# Patient Record
Sex: Male | Born: 1935 | Race: White | Hispanic: No | Marital: Married | Smoking: Former smoker
Health system: Southern US, Community
[De-identification: ages and names within clinical notes are randomized; demographics above are authoritative.]

## PROBLEM LIST (undated history)

## (undated) DIAGNOSIS — K449 Diaphragmatic hernia without obstruction or gangrene: Secondary | ICD-10-CM

## (undated) DIAGNOSIS — G4733 Obstructive sleep apnea (adult) (pediatric): Secondary | ICD-10-CM

## (undated) DIAGNOSIS — I509 Heart failure, unspecified: Secondary | ICD-10-CM

## (undated) DIAGNOSIS — I10 Essential (primary) hypertension: Secondary | ICD-10-CM

## (undated) DIAGNOSIS — M1990 Unspecified osteoarthritis, unspecified site: Secondary | ICD-10-CM

## (undated) DIAGNOSIS — R911 Solitary pulmonary nodule: Secondary | ICD-10-CM

## (undated) DIAGNOSIS — I712 Thoracic aortic aneurysm, without rupture: Secondary | ICD-10-CM

## (undated) DIAGNOSIS — I255 Ischemic cardiomyopathy: Secondary | ICD-10-CM

## (undated) DIAGNOSIS — N400 Enlarged prostate without lower urinary tract symptoms: Secondary | ICD-10-CM

## (undated) DIAGNOSIS — I480 Paroxysmal atrial fibrillation: Secondary | ICD-10-CM

## (undated) DIAGNOSIS — K5790 Diverticulosis of intestine, part unspecified, without perforation or abscess without bleeding: Secondary | ICD-10-CM

## (undated) DIAGNOSIS — J449 Chronic obstructive pulmonary disease, unspecified: Secondary | ICD-10-CM

## (undated) DIAGNOSIS — E785 Hyperlipidemia, unspecified: Secondary | ICD-10-CM

## (undated) DIAGNOSIS — D509 Iron deficiency anemia, unspecified: Secondary | ICD-10-CM

## (undated) DIAGNOSIS — I959 Hypotension, unspecified: Secondary | ICD-10-CM

## (undated) DIAGNOSIS — I219 Acute myocardial infarction, unspecified: Secondary | ICD-10-CM

## (undated) DIAGNOSIS — Z95810 Presence of automatic (implantable) cardiac defibrillator: Secondary | ICD-10-CM

## (undated) DIAGNOSIS — J189 Pneumonia, unspecified organism: Secondary | ICD-10-CM

## (undated) DIAGNOSIS — I724 Aneurysm of artery of lower extremity: Secondary | ICD-10-CM

## (undated) DIAGNOSIS — C61 Malignant neoplasm of prostate: Secondary | ICD-10-CM

## (undated) DIAGNOSIS — B029 Zoster without complications: Secondary | ICD-10-CM

## (undated) DIAGNOSIS — I2510 Atherosclerotic heart disease of native coronary artery without angina pectoris: Secondary | ICD-10-CM

## (undated) DIAGNOSIS — G5602 Carpal tunnel syndrome, left upper limb: Secondary | ICD-10-CM

## (undated) DIAGNOSIS — R0602 Shortness of breath: Secondary | ICD-10-CM

## (undated) DIAGNOSIS — D649 Anemia, unspecified: Secondary | ICD-10-CM

## (undated) DIAGNOSIS — E039 Hypothyroidism, unspecified: Secondary | ICD-10-CM

## (undated) DIAGNOSIS — N189 Chronic kidney disease, unspecified: Secondary | ICD-10-CM

## (undated) DIAGNOSIS — I714 Abdominal aortic aneurysm, without rupture, unspecified: Secondary | ICD-10-CM

## (undated) DIAGNOSIS — I4891 Unspecified atrial fibrillation: Secondary | ICD-10-CM

## (undated) DIAGNOSIS — K219 Gastro-esophageal reflux disease without esophagitis: Secondary | ICD-10-CM

## (undated) DIAGNOSIS — I739 Peripheral vascular disease, unspecified: Secondary | ICD-10-CM

## (undated) HISTORY — DX: Thoracic aortic aneurysm, without rupture: I71.2

## (undated) HISTORY — DX: Paroxysmal atrial fibrillation: I48.0

## (undated) HISTORY — DX: Gastro-esophageal reflux disease without esophagitis: K21.9

## (undated) HISTORY — DX: Benign prostatic hyperplasia without lower urinary tract symptoms: N40.0

## (undated) HISTORY — DX: Chronic obstructive pulmonary disease, unspecified: J44.9

## (undated) HISTORY — DX: Anemia, unspecified: D64.9

## (undated) HISTORY — DX: Zoster without complications: B02.9

## (undated) HISTORY — DX: Carpal tunnel syndrome, left upper limb: G56.02

## (undated) HISTORY — PX: OTHER SURGICAL HISTORY: SHX169

## (undated) HISTORY — DX: Chronic kidney disease, unspecified: N18.9

## (undated) HISTORY — DX: Peripheral vascular disease, unspecified: I73.9

## (undated) HISTORY — DX: Iron deficiency anemia, unspecified: D50.9

## (undated) HISTORY — DX: Essential (primary) hypertension: I10

## (undated) HISTORY — PX: NOSE SURGERY: SHX723

## (undated) HISTORY — DX: Diaphragmatic hernia without obstruction or gangrene: K44.9

## (undated) HISTORY — DX: Presence of automatic (implantable) cardiac defibrillator: Z95.810

## (undated) HISTORY — DX: Acute myocardial infarction, unspecified: I21.9

## (undated) HISTORY — DX: Abdominal aortic aneurysm, without rupture, unspecified: I71.40

## (undated) HISTORY — DX: Ischemic cardiomyopathy: I25.5

## (undated) HISTORY — DX: Obstructive sleep apnea (adult) (pediatric): G47.33

## (undated) HISTORY — DX: Aneurysm of artery of lower extremity: I72.4

## (undated) HISTORY — DX: Diverticulosis of intestine, part unspecified, without perforation or abscess without bleeding: K57.90

## (undated) HISTORY — DX: Hyperlipidemia, unspecified: E78.5

## (undated) HISTORY — PX: CORONARY ANGIOPLASTY: SHX604

## (undated) HISTORY — DX: Pneumonia, unspecified organism: J18.9

## (undated) HISTORY — DX: Solitary pulmonary nodule: R91.1

## (undated) HISTORY — DX: Atherosclerotic heart disease of native coronary artery without angina pectoris: I25.10

## (undated) HISTORY — DX: Malignant neoplasm of prostate: C61

## (undated) HISTORY — DX: Hypotension, unspecified: I95.9

## (undated) HISTORY — DX: Abdominal aortic aneurysm, without rupture: I71.4

---

## 1993-05-13 DIAGNOSIS — I712 Thoracic aortic aneurysm, without rupture, unspecified: Secondary | ICD-10-CM

## 1993-05-13 HISTORY — DX: Thoracic aortic aneurysm, without rupture: I71.2

## 1993-05-13 HISTORY — DX: Thoracic aortic aneurysm, without rupture, unspecified: I71.20

## 1993-05-13 HISTORY — PX: COLON SURGERY: SHX602

## 1994-05-13 HISTORY — PX: CORONARY ARTERY BYPASS GRAFT: SHX141

## 1997-05-13 HISTORY — PX: AORTA - BILATERAL FEMORAL ARTERY BYPASS GRAFT: SHX1175

## 1997-05-13 HISTORY — PX: ABDOMINAL AORTIC ANEURYSM REPAIR: SUR1152

## 1998-06-09 ENCOUNTER — Encounter: Payer: Self-pay | Admitting: Vascular Surgery

## 1998-06-09 ENCOUNTER — Ambulatory Visit (HOSPITAL_COMMUNITY): Admission: RE | Admit: 1998-06-09 | Discharge: 1998-06-09 | Payer: Self-pay | Admitting: Vascular Surgery

## 1998-07-03 ENCOUNTER — Encounter: Payer: Self-pay | Admitting: Vascular Surgery

## 1998-07-04 ENCOUNTER — Ambulatory Visit: Admission: RE | Admit: 1998-07-04 | Discharge: 1998-07-04 | Payer: Self-pay | Admitting: Vascular Surgery

## 1998-08-01 ENCOUNTER — Inpatient Hospital Stay: Admission: RE | Admit: 1998-08-01 | Discharge: 1998-08-06 | Payer: Self-pay | Admitting: Vascular Surgery

## 1998-08-01 ENCOUNTER — Encounter: Payer: Self-pay | Admitting: Vascular Surgery

## 1998-08-02 ENCOUNTER — Encounter: Payer: Self-pay | Admitting: Vascular Surgery

## 1999-11-10 ENCOUNTER — Encounter: Payer: Self-pay | Admitting: *Deleted

## 1999-11-10 ENCOUNTER — Inpatient Hospital Stay (HOSPITAL_COMMUNITY): Admission: EM | Admit: 1999-11-10 | Discharge: 1999-11-14 | Payer: Self-pay | Admitting: *Deleted

## 2001-01-01 ENCOUNTER — Inpatient Hospital Stay (HOSPITAL_COMMUNITY): Admission: EM | Admit: 2001-01-01 | Discharge: 2001-01-02 | Payer: Self-pay

## 2001-01-02 ENCOUNTER — Encounter: Payer: Self-pay | Admitting: Cardiovascular Disease

## 2001-08-08 ENCOUNTER — Encounter: Payer: Self-pay | Admitting: Emergency Medicine

## 2001-08-08 ENCOUNTER — Emergency Department (HOSPITAL_COMMUNITY): Admission: EM | Admit: 2001-08-08 | Discharge: 2001-08-08 | Payer: Self-pay

## 2004-05-13 HISTORY — PX: RADIOFREQUENCY ABLATION: SHX2290

## 2004-07-30 ENCOUNTER — Ambulatory Visit: Payer: Self-pay | Admitting: Cardiology

## 2004-07-30 ENCOUNTER — Inpatient Hospital Stay (HOSPITAL_COMMUNITY): Admission: EM | Admit: 2004-07-30 | Discharge: 2004-08-02 | Payer: Self-pay | Admitting: Emergency Medicine

## 2004-07-31 ENCOUNTER — Encounter: Payer: Self-pay | Admitting: Cardiology

## 2004-08-10 ENCOUNTER — Ambulatory Visit: Payer: Self-pay | Admitting: Cardiology

## 2004-08-10 ENCOUNTER — Inpatient Hospital Stay (HOSPITAL_COMMUNITY): Admission: AD | Admit: 2004-08-10 | Discharge: 2004-08-14 | Payer: Self-pay | Admitting: Cardiology

## 2004-08-27 ENCOUNTER — Inpatient Hospital Stay (HOSPITAL_COMMUNITY): Admission: EM | Admit: 2004-08-27 | Discharge: 2004-08-29 | Payer: Self-pay | Admitting: Emergency Medicine

## 2004-08-27 ENCOUNTER — Ambulatory Visit: Payer: Self-pay | Admitting: *Deleted

## 2004-08-28 HISTORY — PX: CARDIAC DEFIBRILLATOR PLACEMENT: SHX171

## 2004-09-13 ENCOUNTER — Ambulatory Visit: Payer: Self-pay | Admitting: Cardiology

## 2004-11-28 ENCOUNTER — Ambulatory Visit: Payer: Self-pay | Admitting: Internal Medicine

## 2004-12-03 ENCOUNTER — Ambulatory Visit: Payer: Self-pay | Admitting: Cardiology

## 2004-12-12 ENCOUNTER — Ambulatory Visit: Payer: Self-pay

## 2005-03-14 ENCOUNTER — Ambulatory Visit: Payer: Self-pay | Admitting: Cardiology

## 2005-04-08 ENCOUNTER — Ambulatory Visit: Payer: Self-pay | Admitting: Internal Medicine

## 2005-07-08 ENCOUNTER — Ambulatory Visit: Payer: Self-pay | Admitting: Cardiology

## 2005-07-30 ENCOUNTER — Ambulatory Visit: Payer: Self-pay | Admitting: Internal Medicine

## 2005-11-05 ENCOUNTER — Ambulatory Visit: Payer: Self-pay | Admitting: Internal Medicine

## 2006-01-16 ENCOUNTER — Ambulatory Visit: Payer: Self-pay | Admitting: Cardiology

## 2006-02-05 ENCOUNTER — Ambulatory Visit: Payer: Self-pay

## 2006-02-06 ENCOUNTER — Ambulatory Visit: Payer: Self-pay | Admitting: Internal Medicine

## 2006-03-14 ENCOUNTER — Ambulatory Visit: Payer: Self-pay

## 2006-07-16 ENCOUNTER — Ambulatory Visit: Payer: Self-pay | Admitting: Cardiology

## 2006-11-17 ENCOUNTER — Ambulatory Visit: Payer: Self-pay | Admitting: Vascular Surgery

## 2007-01-14 ENCOUNTER — Ambulatory Visit: Payer: Self-pay | Admitting: Cardiology

## 2007-02-19 ENCOUNTER — Ambulatory Visit: Payer: Self-pay | Admitting: Internal Medicine

## 2007-04-21 ENCOUNTER — Ambulatory Visit: Payer: Self-pay | Admitting: Internal Medicine

## 2007-05-14 DIAGNOSIS — J189 Pneumonia, unspecified organism: Secondary | ICD-10-CM

## 2007-05-14 HISTORY — DX: Pneumonia, unspecified organism: J18.9

## 2007-05-25 ENCOUNTER — Ambulatory Visit: Payer: Self-pay | Admitting: Internal Medicine

## 2007-05-25 ENCOUNTER — Inpatient Hospital Stay (HOSPITAL_COMMUNITY): Admission: EM | Admit: 2007-05-25 | Discharge: 2007-05-30 | Payer: Self-pay | Admitting: Emergency Medicine

## 2007-05-27 ENCOUNTER — Encounter: Payer: Self-pay | Admitting: Internal Medicine

## 2007-06-03 ENCOUNTER — Ambulatory Visit: Payer: Self-pay

## 2007-06-03 LAB — CONVERTED CEMR LAB
BUN: 26 mg/dL — ABNORMAL HIGH (ref 6–23)
Basophils Absolute: 0 10*3/uL (ref 0.0–0.1)
Basophils Relative: 0 % (ref 0.0–1.0)
CO2: 32 meq/L (ref 19–32)
Calcium: 9.4 mg/dL (ref 8.4–10.5)
GFR calc Af Amer: 60 mL/min
GFR calc non Af Amer: 49 mL/min
Lymphocytes Relative: 14.9 % (ref 12.0–46.0)
MCHC: 34.5 g/dL (ref 30.0–36.0)
Monocytes Relative: 9.3 % (ref 3.0–11.0)
Neutro Abs: 5.1 10*3/uL (ref 1.4–7.7)
Platelets: 203 10*3/uL (ref 150–400)
Potassium: 4.4 meq/L (ref 3.5–5.1)

## 2007-06-05 ENCOUNTER — Ambulatory Visit: Payer: Self-pay | Admitting: Internal Medicine

## 2007-06-12 ENCOUNTER — Ambulatory Visit: Payer: Self-pay | Admitting: Cardiology

## 2007-06-16 ENCOUNTER — Ambulatory Visit: Payer: Self-pay | Admitting: Cardiology

## 2007-06-16 LAB — CONVERTED CEMR LAB
BUN: 14 mg/dL (ref 6–23)
Basophils Relative: 0.1 % (ref 0.0–1.0)
CO2: 30 meq/L (ref 19–32)
Calcium: 9.1 mg/dL (ref 8.4–10.5)
Chloride: 105 meq/L (ref 96–112)
Creatinine, Ser: 1.3 mg/dL (ref 0.4–1.5)
Eosinophils Relative: 2.4 % (ref 0.0–5.0)
GFR calc Af Amer: 70 mL/min
Glucose, Bld: 147 mg/dL — ABNORMAL HIGH (ref 70–99)
INR: 2.8 — ABNORMAL HIGH (ref 0.8–1.0)
Lymphocytes Relative: 17.8 % (ref 12.0–46.0)
Monocytes Relative: 7.7 % (ref 3.0–11.0)
Platelets: 122 10*3/uL — ABNORMAL LOW (ref 150–400)
Prothrombin Time: 21.1 s — ABNORMAL HIGH (ref 10.9–13.3)
RBC: 3.84 M/uL — ABNORMAL LOW (ref 4.22–5.81)
RDW: 13.8 % (ref 11.5–14.6)
WBC: 4.3 10*3/uL — ABNORMAL LOW (ref 4.5–10.5)

## 2007-06-18 ENCOUNTER — Ambulatory Visit: Payer: Self-pay

## 2007-06-26 ENCOUNTER — Ambulatory Visit: Payer: Self-pay | Admitting: Vascular Surgery

## 2007-06-29 ENCOUNTER — Ambulatory Visit: Payer: Self-pay | Admitting: Cardiology

## 2007-06-29 LAB — CONVERTED CEMR LAB
Basophils Absolute: 0 10*3/uL (ref 0.0–0.1)
HCT: 36.3 % — ABNORMAL LOW (ref 39.0–52.0)
Hemoglobin: 11.9 g/dL — ABNORMAL LOW (ref 13.0–17.0)
Lymphocytes Relative: 17.5 % (ref 12.0–46.0)
MCHC: 32.8 g/dL (ref 30.0–36.0)
Monocytes Absolute: 0.4 10*3/uL (ref 0.2–0.7)
Monocytes Relative: 5.6 % (ref 3.0–11.0)
Neutro Abs: 5 10*3/uL (ref 1.4–7.7)
Neutrophils Relative %: 72.6 % (ref 43.0–77.0)
RDW: 13.6 % (ref 11.5–14.6)

## 2007-07-07 ENCOUNTER — Ambulatory Visit: Payer: Self-pay | Admitting: Cardiology

## 2007-08-10 ENCOUNTER — Ambulatory Visit: Payer: Self-pay | Admitting: Cardiology

## 2007-08-24 ENCOUNTER — Ambulatory Visit: Payer: Self-pay | Admitting: Internal Medicine

## 2007-09-02 ENCOUNTER — Ambulatory Visit: Payer: Self-pay | Admitting: Cardiology

## 2007-09-02 LAB — CONVERTED CEMR LAB
CO2: 34 meq/L — ABNORMAL HIGH (ref 19–32)
Calcium: 9.8 mg/dL (ref 8.4–10.5)
Creatinine, Ser: 1.3 mg/dL (ref 0.4–1.5)
GFR calc Af Amer: 70 mL/min
Glucose, Bld: 125 mg/dL — ABNORMAL HIGH (ref 70–99)
Sodium: 144 meq/L (ref 135–145)

## 2007-09-09 ENCOUNTER — Ambulatory Visit: Payer: Self-pay | Admitting: Cardiovascular Disease

## 2007-09-13 ENCOUNTER — Ambulatory Visit: Payer: Self-pay | Admitting: Pulmonary Disease

## 2007-09-13 ENCOUNTER — Ambulatory Visit: Payer: Self-pay | Admitting: Cardiovascular Disease

## 2007-09-13 ENCOUNTER — Inpatient Hospital Stay (HOSPITAL_COMMUNITY): Admission: EM | Admit: 2007-09-13 | Discharge: 2007-09-16 | Payer: Self-pay | Admitting: Emergency Medicine

## 2007-09-21 ENCOUNTER — Ambulatory Visit: Payer: Self-pay | Admitting: Cardiology

## 2007-09-30 ENCOUNTER — Ambulatory Visit: Payer: Self-pay | Admitting: Cardiology

## 2007-10-07 ENCOUNTER — Ambulatory Visit: Payer: Self-pay | Admitting: Cardiology

## 2007-10-14 ENCOUNTER — Ambulatory Visit: Payer: Self-pay | Admitting: Pulmonary Disease

## 2007-10-14 DIAGNOSIS — J449 Chronic obstructive pulmonary disease, unspecified: Secondary | ICD-10-CM

## 2007-10-14 DIAGNOSIS — I219 Acute myocardial infarction, unspecified: Secondary | ICD-10-CM | POA: Insufficient documentation

## 2007-10-14 DIAGNOSIS — I10 Essential (primary) hypertension: Secondary | ICD-10-CM | POA: Insufficient documentation

## 2007-10-14 DIAGNOSIS — J309 Allergic rhinitis, unspecified: Secondary | ICD-10-CM | POA: Insufficient documentation

## 2007-10-14 DIAGNOSIS — J4489 Other specified chronic obstructive pulmonary disease: Secondary | ICD-10-CM | POA: Insufficient documentation

## 2007-10-14 DIAGNOSIS — E785 Hyperlipidemia, unspecified: Secondary | ICD-10-CM | POA: Insufficient documentation

## 2007-10-28 ENCOUNTER — Ambulatory Visit: Payer: Self-pay | Admitting: Cardiology

## 2007-11-03 ENCOUNTER — Ambulatory Visit: Payer: Self-pay | Admitting: Internal Medicine

## 2007-11-18 ENCOUNTER — Ambulatory Visit: Payer: Self-pay | Admitting: Cardiovascular Disease

## 2007-12-03 ENCOUNTER — Ambulatory Visit: Payer: Self-pay | Admitting: Cardiology

## 2007-12-16 ENCOUNTER — Ambulatory Visit: Payer: Self-pay | Admitting: Internal Medicine

## 2008-01-06 ENCOUNTER — Ambulatory Visit: Payer: Self-pay | Admitting: Cardiology

## 2008-01-06 LAB — CONVERTED CEMR LAB
BUN: 26 mg/dL — ABNORMAL HIGH (ref 6–23)
Calcium: 9.2 mg/dL (ref 8.4–10.5)
GFR calc Af Amer: 65 mL/min
GFR calc non Af Amer: 53 mL/min
Glucose, Bld: 107 mg/dL — ABNORMAL HIGH (ref 70–99)

## 2008-01-13 ENCOUNTER — Ambulatory Visit: Payer: Self-pay | Admitting: Cardiology

## 2008-01-14 ENCOUNTER — Ambulatory Visit: Payer: Self-pay

## 2008-01-21 ENCOUNTER — Ambulatory Visit: Payer: Self-pay

## 2008-02-02 ENCOUNTER — Ambulatory Visit: Payer: Self-pay | Admitting: Cardiology

## 2008-02-24 ENCOUNTER — Ambulatory Visit: Payer: Self-pay | Admitting: Internal Medicine

## 2008-03-08 ENCOUNTER — Telehealth (INDEPENDENT_AMBULATORY_CARE_PROVIDER_SITE_OTHER): Payer: Self-pay | Admitting: *Deleted

## 2008-03-23 ENCOUNTER — Ambulatory Visit: Payer: Self-pay | Admitting: Internal Medicine

## 2008-03-28 ENCOUNTER — Ambulatory Visit: Payer: Self-pay | Admitting: Cardiology

## 2008-04-01 ENCOUNTER — Observation Stay (HOSPITAL_COMMUNITY): Admission: EM | Admit: 2008-04-01 | Discharge: 2008-04-02 | Payer: Self-pay | Admitting: Emergency Medicine

## 2008-04-01 ENCOUNTER — Ambulatory Visit: Payer: Self-pay | Admitting: Cardiology

## 2008-04-11 ENCOUNTER — Ambulatory Visit: Payer: Self-pay | Admitting: Cardiology

## 2008-04-11 ENCOUNTER — Ambulatory Visit: Payer: Self-pay

## 2008-04-11 LAB — CONVERTED CEMR LAB
BUN: 26 mg/dL — ABNORMAL HIGH (ref 6–23)
Calcium: 9.7 mg/dL (ref 8.4–10.5)
Creatinine, Ser: 1.2 mg/dL (ref 0.4–1.5)
GFR calc Af Amer: 77 mL/min
GFR calc non Af Amer: 64 mL/min

## 2008-04-13 ENCOUNTER — Ambulatory Visit: Payer: Self-pay | Admitting: Cardiology

## 2008-04-25 ENCOUNTER — Ambulatory Visit: Payer: Self-pay | Admitting: Cardiology

## 2008-05-09 ENCOUNTER — Ambulatory Visit: Payer: Self-pay | Admitting: Pulmonary Disease

## 2008-05-11 ENCOUNTER — Telehealth: Payer: Self-pay | Admitting: Pulmonary Disease

## 2008-05-11 ENCOUNTER — Ambulatory Visit: Payer: Self-pay | Admitting: Cardiology

## 2008-06-02 ENCOUNTER — Ambulatory Visit: Payer: Self-pay | Admitting: Cardiology

## 2008-06-07 ENCOUNTER — Encounter: Admission: RE | Admit: 2008-06-07 | Discharge: 2008-06-07 | Payer: Self-pay | Admitting: Endocrinology

## 2008-06-13 ENCOUNTER — Encounter (HOSPITAL_COMMUNITY): Admission: RE | Admit: 2008-06-13 | Discharge: 2008-09-11 | Payer: Self-pay | Admitting: Pulmonary Disease

## 2008-06-13 ENCOUNTER — Encounter: Payer: Self-pay | Admitting: Internal Medicine

## 2008-06-29 ENCOUNTER — Ambulatory Visit: Payer: Self-pay | Admitting: Cardiology

## 2008-07-08 ENCOUNTER — Ambulatory Visit: Payer: Self-pay | Admitting: Vascular Surgery

## 2008-07-11 DIAGNOSIS — C61 Malignant neoplasm of prostate: Secondary | ICD-10-CM

## 2008-07-11 HISTORY — DX: Malignant neoplasm of prostate: C61

## 2008-07-21 ENCOUNTER — Ambulatory Visit: Payer: Self-pay | Admitting: Cardiovascular Disease

## 2008-07-27 ENCOUNTER — Ambulatory Visit: Payer: Self-pay | Admitting: Cardiology

## 2008-08-03 ENCOUNTER — Encounter: Payer: Self-pay | Admitting: Pulmonary Disease

## 2008-08-03 ENCOUNTER — Ambulatory Visit: Payer: Self-pay | Admitting: Cardiology

## 2008-08-08 ENCOUNTER — Ambulatory Visit: Payer: Self-pay | Admitting: Internal Medicine

## 2008-08-17 ENCOUNTER — Ambulatory Visit: Payer: Self-pay | Admitting: Internal Medicine

## 2008-08-23 ENCOUNTER — Telehealth (INDEPENDENT_AMBULATORY_CARE_PROVIDER_SITE_OTHER): Payer: Self-pay | Admitting: *Deleted

## 2008-08-31 ENCOUNTER — Ambulatory Visit: Payer: Self-pay | Admitting: Cardiology

## 2008-09-02 DIAGNOSIS — Z95810 Presence of automatic (implantable) cardiac defibrillator: Secondary | ICD-10-CM

## 2008-09-02 DIAGNOSIS — I2589 Other forms of chronic ischemic heart disease: Secondary | ICD-10-CM

## 2008-09-02 DIAGNOSIS — I739 Peripheral vascular disease, unspecified: Secondary | ICD-10-CM

## 2008-09-02 DIAGNOSIS — E119 Type 2 diabetes mellitus without complications: Secondary | ICD-10-CM

## 2008-09-02 DIAGNOSIS — I501 Left ventricular failure: Secondary | ICD-10-CM

## 2008-09-02 HISTORY — DX: Presence of automatic (implantable) cardiac defibrillator: Z95.810

## 2008-09-05 ENCOUNTER — Ambulatory Visit: Payer: Self-pay | Admitting: Cardiology

## 2008-09-05 DIAGNOSIS — I714 Abdominal aortic aneurysm, without rupture, unspecified: Secondary | ICD-10-CM | POA: Insufficient documentation

## 2008-09-06 LAB — CONVERTED CEMR LAB
BUN: 25 mg/dL — ABNORMAL HIGH (ref 6–23)
Calcium: 9.6 mg/dL (ref 8.4–10.5)
Creatinine, Ser: 1.2 mg/dL (ref 0.4–1.5)
GFR calc non Af Amer: 63.53 mL/min (ref >60)
Magnesium: 2.3 mg/dL (ref 1.5–2.5)

## 2008-09-09 ENCOUNTER — Ambulatory Visit: Payer: Self-pay | Admitting: Pulmonary Disease

## 2008-09-12 ENCOUNTER — Encounter (HOSPITAL_COMMUNITY): Admission: RE | Admit: 2008-09-12 | Discharge: 2008-12-11 | Payer: Self-pay | Admitting: Pulmonary Disease

## 2008-09-21 ENCOUNTER — Ambulatory Visit: Payer: Self-pay | Admitting: Cardiology

## 2008-10-02 ENCOUNTER — Emergency Department (HOSPITAL_BASED_OUTPATIENT_CLINIC_OR_DEPARTMENT_OTHER): Admission: EM | Admit: 2008-10-02 | Discharge: 2008-10-02 | Payer: Self-pay | Admitting: Emergency Medicine

## 2008-10-02 ENCOUNTER — Ambulatory Visit: Payer: Self-pay | Admitting: Radiology

## 2008-10-04 ENCOUNTER — Encounter: Admission: RE | Admit: 2008-10-04 | Discharge: 2008-10-04 | Payer: Self-pay | Admitting: Endocrinology

## 2008-10-11 ENCOUNTER — Encounter: Payer: Self-pay | Admitting: *Deleted

## 2008-10-19 ENCOUNTER — Ambulatory Visit: Payer: Self-pay | Admitting: Internal Medicine

## 2008-10-19 LAB — CONVERTED CEMR LAB: POC INR: 1.9

## 2008-10-22 ENCOUNTER — Emergency Department (HOSPITAL_COMMUNITY): Admission: EM | Admit: 2008-10-22 | Discharge: 2008-10-22 | Payer: Self-pay | Admitting: Emergency Medicine

## 2008-10-31 ENCOUNTER — Encounter: Payer: Self-pay | Admitting: Pulmonary Disease

## 2008-11-01 ENCOUNTER — Ambulatory Visit: Payer: Self-pay | Admitting: Internal Medicine

## 2008-11-09 ENCOUNTER — Ambulatory Visit: Payer: Self-pay | Admitting: Cardiology

## 2008-11-09 ENCOUNTER — Encounter (INDEPENDENT_AMBULATORY_CARE_PROVIDER_SITE_OTHER): Payer: Self-pay | Admitting: Pharmacist

## 2008-11-16 ENCOUNTER — Encounter: Payer: Self-pay | Admitting: *Deleted

## 2008-11-21 ENCOUNTER — Telehealth (INDEPENDENT_AMBULATORY_CARE_PROVIDER_SITE_OTHER): Payer: Self-pay | Admitting: *Deleted

## 2008-11-23 ENCOUNTER — Ambulatory Visit: Payer: Self-pay | Admitting: Cardiology

## 2008-11-23 LAB — CONVERTED CEMR LAB: Prothrombin Time: 17.8 s

## 2008-11-24 ENCOUNTER — Telehealth: Payer: Self-pay | Admitting: Pulmonary Disease

## 2008-11-28 ENCOUNTER — Telehealth (INDEPENDENT_AMBULATORY_CARE_PROVIDER_SITE_OTHER): Payer: Self-pay | Admitting: *Deleted

## 2008-12-14 ENCOUNTER — Ambulatory Visit: Payer: Self-pay | Admitting: Cardiology

## 2008-12-14 LAB — CONVERTED CEMR LAB: POC INR: 1.8

## 2008-12-27 ENCOUNTER — Telehealth (INDEPENDENT_AMBULATORY_CARE_PROVIDER_SITE_OTHER): Payer: Self-pay | Admitting: *Deleted

## 2008-12-27 ENCOUNTER — Ambulatory Visit: Payer: Self-pay | Admitting: Pulmonary Disease

## 2008-12-27 ENCOUNTER — Ambulatory Visit: Payer: Self-pay | Admitting: Internal Medicine

## 2008-12-28 ENCOUNTER — Telehealth: Payer: Self-pay | Admitting: Cardiology

## 2008-12-30 ENCOUNTER — Ambulatory Visit: Payer: Self-pay | Admitting: Cardiology

## 2008-12-30 LAB — CONVERTED CEMR LAB
INR: 8.4
POC INR: 6.6
Prothrombin Time: 85.5 s
Prothrombin Time: 85.5 s (ref 9.1–11.7)

## 2009-01-05 ENCOUNTER — Ambulatory Visit: Payer: Self-pay | Admitting: Cardiovascular Disease

## 2009-01-11 ENCOUNTER — Ambulatory Visit: Payer: Self-pay | Admitting: Pulmonary Disease

## 2009-01-11 ENCOUNTER — Ambulatory Visit: Payer: Self-pay | Admitting: Cardiology

## 2009-01-11 ENCOUNTER — Telehealth: Payer: Self-pay | Admitting: Cardiology

## 2009-01-11 DIAGNOSIS — R490 Dysphonia: Secondary | ICD-10-CM | POA: Insufficient documentation

## 2009-01-16 ENCOUNTER — Inpatient Hospital Stay (HOSPITAL_COMMUNITY): Admission: EM | Admit: 2009-01-16 | Discharge: 2009-01-18 | Payer: Self-pay | Admitting: Emergency Medicine

## 2009-01-16 ENCOUNTER — Ambulatory Visit: Payer: Self-pay | Admitting: Internal Medicine

## 2009-01-18 ENCOUNTER — Encounter: Payer: Self-pay | Admitting: Internal Medicine

## 2009-01-19 ENCOUNTER — Telehealth: Payer: Self-pay | Admitting: Pulmonary Disease

## 2009-01-20 ENCOUNTER — Ambulatory Visit: Payer: Self-pay | Admitting: Internal Medicine

## 2009-01-20 LAB — CONVERTED CEMR LAB: POC INR: 1.5

## 2009-01-30 ENCOUNTER — Ambulatory Visit: Payer: Self-pay | Admitting: Cardiology

## 2009-02-02 ENCOUNTER — Ambulatory Visit: Payer: Self-pay

## 2009-02-06 ENCOUNTER — Encounter: Payer: Self-pay | Admitting: Cardiology

## 2009-02-15 ENCOUNTER — Encounter (INDEPENDENT_AMBULATORY_CARE_PROVIDER_SITE_OTHER): Payer: Self-pay | Admitting: *Deleted

## 2009-02-27 ENCOUNTER — Ambulatory Visit: Payer: Self-pay | Admitting: Internal Medicine

## 2009-02-28 ENCOUNTER — Ambulatory Visit: Payer: Self-pay | Admitting: Pulmonary Disease

## 2009-03-21 ENCOUNTER — Telehealth: Payer: Self-pay | Admitting: Pulmonary Disease

## 2009-03-27 ENCOUNTER — Ambulatory Visit: Payer: Self-pay | Admitting: Internal Medicine

## 2009-03-27 LAB — CONVERTED CEMR LAB: POC INR: 2.2

## 2009-03-28 ENCOUNTER — Encounter: Payer: Self-pay | Admitting: Cardiology

## 2009-04-04 ENCOUNTER — Ambulatory Visit: Payer: Self-pay | Admitting: Cardiology

## 2009-04-04 ENCOUNTER — Telehealth: Payer: Self-pay | Admitting: Cardiology

## 2009-04-05 ENCOUNTER — Encounter (INDEPENDENT_AMBULATORY_CARE_PROVIDER_SITE_OTHER): Payer: Self-pay

## 2009-04-05 ENCOUNTER — Telehealth: Payer: Self-pay | Admitting: Cardiology

## 2009-04-05 ENCOUNTER — Encounter: Payer: Self-pay | Admitting: Cardiology

## 2009-04-09 ENCOUNTER — Ambulatory Visit: Payer: Self-pay | Admitting: Internal Medicine

## 2009-04-10 ENCOUNTER — Ambulatory Visit: Payer: Self-pay | Admitting: Cardiology

## 2009-04-10 ENCOUNTER — Encounter: Payer: Self-pay | Admitting: Cardiology

## 2009-04-10 ENCOUNTER — Inpatient Hospital Stay (HOSPITAL_COMMUNITY): Admission: EM | Admit: 2009-04-10 | Discharge: 2009-04-16 | Payer: Self-pay | Admitting: Emergency Medicine

## 2009-04-11 ENCOUNTER — Encounter: Payer: Self-pay | Admitting: Cardiology

## 2009-04-12 ENCOUNTER — Ambulatory Visit: Payer: Self-pay | Admitting: Vascular Surgery

## 2009-04-12 ENCOUNTER — Encounter: Payer: Self-pay | Admitting: Cardiology

## 2009-04-13 ENCOUNTER — Encounter: Payer: Self-pay | Admitting: Internal Medicine

## 2009-04-14 ENCOUNTER — Encounter: Payer: Self-pay | Admitting: Cardiology

## 2009-04-18 ENCOUNTER — Encounter: Payer: Self-pay | Admitting: Internal Medicine

## 2009-04-18 ENCOUNTER — Ambulatory Visit: Payer: Self-pay

## 2009-04-28 ENCOUNTER — Inpatient Hospital Stay (HOSPITAL_COMMUNITY): Admission: EM | Admit: 2009-04-28 | Discharge: 2009-04-29 | Payer: Self-pay | Admitting: Emergency Medicine

## 2009-04-28 ENCOUNTER — Ambulatory Visit: Payer: Self-pay | Admitting: Cardiology

## 2009-04-29 ENCOUNTER — Encounter: Payer: Self-pay | Admitting: Cardiovascular Disease

## 2009-04-29 ENCOUNTER — Encounter: Payer: Self-pay | Admitting: Cardiology

## 2009-05-01 ENCOUNTER — Ambulatory Visit: Payer: Self-pay | Admitting: Pulmonary Disease

## 2009-05-03 ENCOUNTER — Ambulatory Visit: Payer: Self-pay | Admitting: Internal Medicine

## 2009-05-03 LAB — CONVERTED CEMR LAB: POC INR: 3.4

## 2009-05-11 ENCOUNTER — Ambulatory Visit: Payer: Self-pay | Admitting: Cardiovascular Disease

## 2009-05-11 LAB — CONVERTED CEMR LAB: POC INR: 3.4

## 2009-05-15 ENCOUNTER — Encounter: Payer: Self-pay | Admitting: Family Medicine

## 2009-05-15 ENCOUNTER — Ambulatory Visit: Payer: Self-pay | Admitting: Cardiology

## 2009-05-17 ENCOUNTER — Encounter: Payer: Self-pay | Admitting: Cardiology

## 2009-05-18 ENCOUNTER — Encounter: Payer: Self-pay | Admitting: Cardiology

## 2009-05-25 ENCOUNTER — Ambulatory Visit: Payer: Self-pay | Admitting: Cardiology

## 2009-05-29 ENCOUNTER — Encounter: Payer: Self-pay | Admitting: Cardiology

## 2009-06-01 ENCOUNTER — Ambulatory Visit: Payer: Self-pay | Admitting: Pulmonary Disease

## 2009-06-06 ENCOUNTER — Telehealth: Payer: Self-pay | Admitting: Cardiology

## 2009-06-06 ENCOUNTER — Inpatient Hospital Stay (HOSPITAL_COMMUNITY): Admission: EM | Admit: 2009-06-06 | Discharge: 2009-06-07 | Payer: Self-pay | Admitting: Emergency Medicine

## 2009-06-06 ENCOUNTER — Ambulatory Visit: Payer: Self-pay | Admitting: Cardiology

## 2009-06-09 ENCOUNTER — Ambulatory Visit: Payer: Self-pay | Admitting: Cardiology

## 2009-06-09 DIAGNOSIS — I712 Thoracic aortic aneurysm, without rupture, unspecified: Secondary | ICD-10-CM | POA: Insufficient documentation

## 2009-06-14 ENCOUNTER — Ambulatory Visit (HOSPITAL_BASED_OUTPATIENT_CLINIC_OR_DEPARTMENT_OTHER): Admission: RE | Admit: 2009-06-14 | Discharge: 2009-06-14 | Payer: Self-pay | Admitting: Pulmonary Disease

## 2009-06-14 ENCOUNTER — Encounter: Payer: Self-pay | Admitting: Pulmonary Disease

## 2009-06-14 DIAGNOSIS — G4733 Obstructive sleep apnea (adult) (pediatric): Secondary | ICD-10-CM

## 2009-06-14 HISTORY — DX: Obstructive sleep apnea (adult) (pediatric): G47.33

## 2009-06-20 ENCOUNTER — Ambulatory Visit: Payer: Self-pay | Admitting: Cardiology

## 2009-06-22 ENCOUNTER — Ambulatory Visit: Payer: Self-pay | Admitting: Cardiology

## 2009-06-22 ENCOUNTER — Encounter (INDEPENDENT_AMBULATORY_CARE_PROVIDER_SITE_OTHER): Payer: Self-pay | Admitting: Cardiology

## 2009-06-22 LAB — CONVERTED CEMR LAB: POC INR: 2.2

## 2009-06-29 ENCOUNTER — Telehealth (INDEPENDENT_AMBULATORY_CARE_PROVIDER_SITE_OTHER): Payer: Self-pay | Admitting: *Deleted

## 2009-06-29 ENCOUNTER — Ambulatory Visit: Payer: Self-pay | Admitting: Pulmonary Disease

## 2009-06-30 ENCOUNTER — Encounter: Payer: Self-pay | Admitting: Cardiology

## 2009-07-04 ENCOUNTER — Ambulatory Visit: Payer: Self-pay | Admitting: Pulmonary Disease

## 2009-07-11 ENCOUNTER — Encounter: Admission: RE | Admit: 2009-07-11 | Discharge: 2009-07-11 | Payer: Self-pay | Admitting: Endocrinology

## 2009-07-13 ENCOUNTER — Telehealth (INDEPENDENT_AMBULATORY_CARE_PROVIDER_SITE_OTHER): Payer: Self-pay | Admitting: *Deleted

## 2009-07-20 ENCOUNTER — Ambulatory Visit: Payer: Self-pay | Admitting: Cardiology

## 2009-07-20 ENCOUNTER — Ambulatory Visit: Payer: Self-pay

## 2009-07-20 LAB — CONVERTED CEMR LAB: POC INR: 3.6

## 2009-07-21 ENCOUNTER — Encounter: Payer: Self-pay | Admitting: Cardiology

## 2009-07-25 ENCOUNTER — Encounter: Payer: Self-pay | Admitting: Pulmonary Disease

## 2009-07-25 ENCOUNTER — Telehealth (INDEPENDENT_AMBULATORY_CARE_PROVIDER_SITE_OTHER): Payer: Self-pay | Admitting: *Deleted

## 2009-07-31 ENCOUNTER — Telehealth (INDEPENDENT_AMBULATORY_CARE_PROVIDER_SITE_OTHER): Payer: Self-pay | Admitting: *Deleted

## 2009-07-31 ENCOUNTER — Encounter: Payer: Self-pay | Admitting: Cardiology

## 2009-08-01 ENCOUNTER — Ambulatory Visit: Payer: Self-pay | Admitting: Pulmonary Disease

## 2009-08-03 ENCOUNTER — Telehealth (INDEPENDENT_AMBULATORY_CARE_PROVIDER_SITE_OTHER): Payer: Self-pay | Admitting: *Deleted

## 2009-08-07 ENCOUNTER — Encounter: Payer: Self-pay | Admitting: Family Medicine

## 2009-08-09 ENCOUNTER — Ambulatory Visit: Payer: Self-pay

## 2009-08-09 ENCOUNTER — Ambulatory Visit: Payer: Self-pay | Admitting: Cardiology

## 2009-08-11 ENCOUNTER — Ambulatory Visit: Payer: Self-pay | Admitting: Vascular Surgery

## 2009-08-11 ENCOUNTER — Encounter: Admission: RE | Admit: 2009-08-11 | Discharge: 2009-08-11 | Payer: Self-pay | Admitting: Vascular Surgery

## 2009-08-14 ENCOUNTER — Telehealth (INDEPENDENT_AMBULATORY_CARE_PROVIDER_SITE_OTHER): Payer: Self-pay | Admitting: *Deleted

## 2009-08-31 ENCOUNTER — Encounter: Payer: Self-pay | Admitting: Cardiology

## 2009-09-04 ENCOUNTER — Encounter: Payer: Self-pay | Admitting: Pulmonary Disease

## 2009-09-05 ENCOUNTER — Encounter: Payer: Self-pay | Admitting: Pulmonary Disease

## 2009-09-06 ENCOUNTER — Encounter: Payer: Self-pay | Admitting: Cardiology

## 2009-09-07 ENCOUNTER — Encounter: Payer: Self-pay | Admitting: Cardiology

## 2009-09-20 ENCOUNTER — Encounter: Payer: Self-pay | Admitting: Pulmonary Disease

## 2009-09-29 ENCOUNTER — Encounter (INDEPENDENT_AMBULATORY_CARE_PROVIDER_SITE_OTHER): Payer: Self-pay | Admitting: Pharmacist

## 2009-09-29 ENCOUNTER — Telehealth: Payer: Self-pay | Admitting: Cardiology

## 2009-10-01 ENCOUNTER — Encounter: Payer: Self-pay | Admitting: Cardiology

## 2009-10-02 ENCOUNTER — Ambulatory Visit: Payer: Self-pay | Admitting: Pulmonary Disease

## 2009-10-04 ENCOUNTER — Ambulatory Visit: Payer: Self-pay | Admitting: Internal Medicine

## 2009-10-16 ENCOUNTER — Ambulatory Visit: Payer: Self-pay

## 2009-10-30 ENCOUNTER — Encounter: Admission: RE | Admit: 2009-10-30 | Discharge: 2009-10-30 | Payer: Self-pay | Admitting: Endocrinology

## 2009-11-01 ENCOUNTER — Ambulatory Visit: Payer: Self-pay | Admitting: Cardiovascular Disease

## 2009-11-01 ENCOUNTER — Encounter: Payer: Self-pay | Admitting: Cardiology

## 2009-11-01 ENCOUNTER — Encounter (INDEPENDENT_AMBULATORY_CARE_PROVIDER_SITE_OTHER): Payer: Self-pay | Admitting: *Deleted

## 2009-11-01 ENCOUNTER — Inpatient Hospital Stay (HOSPITAL_COMMUNITY): Admission: EM | Admit: 2009-11-01 | Discharge: 2009-11-10 | Payer: Self-pay | Admitting: Emergency Medicine

## 2009-11-08 ENCOUNTER — Encounter: Payer: Self-pay | Admitting: Cardiology

## 2009-11-09 ENCOUNTER — Encounter: Payer: Self-pay | Admitting: Cardiology

## 2009-11-14 ENCOUNTER — Emergency Department (HOSPITAL_BASED_OUTPATIENT_CLINIC_OR_DEPARTMENT_OTHER): Admission: EM | Admit: 2009-11-14 | Discharge: 2009-11-14 | Payer: Self-pay | Admitting: Emergency Medicine

## 2009-11-15 ENCOUNTER — Encounter: Payer: Self-pay | Admitting: Cardiology

## 2009-11-17 ENCOUNTER — Ambulatory Visit: Payer: Self-pay | Admitting: Cardiology

## 2009-11-17 LAB — CONVERTED CEMR LAB: POC INR: 1.7

## 2009-11-21 ENCOUNTER — Ambulatory Visit: Payer: Self-pay | Admitting: Cardiology

## 2009-12-01 ENCOUNTER — Telehealth (INDEPENDENT_AMBULATORY_CARE_PROVIDER_SITE_OTHER): Payer: Self-pay | Admitting: *Deleted

## 2009-12-01 ENCOUNTER — Ambulatory Visit: Payer: Self-pay | Admitting: Cardiology

## 2009-12-01 LAB — CONVERTED CEMR LAB: POC INR: 1.8

## 2009-12-02 ENCOUNTER — Encounter: Payer: Self-pay | Admitting: Cardiology

## 2009-12-05 LAB — CONVERTED CEMR LAB
BUN: 26 mg/dL — ABNORMAL HIGH (ref 6–23)
CO2: 30 meq/L (ref 19–32)
Calcium: 9.1 mg/dL (ref 8.4–10.5)
Chloride: 102 meq/L (ref 96–112)
Creatinine, Ser: 1.5 mg/dL (ref 0.4–1.5)
GFR calc non Af Amer: 48.94 mL/min (ref >60)
Glucose, Bld: 77 mg/dL (ref 70–99)
Potassium: 3.9 meq/L (ref 3.5–5.1)
Sodium: 141 meq/L (ref 135–145)

## 2009-12-14 ENCOUNTER — Ambulatory Visit: Payer: Self-pay | Admitting: Cardiovascular Disease

## 2009-12-14 LAB — CONVERTED CEMR LAB: POC INR: 2.7

## 2009-12-19 ENCOUNTER — Encounter: Payer: Self-pay | Admitting: Family Medicine

## 2009-12-29 ENCOUNTER — Ambulatory Visit: Payer: Self-pay | Admitting: Cardiology

## 2010-01-01 ENCOUNTER — Ambulatory Visit: Payer: Self-pay | Admitting: Pulmonary Disease

## 2010-01-01 ENCOUNTER — Telehealth: Payer: Self-pay | Admitting: Cardiology

## 2010-01-01 ENCOUNTER — Encounter: Payer: Self-pay | Admitting: Cardiology

## 2010-01-04 ENCOUNTER — Ambulatory Visit: Payer: Self-pay | Admitting: Internal Medicine

## 2010-01-09 ENCOUNTER — Ambulatory Visit: Payer: Self-pay | Admitting: Cardiovascular Disease

## 2010-01-11 LAB — CONVERTED CEMR LAB
CO2: 32 meq/L (ref 19–32)
Chloride: 102 meq/L (ref 96–112)
Creatinine, Ser: 1.5 mg/dL (ref 0.4–1.5)
Potassium: 3.9 meq/L (ref 3.5–5.1)
Sodium: 142 meq/L (ref 135–145)

## 2010-01-12 ENCOUNTER — Telehealth (INDEPENDENT_AMBULATORY_CARE_PROVIDER_SITE_OTHER): Payer: Self-pay | Admitting: *Deleted

## 2010-01-17 ENCOUNTER — Ambulatory Visit: Payer: Self-pay

## 2010-01-17 ENCOUNTER — Telehealth (INDEPENDENT_AMBULATORY_CARE_PROVIDER_SITE_OTHER): Payer: Self-pay | Admitting: *Deleted

## 2010-01-17 ENCOUNTER — Encounter: Payer: Self-pay | Admitting: Pulmonary Disease

## 2010-01-19 ENCOUNTER — Telehealth: Payer: Self-pay | Admitting: Pulmonary Disease

## 2010-02-01 ENCOUNTER — Ambulatory Visit: Payer: Self-pay | Admitting: Cardiology

## 2010-02-01 ENCOUNTER — Encounter: Payer: Self-pay | Admitting: Cardiology

## 2010-02-05 ENCOUNTER — Telehealth (INDEPENDENT_AMBULATORY_CARE_PROVIDER_SITE_OTHER): Payer: Self-pay | Admitting: *Deleted

## 2010-02-08 ENCOUNTER — Ambulatory Visit: Payer: Self-pay | Admitting: Cardiology

## 2010-02-08 LAB — CONVERTED CEMR LAB: POC INR: 2.2

## 2010-02-14 ENCOUNTER — Telehealth: Payer: Self-pay | Admitting: Cardiology

## 2010-03-01 ENCOUNTER — Ambulatory Visit: Payer: Self-pay | Admitting: Cardiovascular Disease

## 2010-03-02 ENCOUNTER — Ambulatory Visit: Payer: Self-pay | Admitting: Pulmonary Disease

## 2010-03-02 DIAGNOSIS — R911 Solitary pulmonary nodule: Secondary | ICD-10-CM

## 2010-03-02 HISTORY — DX: Solitary pulmonary nodule: R91.1

## 2010-03-04 ENCOUNTER — Encounter: Payer: Self-pay | Admitting: Cardiology

## 2010-03-08 ENCOUNTER — Ambulatory Visit: Payer: Self-pay | Admitting: Cardiology

## 2010-03-08 LAB — CONVERTED CEMR LAB: POC INR: 2.5

## 2010-03-14 ENCOUNTER — Ambulatory Visit: Payer: Self-pay | Admitting: Cardiology

## 2010-03-15 ENCOUNTER — Telehealth: Payer: Self-pay | Admitting: Cardiology

## 2010-03-15 ENCOUNTER — Encounter: Payer: Self-pay | Admitting: Cardiology

## 2010-03-15 ENCOUNTER — Telehealth (INDEPENDENT_AMBULATORY_CARE_PROVIDER_SITE_OTHER): Payer: Self-pay | Admitting: *Deleted

## 2010-03-19 ENCOUNTER — Telehealth (INDEPENDENT_AMBULATORY_CARE_PROVIDER_SITE_OTHER): Payer: Self-pay | Admitting: *Deleted

## 2010-03-22 ENCOUNTER — Ambulatory Visit: Payer: Self-pay | Admitting: Cardiology

## 2010-03-28 ENCOUNTER — Telehealth: Payer: Self-pay | Admitting: Cardiology

## 2010-03-29 LAB — CONVERTED CEMR LAB
Chloride: 105 meq/L (ref 96–112)
Potassium: 4.7 meq/L (ref 3.5–5.1)
Pro B Natriuretic peptide (BNP): 324.9 pg/mL — ABNORMAL HIGH (ref 0.0–100.0)
Sodium: 140 meq/L (ref 135–145)

## 2010-04-03 ENCOUNTER — Encounter: Payer: Self-pay | Admitting: Cardiology

## 2010-04-03 ENCOUNTER — Ambulatory Visit: Payer: Self-pay | Admitting: Vascular Surgery

## 2010-04-04 ENCOUNTER — Encounter: Payer: Self-pay | Admitting: Cardiology

## 2010-04-09 ENCOUNTER — Ambulatory Visit: Payer: Self-pay | Admitting: Internal Medicine

## 2010-04-09 LAB — CONVERTED CEMR LAB: POC INR: 2.3

## 2010-04-11 ENCOUNTER — Ambulatory Visit: Payer: Self-pay | Admitting: Pulmonary Disease

## 2010-04-11 ENCOUNTER — Telehealth (INDEPENDENT_AMBULATORY_CARE_PROVIDER_SITE_OTHER): Payer: Self-pay | Admitting: *Deleted

## 2010-04-16 ENCOUNTER — Ambulatory Visit: Payer: Self-pay | Admitting: Cardiology

## 2010-04-16 ENCOUNTER — Ambulatory Visit: Payer: Self-pay | Admitting: Family Medicine

## 2010-04-16 ENCOUNTER — Encounter: Payer: Self-pay | Admitting: Cardiology

## 2010-04-18 ENCOUNTER — Encounter: Payer: Self-pay | Admitting: Family Medicine

## 2010-04-24 ENCOUNTER — Ambulatory Visit: Payer: Self-pay | Admitting: Internal Medicine

## 2010-05-04 ENCOUNTER — Encounter: Payer: Self-pay | Admitting: Cardiology

## 2010-05-08 ENCOUNTER — Ambulatory Visit
Admission: RE | Admit: 2010-05-08 | Discharge: 2010-05-08 | Payer: Self-pay | Source: Home / Self Care | Attending: Family Medicine | Admitting: Family Medicine

## 2010-05-08 ENCOUNTER — Ambulatory Visit: Payer: Self-pay | Admitting: Cardiology

## 2010-05-08 DIAGNOSIS — E039 Hypothyroidism, unspecified: Secondary | ICD-10-CM | POA: Insufficient documentation

## 2010-05-08 DIAGNOSIS — D510 Vitamin B12 deficiency anemia due to intrinsic factor deficiency: Secondary | ICD-10-CM

## 2010-05-08 LAB — CONVERTED CEMR LAB: POC INR: 3.1

## 2010-05-11 ENCOUNTER — Ambulatory Visit
Admission: RE | Admit: 2010-05-11 | Discharge: 2010-05-11 | Payer: Self-pay | Source: Home / Self Care | Attending: Family Medicine | Admitting: Family Medicine

## 2010-05-17 ENCOUNTER — Other Ambulatory Visit: Payer: Self-pay | Admitting: Family Medicine

## 2010-05-17 ENCOUNTER — Ambulatory Visit
Admission: RE | Admit: 2010-05-17 | Discharge: 2010-05-17 | Payer: Self-pay | Source: Home / Self Care | Attending: Family Medicine | Admitting: Family Medicine

## 2010-05-17 LAB — CBC WITH DIFFERENTIAL/PLATELET
Basophils Absolute: 0 10*3/uL (ref 0.0–0.1)
Basophils Relative: 0.1 % (ref 0.0–3.0)
Eosinophils Absolute: 0.1 10*3/uL (ref 0.0–0.7)
Eosinophils Relative: 0.9 % (ref 0.0–5.0)
HCT: 35.2 % — ABNORMAL LOW (ref 39.0–52.0)
Hemoglobin: 11.6 g/dL — ABNORMAL LOW (ref 13.0–17.0)
Lymphocytes Relative: 6.7 % — ABNORMAL LOW (ref 12.0–46.0)
Lymphs Abs: 0.7 10*3/uL (ref 0.7–4.0)
MCHC: 32.8 g/dL (ref 30.0–36.0)
MCV: 85.6 fl (ref 78.0–100.0)
Monocytes Absolute: 0.7 10*3/uL (ref 0.1–1.0)
Monocytes Relative: 6.4 % (ref 3.0–12.0)
Neutro Abs: 8.9 10*3/uL — ABNORMAL HIGH (ref 1.4–7.7)
Neutrophils Relative %: 85.9 % — ABNORMAL HIGH (ref 43.0–77.0)
Platelets: 142 10*3/uL — ABNORMAL LOW (ref 150.0–400.0)
RBC: 4.12 Mil/uL — ABNORMAL LOW (ref 4.22–5.81)
RDW: 18.3 % — ABNORMAL HIGH (ref 11.5–14.6)
WBC: 10.4 10*3/uL (ref 4.5–10.5)

## 2010-05-17 LAB — BASIC METABOLIC PANEL
BUN: 22 mg/dL (ref 6–23)
CO2: 31 mEq/L (ref 19–32)
Calcium: 9.2 mg/dL (ref 8.4–10.5)
Chloride: 99 mEq/L (ref 96–112)
Creatinine, Ser: 1.4 mg/dL (ref 0.4–1.5)
GFR: 52.92 mL/min — ABNORMAL LOW (ref >60.00)
Glucose, Bld: 120 mg/dL — ABNORMAL HIGH (ref 70–99)
Potassium: 4.2 mEq/L (ref 3.5–5.1)
Sodium: 136 mEq/L (ref 135–145)

## 2010-05-17 LAB — LIPID PANEL
Cholesterol: 166 mg/dL (ref 0–200)
HDL: 46.1 mg/dL (ref >39.00)
LDL Cholesterol: 101 mg/dL — ABNORMAL HIGH (ref 0–99)
Total CHOL/HDL Ratio: 4
Triglycerides: 95 mg/dL (ref 0.0–149.0)
VLDL: 19 mg/dL (ref 0.0–40.0)

## 2010-05-17 LAB — HEPATIC FUNCTION PANEL
ALT: 19 U/L (ref 0–53)
AST: 15 U/L (ref 0–37)
Albumin: 3.4 g/dL — ABNORMAL LOW (ref 3.5–5.2)
Alkaline Phosphatase: 64 U/L (ref 39–117)
Bilirubin, Direct: 0.2 mg/dL (ref 0.0–0.3)
Total Bilirubin: 1.2 mg/dL (ref 0.3–1.2)
Total Protein: 6.3 g/dL (ref 6.0–8.3)

## 2010-05-17 LAB — HEMOGLOBIN A1C: Hgb A1c MFr Bld: 6.4 % (ref 4.6–6.5)

## 2010-05-18 ENCOUNTER — Emergency Department (HOSPITAL_BASED_OUTPATIENT_CLINIC_OR_DEPARTMENT_OTHER)
Admission: EM | Admit: 2010-05-18 | Discharge: 2010-05-18 | Payer: Self-pay | Source: Home / Self Care | Admitting: Emergency Medicine

## 2010-05-18 LAB — DIFFERENTIAL
Basophils Absolute: 0 10*3/uL (ref 0.0–0.1)
Basophils Relative: 0 % (ref 0–1)
Eosinophils Absolute: 0 10*3/uL (ref 0.0–0.7)
Eosinophils Relative: 0 % (ref 0–5)
Lymphocytes Relative: 5 % — ABNORMAL LOW (ref 12–46)
Lymphs Abs: 0.6 10*3/uL — ABNORMAL LOW (ref 0.7–4.0)
Monocytes Absolute: 0.9 10*3/uL (ref 0.1–1.0)
Monocytes Relative: 8 % (ref 3–12)
Neutro Abs: 10.7 10*3/uL — ABNORMAL HIGH (ref 1.7–7.7)
Neutrophils Relative %: 87 % — ABNORMAL HIGH (ref 43–77)

## 2010-05-18 LAB — POCT CARDIAC MARKERS
CKMB, poc: <1 ng/mL — ABNORMAL LOW (ref 1.0–8.0)
CKMB, poc: <1 ng/mL — ABNORMAL LOW (ref 1.0–8.0)
Myoglobin, poc: 88.4 ng/mL (ref 12–200)
Myoglobin, poc: 81.1 ng/mL (ref 12–200)
Troponin i, poc: <0.05 ng/mL (ref 0.00–0.09)
Troponin i, poc: <0.05 ng/mL (ref 0.00–0.09)

## 2010-05-18 LAB — BASIC METABOLIC PANEL
BUN: 19 mg/dL (ref 6–23)
CO2: 27 mEq/L (ref 19–32)
Calcium: 9.3 mg/dL (ref 8.4–10.5)
Chloride: 102 mEq/L (ref 96–112)
Creatinine, Ser: 1.1 mg/dL (ref 0.4–1.5)
GFR calc Af Amer: >60 mL/min (ref >60)
GFR calc non Af Amer: >60 mL/min (ref >60)
Glucose, Bld: 153 mg/dL — ABNORMAL HIGH (ref 70–99)
Potassium: 4.4 mEq/L (ref 3.5–5.1)
Sodium: 140 mEq/L (ref 135–145)

## 2010-05-18 LAB — CBC
HCT: 35 % — ABNORMAL LOW (ref 39.0–52.0)
Hemoglobin: 11 g/dL — ABNORMAL LOW (ref 13.0–17.0)
MCH: 27 pg (ref 26.0–34.0)
MCHC: 31.4 g/dL (ref 30.0–36.0)
MCV: 85.8 fL (ref 78.0–100.0)
Platelets: 140 10*3/uL — ABNORMAL LOW (ref 150–400)
RBC: 4.08 MIL/uL — ABNORMAL LOW (ref 4.22–5.81)
RDW: 17.8 % — ABNORMAL HIGH (ref 11.5–15.5)
WBC: 12.2 10*3/uL — ABNORMAL HIGH (ref 4.0–10.5)

## 2010-05-18 LAB — PROTIME-INR
INR: 2.25 — ABNORMAL HIGH (ref 0.00–1.49)
Prothrombin Time: 25 seconds — ABNORMAL HIGH (ref 11.6–15.2)

## 2010-05-18 LAB — URINALYSIS, ROUTINE W REFLEX MICROSCOPIC
Bilirubin Urine: NEGATIVE
Hemoglobin, Urine: NEGATIVE
Ketones, ur: NEGATIVE mg/dL
Nitrite: NEGATIVE
Protein, ur: NEGATIVE mg/dL
Specific Gravity, Urine: 1.007 (ref 1.005–1.030)
Urine Glucose, Fasting: NEGATIVE mg/dL
Urobilinogen, UA: 0.2 mg/dL (ref 0.0–1.0)
pH: 7.5 (ref 5.0–8.0)

## 2010-05-18 LAB — POCT B-TYPE NATRIURETIC PEPTIDE (BNP): B Natriuretic Peptide, POC: 541 pg/mL — ABNORMAL HIGH (ref 0–100)

## 2010-05-18 LAB — APTT: aPTT: 53 seconds — ABNORMAL HIGH (ref 24–37)

## 2010-05-21 ENCOUNTER — Ambulatory Visit
Admission: RE | Admit: 2010-05-21 | Discharge: 2010-05-21 | Payer: Self-pay | Source: Home / Self Care | Attending: Pulmonary Disease | Admitting: Pulmonary Disease

## 2010-05-21 ENCOUNTER — Telehealth: Payer: Self-pay | Admitting: Family Medicine

## 2010-05-21 LAB — TSH: TSH: 0.5 u[IU]/mL (ref 0.35–5.50)

## 2010-05-30 ENCOUNTER — Encounter: Payer: Self-pay | Admitting: Family Medicine

## 2010-06-01 ENCOUNTER — Other Ambulatory Visit: Payer: Self-pay | Admitting: Family Medicine

## 2010-06-01 ENCOUNTER — Ambulatory Visit
Admission: RE | Admit: 2010-06-01 | Discharge: 2010-06-01 | Payer: Self-pay | Source: Home / Self Care | Attending: Family Medicine | Admitting: Family Medicine

## 2010-06-01 ENCOUNTER — Ambulatory Visit: Admission: RE | Admit: 2010-06-01 | Discharge: 2010-06-01 | Payer: Self-pay | Source: Home / Self Care

## 2010-06-01 ENCOUNTER — Telehealth: Payer: Self-pay | Admitting: Family Medicine

## 2010-06-01 DIAGNOSIS — D649 Anemia, unspecified: Secondary | ICD-10-CM | POA: Insufficient documentation

## 2010-06-01 HISTORY — DX: Anemia, unspecified: D64.9

## 2010-06-01 LAB — IBC PANEL
Iron: 66 ug/dL (ref 42–165)
Saturation Ratios: 16.8 % — ABNORMAL LOW (ref 20.0–50.0)
Transferrin: 280.1 mg/dL (ref 212.0–360.0)

## 2010-06-01 LAB — HEPATIC FUNCTION PANEL
ALT: 22 U/L (ref 0–53)
AST: 19 U/L (ref 0–37)
Albumin: 3.5 g/dL (ref 3.5–5.2)
Alkaline Phosphatase: 58 U/L (ref 39–117)
Bilirubin, Direct: 0.2 mg/dL (ref 0.0–0.3)
Total Bilirubin: 0.6 mg/dL (ref 0.3–1.2)
Total Protein: 6.3 g/dL (ref 6.0–8.3)

## 2010-06-01 LAB — CBC WITH DIFFERENTIAL/PLATELET
Basophils Absolute: 0 10*3/uL (ref 0.0–0.1)
Basophils Relative: 0.3 % (ref 0.0–3.0)
Eosinophils Absolute: 0.1 10*3/uL (ref 0.0–0.7)
Eosinophils Relative: 1.1 % (ref 0.0–5.0)
HCT: 35.1 % — ABNORMAL LOW (ref 39.0–52.0)
Hemoglobin: 11.9 g/dL — ABNORMAL LOW (ref 13.0–17.0)
Lymphocytes Relative: 11.4 % — ABNORMAL LOW (ref 12.0–46.0)
Lymphs Abs: 0.9 10*3/uL (ref 0.7–4.0)
MCHC: 33.8 g/dL (ref 30.0–36.0)
MCV: 86.3 fl (ref 78.0–100.0)
Monocytes Absolute: 0.4 10*3/uL (ref 0.1–1.0)
Monocytes Relative: 5.4 % (ref 3.0–12.0)
Neutro Abs: 6.5 10*3/uL (ref 1.4–7.7)
Neutrophils Relative %: 81.8 % — ABNORMAL HIGH (ref 43.0–77.0)
Platelets: 144 10*3/uL — ABNORMAL LOW (ref 150.0–400.0)
RBC: 4.07 Mil/uL — ABNORMAL LOW (ref 4.22–5.81)
RDW: 17.9 % — ABNORMAL HIGH (ref 11.5–14.6)
WBC: 7.9 10*3/uL (ref 4.5–10.5)

## 2010-06-01 LAB — BASIC METABOLIC PANEL
BUN: 28 mg/dL — ABNORMAL HIGH (ref 6–23)
CO2: 29 mEq/L (ref 19–32)
Calcium: 9.1 mg/dL (ref 8.4–10.5)
Chloride: 102 mEq/L (ref 96–112)
Creatinine, Ser: 1.5 mg/dL (ref 0.4–1.5)
GFR: 49.25 mL/min — ABNORMAL LOW (ref >60.00)
Glucose, Bld: 111 mg/dL — ABNORMAL HIGH (ref 70–99)
Potassium: 3.9 mEq/L (ref 3.5–5.1)
Sodium: 138 mEq/L (ref 135–145)

## 2010-06-01 LAB — TSH: TSH: 0.16 u[IU]/mL — ABNORMAL LOW (ref 0.35–5.50)

## 2010-06-01 LAB — FERRITIN: Ferritin: 21.8 ng/mL — ABNORMAL LOW (ref 22.0–322.0)

## 2010-06-06 ENCOUNTER — Telehealth: Payer: Self-pay | Admitting: Family Medicine

## 2010-06-07 ENCOUNTER — Encounter: Payer: Self-pay | Admitting: Family Medicine

## 2010-06-08 ENCOUNTER — Encounter (INDEPENDENT_AMBULATORY_CARE_PROVIDER_SITE_OTHER): Payer: Self-pay | Admitting: *Deleted

## 2010-06-08 ENCOUNTER — Telehealth (INDEPENDENT_AMBULATORY_CARE_PROVIDER_SITE_OTHER): Payer: Self-pay | Admitting: *Deleted

## 2010-06-08 ENCOUNTER — Telehealth: Payer: Self-pay | Admitting: Family Medicine

## 2010-06-08 ENCOUNTER — Ambulatory Visit
Admission: RE | Admit: 2010-06-08 | Discharge: 2010-06-08 | Payer: Self-pay | Source: Home / Self Care | Attending: Family Medicine | Admitting: Family Medicine

## 2010-06-08 DIAGNOSIS — D509 Iron deficiency anemia, unspecified: Secondary | ICD-10-CM | POA: Insufficient documentation

## 2010-06-08 DIAGNOSIS — C61 Malignant neoplasm of prostate: Secondary | ICD-10-CM | POA: Insufficient documentation

## 2010-06-08 HISTORY — DX: Iron deficiency anemia, unspecified: D50.9

## 2010-06-10 LAB — CONVERTED CEMR LAB
Bilirubin, Direct: 0.1 mg/dL (ref 0.0–0.3)
CO2: 30 meq/L (ref 19–32)
Calcium: 9 mg/dL (ref 8.4–10.5)
Calcium: 9.6 mg/dL (ref 8.4–10.5)
Chloride: 104 meq/L (ref 96–112)
Creatinine, Ser: 1.47 mg/dL (ref 0.40–1.50)
GFR calc non Af Amer: 44.46 mL/min (ref >60)
GFR calc non Af Amer: 53.06 mL/min (ref >60)
Glucose, Bld: 116 mg/dL — ABNORMAL HIGH (ref 70–99)
Potassium: 4.4 meq/L (ref 3.5–5.1)
Potassium: 4.5 meq/L (ref 3.5–5.1)
Potassium: 4.6 meq/L (ref 3.5–5.3)
Sodium: 139 meq/L (ref 135–145)
Sodium: 140 meq/L (ref 135–145)
Sodium: 141 meq/L (ref 135–145)
TSH: 6.87 microintl units/mL — ABNORMAL HIGH (ref 0.35–5.50)
Total Bilirubin: 0.4 mg/dL (ref 0.3–1.2)

## 2010-06-13 ENCOUNTER — Encounter: Payer: Self-pay | Admitting: Family Medicine

## 2010-06-14 DIAGNOSIS — K5730 Diverticulosis of large intestine without perforation or abscess without bleeding: Secondary | ICD-10-CM

## 2010-06-14 NOTE — Cardiovascular Report (Signed)
Summary: Summary Report  Summary Report   Imported By: Kassie Mends 07/04/2009 10:20:00  _____________________________________________________________________  External Attachment:    Type:   Image     Comment:   External Document

## 2010-06-14 NOTE — Assessment & Plan Note (Signed)
Summary: having trouble walking-feels like having rxn to meds/dt   Vital Signs:  Patient profile:   75 year old male Height:      70.5 inches Weight:      173 pounds BMI:     24.56 Pulse rate:   72 / minute BP sitting:   115 / 76  (right arm) Cuff size:   regular  Vitals Entered By: Francee Piccolo CMA Duncan Dull) (May 11, 2010 3:29 PM) CC: muscle ache in legs, also weakness, pt thinks it may be new synthroid rx or grifulvin rx Is Patient Diabetic? Yes Did you bring your meter with you today? No   History of Present Illness: 75 y/o WM here for leg pains that started coincident with starting griseofulvin a couple of days ago. He also started levothyroxine a couple of days ago after previously taking samples from his prior MD office.  No fevers, no n/v/d, no SOB, no dizziness or rash. Hurts from hamstrings down to lower legs.  No numbness or tingling or weakness.  Medications Prior to Update: 1)  Symbicort 160-4.5 Mcg/act  Aero (Budesonide-Formoterol Fumarate) .... 2 Puffs Two Times A Day 2)  Spiriva Handihaler 18 Mcg  Caps (Tiotropium Bromide Monohydrate) .... Use One Inhalation Daily. 3)  Nasonex 50 Mcg/act  Susp (Mometasone Furoate) .... Two Puffs Each Nostril Daily 4)  Zyrtec Allergy 10 Mg Tabs (Cetirizine Hcl) .... Take 1 Tablet By Mouth Once A Day 5)  Adult Aspirin Ec Low Strength 81 Mg  Tbec (Aspirin) .... One Tablet Daily. 6)  Coreg 12.5 Mg  Tabs (Carvedilol) .... Take 1/2 Tablet Two Times A Day 7)  Nitro-Dur 0.4 Mg/hr  Pt24 (Nitroglycerin) .... Place Under Tongue As Needed For Chest Pain. 8)  Lasix 40 Mg  Tabs (Furosemide) .... Take 1tablet By Mouth Two Times A Day 9)  Januvia 100 Mg  Tabs (Sitagliptin Phosphate) .... Take One Tablet Daily. 10)  Lithium Carbonate 300 Mg  Caps (Lithium Carbonate) .... Take One Capsule Two Times A Day 11)  Finasteride 5 Mg Tabs (Finasteride) .Marland Kitchen.. 1 By Mouth Daily 12)  Warfarin Sodium 5 Mg Tabs (Warfarin Sodium) .... Take As  Directed Per [facility] 13)  Amiodarone Hcl 200 Mg Tabs (Amiodarone Hcl) .... Take 1 Tablet By Mouth Once A Day 14)  Potassium Chloride Cr 10 Meq Cr-Caps (Potassium Chloride) .... Take One Tablet By Mouth Daily 15)  Guaifenesin Dac 30-10-100 Mg/54ml Soln (Pseudoephedrine-Codeine-Gg) .... As Needed 16)  Acetaminophen 325 Mg  Tabs (Acetaminophen) .... As Needed 17)  Robitussin Dm 100-10 Mg/81ml Syrp (Dextromethorphan-Guaifenesin) .... As Needed For Cough 18)  Synthroid 125 Mcg Tabs (Levothyroxine Sodium) .... Take 1 Tablet By Mouth Once A Day 19)  Flomax 0.4 Mg Caps (Tamsulosin Hcl) .... Take 1 Tablet By Mouth Once A Day 20)  Xopenex Hfa 45 Mcg/act Aero (Levalbuterol Tartrate) .... Inhale 2 Puffs Four Times A Day As Needed 21)  Lotrisone 1-0.05 % Crea (Clotrimazole-Betamethasone) .... 2 X A Day 22)  Topicort 0.25 % Crea (Desoximetasone) .... As Needed 23)  Benicar 20 Mg Tabs (Olmesartan Medoxomil) .... Take One-Half Tablet By Mouth Daily 24)  Isosorbide Mononitrate Cr 30 Mg Xr24h-Tab (Isosorbide Mononitrate) .... Take One Tablet By Mouth Once Daily 25)  Cyanocobalamin 1000 Mcg/ml Soln (Cyanocobalamin) .... Once Monthly 26)  Xopenex Hfa 45 Mcg/act Aero (Levalbuterol Tartrate) .... Uad 27)  Grifulvin V 500 Mg Tabs (Griseofulvin Microsize) .Marland Kitchen.. 1 Tab By Mouth Qd  Current Medications (verified): 1)  Symbicort 160-4.5 Mcg/act  Aero (Budesonide-Formoterol Fumarate) .... 2 Puffs Two Times A Day 2)  Spiriva Handihaler 18 Mcg  Caps (Tiotropium Bromide Monohydrate) .... Use One Inhalation Daily. 3)  Nasonex 50 Mcg/act  Susp (Mometasone Furoate) .... Two Puffs Each Nostril Daily 4)  Zyrtec Allergy 10 Mg Tabs (Cetirizine Hcl) .... Take 1 Tablet By Mouth Once A Day 5)  Adult Aspirin Ec Low Strength 81 Mg  Tbec (Aspirin) .... One Tablet Daily. 6)  Coreg 12.5 Mg  Tabs (Carvedilol) .... Take 1/2 Tablet Two Times A Day 7)  Nitro-Dur 0.4 Mg/hr  Pt24 (Nitroglycerin) .... Place Under Tongue As Needed For Chest  Pain. 8)  Lasix 40 Mg  Tabs (Furosemide) .... Take 1tablet By Mouth Two Times A Day 9)  Januvia 100 Mg  Tabs (Sitagliptin Phosphate) .... Take One Tablet Daily. 10)  Lithium Carbonate 300 Mg  Caps (Lithium Carbonate) .... Take One Capsule Two Times A Day 11)  Finasteride 5 Mg Tabs (Finasteride) .Marland Kitchen.. 1 By Mouth Daily 12)  Warfarin Sodium 5 Mg Tabs (Warfarin Sodium) .... Take As Directed Per [facility] 13)  Amiodarone Hcl 200 Mg Tabs (Amiodarone Hcl) .... Take 1 Tablet By Mouth Once A Day 14)  Potassium Chloride Cr 10 Meq Cr-Caps (Potassium Chloride) .... Take One Tablet By Mouth Daily 15)  Guaifenesin Dac 30-10-100 Mg/68ml Soln (Pseudoephedrine-Codeine-Gg) .... As Needed 16)  Acetaminophen 325 Mg  Tabs (Acetaminophen) .... As Needed 17)  Robitussin Dm 100-10 Mg/46ml Syrp (Dextromethorphan-Guaifenesin) .... As Needed For Cough 18)  Synthroid 125 Mcg Tabs (Levothyroxine Sodium) .... Take 1 Tablet By Mouth Once A Day 19)  Flomax 0.4 Mg Caps (Tamsulosin Hcl) .... Take 1 Tablet By Mouth Once A Day 20)  Xopenex Hfa 45 Mcg/act Aero (Levalbuterol Tartrate) .... Inhale 2 Puffs Four Times A Day As Needed 21)  Lotrisone 1-0.05 % Crea (Clotrimazole-Betamethasone) .... 2 X A Day 22)  Topicort 0.25 % Crea (Desoximetasone) .... As Needed 23)  Benicar 20 Mg Tabs (Olmesartan Medoxomil) .... Take One-Half Tablet By Mouth Daily 24)  Isosorbide Mononitrate Cr 30 Mg Xr24h-Tab (Isosorbide Mononitrate) .... Take One Tablet By Mouth Once Daily 25)  Cyanocobalamin 1000 Mcg/ml Soln (Cyanocobalamin) .... Once Monthly 26)  Xopenex Hfa 45 Mcg/act Aero (Levalbuterol Tartrate) .... Uad 27)  Grifulvin V 500 Mg Tabs (Griseofulvin Microsize) .Marland Kitchen.. 1 Tab By Mouth Qd  Allergies (verified): 1)  ! Morphine 2)  ! Lipitor 3)  ! * Ambien 4)  ! Prednisone  Past History:  Past medical, surgical, family and social histories (including risk factors) reviewed, and no changes noted (except as noted below).  Past Medical  History: Reviewed history from 10/02/2009 and no changes required. CAD (ICD-414.00)- post coronary artery bypass graft surgery with prior percutaneos intervention of the saphenous vein graft with known saphenous vein graft disease. Abdominal aortic aneurysm      - 5.8 cm abd u/s Nov 2010 Descending thoracic aorta aneurysm Bilateral popliteal artery aneurysm CAD s/p MI Ischemic cardiomyopathy with EF 40% Paroxysmal atrial fibrillation HTN Peripheral vascular disease Dyslipidemia Diabetes mellitus GERD Hiatal hernia Diverticular disease BPH COPD      - 05/09/08 PFT FEV1 1.84 (55%), FVC 4.42 (95%), TLC 6.46 (92%), DLCO 72%, no BD response Pneumonia January, 2009 Allergic rhinitis Shingles OSA      - PSG 06/14/09 RDI 17, PLMI 96      - Intolerant of CPAP or BPAP  Past Surgical History: Reviewed history from 02/15/2009 and no changes required. Insertion of a new rate sensing pacing lead with  removal of a previous implanted ICD and insertion of device back in the pocket with defibrillation threshold testing.   Partial colectomy 1995 CABG 1996 Aorto-bifemoral bypass 1999 by [name] Early implantable cardio defibrillator August 28, 2004 by [name] stents placed to the saphenous vein graft and also atrial fibrillation. radiofrequency ablation in 2006. Nasal surgery  Family History: Reviewed history from 09/02/2008 and no changes required.  Mother died in her 28s of CVA.  Father died at 24 of   lung cancer, MI in his 54s.  He has 5 siblings, 2 of which has CAD.   Social History: Reviewed history from 12/27/2008 and no changes required.  Lives in [place] with his wife.  He is a retired   Curator.  He has an 80-pack-year history of tobacco abuse, quitting 2005  He rarely has a beer.  He denies drug use.  He plays golf   twice a week, but rides the cart to the ball, hits it, and then gets   back in the cart.      Physical Exam  General:  VS: noted, all  normal. Gen: Alert, well appearing, oriented x 4. LEGS: no rash, no tenderness to palpation.  Strength 5/5 prox and dist bilat.  No edema.   Impression & Recommendations:  Problem # 1:  ADVERSE DRUG REACTION (ICD-995.20) Assessment New Stop griseofulvin.  I doubt the minimal change in his synthroid dose is responsible for this, so we'll continue this dose. Return in 4 days for recheck and at that time we'll go ahead and take the fingernail off that we suspect is fungal.  Complete Medication List: 1)  Symbicort 160-4.5 Mcg/act Aero (Budesonide-formoterol fumarate) .... 2 puffs two times a day 2)  Spiriva Handihaler 18 Mcg Caps (Tiotropium bromide monohydrate) .... Use one inhalation daily. 3)  Nasonex 50 Mcg/act Susp (Mometasone furoate) .... Two puffs each nostril daily 4)  Zyrtec Allergy 10 Mg Tabs (Cetirizine hcl) .... Take 1 tablet by mouth once a day 5)  Adult Aspirin Ec Low Strength 81 Mg Tbec (Aspirin) .... One tablet daily. 6)  Coreg 12.5 Mg Tabs (Carvedilol) .... Take 1/2 tablet two times a day 7)  Nitro-dur 0.4 Mg/hr Pt24 (Nitroglycerin) .... Place under tongue as needed for chest pain. 8)  Lasix 40 Mg Tabs (Furosemide) .... Take 1tablet by mouth two times a day 9)  Januvia 100 Mg Tabs (Sitagliptin phosphate) .... Take one tablet daily. 10)  Lithium Carbonate 300 Mg Caps (Lithium carbonate) .... Take one capsule two times a day 11)  Finasteride 5 Mg Tabs (Finasteride) .Marland Kitchen.. 1 by mouth daily 12)  Warfarin Sodium 5 Mg Tabs (Warfarin sodium) .... Take as directed per coumadin clinic 13)  Amiodarone Hcl 200 Mg Tabs (Amiodarone hcl) .... Take 1 tablet by mouth once a day 14)  Potassium Chloride Cr 10 Meq Cr-caps (Potassium chloride) .... Take one tablet by mouth daily 15)  Guaifenesin Dac 30-10-100 Mg/74ml Soln (Pseudoephedrine-codeine-gg) .... As needed 16)  Acetaminophen 325 Mg Tabs (Acetaminophen) .... As needed 17)  Robitussin Dm 100-10 Mg/85ml Syrp (Dextromethorphan-guaifenesin)  .... As needed for cough 18)  Synthroid 125 Mcg Tabs (Levothyroxine sodium) .... Take 1 tablet by mouth once a day 19)  Flomax 0.4 Mg Caps (Tamsulosin hcl) .... Take 1 tablet by mouth once a day 20)  Xopenex Hfa 45 Mcg/act Aero (Levalbuterol tartrate) .... Inhale 2 puffs four times a day as needed 21)  Lotrisone 1-0.05 % Crea (Clotrimazole-betamethasone) .... 2 x a day 22)  Topicort 0.25 % Crea (Desoximetasone) .... As needed 23)  Benicar 20 Mg Tabs (Olmesartan medoxomil) .... Take one-half tablet by mouth daily 24)  Isosorbide Mononitrate Cr 30 Mg Xr24h-tab (Isosorbide mononitrate) .... Take one tablet by mouth once daily 25)  Cyanocobalamin 1000 Mcg/ml Soln (Cyanocobalamin) .... Once monthly 26)  Xopenex Hfa 45 Mcg/act Aero (Levalbuterol tartrate) .... Uad 27)  Grifulvin V 500 Mg Tabs (Griseofulvin microsize) .Marland Kitchen.. 1 tab by mouth qd  Patient Instructions: 1)  Make f/u appt for this coming  tuesday at 1pm for fingernail removal.   Orders Added: 1)  Est. Patient Level II [[address]]

## 2010-06-14 NOTE — Letter (Signed)
Summary: [facility]  [address]   [place], [place] [address]   Phone: [phone]  Fax: [phone]     Sep 29, 2009 MRN: [id number]   DAVIT VASSAR [address] [place], [place]  [address]   Dear Mr. Cipriani,  This letter is being sent to you as a reminder that it is necessary for you to get your INR/PT checked regularly so that we can optimize your care.  Our records indicate that you were scheduled to have a test done recently.  As of today, we have not received the results of this test.  It is very important that you have your INR checked.  Please call our office at the number listed above to schedule an appointment at your earliest convenience.    If you have recently had your protime checked or have discontinued this medication, please contact our office at the above phone number to clarify this issue.  Thank you for this prompt attention to this important health care matter.  Sincerely,   [facility] Reduction [facility] Team

## 2010-06-14 NOTE — Progress Notes (Signed)
Summary: c pap machine  Phone Note Call from Patient   Caller: Patient Call For: sood Summary of Call: need to talk to nurse about c pap machine  Initial call taken by: Rickard Patience,  July 31, 2009 1:28 PM  Follow-up for Phone Call        called and spoke with pt. pt states last week when VS was out of office, CY sent order to DME company for a change in his pressure.  Pt states no one from [facility] [employer] has come to his home to adjust this or show him how to use the ramp button and pt is upset with this.  Will forward message to [facility] to address.  Aundra Millet Reynolds LPN  July 31, 2009 1:38 PM   Additional Follow-up for Phone Call Additional follow up Details #1::        spoke to mille [facility] and she will call this pt to talk to him about changing his pressures for comfort to get better conpliance on cpap and get a pressure setting and will shw pt how to use ramp button Additional Follow-up by: Oneita Jolly,  July 31, 2009 2:43 PM

## 2010-06-14 NOTE — Assessment & Plan Note (Signed)
Summary: cough, congestion - VS pt / cj   Visit Type:  Acute visit Copy to:  [name], [name], Dr. Arbie Cookey Primary Provider/Referring Provider:  Corrin Parker  CC:  VS pt-c/o intermittent productive cough with thick clear mucus, increased SOB all the time, wheezing, and chest tightness x 2 to 3 days.  History of Present Illness: 75 yo with known history of COPD, hoarseness, pulmonary nodule and OSA intolerant of CPAP.   April 11, 2010 4:14 PM  c/o intermittent productive cough with thick clear mucus, increased SOB all the time, wheezing, and chest tightness x 2 to 3 days.- started with a URI Reviewed last imaging & meds, h/o feeling strung up with prednisone - not true allergy  Preventive Screening-Counseling & Management  Alcohol-Tobacco     Smoking Status: quit     Year Quit: 2006     Pack years: l ppd x 40 years  Current Medications (verified): 1)  Symbicort 160-4.5 Mcg/act  Aero (Budesonide-Formoterol Fumarate) .... 2 Puffs Two Times A Day 2)  Spiriva Handihaler 18 Mcg  Caps (Tiotropium Bromide Monohydrate) .... Use One Inhalation Daily. 3)  Nasonex 50 Mcg/act  Susp (Mometasone Furoate) .... Two Puffs Each Nostril Daily 4)  Zyrtec Allergy 10 Mg Tabs (Cetirizine Hcl) .... Take 1 Tablet By Mouth Once A Day 5)  Adult Aspirin Ec Low Strength 81 Mg  Tbec (Aspirin) .... One Tablet Daily. 6)  Coreg 12.5 Mg  Tabs (Carvedilol) .... Take 1/2 Tablet Two Times A Day 7)  Nitro-Dur 0.4 Mg/hr  Pt24 (Nitroglycerin) .... Place Under Tongue As Needed For Chest Pain. 8)  Lasix 40 Mg  Tabs (Furosemide) .... Take 2 Tablet By Mouth Once Daily 9)  Januvia 100 Mg  Tabs (Sitagliptin Phosphate) .... Take One Tablet Daily. 10)  Lithium Carbonate 300 Mg  Caps (Lithium Carbonate) .... Take One Capsule Two Times A Day 11)  Finasteride 5 Mg Tabs (Finasteride) .Marland Kitchen.. 1 By Mouth Daily 12)  Warfarin Sodium 5 Mg Tabs (Warfarin Sodium) .... Take As Directed Per [facility]mad[facility] Clinic 13)  Amiodarone Hcl 200  Mg Tabs (Amiodarone Hcl) .... Take 1 Tablet By Mouth Once A Day 14)  Potassium Chloride Cr 10 Meq Cr-Caps (Potassium Chloride) .... Take One Tablet By Mouth Daily 15)  Guaifenesin Dac 30-10-100 Mg/13ml Soln (Pseudoephedrine-Codeine-Gg) .... As Needed 16)  Acetaminophen 325 Mg  Tabs (Acetaminophen) .... As Needed 17)  Robitussin Dm 100-10 Mg/33ml Syrp (Dextromethorphan-Guaifenesin) .... As Needed For Cough 18)  Synthroid 125 Mcg Tabs (Levothyroxine Sodium) .... Take 1 Tablet By Mouth Once A Day 19)  Flomax 0.4 Mg Caps (Tamsulosin Hcl) .... Take 1 Tablet By Mouth Once A Day 20)  Xopenex Hfa 45 Mcg/act Aero (Levalbuterol Tartrate) .... Inhale 2 Puffs Four Times A Day As Needed 21)  Lotrisone 1-0.05 % Crea (Clotrimazole-Betamethasone) .... 2 X A Day 22)  Topicort 0.25 % Crea (Desoximetasone) .... As Needed 23)  Benicar 20 Mg Tabs (Olmesartan Medoxomil) .... Take One-Half Tablet By Mouth Daily 24)  Isosorbide Mononitrate Cr 30 Mg Xr24h-Tab (Isosorbide Mononitrate) .... Take One Tablet By Mouth Once Daily 25)  Cyanocobalamin 1000 Mcg/ml Soln (Cyanocobalamin) .... Once Monthly  Allergies (verified): 1)  ! Morphine 2)  ! Lipitor 3)  ! * Ambien 4)  ! Prednisone  Past History:  Past Medical History: Last updated: 10/02/2009 CAD (ICD-414.00)- post coronary artery bypass graft surgery with prior percutaneos intervention of the saphenous vein graft with known saphenous vein graft disease. Abdominal aortic aneurysm      -  5.8 cm abd u/s Nov 2010 Descending thoracic aorta aneurysm Bilateral popliteal artery aneurysm CAD s/p MI Ischemic cardiomyopathy with EF 40% Paroxysmal atrial fibrillation HTN Peripheral vascular disease Dyslipidemia Diabetes mellitus GERD Hiatal hernia Diverticular disease BPH COPD      - 05/09/08 PFT FEV1 1.84 (55%), FVC 4.42 (95%), TLC 6.46 (92%), DLCO 72%, no BD response Pneumonia January, 2009 Allergic rhinitis Shingles OSA      - PSG 06/14/09 RDI 17, PLMI 96       - Intolerant of CPAP or BPAP  Social History: Last updated: 12/27/2008  Lives in [place] with his wife.  He is a retired   Curator.  He has an 80-pack-year history of tobacco abuse, quitting 2005  He rarely has a beer.  He denies drug use.  He plays golf   twice a week, but rides the cart to the ball, hits it, and then gets   back in the cart.      Review of Systems       The patient complains of dyspnea on exertion and prolonged cough.  The patient denies anorexia, fever, weight loss, weight gain, vision loss, decreased hearing, hoarseness, chest pain, syncope, peripheral edema, headaches, hemoptysis, abdominal pain, melena, hematochezia, severe indigestion/heartburn, hematuria, muscle weakness, suspicious skin lesions, transient blindness, difficulty walking, depression, unusual weight change, abnormal bleeding, enlarged lymph nodes, and angioedema.    Vital Signs:  Patient profile:   75 year old male Height:      72 inches Weight:      173 pounds BMI:     23.55 O2 Sat:      98 % on Room air Temp:     97.0 degrees F oral Pulse rate:   55 / minute BP sitting:   126 / 76  (right arm) Cuff size:   regular  Vitals Entered By: Zackery Barefoot CMA (April 11, 2010 3:56 PM)  O2 Flow:  Room air CC: VS pt-c/o intermittent productive cough with thick clear mucus, increased SOB all the time, wheezing, chest tightness x 2 to 3 days Comments Medications reviewed with patient Verified contact number and pharmacy with patient Zackery Barefoot CMA  April 11, 2010 3:57 PM    Physical Exam  Additional Exam:  GEN: A/Ox3; pleasant , NAD HEENT:  Seat Ple[place]/AT, , EACs-clear, TMs-wnl, NOSE-clear, THROAT-clear NECK:  Supple w/ fair ROM; no JVD; normal carotid impulses w/o bruits; no thyromegaly or nodules palpated; no lymphadenopathy. RESP  Coarse BS w/few crackles RLL CARD:  RRR, no m/r/g    Musco: Warm bil,  no calf tenderness edema, clubbing, pulses intact    Impression &  Recommendations:  Problem # 1:  COPD (ICD-496) -acute bronchitis No infiltrate on CXR to account for the crackles heard on exam Will treat with azithro x 5 ds he will call if no better No bspasm presently , so does not need steroids - will call if increased wheezing  Medications Added to Medication List This Visit: 1)  Synthroid 125 Mcg Tabs (Levothyroxine sodium) .... Take 1 tablet by mouth once a day 2)  Cyanocobalamin 1000 Mcg/ml Soln (Cyanocobalamin) .... Once monthly 3)  Azithromycin 500 Mg Tabs (Azithromycin) .... Once daily  Other Orders: T-2 View CXR (71020TC) Est. Patient Level III ([address]) Prescription Created Electronically [id number])  Patient Instructions: 1)  Copy sent to: 2)  A chest x-ray has been recommended.  Your imaging study may require preauthorization.  3)  Will send in antibiotic after chest x ray 4)  Call if no  better by Friday or if wheezing worse - may need prednisone Prescriptions: AZITHROMYCIN 500 MG TABS (AZITHROMYCIN) once daily  #5 x 0   Entered and Authorized by:   Comer Locket Vassie Loll MD   Signed by:   Comer Locket Vassie Loll MD on 04/11/2010   Method used:   Electronically to        [id number] ([facility])       [address]       [place], [place]  [address]       Ph: [phone] or [phone]       Fax: [phone]   RxID:   [id number]

## 2010-06-14 NOTE — Letter (Signed)
Summary: New Patient letter  [facility]  [address] [place], [place] [address]   Phone: [phone]  Fax: [phone]       06/08/2010 MRN: [id number]  Roberto [name] [address] [place], [place]  [address]  Dear Mr. Desilva,  Welcome to the Gastroenterology Division at [facility].    You are scheduled to see Dr.  Sheryn Bison on July 10, 2010 at  2:45pm on the 3rd floor at [facility], [address]  We ask that you try to arrive at our office 15 minutes prior to your appointment time to allow for check-in.  We would like you to complete the enclosed self-administered evaluation form prior to your visit and bring it with you on the day of your appointment.  We will review it with you.  Also, please bring a complete list of all your medications or, if you prefer, bring the medication bottles and we will list them.  Please bring your insurance card so that we may make a copy of it.  If your insurance requires a referral to see a specialist, please bring your referral form from your primary care physician.  Co-payments are due at the time of your visit and may be paid by cash, check or credit card.     Your office visit will consist of a consult with your physician (includes a physical exam), any laboratory testing he/she may order, scheduling of any necessary diagnostic testing (e.g. x-ray, ultrasound, CT-scan), and scheduling of a procedure (e.g. Endoscopy, Colonoscopy) if required.  Please allow enough time on your schedule to allow for any/all of these possibilities.    If you cannot keep your appointment, please call [phone] to cancel or reschedule prior to your appointment date.  This allows [place] the opportunity to schedule an appointment for another patient in need of care.  If you do not cancel or reschedule by 5 p.m. the business day prior to your appointment date, you will be charged a $50.00 late cancellation/no-show fee.    Thank  you for choosing [facility] for your medical needs.  We appreciate the opportunity to care for you.  Please visit [place] at our website  to learn more about our practice.                     Sincerely,                                                             The Gastroenterology Division

## 2010-06-14 NOTE — Cardiovascular Report (Signed)
Summary: Health Management Program Summary Report   Health Management Program Summary Report   Imported By: Roderic Ovens 11/29/2009 10:19:12  _____________________________________________________________________  External Attachment:    Type:   Image     Comment:   External Document

## 2010-06-14 NOTE — Assessment & Plan Note (Signed)
Summary: cough congestion/apc   Copy to:  [name], [name] Primary Provider/Referring Provider:  Corrin Parker  CC:  Acute visit for cough and congestion.. The patient c/o cough with clear to yellow mucus and increased sob with exertion and at rest for 1 month..  History of Present Illness: 75 yo with known history of COPD and hoarseness.  He continues to have a cough and gurgle in his throat at night.  He can sometimes bring up clear mucus.  He has a runny nose, but no worse than usual.  He is not having fever, or chest pain.  He gets some wheeze, but this is more in his throat.  He has these things happen sometimes in the day, but more so at night.  He has to sleep sitting up in his recliner.  He had his sleep test from Feb. 2, 2011.  This showed moderate sleep apnea with an RDI of 17.  He also had an increase in his leg movements with a PLMI of 96.  Current Medications (verified): 1)  Symbicort 160-4.5 Mcg/act  Aero (Budesonide-Formoterol Fumarate) .... 2 Puffs Two Times A Day 2)  Spiriva Handihaler 18 Mcg  Caps (Tiotropium Bromide Monohydrate) .... Use One Inhalation Daily. 3)  Xopenex Hfa 45 Mcg/act Aero (Levalbuterol Tartrate) .... Two Puffs Qid As Needed 4)  Nasonex 50 Mcg/act  Susp (Mometasone Furoate) .... Two Puffs Each Nostril Daily 5)  Zyrtec Allergy 10 Mg Tabs (Cetirizine Hcl) .... Take 1 Tablet By Mouth Once A Day 6)  Adult Aspirin Ec Low Strength 81 Mg  Tbec (Aspirin) .... One Tablet Daily. 7)  Coreg 12.5 Mg  Tabs (Carvedilol) .... Take 1/2 Tablet Two Times A Day 8)  Nitro-Dur 0.4 Mg/hr  Pt24 (Nitroglycerin) .... Place Under Tongue As Needed For Chest Pain. 9)  Lasix 40 Mg  Tabs (Furosemide) .... Take One Tabet Daily. 10)  Actos 45 Mg  Tabs (Pioglitazone Hcl) .... Take One Tablet Daily. 11)  Januvia 100 Mg  Tabs (Sitagliptin Phosphate) .... Take One Tablet Daily. 12)  Nexium 40 Mg  Cpdr (Esomeprazole Magnesium) .... Take One Capsule By Mouth Daily 13)  Lithium  Carbonate 300 Mg  Caps (Lithium Carbonate) .... Take One Capsule Two Times A Day 14)  Finasteride 5 Mg Tabs (Finasteride) .Marland Kitchen.. 1 By Mouth Daily 15)  Warfarin Sodium 5 Mg Tabs (Warfarin Sodium) .... Take As Directed Per Coumadin Clinic 16)  Mucinex .... As Needed 17)  Amiodarone Hcl 200 Mg Tabs (Amiodarone Hcl) .... Take 1 Tablet By Mouth Once A Day 18)  Potassium Chloride Cr 10 Meq Cr-Caps (Potassium Chloride) .... Take One Tablet By Mouth Daily 19)  Guaifenesin Dac 30-10-100 Mg/24ml Soln (Pseudoephedrine-Codeine-Gg) .... As Needed 20)  Acetaminophen 325 Mg  Tabs (Acetaminophen) .... As Needed 21)  Robitussin Dm 100-10 Mg/87ml Syrp (Dextromethorphan-Guaifenesin) .... As Needed For Cough  Allergies (verified): 1)  ! Morphine 2)  ! Lipitor 3)  ! * Ambien 4)  ! Prednisone  Past History:  Past Medical History: CAD (ICD-414.00)- post coronary artery bypass graft surgery with prior percutaneos intervention of the saphenous vein graft with known saphenous vein graft disease. Abdominal aortic aneurysm      - 5.8 cm abd u/s Nov 2010 Bilateral popliteal artery aneurysm CAD s/p MI Ischemic cardiomyopathy with EF 40% Paroxysmal atrial fibrillation HTN Peripheral vascular disease Dyslipidemia Diabetes mellitus GERD Hiatal hernia Diverticular disease BPH COPD      - 05/09/08 PFT FEV1 1.84 (55%), FVC 4.42 (95%), TLC 6.46 (92%),  DLCO 72%, no BD response Pneumonia January, 2009 Allergic rhinitis Shingles OSA      - PSG 06/14/09 RDI 17, PLMI 96  Past Surgical History: Reviewed history from 02/15/2009 and no changes required. Insertion of a new rate sensing pacing lead with  removal of a previous implanted ICD and insertion of device back in the pocket with defibrillation threshold testing.   Partial colectomy 1995 CABG 1996 Aorto-bifemoral bypass 1999 by [name] Early implantable cardio defibrillator August 28, 2004 by [name] stents placed to the saphenous vein graft and also  atrial fibrillation. radiofrequency ablation in 2006. Nasal surgery  Vital Signs:  Patient profile:   75 year old male Height:      71 inches (180.34 cm) Weight:      183 pounds (83.18 kg) BMI:     25.62 O2 Sat:      91 % on Room air Temp:     97.8 degrees F (36.56 degrees C) oral Pulse rate:   67 / minute BP sitting:   110 / 70  (right arm) Cuff size:   regular  Vitals Entered By: Michel Bickers CMA (July 04, 2009 3:55 PM)  O2 Sat at Rest %:  91 O2 Flow:  Room air  Physical Exam  General:  normal appearance.   Nose:  clear nasal discharge.   Mouth:  no deformity or lesions Neck:  no JVD.   Lungs:  decreased breath sounds, no wheezing or rales Heart:  regular rate and rhythm, S1, S2 without murmurs, rubs, gallops, or clicks Extremities:  No edema Cervical Nodes:  no significant adenopathy   Impression & Recommendations:  Problem # 1:  OBSTRUCTIVE SLEEP APNEA (ICD-327.23) I reviewed his sleep test.  I think a good portion of his current symptoms of upper airway irritation at night may be related to his sleep apnea.  I will start him on CPAP, and monitor his clinical response.  I also explained how sleep apnea can affect his health, particularly in reference to his cardiovascular disease.  Driving precautions, and need for weight control were discussed.  Problem # 2:  PULMONARY NODULE (ICD-518.89)  He will need a follow up non-contrast CT chest in June 2011.  Problem # 3:  COPD (ICD-496) He is to continue his current inhaler regimen.  Problem # 4:  ALLERGIC RHINITIS (ICD-477.9) He is to continue his current sinus regimen.  Medications Added to Medication List This Visit: 1)  Finasteride 5 Mg Tabs (Finasteride) .Marland Kitchen.. 1 by mouth daily 2)  Robitussin Dm 100-10 Mg/69ml Syrp (Dextromethorphan-guaifenesin) .... As needed for cough  Complete Medication List: 1)  Symbicort 160-4.5 Mcg/act Aero (Budesonide-formoterol fumarate) .... 2 puffs two times a day 2)  Spiriva  Handihaler 18 Mcg Caps (Tiotropium bromide monohydrate) .... Use one inhalation daily. 3)  Xopenex Hfa 45 Mcg/act Aero (Levalbuterol tartrate) .... Two puffs qid as needed 4)  Nasonex 50 Mcg/act Susp (Mometasone furoate) .... Two puffs each nostril daily 5)  Zyrtec Allergy 10 Mg Tabs (Cetirizine hcl) .... Take 1 tablet by mouth once a day 6)  Adult Aspirin Ec Low Strength 81 Mg Tbec (Aspirin) .... One tablet daily. 7)  Coreg 12.5 Mg Tabs (Carvedilol) .... Take 1/2 tablet two times a day 8)  Nitro-dur 0.4 Mg/hr Pt24 (Nitroglycerin) .... Place under tongue as needed for chest pain. 9)  Lasix 40 Mg Tabs (Furosemide) .... Take one tabet daily. 10)  Actos 45 Mg Tabs (Pioglitazone hcl) .... Take one tablet daily. 11)  Januvia  100 Mg Tabs (Sitagliptin phosphate) .... Take one tablet daily. 12)  Nexium 40 Mg Cpdr (Esomeprazole magnesium) .... Take one capsule by mouth daily 13)  Lithium Carbonate 300 Mg Caps (Lithium carbonate) .... Take one capsule two times a day 14)  Finasteride 5 Mg Tabs (Finasteride) .Marland Kitchen.. 1 by mouth daily 15)  Warfarin Sodium 5 Mg Tabs (Warfarin sodium) .... Take as directed per coumadin clinic 16)  Mucinex  .... As needed 17)  Amiodarone Hcl 200 Mg Tabs (Amiodarone hcl) .... Take 1 tablet by mouth once a day 18)  Potassium Chloride Cr 10 Meq Cr-caps (Potassium chloride) .... Take one tablet by mouth daily 19)  Guaifenesin Dac 30-10-100 Mg/30ml Soln (Pseudoephedrine-codeine-gg) .... As needed 20)  Acetaminophen 325 Mg Tabs (Acetaminophen) .... As needed 21)  Robitussin Dm 100-10 Mg/85ml Syrp (Dextromethorphan-guaifenesin) .... As needed for cough  Other Orders: Est. Patient Level III (84696) DME Referral (DME)  Patient Instructions: 1)  Will set up CPAP machine 2)  Folllow up in March, 2011   Immunization History:  Influenza Immunization History:    Influenza:  historical (01/11/2009)

## 2010-06-14 NOTE — Assessment & Plan Note (Signed)
Summary: f65m   Visit Type:  Follow-up Referring Provider:  [name], [name], Dr. Arbie Cookey Primary Provider:  Michell Heinrich M.D.  CC:  Sob- cough.  History of Present Illness: Has a cough with URI that he got prior to going to [facility].  He has has a cold for a week.  it pulls him down.    Problems Prior to Update: 1)  Renal Insufficiency  (ICD-588.9) 2)  Aneurysm, Thoracic Aortic  (ICD-441.2) 3)  Pulmonary Nodule  (ICD-518.89) 4)  Cad, Artery Bypass Graft  (ICD-414.04) 5)  Hoarseness  (ICD-784.42) 6)  Abdominal Aortic Aneurysm  (ICD-441.4) 7)  Atrial Fibrillation  (ICD-427.31) 8)  Peripheral Vascular Disease  (ICD-443.9) 9)  Implantation of Defibrillator, Hx of  (ICD-V45.02) 10)  Ischemic Cardiomyopathy  (ICD-414.8) 11)  Diabetes Mellitus, Type II  (ICD-250.00) 12)  Left Ventricular Failure  (ICD-428.1) 13)  Hyperlipidemia  (ICD-272.4) 14)  Hx of Myocardial Infarction  (ICD-410.90) 15)  Hypertension  (ICD-401.9) 16)  COPD  (ICD-496) 17)  Allergic Rhinitis  (ICD-477.9)  Current Medications (verified): 1)  Symbicort 160-4.5 Mcg/act  Aero (Budesonide-Formoterol Fumarate) .... 2 Puffs Two Times A Day 2)  Spiriva Handihaler 18 Mcg  Caps (Tiotropium Bromide Monohydrate) .... Use One Inhalation Daily. 3)  Nasonex 50 Mcg/act  Susp (Mometasone Furoate) .... Two Puffs Each Nostril Daily 4)  Zyrtec Allergy 10 Mg Tabs (Cetirizine Hcl) .... Take 1 Tablet By Mouth Once A Day 5)  Adult Aspirin Ec Low Strength 81 Mg  Tbec (Aspirin) .... One Tablet Daily. 6)  Coreg 12.5 Mg  Tabs (Carvedilol) .... Take 1/2 Tablet Two Times A Day 7)  Nitro-Dur 0.4 Mg/hr  Pt24 (Nitroglycerin) .... Place Under Tongue As Needed For Chest Pain. 8)  Lasix 40 Mg  Tabs (Furosemide) .... Take 2 Tablet By Mouth Once Daily 9)  Januvia 100 Mg  Tabs (Sitagliptin Phosphate) .... Take One Tablet Daily. 10)  Lithium Carbonate 300 Mg  Caps (Lithium Carbonate) .... Take One Capsule Two Times A Day 11)   Finasteride 5 Mg Tabs (Finasteride) .Marland Kitchen.. 1 By Mouth Daily 12)  Warfarin Sodium 5 Mg Tabs (Warfarin Sodium) .... Take As Directed Per Coumadin Clinic 13)  Amiodarone Hcl 200 Mg Tabs (Amiodarone Hcl) .... Take 1 Tablet By Mouth Once A Day 14)  Potassium Chloride Cr 10 Meq Cr-Caps (Potassium Chloride) .... Take One Tablet By Mouth Daily 15)  Guaifenesin Dac 30-10-100 Mg/69ml Soln (Pseudoephedrine-Codeine-Gg) .... As Needed 16)  Acetaminophen 325 Mg  Tabs (Acetaminophen) .... As Needed 17)  Robitussin Dm 100-10 Mg/37ml Syrp (Dextromethorphan-Guaifenesin) .... As Needed For Cough 18)  Synthroid 125 Mcg Tabs (Levothyroxine Sodium) .... Take 1 Tablet By Mouth Once A Day 19)  Flomax 0.4 Mg Caps (Tamsulosin Hcl) .... Take 1 Tablet By Mouth Once A Day 20)  Xopenex Hfa 45 Mcg/act Aero (Levalbuterol Tartrate) .... Inhale 2 Puffs Four Times A Day As Needed 21)  Lotrisone 1-0.05 % Crea (Clotrimazole-Betamethasone) .... 2 X A Day 22)  Topicort 0.25 % Crea (Desoximetasone) .... As Needed 23)  Benicar 20 Mg Tabs (Olmesartan Medoxomil) .... Take One-Half Tablet By Mouth Daily 24)  Isosorbide Mononitrate Cr 30 Mg Xr24h-Tab (Isosorbide Mononitrate) .... Take One Tablet By Mouth Once Daily 25)  Cyanocobalamin 1000 Mcg/ml Soln (Cyanocobalamin) .... Once Monthly 26)  Amoxicillin 500 Mg Caps (Amoxicillin) .... 3 X A Day For 7 Days  Allergies: 1)  ! Morphine 2)  ! Lipitor 3)  ! * Ambien 4)  ! Prednisone  Vital Signs:  Patient profile:   75 year old male Height:      70.5 inches Weight:      173.50 pounds BMI:     24.63 Pulse rate:   56 / minute Pulse rhythm:   irregular Resp:     18 per minute BP sitting:   110 / 70  (left arm) Cuff size:   large  Vitals Entered By: Vikki Ports (April 16, 2010 3:54 PM)  Physical Exam  General:  Well developed, well nourished, in no acute distress. Head:  normocephalic and atraumatic Eyes:  PERRLA/EOM intact; conjunctiva and lids normal. Lungs:  Lunds are pretty  clear.  Crackles left base clear with cough.  Heart:  Normal S1 and S2.  Apical holosystolic murmur. Pulses:  pulses normal in all 4 extremities Extremities:  No clubbing or cyanosis.  No edema Neurologic:  Alert and oriented x 3.   EKG  Procedure date:  04/16/2010  Findings:      Mostly atrial tracking with ventricular pacing.   ICD Specifications Following MD:  Lewayne Bunting, MD     Referring MD:  [facility] ICD Vendor:  [employer]     ICD Model Number:  7232     ICD Serial Number:  [id number] ICD DOI:  08/28/2004     ICD Implanting MD:  Lewayne Bunting, MD  Lead 1:    Location: RV     DOI: 08/28/2004     Model #: 4098     Serial #: [id number]     Status: active Lead 2:    Location: RV     DOI: 01/17/2009     Model #: 5621     Serial #: [id number]     Status: active  Indications::  ICM   ICD Follow Up ICD Dependent:  No      Episodes Coumadin:  Yes  Brady Parameters Mode VVI     Lower Rate Limit:  55      Tachy Zones VF:  200     VT:  250 FVT VIA VF     VT1:  182     Impression & Recommendations:  Problem # 1:  CAD, ARTERY BYPASS GRAFT (ICD-414.04) stable at present.  No cardiac symptoms.   His updated medication list for this problem includes:    Adult Aspirin Ec Low Strength 81 Mg Tbec (Aspirin) ..... One tablet daily.    Coreg 12.5 Mg Tabs (Carvedilol) .Marland Kitchen... Take 1/2 tablet two times a day    Nitro-dur 0.4 Mg/hr Pt24 (Nitroglycerin) .Marland Kitchen... Place under tongue as needed for chest pain.    Warfarin Sodium 5 Mg Tabs (Warfarin sodium) .Marland Kitchen... Take as directed per coumadin clinic    Isosorbide Mononitrate Cr 30 Mg Xr24h-tab (Isosorbide mononitrate) .Marland Kitchen... Take one tablet by mouth once daily  Problem # 2:  ANEURYSM, THORACIC AORTIC (ICD-441.2) Long discussion with Dr. Arbie Cookey by phone.  He does not feel he is a good surgical candidate.  He had a long talk with the family regarding prognosis and options.    Problem # 3:  ISCHEMIC CARDIOMYOPATHY (ICD-414.8) Currently stable at  present.  Hemodynamics are ok.  Apical murmur on exam.                              His updated medication list for this problem includes:    Adult Aspirin Ec Low Strength 81 Mg Tbec (Aspirin) ..... One tablet daily.    Coreg 12.5  Mg Tabs (Carvedilol) .Marland Kitchen... Take 1/2 tablet two times a day    Nitro-dur 0.4 Mg/hr Pt24 (Nitroglycerin) .Marland Kitchen... Place under tongue as needed for chest pain.    Lasix 40 Mg Tabs (Furosemide) .Marland Kitchen... Take 2 tablet by mouth once daily    Warfarin Sodium 5 Mg Tabs (Warfarin sodium) .Marland Kitchen... Take as directed per coumadin clinic    Amiodarone Hcl 200 Mg Tabs (Amiodarone hcl) .Marland Kitchen... Take 1 tablet by mouth once a day    Benicar 20 Mg Tabs (Olmesartan medoxomil) .Marland Kitchen... Take one-half tablet by mouth daily    Isosorbide Mononitrate Cr 30 Mg Xr24h-tab (Isosorbide mononitrate) .Marland Kitchen... Take one tablet by mouth once daily  Problem # 4:  HYPERTENSION (ICD-401.9) Current situation stable.   His updated medication list for this problem includes:    Adult Aspirin Ec Low Strength 81 Mg Tbec (Aspirin) ..... One tablet daily.    Coreg 12.5 Mg Tabs (Carvedilol) .Marland Kitchen... Take 1/2 tablet two times a day    Lasix 40 Mg Tabs (Furosemide) .Marland Kitchen... Take 2 tablet by mouth once daily    Benicar 20 Mg Tabs (Olmesartan medoxomil) .Marland Kitchen... Take one-half tablet by mouth daily  Other Orders: EKG w/ Interpretation (93000)           Patient Instructions: 1)  Your physician recommends that you schedule a follow-up appointment in: 2 months--June 20, 2010 at 3:15 2)  Your physician recommends that you continue on your current medications as directed. Please refer to the Current Medication list given to you today.

## 2010-06-14 NOTE — Progress Notes (Signed)
Summary: Iron supplement.  ---- Converted from flag ---- ---- 06/08/2010 10:57 AM, Elizebeth Brooking McGowen M.D. wrote: I forgot to tell him to start taking iron replacement: please tell him to get ferrous sulfate 325mg  tabs OTC, take 1 tab two times a day between meals if possible.  Take with meals IF they tend to make his stomach upset. Thx ------------------------------  Phone Note Outgoing Call   Call placed by: Francee Piccolo CMA Duncan Dull),  June 08, 2010 5:16 PM Summary of Call: notified patent and wife.  they voice understandign and are agreeable. Initial call taken by: Francee Piccolo CMA Duncan Dull),  June 08, 2010 5:16 PM

## 2010-06-14 NOTE — Progress Notes (Signed)
Summary: Xopenex PA---APPROVED   Phone Note Call from Patient Call back at [phone]   Caller: Patient Call For: sood Reason for Call: Refill Medication, Talk to Nurse Summary of Call: patient needs a refill on xopenex but insurance may not pay for it. if they dont the will need a "yellow puffer"  today. she needs to talk to a nurse about this.   [facility] [place] Initial call taken by: Valinda Hoar,  January 12, 2010 1:04 PM  Follow-up for Phone Call        Pt states insurance would not pay for xopenex inahler, so he tried proventil inhaler and it is not helping him as well as xopenex did, so he is requesting a PA be done for xopenex. I called the pahrmacy to get PA number but the number they gave me [facility] and they did not have pt on file there. So icalled the pharmacy back and they have the pt under a different birthday. They have him under [birth year]. So I requested a fax for PA for xopenex and placed it in VS look-at to complete. I also called the pt to make him aware of the problem.   Carron Curie CMA  January 12, 2010 3:40 PM   Additional Follow-up for Phone Call Additional follow up Details #1::        PA form filled out and faxed back to [facility] Will await reponse. Form placed back in triage.Michel Bickers [facility]  January 17, 2010 11:43 AM  Xopenex HFA approved through 01/17/2011. Pharmacy notified. LMOMfor pt to call.Approval given to [facility] to scan into EMR. Additional Follow-up by: Michel Bickers CMA,  January 17, 2010 12:18 PM    Additional Follow-up for Phone Call Additional follow up Details #2::    called spoke with patient, informed his that the xopenex will be paid for by ins through this time next year.  pt verbalized his understanding. Boone Master CNA/MA  January 17, 2010 3:43 PM

## 2010-06-14 NOTE — Medication Information (Signed)
Summary: rov/sl  Anticoagulant Therapy  Managed by: Cloyde Reams, RN, BSN Referring MD: [name] MD PCP: Corrin Parker Supervising MD: Graciela Husbands MD, Viviann Spare Indication 1: Atrial Fibrillation (ICD-427.31) Lab Used: [facility] Site: [facility] INR POC 3.0 INR RANGE 2 - 3   Health status changes: no    Bleeding/hemorrhagic complications: no    Recent/future hospitalizations: no    Any changes in medication regimen? yes       Details: Topical cream for foot.   Recent/future dental: no  Any missed doses?: yes     Details: Missed 1 dosage 2 weeks ago.   Is patient compliant with meds? yes       Allergies: 1)  ! Morphine 2)  ! Lipitor 3)  ! * Ambien 4)  ! Prednisone  Anticoagulation Management History:      The patient is taking warfarin and comes in today for a routine follow up visit.  Positive risk factors for bleeding include an age of 73 years or older and presence of serious comorbidities.  The bleeding index is 'intermediate risk'.  Positive CHADS2 values include History of CHF, History of HTN, and History of Diabetes.  Negative CHADS2 values include Age > 73 years old.  The start date was 05/29/2007.  His last INR was 8.4 ratio.  Anticoagulation responsible provider: Graciela Husbands MD, Viviann Spare.  INR POC: 3.0.  Cuvette Lot#: [id number].  Exp: 02/2011.    Anticoagulation Management Assessment/Plan:      The patient's current anticoagulation dose is Warfarin sodium 5 mg tabs: take as directed per coumadin clinic.  The target INR is 2 - 3.  The next INR is due 02/01/2010.  Anticoagulation instructions were given to patient.  Results were reviewed/authorized by Cloyde Reams, RN, BSN.  He was notified by Cloyde Reams, RN, BSN.         Prior Anticoagulation Instructions: INR 2.7  Continue Coumadin 0.5 tab (2.5 mg) all days except Coumadin 1 tab (5 mg) on Tues and Fri. Return to clinic in 3 weeks.   Current Anticoagulation Instructions: INR 3.0  Take 1/2 tablet tomorrow, then  resume same dosage 1/2 tablet daily except 1 tablet on Tuesdays and Fridays.

## 2010-06-14 NOTE — Assessment & Plan Note (Signed)
Summary: device/saf   Visit Type:  Follow-up Referring Provider:  [name], [name], Dr. Arbie Cookey Primary Provider:  Elizebeth Brooking McGowen M.D.   History of Present Illness: [name] returns today for followup.  He is a pleasant 75 yo man with a h/o ICM, CHF, and atrial fibrillation.  He also has NSVT.  He denies any intercurrent ICD therapies. No c/p or sob or peripheral edema.  Current Medications (verified): 1)  Symbicort 160-4.5 Mcg/act  Aero (Budesonide-Formoterol Fumarate) .... 2 Puffs Two Times A Day 2)  Spiriva Handihaler 18 Mcg  Caps (Tiotropium Bromide Monohydrate) .... Use One Inhalation Daily. 3)  Nasonex 50 Mcg/act  Susp (Mometasone Furoate) .... Two Puffs Each Nostril Daily 4)  Zyrtec Allergy 10 Mg Tabs (Cetirizine Hcl) .... Take 1 Tablet By Mouth Once A Day 5)  Adult Aspirin Ec Low Strength 81 Mg  Tbec (Aspirin) .... One Tablet Daily. 6)  Coreg 12.5 Mg  Tabs (Carvedilol) .... Take 1/2 Tablet Two Times A Day 7)  Nitro-Dur 0.4 Mg/hr  Pt24 (Nitroglycerin) .... Place Under Tongue As Needed For Chest Pain. 8)  Lasix 40 Mg  Tabs (Furosemide) .... Take 1tablet By Mouth Two Times A Day 9)  Januvia 100 Mg  Tabs (Sitagliptin Phosphate) .... Take One Tablet Daily. 10)  Lithium Carbonate 300 Mg  Caps (Lithium Carbonate) .... Take One Capsule Two Times A Day 11)  Finasteride 5 Mg Tabs (Finasteride) .Marland Kitchen.. 1 By Mouth Daily 12)  Warfarin Sodium 5 Mg Tabs (Warfarin Sodium) .... Take As Directed Per Coumadin Clinic 13)  Amiodarone Hcl 200 Mg Tabs (Amiodarone Hcl) .... Take 1 Tablet By Mouth Once A Day 14)  Potassium Chloride Cr 10 Meq Cr-Caps (Potassium Chloride) .... Take One Tablet By Mouth Daily 15)  Guaifenesin Dac 30-10-100 Mg/66ml Soln (Pseudoephedrine-Codeine-Gg) .... As Needed 16)  Acetaminophen 325 Mg  Tabs (Acetaminophen) .... As Needed 17)  Robitussin Dm 100-10 Mg/19ml Syrp (Dextromethorphan-Guaifenesin) .... As Needed For Cough 18)  Synthroid 125 Mcg Tabs (Levothyroxine  Sodium) .... Take 1 Tablet By Mouth Once A Day 19)  Flomax 0.4 Mg Caps (Tamsulosin Hcl) .... Take 1 Tablet By Mouth Once A Day 20)  Xopenex Hfa 45 Mcg/act Aero (Levalbuterol Tartrate) .... Inhale 2 Puffs Four Times A Day As Needed 21)  Lotrisone 1-0.05 % Crea (Clotrimazole-Betamethasone) .... 2 X A Day 22)  Topicort 0.25 % Crea (Desoximetasone) .... As Needed 23)  Benicar 20 Mg Tabs (Olmesartan Medoxomil) .... Take One-Half Tablet By Mouth Daily 24)  Isosorbide Mononitrate Cr 30 Mg Xr24h-Tab (Isosorbide Mononitrate) .... Take One Tablet By Mouth Once Daily 25)  Cyanocobalamin 1000 Mcg/ml Soln (Cyanocobalamin) .... Once Monthly 26)  Xopenex Hfa 45 Mcg/act Aero (Levalbuterol Tartrate) .... Uad  Allergies: 1)  ! Morphine 2)  ! Lipitor 3)  ! * Ambien 4)  ! Prednisone  Past History:  Past Medical History: Last updated: 10/02/2009 CAD (ICD-414.00)- post coronary artery bypass graft surgery with prior percutaneos intervention of the saphenous vein graft with known saphenous vein graft disease. Abdominal aortic aneurysm      - 5.8 cm abd u/s Nov 2010 Descending thoracic aorta aneurysm Bilateral popliteal artery aneurysm CAD s/p MI Ischemic cardiomyopathy with EF 40% Paroxysmal atrial fibrillation HTN Peripheral vascular disease Dyslipidemia Diabetes mellitus GERD Hiatal hernia Diverticular disease BPH COPD      - 05/09/08 PFT FEV1 1.84 (55%), FVC 4.42 (95%), TLC 6.46 (92%), DLCO 72%, no BD response Pneumonia January, 2009 Allergic rhinitis Shingles OSA      -  PSG 06/14/09 RDI 17, PLMI 96      - Intolerant of CPAP or BPAP  Past Surgical History: Last updated: 02/15/2009 Insertion of a new rate sensing pacing lead with  removal of a previous implanted ICD and insertion of device back in the pocket with defibrillation threshold testing.   Partial colectomy 1995 CABG 1996 Aorto-bifemoral bypass 1999 by [name] Early implantable cardio defibrillator August 28, 2004 by [name] stents placed to the saphenous vein graft and also atrial fibrillation. radiofrequency ablation in 2006. Nasal surgery  Review of Systems  The patient denies chest pain, syncope, dyspnea on exertion, and peripheral edema.    Vital Signs:  Patient profile:   75 year old male Height:      70.5 inches Weight:      175 pounds BMI:     24.84 Pulse rate:   60 / minute BP sitting:   112 / 60  (left arm)  Vitals Entered By: Laurance Flatten CMA (April 24, 2010 2:44 PM)  Physical Exam  General:  Well developed, well nourished, in no acute distress. Head:  normocephalic and atraumatic Eyes:  PERRLA/EOM intact; conjunctiva and lids normal. Mouth:  no deformity or lesions Neck:  no JVD.   Chest Wall:  Normal ICD incision. Lungs:  Lunds are pretty clear.  Crackles left base clear with cough.  Heart:  Normal S1 and S2.  Apical holosystolic murmur. Abdomen:  Bowel sounds positive; abdomen soft and non-tender without masses, organomegaly, or hernias noted. No hepatosplenomegaly. Msk:  Back normal, normal gait. Muscle strength and tone normal. Pulses:  pulses normal in all 4 extremities Extremities:  No clubbing or cyanosis.  No edema Neurologic:  Alert and oriented x 3.    ICD Specifications Following MD:  Lewayne Bunting, MD     Referring MD:  [facility] ICD Vendor:  [employer]     ICD Model Number:  7232     ICD Serial Number:  [id number] ICD DOI:  08/28/2004     ICD Implanting MD:  Lewayne Bunting, MD  Lead 1:    Location: RV     DOI: 08/28/2004     Model #: 4098     Serial #: [id number]     Status: active Lead 2:    Location: RV     DOI: 01/17/2009     Model #: 5621     Serial #: [id number]     Status: active  Indications::  ICM   ICD Follow Up Remote Check?  No Battery Voltage:  3.00 V     Charge Time:  8.32 seconds     ICD Dependent:  No       ICD Device Measurements Right Ventricle:  Amplitude: 7.4 mV, Impedance: 424 ohms, Threshold: 1.0 V at 0.3 msec Shock Impedance: 46/61  ohms   Episodes Coumadin:  Yes Shock:  0     ATP:  0     Nonsustained:  0     Ventricular Pacing:  34.3%  Brady Parameters Mode VVI     Lower Rate Limit:  55      Tachy Zones VF:  200     VT:  250 FVT VIA VF     VT1:  182     Next Cardiology Appt Due:  07/12/2010 Tech Comments:  No parameter changes.  Device function noramal.  Checked by industry.  ROV 3 months clinic. Altha Harm, LPN  April 24, 2010 3:00 PM  MD Comments:  Normal device  function.  Impression & Recommendations:  Problem # 1:  IMPLANTATION OF DEFIBRILLATOR, HX OF (ICD-V45.02) His device is working normally. Will recheck in several months.  Problem # 2:  CAD, ARTERY BYPASS GRAFT (ICD-414.04) He denies anginal symptoms. Will follow. His updated medication list for this problem includes:    Adult Aspirin Ec Low Strength 81 Mg Tbec (Aspirin) ..... One tablet daily.    Coreg 12.5 Mg Tabs (Carvedilol) .Marland Kitchen... Take 1/2 tablet two times a day    Nitro-dur 0.4 Mg/hr Pt24 (Nitroglycerin) .Marland Kitchen... Place under tongue as needed for chest pain.    Warfarin Sodium 5 Mg Tabs (Warfarin sodium) .Marland Kitchen... Take as directed per coumadin clinic    Isosorbide Mononitrate Cr 30 Mg Xr24h-tab (Isosorbide mononitrate) .Marland Kitchen... Take one tablet by mouth once daily  Orders: TLB-Sedimentation Rate (ESR) (85652-ESR)  Problem # 3:  ATRIAL FIBRILLATION (ICD-427.31) He denies palpitations.  He is on coumadin. He does have a cough. Will check ESR. His updated medication list for this problem includes:    Adult Aspirin Ec Low Strength 81 Mg Tbec (Aspirin) ..... One tablet daily.    Coreg 12.5 Mg Tabs (Carvedilol) .Marland Kitchen... Take 1/2 tablet two times a day    Warfarin Sodium 5 Mg Tabs (Warfarin sodium) .Marland Kitchen... Take as directed per coumadin clinic    Amiodarone Hcl 200 Mg Tabs (Amiodarone hcl) .Marland Kitchen... Take 1 tablet by mouth once a day  Patient Instructions: 1)  Your physician recommends that you schedule a follow-up appointment in: 3 months in the device clinic and  6 months with Dr Ladona Ridgel 2)  Your physician recommends that you return for lab work today

## 2010-06-14 NOTE — Assessment & Plan Note (Signed)
Summary: eph/per dr Riley Kill must be seen   Visit Type:  post-hospital Referring Provider:  [name], [name] Primary Provider:  Corrin Parker  CC:  SOB.  History of Present Illness: Was very weak when he went home, but doing much better now.   He is out more now.  Denies chest pain.  Has moderate shortness of breath.Had urinary retention in the hospital and had to haveFoley placed.  Urinary retention has resolved. Underwent cath and had new occlusion of SVG to OM.    Current Medications (verified): 1)  Symbicort 160-4.5 Mcg/act  Aero (Budesonide-Formoterol Fumarate) .... 2 Puffs Two Times A Day 2)  Spiriva Handihaler 18 Mcg  Caps (Tiotropium Bromide Monohydrate) .... Use One Inhalation Daily. 3)  Xopenex Hfa 45 Mcg/act Aero (Levalbuterol Tartrate) .... Two Puffs Qid As Needed 4)  Nasonex 50 Mcg/act  Susp (Mometasone Furoate) .... Two Puffs Each Nostril Daily 5)  Zyrtec Allergy 10 Mg Tabs (Cetirizine Hcl) .... Take 1 Tablet By Mouth Once A Day 6)  Adult Aspirin Ec Low Strength 81 Mg  Tbec (Aspirin) .... One Tablet Daily. 7)  Coreg 12.5 Mg  Tabs (Carvedilol) .... Take 1/2 Tablet Two Times A Day 8)  Nitro-Dur 0.4 Mg/hr  Pt24 (Nitroglycerin) .... Place Under Tongue As Needed For Chest Pain. 9)  Lasix 40 Mg  Tabs (Furosemide) .... Take 1 Tablet By Mouth Two Times A Day 10)  Januvia 100 Mg  Tabs (Sitagliptin Phosphate) .... Take One Tablet Daily. 11)  Lithium Carbonate 300 Mg  Caps (Lithium Carbonate) .... Take One Capsule Two Times A Day 12)  Finasteride 5 Mg Tabs (Finasteride) .Marland Kitchen.. 1 By Mouth Daily 13)  Warfarin Sodium 5 Mg Tabs (Warfarin Sodium) .... Take As Directed Per Coumadin Clinic 14)  Mucinex .... As Needed 15)  Amiodarone Hcl 200 Mg Tabs (Amiodarone Hcl) .... Take 1 Tablet By Mouth Once A Day 16)  Potassium Chloride Cr 10 Meq Cr-Caps (Potassium Chloride) .... Take One Tablet By Mouth Daily 17)  Guaifenesin Dac 30-10-100 Mg/36ml Soln (Pseudoephedrine-Codeine-Gg) .... As  Needed 18)  Acetaminophen 325 Mg  Tabs (Acetaminophen) .... As Needed 19)  Robitussin Dm 100-10 Mg/58ml Syrp (Dextromethorphan-Guaifenesin) .... As Needed For Cough 20)  Synthroid 125 Mcg Tabs (Levothyroxine Sodium) .... Take 1 Tablet By Mouth Once A Day 21)  Flomax 0.4 Mg Caps (Tamsulosin Hcl) .... Take 1 Tablet By Mouth Once A Day X 2 Weeks 22)  Lotrisone 1-0.05 % Crea (Clotrimazole-Betamethasone) .... Two Times A Day 23)  Topicort 0.25 % Crea (Desoximetasone) .... Two Times A Day  Allergies: 1)  ! Morphine 2)  ! Lipitor 3)  ! * Ambien 4)  ! Prednisone  Past History:  Past Medical History: Last updated: 10/02/2009 CAD (ICD-414.00)- post coronary artery bypass graft surgery with prior percutaneos intervention of the saphenous vein graft with known saphenous vein graft disease. Abdominal aortic aneurysm      - 5.8 cm abd u/s Nov 2010 Descending thoracic aorta aneurysm Bilateral popliteal artery aneurysm CAD s/p MI Ischemic cardiomyopathy with EF 40% Paroxysmal atrial fibrillation HTN Peripheral vascular disease Dyslipidemia Diabetes mellitus GERD Hiatal hernia Diverticular disease BPH COPD      - 05/09/08 PFT FEV1 1.84 (55%), FVC 4.42 (95%), TLC 6.46 (92%), DLCO 72%, no BD response Pneumonia January, 2009 Allergic rhinitis Shingles OSA      - PSG 06/14/09 RDI 17, PLMI 96      - Intolerant of CPAP or BPAP  Vital Signs:  Patient profile:   75  year old male Height:      71 inches Weight:      171 pounds BMI:     23.94 Pulse rate:   54 / minute Pulse rhythm:   regular Resp:     20 per minute BP sitting:   110 / 70  (left arm) Cuff size:   large  Vitals Entered By: Vikki Ports (November 21, 2009 2:45 PM)  Physical Exam  General:  Well developed, well nourished, in no acute distress. Head:  normocephalic and atraumatic Eyes:  PERRLA/EOM intact; conjunctiva and lids normal. Lungs:  Clear bilaterally to auscultation and percussion. Heart:  Apical systolic murmur  of MR.  Abdomen:  Bowel sounds positive; abdomen soft and non-tender without masses, organomegaly, or hernias noted. No hepatosplenomegaly. Extremities:  No clubbing or cyanosis. Neurologic:  Alert and oriented x 3.   EKG  Procedure date:  11/21/2009  Findings:      NSR.  Anterolateral T inversion, consistent with recent MI.  Leftward axis deviation.   Echocardiogram  Procedure date:  11/08/2009  Findings:       Study Conclusions    - Left ventricle: The cavity size was mildly dilated. Wall thickness     was increased in a pattern of mild LVH. Systolic function was     severely reduced. The estimated ejection fraction was in the range     of 20% to 25%. The posterior wall appears severely hypokinetic to     akinetic, the anteroseptal wall is akinetic, the apex is akinetic.     Other walls are not well-visualized. Doppler parameters are     consistent with abnormal left ventricular relaxation (grade 1     diastolic dysfunction).   - Mitral valve: Mild regurgitation.   - Left atrium: The atrium was mildly dilated.   - Right ventricle: The cavity size was normal. Pacer wire or     catheter noted in right ventricle. Systolic function was mildly     reduced.   - Right atrium: The atrium was mildly dilated.   - Pulmonary arteries: PA peak pressure: 29mm Hg (S).   - Inferior vena cava: The vessel was normal in size; the     respirophasic diameter changes were in the normal range (= 50%);     findings are consistent with normal central venous pressu  Cardiac Cath  Procedure date:  11/06/2009  Findings:       CONCLUSIONS:   1. Coronary artery disease status post coronary bypass graft surgery       in 1996.   2. Severe native vessel disease with 40% narrowing in the proximal       left anterior descending, 70% narrowing in the proximal circumflex       artery with multiple 90% stenoses in the second marginal branch,       total occlusion of the first marginal branch, and 95%  stenosis in       the posterolateral branch, and total occlusion of the right       coronary artery.   3. Occluded vein graft to the circumflex artery which is new since the       last study, occlusion of the vein graft to the right coronary which       is old, patent vein graft to diagonal branch of the left anterior       descending with 40% distal stenosis, and a patent left internal       mammary artery graft to  the left anterior descending.   4. Severe left ventricular function by noninvasive studies.      RECOMMENDATIONS:  Since the last study, the patient has occluded vein   graft to the circumflex artery which probably represents the culprit   lesion.  This does not appear to be favorable for percutaneous   intervention.  We will plan continued medical therapy.          ICD Specifications Following MD:  Lewayne Bunting, MD     Referring MD:  [facility] ICD Vendor:  [employer]     ICD Model Number:  7232     ICD Serial Number:  [id number] ICD DOI:  08/28/2004     ICD Implanting MD:  Lewayne Bunting, MD  Lead 1:    Location: RV     DOI: 08/28/2004     Model #: 4098     Serial #: [id number]     Status: active Lead 2:    Location: RV     DOI: 01/17/2009     Model #: 5621     Serial #: [id number]     Status: active  Indications::  ICM   ICD Follow Up ICD Dependent:  No      Episodes Coumadin:  Yes  Brady Parameters Mode VVI     Lower Rate Limit:  55      Tachy Zones VF:  200     VT:  250 FVT VIA VF     VT1:  182     Impression & Recommendations:  Problem # 1:  CAD, ARTERY BYPASS GRAFT (ICD-414.04) stable at present.  No chest pain but some shortness of breath.  Recent graft occlusion.  Reviewed with Dr. Juanda Chance.  We agree on medical therapy.  His updated medication list for this problem includes:    Adult Aspirin Ec Low Strength 81 Mg Tbec (Aspirin) ..... One tablet daily.    Coreg 12.5 Mg Tabs (Carvedilol) .Marland Kitchen... Take 1/2 tablet two times a day    Nitro-dur 0.4 Mg/hr Pt24  (Nitroglycerin) .Marland Kitchen... Place under tongue as needed for chest pain.    Warfarin Sodium 5 Mg Tabs (Warfarin sodium) .Marland Kitchen... 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 Arbie Cookey extensively.  Problem # 3:  IMPLANTATION OF DEFIBRILLATOR, HX OF (ICD-V45.02) Followed in EP  Problem # 4:  ISCHEMIC CARDIOMYOPATHY (ICD-414.8) hemodynamics stable, but EF down to 20-25%.   His updated medication list for this problem includes:    Adult Aspirin Ec Low Strength 81 Mg Tbec (Aspirin) ..... One tablet daily.    Coreg 12.5 Mg Tabs (Carvedilol) .Marland Kitchen... Take 1/2 tablet two times a day    Nitro-dur 0.4 Mg/hr Pt24 (Nitroglycerin) .Marland Kitchen... Place under tongue as needed for chest pain.    Lasix 40 Mg Tabs (Furosemide) .Marland Kitchen... Take 1 tablet by mouth two times a day    Warfarin Sodium 5 Mg Tabs (Warfarin sodium) .Marland Kitchen... Take as directed per coumadin clinic    Amiodarone Hcl 200 Mg Tabs (Amiodarone hcl) .Marland Kitchen... Take 1 tablet by mouth once a day  Problem # 5:  HYPERLIPIDEMIA (ICD-272.4) treated by Dr. Dagoberto Ligas.  Patient Instructions: 1)  Your physician recommends that you schedule a follow-up appointment in: 1 MONTH 2)  Your physician recommends that you have lab work today: BMP 3)  Your physician recommends that you continue on your current medications as directed. Please refer  to the Current Medication list given to you today.

## 2010-06-14 NOTE — Assessment & Plan Note (Signed)
Summary: f/u 5-7 days/vfw   Vital Signs:  Patient profile:   75 year old male Height:      70.5 inches Weight:      169 pounds BMI:     23.99 Temp:     97.6 degrees F oral Pulse rate:   55 / minute BP sitting:   122 / 76  (right arm) Cuff size:   regular  Vitals Entered By: Francee Piccolo CMA Duncan Dull) (June 08, 2010 9:49 AM) CC: follow up labs, pt is feeling well Is Patient Diabetic? Yes Did you bring your meter with you today? No   History of Present Illness: 75 y/o WM here for f/u of fatigue s/p acute exacerbation of COPD. Feeling better in the last week, appetite up some.  No coughing.  No new symptoms. Describes stable DOE (25-30 steps), no chest, abd, or back pain. Home glucose checks, fasting 112-116 range.  Of note, he has hx of prostate cancer, localized, no intervention/treatment being done.  Gets regular f/u at Urology ([name]).  Most recent f/u about 2-3 months ago--good report per pt.  No records available at this time.  On lab testing recently, he has normocytic anemia (mild): Hb stable around 11.8.   Iron testing low.  Hemoccults done per pt and mailed but no results yet. He denies BRBPR, melena, or change in bowel habits.  Has hx of diverticular dz and has had about 12 inches of colon removed per his report. He is on coumadin and 81mg  aspirin but takes no NSAIDs.  Current Medications (verified): 1)  Symbicort 160-4.5 Mcg/act  Aero (Budesonide-Formoterol Fumarate) .... 2 Puffs Two Times A Day 2)  Spiriva Handihaler 18 Mcg  Caps (Tiotropium Bromide Monohydrate) .... Use One Inhalation Daily. 3)  Nasonex 50 Mcg/act  Susp (Mometasone Furoate) .... Two Puffs Each Nostril Daily 4)  Zyrtec Allergy 10 Mg Tabs (Cetirizine Hcl) .... Take 1 Tablet By Mouth Once A Day 5)  Adult Aspirin Ec Low Strength 81 Mg  Tbec (Aspirin) .... One Tablet Daily. 6)  Coreg 12.5 Mg  Tabs (Carvedilol) .... Take 1/2 Tablet Two Times A Day 7)  Nitro-Dur 0.4 Mg/hr  Pt24 (Nitroglycerin)  .... Place Under Tongue As Needed For Chest Pain. 8)  Lasix 40 Mg  Tabs (Furosemide) .... Take 1tablet By Mouth Two Times A Day 9)  Januvia 100 Mg  Tabs (Sitagliptin Phosphate) .... Take One Tablet Daily. 10)  Lithium Carbonate 300 Mg  Caps (Lithium Carbonate) .... Take One Capsule Two Times A Day 11)  Finasteride 5 Mg Tabs (Finasteride) .Marland Kitchen.. 1 By Mouth Daily 12)  Warfarin Sodium 5 Mg Tabs (Warfarin Sodium) .... Take As Directed Per [facility] 13)  Amiodarone Hcl 200 Mg Tabs (Amiodarone Hcl) .... Take 1 Tablet By Mouth Once A Day 14)  Potassium Chloride Cr 10 Meq Cr-Caps (Potassium Chloride) .... Take One Tablet By Mouth Daily 15)  Guaifenesin Dac 30-10-100 Mg/59ml Soln (Pseudoephedrine-Codeine-Gg) .... As Needed 16)  Acetaminophen 325 Mg  Tabs (Acetaminophen) .... As Needed 17)  Robitussin Dm 100-10 Mg/80ml Syrp (Dextromethorphan-Guaifenesin) .... As Needed For Cough 18)  Synthroid 125 Mcg Tabs (Levothyroxine Sodium) .... Take 1 Tablet By Mouth Once A Day Alt With 1/2 Tab By Mouth Once Daily 19)  Flomax 0.4 Mg Caps (Tamsulosin Hcl) .... Take 1 Tablet By Mouth Once A Day 20)  Xopenex Hfa 45 Mcg/act Aero (Levalbuterol Tartrate) .... Inhale 2 Puffs Four Times A Day As Needed 21)  Lotrisone 1-0.05 % Crea (Clotrimazole-Betamethasone) .... 2  X A Day 22)  Topicort 0.25 % Crea (Desoximetasone) .... As Needed 23)  Benicar 20 Mg Tabs (Olmesartan Medoxomil) .... Take One-Half Tablet By Mouth Daily 24)  Isosorbide Mononitrate Cr 30 Mg Xr24h-Tab (Isosorbide Mononitrate) .... Take One Tablet By Mouth Once Daily 25)  Cyanocobalamin 1000 Mcg/ml Soln (Cyanocobalamin) .... Once Monthly  Allergies (verified): 1)  ! Morphine 2)  ! Lipitor 3)  ! * Ambien 4)  ! Prednisone  Past History:  Past Medical History: Reviewed history from 06/07/2010 and no changes required. CAD (ICD-414.00)- post coronary artery bypass graft surgery with prior percutaneos intervention of the saphenous vein graft with known  saphenous vein graft disease (cath 10/2009). 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)      - 5.8 cm abd u/s Nov 2010 Descending thoracic aorta aneurysm Bilateral popliteal artery aneurysm CAD s/p MI Ischemic cardiomyopathy with EF 25% (echo 10/2009) Paroxysmal atrial fibrillation (tachy/brady syndrome: has pacemaker and defibrillator) Chronic renal insufficiency (baseline Cr 1.5) HTN Peripheral vascular disease Dyslipidemia Diabetes mellitus GERD Hiatal hernia Diverticular disease BPH COPD      - 05/09/08 PFT FEV1 1.84 (55%), FVC 4.42 (95%), TLC 6.46 (92%), DLCO 72%, no BD response Pulmonary nodule (left base): resolved on f/u CT 03/02/10 Allergic rhinitis Shingles OSA      - PSG 06/14/09 RDI 17, PLMI 96      - Intolerant of CPAP or BPAP  Past Surgical History: Reviewed history from 06/07/2010 and no changes required. Insertion of a new rate sensing pacing lead with  removal of a previous implanted ICD and insertion of device back in the pocket with defibrillation threshold testing.   Partial colectomy 1995 CABG 1996 AAA repair and graft 2000 Aorto-bifemoral bypass 1999 by [name] Early Implantable cardio defibrillator August 28, 2004 by [name] Stents placed to the saphenous vein graft Radiofrequency ablation (A flutter) in 2006. Nasal surgery  Review of Systems       see HPI  Physical Exam  General:  VS: noted, all normal. Gen: Alert, well appearing, oriented x 4. HEENT: nose without congestion/injection/mucous. Oropharynx with pink mucosa, no focal lesion. Neck: supple, no LAD, TM, or mass. Supraclavicular regions without LAD. Chest: symmetric expansion, with nonlabored respirations.  Clear and equal breath sounds in all lung fields.   CV: RRR, no m/r/g.  Peripheral pulses 2+/symmetric. ABD: soft, mild TTP in LLQ, mid epigastrum, and RLQ, without rebound or guarding.  No distention. BS normal.  No bruit or mass  palpable, no HSM. EXT: no clubbing, cyanosis, or edema.     Impression & Recommendations:  Problem # 1:  FATIGUE, ACUTE (ICD-780.79) Assessment Improved  Problem # 2:  IRON DEFICIENCY (ICD-280.9) Assessment: Unchanged Awaiting hemoccult card results.   Will refer to GI for further evaluation.  Start iron replacement by mouth.  His updated medication list for this problem includes:    Cyanocobalamin 1000 Mcg/ml Soln (Cyanocobalamin) ..... Once monthly    Ferrous Sulfate 325 (65 Fe) Mg Tabs (Ferrous sulfate) .Marland Kitchen... 1 tab by mouth bid  Orders: Gastroenterology Referral (GI)  Problem # 3:  UNSPECIFIED ANEMIA (ICD-285.9) Assessment: Unchanged Multifactorial (B12 def, iron def).  Hb has been stable.  Cont. monthly B12 injections, start iron replacement. His updated medication list for this problem includes:    Cyanocobalamin 1000 Mcg/ml Soln (Cyanocobalamin) ..... Once monthly    Ferrous Sulfate 325 (65 Fe) Mg Tabs (Ferrous sulfate) .Marland Kitchen... 1 tab by mouth bid  Orders: Gastroenterology Referral (GI)  Problem # 4:  HYPOTHYROIDISM (ICD-244.9) Assessment: Deteriorated Recent TSH slightly low, so we've adjusted his dosing to what is noted below.  Recheck TSH 6 wks. We verified with his pharmacy that no change in his med dispensation has taken place (he is getting generic synthroid and has always gotten this). His updated medication list for this problem includes:    Synthroid 125 Mcg Tabs (Levothyroxine sodium) .Marland Kitchen... Take 1 tablet by mouth once a day alt with 1/2 tab by mouth once daily  Problem # 5:  DIABETES MELLITUS, TYPE II (ICD-250.00) Assessment: Unchanged Stable. His updated medication list for this problem includes:    Adult Aspirin Ec Low Strength 81 Mg Tbec (Aspirin) ..... One tablet daily.    Januvia 100 Mg Tabs (Sitagliptin phosphate) .Marland Kitchen... Take one tablet daily.    Benicar 20 Mg Tabs (Olmesartan medoxomil) .Marland Kitchen... Take one-half tablet by mouth daily  Problem # 6:  PROSTATE  CANCER (ICD-185) Assessment: Unchanged Localized...watchful waiting approach.  Will get urology records for review and placement in chart.  Complete Medication List: 1)  Symbicort 160-4.5 Mcg/act Aero (Budesonide-formoterol fumarate) .... 2 puffs two times a day 2)  Spiriva Handihaler 18 Mcg Caps (Tiotropium bromide monohydrate) .... Use one inhalation daily. 3)  Nasonex 50 Mcg/act Susp (Mometasone furoate) .... Two puffs each nostril daily 4)  Zyrtec Allergy 10 Mg Tabs (Cetirizine hcl) .... Take 1 tablet by mouth once a day 5)  Adult Aspirin Ec Low Strength 81 Mg Tbec (Aspirin) .... One tablet daily. 6)  Coreg 12.5 Mg Tabs (Carvedilol) .... Take 1/2 tablet two times a day 7)  Nitro-dur 0.4 Mg/hr Pt24 (Nitroglycerin) .... Place under tongue as needed for chest pain. 8)  Lasix 40 Mg Tabs (Furosemide) .... Take 1tablet by mouth two times a day 9)  Januvia 100 Mg Tabs (Sitagliptin phosphate) .... Take one tablet daily. 10)  Lithium Carbonate 300 Mg Caps (Lithium carbonate) .... Take one capsule two times a day 11)  Finasteride 5 Mg Tabs (Finasteride) .Marland Kitchen.. 1 by mouth daily 12)  Warfarin Sodium 5 Mg Tabs (Warfarin sodium) .... Take as directed per coumadin clinic 13)  Amiodarone Hcl 200 Mg Tabs (Amiodarone hcl) .... Take 1 tablet by mouth once a day 14)  Potassium Chloride Cr 10 Meq Cr-caps (Potassium chloride) .... Take one tablet by mouth daily 15)  Guaifenesin Dac 30-10-100 Mg/67ml Soln (Pseudoephedrine-codeine-gg) .... As needed 16)  Acetaminophen 325 Mg Tabs (Acetaminophen) .... As needed 17)  Robitussin Dm 100-10 Mg/86ml Syrp (Dextromethorphan-guaifenesin) .... As needed for cough 18)  Synthroid 125 Mcg Tabs (Levothyroxine sodium) .... Take 1 tablet by mouth once a day alt with 1/2 tab by mouth once daily 19)  Flomax 0.4 Mg Caps (Tamsulosin hcl) .... Take 1 tablet by mouth once a day 20)  Xopenex Hfa 45 Mcg/act Aero (Levalbuterol tartrate) .... Inhale 2 puffs four times a day as needed 21)   Lotrisone 1-0.05 % Crea (Clotrimazole-betamethasone) .... 2 x a day 22)  Topicort 0.25 % Crea (Desoximetasone) .... As needed 23)  Benicar 20 Mg Tabs (Olmesartan medoxomil) .... Take one-half tablet by mouth daily 24)  Isosorbide Mononitrate Cr 30 Mg Xr24h-tab (Isosorbide mononitrate) .... Take one tablet by mouth once daily 25)  Cyanocobalamin 1000 Mcg/ml Soln (Cyanocobalamin) .... Once monthly 26)  Ferrous Sulfate 325 (65 Fe) Mg Tabs (Ferrous sulfate) .Marland Kitchen.. 1 tab by mouth bid   Patient Instructions: 1)  We'll call you with details of your Gastroenterology appointment. 2)  F/u in 6 weeks with me.  Orders Added: 1)  Gastroenterology Referral [GI] 2)  Est. Patient Level III [04540]    Prevention & Chronic Care Immunizations   Influenza vaccine: Historical  (02/09/2010)    Tetanus booster: Not documented    Pneumococcal vaccine: Not documented    H. zoster vaccine: Not documented  Colorectal Screening   Hemoccult: Not documented    Colonoscopy: Not documented  Other Screening   PSA: Not documented   Smoking status: quit  (04/11/2010)  Diabetes Mellitus   HgbA1C: 6.4  (05/17/2010)    Eye exam: Not documented    Foot exam: Not documented   High risk foot: Not documented   Foot care education: Not documented    Urine microalbumin/creatinine ratio: Not documented  Lipids   Total Cholesterol: 166  (05/17/2010)   LDL: 101  (05/17/2010)   LDL Direct: Not documented   HDL: 46.10  (05/17/2010)   Triglycerides: 95.0  (05/17/2010)    SGOT (AST): 19  (06/01/2010)   SGPT (ALT): 22  (06/01/2010)   Alkaline phosphatase: 58  (06/01/2010)   Total bilirubin: 0.6  (06/01/2010)  Hypertension   Last Blood Pressure: 122 / 76  (06/08/2010)   Serum creatinine: 1.5  (06/01/2010)   Serum potassium 3.9  (06/01/2010)  Self-Management Support :    Diabetes self-management support: Not documented    Hypertension self-management support: Not documented    Lipid  self-management support: Not documented

## 2010-06-14 NOTE — Miscellaneous (Signed)
  Clinical Lists Changes  Observations: Added new observation of PAST SURG HX: Insertion of a new rate sensing pacing lead with  removal of a previous implanted ICD and insertion of device back in the pocket with defibrillation threshold testing.   Partial colectomy 1995 CABG 1996 AAA repair and graft 2000 Aorto-bifemoral bypass 1999 by [name] Early implantable cardio defibrillator August 28, 2004 by [name] stents placed to the saphenous vein graft and also atrial fibrillation. radiofrequency ablation in 2006. Nasal surgery  (06/07/2010 13:41) Added new observation of PRIMARY MD: Michell Heinrich M.D. (06/07/2010 13:41)       Past History:  Past Surgical History: Insertion of a new rate sensing pacing lead with  removal of a previous implanted ICD and insertion of device back in the pocket with defibrillation threshold testing.   Partial colectomy 1995 CABG 1996 AAA repair and graft 2000 Aorto-bifemoral bypass 1999 by [name] Early implantable cardio defibrillator August 28, 2004 by [name] stents placed to the saphenous vein graft and also atrial fibrillation. radiofrequency ablation in 2006. Nasal surgery   Allergies: 1)  ! Morphine 2)  ! Lipitor 3)  ! * Ambien 4)  ! Prednisone

## 2010-06-14 NOTE — Assessment & Plan Note (Signed)
Summary: nail removal and b-12 shot/vfw   Vital Signs:  Patient profile:   75 year old male Height:      70.5 inches Weight:      166.50 pounds BMI:     23.64 O2 Sat:      97 % on Room air Temp:     97.5 degrees F oral Pulse rate:   55 / minute BP sitting:   92 / 50  (right arm) Cuff size:   regular  Vitals Entered By: Francee Piccolo CMA Duncan Dull) (June 01, 2010 10:05 AM)  O2 Flow:  Room air CC: nail removal, B12 inj, feels lousy, wt loss, loss of appetite, calf pain Is Patient Diabetic? Yes   History of Present Illness: 75 y/o WM here for fingernail removal, but says the fingernail is not actually bothering him at all--it is just a bit loose still and discolored/brittle.        He does report a problem lately: has had an acute exacerbation of COPD over the last few weeks and from a respiratory standpoint has improved, but for at least the last 10-12 days he has had no appetite, has felt diffusely weak/washed out--" I feel lousy".  He took a 7d course of doxy that he finished 05/25/10.  No systemic steroids.   Denies dyspnea, hemoptysis, melena, hematuria, or chest pain.  No abdominal or back pain or joint pains.  No rash, no dysuria.  No palpitations, nausea, or dizziness.      Also, he complains of worsening of his hoarsness in the last few weeks.  Says the hoarseness has been present "a year or two" and he was evaluated by ENT at the onset of the problem and says all was okay.   No recent med changes.  Glucose this am 113 fasting.  Current Medications (verified): 1)  Symbicort 160-4.5 Mcg/act  Aero (Budesonide-Formoterol Fumarate) .... 2 Puffs Two Times A Day 2)  Spiriva Handihaler 18 Mcg  Caps (Tiotropium Bromide Monohydrate) .... Use One Inhalation Daily. 3)  Nasonex 50 Mcg/act  Susp (Mometasone Furoate) .... Two Puffs Each Nostril Daily 4)  Zyrtec Allergy 10 Mg Tabs (Cetirizine Hcl) .... Take 1 Tablet By Mouth Once A Day 5)  Adult Aspirin Ec Low Strength 81 Mg  Tbec  (Aspirin) .... One Tablet Daily. 6)  Coreg 12.5 Mg  Tabs (Carvedilol) .... Take 1/2 Tablet Two Times A Day 7)  Nitro-Dur 0.4 Mg/hr  Pt24 (Nitroglycerin) .... Place Under Tongue As Needed For Chest Pain. 8)  Lasix 40 Mg  Tabs (Furosemide) .... Take 1tablet By Mouth Two Times A Day 9)  Januvia 100 Mg  Tabs (Sitagliptin Phosphate) .... Take One Tablet Daily. 10)  Lithium Carbonate 300 Mg  Caps (Lithium Carbonate) .... Take One Capsule Two Times A Day 11)  Finasteride 5 Mg Tabs (Finasteride) .Marland Kitchen.. 1 By Mouth Daily 12)  Warfarin Sodium 5 Mg Tabs (Warfarin Sodium) .... Take As Directed Per Coumadin Clinic 13)  Amiodarone Hcl 200 Mg Tabs (Amiodarone Hcl) .... Take 1 Tablet By Mouth Once A Day 14)  Potassium Chloride Cr 10 Meq Cr-Caps (Potassium Chloride) .... Take One Tablet By Mouth Daily 15)  Guaifenesin Dac 30-10-100 Mg/38ml Soln (Pseudoephedrine-Codeine-Gg) .... As Needed 16)  Acetaminophen 325 Mg  Tabs (Acetaminophen) .... As Needed 17)  Robitussin Dm 100-10 Mg/4ml Syrp (Dextromethorphan-Guaifenesin) .... As Needed For Cough 18)  Synthroid 125 Mcg Tabs (Levothyroxine Sodium) .... Take 1 Tablet By Mouth Once A Day 19)  Flomax 0.4 Mg Caps (Tamsulosin  Hcl) .... Take 1 Tablet By Mouth Once A Day 20)  Xopenex Hfa 45 Mcg/act Aero (Levalbuterol Tartrate) .... Inhale 2 Puffs Four Times A Day As Needed 21)  Lotrisone 1-0.05 % Crea (Clotrimazole-Betamethasone) .... 2 X A Day 22)  Topicort 0.25 % Crea (Desoximetasone) .... As Needed 23)  Benicar 20 Mg Tabs (Olmesartan Medoxomil) .... Take One-Half Tablet By Mouth Daily 24)  Isosorbide Mononitrate Cr 30 Mg Xr24h-Tab (Isosorbide Mononitrate) .... Take One Tablet By Mouth Once Daily 25)  Cyanocobalamin 1000 Mcg/ml Soln (Cyanocobalamin) .... Once Monthly 26)  Hydromet 5-1.5 Mg/74ml Syrp (Hydrocodone-Homatropine) .... 1/2-1 Tsp Every 6 Hr As Needed Cough  Allergies (verified): 1)  ! Morphine 2)  ! Lipitor 3)  ! * Ambien 4)  ! Prednisone  Past  History:  Past Medical History: Last updated: 10/02/2009 CAD (ICD-414.00)- post coronary artery bypass graft surgery with prior percutaneos intervention of the saphenous vein graft with known saphenous vein graft disease. Abdominal aortic aneurysm      - 5.8 cm abd u/s Nov 2010 Descending thoracic aorta aneurysm Bilateral popliteal artery aneurysm CAD s/p MI Ischemic cardiomyopathy with EF 40% Paroxysmal atrial fibrillation HTN Peripheral vascular disease Dyslipidemia Diabetes mellitus GERD Hiatal hernia Diverticular disease BPH COPD      - 05/09/08 PFT FEV1 1.84 (55%), FVC 4.42 (95%), TLC 6.46 (92%), DLCO 72%, no BD response Pneumonia January, 2009 Allergic rhinitis Shingles OSA      - PSG 06/14/09 RDI 17, PLMI 96      - Intolerant of CPAP or BPAP  Past Surgical History: Last updated: 02/15/2009 Insertion of a new rate sensing pacing lead with  removal of a previous implanted ICD and insertion of device back in the pocket with defibrillation threshold testing.   Partial colectomy 1995 CABG 1996 Aorto-bifemoral bypass 1999 by [name] Early implantable cardio defibrillator August 28, 2004 by [name] stents placed to the saphenous vein graft and also atrial fibrillation. radiofrequency ablation in 2006. Nasal surgery  Review of Systems       see HPI  Physical Exam  General:  VS: noted, all normal except bp a little low at 92/50. Gen: Alert, NAD, oriented x 4. No pallor or jaundice. HEENT: Scalp without lesions or hair loss.  Ears: EACs clear, normal epithelium.  TMs with good light reflex and landmarks bilaterally.  Eyes: no injection, icteris, swelling, or exudate.  EOMI, PERRLA. Nose: no drainage or turbinate edema/swelling.  No injection or focal lesion.  Mouth: lips without lesion/swelling.  Oral mucosa pink and moist.  Dentition intact and without obvious caries or gingival swelling.  Oropharynx without erythema, exudate, or swelling.  Neck: supple.  No  lymphadenopathy, thyromegaly, or mass. Chest: symmetric expansion, with nonlabored respirations.  Clear and equal breath sounds in all lung fields.   CV: RRR.  Peripheral pulses 2+/symmetric. ABD: soft, mild epigastric TTP with deep palpation but no guarding or rebound.  No bruit, no mass, no HSM.  BS normal.  EXT: no c/c/e   Impression & Recommendations:  Problem # 1:  FATIGUE, ACUTE (ICD-780.79) Assessment New Possibly just post-viral syndrome, but with Hb slightly low at recent check earlier this month, will further eval with the following blood work/hemoccult cards.  F/u 5-7d.  I asked him to hold his benicar and his 2nd dose of lasix until he feels back to normal for a couple of days.  Orders: TLB-BMP (Basic Metabolic Panel-BMET) (80048-METABOL) TLB-CBC Platelet - w/Differential (85025-CBCD) TLB-Hepatic/Liver Function Pnl (80076-HEPATIC) TLB-TSH (Thyroid Stimulating Hormone) (  73-TSH) [facility] Hemoccult Cards #6 (take home) (Hem Cards #6) T-Lithium Level ([address]-60454)  Problem # 2:  ONYCHOMYCOSIS (ICD-110.1) Assessment: Improved  With brittle fingernail but NO pain/discomfort---will simply observe: no intervention for now.  The following medications were removed from the medication list:    Grifulvin V 500 Mg Tabs (Griseofulvin microsize) .Marland Kitchen... 1 tab by mouth qd His updated medication list for this problem includes:    Lotrisone 1-0.05 % Crea (Clotrimazole-betamethasone) .Marland Kitchen... 2 x a day  Problem # 3:  PERNICIOUS ANEMIA (ICD-281.0) Assessment: Unchanged B12 shot today.  Labs today to make sure there's no component of iron deficiency/blood loss anemia. His updated medication list for this problem includes:    Cyanocobalamin 1000 Mcg/ml Soln (Cyanocobalamin) ..... Once monthly  Orders: TLB-BMP (Basic Metabolic Panel-BMET) (80048-METABOL) TLB-CBC Platelet - w/Differential (85025-CBCD) TLB-Hepatic/Liver Function Pnl (80076-HEPATIC) TLB-TSH (Thyroid Stimulating Hormone)  (84443-TSH) [facility] Hemoccult Cards #6 (take home) (Hem Cards #6) Vit B12 1000 mcg (U9811)  Problem # 4:  UNSPECIFIED ANEMIA (ICD-285.9) Assessment: New  F/u Hb and iron testing today.  Hemoccults today.  PT/INR today.    Orders: TLB-IBC Pnl (Iron/FE;Transferrin) (83550-IBC) TLB-Ferritin (82728-FER)  His updated medication list for this problem includes:    Cyanocobalamin 1000 Mcg/ml Soln (Cyanocobalamin) ..... Once monthly  Problem # 5:  HOARSENESS (BJY-782.95) Assessment: Deteriorated Chronic.  Will review ENT eval that has been done and am currently considering getting him back for recheck with them.  Orders: TLB-BMP (Basic Metabolic Panel-BMET) (80048-METABOL) TLB-CBC Platelet - w/Differential (85025-CBCD) TLB-Hepatic/Liver Function Pnl (80076-HEPATIC) TLB-TSH (Thyroid Stimulating Hormone) (84443-TSH) [place] [facility] Hemoccult Cards #6 (take home) (Hem Cards #6)  Problem # 6:  COPD (ICD-496) Assessment: Improved  Exacerbation nearly completely resolved.  His updated medication list for this problem includes:    Symbicort 160-4.5 Mcg/act Aero (Budesonide-formoterol fumarate) .Marland Kitchen... 2 puffs two times a day    Spiriva Handihaler 18 Mcg Caps (Tiotropium bromide monohydrate) ..... Use one inhalation daily.    Xopenex Hfa 45 Mcg/act Aero (Levalbuterol tartrate) ..... Inhale 2 puffs four times a day as needed  Orders: TLB-BMP (Basic Metabolic Panel-BMET) (80048-METABOL) TLB-CBC Platelet - w/Differential (85025-CBCD) TLB-Hepatic/Liver Function Pnl (80076-HEPATIC) TLB-TSH (Thyroid Stimulating Hormone) (84443-TSH)  GI Hemoccult Cards #6 (take home) (Hem Cards #6)  His updated medication list for this problem includes:    Symbicort 160-4.5 Mcg/act Aero (Budesonide-formoterol fumarate) .Marland Kitchen... 2 puffs two times a day    Spiriva Handihaler 18 Mcg Caps (Tiotropium bromide monohydrate) ..... Use one inhalation daily.    Xopenex Hfa 45 Mcg/act Aero (Levalbuterol tartrate) .....  Inhale 2 puffs four times a day as needed  Complete Medication List: 1)  Symbicort 160-4.5 Mcg/act Aero (Budesonide-formoterol fumarate) .... 2 puffs two times a day 2)  Spiriva Handihaler 18 Mcg Caps (Tiotropium bromide monohydrate) .... Use one inhalation daily. 3)  Nasonex 50 Mcg/act Susp (Mometasone furoate) .... Two puffs each nostril daily 4)  Zyrtec Allergy 10 Mg Tabs (Cetirizine hcl) .... Take 1 tablet by mouth once a day 5)  Adult Aspirin Ec Low Strength 81 Mg Tbec (Aspirin) .... One tablet daily. 6)  Coreg 12.5 Mg Tabs (Carvedilol) .... Take 1/2 tablet two times a day 7)  Nitro-dur 0.4 Mg/hr Pt24 (Nitroglycerin) .... Place under tongue as needed for chest pain. 8)  Lasix 40 Mg Tabs (Furosemide) .... Take 1tablet by mouth two times a day 9)  Januvia 100 Mg Tabs (Sitagliptin phosphate) .... Take one tablet daily. 10)  Lithium Carbonate 300 Mg Caps (Lithium carbonate) .... Take  one capsule two times a day 11)  Finasteride 5 Mg Tabs (Finasteride) .Marland Kitchen.. 1 by mouth daily 12)  Warfarin Sodium 5 Mg Tabs (Warfarin sodium) .... Take as directed per coumadin clinic 13)  Amiodarone Hcl 200 Mg Tabs (Amiodarone hcl) .... Take 1 tablet by mouth once a day 14)  Potassium Chloride Cr 10 Meq Cr-caps (Potassium chloride) .... Take one tablet by mouth daily 15)  Guaifenesin Dac 30-10-100 Mg/33ml Soln (Pseudoephedrine-codeine-gg) .... As needed 16)  Acetaminophen 325 Mg Tabs (Acetaminophen) .... As needed 17)  Robitussin Dm 100-10 Mg/34ml Syrp (Dextromethorphan-guaifenesin) .... As needed for cough 18)  Synthroid 125 Mcg Tabs (Levothyroxine sodium) .... Take 1 tablet by mouth once a day 19)  Flomax 0.4 Mg Caps (Tamsulosin hcl) .... Take 1 tablet by mouth once a day 20)  Xopenex Hfa 45 Mcg/act Aero (Levalbuterol tartrate) .... Inhale 2 puffs four times a day as needed 21)  Lotrisone 1-0.05 % Crea (Clotrimazole-betamethasone) .... 2 x a day 22)  Topicort 0.25 % Crea (Desoximetasone) .... As needed 23)   Benicar 20 Mg Tabs (Olmesartan medoxomil) .... Take one-half tablet by mouth daily 24)  Isosorbide Mononitrate Cr 30 Mg Xr24h-tab (Isosorbide mononitrate) .... Take one tablet by mouth once daily 25)  Cyanocobalamin 1000 Mcg/ml Soln (Cyanocobalamin) .... Once monthly 26)  Hydromet 5-1.5 Mg/70ml Syrp (Hydrocodone-homatropine) .... 1/2-1 tsp every 6 hr as needed cough  Other Orders: TLB-PT (Protime) (85610-PTP)  Patient Instructions: 1)  Hold your Benicar and your 2nd dose of lasix for now.   You can restart them when you are feeling back to normal for a couple of days. 2)  Recheck in 5-7d.   Medication Administration  Injection # 1:    Medication: Vit B12 1000 mcg    Diagnosis: PERNICIOUS ANEMIA (ICD-281.0)    Route: IM    Site: R deltoid    Exp Date: 02/2012    Lot #: [id number]    Mfr: [employer]    Patient tolerated injection without complications    Given by: Francee Piccolo CMA (AAMA) (June 01, 2010 11:19 AM)  Orders Added: 1)  TLB-BMP (Basic Metabolic Panel-BMET) [80048-METABOL] 2)  TLB-CBC Platelet - w/Differential [85025-CBCD] 3)  TLB-Hepatic/Liver Function Pnl [80076-HEPATIC] 4)  TLB-TSH (Thyroid Stimulating Hormone) [84443-TSH] 5)  [facility] Hemoccult Cards #6 (take home) [Hem Cards #6] 6)  T-Lithium Level [80178-23440] 7)  Est. Patient Level IV [30865] 8)  TLB-PT (Protime) [85610-PTP] 9)  TLB-IBC Pnl (Iron/FE;Transferrin) [83550-IBC] 10)  TLB-Ferritin [82728-FER] 11)  Vit B12 1000 mcg [J3420]

## 2010-06-14 NOTE — Progress Notes (Signed)
  Walk in Patient Form Recieved "Someone called him today about a pacer appt" forwarded to pacer clinic [facility]  June 29, 2009 3:12 PM

## 2010-06-14 NOTE — Progress Notes (Signed)
Summary: returned call  Phone Note Call from Patient   Caller: Patient [phone] or [phone] Reason for Call: Talk to Nurse Summary of Call: pt returned call to lauren-pls call  Initial call taken by: Glynda Jaeger,  March 28, 2010 10:50 AM  Follow-up for Phone Call        adv pt of Dr. Rosalyn Charters comments regarding labs. Pt had no questions.  Follow-up by: Claris Gladden RN,  March 28, 2010 11:14 AM

## 2010-06-14 NOTE — Assessment & Plan Note (Signed)
Summary: 3 MONTHS   Copy to:  [name], [name] Primary Provider/Referring Provider:  Corrin Parker  CC:  COPD. OSA. The patient c/o increased SOB with exertion and at rest. Pt had myocardial infarction in June 2011.  The patient was changed to Proair due to insurance coverage but says it does not work as well as the [employer].Marland Kitchen  History of Present Illness: 75 yo with known history of COPD, hoarseness, pulmonary nodule and OSA.  He was in the hospital in June for a NSTEMI.  He was found to have an EF of 20%.  Since then he has noticed more problem with his breathing.  He has noticed that his breathing has gotten worse since his dose of lasix was decreased.  He has also gained some weight since his lasix dose was changed.  He feels like he can't take a full breath.  He does not have much cough.  He will get an occasional wheeze, but feels this is coming more from his throat.  He is not bringing up sputum, and denies fever or hemoptysis.  He was using xopenex, and felt this helped.  His insurance company would no longer cover the expense for xopenex.  As a result he was changed to proair, but does not feel that this helps.    Current Medications (verified): 1)  Symbicort 160-4.5 Mcg/act  Aero (Budesonide-Formoterol Fumarate) .... 2 Puffs Two Times A Day 2)  Spiriva Handihaler 18 Mcg  Caps (Tiotropium Bromide Monohydrate) .... Use One Inhalation Daily. 3)  Nasonex 50 Mcg/act  Susp (Mometasone Furoate) .... Two Puffs Each Nostril Daily 4)  Zyrtec Allergy 10 Mg Tabs (Cetirizine Hcl) .... Take 1 Tablet By Mouth Once A Day 5)  Adult Aspirin Ec Low Strength 81 Mg  Tbec (Aspirin) .... One Tablet Daily. 6)  Coreg 12.5 Mg  Tabs (Carvedilol) .... Take 1/2 Tablet Two Times A Day 7)  Nitro-Dur 0.4 Mg/hr  Pt24 (Nitroglycerin) .... Place Under Tongue As Needed For Chest Pain. 8)  Lasix 40 Mg  Tabs (Furosemide) .... Take 1 Tablet By Mouth Once A Day 9)  Januvia 100 Mg  Tabs (Sitagliptin  Phosphate) .... Take One Tablet Daily. 10)  Lithium Carbonate 300 Mg  Caps (Lithium Carbonate) .... Take One Capsule Two Times A Day 11)  Finasteride 5 Mg Tabs (Finasteride) .Marland Kitchen.. 1 By Mouth Daily 12)  Warfarin Sodium 5 Mg Tabs (Warfarin Sodium) .... Take As Directed Per Coumadin Clinic 13)  Amiodarone Hcl 200 Mg Tabs (Amiodarone Hcl) .... Take 1 Tablet By Mouth Once A Day 14)  Potassium Chloride Cr 10 Meq Cr-Caps (Potassium Chloride) .... Take One Tablet By Mouth Daily 15)  Guaifenesin Dac 30-10-100 Mg/59ml Soln (Pseudoephedrine-Codeine-Gg) .... As Needed 16)  Acetaminophen 325 Mg  Tabs (Acetaminophen) .... As Needed 17)  Robitussin Dm 100-10 Mg/2ml Syrp (Dextromethorphan-Guaifenesin) .... As Needed For Cough 18)  Synthroid 125 Mcg Tabs (Levothyroxine Sodium) .... Take 1 Tablet By Mouth Once A Day 19)  Flomax 0.4 Mg Caps (Tamsulosin Hcl) .... Take 1 Tablet By Mouth Once A Day 20)  Lotrisone 1-0.05 % Crea (Clotrimazole-Betamethasone) .... Two Times A Day 21)  Topicort 0.25 % Crea (Desoximetasone) .... Two Times A Day 22)  Proair Hfa 108 (90 Base) Mcg/act Aers (Albuterol Sulfate) .... 2 Puffs Up To Four Times Daily As Needed For Shortness of Breath  Allergies (verified): 1)  ! Morphine 2)  ! Lipitor 3)  ! * Ambien 4)  ! Prednisone  Past History:  Past  Medical History: Last updated: 10/02/2009 CAD (ICD-414.00)- post coronary artery bypass graft surgery with prior percutaneos intervention of the saphenous vein graft with known saphenous vein graft disease. Abdominal aortic aneurysm      - 5.8 cm abd u/s Nov 2010 Descending thoracic aorta aneurysm Bilateral popliteal artery aneurysm CAD s/p MI Ischemic cardiomyopathy with EF 40% Paroxysmal atrial fibrillation HTN Peripheral vascular disease Dyslipidemia Diabetes mellitus GERD Hiatal hernia Diverticular disease BPH COPD      - 05/09/08 PFT FEV1 1.84 (55%), FVC 4.42 (95%), TLC 6.46 (92%), DLCO 72%, no BD response Pneumonia  January, 2009 Allergic rhinitis Shingles OSA      - PSG 06/14/09 RDI 17, PLMI 96      - Intolerant of CPAP or BPAP  Past Surgical History: Last updated: 02/15/2009 Insertion of a new rate sensing pacing lead with  removal of a previous implanted ICD and insertion of device back in the pocket with defibrillation threshold testing.   Partial colectomy 1995 CABG 1996 Aorto-bifemoral bypass 1999 by [name] Early implantable cardio defibrillator August 28, 2004 by [name] stents placed to the saphenous vein graft and also atrial fibrillation. radiofrequency ablation in 2006. Nasal surgery  CXR  Procedure date:  11/09/2009  Findings:       CHEST - 2 VIEW    Comparison: 11/01/2009    Findings: Left pacer remains in place, unchanged. There is   hyperinflation of the lungs compatible with COPD.  Prior CABG   scarring in the left base.  No effusions.    IMPRESSION:   COPD/chronic changes.  No acute findings.   Vital Signs:  Patient profile:   75 year old male Height:      71 inches (180.34 cm) Weight:      175.38 pounds (79.72 kg) BMI:     24.55 O2 Sat:      97 % on Room air Temp:     97.5 degrees F (36.39 degrees C) oral Pulse rate:   67 / minute BP sitting:   120 / 78  (left arm) Cuff size:   regular  Vitals Entered By: Michel Bickers CMA (January 01, 2010 3:08 PM)  O2 Sat at Rest %:  97 O2 Flow:  Room air CC: COPD. OSA. The patient c/o increased SOB with exertion and at rest. Pt had myocardial infarction in June 2011.  The patient was changed to Proair due to insurance coverage but says it does not work as well as the [employer]. Comments Medications reviewed with the patient. Daytime phone verified. Michel Bickers [facility]  January 01, 2010 3:09 PM   Physical Exam  General:  normal appearance.   Nose:  clear nasal discharge.   Mouth:  no deformity or lesions Neck:  no JVD.   Lungs:  decreased breath sounds, no wheezing or rales Heart:  regular rate and rhythm,  S1, S2 without murmurs, rubs, gallops, or clicks Extremities:  No edema Cervical Nodes:  no significant adenopathy   Impression & Recommendations:  Problem # 1:  COPD (ICD-496) His COPD appears stable.  He had to switch from xopenex (which he felt benefit from) to proair due to [employer].  He does not feel like proair helps.  Will see if he does better with proventil.  Problem # 2:  PULMONARY NODULE (ICD-518.89)   f/u CT chest to October 2011.  Problem # 3:  DYSPNEA (ICD-786.05) Likely from a combination of his COPD and cardiac disease.  His most recent Echo from  June 2011 showed EF of 20% and grade 1 diastolic dysfunction.  He did notice worsening dyspnea after his lasix was decreased.  I have advised him to d/w primary care and cardiology about whether he should increase his lasix.  Medications Added to Medication List This Visit: 1)  Proair Hfa 108 (90 Base) Mcg/act Aers (Albuterol sulfate) .... 2 puffs up to four times daily as needed for shortness of breath 2)  Proventil Hfa 108 (90 Base) Mcg/act Aers (Albuterol sulfate) .... Two puffs up to four times per day as needed  Complete Medication List: 1)  Symbicort 160-4.5 Mcg/act Aero (Budesonide-formoterol fumarate) .... 2 puffs two times a day 2)  Spiriva Handihaler 18 Mcg Caps (Tiotropium bromide monohydrate) .... Use one inhalation daily. 3)  Nasonex 50 Mcg/act Susp (Mometasone furoate) .... Two puffs each nostril daily 4)  Zyrtec Allergy 10 Mg Tabs (Cetirizine hcl) .... Take 1 tablet by mouth once a day 5)  Adult Aspirin Ec Low Strength 81 Mg Tbec (Aspirin) .... One tablet daily. 6)  Coreg 12.5 Mg Tabs (Carvedilol) .... Take 1/2 tablet two times a day 7)  Nitro-dur 0.4 Mg/hr Pt24 (Nitroglycerin) .... Place under tongue as needed for chest pain. 8)  Lasix 40 Mg Tabs (Furosemide) .... Take 1 tablet by mouth once a day 9)  Januvia 100 Mg Tabs (Sitagliptin phosphate) .... Take one tablet daily. 10)  Lithium Carbonate 300 Mg  Caps (Lithium carbonate) .... Take one capsule two times a day 11)  Finasteride 5 Mg Tabs (Finasteride) .Marland Kitchen.. 1 by mouth daily 12)  Warfarin Sodium 5 Mg Tabs (Warfarin sodium) .... Take as directed per coumadin clinic 13)  Amiodarone Hcl 200 Mg Tabs (Amiodarone hcl) .... Take 1 tablet by mouth once a day 14)  Potassium Chloride Cr 10 Meq Cr-caps (Potassium chloride) .... Take one tablet by mouth daily 15)  Guaifenesin Dac 30-10-100 Mg/13ml Soln (Pseudoephedrine-codeine-gg) .... As needed 16)  Acetaminophen 325 Mg Tabs (Acetaminophen) .... As needed 17)  Robitussin Dm 100-10 Mg/28ml Syrp (Dextromethorphan-guaifenesin) .... As needed for cough 18)  Synthroid 125 Mcg Tabs (Levothyroxine sodium) .... Take 1 tablet by mouth once a day 19)  Flomax 0.4 Mg Caps (Tamsulosin hcl) .... Take 1 tablet by mouth once a day 20)  Lotrisone 1-0.05 % Crea (Clotrimazole-betamethasone) .... Two times a day 21)  Topicort 0.25 % Crea (Desoximetasone) .... Two times a day 22)  Proventil Hfa 108 (90 Base) Mcg/act Aers (Albuterol sulfate) .... Two puffs up to four times per day as needed  Other Orders: Est. Patient Level III (16109)  Patient Instructions: 1)  Stop proair 2)  Proventil two puffs up to four times per day as needed 3)  Follow up in 2 months Prescriptions: PROVENTIL HFA 108 (90 BASE) MCG/ACT AERS (ALBUTEROL SULFATE) two puffs up to four times per day as needed  #1 x 6   Entered and Authorized by:   Coralyn Helling MD   Signed by:   Coralyn Helling MD on 01/01/2010   Method used:   Electronically to        [facility]  [address] [phone]* (retail)       [address] [place], [place]  [address]       Ph: [phone] or [phone]       Fax: [phone]   RxID:   [id number]

## 2010-06-14 NOTE — Cardiovascular Report (Signed)
Summary: Health Management Program Status Report  Health Management Program Status Report   Imported By: Roderic Ovens 07/07/2009 15:19:41  _____________________________________________________________________  External Attachment:    Type:   Image     Comment:   External Document

## 2010-06-14 NOTE — Assessment & Plan Note (Signed)
Summary: f22m   Visit Type:  1 month follow up Referring Provider:  [name], [name] Primary Provider:  Corrin Parker  CC:  Sob.  History of Present Illness: Still not as strong as he was.  But he has been doing reasonably well.  He is gaining a little bit of ground.  No chest pain.       Current Medications (verified): 1)  Symbicort 160-4.5 Mcg/act  Aero (Budesonide-Formoterol Fumarate) .... 2 Puffs Two Times A Day 2)  Spiriva Handihaler 18 Mcg  Caps (Tiotropium Bromide Monohydrate) .... Use One Inhalation Daily. 3)  Nasonex 50 Mcg/act  Susp (Mometasone Furoate) .... Two Puffs Each Nostril Daily 4)  Zyrtec Allergy 10 Mg Tabs (Cetirizine Hcl) .... Take 1 Tablet By Mouth Once A Day 5)  Adult Aspirin Ec Low Strength 81 Mg  Tbec (Aspirin) .... One Tablet Daily. 6)  Coreg 12.5 Mg  Tabs (Carvedilol) .... Take 1/2 Tablet Two Times A Day 7)  Nitro-Dur 0.4 Mg/hr  Pt24 (Nitroglycerin) .... Place Under Tongue As Needed For Chest Pain. 8)  Lasix 40 Mg  Tabs (Furosemide) .... Take 1 Tablet By Mouth Once A Day 9)  Januvia 100 Mg  Tabs (Sitagliptin Phosphate) .... Take One Tablet Daily. 10)  Lithium Carbonate 300 Mg  Caps (Lithium Carbonate) .... Take One Capsule Two Times A Day 11)  Finasteride 5 Mg Tabs (Finasteride) .Marland Kitchen.. 1 By Mouth Daily 12)  Warfarin Sodium 5 Mg Tabs (Warfarin Sodium) .... Take As Directed Per [facility]madin Clinic 13)  Amiodarone Hcl 200 Mg Tabs (Amiodarone Hcl) .... Take 1 Tablet By Mouth Once A Day 14)  Potassium Chloride Cr 10 Meq Cr-Caps (Potassium Chloride) .... Take One Tablet By Mouth Daily 15)  Guaifenesin Dac 30-10-100 Mg/44ml Soln (Pseudoephedrine-Codeine-Gg) .... As Needed 16)  Acetaminophen 325 Mg  Tabs (Acetaminophen) .... As Needed 17)  Robitussin Dm 100-10 Mg/52ml Syrp (Dextromethorphan-Guaifenesin) .... As Needed For Cough 18)  Synthroid 125 Mcg Tabs (Levothyroxine Sodium) .... Take 1 Tablet By Mouth Once A Day 19)  Flomax 0.4 Mg Caps (Tamsulosin Hcl) ....  Take 1 Tablet By Mouth Once A Day 20)  Lotrisone 1-0.05 % Crea (Clotrimazole-Betamethasone) .... Two Times A Day 21)  Topicort 0.25 % Crea (Desoximetasone) .... Two Times A Day  Allergies: 1)  ! Morphine 2)  ! Lipitor 3)  ! * Ambien 4)  ! Prednisone  Vital Signs:  Patient profile:   75 year old male Height:      71 inches Weight:      176 pounds BMI:     24.64 Pulse rate:   62 / minute Pulse rhythm:   regular Resp:     18 per minute BP sitting:   104 / 68  (left arm) Cuff size:   large  Vitals Entered By: Vikki Ports (December 29, 2009 3:23 PM)  Physical Exam  General:  Well developed, well nourished, in no acute distress. Head:  normocephalic and atraumatic Eyes:  PERRLA/EOM intact; conjunctiva and lids normal. Lungs:  Minimal ronchii Heart:  Regular rhythm.  Minimal apical murmur. Abdomen:  Bowel sounds positive; abdomen soft and non-tender without masses, organomegaly, or hernias noted. No hepatosplenomegaly. Extremities:  No clubbing or cyanosis.  Mild erythematous streaks on feet.  Neurologic:  Alert and oriented x 3.   EKG  Procedure date:  12/29/2009  Findings:      NSR. LAD.Marland Kitchen  iLBBB.  ST and T wave abnormality.    Cardiac Cath  Procedure date:  11/07/2009  Findings:      CONCLUSIONS: 1. Coronary artery disease status post coronary bypass graft surgery     in 1996. 2. Severe native vessel disease with 40% narrowing in the proximal     left anterior descending, 70% narrowing in the proximal circumflex     artery with multiple 90% stenoses in the second marginal branch,     total occlusion of the first marginal branch, and 95% stenosis in     the posterolateral branch, and total occlusion of the right     coronary artery. 3. Occluded vein graft to the circumflex artery which is new since the     last study, occlusion of the vein graft to the right coronary which     is old, patent vein graft to diagonal branch of the left anterior     descending with  40% distal stenosis, and a patent left internal     mammary artery graft to the left anterior descending. 4. Severe left ventricular function by noninvasive studies.   RECOMMENDATIONS:  Since the last study, the patient has occluded vein graft to the circumflex artery which probably represents the culprit lesion.  This does not appear to be favorable for percutaneous intervention.  We will plan continued medical therapy.         Bruce Elvera Lennox Juanda Chance, MD, [facility]         ICD Specifications Following MD:  Lewayne Bunting, MD     Referring MD:  [facility] ICD Vendor:  [employer]     ICD Model Number:  [phone]     ICD Serial Number:  [id number] ICD DOI:  08/28/2004     ICD Implanting MD:  Lewayne Bunting, MD  Lead 1:    Location: RV     DOI: 08/28/2004     Model #: [id number]     Serial #: [id number]     Status: active Lead 2:    Location: RV     DOI: 01/17/2009     Model #: [id number]     Serial #: [id number]     Status: active  Indications::  ICM   ICD Follow Up ICD Dependent:  No      Episodes Coumadin:  Yes  Brady Parameters Mode VVI     Lower Rate Limit:  55      Tachy Zones VF:  200     VT:  250 FVT VIA VF     VT1:  182     Impression & Recommendations:  Problem # 1:  CAD, ARTERY BYPASS GRAFT (ICD-414.04)  No recurrent angina at present.    Doing reasonably well. See cath report of Dr.Brodie.  No recurrent angina.  Occlusion of SVG to distal OM as culprit.   His updated medication list for this problem includes:    Adult Aspirin Ec Low Strength 81 Mg Tbec (Aspirin) ..... One tablet daily.    Coreg 12.5 Mg Tabs (Carvedilol) .Marland Kitchen... Take 1/2 tablet two times a day    Nitro-dur 0.4 Mg/hr Pt24 (Nitroglycerin) .Marland Kitchen... Place under tongue as needed for chest pain.    Warfarin Sodium 5 Mg Tabs (Warfarin sodium) .Marland Kitchen... 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  Is followed by  Dr. Arbie Cookey.  Not a good surgical candidate.    Problem # 3:  ISCHEMIC CARDIOMYOPATHY (ICD-414.8)  Dr. Dagoberto Ligas cut his furosemide, and the weight has increased.  Will resume.   His updated medication list for this problem includes:    Adult Aspirin Ec Low Strength 81 Mg Tbec (Aspirin) ..... One tablet daily.    Coreg 12.5 Mg Tabs (Carvedilol) .Marland Kitchen... Take 1/2 tablet two times a day    Nitro-dur 0.4 Mg/hr Pt24 (Nitroglycerin) .Marland Kitchen... Place under tongue as needed for chest pain.    Lasix 40 Mg Tabs (Furosemide) .Marland Kitchen... Take 1 tablet by mouth once a day    Warfarin Sodium 5 Mg Tabs (Warfarin sodium) .Marland Kitchen... Take as directed per coumadin clinic    Amiodarone Hcl 200 Mg Tabs (Amiodarone hcl) .Marland Kitchen... Take 1 tablet by mouth once a day  Orders: EKG w/ Interpretation (93000) T-Basic Metabolic Panel ([id number])  Problem # 4:  HYPERTENSION (ICD-401.9)  His updated medication list for this problem includes:    Adult Aspirin Ec Low Strength 81 Mg Tbec (Aspirin) ..... One tablet daily.    Coreg 12.5 Mg Tabs (Carvedilol) .Marland Kitchen... Take 1/2 tablet two times a day    Lasix 40 Mg Tabs (Furosemide) .Marland Kitchen... Take 1 tablet by mouth once a day  Patient Instructions: 1)  Your physician recommends that you schedule a follow-up appointment in: 3 MONTHS 2)  Your physician recommends that you have  lab work today: BMP 3)  Your physician recommends that you continue on your current medications as directed. Please refer to the Current Medication list given to you today.

## 2010-06-14 NOTE — Progress Notes (Signed)
Summary: results of blood work  Phone Note Call from Patient   Caller: Patient Reason for Call: Talk to Nurse, Lab or Test Results Summary of Call: pt wants results of blood work-pls call [phone] Initial call taken by: Glynda Jaeger,  January 01, 2010 1:52 PM  Follow-up for Phone Call        Pt aware of results by phone.  The pt would like to clarify if he should increase his diuretic or not.  I will ask Dr Riley Kill what the pt's current dose of diuretic should be. Julieta Gutting, RN, BSN  January 01, 2010 2:38 PM  I spoke with the pt and made him aware that he can increase his Furosemide to 2 tablets daily per Dr Riley Kill.  The pt should have a BMP rechecked on 01/09/10.   I cannot update the pt's medication list because his OV note is pending. Follow-up by: Julieta Gutting, RN, BSN,  January 01, 2010 7:11 PM

## 2010-06-14 NOTE — Medication Information (Signed)
Summary: Coumadin Clinic  Anticoagulant Therapy  Managed by: Reina Fuse, PharmD Referring MD: [name] MD PCP: Corrin Parker Supervising MD: Jens Som MD, Arlys John Indication 1: Atrial Fibrillation (ICD-427.31) Lab Used: [facility] Site: [facility] INR POC 2.5 INR RANGE 2 - 3  Dietary changes: no    Health status changes: no    Bleeding/hemorrhagic complications: no    Recent/future hospitalizations: no    Any changes in medication regimen? no    Recent/future dental: no  Any missed doses?: yes     Details: Missed dose Monday night.   Is patient compliant with meds? yes       Current Medications (verified): 1)  Symbicort 160-4.5 Mcg/act  Aero (Budesonide-Formoterol Fumarate) .... 2 Puffs Two Times A Day 2)  Spiriva Handihaler 18 Mcg  Caps (Tiotropium Bromide Monohydrate) .... Use One Inhalation Daily. 3)  Nasonex 50 Mcg/act  Susp (Mometasone Furoate) .... Two Puffs Each Nostril Daily 4)  Zyrtec Allergy 10 Mg Tabs (Cetirizine Hcl) .... Take 1 Tablet By Mouth Once A Day 5)  Adult Aspirin Ec Low Strength 81 Mg  Tbec (Aspirin) .... One Tablet Daily. 6)  Coreg 12.5 Mg  Tabs (Carvedilol) .... Take 1/2 Tablet Two Times A Day 7)  Nitro-Dur 0.4 Mg/hr  Pt24 (Nitroglycerin) .... Place Under Tongue As Needed For Chest Pain. 8)  Lasix 40 Mg  Tabs (Furosemide) .... Take 2 Tablet By Mouth Once Daily 9)  Januvia 100 Mg  Tabs (Sitagliptin Phosphate) .... Take One Tablet Daily. 10)  Lithium Carbonate 300 Mg  Caps (Lithium Carbonate) .... Take One Capsule Two Times A Day 11)  Finasteride 5 Mg Tabs (Finasteride) .Marland Kitchen.. 1 By Mouth Daily 12)  Warfarin Sodium 5 Mg Tabs (Warfarin Sodium) .... Take As Directed Per Coumadin Clinic 13)  Amiodarone Hcl 200 Mg Tabs (Amiodarone Hcl) .... Take 1 Tablet By Mouth Once A Day 14)  Potassium Chloride Cr 10 Meq Cr-Caps (Potassium Chloride) .... Take One Tablet By Mouth Daily 15)  Guaifenesin Dac 30-10-100 Mg/25ml Soln (Pseudoephedrine-Codeine-Gg) .... As  Needed 16)  Acetaminophen 325 Mg  Tabs (Acetaminophen) .... As Needed 17)  Robitussin Dm 100-10 Mg/66ml Syrp (Dextromethorphan-Guaifenesin) .... As Needed For Cough 18)  Synthroid 112 Mcg Tabs (Levothyroxine Sodium) .... Take 1 Tablet By Mouth Once A Day 19)  Flomax 0.4 Mg Caps (Tamsulosin Hcl) .... Take 1 Tablet By Mouth Once A Day 20)  Xopenex Hfa 45 Mcg/act Aero (Levalbuterol Tartrate) .... Inhale 2 Puffs Four Times A Day As Needed  Allergies (verified): 1)  ! Morphine 2)  ! Lipitor 3)  ! * Ambien 4)  ! Prednisone  Anticoagulation Management History:      The patient is taking warfarin and comes in today for a routine follow up visit.  Positive risk factors for bleeding include an age of 37 years or older and presence of serious comorbidities.  The bleeding index is 'intermediate risk'.  Positive CHADS2 values include History of CHF, History of HTN, and History of Diabetes.  Negative CHADS2 values include Age > 49 years old.  The start date was 05/29/2007.  His last INR was 8.4 ratio.  Anticoagulation responsible provider: Jens Som MD, Arlys John.  INR POC: 2.5.  Cuvette Lot#: [id number].  Exp: 03/2011.    Anticoagulation Management Assessment/Plan:      The patient's current anticoagulation dose is Warfarin sodium 5 mg tabs: take as directed per coumadin clinic.  The target INR is 2 - 3.  The next INR is due 04/09/2010.  Anticoagulation instructions  were given to patient.  Results were reviewed/authorized by Reina Fuse, PharmD.  He was notified by Reina Fuse PharmD.         Prior Anticoagulation Instructions: INR 2.2 Continue 2.5mg  daily except 5mg s on Tuesdays and Fridays. Recheck in 4 weeks.   Current Anticoagulation Instructions: INR 2.5  Continue taking Coumadin 0.5 tab (2.5 mg) on all days except Coumadin 1 tab (5 mg) on Tuesdays and Fridays. Return to clinic in 4 weeks.

## 2010-06-14 NOTE — Progress Notes (Signed)
Summary: returning call  Phone Note Call from Patient Call back at [phone]   Caller: Patient Call For: sood Summary of Call: patient is retuning call. he dont know what it is regarding, or who it was taht called.  Initial call taken by: Valinda Hoar,  August 03, 2009 12:56 PM  Follow-up for Phone Call        called and spoke with pt.  pt aware of cxr results.  Aundra Millet Reynolds LPN  August 03, 2009 1:41 PM

## 2010-06-14 NOTE — Progress Notes (Signed)
Summary: refill and prescription  Phone Note Call from Patient Call back at [phone]   Caller: Spouse//vickie Call For: sood Reason for Call: Refill Medication Summary of Call: Need rx for xopenex hfa , also needs refill on symbicort 160-55mcg.//[facility] Initial call taken by: Darletta Moll,  January 19, 2010 10:26 AM  Follow-up for Phone Call        Spoke with pt wife and verified rx needs to be sent. refills sent. pt wife aware. Carron Curie CMA  January 19, 2010 10:32 AM     Prescriptions: Pauline Aus HFA 45 MCG/ACT AERO (LEVALBUTEROL TARTRATE) inhale 2 puffs four times a day as needed  #1 x 3   Entered by:   Carron Curie CMA   Authorized by:   Coralyn Helling MD   Signed by:   Carron Curie CMA on 01/19/2010   Method used:   Electronically to        [facility] [phone][facility] (retail)       [address] [place], [place]  [address]       Ph: [phone] or [phone]       Fax: [phone]   RxID:   [id number] SYMBICORT 160-4.5 MCG/ACT  AERO (BUDESONIDE-FORMOTEROL FUMARATE) 2 puffs two times a day  #1 x 6   Entered by:   Carron Curie CMA   Authorized by:   Coralyn Helling MD   Signed by:   Carron Curie CMA on 01/19/2010   Method used:   Electronically to        [facility]  [address] [phone][facility] (retail)       [address] [place], [place]  [address]       Ph: [phone] or [phone]       Fax: [phone]   RxID:   [id number]

## 2010-06-14 NOTE — Assessment & Plan Note (Signed)
Summary: eph/dx:chest pain   Visit Type:  EPH/device check Referring Provider:  [name], [name] Primary Provider:  Corrin Parker  CC:  sob at times.Marland Kitchenedema at times.Marland Kitchendenies any cp.  History of Present Illness: Since being seen he has had pneumonia, and ended up on Avelox.  Has yet to see Dr. Arbie Cookey.  FWill have follow up.    Current Medications (verified): 1)  Symbicort 160-4.5 Mcg/act  Aero (Budesonide-Formoterol Fumarate) .... 2 Puffs Two Times A Day 2)  Spiriva Handihaler 18 Mcg  Caps (Tiotropium Bromide Monohydrate) .... Use One Inhalation Daily. 3)  Xopenex Hfa 45 Mcg/act Aero (Levalbuterol Tartrate) .... Two Puffs Qid As Needed 4)  Nasonex 50 Mcg/act  Susp (Mometasone Furoate) .... Two Puffs Each Nostril Daily 5)  Zyrtec Allergy 10 Mg Tabs (Cetirizine Hcl) .... Take 1 Tablet By Mouth Once A Day 6)  Adult Aspirin Ec Low Strength 81 Mg  Tbec (Aspirin) .... One Tablet Daily. 7)  Coreg 12.5 Mg  Tabs (Carvedilol) .... Take 1/2 Tablet Two Times A Day 8)  Nitro-Dur 0.4 Mg/hr  Pt24 (Nitroglycerin) .... Place Under Tongue As Needed For Chest Pain. 9)  Lasix 40 Mg  Tabs (Furosemide) .... Take One Tabet Daily. 10)  Actos 45 Mg  Tabs (Pioglitazone Hcl) .... Take One Tablet Daily. 11)  Januvia 100 Mg  Tabs (Sitagliptin Phosphate) .... Take One Tablet Daily. 12)  Nexium 40 Mg  Cpdr (Esomeprazole Magnesium) .... Take One Capsule By Mouth Daily 13)  Lithium Carbonate 300 Mg  Caps (Lithium Carbonate) .... Take One Capsule Two Times A Day 14)  Finasteride 5 Mg Tabs (Finasteride) .Marland Kitchen.. 1 By Mouth Daily 15)  Warfarin Sodium 5 Mg Tabs (Warfarin Sodium) .... Take As Directed Per Coumadin Clinic 16)  Mucinex .... As Needed 17)  Amiodarone Hcl 200 Mg Tabs (Amiodarone Hcl) .... Take 1 Tablet By Mouth Once A Day 18)  Potassium Chloride Cr 10 Meq Cr-Caps (Potassium Chloride) .... Take One Tablet By Mouth Daily 19)  Guaifenesin Dac 30-10-100 Mg/22ml Soln (Pseudoephedrine-Codeine-Gg) .... As  Needed 20)  Acetaminophen 325 Mg  Tabs (Acetaminophen) .... As Needed 21)  Robitussin Dm 100-10 Mg/81ml Syrp (Dextromethorphan-Guaifenesin) .... As Needed For Cough  Allergies: 1)  ! Morphine 2)  ! Lipitor 3)  ! * Ambien 4)  ! Prednisone  Vital Signs:  Patient profile:   75 year old male Height:      71 inches Weight:      179 pounds BMI:     25.06 Pulse rate:   61 / minute Pulse rhythm:   regular BP sitting:   130 / 75  (left arm) Cuff size:   large  Vitals Entered By: Danielle Rankin, CMA (August 09, 2009 3:48 PM)  Physical Exam  General:  Well developed, well nourished, in no acute distress. Head:  normocephalic and atraumatic Eyes:  PERRLA/EOM intact; conjunctiva and lids normal. Lungs:  Decrease breath sounds in left base.   Heart:  PMI slightly displaced.  Normal S1 and S2.  No def murmur.   Abdomen:  Bowel sounds positive; abdomen soft and non-tender without masses, organomegaly, or hernias noted. No hepatosplenomegaly. Rectal:  normal external exam Neurologic:  Alert and oriented x 3.    ICD Specifications Following MD:  Lewayne Bunting, MD     ICD Vendor:  [employer]     ICD Model Number:  7232     ICD Serial Number:  [id number] ICD DOI:  08/28/2004     ICD Implanting MD:  Sharlot Gowda  Ladona Ridgel, MD  Lead 1:    Location: RV     DOI: 08/28/2004     Model #: [id number]     Serial #: [id number]     Status: active Lead 2:    Location: RV     DOI: 01/17/2009     Model #: [id number]     Serial #: [id number]     Status: active  Indications::  ICM   ICD Follow Up ICD Dependent:  No      Episodes Coumadin:  Yes  Brady Parameters Mode VVI     Lower Rate Limit:  55      Tachy Zones VF:  200     VT:  250     VT1:  182     Impression & Recommendations:  Problem # 1:  CAD (ICD-414.00) No recurrent angina.  Continue on medical therpay. His updated medication list for this problem includes:    Adult Aspirin Ec Low Strength 81 Mg Tbec (Aspirin) ..... One tablet daily.    Coreg 12.5 Mg Tabs  (Carvedilol) .Marland Kitchen... Take 1/2 tablet two times a day    Nitro-dur 0.4 Mg/hr Pt24 (Nitroglycerin) .Marland Kitchen... Place under tongue as needed for chest pain.    Warfarin Sodium 5 Mg Tabs (Warfarin sodium) .Marland Kitchen... Take as directed per coumadin clinic  Problem # 2:  OBSTRUCTIVE SLEEP APNEA (ICD-327.23) Not tolerating mask.  Referred to pulm.  Problem # 3:  ATRIAL FIBRILLATION (ICD-427.31)  Has severe COPD.  On Sleep apnea.  Taking amiodarone to maintain normal rythym.   His updated medication list for this problem includes:    Adult Aspirin Ec Low Strength 81 Mg Tbec (Aspirin) ..... One tablet daily.    Coreg 12.5 Mg Tabs (Carvedilol) .Marland Kitchen... Take 1/2 tablet two times a day    Warfarin Sodium 5 Mg Tabs (Warfarin sodium) .Marland Kitchen... Take as directed per coumadin clinic    Amiodarone Hcl 200 Mg Tabs (Amiodarone hcl) .Marland Kitchen... 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  Seeing Dr.Early for evaluation of this.  Scheduled for CT of chest in next couple of weeks.  Will review on followup.  Not a good surgical candidate in general.   Patient Instructions: 1)  Your physician recommends that you schedule a follow-up appointment in: 3 MONTHS 2)  Your physician recommends that you continue on your current medications as directed. Please refer to the Current Medication list given to you today.

## 2010-06-14 NOTE — Progress Notes (Signed)
Summary: Medical history information  Phone Note Other Incoming   Caller: Wife Summary of Call: Patient has not had a colonoscopy his wife wanted you to be informed . Initial call taken by: Georga Bora,  June 06, 2010 9:39 AM

## 2010-06-14 NOTE — Medication Information (Signed)
Summary: rov/eac  Anticoagulant Therapy  Managed by: Eda Keys, PharmD Referring MD: [name] MD PCP: Corrin Parker Supervising MD: Gala Romney MD, Reuel Boom Indication 1: Atrial Fibrillation (ICD-427.31) Lab Used: [facility] Site: [facility] INR POC 1.8 INR RANGE 2 - 3  Dietary changes: no    Health status changes: no    Bleeding/hemorrhagic complications: no    Recent/future hospitalizations: no    Any changes in medication regimen? yes       Details: levothyroxine  Recent/future dental: no  Any missed doses?: no       Is patient compliant with meds? yes       Allergies: 1)  ! Morphine 2)  ! Lipitor 3)  ! * Ambien 4)  ! Prednisone  Anticoagulation Management History:      The patient is taking warfarin and comes in today for a routine follow up visit.  Positive risk factors for bleeding include an age of 21 years or older and presence of serious comorbidities.  The bleeding index is 'intermediate risk'.  Positive CHADS2 values include History of CHF, History of HTN, and History of Diabetes.  Negative CHADS2 values include Age > 68 years old.  The start date was 05/29/2007.  His last INR was 8.4 ratio.  Anticoagulation responsible provider: Cory Kitt MD, Reuel Boom.  INR POC: 1.8.  Cuvette Lot#: [id number].  Exp: 12/2010.    Anticoagulation Management Assessment/Plan:      The patient's current anticoagulation dose is Warfarin sodium 5 mg tabs: take as directed per coumadin clinic.  The target INR is 2 - 3.  The next INR is due 11/01/2009.  Anticoagulation instructions were given to patient.  Results were reviewed/authorized by Eda Keys, PharmD.  He was notified by Eda Keys.         Prior Anticoagulation Instructions: INR 3.6 Skip today's dose then resume 2.5mg s everyday except 5mg s on Tuesdays. Recheck in 4 weeks.   Current Anticoagulation Instructions: INR 1.8  Take 1 tablet today.  Then return to normal dosing schedule of 1 tablet on Tuesday and  1/2 tablet all other days.  Return to clinic in 4 weeks.

## 2010-06-14 NOTE — Miscellaneous (Signed)
  Clinical Lists Changes  Observations: Added new observation of PAST SURG HX: Insertion of a new rate sensing pacing lead with  removal of a previous implanted ICD and insertion of device back in the pocket with defibrillation threshold testing.   Partial colectomy 1995 CABG 1996 AAA repair and graft 2000 Aorto-bifemoral bypass 1999 by [name] Early Implantable cardio defibrillator August 28, 2004 by [name] Stents placed to the saphenous vein graft Radiofrequency ablation (A flutter) in 2006. Nasal surgery  (06/07/2010 16:05) Added new observation of PAST MED HX: CAD (ICD-414.00)- post coronary artery bypass graft surgery with prior percutaneos intervention of the saphenous vein graft with known saphenous vein graft disease (cath 10/2009). 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)      - 5.8 cm abd u/s Nov 2010 Descending thoracic aorta aneurysm Bilateral popliteal artery aneurysm CAD s/p MI Ischemic cardiomyopathy with EF 25% (echo 10/2009) Paroxysmal atrial fibrillation (tachy/brady syndrome: has pacemaker and defibrillator) Chronic renal insufficiency (baseline Cr 1.5) HTN Peripheral vascular disease Dyslipidemia Diabetes mellitus GERD Hiatal hernia Diverticular disease BPH COPD      - 05/09/08 PFT FEV1 1.84 (55%), FVC 4.42 (95%), TLC 6.46 (92%), DLCO 72%, no BD response Pulmonary nodule (left base): resolved on f/u CT 03/02/10 Allergic rhinitis Shingles OSA      - PSG 06/14/09 RDI 17, PLMI 96      - Intolerant of CPAP or BPAP (06/07/2010 16:05) Added new observation of PRIMARY MD: Michell Heinrich M.D. (06/07/2010 16:05)       Past History:  Past Medical History: CAD (ICD-414.00)- post coronary artery bypass graft surgery with prior percutaneos intervention of the saphenous vein graft with known saphenous vein graft disease (cath 10/2009). Proximal abdominal aortic aneurysm (at the level of the diaphragmatic  hiatus)      - 5.8 cm abd u/s Nov 2010 Descending thoracic aorta aneurysm Bilateral popliteal artery aneurysm CAD s/p MI Ischemic cardiomyopathy with EF 25% (echo 10/2009) Paroxysmal atrial fibrillation (tachy/brady syndrome: has pacemaker and defibrillator) Chronic renal insufficiency (baseline Cr 1.5) HTN Peripheral vascular disease Dyslipidemia Diabetes mellitus GERD Hiatal hernia Diverticular disease BPH COPD      - 05/09/08 PFT FEV1 1.84 (55%), FVC 4.42 (95%), TLC 6.46 (92%), DLCO 72%, no BD response Pulmonary nodule (left base): resolved on f/u CT 03/02/10 Allergic rhinitis Shingles OSA      - PSG 06/14/09 RDI 17, PLMI 96      - Intolerant of CPAP or BPAP  Past Surgical History: Insertion of a new rate sensing pacing lead with  removal of a previous implanted ICD and insertion of device back in the pocket with defibrillation threshold testing.   Partial colectomy 1995 CABG 1996 AAA repair and graft 2000 Aorto-bifemoral bypass 1999 by [name] Early Implantable cardio defibrillator August 28, 2004 by [name] Stents placed to the saphenous vein graft Radiofrequency ablation (A flutter) in 2006. Nasal surgery

## 2010-06-14 NOTE — Procedures (Signed)
Summary: device check   Current Medications (verified): 1)  Symbicort 160-4.5 Mcg/act  Aero (Budesonide-Formoterol Fumarate) .... 2 Puffs Two Times A Day 2)  Spiriva Handihaler 18 Mcg  Caps (Tiotropium Bromide Monohydrate) .... Use One Inhalation Daily. 3)  Nasonex 50 Mcg/act  Susp (Mometasone Furoate) .... Two Puffs Each Nostril Daily 4)  Zyrtec Allergy 10 Mg Tabs (Cetirizine Hcl) .... Take 1 Tablet By Mouth Once A Day 5)  Adult Aspirin Ec Low Strength 81 Mg  Tbec (Aspirin) .... One Tablet Daily. 6)  Coreg 12.5 Mg  Tabs (Carvedilol) .... Take 1/2 Tablet Two Times A Day 7)  Nitro-Dur 0.4 Mg/hr  Pt24 (Nitroglycerin) .... Place Under Tongue As Needed For Chest Pain. 8)  Lasix 40 Mg  Tabs (Furosemide) .... Take 1 Tablet By Mouth Twice A Day 9)  Januvia 100 Mg  Tabs (Sitagliptin Phosphate) .... Take One Tablet Daily. 10)  Lithium Carbonate 300 Mg  Caps (Lithium Carbonate) .... Take One Capsule Two Times A Day 11)  Finasteride 5 Mg Tabs (Finasteride) .Marland Kitchen.. 1 By Mouth Daily 12)  Warfarin Sodium 5 Mg Tabs (Warfarin Sodium) .... Take As Directed Per Coumadin Clinic 13)  Amiodarone Hcl 200 Mg Tabs (Amiodarone Hcl) .... Take 1 Tablet By Mouth Once A Day 14)  Potassium Chloride Cr 10 Meq Cr-Caps (Potassium Chloride) .... Take One Tablet By Mouth Daily 15)  Guaifenesin Dac 30-10-100 Mg/80ml Soln (Pseudoephedrine-Codeine-Gg) .... As Needed 16)  Acetaminophen 325 Mg  Tabs (Acetaminophen) .... As Needed 17)  Robitussin Dm 100-10 Mg/61ml Syrp (Dextromethorphan-Guaifenesin) .... As Needed For Cough 18)  Synthroid 125 Mcg Tabs (Levothyroxine Sodium) .... Take 1 Tablet By Mouth Once A Day 19)  Flomax 0.4 Mg Caps (Tamsulosin Hcl) .... Take 1 Tablet By Mouth Once A Day 20)  Lotrisone 1-0.05 % Crea (Clotrimazole-Betamethasone) .... Two Times A Day 21)  Topicort 0.25 % Crea (Desoximetasone) .... Two Times A Day 22)  Proventil Hfa 108 (90 Base) Mcg/act Aers (Albuterol Sulfate) .... Two Puffs Up To Four Times Per Day  As Needed  Allergies (verified): 1)  ! Morphine 2)  ! Lipitor 3)  ! * Ambien 4)  ! Prednisone   ICD Specifications Following MD:  Lewayne Bunting, MD     Referring MD:  [facility] ICD Vendor:  [employer]     ICD Model Number:  7232     ICD Serial Number:  [id number] ICD DOI:  08/28/2004     ICD Implanting MD:  Lewayne Bunting, MD  Lead 1:    Location: RV     DOI: 08/28/2004     Model #: 4098     Serial #: [id number]     Status: active Lead 2:    Location: RV     DOI: 01/17/2009     Model #: 5621     Serial #: [id number]     Status: active  Indications::  ICM   ICD Follow Up Battery Voltage:  3.03 V     Charge Time:  8.24 seconds     Underlying rhythm:  SR ICD Dependent:  No       ICD Device Measurements Right Ventricle:  Amplitude: 12.8 mV, Impedance: 464 ohms, Threshold: 1.0 V at 0.3 msec Shock Impedance: 50/68 ohms   Episodes MS Episodes:  0     Percent Mode Switch:  0     Coumadin:  Yes Shock:  0     ATP:  0     Nonsustained:  0     Atrial  Therapies:  0 Ventricular Pacing:  28%  Brady Parameters Mode VVI     Lower Rate Limit:  55      Tachy Zones VF:  200     VT:  250 FVT VIA VF     VT1:  182     Tech Comments:  NORMAL DEVICE FUNCTION.  NO EPISODES SINCE LAST CHECK.  NO CHANGES MADE. ROV IN 3 MTHS W/GT. Vella Kohler  January 17, 2010 4:26 PM

## 2010-06-14 NOTE — Progress Notes (Signed)
Summary: SPIRIVA REFILL  Phone Note Refill Request Message from:  Pharmacy on [facility] [phone]  Refills Requested: Medication #1:  SPIRIVA HANDIHALER 18 MCG  CAPS Use one inhalation daily. Initial call taken by: Kandice Hams CMA,  March 19, 2010 5:22 PM [facility]er[phone]    Prescriptions: SPIRIVA HANDIHALER 18 MCG  CAPS (TIOTROPIUM BROMIDE MONOHYDRATE) Use one inhalation daily.  #30 x 4   Entered by:   Kandice Hams CMA   Authorized by:   Coralyn Helling MD   Signed by:   Kandice Hams CMA on 03/19/2010   Method used:   Faxed to ...       [facility]  [address] [phone][facility] (retail)       [address]       [place], [place]  [address]       Ph: [phone] or [phone]       Fax: [phone]   RxID:   [id number]

## 2010-06-14 NOTE — Medication Information (Signed)
Summary: rov/tm  Anticoagulant Therapy  Managed by: Charolotte Eke, PharmD Referring MD: [name] MD PCP: Corrin Parker Supervising MD: Ladona Ridgel MD, Sharlot Gowda Indication 1: Atrial Fibrillation (ICD-427.31) Lab Used: [facility] Site: [facility] INR POC 2.8 INR RANGE 2 - 3  Dietary changes: no    Health status changes: no    Bleeding/hemorrhagic complications: no    Recent/future hospitalizations: no    Any changes in medication regimen? no    Recent/future dental: no  Any missed doses?: no       Is patient compliant with meds? yes       Current Medications (verified): 1)  Symbicort 160-4.5 Mcg/act  Aero (Budesonide-Formoterol Fumarate) .... 2 Puffs Two Times A Day 2)  Spiriva Handihaler 18 Mcg  Caps (Tiotropium Bromide Monohydrate) .... Use One Inhalation Daily. 3)  Xopenex Hfa 45 Mcg/act Aero (Levalbuterol Tartrate) .... Two Puffs Qid As Needed 4)  Nasonex 50 Mcg/act  Susp (Mometasone Furoate) .... Two Puffs Each Nostril Daily 5)  Zyrtec Allergy 10 Mg Tabs (Cetirizine Hcl) .... Take 1 Tablet By Mouth Once A Day 6)  Adult Aspirin Ec Low Strength 81 Mg  Tbec (Aspirin) .... One Tablet Daily. 7)  Coreg 12.5 Mg  Tabs (Carvedilol) .... Take 1/2 Tablet Two Times A Day 8)  Nitro-Dur 0.4 Mg/hr  Pt24 (Nitroglycerin) .... Place Under Tongue As Needed For Chest Pain. 9)  Lasix 40 Mg  Tabs (Furosemide) .... Take One Tabet Daily. 10)  Actos 45 Mg  Tabs (Pioglitazone Hcl) .... Take One Tablet Daily. 11)  Januvia 100 Mg  Tabs (Sitagliptin Phosphate) .... Take One Tablet Daily. 12)  Nexium 40 Mg  Cpdr (Esomeprazole Magnesium) .... Take One Capsule By Mouth Daily 13)  Lithium Carbonate 300 Mg  Caps (Lithium Carbonate) .... Take One Capsule Two Times A Day 14)  Avodart 0.5 Mg Caps (Dutasteride) .... Take 1 Tablet By Mouth Once A Day 15)  Warfarin Sodium 5 Mg Tabs (Warfarin Sodium) .... Take As Directed Per Coumadin Clinic 16)  Mucinex .... As Needed 17)  Amiodarone Hcl 200 Mg Tabs  (Amiodarone Hcl) .... Take One Tablet By Mouth Daily 18)  Potassium Chloride Cr 10 Meq Cr-Caps (Potassium Chloride) .... Take One Tablet By Mouth Daily  Allergies (verified): 1)  ! Morphine 2)  ! Lipitor 3)  ! * Ambien 4)  ! Prednisone  Anticoagulation Management History:      Positive risk factors for bleeding include an age of 73 years or older and presence of serious comorbidities.  The bleeding index is 'intermediate risk'.  Positive CHADS2 values include History of CHF, History of HTN, and History of Diabetes.  Negative CHADS2 values include Age > 66 years old.  The start date was 05/29/2007.  His last INR was 8.4 ratio.  Anticoagulation responsible provider: Ladona Ridgel MD, Sharlot Gowda.  INR POC: 2.8.  Exp: 06/2010.    Anticoagulation Management Assessment/Plan:      The patient's current anticoagulation dose is Warfarin sodium 5 mg tabs: take as directed per coumadin clinic.  The target INR is 2 - 3.  The next INR is due 06/22/2009.  Anticoagulation instructions were given to patient.  Results were reviewed/authorized by Charolotte Eke, PharmD.  He was notified by Charolotte Eke, PharmD.         Prior Anticoagulation Instructions: INR 3.4 Skip today then take 1/2 pill everyday except 1 pill on Tuesdays. Recheck in 2 weeks.   Current Anticoagulation Instructions: The patient is to continue with the same dose of  coumadin.  This dosage includes:

## 2010-06-14 NOTE — Progress Notes (Signed)
Summary: cpap/ pna/ f/u appt  Phone Note Call from Patient Call back at Home Phone [phone]   Caller: Patient Call For: sood Summary of Call: pt has pns (per spouse). his dr has him on abx. has an appt to see dr sood in 2 wks. pt "might use his cpap beginning tonight" since his coughing is subsiding now. do they need to cancel appt or is this ok?  Initial call taken by: Tivis Ringer, CNA,  July 13, 2009 12:07 PM  Follow-up for Phone Call        Spoke with pt.  He states that since he was seen last on 2/22, he had PNA and was not able to use his cpap machine due to coughing.  He has appt to followup with [facility] on 08/01/09.  Wants to know if he should keep this appt.  He states that he will try to use mahine tonight.  Please advise thanks Vernie Murders  July 13, 2009 12:16 PM   Additional Follow-up for Phone Call Additional follow up Details #1::        pt seen on 08/01/09. Additional Follow-up by: Coralyn Helling MD,  August 01, 2009 4:59 PM

## 2010-06-14 NOTE — Assessment & Plan Note (Signed)
Summary: fingernail loose from skin/vfw   Vital Signs:  Patient profile:   75 year old male Height:      70.5 inches Weight:      173 pounds BMI:     24.56 Temp:     97.5 degrees F oral Pulse rate:   56 / minute Pulse rhythm:   regular BP sitting:   112 / 66  (right arm) Cuff size:   regular  Vitals Entered By: Francee Piccolo CMA Duncan Dull) (May 08, 2010 12:14 PM)  History of Present Illness: 41 y/lo WM here for fingernail pain. No trauma.  Notes for last 4-6 months the fingernail on ring finger of left  hand has gradually become greyish-green colored, has loosed from nail bed lately and hurts when it is manipulated.  When left alone it doesn't bother him.  Nail on middle finger of left hand appears similar but not as bad and not loose or painful. Asks for RF of synthroid 125 micrograms once daily.  He cannot recall his last TSH check and we haven't received any old records from Dr. Dagoberto Ligas yet. When given the option of nail removal today he voiced his preference to avoid this if at all possible. Otherwise has been feeling fine lately. Recent cardiology f/u with Dr.'s Riley Kill and Ladona Ridgel are noted: no changes to therapies.  Of note, it has been determined that he is not a good surgical candidate for his thoracic aortic aneurism.  Current Medications (verified): 1)  Symbicort 160-4.5 Mcg/act  Aero (Budesonide-Formoterol Fumarate) .... 2 Puffs Two Times A Day 2)  Spiriva Handihaler 18 Mcg  Caps (Tiotropium Bromide Monohydrate) .... Use One Inhalation Daily. 3)  Nasonex 50 Mcg/act  Susp (Mometasone Furoate) .... Two Puffs Each Nostril Daily 4)  Zyrtec Allergy 10 Mg Tabs (Cetirizine Hcl) .... Take 1 Tablet By Mouth Once A Day 5)  Adult Aspirin Ec Low Strength 81 Mg  Tbec (Aspirin) .... One Tablet Daily. 6)  Coreg 12.5 Mg  Tabs (Carvedilol) .... Take 1/2 Tablet Two Times A Day 7)  Nitro-Dur 0.4 Mg/hr  Pt24 (Nitroglycerin) .... Place Under Tongue As Needed For Chest Pain. 8)  Lasix 40  Mg  Tabs (Furosemide) .... Take 1tablet By Mouth Two Times A Day 9)  Januvia 100 Mg  Tabs (Sitagliptin Phosphate) .... Take One Tablet Daily. 10)  Lithium Carbonate 300 Mg  Caps (Lithium Carbonate) .... Take One Capsule Two Times A Day 11)  Finasteride 5 Mg Tabs (Finasteride) .Marland Kitchen.. 1 By Mouth Daily 12)  Warfarin Sodium 5 Mg Tabs (Warfarin Sodium) .... Take As Directed Per Coumadin Clinic 13)  Amiodarone Hcl 200 Mg Tabs (Amiodarone Hcl) .... Take 1 Tablet By Mouth Once A Day 14)  Potassium Chloride Cr 10 Meq Cr-Caps (Potassium Chloride) .... Take One Tablet By Mouth Daily 15)  Guaifenesin Dac 30-10-100 Mg/57ml Soln (Pseudoephedrine-Codeine-Gg) .... As Needed 16)  Acetaminophen 325 Mg  Tabs (Acetaminophen) .... As Needed 17)  Robitussin Dm 100-10 Mg/59ml Syrp (Dextromethorphan-Guaifenesin) .... As Needed For Cough 18)  Synthroid 125 Mcg Tabs (Levothyroxine Sodium) .... Take 1 Tablet By Mouth Once A Day 19)  Flomax 0.4 Mg Caps (Tamsulosin Hcl) .... Take 1 Tablet By Mouth Once A Day 20)  Xopenex Hfa 45 Mcg/act Aero (Levalbuterol Tartrate) .... Inhale 2 Puffs Four Times A Day As Needed 21)  Lotrisone 1-0.05 % Crea (Clotrimazole-Betamethasone) .... 2 X A Day 22)  Topicort 0.25 % Crea (Desoximetasone) .... As Needed 23)  Benicar 20 Mg Tabs (Olmesartan Medoxomil) .... Take  One-Half Tablet By Mouth Daily 24)  Isosorbide Mononitrate Cr 30 Mg Xr24h-Tab (Isosorbide Mononitrate) .... Take One Tablet By Mouth Once Daily 25)  Cyanocobalamin 1000 Mcg/ml Soln (Cyanocobalamin) .... Once Monthly 26)  Xopenex Hfa 45 Mcg/act Aero (Levalbuterol Tartrate) .... Uad  Allergies (verified): 1)  ! Morphine 2)  ! Lipitor 3)  ! * Ambien 4)  ! Prednisone  Past History:  Past Medical History: Last updated: 10/02/2009 CAD (ICD-414.00)- post coronary artery bypass graft surgery with prior percutaneos intervention of the saphenous vein graft with known saphenous vein graft disease. Abdominal aortic aneurysm      - 5.8 cm  abd u/s Nov 2010 Descending thoracic aorta aneurysm Bilateral popliteal artery aneurysm CAD s/p MI Ischemic cardiomyopathy with EF 40% Paroxysmal atrial fibrillation HTN Peripheral vascular disease Dyslipidemia Diabetes mellitus GERD Hiatal hernia Diverticular disease BPH COPD      - 05/09/08 PFT FEV1 1.84 (55%), FVC 4.42 (95%), TLC 6.46 (92%), DLCO 72%, no BD response Pneumonia January, 2009 Allergic rhinitis Shingles OSA      - PSG 06/14/09 RDI 17, PLMI 96      - Intolerant of CPAP or BPAP  Past Surgical History: Last updated: 02/15/2009 Insertion of a new rate sensing pacing lead with  removal of a previous implanted ICD and insertion of device back in the pocket with defibrillation threshold testing.   Partial colectomy 1995 CABG 1996 Aorto-bifemoral bypass 1999 by [name] Early implantable cardio defibrillator August 28, 2004 by [name] stents placed to the saphenous vein graft and also atrial fibrillation. radiofrequency ablation in 2006. Nasal surgery  Physical Exam  General:  VS: noted, all normal. Gen: Alert, well appearing, oriented x 4. Fingernails: right hand normal. Left hand 4th digit fingernail is a dull grey color with hint of green tinge, slight thickening.  It is loose, but still attached at the base.  Mildly tender when nail is manipulated any.  No erythema, swelling, or tenderness to periungual skin/soft tissue.  Finger ROM fully intact.  Digits warm. Feet: extensive thickening of all toenails, some white, flaky substance is noted under nails as well. Skin of feet is without erythema or rash.  Slight xerosis of skin of feet is noted.   Impression & Recommendations:  Problem # 1:  ONYCHOMYCOSIS (ICD-110.1) Unable to access the area under the nail adequately in order to get a specimen for fungal culture or KOH. We discussed the option of nail removal, but my hesitancy due to his anticoagulated state plus his hesitancy due to fear of pain  resulted in a choice for oral antifungal trial.  Hopefully this will help his toes as well. We discussed the fact that this medication carries risk of liver injury and adverse medication interactions, particularly with coumadin.  He agreed to trial of med and understands that we will need to check his PT/INR about 1 wk after starting the med. Anticipate 38mo trial of griseofulvin 500mg  once daily.  Will also monitor LFTs about 1 month into therapy. Will RF synthroid once daily today, but since it appears we may not get his old records in a timely manner, I have printed out orders to go ahead and get lipid panel, hepatic panel, BMET, CBC, and HbA1c in 1 wk when he gets his PT/INR check.     Grifulvin V 500 Mg Tabs (Griseofulvin microsize) .Marland Kitchen... 1 tab by mouth qd  Orders: TLB-Lipid Panel (80061-LIPID) TLB-BMP (Basic Metabolic Panel-BMET) (80048-METABOL) TLB-CBC Platelet - w/Differential (85025-CBCD) TLB-Hepatic/Liver Function Pnl (80076-HEPATIC)  TLB-TSH (Thyroid Stimulating Hormone) (84443-TSH) TLB-A1C / Hgb A1C (Glycohemoglobin) (83036-A1C) TLB-PT (Protime) (85610-PTP)  Complete Medication List: 1)  Symbicort 160-4.5 Mcg/act Aero (Budesonide-formoterol fumarate) .... 2 puffs two times a day 2)  Spiriva Handihaler 18 Mcg Caps (Tiotropium bromide monohydrate) .... Use one inhalation daily. 3)  Nasonex 50 Mcg/act Susp (Mometasone furoate) .... Two puffs each nostril daily 4)  Zyrtec Allergy 10 Mg Tabs (Cetirizine hcl) .... Take 1 tablet by mouth once a day 5)  Adult Aspirin Ec Low Strength 81 Mg Tbec (Aspirin) .... One tablet daily. 6)  Coreg 12.5 Mg Tabs (Carvedilol) .... Take 1/2 tablet two times a day 7)  Nitro-dur 0.4 Mg/hr Pt24 (Nitroglycerin) .... Place under tongue as needed for chest pain. 8)  Lasix 40 Mg Tabs (Furosemide) .... Take 1tablet by mouth two times a day 9)  Januvia 100 Mg Tabs (Sitagliptin phosphate) .... Take one tablet daily. 10)  Lithium Carbonate 300 Mg Caps  (Lithium carbonate) .... Take one capsule two times a day 11)  Finasteride 5 Mg Tabs (Finasteride) .Marland Kitchen.. 1 by mouth daily 12)  Warfarin Sodium 5 Mg Tabs (Warfarin sodium) .... Take as directed per coumadin clinic 13)  Amiodarone Hcl 200 Mg Tabs (Amiodarone hcl) .... Take 1 tablet by mouth once a day 14)  Potassium Chloride Cr 10 Meq Cr-caps (Potassium chloride) .... Take one tablet by mouth daily 15)  Guaifenesin Dac 30-10-100 Mg/20ml Soln (Pseudoephedrine-codeine-gg) .... As needed 16)  Acetaminophen 325 Mg Tabs (Acetaminophen) .... As needed 17)  Robitussin Dm 100-10 Mg/7ml Syrp (Dextromethorphan-guaifenesin) .... As needed for cough 18)  Synthroid 125 Mcg Tabs (Levothyroxine sodium) .... Take 1 tablet by mouth once a day 19)  Flomax 0.4 Mg Caps (Tamsulosin hcl) .... Take 1 tablet by mouth once a day 20)  Xopenex Hfa 45 Mcg/act Aero (Levalbuterol tartrate) .... Inhale 2 puffs four times a day as needed 21)  Lotrisone 1-0.05 % Crea (Clotrimazole-betamethasone) .... 2 x a day 22)  Topicort 0.25 % Crea (Desoximetasone) .... As needed 23)  Benicar 20 Mg Tabs (Olmesartan medoxomil) .... Take one-half tablet by mouth daily 24)  Isosorbide Mononitrate Cr 30 Mg Xr24h-tab (Isosorbide mononitrate) .... Take one tablet by mouth once daily 25)  Cyanocobalamin 1000 Mcg/ml Soln (Cyanocobalamin) .... Once monthly 26)  Xopenex Hfa 45 Mcg/act Aero (Levalbuterol tartrate) .... Uad 27)  Grifulvin V 500 Mg Tabs (Griseofulvin microsize) .Marland Kitchen.. 1 tab by mouth qd  Other Orders: Prescription Created Electronically [phone]) Vit B12 1000 mcg (X9147)  Patient Instructions: 1)  Make sure you have a routine diabetes follow up visit with me in the next 2 months (may be already scheduled). 2)  Take orders to [facility] on [address] to get blood work in 1 wk. Prescriptions: GRIFULVIN V 500 MG TABS (GRISEOFULVIN MICROSIZE) 1 tab by mouth qd  #30 x 2   Entered and Authorized by:   Michell Heinrich M.D.   Signed by:    Michell Heinrich M.D. on 05/08/2010   Method used:   [facility]ronically to        [facility]  [phone] ([facility]ail)       [address] [place], [place]  [address]       Ph: [phone] or [phone]       Fax: [phone]   RxID:   [id number]    Medication Administration  Injection # 1:    Medication: Vit B12 1000 mcg  Diagnosis: PERNICIOUS ANEMIA (ICD-281.0)    Route: IM    Site: R deltoid    Exp Date: 12/2011    Lot #: [id number]    Mfr: [employer]    Patient tolerated injection without complications    Given by: Francee Piccolo CMA Duncan Dull) (May 08, 2010 12:45 PM)  Orders Added: 1)  Prescription Created Electronically [G8553] 2)  Vit B12 1000 mcg [J3420] 3)  Est. Patient Level II [99212] 4)  TLB-Lipid Panel [80061-LIPID] 5)  TLB-BMP (Basic Metabolic Panel-BMET) [80048-METABOL] 6)  TLB-CBC Platelet - w/Differential [85025-CBCD] 7)  TLB-Hepatic/Liver Function Pnl [80076-HEPATIC] 8)  TLB-TSH (Thyroid Stimulating Hormone) [84443-TSH] 9)  TLB-A1C / Hgb A1C (Glycohemoglobin) [83036-A1C] 10)  TLB-PT (Protime) [85610-PTP]

## 2010-06-14 NOTE — Cardiovascular Report (Signed)
Summary: Health Management Program Summary Report   Health Management Program Summary Report   Imported By: Roderic Ovens 11/03/2009 11:11:08  _____________________________________________________________________  External Attachment:    Type:   Image     Comment:   External Document

## 2010-06-14 NOTE — Progress Notes (Signed)
  Phone Note Other Incoming   Summary of Call: Notified pt of lab results.  Recommended he change levothyroxine dosing to whole tab qd alternating with 1/2 tab once daily (125 micrograms). Return hemoccult cards as discussed.  F/u in office next week as scheduled. Initial call taken by: Michell Heinrich M.D.,  June 01, 2010 4:27 PM

## 2010-06-14 NOTE — Assessment & Plan Note (Signed)
Summary:  post-hosp   Referring Provider:  [name], [name] Primary Provider:  Corrin Parker   History of Present Illness: Roberto [name] had burning sensation in the chest right after getting out of shower.  Roberto [name] was going up to the store, and stopped at the fire station.  He ended up coming to [facility] where he was admitted for a r/o MI.  Roberto [name] had episode of chest pain, had no further symptoms, and then subsequently ruled out at [facility].  He checked the defib and found no evidence of atrial fibrillation.  Enzymes were negative, and Dr. Ladona Ridgel discharged. Since discharge, he has had no further symptoms.  Recent cath data reviewed in detail with Roberto [name].  Cath fillms reviewed in detail with Roberto [name] and wife.  Current Medications (verified): 1)  Symbicort 160-4.5 Mcg/act  Aero (Budesonide-Formoterol Fumarate) .... 2 Puffs Two Times A Day 2)  Spiriva Handihaler 18 Mcg  Caps (Tiotropium Bromide Monohydrate) .... Use One Inhalation Daily. 3)  Xopenex Hfa 45 Mcg/act Aero (Levalbuterol Tartrate) .... Two Puffs Qid As Needed 4)  Nasonex 50 Mcg/act  Susp (Mometasone Furoate) .... Two Puffs Each Nostril Daily 5)  Zyrtec Allergy 10 Mg Tabs (Cetirizine Hcl) .... Take 1 Tablet By Mouth Once A Day 6)  Adult Aspirin Ec Low Strength 81 Mg  Tbec (Aspirin) .... One Tablet Daily. 7)  Coreg 12.5 Mg  Tabs (Carvedilol) .... Take 1/2 Tablet Two Times A Day 8)  Nitro-Dur 0.4 Mg/hr  Pt24 (Nitroglycerin) .... Place Under Tongue As Needed For Chest Pain. 9)  Lasix 40 Mg  Tabs (Furosemide) .... Take One Tabet Daily. 10)  Actos 45 Mg  Tabs (Pioglitazone Hcl) .... Take One Tablet Daily. 11)  Januvia 100 Mg  Tabs (Sitagliptin Phosphate) .... Take One Tablet Daily. 12)  Nexium 40 Mg  Cpdr (Esomeprazole Magnesium) .... Take One Capsule By Mouth Daily 13)  Lithium Carbonate 300 Mg  Caps (Lithium Carbonate) .... Take One Capsule Two Times A Day 14)  Avodart 0.5 Mg Caps (Dutasteride) .... Take 1 Tablet By Mouth Once A  Day 15)  Warfarin Sodium 5 Mg Tabs (Warfarin Sodium) .... Take As Directed Per Coumadin Clinic 16)  Mucinex .... As Needed 17)  Amiodarone Hcl 200 Mg Tabs (Amiodarone Hcl) .... Take One Tablet By Mouth Daily 18)  Potassium Chloride Cr 10 Meq Cr-Caps (Potassium Chloride) .... Take One Tablet By Mouth Daily 19)  Guaifenesin Dac 30-10-100 Mg/67ml Soln (Pseudoephedrine-Codeine-Gg) .... As Needed 20)  Acetaminophen 325 Mg  Tabs (Acetaminophen) .... As Needed  Allergies (verified): 1)  ! Morphine 2)  ! Lipitor 3)  ! * Ambien 4)  ! Prednisone  Vital Signs:  Roberto [name] profile:   75 year old male Height:      71 inches Weight:      178 pounds BMI:     24.92 Pulse rate:   60 / minute Resp:     16 per minute BP sitting:   149 / 80  (right arm)  Vitals Entered By: Marrion Coy, CNA (June 09, 2009 2:36 PM)  Physical Exam  General:  Well developed, well nourished, in no acute distress. Lungs:  Clear bilaterally to auscultation and percussion.  Slight prolonged expiration. Heart:  Normal S1 and S2.  Soft apical murmur. Abdomen:  No abdominal masses, or hepatosplenomegaly. Extremities:  No edema.  Neurologic:  Alert and oriented x 3.   EKG  Procedure date:  06/09/2009  Findings:      NSR.  Left axis.  Delay in  R wave progression.  Some v pacing.  Lateral ST and T abnormalities.  CXR  Procedure date:  06/06/2009  Findings:       Comparison: 04/27/2009    Findings: The cardiac silhouette, mediastinal and hilar contours   are within normal limits and stable.  There are stable surgical   changes related to bypass surgery.  The pacer wire / AICD are in   good position, unchanged.  There are streaky bibasilar scarring   changes.  No infiltrates, edema or effusions.  The bony thorax is   intact.    IMPRESSION:    1.  Chronic lung changes with bibasilar scarring.   2.  No acute pulmonary findings.    ICD Specifications Following MD:  Lewayne Bunting, MD     ICD Vendor:   [employer]     ICD Model Number:  7232     ICD Serial Number:  [id number] ICD DOI:  08/28/2004     ICD Implanting MD:  Lewayne Bunting, MD  Lead 1:    Location: [facility]     DOI: 08/28/2004     Model #: 6949     Serial #: [id number]     Status: active Lead 2:    Location: [facility]     DOI: 01/17/2009     Model #: [id number]     Serial #: [id number]     Status: active  Indications::  ICM   ICD Follow Up ICD Dependent:  No      Episodes Coumadin:  Yes  Brady Parameters Mode VVI     Lower Rate Limit:  55      Tachy Zones VF:  200     VT:  250     VT1:  182     Impression & Recommendations:  Problem # 1:  CAD, ARTERY BYPASS GRAFT (ICD-414.04) Difficult situation.  SVG graft disease with some symptoms, not ideal for reCABG, and stable to some extent on meds.  Could dilate SVG but not good for stent secondary to mismatch of graft with anuerysmal dilitation.  Other SVG segmentally diseased (prior Stenting).  Continue meds for now with early follow up visit. His updated medication list for this problem includes:    Adult Aspirin Ec Low Strength 81 Mg Tbec (Aspirin) ..... One tablet daily.    Coreg 12.5 Mg Tabs (Carvedilol) .Marland Kitchen... Take 1/2 tablet two times a day    Nitro-dur 0.4 Mg/hr Pt24 (Nitroglycerin) .Marland Kitchen... Place under tongue as needed for chest pain.    Warfarin Sodium 5 Mg Tabs (Warfarin sodium) .Marland Kitchen... 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  Dr Arbie Cookey has seen and is scheduled for follow up in April at [facility].    Problem # 3:  ATRIAL FIBRILLATION (ICD-427.31) No recent recurrence.  Will see back soon, and will need amiodarone followup studies. His updated medication list for this problem includes:    Adult Aspirin Ec Low Strength 81 Mg Tbec (Aspirin) ..... One tablet daily.    Coreg 12.5 Mg Tabs (Carvedilol) .Marland Kitchen... Take 1/2 tablet two times a day    Warfarin Sodium 5 Mg Tabs  (Warfarin sodium) .Marland Kitchen... Take as directed per coumadin clinic    Amiodarone Hcl 200 Mg Tabs (Amiodarone hcl) .Marland Kitchen... Take one tablet by mouth daily  Orders: EKG w/ Interpretation (93000)  Problem # 4:  HYPERLIPIDEMIA (ICD-272.4) Discuss at next OV.  Problem #  5:  DIABETES MELLITUS, TYPE II (ICD-250.00) Followed by Dr. Dagoberto Ligas. His updated medication list for this problem includes:    Adult Aspirin Ec Low Strength 81 Mg Tbec (Aspirin) ..... One tablet daily.    Actos 45 Mg Tabs (Pioglitazone hcl) .Marland Kitchen... Take one tablet daily.    Januvia 100 Mg Tabs (Sitagliptin phosphate) .Marland Kitchen... Take one tablet daily.  Problem # 6:  COPD (ICD-496)  His updated medication list for this problem includes:    Symbicort 160-4.5 Mcg/act Aero (Budesonide-formoterol fumarate) .Marland Kitchen... 2 puffs two times a day    Spiriva Handihaler 18 Mcg Caps (Tiotropium bromide monohydrate) ..... Use one inhalation daily.    Xopenex Hfa 45 Mcg/act Aero (Levalbuterol tartrate) .Marland Kitchen..Marland Kitchen Two puffs qid as needed  Problem # 7:  HYPERTENSION (ICD-401.9) May need to adjust dose of Coreg for better control.  See in early follow up again. His updated medication list for this problem includes:    Adult Aspirin Ec Low Strength 81 Mg Tbec (Aspirin) ..... One tablet daily.    Coreg 12.5 Mg Tabs (Carvedilol) .Marland Kitchen... Take 1/2 tablet two times a day    Lasix 40 Mg Tabs (Furosemide) .Marland Kitchen... Take one tabet daily.  Roberto [name] Instructions: 1)  Your physician recommends that you schedule a follow-up appointment in: 2 WEEKS 2)  Your physician recommends that you continue on your current medications as directed. Please refer to the Current Medication list given to you today.

## 2010-06-14 NOTE — Assessment & Plan Note (Signed)
Summary: EPH   Referring Provider:  [name], [name] Primary Provider:  Corrin Parker   History of Present Illness: Overall the patient is doing well.  He was admitted with recurrence of atrial fibrillation and chest pain.  Dr. Graciela Husbands saw the patient, stopped the dofetilide, and decided to start Amiodarone despite his bradycardia.  He saw Dr. Ladona Ridgel in followup, who thought he reduced Amiodarone to 200mg  per day, but in actual fact cut it from 800mg  to 400mg .  Patient is doing pretty well, does have cough.  Denies any further chest pain.  Current Medications (verified): 1)  Symbicort 160-4.5 Mcg/act  Aero (Budesonide-Formoterol Fumarate) .... 2 Puffs Two Times A Day 2)  Spiriva Handihaler 18 Mcg  Caps (Tiotropium Bromide Monohydrate) .... Use One Inhalation Daily. 3)  Xopenex Hfa 45 Mcg/act Aero (Levalbuterol Tartrate) .... Two Puffs Qid As Needed 4)  Nasonex 50 Mcg/act  Susp (Mometasone Furoate) .... Two Puffs Each Nostril Daily 5)  Zyrtec Allergy 10 Mg Tabs (Cetirizine Hcl) .... Take 1 Tablet By Mouth Once A Day 6)  Adult Aspirin Ec Low Strength 81 Mg  Tbec (Aspirin) .... One Tablet Daily. 7)  Coreg 12.5 Mg  Tabs (Carvedilol) .... Take 1/2 Tablet Two Times A Day 8)  Nitro-Dur 0.4 Mg/hr  Pt24 (Nitroglycerin) .... Place Under Tongue As Needed For Chest Pain. 9)  Lasix 40 Mg  Tabs (Furosemide) .... Take One Tabet Daily. 10)  Actos 45 Mg  Tabs (Pioglitazone Hcl) .... Take One Tablet Daily. 11)  Januvia 100 Mg  Tabs (Sitagliptin Phosphate) .... Take One Tablet Daily. 12)  Nexium 40 Mg  Cpdr (Esomeprazole Magnesium) .... Take One Capsule By Mouth Daily 13)  Lithium Carbonate 300 Mg  Caps (Lithium Carbonate) .... Take One Capsule Two Times A Day 14)  Avodart 0.5 Mg Caps (Dutasteride) .... Take 1 Tablet By Mouth Once A Day 15)  Warfarin Sodium 5 Mg Tabs (Warfarin Sodium) .... Take As Directed Per Coumadin Clinic 16)  Mucinex .... As Needed 17)  Amiodarone Hcl 400 Mg Tabs (Amiodarone  Hcl) .... One By Mouth Once Daily 18)  Potassium Chloride Cr 10 Meq Cr-Caps (Potassium Chloride) .... Take One Tablet By Mouth Daily  Allergies (verified): 1)  ! Morphine 2)  ! Lipitor 3)  ! * Ambien 4)  ! Prednisone  Vital Signs:  Patient profile:   75 year old male Height:      71 inches Weight:      182 pounds BMI:     25.48 Pulse rate:   60 / minute Resp:     16 per minute BP sitting:   158 / 85  (right arm)  Vitals Entered By: Marrion Coy, CNA (May 15, 2009 4:30 PM)  Physical Exam  General:  Well developed, well nourished, in no acute distress. Lungs:  Decrease breath sounds, mild ronchii, no rales on exam noted. Heart:  Apical murmur.  Normal S1 and S2.   Abdomen:  No masses.  Cannot appreciate aneurysm on physical examination.   EKG  Procedure date:  05/15/2009  Findings:      NSR.  LAD.  Nonspecific ST and T abnormality.  Left axis deviation.   ICD Specifications Following MD:  Lewayne Bunting, MD     ICD Vendor:  [employer]     ICD Model Number:  7232     ICD Serial Number:  [id number] ICD DOI:  08/28/2004     ICD Implanting MD:  Lewayne Bunting, MD  Lead 1:  Location: RV     DOI: 08/28/2004     Model #: 4098     Serial #: [id number]     Status: active Lead 2:    Location: RV     DOI: 01/17/2009     Model #: 5621     Serial #: [id number]     Status: active  Indications::  ICM   ICD Follow Up ICD Dependent:  No      Episodes Coumadin:  Yes  Brady Parameters Mode VVI     Lower Rate Limit:  55      Tachy Zones VF:  200     VT:  250     VT1:  182     Impression & Recommendations:  Problem # 1:  ATRIAL FIBRILLATION (ICD-427.31) Seen by Dr. Graciela Husbands, and started on Amiodarone.  No recurrence since discharge.  Medications risks and benefits reveiwed with patient and wife. His updated medication list for this problem includes:    Adult Aspirin Ec Low Strength 81 Mg Tbec (Aspirin) ..... One tablet daily.    Coreg 12.5 Mg Tabs (Carvedilol) .Marland Kitchen... Take 1/2  tablet two times a day    Warfarin Sodium 5 Mg Tabs (Warfarin sodium) .Marland Kitchen... Take as directed per coumadin clinic    Amiodarone Hcl 200 Mg Tabs (Amiodarone hcl) .Marland Kitchen... Take one tablet by mouth daily  Orders: EKG w/ Interpretation (93000)  Problem # 2:  PULMONARY NODULE (ICD-518.89) Will need follow up CT in 6-8 months.  Problem # 3:  ABDOMINAL AORTIC ANEURYSM (ICD-441.4) Dr. Arbie Cookey saw in hospital and will see again in the office in January.  Problem # 4:  ISCHEMIC CARDIOMYOPATHY (ICD-414.8) Holding his own at present. His updated medication list for this problem includes:    Adult Aspirin Ec Low Strength 81 Mg Tbec (Aspirin) ..... One tablet daily.    Coreg 12.5 Mg Tabs (Carvedilol) .Marland Kitchen... Take 1/2 tablet two times a day    Nitro-dur 0.4 Mg/hr Pt24 (Nitroglycerin) .Marland Kitchen... Place under tongue as needed for chest pain.    Lasix 40 Mg Tabs (Furosemide) .Marland Kitchen... Take one tabet daily.    Warfarin Sodium 5 Mg Tabs (Warfarin sodium) .Marland Kitchen... Take as directed per coumadin clinic    Amiodarone Hcl 200 Mg Tabs (Amiodarone hcl) .Marland Kitchen... Take one tablet by mouth daily  Problem # 5:  COPD (ICD-496) Will need close monitoring, with pulm nodule, and Amiodarone. His updated medication list for this problem includes:    Symbicort 160-4.5 Mcg/act Aero (Budesonide-formoterol fumarate) .Marland Kitchen... 2 puffs two times a day    Spiriva Handihaler 18 Mcg Caps (Tiotropium bromide monohydrate) ..... Use one inhalation daily.    Xopenex Hfa 45 Mcg/act Aero (Levalbuterol tartrate) .Marland Kitchen..Marland Kitchen Two puffs qid as needed  Patient Instructions: 1)  Your physician recommends that you schedule a follow-up appointment in: 3 WEEKS 2)  Your physician has recommended you make the following change in your medication: DECREASE Amiodarone to 200mg  once a day

## 2010-06-14 NOTE — Assessment & Plan Note (Signed)
Summary: congested/ mbw   Copy to:  [name], [name] Primary Provider/Referring Provider:  Corrin Parker  CC:  COPD. The patient c/o cough that is productive and non-productive at times. He also c/o some sob and wheezing for 2-3 weeks.Roberto [name]  History of Present Illness: 75 yo with known history of COPD and hoarseness.  He was started on amiodarone after Thanksgiving for his A. fib.  His heart rate has been normal since.  He feels his cough have been getting worse.  He has been getting a rattle in his throat, and this is worse when he is laying down to sleep.  He sometimes has to sit up to sleep.  He was tried on a Zpak, mucinex, and delsym recently.  This helped some, but did not comletely alleviate his symptoms.  Preventive Screening-Counseling & Management  Alcohol-Tobacco     Smoking Status: quit     Pack years: l ppd x 40 years  Current Medications (verified): 1)  Symbicort 160-4.5 Mcg/act  Aero (Budesonide-Formoterol Fumarate) .... 2 Puffs Two Times A Day 2)  Spiriva Handihaler 18 Mcg  Caps (Tiotropium Bromide Monohydrate) .... Use One Inhalation Daily. 3)  Xopenex Hfa 45 Mcg/act Aero (Levalbuterol Tartrate) .... Two Puffs Qid As Needed 4)  Nasonex 50 Mcg/act  Susp (Mometasone Furoate) .... Two Puffs Each Nostril Daily 5)  Zyrtec Allergy 10 Mg Tabs (Cetirizine Hcl) .... Take 1 Tablet By Mouth Once A Day 6)  Adult Aspirin Ec Low Strength 81 Mg  Tbec (Aspirin) .... One Tablet Daily. 7)  Coreg 12.5 Mg  Tabs (Carvedilol) .... Take 1/2 Tablet Two Times A Day 8)  Nitro-Dur 0.4 Mg/hr  Pt24 (Nitroglycerin) .... Place Under Tongue As Needed For Chest Pain. 9)  Lasix 40 Mg  Tabs (Furosemide) .... Take One Tabet Daily. 10)  Actos 45 Mg  Tabs (Pioglitazone Hcl) .... Take One Tablet Daily. 11)  Januvia 100 Mg  Tabs (Sitagliptin Phosphate) .... Take One Tablet Daily. 12)  Nexium 40 Mg  Cpdr (Esomeprazole Magnesium) .... Take One Capsule By Mouth Daily 13)  Lithium Carbonate 300 Mg   Caps (Lithium Carbonate) .... Take One Capsule Two Times A Day 14)  Avodart 0.5 Mg Caps (Dutasteride) .... Take 1 Tablet By Mouth Once A Day 15)  Warfarin Sodium 5 Mg Tabs (Warfarin Sodium) .... Take As Directed Per Coumadin Clinic 16)  Mucinex .... As Needed 17)  Amiodarone Hcl 200 Mg Tabs (Amiodarone Hcl) .... Take One Tablet By Mouth Daily 18)  Potassium Chloride Cr 10 Meq Cr-Caps (Potassium Chloride) .... Take One Tablet By Mouth Daily  Allergies (verified): 1)  ! Morphine 2)  ! Lipitor 3)  ! * Ambien 4)  ! Prednisone  Past History:  Past Medical History: Reviewed history from 05/01/2009 and no changes required. CAD (ICD-414.00)- post coronary artery bypass graft surgery with prior percutaneos intervention of the saphenous vein graft with known saphenous vein graft disease. Abdominal aortic aneurysm      - 5.8 cm abd u/s Nov 2010 Bilateral popliteal artery aneurysm CAD s/p MI Ischemic cardiomyopathy with EF 40% Paroxysmal atrial fibrillation HTN Peripheral vascular disease Dyslipidemia Diabetes mellitus GERD Hiatal hernia Diverticular disease BPH COPD      - 05/09/08 PFT FEV1 1.84 (55%), FVC 4.42 (95%), TLC 6.46 (92%), DLCO 72%, no BD response Pneumonia January, 2009 Allergic rhinitis Shingles  Past Surgical History: Reviewed history from 02/15/2009 and no changes required. Insertion of a new rate sensing pacing lead with  removal of a previous implanted  ICD and insertion of device back in the pocket with defibrillation threshold testing.   Partial colectomy 1995 CABG 1996 Aorto-bifemoral bypass 1999 by [name] Early implantable cardio defibrillator August 28, 2004 by [name] stents placed to the saphenous vein graft and also atrial fibrillation. radiofrequency ablation in 2006. Nasal surgery  Vital Signs:  Patient profile:   75 year old male Height:      71 inches (180.34 cm) Weight:      185.38 pounds (84.26 kg) BMI:     25.95 O2 Sat:      94 %  on Room air Temp:     97.3 degrees F (36.28 degrees C) oral Pulse rate:   61 / minute BP sitting:   122 / 76  (left arm) Cuff size:   regular  Vitals Entered By: Michel Bickers CMA (June 01, 2009 2:04 PM)  O2 Sat at Rest %:  94 O2 Flow:  Room air  Physical Exam  General:  normal appearance.   Nose:  clear nasal discharge.   Mouth:  no deformity or lesions Neck:  no JVD.   Lungs:  decreased breath sounds, no wheezing or rales Heart:  regular rate and rhythm, S1, S2 without murmurs, rubs, gallops, or clicks Abdomen:  soft, nontender Extremities:  No edema Cervical Nodes:  no significant adenopathy   Impression & Recommendations:  Problem # 1:  COPD (ICD-496) Most of his current symptoms seem to be related to his upper airway.  His COPD is stable, and I will continue him on his current inhaler regimen.  Problem # 2:  ALLERGIC RHINITIS (ICD-477.9) Advised him to be more consistant with his sinus regimen.  Problem # 3:  SNORING (ICD-786.09) Much of his symptoms seem to have a nocturnal component.  I am still concerned that he may have sleep apnea.  He was not able to arrange for the home sleep screening test.  Given his history of cardiovascular disease I think he needs an in-lab polysomnogram to further assess whether he has sleep disordered breathing contributing to his current symptoms  Problem # 4:  PULMONARY NODULE (ICD-518.89) He will need a follow up non-contrast CT chest in June 2011.  Problem # 5:  HOARSENESS (EAV-409.81) Have advised him to try salt water gargles, and sugarless candy to maintain salivary flow.  Problem # 6:  ATRIAL FIBRILLATION (ICD-427.31) He is to follow up with cardiology to determine if he needs to remain on amiodarone.  Again his current symtoms seem related to upper airway irritation, and I don't think this is related to his amiodarone use.  If he is to continue on amiodarone, then I have advised we would need to monitor his respiratory function  periodically.  So far he appears to have tolerated the use of amiodarone well, and think it is reasonable to continue if deemed appropriate by cardiology.  Complete Medication List: 1)  Symbicort 160-4.5 Mcg/act Aero (Budesonide-formoterol fumarate) .... 2 puffs two times a day 2)  Spiriva Handihaler 18 Mcg Caps (Tiotropium bromide monohydrate) .... Use one inhalation daily. 3)  Xopenex Hfa 45 Mcg/act Aero (Levalbuterol tartrate) .... Two puffs qid as needed 4)  Nasonex 50 Mcg/act Susp (Mometasone furoate) .... Two puffs each nostril daily 5)  Zyrtec Allergy 10 Mg Tabs (Cetirizine hcl) .... Take 1 tablet by mouth once a day 6)  Adult Aspirin Ec Low Strength 81 Mg Tbec (Aspirin) .... One tablet daily. 7)  Coreg 12.5 Mg Tabs (Carvedilol) .... Take 1/2 tablet two  times a day 8)  Nitro-dur 0.4 Mg/hr Pt24 (Nitroglycerin) .... Place under tongue as needed for chest pain. 9)  Lasix 40 Mg Tabs (Furosemide) .... Take one tabet daily. 10)  Actos 45 Mg Tabs (Pioglitazone hcl) .... Take one tablet daily. 11)  Januvia 100 Mg Tabs (Sitagliptin phosphate) .... Take one tablet daily. 12)  Nexium 40 Mg Cpdr (Esomeprazole magnesium) .... Take one capsule by mouth daily 13)  Lithium Carbonate 300 Mg Caps (Lithium carbonate) .... Take one capsule two times a day 14)  Avodart 0.5 Mg Caps (Dutasteride) .... Take 1 tablet by mouth once a day 15)  Warfarin Sodium 5 Mg Tabs (Warfarin sodium) .... Take as directed per coumadin clinic 16)  Mucinex  .... As needed 17)  Amiodarone Hcl 200 Mg Tabs (Amiodarone hcl) .... Take one tablet by mouth daily 18)  Potassium Chloride Cr 10 Meq Cr-caps (Potassium chloride) .... Take one tablet by mouth daily  Other Orders: Est. Patient Level III (27253) Radiology Referral (Radiology) Sleep Disorder Referral (Sleep Disorder)  Patient Instructions: 1)  Try mucinex or delsym for cough 2)  Salt water gargle as needed 3)  Will schedule sleep test 4)  Follow up in 3 months

## 2010-06-14 NOTE — Medication Information (Signed)
Summary: rov/ewj  Anticoagulant Therapy  Managed by: Weston Brass, PharmD Referring MD: [name] MD PCP: Corrin Parker Supervising MD: Daleen Squibb MD, Maisie Fus Indication 1: Atrial Fibrillation (ICD-427.31) Lab Used: [facility] Site: [facility] INR POC 1.8 INR RANGE 2 - 3  Dietary changes: no    Health status changes: no    Bleeding/hemorrhagic complications: no    Recent/future hospitalizations: no    Any changes in medication regimen? no    Recent/future dental: no  Any missed doses?: no       Is patient compliant with meds? yes       Allergies: 1)  ! Morphine 2)  ! Lipitor 3)  ! * Ambien 4)  ! Prednisone  Anticoagulation Management History:      The patient is taking warfarin and comes in today for a routine follow up visit.  Positive risk factors for bleeding include an age of 73 years or older and presence of serious comorbidities.  The bleeding index is 'intermediate risk'.  Positive CHADS2 values include History of CHF, History of HTN, and History of Diabetes.  Negative CHADS2 values include Age > 61 years old.  The start date was 05/29/2007.  His last INR was 8.4 ratio.  Anticoagulation responsible provider: Daleen Squibb MD, Maisie Fus.  INR POC: 1.8.  Cuvette Lot#: [id number].  Exp: 02/2011.    Anticoagulation Management Assessment/Plan:      The patient's current anticoagulation dose is Warfarin sodium 5 mg tabs: take as directed per coumadin clinic.  The target INR is 2 - 3.  The next INR is due 12/15/2009.  Anticoagulation instructions were given to patient.  Results were reviewed/authorized by Weston Brass, PharmD.  He was notified by Dillard Cannon.         Prior Anticoagulation Instructions: INR 1.7  Take 1 tablet today then resume same dosage 1/2 tablet daily except 1 tablet on Tuesdays.  Recheck in 2 weeks.   Current Anticoagulation Instructions: INR 1.8  Take 1 tab today and then increase to 1/2 tab daily except for 1 tab on Tuesday and Friday.  Re-check in 2  weeks.

## 2010-06-14 NOTE — Miscellaneous (Signed)
Summary: auto BPAP download 08/29/09 to 09/04/09  Clinical Lists Changes Used on 6 of 7 nights with average 5hrs 56 min.  Optimal pressure 11/7 cm H2O.  Average AHI 6.  Results d/w pt over the phone.  He is continuing to have trouble adjusting to the machine.  Will discuss further at his next ROV 10/02/09.

## 2010-06-14 NOTE — Assessment & Plan Note (Signed)
Summary: established patient visit/bfw   Vital Signs:  Patient profile:   75 year old male Height:      70.5 inches (179.07 cm) Weight:      173.50 pounds (78.86 kg) O2 Sat:      97 % on Room air Temp:     97.9 degrees F (36.61 degrees C) oral Pulse rate:   62 / minute BP sitting:   142 / 74  (right arm) Cuff size:   regular  Vitals Entered By: Josph Macho RMA (April 16, 2010 9:12 AM)  O2 Flow:  Room air  History of Present Illness: 75 y/o WM, here to establish care. Acute: URI/cough x 10-14d, without fevers, chest pain, or SOB.  Getting better the last 4-5 days since being put on azithromycin 500mg  daily by pulmonologist Dr. Vassie Loll (CXR neg for infiltrate 04/11/10).  No systemic steroids.  Pt tells me prednisone makes him feel "sick".  Chronic problems are extensive, but main ones are cardiovascular and pulmonary.   Has ischemic cardiomyopathy and history of a-fib, plus he is s/p CABG and multiple aortic aneurism surgeries.  Currently stable with medical management.  Says last INR was 2.3 at [facility] (about a week ago?).   DM 2: checks glucose 2-3 times per week fasting--110-140 range per his report today.  Was on actos plus januvia in the past but this was changed to just [place] due to glucoses being borderline low. His TSH has been a bit off lately due to addition of amiodarone, so some small adjustments to his dosing of synthroid has been ongoing. Dr. Dagoberto Ligas has been managing his lithium---pt says he's been on current dosing for quite a while.   Current Problems (verified): 1)  Renal Insufficiency  (ICD-588.9) 2)  Aneurysm, Thoracic Aortic  (ICD-441.2) 3)  Pulmonary Nodule  (ICD-518.89) 4)  Cad, Artery Bypass Graft  (ICD-414.04) 5)  Hoarseness  (ICD-784.42) 6)  Abdominal Aortic Aneurysm  (ICD-441.4) 7)  Atrial Fibrillation  (ICD-427.31) 8)  Peripheral Vascular Disease  (ICD-443.9) 9)  Implantation of Defibrillator, Hx of  (ICD-V45.02) 10)  Ischemic  Cardiomyopathy  (ICD-414.8) 11)  Diabetes Mellitus, Type II  (ICD-250.00) 12)  Left Ventricular Failure  (ICD-428.1) 13)  Hyperlipidemia  (ICD-272.4) 14)  Hx of Myocardial Infarction  (ICD-410.90) 15)  Hypertension  (ICD-401.9) 16)  COPD  (ICD-496) 17)  Allergic Rhinitis  (ICD-477.9)  Current Medications (verified): 1)  Symbicort 160-4.5 Mcg/act  Aero (Budesonide-Formoterol Fumarate) .... 2 Puffs Two Times A Day 2)  Spiriva Handihaler 18 Mcg  Caps (Tiotropium Bromide Monohydrate) .... Use One Inhalation Daily. 3)  Nasonex 50 Mcg/act  Susp (Mometasone Furoate) .... Two Puffs Each Nostril Daily 4)  Zyrtec Allergy 10 Mg Tabs (Cetirizine Hcl) .... Take 1 Tablet By Mouth Once A Day 5)  Adult Aspirin Ec Low Strength 81 Mg  Tbec (Aspirin) .... One Tablet Daily. 6)  Coreg 12.5 Mg  Tabs (Carvedilol) .... Take 1/2 Tablet Two Times A Day 7)  Nitro-Dur 0.4 Mg/hr  Pt24 (Nitroglycerin) .... Place Under Tongue As Needed For Chest Pain. 8)  Lasix 40 Mg  Tabs (Furosemide) .... Take 2 Tablet By Mouth Once Daily 9)  Januvia 100 Mg  Tabs (Sitagliptin Phosphate) .... Take One Tablet Daily. 10)  Lithium Carbonate 300 Mg  Caps (Lithium Carbonate) .... Take One Capsule Two Times A Day 11)  Finasteride 5 Mg Tabs (Finasteride) .Marland Kitchen.. 1 By Mouth Daily 12)  Warfarin Sodium 5 Mg Tabs (Warfarin Sodium) .... Take As Directed Per  [facility] Clinic 13)  Amiodarone Hcl 200 Mg Tabs (Amiodarone Hcl) .... Take 1 Tablet By Mouth Once A Day 14)  Potassium Chloride Cr 10 Meq Cr-Caps (Potassium Chloride) .... Take One Tablet By Mouth Daily 15)  Guaifenesin Dac 30-10-100 Mg/63ml Soln (Pseudoephedrine-Codeine-Gg) .... As Needed 16)  Acetaminophen 325 Mg  Tabs (Acetaminophen) .... As Needed 17)  Robitussin Dm 100-10 Mg/48ml Syrp (Dextromethorphan-Guaifenesin) .... As Needed For Cough 18)  Synthroid 125 Mcg Tabs (Levothyroxine Sodium) .... Take 1 Tablet By Mouth Once A Day 19)  Flomax 0.4 Mg Caps (Tamsulosin Hcl) .... Take 1 Tablet By  Mouth Once A Day 20)  Xopenex Hfa 45 Mcg/act Aero (Levalbuterol Tartrate) .... Inhale 2 Puffs Four Times A Day As Needed 21)  Lotrisone 1-0.05 % Crea (Clotrimazole-Betamethasone) .... 2 X A Day 22)  Topicort 0.25 % Crea (Desoximetasone) .... As Needed 23)  Benicar 20 Mg Tabs (Olmesartan Medoxomil) .... Take One-Half Tablet By Mouth Daily 24)  Isosorbide Mononitrate Cr 30 Mg Xr24h-Tab (Isosorbide Mononitrate) .... Take One Tablet By Mouth Once Daily 25)  Cyanocobalamin 1000 Mcg/ml Soln (Cyanocobalamin) .... Once Monthly  Allergies (verified): 1)  ! Morphine 2)  ! Lipitor 3)  ! * Ambien 4)  ! Prednisone  Past History:  Past Medical History: Last updated: 10/02/2009 CAD (ICD-414.00)- post coronary artery bypass graft surgery with prior percutaneos intervention of the saphenous vein graft with known saphenous vein graft disease. Abdominal aortic aneurysm      - 5.8 cm abd u/s Nov 2010 Descending thoracic aorta aneurysm Bilateral popliteal artery aneurysm CAD s/p MI Ischemic cardiomyopathy with EF 40% Paroxysmal atrial fibrillation HTN Peripheral vascular disease Dyslipidemia Diabetes mellitus GERD Hiatal hernia Diverticular disease BPH COPD      - 05/09/08 PFT FEV1 1.84 (55%), FVC 4.42 (95%), TLC 6.46 (92%), DLCO 72%, no BD response Pneumonia January, 2009 Allergic rhinitis Shingles OSA      - PSG 06/14/09 RDI 17, PLMI 96      - Intolerant of CPAP or BPAP  Past Surgical History: Last updated: 02/15/2009 Insertion of a new rate sensing pacing lead with  removal of a previous implanted ICD and insertion of device back in the pocket with defibrillation threshold testing.   Partial colectomy 1995 CABG 1996 Aorto-bifemoral bypass 1999 by [name] Early implantable cardio defibrillator August 28, 2004 by [name] stents placed to the saphenous vein graft and also atrial fibrillation. radiofrequency ablation in 2006. Nasal surgery  Family History: Last updated:  11-Sep-2008  Mother died in her 23s of CVA.  Father died at 46 of   lung cancer, MI in his 35s.  He has 5 siblings, 2 of which has CAD.   Social History: Last updated: 12/27/2008  Lives in [place] with his wife.  He is a retired   Curator.  He has an 80-pack-year history of tobacco abuse, quitting 2005  He rarely has a beer.  He denies drug use.  He plays golf   twice a week, but rides the cart to the ball, hits it, and then gets   back in the cart.      Review of Systems  The patient denies chest pain and peripheral edema.    Physical Exam  General:  VS: noted, all normal. Gen: Alert, well appearing, oriented x 4.  TMs without erythema.  Nose is clear.  Throat: no erythema, exudate, or swelling. Neck: supple.  No lymphadenopathy, thyromegaly, mass, or bruit. CV: RRR, no murmur/rub/gallop LUNGS: very mild coarse rhonchi with  forced expiration.  Good aeration.  Nonlabored resps. Abd: soft, NT, ND, BS normal.  No HSM or mass or bruit.   EXT: no edema.  Femoral pulses 2+ bilat, with trace bruit on left.   Impression & Recommendations:  Problem # 1:  ISCHEMIC CARDIOMYOPATHY (ICD-414.8) Currently stable, not currently in any rehab program.  Continue medical management and cardiology f/u. He'll continue his current PT/INR monitoring via [facility] but will likely switch to our lab when it is available in 1-2 months.  Problem # 2:  DIABETES MELLITUS, TYPE II (ICD-250.00) Continue current med and monitoring, obtain old records to see what labs may be due.  His updated medication list for this problem includes:    Adult Aspirin Ec Low Strength 81 Mg Tbec (Aspirin) ..... One tablet daily.    Januvia 100 Mg Tabs (Sitagliptin phosphate) .Marland Kitchen... Take one tablet daily.    Benicar 20 Mg Tabs (Olmesartan medoxomil) .Marland Kitchen... Take one-half tablet by mouth daily  Problem # 3:  COPD (ICD-496) Stable.  Continue current regimen and specialist care. His recent respiratory infection is  clearing up nicely.  Complete Medication List: 1)  Symbicort 160-4.5 Mcg/act Aero (Budesonide-formoterol fumarate) .... 2 puffs two times a day 2)  Spiriva Handihaler 18 Mcg Caps (Tiotropium bromide monohydrate) .... Use one inhalation daily. 3)  Nasonex 50 Mcg/act Susp (Mometasone furoate) .... Two puffs each nostril daily 4)  Zyrtec Allergy 10 Mg Tabs (Cetirizine hcl) .... Take 1 tablet by mouth once a day 5)  Adult Aspirin Ec Low Strength 81 Mg Tbec (Aspirin) .... One tablet daily. 6)  Coreg 12.5 Mg Tabs (Carvedilol) .... Take 1/2 tablet two times a day 7)  Nitro-dur 0.4 Mg/hr Pt24 (Nitroglycerin) .... Place under tongue as needed for chest pain. 8)  Lasix 40 Mg Tabs (Furosemide) .... Take 2 tablet by mouth once daily 9)  Januvia 100 Mg Tabs (Sitagliptin phosphate) .... Take one tablet daily. 10)  Lithium Carbonate 300 Mg Caps (Lithium carbonate) .... Take one capsule two times a day 11)  Finasteride 5 Mg Tabs (Finasteride) .Marland Kitchen.. 1 by mouth daily 12)  Warfarin Sodium 5 Mg Tabs (Warfarin sodium) .... Take as directed per coumadin clinic 13)  Amiodarone Hcl 200 Mg Tabs (Amiodarone hcl) .... Take 1 tablet by mouth once a day 14)  Potassium Chloride Cr 10 Meq Cr-caps (Potassium chloride) .... Take one tablet by mouth daily 15)  Guaifenesin Dac 30-10-100 Mg/80ml Soln (Pseudoephedrine-codeine-gg) .... As needed 16)  Acetaminophen 325 Mg Tabs (Acetaminophen) .... As needed 17)  Robitussin Dm 100-10 Mg/53ml Syrp (Dextromethorphan-guaifenesin) .... As needed for cough 18)  Synthroid 125 Mcg Tabs (Levothyroxine sodium) .... Take 1 tablet by mouth once a day 19)  Flomax 0.4 Mg Caps (Tamsulosin hcl) .... Take 1 tablet by mouth once a day 20)  Xopenex Hfa 45 Mcg/act Aero (Levalbuterol tartrate) .... Inhale 2 puffs four times a day as needed 21)  Lotrisone 1-0.05 % Crea (Clotrimazole-betamethasone) .... 2 x a day 22)  Topicort 0.25 % Crea (Desoximetasone) .... As needed 23)  Benicar 20 Mg Tabs (Olmesartan  medoxomil) .... Take one-half tablet by mouth daily 24)  Isosorbide Mononitrate Cr 30 Mg Xr24h-tab (Isosorbide mononitrate) .... Take one tablet by mouth once daily 25)  Cyanocobalamin 1000 Mcg/ml Soln (Cyanocobalamin) .... Once monthly  Patient Instructions: 1)  Please schedule a follow-up appointment in 2 months.  2)  Sign consent to obtain records from Dr. Dagoberto Ligas and Urologist ([name]).   Orders Added: 1)  New  Patient Level III [16109]

## 2010-06-14 NOTE — Medication Information (Signed)
Summary: rovmp  Anticoagulant Therapy  Managed by: Bethena Midget, RN, BSN Referring MD: [name] MD PCP: Corrin Parker Supervising MD: Daleen Squibb MD, Maisie Fus Indication 1: Atrial Fibrillation (ICD-427.31) Lab Used: [facility]  Site: [facility] INR POC 3.6 INR RANGE 2 - 3  Dietary changes: yes       Details: poor appetite due to pneumonia  Health status changes: yes       Details: Dx with Pneumonia  Bleeding/hemorrhagic complications: no    Recent/future hospitalizations: no    Any changes in medication regimen? yes       Details: Started Prednisone tapering dose pak finished on Sunday and also Avelox daily (9day supply) finished today.    Recent/future dental: no  Any missed doses?: no       Is patient compliant with meds? yes       Allergies: 1)  ! Morphine 2)  ! Lipitor 3)  ! * Ambien 4)  ! Prednisone  Anticoagulation Management History:      The patient is taking warfarin and comes in today for a routine follow up visit.  Positive risk factors for bleeding include an age of 73 years or older and presence of serious comorbidities.  The bleeding index is 'intermediate risk'.  Positive CHADS2 values include History of CHF, History of HTN, and History of Diabetes.  Negative CHADS2 values include Age > 86 years old.  The start date was 05/29/2007.  His last INR was 8.4 ratio.  Anticoagulation responsible provider: Daleen Squibb MD, Maisie Fus.  INR POC: 3.6.  Cuvette Lot#: [id number].  Exp: 09/2010.    Anticoagulation Management Assessment/Plan:      The patient's current anticoagulation dose is Warfarin sodium 5 mg tabs: take as directed per coumadin clinic.  The target INR is 2 - 3.  The next INR is due 08/17/2009.  Anticoagulation instructions were given to patient.  Results were reviewed/authorized by Bethena Midget, RN, BSN.  He was notified by Bethena Midget, RN, BSN.         Prior Anticoagulation Instructions: INR 2.2  Continue 0.5 tab daily except 1 tab each Tuesday.  Recheck in 4  weeks.    Current Anticoagulation Instructions: INR 3.6 Skip today's dose then resume 2.5mg s everyday except 5mg s on Tuesdays. Recheck in 4 weeks.

## 2010-06-14 NOTE — Progress Notes (Signed)
Summary: meds/thyroid  Phone Note Call from Patient Call back at Home Phone [phone]   Caller: Spouse Reason for Call: Talk to Nurse Summary of Call: new med(amiodarone) have affected his thyroid, now on synthroid Initial call taken by: Migdalia Dk,  Sep 29, 2009 12:58 PM  Follow-up for Phone Call        I spoke with the pt and he has been started on Synthroid daily by Dr Dagoberto Ligas.  The pt is also interested in discussing Pradaxa with Dr Riley Kill at his upcoming appt.  I asked the pt to contact his insurance company and see is this medication is covered under his drug plan. Follow-up by: Julieta Gutting, RN, BSN,  Sep 29, 2009 2:04 PM    New/Updated Medications: SYNTHROID 50 MCG TABS (LEVOTHYROXINE SODIUM) take one daily

## 2010-06-14 NOTE — Cardiovascular Report (Signed)
Summary: Health Management Program Alert Report STAT   Health Management Program Alert Report STAT   Imported By: Roderic Ovens 07/06/2009 16:39:15  _____________________________________________________________________  External Attachment:    Type:   Image     Comment:   External Document

## 2010-06-14 NOTE — Assessment & Plan Note (Signed)
Summary: ROV   Visit Type:  Follow-up Referring Lanore Renderos:  [name], [name], Dr. Arbie Cookey Primary Bakari Nikolai:  Corrin Parker  CC:  Indigestion.  History of Present Illness: Roberto [name] is a 75 yo male with CAD, s/p CABG in 1996, s/p NSTEMI in June 2011 with cath revealing occlusion of the SVG-CFX treated medically, chronic systolic congestive heart failure, iIschemic cardiomyopathy with an EF of 20-25% by echo in 10/2009, atrial flutter status post radiofrequency catheter ablation and a history of paroxysmal atrial fibrillation, currently on amiodarone and Coumadin therapy.  His PMH also includes PAD status post lower extremity bypass, abdominal aortic aneurysm status post repair, descending thoracic aortic aneurysm, followed by [facility], history of lung nodules, hypertension, hyperlipidemia, diabetes, COPD.  He was last seen in August.  His lasix was restarted then due to increased weight.  Roberto [name] notes that since his MI in June, he has been very short of breath.  He notes NYHA Class 3 symptoms.  He denies chest pain.  He notes some symptoms of orthopnea, but states he only sleeps on one pillow.  He denies PND.  No significant edema.  He is very fatigued.    Current Medications (verified): 1)  Symbicort 160-4.5 Mcg/act  Aero (Budesonide-Formoterol Fumarate) .... 2 Puffs Two Times A Day 2)  Spiriva Handihaler 18 Mcg  Caps (Tiotropium Bromide Monohydrate) .... Use One Inhalation Daily. 3)  Nasonex 50 Mcg/act  Susp (Mometasone Furoate) .... Two Puffs Each Nostril Daily 4)  Zyrtec Allergy 10 Mg Tabs (Cetirizine Hcl) .... Take 1 Tablet By Mouth Once A Day 5)  Adult Aspirin Ec Low Strength 81 Mg  Tbec (Aspirin) .... One Tablet Daily. 6)  Coreg 12.5 Mg  Tabs (Carvedilol) .... Take 1/2 Tablet Two Times A Day 7)  Nitro-Dur 0.4 Mg/hr  Pt24 (Nitroglycerin) .... Place Under Tongue As Needed For Chest Pain. 8)  Lasix 40 Mg  Tabs (Furosemide) .... Take 2 Tablet By  Mouth Once Daily 9)  Januvia 100 Mg  Tabs (Sitagliptin Phosphate) .... Take One Tablet Daily. 10)  Lithium Carbonate 300 Mg  Caps (Lithium Carbonate) .... Take One Capsule Two Times A Day 11)  Finasteride 5 Mg Tabs (Finasteride) .Marland Kitchen.. 1 By Mouth Daily 12)  Warfarin Sodium 5 Mg Tabs (Warfarin Sodium) .... Take As Directed Per [facility]mad[facility] Clinic 13)  Amiodarone Hcl 200 Mg Tabs (Amiodarone Hcl) .... Take 1 Tablet By Mouth Once A Day 14)  Potassium Chloride Cr 10 Meq Cr-Caps (Potassium Chloride) .... Take One Tablet By Mouth Daily 15)  Guaifenesin Dac 30-10-100 Mg/55ml Soln (Pseudoephedrine-Codeine-Gg) .... As Needed 16)  Acetaminophen 325 Mg  Tabs (Acetaminophen) .... As Needed 17)  Robitussin Dm 100-10 Mg/25ml Syrp (Dextromethorphan-Guaifenesin) .... As Needed For Cough 18)  Synthroid 112 Mcg Tabs (Levothyroxine Sodium) .... Take 1 Tablet By Mouth Once A Day 19)  Flomax 0.4 Mg Caps (Tamsulosin Hcl) .... Take 1 Tablet By Mouth Once A Day 20)  Xopenex Hfa 45 Mcg/act Aero (Levalbuterol Tartrate) .... Inhale 2 Puffs Four Times A Day As Needed 21)  Lotrisone 1-0.05 % Crea (Clotrimazole-Betamethasone) .... 2 X A Day 22)  Topicort 0.25 % Crea (Desoximetasone) .... As Needed  Allergies: 1)  ! Morphine 2)  ! Lipitor 3)  ! * Ambien 4)  ! Prednisone  Past History:  Past Medical History: Last updated: 10/02/2009 CAD (ICD-414.00)- post coronary artery bypass graft surgery with prior percutaneos intervention of the saphenous vein graft with known saphenous vein graft disease. Abdominal aortic  aneurysm      - 5.8 cm abd u/s Nov 2010 Descending thoracic aorta aneurysm Bilateral popliteal artery aneurysm CAD s/p MI Ischemic cardiomyopathy with EF 40% Paroxysmal atrial fibrillation HTN Peripheral vascular disease Dyslipidemia Diabetes mellitus GERD Hiatal hernia Diverticular disease BPH COPD      - 05/09/08 PFT FEV1 1.84 (55%), FVC 4.42 (95%), TLC 6.46 (92%), DLCO 72%, no BD response Pneumonia  January, 2009 Allergic rhinitis Shingles OSA      - PSG 06/14/09 RDI 17, PLMI 96      - Intolerant of CPAP or BPAP  Past Surgical History: Last updated: 02/15/2009 Insertion of a new rate sensing pacing lead with  removal of a previous implanted ICD and insertion of device back in the pocket with defibrillation threshold testing.   Partial colectomy 1995 CABG 1996 Aorto-bifemoral bypass 1999 by [name] Early implantable cardio defibrillator August 28, 2004 by [name] stents placed to the saphenous vein graft and also atrial fibrillation. radiofrequency ablation in 2006. Nasal surgery  Review of Systems GI:  Complains of indigestion/heartburn.  Vital Signs:  Patient profile:   75 year old male Height:      72 inches Weight:      173.50 pounds BMI:     23.62 Pulse rate:   55 / minute Pulse rhythm:   regular Resp:     18 per minute BP sitting:   140 / 76  (left arm) Cuff size:   large  Vitals Entered By: Vikki Ports (March 14, 2010 4:23 PM)  Physical Exam  General:  Well nourished, well developed, in no acute distress HEENT: normal Neck: no JVD appreciated Cardiac:  normal S1, S2; RRR; 2/6 systolic murmur at  apex Lungs:  clear to auscultation bilaterally with faint crackles at the bases Abd: soft, nontender, no hepatomegaly Ext: no edema Vascular: no carotid  bruits Skin: warm and dry Neuro:  CNs 2-12 intact, nonfocal   EKG  Procedure date:  03/14/2010  Findings:      Underlying NSR V paced at HR 56    ICD Specifications Following MD:  Lewayne Bunting, MD     Referring MD:  [facility] ICD Vendor:  [employer]     ICD Model Number:  7232     ICD Serial Number:  [id number] ICD DOI:  08/28/2004     ICD Implanting MD:  Lewayne Bunting, MD  Lead 1:    Location: RV     DOI: 08/28/2004     Model #: [id number]     Serial #: [id number]     Status: active Lead 2:    Location: RV     DOI: 01/17/2009     Model #: [id number]     Serial #: [id number]     Status:  active  Indications::  ICM   ICD Follow Up ICD Dependent:  No      Episodes Coumadin:  Yes  Brady Parameters Mode VVI     Lower Rate Limit:  55      Tachy Zones VF:  200     VT:  250 FVT VIA VF     VT1:  182     Impression & Recommendations:  Problem # 1:  CAD, ARTERY BYPASS GRAFT (ICD-414.04)  ? if his indigestion is angina add nitrate  Orders: EKG w/ Interpretation (93000)  Problem # 2:  ATRIAL FIBRILLATION (ICD-427.31) maintaining NSR  Problem # 3:  IMPLANTATION OF DEFIBRILLATOR, HX OF (ICD-V45.02)  Problem # 4:  ISCHEMIC  CARDIOMYOPATHY (ICD-414.8)  He has NYHA class 3 symptoms.  EF previously 40% and now 25%. Adjust Rx for CHF Add ARB (had cough with ACEI) . . candesartan 8 mg once daily Check BMP and BNP in one week. If BNP up, increase lasix.  Problem # 5:  HYPERTENSION (ICD-401.9) Borderline today. BP should tolerate med adjustments.  Problem # 6:  RENAL INSUFFICIENCY (ICD-588.9) Keep close eye on Creatinine with med adjustments.  Problem # 7:  ANEURYSM, THORACIC AORTIC (ICD-441.2) Measurements are larger on recent CT scan. Report to be sent to Dr. Arbie Cookey by pulmonologist per patient.  Encouraged him to followup with Dr. Arbie Cookey.  Patient Instructions: 1)  Your physician recommends that you schedule a follow-up appointment in: 1 MONTH 2)  Your physician recommends that you return for lab work in: 1 WEEK (BMP, BNP 414.04, 427.31, 414.8, 401.9)--03/22/10, lab hours 8:30-2:00 and 2:30-4:45 3)  Your physician has recommended you make the following change in your medication: START Atacand 8mg  once a day, START Isosorbide  MN 30mg  once a day 4)  Please call Dr Arbie Cookey for an appt due to CT results. Prescriptions: ISOSORBIDE MONONITRATE CR 30 MG XR24H-TAB (ISOSORBIDE MONONITRATE) take one tablet by mouth once daily  #30 x 6   Entered by:   Julieta Gutting, RN, BSN   Authorized by:   Ronaldo Miyamoto, MD, [facility]   Signed by:   Julieta Gutting, RN, BSN on 03/14/2010    Method used:   Electronically to        [facility]  [address] [phone]* (retail)       [address]       [place], [place]  [address]       Ph: [phone] or [phone]       Fax: [phone]   RxID:   [id number] ATACAND 8 MG TABS (CANDESARTAN CILEXETIL) take one tablet by mouth once daily  #30 x 6   Entered by:   Julieta Gutting, RN, BSN   Authorized by:   Ronaldo Miyamoto, MD, [facility]   Signed by:   Julieta Gutting, RN, BSN on 03/14/2010   Method used:   Electronically to        [facility]  [address] [phone]* (retail)       [address]       [place], [place]  [address]       Ph: [phone] or [phone]       Fax: [phone]   RxID:   [id number]

## 2010-06-14 NOTE — Procedures (Signed)
Summary: ROV   Current Medications (verified): 1)  Symbicort 160-4.5 Mcg/act  Aero (Budesonide-Formoterol Fumarate) .... 2 Puffs Two Times A Day 2)  Spiriva Handihaler 18 Mcg  Caps (Tiotropium Bromide Monohydrate) .... Use One Inhalation Daily. 3)  Xopenex Hfa 45 Mcg/act Aero (Levalbuterol Tartrate) .... Two Puffs Qid As Needed 4)  Nasonex 50 Mcg/act  Susp (Mometasone Furoate) .... Two Puffs Each Nostril Daily 5)  Zyrtec Allergy 10 Mg Tabs (Cetirizine Hcl) .... Take 1 Tablet By Mouth Once A Day 6)  Adult Aspirin Ec Low Strength 81 Mg  Tbec (Aspirin) .... One Tablet Daily. 7)  Coreg 12.5 Mg  Tabs (Carvedilol) .... Take 1/2 Tablet Two Times A Day 8)  Nitro-Dur 0.4 Mg/hr  Pt24 (Nitroglycerin) .... Place Under Tongue As Needed For Chest Pain. 9)  Lasix 40 Mg  Tabs (Furosemide) .... Take One Tabet Daily. 10)  Actos 45 Mg  Tabs (Pioglitazone Hcl) .... Take One Tablet Daily. 11)  Januvia 100 Mg  Tabs (Sitagliptin Phosphate) .... Take One Tablet Daily. 12)  Nexium 40 Mg  Cpdr (Esomeprazole Magnesium) .... Take One Capsule By Mouth Daily 13)  Lithium Carbonate 300 Mg  Caps (Lithium Carbonate) .... Take One Capsule Two Times A Day 14)  Finasteride 5 Mg Tabs (Finasteride) .Marland Kitchen.. 1 By Mouth Daily 15)  Warfarin Sodium 5 Mg Tabs (Warfarin Sodium) .... Take As Directed Per Coumadin Clinic 16)  Mucinex .... As Needed 17)  Amiodarone Hcl 200 Mg Tabs (Amiodarone Hcl) .... Take 1 Tablet By Mouth Once A Day 18)  Potassium Chloride Cr 10 Meq Cr-Caps (Potassium Chloride) .... Take One Tablet By Mouth Daily 19)  Guaifenesin Dac 30-10-100 Mg/74ml Soln (Pseudoephedrine-Codeine-Gg) .... As Needed 20)  Acetaminophen 325 Mg  Tabs (Acetaminophen) .... As Needed 21)  Robitussin Dm 100-10 Mg/43ml Syrp (Dextromethorphan-Guaifenesin) .... As Needed For Cough  Allergies (verified): 1)  ! Morphine 2)  ! Lipitor 3)  ! * Ambien 4)  ! Prednisone   ICD Specifications Following MD:  Lewayne Bunting, MD     ICD Vendor:  [employer]      ICD Model Number:  7232     ICD Serial Number:  [id number] ICD DOI:  08/28/2004     ICD Implanting MD:  Lewayne Bunting, MD  Lead 1:    Location: RV     DOI: 08/28/2004     Model #: 4098     Serial #: [id number]     Status: active Lead 2:    Location: RV     DOI: 01/17/2009     Model #: 5621     Serial #: [id number]     Status: active  Indications::  ICM   ICD Follow Up Remote Check?  No Battery Voltage:  3.04 V     Charge Time:  8.20 seconds     ICD Dependent:  No       ICD Device Measurements Right Ventricle:  Amplitude: 10.8 mV, Impedance: 448 ohms, Threshold: 1.0 V at 0.3 msec Shock Impedance: 45/67 ohms   Episodes Coumadin:  Yes Shock:  0     ATP:  0     Nonsustained:  0     Ventricular Pacing:  41.6%  Brady Parameters Mode VVI     Lower Rate Limit:  55      Tachy Zones VF:  200     VT:  250     VT1:  182     Next Cardiology Appt Due:  10/11/2009 Tech Comments:  No  parameter changes.  [phone] lead with rate sense lead.  The patient's activity level is down, he is being treated for pneumonia. ROV 3 months in the clinic. Altha Harm, LPN  July 20, 2009 2:46 PM  MD Comments:  Agree with above.

## 2010-06-14 NOTE — Medication Information (Signed)
Summary: rov/sp  Anticoagulant Therapy  Managed by: Bethena Midget, RN, BSN Referring MD: [name] MD PCP: Michell Heinrich M.D. Supervising MD: Shirlee Latch MD, Lashawne Dura Indication 1: Atrial Fibrillation (ICD-427.31) Lab Used: [facility] Site: [facility] INR POC 4.2 INR RANGE 2 - 3  Dietary changes: yes       Details: Poor appetite  Health status changes: yes       Details: Has cold, Bronchitis and COPD per pt.   Bleeding/hemorrhagic complications: no    Recent/future hospitalizations: no    Any changes in medication regimen? yes       Details: Was on Z-pk, another ABX and received inj in office but he states he completed all new meds a week ago  Recent/future dental: no  Any missed doses?: no       Is patient compliant with meds? yes       Allergies: 1)  ! Morphine 2)  ! Lipitor 3)  ! * Ambien 4)  ! Prednisone  Anticoagulation Management History:      The patient is taking warfarin and comes in today for a routine follow up visit.  Positive risk factors for bleeding include an age of 73 years or older and presence of serious comorbidities.  The bleeding index is 'intermediate risk'.  Positive CHADS2 values include History of CHF, History of HTN, and History of Diabetes.  Negative CHADS2 values include Age > 39 years old.  The start date was 05/29/2007.  His last INR was 8.4 ratio.  Anticoagulation responsible provider: Shirlee Latch MD, Jazzmyne Rasnick.  INR POC: 4.2.  Cuvette Lot#: [id number].  Exp: 05/2011.    Anticoagulation Management Assessment/Plan:      The patient's current anticoagulation dose is Warfarin sodium 5 mg tabs: take as directed per coumadin clinic.  The target INR is 2 - 3.  The next INR is due 06/15/2010.  Anticoagulation instructions were given to patient.  Results were reviewed/authorized by Bethena Midget, RN, BSN.  He was notified by Bethena Midget, RN, BSN.         Prior Anticoagulation Instructions: INR 3.1  Skip today's dose of Coumadin then resume same dose  of 1/2 tablet every day except 1 tablet on Tuesday and Friday.  Recheck INR in 4 weeks.   Current Anticoagulation Instructions: INR 4.2 Skip today's dose then change dose to 1/2 pill everyday except 1 pill on Fridays. Recheck in 2 weeks.

## 2010-06-14 NOTE — Letter (Addendum)
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  He has been known to me for many years with   follow-up of this.  He underwent open infrarenal abdominal aortic   aneurysm repair by myself in 1999.  He prior had coronary artery bypass   grafting in 1996.  He did have a history of a type 2 thoracic dissection   in 1995.  He has had known prolonged ectasia of his thoracic aneurysm   and this has been followed with serial ultrasounds.  Most recently he   underwent repeat CT scan to follow-up a pulmonary nodule in October 2011   and this revealed increasing size of his aneurysm at his diaphragm.   Maximal diameter is now 6.0, up from 5.7 in April 2011.  He continues to   be in quite frail health.  He did have another myocardial infarction in   June 2011.  Cath at that time revealed occlusion of several of his prior   bypass grafts,  chronic systolic congestive heart failure, ischemic   cardiomyopathy with a an ejection fraction of 20% to 25% by echo in June   2011.  He has chronic atrial flutter and does have a defibrillator   placed.  He does report generalized diminished stamina since his   myocardial infarction in June and has more shortness of breath.  He does   have to sleep on a pillow.  He is on chronic Coumadin therapy.      PAST HISTORY:  Otherwise unchanged.      PHYSICAL EXAMINATION:  A well-developed white male appearing stated age   of 23.  He is in no respiratory distress currently.  His blood  pressure   is 100/61, heart rate 62, respirations 18.  He is in no acute distress.   HEENT:  Normal.  Chest:  Clear bilaterally.  Abdomen:  Well-healed   midline incision.  I do not feel an aneurysm.  He does have palpable   femoral pulses.  Musculoskeletal:  Shows no major point or cyanosis.  He   has no peripheral edema.  Neurologic:  Grossly intact.  Skin:  Without   ulcers or rashes.      I reviewed his scan and his prior scans as well.  This does show slow   continued growth in size.  His aneurysm begins at the level of his arch   where it is approximately 4 cm and it has multiple lobulations but the   maximal diameter at the diaphragm is 6.0.  This does extend down just to   the takeoff of the celiac axis.  This caliber is near normal at the  level of the superior mesenteric artery and renal arteries.      I had a long discussion with the patient and his wife.  I  explained   that with the 6 cm aneurysm with slow continued growth,  his predicted   risk of rupture would be approximately 10% per year.  I feel that his   risk for surgery far exceeds with his chronic failure.  I feel that he   is in no way a candidate for open surgical repair and would be very   borderline repair of stent graft due to the takeoff at the level of the   celiac axis.  I did discuss this by telephone with Dr. Bonnee Quin as   well.  We will plan to see him again in 6 months with CT scan to follow   up progression of his enlargement.  He understands the symptoms of   leaking aneurysm and knows to report immediately to the emergency room   should this occur.            Larina Earthly, M.D.   Electronically Signed      TFE/MEDQ  D:  04/03/2010  T:  04/04/2010  Job:  [id number]      cc:   [name]. Riley Kill, MD, [facility]   Alfonse Alpers. Dagoberto Ligas, M.D.

## 2010-06-14 NOTE — Procedures (Signed)
Summary: DEVICE CHECK PT HAS APPT WITH [facility] AT 3:30 ALSO   Allergies: 1)  ! Morphine 2)  ! Lipitor 3)  ! * Ambien 4)  ! Prednisone   ICD Specifications Following MD:  Lewayne Bunting, MD     Referring MD:  [facility] ICD Vendor:  [employer]     ICD Model Number:  7232     ICD Serial Number:  [id number] ICD DOI:  08/28/2004     ICD Implanting MD:  Lewayne Bunting, MD  Lead 1:    Location: RV     DOI: 08/28/2004     Model #: 6295     Serial #: [id number]     Status: active Lead 2:    Location: RV     DOI: 01/17/2009     Model #: 1027     Serial #: [id number]     Status: active  Indications::  ICM   ICD Follow Up Remote Check?  No Battery Voltage:  3.04 V     Charge Time:  8.20 seconds     Underlying rhythm:  SR ICD Dependent:  No       ICD Device Measurements Right Ventricle:  Amplitude: 9.1 mV, Impedance: 464 ohms, Threshold: 1.0 V at 0.2 msec Shock Impedance: 45/64 ohms   Episodes Coumadin:  Yes Shock:  0     ATP:  0     Nonsustained:  0     Ventricular Pacing:  <1%  Brady Parameters Mode VVI     Lower Rate Limit:  55      Tachy Zones VF:  200     VT:  250 FVT VIA VF     VT1:  182     Next Cardiology Appt Due:  10/11/2009 Tech Comments:  Normal device function.  No changes made today.  Pt with [id number] and [id number] P/S lead because of previous fracture.  ROV 3 months clinic. Gypsy Balsam RN BSN  August 09, 2009 4:13 PM  MD Comments:  Agree with above.

## 2010-06-14 NOTE — Miscellaneous (Signed)
Summary: [facility] Cardiac Physician Order/Treatment Plan   [facility] Cardiac Physician Order/Treatment Plan   Imported By: Roderic Ovens 11/21/2009 15:22:38  _____________________________________________________________________  External Attachment:    Type:   Image     Comment:   External Document

## 2010-06-14 NOTE — Cardiovascular Report (Signed)
Summary: Health Management Program Status Report   Health Management Program Status Report   Imported By: Roderic Ovens 11/10/2009 10:32:35  _____________________________________________________________________  External Attachment:    Type:   Image     Comment:   External Document

## 2010-06-14 NOTE — Cardiovascular Report (Signed)
Summary: Health Management Program Summary Report   Health Management Program Summary Report   Imported By: Roderic Ovens 11/03/2009 11:21:36  _____________________________________________________________________  External Attachment:    Type:   Image     Comment:   External Document

## 2010-06-14 NOTE — Progress Notes (Signed)
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  The dermatologist office called and they did not get all of this removed.  They have scheduled the pt for another procedure on November 14th.  She was wondering what to do about the pt's coumadin.  I told her that per our notes Dr Dagoberto Ligas had instructed the pt to hold coumadin 3 days prior to previous procedure.  I told her that she would need to speak with Dr Jorja Loa about further instructions regarding coumadin prior to procedure.  If she would like clearance from Dr Riley Kill about holding coumadin she will call the office back.  She also wanted to make [place] aware that the pt is having a tooth pulled today and did not hold coumadin. Follow-up by: Julieta Gutting, RN, BSN,  February 14, 2010 10:43 AM

## 2010-06-14 NOTE — Medication Information (Signed)
Summary: rov/sp  Anticoagulant Therapy  Managed by: Reina Fuse, PharmD Referring MD: [name] MD PCP: Corrin Parker Supervising MD: Clifton James MD, Cristal Deer Indication 1: Atrial Fibrillation (ICD-427.31) Lab Used: [facility] Site: [facility] INR POC 2.7 INR RANGE 2 - 3  Dietary changes: no    Health status changes: no    Bleeding/hemorrhagic complications: no    Recent/future hospitalizations: no    Any changes in medication regimen? no    Recent/future dental: no  Any missed doses?: no       Is patient compliant with meds? yes       Current Medications (verified): 1)  Symbicort 160-4.5 Mcg/act  Aero (Budesonide-Formoterol Fumarate) .... 2 Puffs Two Times A Day 2)  Spiriva Handihaler 18 Mcg  Caps (Tiotropium Bromide Monohydrate) .... Use One Inhalation Daily. 3)  Xopenex Hfa 45 Mcg/act Aero (Levalbuterol Tartrate) .... Two Puffs Qid As Needed 4)  Nasonex 50 Mcg/act  Susp (Mometasone Furoate) .... Two Puffs Each Nostril Daily 5)  Zyrtec Allergy 10 Mg Tabs (Cetirizine Hcl) .... Take 1 Tablet By Mouth Once A Day 6)  Adult Aspirin Ec Low Strength 81 Mg  Tbec (Aspirin) .... One Tablet Daily. 7)  Coreg 12.5 Mg  Tabs (Carvedilol) .... Take 1/2 Tablet Two Times A Day 8)  Nitro-Dur 0.4 Mg/hr  Pt24 (Nitroglycerin) .... Place Under Tongue As Needed For Chest Pain. 9)  Lasix 40 Mg  Tabs (Furosemide) .... Take 1 Tablet By Mouth Two Times A Day 10)  Januvia 100 Mg  Tabs (Sitagliptin Phosphate) .... Take One Tablet Daily. 11)  Lithium Carbonate 300 Mg  Caps (Lithium Carbonate) .... Take One Capsule Two Times A Day 12)  Finasteride 5 Mg Tabs (Finasteride) .Marland Kitchen.. 1 By Mouth Daily 13)  Warfarin Sodium 5 Mg Tabs (Warfarin Sodium) .... Take As Directed Per Coumadin Clinic 14)  Mucinex .... As Needed 15)  Amiodarone Hcl 200 Mg Tabs (Amiodarone Hcl) .... Take 1 Tablet By Mouth Once A Day 16)  Potassium Chloride Cr 10 Meq Cr-Caps (Potassium Chloride) .... Take One Tablet By Mouth  Daily 17)  Guaifenesin Dac 30-10-100 Mg/41ml Soln (Pseudoephedrine-Codeine-Gg) .... As Needed 18)  Acetaminophen 325 Mg  Tabs (Acetaminophen) .... As Needed 19)  Robitussin Dm 100-10 Mg/9ml Syrp (Dextromethorphan-Guaifenesin) .... As Needed For Cough 20)  Synthroid 125 Mcg Tabs (Levothyroxine Sodium) .... Take 1 Tablet By Mouth Once A Day 21)  Flomax 0.4 Mg Caps (Tamsulosin Hcl) .... Take 1 Tablet By Mouth Once A Day X 2 Weeks 22)  Lotrisone 1-0.05 % Crea (Clotrimazole-Betamethasone) .... Two Times A Day 23)  Topicort 0.25 % Crea (Desoximetasone) .... Two Times A Day  Allergies (verified): 1)  ! Morphine 2)  ! Lipitor 3)  ! * Ambien 4)  ! Prednisone  Anticoagulation Management History:      The patient is taking warfarin and comes in today for a routine follow up visit.  Positive risk factors for bleeding include an age of 22 years or older and presence of serious comorbidities.  The bleeding index is 'intermediate risk'.  Positive CHADS2 values include History of CHF, History of HTN, and History of Diabetes.  Negative CHADS2 values include Age > 46 years old.  The start date was 05/29/2007.  His last INR was 8.4 ratio.  Anticoagulation responsible provider: Clifton James MD, Cristal Deer.  INR POC: 2.7.  Cuvette Lot#: [id number].  Exp: 02/2011.    Anticoagulation Management Assessment/Plan:      The patient's current anticoagulation dose is Warfarin sodium 5 mg  tabs: take as directed per coumadin clinic.  The target INR is 2 - 3.  The next INR is due 01/04/2010.  Anticoagulation instructions were given to patient.  Results were reviewed/authorized by Reina Fuse, PharmD.  He was notified by Reina Fuse PharmD.         Prior Anticoagulation Instructions: INR 1.8  Take 1 tab today and then increase to 1/2 tab daily except for 1 tab on Tuesday and Friday.  Re-check in 2 weeks.  Current Anticoagulation Instructions: INR 2.7  Continue Coumadin 0.5 tab (2.5 mg) all days except Coumadin 1 tab (5 mg) on  Tues and Fri. Return to clinic in 3 weeks.

## 2010-06-14 NOTE — Medication Information (Signed)
Summary: Tax adviser   Imported By: Lehman Prom 01/17/2010 14:10:23  _____________________________________________________________________  External Attachment:    Type:   Image     Comment:   External Document

## 2010-06-14 NOTE — Letter (Signed)
Summary: Handout Printed  Printed Handout:  - Coumadin Instructions-w/out Meds 

## 2010-06-14 NOTE — Cardiovascular Report (Signed)
Summary: Health Management Program Summary Report   Health Management Program Summary Report   Imported By: Roderic Ovens 11/08/2009 10:53:41  _____________________________________________________________________  External Attachment:    Type:   Image     Comment:   External Document

## 2010-06-14 NOTE — Progress Notes (Signed)
Summary: Lab results  Phone Note Other Incoming   Summary of Call: Please call and notify: all labs recently were good except his blood count was a tiny bit low.  Please add on a ferritin, iron, TIBC: dx is anemia NOS.  Continue all meds as currently prescribed.   Initial call taken by: Michell Heinrich M.D.,  May 21, 2010 10:25 AM  Follow-up for Phone Call        Lab add-on request faxed to lab. Francee Piccolo CMA Duncan Dull)  May 21, 2010 10:32 AM  I spoke to [place] in the lab and they are unable to do add-on labs due to age of specimen.  Do you want pt  to be redrawn? Follow-up by: Francee Piccolo CMA Duncan Dull),  May 22, 2010 3:52 PM  Additional Follow-up for Phone Call Additional follow up Details #1::        Yes, have him go back for ferritin, iron, TIBC, and can we ask the lab to do an extra SST tube to hold? Also have him do hemoccult cards x 3.  I just want to make sure he doesn't have any signs of GI bleeding.     Additional Follow-up by: Michell Heinrich M.D.,  May 22, 2010 4:11 PM    Additional Follow-up for Phone Call Additional follow up Details #2::    Pt is doing better.  He has one more day of antibiotic and is using the cough syrup.  Pt is not able to sleep at night.  He can only lay down for 1-2 hours then he has to get back up.  Pt still sounds hoarse on phone and had difficulty talking for extended amounts of time. Notified pt of lab results and he will come back for labs when he feels better.  Also advised pt we are still waiting for the instrument to arrive to remove his finger nail. Follow-up by: Francee Piccolo CMA Duncan Dull),  May 23, 2010 12:16 PM

## 2010-06-14 NOTE — Progress Notes (Signed)
  12,LOV faxed to [facility] @ [phone] [facility]  March 15, 2010 11:03 AM

## 2010-06-14 NOTE — Cardiovascular Report (Signed)
Summary: Health Management Program Alert Report STAT  Health Management Program Alert Report STAT   Imported By: Roderic Ovens 11/01/2009 15:10:12  _____________________________________________________________________  External Attachment:    Type:   Image     Comment:   External Document

## 2010-06-14 NOTE — Progress Notes (Signed)
Summary: c/p   Phone Note Call from Patient Call back at Home Phone [phone]   Caller: Spouse Reason for Call: Talk to Nurse Details for Reason: pt at fire station now with c/o cp, b/p fine, pt in rhythm, pt didn't feel fine.  Initial call taken by: Lorne Skeens,  June 06, 2009 11:28 AM  Follow-up for Phone Call        The pt is calling from the fire station today stating that he developed some CP in his mid to left chest after he got out of the shower today about 9am. He questions if this might be indigestion. He has not eaten any different foods. He has belched a couple of times with some relief. The pt states he has had a history of MI and CABG. He states his symptoms are only a little similar to his MI. He states he has gotten up and moved very little so he doesn't know if his symptoms get worse with movement. He has no other symptoms. His bp at the fire station is 148/90 HR- 68 NSR. Per the pt, the fire dept says the EKG doesn't show anything. He has a little pain now 2/10. At its worse, the pt pain has been 4/10 today. I will discuss with Dr. Excell Seltzer (DOD) and call the pt back at [phone]. Follow-up by: Sherri Rad, RN, BSN,  June 06, 2009 11:38 AM  Additional Follow-up for Phone Call Additional follow up Details #1::        I discussed the above with Dr. Excell Seltzer (DOD)- he recommends that the pt go to the ER. I have notified the pt's wife of this. She states that the the EMT's have been notified and are there trying to convince the pt to go to the hosptial. I explained per Dr. Excell Seltzer he needs to go and be evaluated. The pt's wife is agreeable. I have notified Trish that the pt will be coming (should be by EMS). Additional Follow-up by: Sherri Rad, RN, BSN,  June 06, 2009 12:09 PM

## 2010-06-14 NOTE — Cardiovascular Report (Signed)
Summary: Health Management Program Status Report  Health Management Program Status Report   Imported By: Roderic Ovens 07/06/2009 16:38:28  _____________________________________________________________________  External Attachment:    Type:   Image     Comment:   External Document

## 2010-06-14 NOTE — Progress Notes (Signed)
Summary: Change to Benicar  Phone Note From Pharmacy   Caller: [facility]  [phone] Summary of Call: pt's insurance will not pay for atacand, but will pay for losartan, benicar, or diovan, can he substitute? Initial call taken by: Glynda Jaeger,  March 15, 2010 10:08 AM  Follow-up for Phone Call        I spoke with Dr Riley Kill and made him aware that Atacand is not covered by the pt's insurance.  Per Dr Riley Kill have the pt start Benicar 10mg  once a day. We may titrate this med in the future.  Pt aware of new medication and Rx instructions.  Follow-up by: Julieta Gutting, RN, BSN,  March 15, 2010 12:51 PM    New/Updated Medications: BENICAR 20 MG TABS (OLMESARTAN MEDOXOMIL) Take one-half tablet by mouth daily Prescriptions: BENICAR 20 MG TABS (OLMESARTAN MEDOXOMIL) Take one-half tablet by mouth daily  #30 x 2   Entered by:   Julieta Gutting, RN, BSN   Authorized by:   Ronaldo Miyamoto, MD, [facility]   Signed by:   Julieta Gutting, RN, BSN on 03/15/2010   Method used:   [facility] to        [facility]  [address] [phone]* (retail)       [address]       [place], [place]  [address]       Ph: [phone] or [phone]       Fax: [phone]   RxID:   [id number]

## 2010-06-14 NOTE — Medication Information (Signed)
Summary: Tax adviser   Imported By: Kassie Mends 07/04/2009 10:18:52  _____________________________________________________________________  External Attachment:    Type:   Image     Comment:   External Document

## 2010-06-14 NOTE — Assessment & Plan Note (Signed)
Summary: 2 months/apc   Copy to:  [name], [name], Dr. Arbie Cookey Primary Provider/Referring Provider:  Corrin Parker  CC:  2 month follow up.  Pt had Ct Chest done yesterday.  Still having increased SOB with little activity.  Wheezing at times.  Denies cough and chest tightness.  Marland Kitchen  History of Present Illness: 75 yo with known history of COPD, hoarseness, pulmonary nodule and OSA intolerant of CPAP.  His breathing has been about the same.  He still gets some wheeze.  He gets winded easily with activity.  He is not bringing up much sputum,  and is not having much wheeze.  He denies fever, and his sinuses are okay.  He has been sleeping okay.     CT of Chest  Procedure date:  03/01/2010  Findings:      CT CHEST WITHOUT CONTRAST   Technique:  Multidetector CT imaging of the chest was performed following the standard protocol without IV contrast.  High resolution images were also obtained.   Comparison: Chest x-ray dated 11/09/2009 and chest CT scan dated 08/11/2009   Findings: The vague area of nodularity in the anterior basal aspect of the left lung has completely resolved.  There is minimal scarring at both lung bases.  There are no acute infiltrates or effusions.  Heart size is normal.   The patient has multiple areas of aneurysmal dilatation of the thoracic aorta as previously noted.  Diameter of the top of the aortic arch is stable at 4.1 cm.  Diameter of the descending thoracic aorta on image number 35 of series 2 is 5.4 cm, increased from 5.0 cm.  Diameter at the level of the diaphragm is 6.0 cm, increased from 5.7 cm.   IMPRESSION:   1.  Clearing of the nodular area of density in the left lung base. 2.  Slight increase in the aneurysmal dilatation of the thoracic aorta. 3.  Stable slight scarring at the lung bases.   Current Medications (verified): 1)  Symbicort 160-4.5 Mcg/act  Aero (Budesonide-Formoterol Fumarate) .... 2 Puffs Two Times A Day 2)   Spiriva Handihaler 18 Mcg  Caps (Tiotropium Bromide Monohydrate) .... Use One Inhalation Daily. 3)  Nasonex 50 Mcg/act  Susp (Mometasone Furoate) .... Two Puffs Each Nostril Daily 4)  Zyrtec Allergy 10 Mg Tabs (Cetirizine Hcl) .... Take 1 Tablet By Mouth Once A Day 5)  Adult Aspirin Ec Low Strength 81 Mg  Tbec (Aspirin) .... One Tablet Daily. 6)  Coreg 12.5 Mg  Tabs (Carvedilol) .... Take 1/2 Tablet Two Times A Day 7)  Nitro-Dur 0.4 Mg/hr  Pt24 (Nitroglycerin) .... Place Under Tongue As Needed For Chest Pain. 8)  Lasix 40 Mg  Tabs (Furosemide) .... Take 2 Tablet By Mouth Once Daily 9)  Januvia 100 Mg  Tabs (Sitagliptin Phosphate) .... Take One Tablet Daily. 10)  Lithium Carbonate 300 Mg  Caps (Lithium Carbonate) .... Take One Capsule Two Times A Day 11)  Finasteride 5 Mg Tabs (Finasteride) .Marland Kitchen.. 1 By Mouth Daily 12)  Warfarin Sodium 5 Mg Tabs (Warfarin Sodium) .... Take As Directed Per [facility] 13)  Amiodarone Hcl 200 Mg Tabs (Amiodarone Hcl) .... Take 1 Tablet By Mouth Once A Day 14)  Potassium Chloride Cr 10 Meq Cr-Caps (Potassium Chloride) .... Take One Tablet By Mouth Daily 15)  Guaifenesin Dac 30-10-100 Mg/3ml Soln (Pseudoephedrine-Codeine-Gg) .... As Needed 16)  Acetaminophen 325 Mg  Tabs (Acetaminophen) .... As Needed 17)  Robitussin Dm 100-10 Mg/86ml Syrp (Dextromethorphan-Guaifenesin) .... As Needed  For Cough 18)  Synthroid 112 Mcg Tabs (Levothyroxine Sodium) .... Take 1 Tablet By Mouth Once A Day 19)  Flomax 0.4 Mg Caps (Tamsulosin Hcl) .... Take 1 Tablet By Mouth Once A Day 20)  Xopenex Hfa 45 Mcg/act Aero (Levalbuterol Tartrate) .... Inhale 2 Puffs Four Times A Day As Needed  Allergies (verified): 1)  ! Morphine 2)  ! Lipitor 3)  ! * Ambien 4)  ! Prednisone  Past History:  Past Medical History: Last updated: 10/02/2009 CAD (ICD-414.00)- post coronary artery bypass graft surgery with prior percutaneos intervention of the saphenous vein graft with known saphenous vein  graft disease. Abdominal aortic aneurysm      - 5.8 cm abd u/s Nov 2010 Descending thoracic aorta aneurysm Bilateral popliteal artery aneurysm CAD s/p MI Ischemic cardiomyopathy with EF 40% Paroxysmal atrial fibrillation HTN Peripheral vascular disease Dyslipidemia Diabetes mellitus GERD Hiatal hernia Diverticular disease BPH COPD      - 05/09/08 PFT FEV1 1.84 (55%), FVC 4.42 (95%), TLC 6.46 (92%), DLCO 72%, no BD response Pneumonia January, 2009 Allergic rhinitis Shingles OSA      - PSG 06/14/09 RDI 17, PLMI 96      - Intolerant of CPAP or BPAP  Past Surgical History: Last updated: 02/15/2009 Insertion of a new rate sensing pacing lead with  removal of a previous implanted ICD and insertion of device back in the pocket with defibrillation threshold testing.   Partial colectomy 1995 CABG 1996 Aorto-bifemoral bypass 1999 by [name] Early implantable cardio defibrillator August 28, 2004 by [name] stents placed to the saphenous vein graft and also atrial fibrillation. radiofrequency ablation in 2006. Nasal surgery  Vital Signs:  Patient profile:   75 year old male Height:      72 inches Weight:      171 pounds BMI:     23.28 O2 Sat:      96 % on Room air Temp:     97.7 degrees F oral Pulse rate:   62 / minute BP sitting:   132 / 78  (right arm) Cuff size:   regular  Vitals Entered By: Gweneth Dimitri RN (March 02, 2010 3:06 PM)  O2 Flow:  Room air CC: 2 month follow up.  Pt had Ct Chest done yesterday.  Still having increased SOB with little activity.  Wheezing at times.  Denies cough and chest tightness.   Comments Medications reviewed with patient Daytime contact number verified with patient. Gweneth Dimitri RN  March 02, 2010 3:08 PM    Physical Exam  General:  thin.   Nose:  clear nasal discharge.   Mouth:  no deformity or lesions Neck:  no JVD.   Lungs:  decreased breath sounds, no wheezing or rales Heart:  regular rate and rhythm, S1, S2  without murmurs, rubs, gallops, or clicks Extremities:  No edema Neurologic:  normal CN II-XII and strength normal.   Cervical Nodes:  no significant adenopathy   Impression & Recommendations:  Problem # 1:  COPD (ICD-496) He is to continue his current inhaler regimen.  Problem # 2:  ALLERGIC RHINITIS (ICD-477.9) He is to continue his current sinus regimen.  Problem # 3:  PULMONARY NODULE (ICD-518.89) Resolved on recent CT chest.  Problem # 4:  ANEURYSM, THORACIC AORTIC (ICD-441.2) I have faxed copy of CT chest report to Dr. Bosie Helper office.  It doesn't seem like there are going to be any good options for this.  Medications Added to Medication List This Visit: 1)  Lasix 40 Mg Tabs (Furosemide) .... Take 2 tablet by mouth once daily 2)  Synthroid 112 Mcg Tabs (Levothyroxine sodium) .... Take 1 tablet by mouth once a day  Complete Medication List: 1)  Symbicort 160-4.5 Mcg/act Aero (Budesonide-formoterol fumarate) .... 2 puffs two times a day 2)  Spiriva Handihaler 18 Mcg Caps (Tiotropium bromide monohydrate) .... Use one inhalation daily. 3)  Nasonex 50 Mcg/act Susp (Mometasone furoate) .... Two puffs each nostril daily 4)  Zyrtec Allergy 10 Mg Tabs (Cetirizine hcl) .... Take 1 tablet by mouth once a day 5)  Adult Aspirin Ec Low Strength 81 Mg Tbec (Aspirin) .... One tablet daily. 6)  Coreg 12.5 Mg Tabs (Carvedilol) .... Take 1/2 tablet two times a day 7)  Nitro-dur 0.4 Mg/hr Pt24 (Nitroglycerin) .... Place under tongue as needed for chest pain. 8)  Lasix 40 Mg Tabs (Furosemide) .... Take 2 tablet by mouth once daily 9)  Januvia 100 Mg Tabs (Sitagliptin phosphate) .... Take one tablet daily. 10)  Lithium Carbonate 300 Mg Caps (Lithium carbonate) .... Take one capsule two times a day 11)  Finasteride 5 Mg Tabs (Finasteride) .Marland Kitchen.. 1 by mouth daily 12)  Warfarin Sodium 5 Mg Tabs (Warfarin sodium) .... Take as directed per coumadin clinic 13)  Amiodarone Hcl 200 Mg Tabs (Amiodarone hcl)  .... Take 1 tablet by mouth once a day 14)  Potassium Chloride Cr 10 Meq Cr-caps (Potassium chloride) .... Take one tablet by mouth daily 15)  Guaifenesin Dac 30-10-100 Mg/50ml Soln (Pseudoephedrine-codeine-gg) .... As needed 16)  Acetaminophen 325 Mg Tabs (Acetaminophen) .... As needed 17)  Robitussin Dm 100-10 Mg/87ml Syrp (Dextromethorphan-guaifenesin) .... As needed for cough 18)  Synthroid 112 Mcg Tabs (Levothyroxine sodium) .... Take 1 tablet by mouth once a day 19)  Flomax 0.4 Mg Caps (Tamsulosin hcl) .... Take 1 tablet by mouth once a day 20)  Xopenex Hfa 45 Mcg/act Aero (Levalbuterol tartrate) .... Inhale 2 puffs four times a day as needed  Other Orders: Est. Patient Level IV (16109)  Patient Instructions: 1)  Follow up in 2 to 3 months   Immunization History:  Influenza Immunization History:    Influenza:  historical (02/09/2010)

## 2010-06-14 NOTE — Medication Information (Signed)
Summary: ccr/jml  Anticoagulant Therapy  Managed by: Cloyde Reams, RN, BSN Referring MD: [name] MD PCP: Corrin Parker Supervising MD: Antoine Poche MD, Fayrene Fearing Indication 1: Atrial Fibrillation (ICD-427.31) Lab Used: [facility] Site: [facility] INR POC 1.7 INR RANGE 2 - 3  Dietary changes: no    Health status changes: no    Bleeding/hemorrhagic complications: no    Recent/future hospitalizations: yes       Details: Pt was in hospital, d/c on 11/10/09 after 10 days s/p MI.  Any changes in medication regimen? yes       Details: Started on Thyroid medication, and Flomax.   Recent/future dental: no  Any missed doses?: no       Is patient compliant with meds? yes       Current Medications (verified): 1)  Symbicort 160-4.5 Mcg/act  Aero (Budesonide-Formoterol Fumarate) .... 2 Puffs Two Times A Day 2)  Spiriva Handihaler 18 Mcg  Caps (Tiotropium Bromide Monohydrate) .... Use One Inhalation Daily. 3)  Xopenex Hfa 45 Mcg/act Aero (Levalbuterol Tartrate) .... Two Puffs Qid As Needed 4)  Nasonex 50 Mcg/act  Susp (Mometasone Furoate) .... Two Puffs Each Nostril Daily 5)  Zyrtec Allergy 10 Mg Tabs (Cetirizine Hcl) .... Take 1 Tablet By Mouth Once A Day 6)  Adult Aspirin Ec Low Strength 81 Mg  Tbec (Aspirin) .... One Tablet Daily. 7)  Coreg 12.5 Mg  Tabs (Carvedilol) .... Take 1/2 Tablet Two Times A Day 8)  Nitro-Dur 0.4 Mg/hr  Pt24 (Nitroglycerin) .... Place Under Tongue As Needed For Chest Pain. 9)  Lasix 40 Mg  Tabs (Furosemide) .... Take One Tabet Daily. 10)  Actos 45 Mg  Tabs (Pioglitazone Hcl) .... Take One Tablet Daily. 11)  Januvia 100 Mg  Tabs (Sitagliptin Phosphate) .... Take One Tablet Daily. 12)  Nexium 40 Mg  Cpdr (Esomeprazole Magnesium) .... Take One Capsule By Mouth Daily 13)  Lithium Carbonate 300 Mg  Caps (Lithium Carbonate) .... Take One Capsule Two Times A Day 14)  Finasteride 5 Mg Tabs (Finasteride) .Marland Kitchen.. 1 By Mouth Daily 15)  Warfarin Sodium 5 Mg Tabs  (Warfarin Sodium) .... Take As Directed Per [facility] Clinic 16)  Mucinex .... As Needed 17)  Amiodarone Hcl 200 Mg Tabs (Amiodarone Hcl) .... Take 1 Tablet By Mouth Once A Day 18)  Potassium Chloride Cr 10 Meq Cr-Caps (Potassium Chloride) .... Take One Tablet By Mouth Daily 19)  Guaifenesin Dac 30-10-100 Mg/29ml Soln (Pseudoephedrine-Codeine-Gg) .... As Needed 20)  Acetaminophen 325 Mg  Tabs (Acetaminophen) .... As Needed 21)  Robitussin Dm 100-10 Mg/26ml Syrp (Dextromethorphan-Guaifenesin) .... As Needed For Cough 22)  Synthroid 50 Mcg Tabs (Levothyroxine Sodium) .... Take One Daily 23)  Flomax 0.4 Mg Caps (Tamsulosin Hcl) .... Take 1 Tablet By Mouth Once A Day X 2 Weeks  Allergies: 1)  ! Morphine 2)  ! Lipitor 3)  ! * Ambien 4)  ! Prednisone  Anticoagulation Management History:      The patient is taking warfarin and comes in today for a routine follow up visit.  Positive risk factors for bleeding include an age of 73 years or older and presence of serious comorbidities.  The bleeding index is 'intermediate risk'.  Positive CHADS2 values include History of CHF, History of HTN, and History of Diabetes.  Negative CHADS2 values include Age > 73 years old.  The start date was 05/29/2007.  His last INR was 8.4 ratio.  Anticoagulation responsible provider: Antoine Poche MD, Fayrene Fearing.  INR POC: 1.7.  Cuvette Lot#:  [id number].  Exp: 01/2011.    Anticoagulation Management Assessment/Plan:      The patient's current anticoagulation dose is Warfarin sodium 5 mg tabs: take as directed per coumadin clinic.  The target INR is 2 - 3.  The next INR is due 12/01/2009.  Anticoagulation instructions were given to patient.  Results were reviewed/authorized by Cloyde Reams, RN, BSN.  He was notified by Cloyde Reams RN.         Prior Anticoagulation Instructions: INR 1.8  Take 1 tablet today.  Then return to normal dosing schedule of 1 tablet on Tuesday and 1/2 tablet all other days.  Return to clinic in 4 weeks.     Current Anticoagulation Instructions: INR 1.7  Take 1 tablet today then resume same dosage 1/2 tablet daily except 1 tablet on Tuesdays.  Recheck in 2 weeks.

## 2010-06-14 NOTE — Cardiovascular Report (Signed)
Summary: Health Management Program Summary Report   Health Management Program Summary Report   Imported By: Roderic Ovens 11/03/2009 11:20:08  _____________________________________________________________________  External Attachment:    Type:   Image     Comment:   External Document

## 2010-06-14 NOTE — Assessment & Plan Note (Signed)
Summary: NP follow up - ED follow up > COPD   Copy to:  [name], [name], Dr. Arbie Cookey Primary Provider/Referring Provider:  Elizebeth Brooking McGowen M.D.  CC:  increased SOB, tightness in chest, wheezing, dry cough x4days, went to [facility] 1[facility], and was given neb tx x2 and doxycycline x7days.  History of Present Illness: 75 yo with known history of COPD, hoarseness, pulmonary nodule and OSA intolerant of CPAP.   April 11, 2010 4:14 PM  c/o intermittent productive cough with thick clear mucus, increased SOB all the time, wheezing, and chest tightness x 2 to 3 days.- started with a URI Reviewed last imaging & meds, h/o feeling strung up with prednisone - not true allergy  May 21, 2010--Presents for follow up from ER. Was seen on1/6 for COPD flare . Tx w/ doxycycline x 7 days. Starting to feel some better but still coughing.  Denies chest pain, orthopnea, hemoptysis, fever, n/v/d, edema, headache.  CXR w/ no acute changes.   Medications Prior to Update: 1)  Symbicort 160-4.5 Mcg/act  Aero (Budesonide-Formoterol Fumarate) .... 2 Puffs Two Times A Day 2)  Spiriva Handihaler 18 Mcg  Caps (Tiotropium Bromide Monohydrate) .... Use One Inhalation Daily. 3)  Nasonex 50 Mcg/act  Susp (Mometasone Furoate) .... Two Puffs Each Nostril Daily 4)  Zyrtec Allergy 10 Mg Tabs (Cetirizine Hcl) .... Take 1 Tablet By Mouth Once A Day 5)  Adult Aspirin Ec Low Strength 81 Mg  Tbec (Aspirin) .... One Tablet Daily. 6)  Coreg 12.5 Mg  Tabs (Carvedilol) .... Take 1/2 Tablet Two Times A Day 7)  Nitro-Dur 0.4 Mg/hr  Pt24 (Nitroglycerin) .... Place Under Tongue As Needed For Chest Pain. 8)  Lasix 40 Mg  Tabs (Furosemide) .... Take 1tablet By Mouth Two Times A Day 9)  Januvia 100 Mg  Tabs (Sitagliptin Phosphate) .... Take One Tablet Daily. 10)  Lithium Carbonate 300 Mg  Caps (Lithium Carbonate) .... Take One Capsule Two Times A Day 11)  Finasteride 5 Mg Tabs (Finasteride) .Marland Kitchen.. 1 By Mouth Daily 12)   Warfarin Sodium 5 Mg Tabs (Warfarin Sodium) .... Take As Directed Per [facility]madin Clinic 13)  Amiodarone Hcl 200 Mg Tabs (Amiodarone Hcl) .... Take 1 Tablet By Mouth Once A Day 14)  Potassium Chloride Cr 10 Meq Cr-Caps (Potassium Chloride) .... Take One Tablet By Mouth Daily 15)  Guaifenesin Dac 30-10-100 Mg/79ml Soln (Pseudoephedrine-Codeine-Gg) .... As Needed 16)  Acetaminophen 325 Mg  Tabs (Acetaminophen) .... As Needed 17)  Robitussin Dm 100-10 Mg/77ml Syrp (Dextromethorphan-Guaifenesin) .... As Needed For Cough 18)  Synthroid 125 Mcg Tabs (Levothyroxine Sodium) .... Take 1 Tablet By Mouth Once A Day 19)  Flomax 0.4 Mg Caps (Tamsulosin Hcl) .... Take 1 Tablet By Mouth Once A Day 20)  Xopenex Hfa 45 Mcg/act Aero (Levalbuterol Tartrate) .... Inhale 2 Puffs Four Times A Day As Needed 21)  Lotrisone 1-0.05 % Crea (Clotrimazole-Betamethasone) .... 2 X A Day 22)  Topicort 0.25 % Crea (Desoximetasone) .... As Needed 23)  Benicar 20 Mg Tabs (Olmesartan Medoxomil) .... Take One-Half Tablet By Mouth Daily 24)  Isosorbide Mononitrate Cr 30 Mg Xr24h-Tab (Isosorbide Mononitrate) .... Take One Tablet By Mouth Once Daily 25)  Cyanocobalamin 1000 Mcg/ml Soln (Cyanocobalamin) .... Once Monthly 26)  Xopenex Hfa 45 Mcg/act Aero (Levalbuterol Tartrate) .... Uad 27)  Grifulvin V 500 Mg Tabs (Griseofulvin Microsize) .Marland Kitchen.. 1 Tab By Mouth Qd  Current Medications (verified): 1)  Symbicort 160-4.5 Mcg/act  Aero (Budesonide-Formoterol Fumarate) .... 2 Puffs Two  Times A Day 2)  Spiriva Handihaler 18 Mcg  Caps (Tiotropium Bromide Monohydrate) .... Use One Inhalation Daily. 3)  Nasonex 50 Mcg/act  Susp (Mometasone Furoate) .... Two Puffs Each Nostril Daily 4)  Zyrtec Allergy 10 Mg Tabs (Cetirizine Hcl) .... Take 1 Tablet By Mouth Once A Day 5)  Adult Aspirin Ec Low Strength 81 Mg  Tbec (Aspirin) .... One Tablet Daily. 6)  Coreg 12.5 Mg  Tabs (Carvedilol) .... Take 1/2 Tablet Two Times A Day 7)  Nitro-Dur 0.4 Mg/hr  Pt24  (Nitroglycerin) .... Place Under Tongue As Needed For Chest Pain. 8)  Lasix 40 Mg  Tabs (Furosemide) .... Take 1tablet By Mouth Two Times A Day 9)  Januvia 100 Mg  Tabs (Sitagliptin Phosphate) .... Take One Tablet Daily. 10)  Lithium Carbonate 300 Mg  Caps (Lithium Carbonate) .... Take One Capsule Two Times A Day 11)  Finasteride 5 Mg Tabs (Finasteride) .Marland Kitchen.. 1 By Mouth Daily 12)  Warfarin Sodium 5 Mg Tabs (Warfarin Sodium) .... Take As Directed Per Coumadin Clinic 13)  Amiodarone Hcl 200 Mg Tabs (Amiodarone Hcl) .... Take 1 Tablet By Mouth Once A Day 14)  Potassium Chloride Cr 10 Meq Cr-Caps (Potassium Chloride) .... Take One Tablet By Mouth Daily 15)  Guaifenesin Dac 30-10-100 Mg/64ml Soln (Pseudoephedrine-Codeine-Gg) .... As Needed 16)  Acetaminophen 325 Mg  Tabs (Acetaminophen) .... As Needed 17)  Robitussin Dm 100-10 Mg/65ml Syrp (Dextromethorphan-Guaifenesin) .... As Needed For Cough 18)  Synthroid 125 Mcg Tabs (Levothyroxine Sodium) .... Take 1 Tablet By Mouth Once A Day 19)  Flomax 0.4 Mg Caps (Tamsulosin Hcl) .... Take 1 Tablet By Mouth Once A Day 20)  Xopenex Hfa 45 Mcg/act Aero (Levalbuterol Tartrate) .... Inhale 2 Puffs Four Times A Day As Needed 21)  Lotrisone 1-0.05 % Crea (Clotrimazole-Betamethasone) .... 2 X A Day 22)  Topicort 0.25 % Crea (Desoximetasone) .... As Needed 23)  Benicar 20 Mg Tabs (Olmesartan Medoxomil) .... Take One-Half Tablet By Mouth Daily 24)  Isosorbide Mononitrate Cr 30 Mg Xr24h-Tab (Isosorbide Mononitrate) .... Take One Tablet By Mouth Once Daily 25)  Cyanocobalamin 1000 Mcg/ml Soln (Cyanocobalamin) .... Once Monthly 26)  Grifulvin V 500 Mg Tabs (Griseofulvin Microsize) .Marland Kitchen.. 1 Tab By Mouth Qd  Allergies (verified): 1)  ! Morphine 2)  ! Lipitor 3)  ! * Ambien 4)  ! Prednisone  Past History:  Past Medical History: Last updated: 10/02/2009 CAD (ICD-414.00)- post coronary artery bypass graft surgery with prior percutaneos intervention of the saphenous  vein graft with known saphenous vein graft disease. Abdominal aortic aneurysm      - 5.8 cm abd u/s Nov 2010 Descending thoracic aorta aneurysm Bilateral popliteal artery aneurysm CAD s/p MI Ischemic cardiomyopathy with EF 40% Paroxysmal atrial fibrillation HTN Peripheral vascular disease Dyslipidemia Diabetes mellitus GERD Hiatal hernia Diverticular disease BPH COPD      - 05/09/08 PFT FEV1 1.84 (55%), FVC 4.42 (95%), TLC 6.46 (92%), DLCO 72%, no BD response Pneumonia January, 2009 Allergic rhinitis Shingles OSA      - PSG 06/14/09 RDI 17, PLMI 96      - Intolerant of CPAP or BPAP  Past Surgical History: Last updated: 02/15/2009 Insertion of a new rate sensing pacing lead with  removal of a previous implanted ICD and insertion of device back in the pocket with defibrillation threshold testing.   Partial colectomy 1995 CABG 1996 Aorto-bifemoral bypass 1999 by [name] Early implantable cardio defibrillator August 28, 2004 by [name] stents placed to the saphenous  vein graft and also atrial fibrillation. radiofrequency ablation in 2006. Nasal surgery  Family History: Last updated: 09-16-2008  Mother died in her 47s of CVA.  Father died at 55 of   lung cancer, MI in his 1s.  He has 5 siblings, 2 of which has CAD.   Social History: Last updated: 12/27/2008  Lives in [place] with his wife.  He is a retired   Curator.  He has an 80-pack-year history of tobacco abuse, quitting 2005  He rarely has a beer.  He denies drug use.  He plays golf   twice a week, but rides the cart to the ball, hits it, and then gets   back in the cart.      Risk Factors: Smoking Status: quit (04/11/2010)  Review of Systems      See HPI  Vital Signs:  Patient profile:   75 year old male Height:      70.5 inches Weight:      171.13 pounds BMI:     24.30 O2 Sat:      94 % on Room air Temp:     96.8 degrees F oral Pulse rate:   55 / minute BP sitting:   100 / 70  (right  arm) Cuff size:   regular  Vitals Entered By: Boone Master CNA/MA (May 21, 2010 3:57 PM)  O2 Flow:  Room air CC: increased SOB, tightness in chest, wheezing, dry cough x4days, went to [facility] 1.6.12, was given neb tx x2 and doxycycline x7days Is Patient Diabetic? Yes Comments Medications reviewed with patient Daytime contact number verified with patient. Boone Master CNA/MA  May 21, 2010 3:58 PM    Physical Exam  Additional Exam:  GEN: A/Ox3; pleasant , NAD HEENT:  [place]/AT, , EACs-clear, TMs-wnl, NOSE-clear, THROAT-clear NECK:  Supple w/ fair ROM; no JVD; normal carotid impulses w/o bruits; no thyromegaly or nodules palpated; no lymphadenopathy. RESP  Coarse BS w/ nowheezing  CARD:  RRR, no m/r/g   [place]co: Warm bil,  no calf tenderness edema, clubbing, pulses intact    Impression & Recommendations:  Problem # 1:  COPD (ICD-496) Exacerbation-slolwy improving.  Plan:  xopenex neb in office Finish Doxcycline.  Mucinex DM two times a day as needed cough/congestion  Saline nasal rinses as needed  Increase fluds and rest.  Please contact office for sooner follow up if symptoms do not improve or worsen   Medications Added to Medication List This Visit: 1)  Hydromet 5-1.5 Mg/82ml Syrp (Hydrocodone-homatropine) .... 1/2-1 tsp every 6 hr as needed cough  Other Orders: Est. Patient Level III (58099)  Patient Instructions: 1)  Finish Doxcycline.  2)  Mucinex DM two times a day as needed cough/congestion  3)  Saline nasal rinses as needed  4)  Increase fluds and rest.  5)  Please contact office for sooner follow up if symptoms do not improve or worsen  Prescriptions: HYDROMET 5-1.5 MG/5ML SYRP (HYDROCODONE-HOMATROPINE) 1/2-1 tsp every 6 hr as needed cough  #8 oz x 0   Entered and Authorized by:   Rubye Oaks NP   Signed by:   Tammy Parrett NP on 05/21/2010   Method used:   Print then Give to Patient   RxID:   [id number]

## 2010-06-14 NOTE — Assessment & Plan Note (Signed)
Summary: 8 weeks/ mbw   Visit Type:  Follow-up Copy to:  [name], [name] Primary Provider/Referring Provider:  Corrin Parker  CC:  COPD.  OSA.  The patient says there is no change in his breathing. No cough. He is unable to wear or sleep with the BIPAP and says it makes his chest hurt..  History of Present Illness: 75 yo with known history of COPD, hoarseness, pulmonary nodule and OSA.  He still has intermittent episodes of hoarseness.  His breathing is okay.  He has occasional cough with clear sputum.  He is not having much wheeze, and denies fever.  He has not been able to tolerate BPAP at all, and has stopped using it.  He had CT chest recently as part of his evaluation for his thoracic aortic aneurysm.     CT of Chest  Procedure date:  08/11/2009  Findings:       Comparison:  CT cardiac study of 04/14/2009 and CTA chest of   05/25/2007    CTA CHEST    Findings:  The ascending aorta is stable and normal in caliber   measuring 31 mm.  Atheromatous change i s noted within the slightly   fusiform dilatation of the aortic arch.  The distal portion of the   arch measures 41 mm in caliber and appears stable.  The proximal   descending thoracic aorta is stable measuring 33 mm in diameter.   However there is dilatation of the mid proximal thoracic aorta   which tapers to a normal caliber distally.  Maximum diameter of the   dilated mid distal thoracic aorta is stable measuring 50 x 45 mm.   The origins of the great vessels are patent.  There is.  angulation   of the distal thoracic aorta of approximately 41 degrees.  There is   fusiform dilatation of the very distal aspect of the thoracic aorta   which is stable measuring 57 mm maximum diameter.  The thoracic   aorta does taper to normal caliber of 31 mm at the diaphragmatic   crus.    Cardiomegaly is stable.  Changes of CABG are noted.  No mediastinal   or hilar adenopathy is seen.  The pulmonary arteries  faintly   opacify with no significant abnormality noted.  Changes of COPD   again noted.  Probable nodular scarring is noted in the anterior   basal left lower lobe.  Follow-up CT may be helpful to assess   stability. No suspicious right lung nodule is seen.  No pleural   effusion is noted.     Review of the MIP images confirms the above findings.    IMPRESSION:    1. Stable appearance of the entire thoracic aorta with normal   caliber of the ascending aorta, stable fusiform dilatation of the   aortic arch, stable fusiform dilatation of the mid distal thoracic   aorta which tapers, with the very distal thoracic aortic dilatation   being stable as well.  No enlarging thoracic aortic aneurysm is   seen.    2.  Stable cardiomegaly.    3.  COPD.   4.  Probable scarring or atelectasis at the anterior medial left   lung base   Current Medications (verified): 1)  Symbicort 160-4.5 Mcg/act  Aero (Budesonide-Formoterol Fumarate) .... 2 Puffs Two Times A Day 2)  Spiriva Handihaler 18 Mcg  Caps (Tiotropium Bromide Monohydrate) .... Use One Inhalation Daily. 3)  Xopenex Hfa 45 Mcg/act Aero (Levalbuterol  Tartrate) .... Two Puffs Qid As Needed 4)  Nasonex 50 Mcg/act  Susp (Mometasone Furoate) .... Two Puffs Each Nostril Daily 5)  Zyrtec Allergy 10 Mg Tabs (Cetirizine Hcl) .... Take 1 Tablet By Mouth Once A Day 6)  Adult Aspirin Ec Low Strength 81 Mg  Tbec (Aspirin) .... One Tablet Daily. 7)  Coreg 12.5 Mg  Tabs (Carvedilol) .... Take 1/2 Tablet Two Times A Day 8)  Nitro-Dur 0.4 Mg/hr  Pt24 (Nitroglycerin) .... Place Under Tongue As Needed For Chest Pain. 9)  Lasix 40 Mg  Tabs (Furosemide) .... Take One Tabet Daily. 10)  Actos 45 Mg  Tabs (Pioglitazone Hcl) .... Take One Tablet Daily. 11)  Januvia 100 Mg  Tabs (Sitagliptin Phosphate) .... Take One Tablet Daily. 12)  Nexium 40 Mg  Cpdr (Esomeprazole Magnesium) .... Take One Capsule By Mouth Daily 13)  Lithium Carbonate 300 Mg  Caps (Lithium  Carbonate) .... Take One Capsule Two Times A Day 14)  Finasteride 5 Mg Tabs (Finasteride) .Marland Kitchen.. 1 By Mouth Daily 15)  Warfarin Sodium 5 Mg Tabs (Warfarin Sodium) .... Take As Directed Per [facility]mad[facility] Clinic 16)  Mucinex .... As Needed 17)  Amiodarone Hcl 200 Mg Tabs (Amiodarone Hcl) .... Take 1 Tablet By Mouth Once A Day 18)  Potassium Chloride Cr 10 Meq Cr-Caps (Potassium Chloride) .... Take One Tablet By Mouth Daily 19)  Guaifenesin Dac 30-10-100 Mg/31ml Soln (Pseudoephedrine-Codeine-Gg) .... As Needed 20)  Acetaminophen 325 Mg  Tabs (Acetaminophen) .... As Needed 21)  Robitussin Dm 100-10 Mg/22ml Syrp (Dextromethorphan-Guaifenesin) .... As Needed For Cough 22)  Synthroid 50 Mcg Tabs (Levothyroxine Sodium) .... Take One Daily 23)  Synthroid 50 Mcg Tabs (Levothyroxine Sodium) .Marland Kitchen.. 1 By Mouth Daily 24)  Cyanocobalamin 1000 Mcg/ml Soln (Cyanocobalamin) .... Every 2 Weeks or As Directed  Allergies (verified): 1)  ! Morphine 2)  ! Lipitor 3)  ! * Ambien 4)  ! Prednisone  Past History:  Past Medical History: CAD (ICD-414.00)- post coronary artery bypass graft surgery with prior percutaneos intervention of the saphenous vein graft with known saphenous vein graft disease. Abdominal aortic aneurysm      - 5.8 cm abd u/s Nov 2010 Descending thoracic aorta aneurysm Bilateral popliteal artery aneurysm CAD s/p MI Ischemic cardiomyopathy with EF 40% Paroxysmal atrial fibrillation HTN Peripheral vascular disease Dyslipidemia Diabetes mellitus GERD Hiatal hernia Diverticular disease BPH COPD      - 05/09/08 PFT FEV1 1.84 (55%), FVC 4.42 (95%), TLC 6.46 (92%), DLCO 72%, no BD response Pneumonia January, 2009 Allergic rhinitis Shingles OSA      - PSG 06/14/09 RDI 17, PLMI 96      - Intolerant of CPAP or BPAP  Past Surgical History: Reviewed history from 02/15/2009 and no changes required. Insertion of a new rate sensing pacing lead with  removal of a previous implanted ICD and insertion  of device back in the pocket with defibrillation threshold testing.   Partial colectomy 1995 CABG 1996 Aorto-bifemoral bypass 1999 by [name] Early implantable cardio defibrillator August 28, 2004 by [name] stents placed to the saphenous vein graft and also atrial fibrillation. radiofrequency ablation in 2006. Nasal surgery  Vital Signs:  Patient profile:   75 year old male Height:      71 inches (180.34 cm) Weight:      183 pounds (83.18 kg) BMI:     25.62 O2 Sat:      95 % on Room air Temp:     98.3 degrees F (36.83 degrees  C) oral Pulse rate:   63 / minute BP sitting:   122 / 84  (right arm) Cuff size:   regular  Vitals Entered By: Michel Bickers CMA (Oct 02, 2009 3:34 PM)  O2 Sat at Rest %:  95 O2 Flow:  Room air CC: COPD.  OSA.  The patient says there is no change in his breathing. No cough. He is unable to wear or sleep with the BIPAP and says it makes his chest hurt. Comments Medications reviewed. Daytime phone verified. Michel Bickers CMA  Oct 02, 2009 3:35 PM   Physical Exam  General:  normal appearance.   Nose:  clear nasal discharge.   Mouth:  no deformity or lesions Neck:  no JVD.   Lungs:  decreased breath sounds, no wheezing or rales Heart:  regular rate and rhythm, S1, S2 without murmurs, rubs, gallops, or clicks Extremities:  No edema Cervical Nodes:  no significant adenopathy   Impression & Recommendations:  Problem # 1:  COPD (ICD-496) He is to continue his current inhaler regimen  Problem # 2:  OBSTRUCTIVE SLEEP APNEA (ICD-327.23) He has been intolerant of CPAP and BPAP.  Will d/c his BPAP set up.  He is going to d/w his dentist if he is a candidate for an oral appliance, and will contact me once he finds out.  I have advised him that if he gets fitted for an oral appliance he will need further testing to document that his therapy is effective.  Problem # 3:  PULMONARY NODULE (ICD-518.89) He had CT chest in April.  He was schedule to have f/u  CT chest in June, but will now change his f/u CT chest to October 2011.  Problem # 4:  HOARSENESS (ZOX-096.04) He is to continue his sinus regimen.  He will monitor his symptoms for now, and then decide if he wants repeat ENT evaluation.  Problem # 5:  ALLERGIC RHINITIS (ICD-477.9) Continue his current sinus regimen.  Problem # 6:  ATRIAL FIBRILLATION (ICD-427.31) He is on chronic amiodarone, and will need periodic monitoring of his pulmonary function.  Medications Added to Medication List This Visit: 1)  Synthroid 50 Mcg Tabs (Levothyroxine sodium) .Marland Kitchen.. 1 by mouth daily 2)  Cyanocobalamin 1000 Mcg/ml Soln (Cyanocobalamin) .... Every 2 weeks or as directed  Complete Medication List: 1)  Symbicort 160-4.5 Mcg/act Aero (Budesonide-formoterol fumarate) .... 2 puffs two times a day 2)  Spiriva Handihaler 18 Mcg Caps (Tiotropium bromide monohydrate) .... Use one inhalation daily. 3)  Xopenex Hfa 45 Mcg/act Aero (Levalbuterol tartrate) .... Two puffs qid as needed 4)  Nasonex 50 Mcg/act Susp (Mometasone furoate) .... Two puffs each nostril daily 5)  Zyrtec Allergy 10 Mg Tabs (Cetirizine hcl) .... Take 1 tablet by mouth once a day 6)  Adult Aspirin Ec Low Strength 81 Mg Tbec (Aspirin) .... One tablet daily. 7)  Coreg 12.5 Mg Tabs (Carvedilol) .... Take 1/2 tablet two times a day 8)  Nitro-dur 0.4 Mg/hr Pt24 (Nitroglycerin) .... Place under tongue as needed for chest pain. 9)  Lasix 40 Mg Tabs (Furosemide) .... Take one tabet daily. 10)  Actos 45 Mg Tabs (Pioglitazone hcl) .... Take one tablet daily. 11)  Januvia 100 Mg Tabs (Sitagliptin phosphate) .... Take one tablet daily. 12)  Nexium 40 Mg Cpdr (Esomeprazole magnesium) .... Take one capsule by mouth daily 13)  Lithium Carbonate 300 Mg Caps (Lithium carbonate) .... Take one capsule two times a day 14)  Finasteride 5 Mg Tabs (Finasteride) .Marland KitchenMarland KitchenMarland Kitchen 1  by mouth daily 15)  Warfarin Sodium 5 Mg Tabs (Warfarin sodium) .... Take as directed per coumadin  clinic 16)  Mucinex  .... As needed 17)  Amiodarone Hcl 200 Mg Tabs (Amiodarone hcl) .... Take 1 tablet by mouth once a day 18)  Potassium Chloride Cr 10 Meq Cr-caps (Potassium chloride) .... Take one tablet by mouth daily 19)  Guaifenesin Dac 30-10-100 Mg/47ml Soln (Pseudoephedrine-codeine-gg) .... As needed 20)  Acetaminophen 325 Mg Tabs (Acetaminophen) .... As needed 21)  Robitussin Dm 100-10 Mg/42ml Syrp (Dextromethorphan-guaifenesin) .... As needed for cough 22)  Synthroid 50 Mcg Tabs (Levothyroxine sodium) .... Take one daily 23)  Synthroid 50 Mcg Tabs (Levothyroxine sodium) .Marland Kitchen.. 1 by mouth daily 24)  Cyanocobalamin 1000 Mcg/ml Soln (Cyanocobalamin) .... Every 2 weeks or as directed  Other Orders: Est. Patient Level III (82956) DME Referral (DME) Radiology Referral (Radiology)  Patient Instructions: 1)  Talk to your dentist about an oral appliance (mandibular advancement device) for sleep apnea 2)  Follow up in 3 months

## 2010-06-14 NOTE — Assessment & Plan Note (Signed)
Summary: 1 month/ mbw   Copy to:  [name], [name] Primary Provider/Referring Provider:  Corrin Parker  CC:  COPD.  OSA.  The patient states he is unable to use CPAP due to the pressure.  He recently had pneumonia and was given Avelox and Prednisone taper which he has finished..  History of Present Illness: 75 yo with known history of COPD, hoarseness, and OSA.  Since his last visit with me he was treated for pneumonia by Dr. Dagoberto Ligas.  Since then his breathing has been better.  He is not having much cough, wheeze, or chest congestion.  He denies fever or hemoptysis.  He has been having trouble getting used to using CPAP.  He has difficulty with his mask.  He has been having some difficulty with his DME.  He is using a full face mask.  He feels like the machine is giving him too much pressure.      Current Medications (verified): 1)  Symbicort 160-4.5 Mcg/act  Aero (Budesonide-Formoterol Fumarate) .... 2 Puffs Two Times A Day 2)  Spiriva Handihaler 18 Mcg  Caps (Tiotropium Bromide Monohydrate) .... Use One Inhalation Daily. 3)  Xopenex Hfa 45 Mcg/act Aero (Levalbuterol Tartrate) .... Two Puffs Qid As Needed 4)  Nasonex 50 Mcg/act  Susp (Mometasone Furoate) .... Two Puffs Each Nostril Daily 5)  Zyrtec Allergy 10 Mg Tabs (Cetirizine Hcl) .... Take 1 Tablet By Mouth Once A Day 6)  Adult Aspirin Ec Low Strength 81 Mg  Tbec (Aspirin) .... One Tablet Daily. 7)  Coreg 12.5 Mg  Tabs (Carvedilol) .... Take 1/2 Tablet Two Times A Day 8)  Nitro-Dur 0.4 Mg/hr  Pt24 (Nitroglycerin) .... Place Under Tongue As Needed For Chest Pain. 9)  Lasix 40 Mg  Tabs (Furosemide) .... Take One Tabet Daily. 10)  Actos 45 Mg  Tabs (Pioglitazone Hcl) .... Take One Tablet Daily. 11)  Januvia 100 Mg  Tabs (Sitagliptin Phosphate) .... Take One Tablet Daily. 12)  Nexium 40 Mg  Cpdr (Esomeprazole Magnesium) .... Take One Capsule By Mouth Daily 13)  Lithium Carbonate 300 Mg  Caps (Lithium Carbonate) .... Take  One Capsule Two Times A Day 14)  Finasteride 5 Mg Tabs (Finasteride) .Marland Kitchen.. 1 By Mouth Daily 15)  Warfarin Sodium 5 Mg Tabs (Warfarin Sodium) .... Take As Directed Per [facility] 16)  Mucinex .... As Needed 17)  Amiodarone Hcl 200 Mg Tabs (Amiodarone Hcl) .... Take 1 Tablet By Mouth Once A Day 18)  Potassium Chloride Cr 10 Meq Cr-Caps (Potassium Chloride) .... Take One Tablet By Mouth Daily 19)  Guaifenesin Dac 30-10-100 Mg/75ml Soln (Pseudoephedrine-Codeine-Gg) .... As Needed 20)  Acetaminophen 325 Mg  Tabs (Acetaminophen) .... As Needed 21)  Robitussin Dm 100-10 Mg/55ml Syrp (Dextromethorphan-Guaifenesin) .... As Needed For Cough  Allergies (verified): 1)  ! Morphine 2)  ! Lipitor 3)  ! * Ambien 4)  ! Prednisone  Past History:  Past Medical History: CAD (ICD-414.00)- post coronary artery bypass graft surgery with prior percutaneos intervention of the saphenous vein graft with known saphenous vein graft disease. Abdominal aortic aneurysm      - 5.8 cm abd u/s Nov 2010 Descending thoracic aorta aneurysm Bilateral popliteal artery aneurysm CAD s/p MI Ischemic cardiomyopathy with EF 40% Paroxysmal atrial fibrillation HTN Peripheral vascular disease Dyslipidemia Diabetes mellitus GERD Hiatal hernia Diverticular disease BPH COPD      - 05/09/08 PFT FEV1 1.84 (55%), FVC 4.42 (95%), TLC 6.46 (92%), DLCO 72%, no BD response Pneumonia January, 2009 Allergic rhinitis  Shingles OSA      - PSG 06/14/09 RDI 17, PLMI 96  Past Surgical History: Reviewed history from 02/15/2009 and no changes required. Insertion of a new rate sensing pacing lead with  removal of a previous implanted ICD and insertion of device back in the pocket with defibrillation threshold testing.   Partial colectomy 1995 CABG 1996 Aorto-bifemoral bypass 1999 by [name] Early implantable cardio defibrillator August 28, 2004 by [name] stents placed to the saphenous vein graft and also atrial  fibrillation. radiofrequency ablation in 2006. Nasal surgery  Vital Signs:  Patient profile:   75 year old male Height:      71 inches (180.34 cm) Weight:      181 pounds (82.27 kg) BMI:     25.34 O2 Sat:      93 % on Room air Temp:     97.9 degrees F (36.61 degrees C) oral Pulse rate:   72 / minute BP sitting:   114 / 70  (left arm) Cuff size:   regular  Vitals Entered By: Michel Bickers CMA (August 01, 2009 3:19 PM)  O2 Sat at Rest %:  93 O2 Flow:  Room air  Physical Exam  General:  normal appearance.   Nose:  clear nasal discharge.   Mouth:  no deformity or lesions Neck:  no JVD.   Lungs:  decreased breath sounds, no wheezing or rales Heart:  regular rate and rhythm, S1, S2 without murmurs, rubs, gallops, or clicks Extremities:  No edema Cervical Nodes:  no significant adenopathy   Impression & Recommendations:  Problem # 1:  COPD (ICD-496) He has clinical improvement after treatment for possible pneumonia.  Will repeat chest xray today to document clearing of question infiltrate on chest xray from March 1.  He is to continue his current inhaler regimen.  Problem # 2:  OBSTRUCTIVE SLEEP APNEA (ICD-327.23) He is still trying to adjust to use of CPAP.  Explained proper mask fit.  Also explained treatment objectives for his sleep apnea with regard to his cardiovascular disease.  Advised that if he has continued difficulty with his DME, that we will arrange for new DME referral.  Further adjustments to be made after review of his CPAP download.  If has continued difficulty, may need to consider change to BPAP.  Problem # 3:  PULMONARY NODULE (ICD-518.89)  He will need a follow up non-contrast CT chest in June 2011.  Problem # 4:  HOARSENESS (ICD-784.42) Improved.  Complete Medication List: 1)  Symbicort 160-4.5 Mcg/act Aero (Budesonide-formoterol fumarate) .... 2 puffs two times a day 2)  Spiriva Handihaler 18 Mcg Caps (Tiotropium bromide monohydrate) .... Use one  inhalation daily. 3)  Xopenex Hfa 45 Mcg/act Aero (Levalbuterol tartrate) .... Two puffs qid as needed 4)  Nasonex 50 Mcg/act Susp (Mometasone furoate) .... Two puffs each nostril daily 5)  Zyrtec Allergy 10 Mg Tabs (Cetirizine hcl) .... Take 1 tablet by mouth once a day 6)  Adult Aspirin Ec Low Strength 81 Mg Tbec (Aspirin) .... One tablet daily. 7)  Coreg 12.5 Mg Tabs (Carvedilol) .... Take 1/2 tablet two times a day 8)  Nitro-dur 0.4 Mg/hr Pt24 (Nitroglycerin) .... Place under tongue as needed for chest pain. 9)  Lasix 40 Mg Tabs (Furosemide) .... Take one tabet daily. 10)  Actos 45 Mg Tabs (Pioglitazone hcl) .... Take one tablet daily. 11)  Januvia 100 Mg Tabs (Sitagliptin phosphate) .... Take one tablet daily. 12)  Nexium 40 Mg Cpdr (Esomeprazole magnesium) .Marland KitchenMarland KitchenMarland Kitchen  Take one capsule by mouth daily 13)  Lithium Carbonate 300 Mg Caps (Lithium carbonate) .... Take one capsule two times a day 14)  Finasteride 5 Mg Tabs (Finasteride) .Marland Kitchen.. 1 by mouth daily 15)  Warfarin Sodium 5 Mg Tabs (Warfarin sodium) .... Take as directed per coumadin clinic 16)  Mucinex  .... As needed 17)  Amiodarone Hcl 200 Mg Tabs (Amiodarone hcl) .... Take 1 tablet by mouth once a day 18)  Potassium Chloride Cr 10 Meq Cr-caps (Potassium chloride) .... Take one tablet by mouth daily 19)  Guaifenesin Dac 30-10-100 Mg/5ml Soln (Pseudoephedrine-codeine-gg) .... As needed 20)  Acetaminophen 325 Mg Tabs (Acetaminophen) .... As needed 21)  Robitussin Dm 100-10 Mg/76ml Syrp (Dextromethorphan-guaifenesin) .... As needed for cough  Other Orders: Est. Patient Level III (26834) T-2 View CXR (71020TC)  Patient Instructions: 1)  Chest xray today 2)  Follow up in 6 to 8 weeks  CXR  Procedure date:  07/11/2009  Findings:      CHEST - 2 VIEW    Comparison: 06/06/2009    Findings: AICD noted.  Prior CABG noted.  Abnormal left mediastinal   contour is compatible with the patient's descending thoracic aortic   aneurysm.     Linear opacities in the lower lobes on the lateral projection   appear mildly increased compared to prior exams and likely   represent atelectasis superimposed on chronic scarring.    Mild prominence of upper zone vasculature may reflect pulmonary   venous hypertension.  No overt edema.  Large lung volumes suggest   COPD.  Mild thoracic spondylosis noted.    IMPRESSION:    1.  Bibasilar scarring and subsegmental atelectasis. Early   bronchopneumonia in the left lower lobe is considered less likely   differential diagnostic consideration.   2. Cardiomegaly and pulmonary venous hypertension without overt   edema.   3.  Thoracic spondylosis.   4.  Descending aortic aneurysm.   5.  Large lung volumes suggest COPD.

## 2010-06-14 NOTE — Procedures (Signed)
Summary: device/saf   Current Medications (verified): 1)  Symbicort 160-4.5 Mcg/act  Aero (Budesonide-Formoterol Fumarate) .... 2 Puffs Two Times A Day 2)  Spiriva Handihaler 18 Mcg  Caps (Tiotropium Bromide Monohydrate) .... Use One Inhalation Daily. 3)  Xopenex Hfa 45 Mcg/act Aero (Levalbuterol Tartrate) .... Two Puffs Qid As Needed 4)  Nasonex 50 Mcg/act  Susp (Mometasone Furoate) .... Two Puffs Each Nostril Daily 5)  Zyrtec Allergy 10 Mg Tabs (Cetirizine Hcl) .... Take 1 Tablet By Mouth Once A Day 6)  Adult Aspirin Ec Low Strength 81 Mg  Tbec (Aspirin) .... One Tablet Daily. 7)  Coreg 12.5 Mg  Tabs (Carvedilol) .... Take 1/2 Tablet Two Times A Day 8)  Nitro-Dur 0.4 Mg/hr  Pt24 (Nitroglycerin) .... Place Under Tongue As Needed For Chest Pain. 9)  Lasix 40 Mg  Tabs (Furosemide) .... Take One Tabet Daily. 10)  Actos 45 Mg  Tabs (Pioglitazone Hcl) .... Take One Tablet Daily. 11)  Januvia 100 Mg  Tabs (Sitagliptin Phosphate) .... Take One Tablet Daily. 12)  Nexium 40 Mg  Cpdr (Esomeprazole Magnesium) .... Take One Capsule By Mouth Daily 13)  Lithium Carbonate 300 Mg  Caps (Lithium Carbonate) .... Take One Capsule Two Times A Day 14)  Finasteride 5 Mg Tabs (Finasteride) .Marland Kitchen.. 1 By Mouth Daily 15)  Warfarin Sodium 5 Mg Tabs (Warfarin Sodium) .... Take As Directed Per Coumadin Clinic 16)  Mucinex .... As Needed 17)  Amiodarone Hcl 200 Mg Tabs (Amiodarone Hcl) .... Take 1 Tablet By Mouth Once A Day 18)  Potassium Chloride Cr 10 Meq Cr-Caps (Potassium Chloride) .... Take One Tablet By Mouth Daily 19)  Guaifenesin Dac 30-10-100 Mg/15ml Soln (Pseudoephedrine-Codeine-Gg) .... As Needed 20)  Acetaminophen 325 Mg  Tabs (Acetaminophen) .... As Needed 21)  Robitussin Dm 100-10 Mg/25ml Syrp (Dextromethorphan-Guaifenesin) .... As Needed For Cough 22)  Synthroid 50 Mcg Tabs (Levothyroxine Sodium) .... Take One Daily  Allergies (verified): 1)  ! Morphine 2)  ! Lipitor 3)  ! * Ambien 4)  !  Prednisone   ICD Specifications Following MD:  Lewayne Bunting, MD     Referring MD:  [facility] ICD Vendor:  [employer]     ICD Model Number:  7232     ICD Serial Number:  [id number] ICD DOI:  08/28/2004     ICD Implanting MD:  Lewayne Bunting, MD  Lead 1:    Location: RV     DOI: 08/28/2004     Model #: 4098     Serial #: [id number]     Status: active Lead 2:    Location: RV     DOI: 01/17/2009     Model #: 5621     Serial #: [id number]     Status: active  Indications::  ICM   ICD Follow Up Remote Check?  No Battery Voltage:  3.02 V     Charge Time:  8.24 seconds     ICD Dependent:  No       ICD Device Measurements Right Ventricle:  Amplitude: 8.2 mV, Impedance: 416 ohms, Threshold: 1.0 V at 0.4 msec Shock Impedance: 43/56 ohms   Episodes Coumadin:  Yes Shock:  0     ATP:  0     Nonsustained:  0     Ventricular Pacing:  30.7%  Brady Parameters Mode VVI     Lower Rate Limit:  55      Tachy Zones VF:  200     VT:  250 FVT VIA VF  VT1:  182     Next Cardiology Appt Due:  01/11/2010 Tech Comments:  No parameter changes.  Device function normal.  6949 lead stable.  SIC 0.  ROV 3 months clinic. Altha Harm, LPN  October 17, 6211 3:38 PM

## 2010-06-14 NOTE — Progress Notes (Signed)
Summary: cold, congestion - OV scheduled  Phone Note Call from Patient Call back at Home Phone [phone]   Caller: Patient Call For: sood Summary of Call: Pt c/o cold, congestion x2days wants abx called in pls advise.//[facility] Initial call taken by: Darletta Moll,  April 11, 2010 9:20 AM  Follow-up for Phone Call        Called, spke with pt.  He c/o chest tightness, cough, wheezing, chest congestion, and increased SOB x 2-3 days.  Cough is occas prod with a small amount of yellow and green mucus.  Denies f/c/s.  OV scheduled with RA for today at 4pm -- pt aware.   Follow-up by: Gweneth Dimitri RN,  April 11, 2010 10:33 AM

## 2010-06-14 NOTE — Progress Notes (Signed)
Summary: symbicort  Phone Note Call from Patient Call back at Home Phone [phone]   Caller: Spouse Call For: sood Summary of Call: spouse says pt is out of symbicort (she didn't call pharm) wants asap today as he is out. [facility] [place] [phone] Initial call taken by: Tivis Ringer, CNA,  February 05, 2010 12:35 PM  Follow-up for Phone Call        Pt notified that rx was sent and that he needs to be sure and keep appt for 03/02/10.  Follow-up by: Vernie Murders,  February 05, 2010 1:49 PM    Prescriptions: SYMBICORT 160-4.5 MCG/ACT  AERO (BUDESONIDE-FORMOTEROL FUMARATE) 2 puffs two times a day  #1 x 5   Entered by:   Vernie Murders   Authorized by:   Coralyn Helling MD   Signed by:   Vernie Murders on 02/05/2010   Method used:   [facility]ronically to        [facility]  [address] [phone][facility] (retail)       [address] [place], [place]  [address]       Ph: [phone] or [phone]       Fax: [phone]   RxID:   [id number]

## 2010-06-14 NOTE — Medication Information (Signed)
Summary: rov/mw  Anticoagulant Therapy  Managed by: Weston Brass, PharmD Referring MD: [name] MD PCP: Elizebeth Brooking McGowen M.D. Supervising MD: Myrtis Ser MD, Tinnie Gens Indication 1: Atrial Fibrillation (ICD-427.31) Lab Used: [facility] Site: [facility] INR POC 3.1 INR RANGE 2 - 3  Dietary changes: no    Health status changes: no    Bleeding/hemorrhagic complications: no    Recent/future hospitalizations: no    Any changes in medication regimen? no    Recent/future dental: no  Any missed doses?: no       Is patient compliant with meds? yes       Allergies: 1)  ! Morphine 2)  ! Lipitor 3)  ! * Ambien 4)  ! Prednisone  Anticoagulation Management History:      The patient is taking warfarin and comes in today for a routine follow up visit.  Positive risk factors for bleeding include an age of 75 years or older and presence of serious comorbidities.  The bleeding index is 'intermediate risk'.  Positive CHADS2 values include History of CHF, History of HTN, and History of Diabetes.  Negative CHADS2 values include Age > 71 years old.  The start date was 05/29/2007.  His last INR was 8.4 ratio.  Anticoagulation responsible Benjamim Harnish: Myrtis Ser MD, Tinnie Gens.  INR POC: 3.1.  Cuvette Lot#: [id number].  Exp: 06/2011.    Anticoagulation Management Assessment/Plan:      The patient's current anticoagulation dose is Warfarin sodium 5 mg tabs: take as directed per coumadin clinic.  The target INR is 2 - 3.  The next INR is due 06/05/2010.  Anticoagulation instructions were given to patient.  Results were reviewed/authorized by Weston Brass, PharmD.  He was notified by Weston Brass PharmD.         Prior Anticoagulation Instructions: INR 2.3 Continue taking 1 tablet on tuesday and friday. And a half tablet all other days. Recheck in 4 weeks.   Current Anticoagulation Instructions: INR 3.1  Skip today's dose of Coumadin then resume same dose of 1/2 tablet every day except 1 tablet on Tuesday and  Friday.  Recheck INR in 4 weeks.

## 2010-06-14 NOTE — Progress Notes (Signed)
Summary: need PA approval for ventolin > has been changed to Xopenex [facility]  Phone Note From Pharmacy Call back at [phone]   Caller: [facility]  [id number] Call For: sood  Summary of Call: pharmacy called re: prior auth for ventolin still has not been approved. Initial call taken by: Eugene Gavia,  January 17, 2010 4:00 PM  Follow-up for Phone Call        per 9.2.11 phone note, pt states that ventolin hfa does not help symtpoms and would like to switch back to the xopenex.  PA has been done for the xopenex.  med changed on med list.    called spoke with Nehemiah Settle, advised her of the above.  Brooke verbalized her understanding. Follow-up by: Boone Master CNA/MA,  January 17, 2010 4:38 PM    New/Updated Medications: XOPENEX HFA 45 MCG/ACT AERO (LEVALBUTEROL TARTRATE) inhale 2 puffs four times a day as needed

## 2010-06-14 NOTE — Medication Information (Signed)
Summary: rov/ewj  Anticoagulant Therapy  Managed by: Lyna Poser, PharmD Referring MD: [name] MD PCP: Corrin Parker Supervising MD: Shirlee Latch MD,Dalton Indication 1: Atrial Fibrillation (ICD-427.31) Lab Used: [facility] Site: [facility] INR POC 1.6 INR RANGE 2 - 3  Dietary changes: no    Health status changes: no    Bleeding/hemorrhagic complications: no    Recent/future hospitalizations: no    Any changes in medication regimen? no    Recent/future dental: no  Any missed doses?: yes     Details: stopped coumadin on monday. 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    Comments: dermatologist did not tell them to stop the coumadin but his primary care doctor told him to stop (dr. Dagoberto Ligas).  Allergies: 1)  ! Morphine 2)  ! Lipitor 3)  ! * Ambien 4)  ! Prednisone  Anticoagulation Management History:      Positive risk factors for bleeding include an age of 75 years or older and presence of serious comorbidities.  The bleeding index is 'intermediate risk'.  Positive CHADS2 values include History of CHF, History of HTN, and History of Diabetes.  Negative CHADS2 values include Age > 73 years old.  The start date was 05/29/2007.  His last INR was 8.4 ratio.  Anticoagulation responsible provider: Shirlee Latch MD,Dalton.  INR POC: 1.6.  Exp: 02/2011.    Anticoagulation Management Assessment/Plan:      The patient's current anticoagulation dose is Warfarin sodium 5 mg tabs: take as directed per coumadin clinic.  The target INR is 2 - 3.  The next INR is due 02/08/2010.  Anticoagulation instructions were given to patient.  Results were reviewed/authorized by Lyna Poser, PharmD.         Prior Anticoagulation Instructions: INR 3.0  Take 1/2 tablet tomorrow, then resume same dosage 1/2 tablet daily except 1 tablet on Tuesdays and Fridays.    Current Anticoagulation Instructions: INR 1.6 After your surgery, restart when the doctor tells you.  Take 1 tablet for 2 days, then resume normal  dosing schedule of 1 tablet on Tuesday and Friday and 1/2 tablet all other days.   We will recheck you in a week to make sure you are back up.

## 2010-06-14 NOTE — Progress Notes (Signed)
Summary: recent MI - FYI  Phone Note Call from Patient Call back at Home Phone [phone]   Caller: wife, Roberto [name] Call For: [name] Summary of Call: patient's wife called to inform VS that pt had MI 6.20.11 and is down to 20-25% heart muscle.  per wife no call back required, just FYI.  will forward to VS. Initial call taken by: Boone Master CNA/MA,  December 01, 2009 10:33 AM  Follow-up for Phone Call        [place]. Follow-up by: Coralyn Helling MD,  December 01, 2009 1:07 PM

## 2010-06-14 NOTE — Medication Information (Signed)
Summary: rov-tp  Anticoagulant Therapy  Managed by: Shelby Dubin, PharmD, BCPS, CPP Referring MD: [name] MD PCP: Corrin Parker Supervising MD: Shirlee Latch MD, Freida Busman Indication 1: Atrial Fibrillation (ICD-427.31) Lab Used: [facility] Site: [facility] INR POC 2.2 INR RANGE 2 - 3  Dietary changes: no    Health status changes: no    Bleeding/hemorrhagic complications: no    Recent/future hospitalizations: no    Any changes in medication regimen? no    Recent/future dental: no  Any missed doses?: no       Is patient compliant with meds? yes      Comments: taking another cold..mp  Allergies: 1)  ! Morphine 2)  ! Lipitor 3)  ! * Ambien 4)  ! Prednisone  Anticoagulation Management History:      The patient is taking warfarin and comes in today for a routine follow up visit.  Positive risk factors for bleeding include an age of 75 years or older and presence of serious comorbidities.  The bleeding index is 'intermediate risk'.  Positive CHADS2 values include History of CHF, History of HTN, and History of Diabetes.  Negative CHADS2 values include Age > 38 years old.  The start date was 05/29/2007.  His last INR was 8.4 ratio.  Anticoagulation responsible provider: Shirlee Latch MD, Dalton.  INR POC: 2.2.  Cuvette Lot#: [id number].  Exp: 06/2010.    Anticoagulation Management Assessment/Plan:      The patient's current anticoagulation dose is Warfarin sodium 5 mg tabs: take as directed per coumadin clinic.  The target INR is 2 - 3.  The next INR is due 07/13/2009.  Anticoagulation instructions were given to patient.  Results were reviewed/authorized by Shelby Dubin, PharmD, BCPS, CPP.  He was notified by Shelby Dubin PharmD, BCPS, CPP.         Prior Anticoagulation Instructions: The patient is to continue with the same dose of coumadin.  This dosage includes:   Current Anticoagulation Instructions: INR 2.2  Continue 0.5 tab daily except 1 tab each Tuesday.  Recheck in 4 weeks.

## 2010-06-14 NOTE — Assessment & Plan Note (Signed)
Summary: f2w   Visit Type:  2 weeks follow up Referring Provider:  [name], [name] Primary Provider:  Corrin Parker  CC:  No complains.  History of Present Illness: Feels good.  He is much better overall.  Was at a soup suppper at [facility] last week, and it was a good fundraiser.  But he feels good.  No chest pain.    Current Medications (verified): 1)  Symbicort 160-4.5 Mcg/act  Aero (Budesonide-Formoterol Fumarate) .... 2 Puffs Two Times A Day 2)  Spiriva Handihaler 18 Mcg  Caps (Tiotropium Bromide Monohydrate) .... Use One Inhalation Daily. 3)  Xopenex Hfa 45 Mcg/act Aero (Levalbuterol Tartrate) .... Two Puffs Qid As Needed 4)  Nasonex 50 Mcg/act  Susp (Mometasone Furoate) .... Two Puffs Each Nostril Daily 5)  Zyrtec Allergy 10 Mg Tabs (Cetirizine Hcl) .... Take 1 Tablet By Mouth Once A Day 6)  Adult Aspirin Ec Low Strength 81 Mg  Tbec (Aspirin) .... One Tablet Daily. 7)  Coreg 12.5 Mg  Tabs (Carvedilol) .... Take 1/2 Tablet Two Times A Day 8)  Nitro-Dur 0.4 Mg/hr  Pt24 (Nitroglycerin) .... Place Under Tongue As Needed For Chest Pain. 9)  Lasix 40 Mg  Tabs (Furosemide) .... Take One Tabet Daily. 10)  Actos 45 Mg  Tabs (Pioglitazone Hcl) .... Take One Tablet Daily. 11)  Januvia 100 Mg  Tabs (Sitagliptin Phosphate) .... Take One Tablet Daily. 12)  Nexium 40 Mg  Cpdr (Esomeprazole Magnesium) .... Take One Capsule By Mouth Daily 13)  Lithium Carbonate 300 Mg  Caps (Lithium Carbonate) .... Take One Capsule Two Times A Day 14)  Avodart 0.5 Mg Caps (Dutasteride) .... Take 1 Tablet By Mouth Once A Day 15)  Warfarin Sodium 5 Mg Tabs (Warfarin Sodium) .... Take As Directed Per Coumadin Clinic 16)  Mucinex .... As Needed 17)  Amiodarone Hcl 200 Mg Tabs (Amiodarone Hcl) .... Take 1 Tablet By Mouth Once A Day 18)  Potassium Chloride Cr 10 Meq Cr-Caps (Potassium Chloride) .... Take One Tablet By Mouth Daily 19)  Guaifenesin Dac 30-10-100 Mg/38ml Soln (Pseudoephedrine-Codeine-Gg) .... As  Needed 20)  Acetaminophen 325 Mg  Tabs (Acetaminophen) .... As Needed  Allergies: 1)  ! Morphine 2)  ! Lipitor 3)  ! * Ambien 4)  ! Prednisone  Vital Signs:  Patient profile:   75 year old male Height:      71 inches Weight:      179 pounds BMI:     25.06 Pulse rate:   54 / minute Pulse rhythm:   irregular Resp:     18 per minute BP sitting:   139 / 76  (left arm) Cuff size:   large  Vitals Entered By: Vikki Ports (June 20, 2009 3:26 PM)  Physical Exam  General:  Well developed, well nourished, in no acute distress. Lungs:  Ronchii in bases, minimal rales.  Clearer upper Heart:  regular rhythm.  Pos S4.  No rub. Abdomen:  Bowel sounds positive; abdomen soft and non-tender without masses, organomegaly, or hernias noted. No hepatosplenomegaly. Extremities:  No clubbing or cyanosis.   EKG  Procedure date:  06/20/2009  Findings:      SB.  Nonspecific ST and T changes.   ICD Specifications Following MD:  Lewayne Bunting, MD     ICD Vendor:  [employer]     ICD Model Number:  7232     ICD Serial Number:  [id number] ICD DOI:  08/28/2004     ICD Implanting MD:  Sharlot Gowda  Ladona Ridgel, MD  Lead 1:    Location: RV     DOI: 08/28/2004     Model #: 4098     Serial #: [id number]     Status: active Lead 2:    Location: RV     DOI: 01/17/2009     Model #: 5621     Serial #: [id number]     Status: active  Indications::  ICM   ICD Follow Up ICD Dependent:  No      Episodes Coumadin:  Yes  Brady Parameters Mode VVI     Lower Rate Limit:  55      Tachy Zones VF:  200     VT:  250     VT1:  182     Impression & Recommendations:  Problem # 1:  ATRIAL FIBRILLATION (ICD-427.31)  Failed Tikosyn, started on Amio by GT.  Will need close monitoring, espec pulmonary wise over time.  Will check Amio labs today. His updated medication list for this problem includes:    Adult Aspirin Ec Low Strength 81 Mg Tbec (Aspirin) ..... One tablet daily.    Coreg 12.5 Mg Tabs (Carvedilol) .Marland Kitchen... Take  1/2 tablet two times a day    Warfarin Sodium 5 Mg Tabs (Warfarin sodium) .Marland Kitchen... Take as directed per coumadin clinic    Amiodarone Hcl 200 Mg Tabs (Amiodarone hcl) .Marland Kitchen... Take 1 tablet by mouth once a day  His updated medication list for this problem includes:    Adult Aspirin Ec Low Strength 81 Mg Tbec (Aspirin) ..... One tablet daily.    Coreg 12.5 Mg Tabs (Carvedilol) .Marland Kitchen... Take 1/2 tablet two times a day    Warfarin Sodium 5 Mg Tabs (Warfarin sodium) .Marland Kitchen... Take as directed per coumadin clinic    Amiodarone Hcl 200 Mg Tabs (Amiodarone hcl) .Marland Kitchen... 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 Arbie Cookey in April.  Problem # 3:  CAD (ICD-414.00)  Has SVG disease, currently on meds.  Angios recently reviewed at last OV.  Difficult but not good candidate for redo CABG. His updated medication list for this problem includes:    Adult Aspirin Ec Low Strength 81 Mg Tbec (Aspirin) ..... One tablet daily.    Coreg 12.5 Mg Tabs (Carvedilol) .Marland Kitchen... Take 1/2 tablet two times a day    Nitro-dur 0.4 Mg/hr Pt24 (Nitroglycerin) .Marland Kitchen... Place under tongue as needed for chest pain.    Warfarin Sodium 5 Mg Tabs (Warfarin sodium) .Marland Kitchen... Take as directed per coumadin clinic  His updated medication list for this problem includes:    Adult Aspirin Ec Low Strength 81 Mg Tbec (Aspirin) ..... One tablet daily.    Coreg 12.5 Mg Tabs (Carvedilol) .Marland Kitchen... Take 1/2 tablet two times a day    Nitro-dur 0.4 Mg/hr Pt24 (Nitroglycerin) .Marland Kitchen... Place under tongue as needed for chest pain.    Warfarin Sodium 5 Mg Tabs (Warfarin sodium) .Marland Kitchen... Take as directed per coumadin clinic  Problem # 4:  ISCHEMIC CARDIOMYOPATHY (ICD-414.8)  Stable.  Recheck labs. His updated medication list for this problem includes:    Adult Aspirin Ec Low Strength 81 Mg Tbec (Aspirin) ..... One tablet daily.    Coreg 12.5 Mg Tabs (Carvedilol) .Marland Kitchen... Take 1/2 tablet two times a day    Nitro-dur 0.4 Mg/hr Pt24  (Nitroglycerin) .Marland Kitchen... Place under tongue as needed for chest pain.    Lasix 40 Mg Tabs (Furosemide) .Marland Kitchen... Take one tabet daily.  Warfarin Sodium 5 Mg Tabs (Warfarin sodium) .Marland Kitchen... Take as directed per coumadin clinic    Amiodarone Hcl 200 Mg Tabs (Amiodarone hcl) .Marland Kitchen... Take 1 tablet by mouth once a day  His updated medication list for this problem includes:    Adult Aspirin Ec Low Strength 81 Mg Tbec (Aspirin) ..... One tablet daily.    Coreg 12.5 Mg Tabs (Carvedilol) .Marland Kitchen... Take 1/2 tablet two times a day    Nitro-dur 0.4 Mg/hr Pt24 (Nitroglycerin) .Marland Kitchen... Place under tongue as needed for chest pain.    Lasix 40 Mg Tabs (Furosemide) .Marland Kitchen... Take one tabet daily.    Warfarin Sodium 5 Mg Tabs (Warfarin sodium) .Marland Kitchen... Take as directed per coumadin clinic    Amiodarone Hcl 200 Mg Tabs (Amiodarone hcl) .Marland Kitchen... Take 1 tablet by mouth once a day  Other Orders: TLB-BMP (Basic Metabolic Panel-BMET) (80048-METABOL) TLB-T4 (Thyrox), Free [phone]) TLB-TSH (Thyroid Stimulating Hormone) (84443-TSH) TLB-Hepatic/Liver Function Pnl (80076-HEPATIC)  Patient Instructions: 1)  Your physician recommends that you schedule a follow-up appointment in: 6 weeks. 2)  Your physician recommends that you getr lab work today. 3)  Your physician recommends that you continue on your current medications as directed. Please refer to the Current Medication list given to you today.

## 2010-06-14 NOTE — Progress Notes (Signed)
Summary: c pap machine  Phone Note Call from Patient   Caller: Patient Call For: sood Summary of Call: pt having problems with c pap machine Initial call taken by: Rickard Patience,  July 25, 2009 9:51 AM  Follow-up for Phone Call        called and spoke with pt.  pt states he recently saw VS 07-04-2009 and was started on CPAP.  Per the orders, it looks like pt was [place] with [facility] and his cpap was set on auto.  Pt states his cpap machine "is killing him."  Pt states he "cannot breath" d/t pressure too high.  Pt states cpap machine is making him "feel worse."  I asked if pt had tried the ramp button when he feels pressure is too much  and pt states he didn't know what the ramp button was or where it was located.  VS not in office this week.  Will forward message to CY to address. Arman Filter LPN  July 25, 2009 10:22 AM  Follow-up by: Waymon Budge MD,  July 25, 2009 1:30 PM  Additional Follow-up for Phone Call Additional follow up Details #1::        I am ordering tighter pressure range for his CPAP. Additional Follow-up by: Waymon Budge MD,  July 25, 2009 1:31 PM    Additional Follow-up for Phone Call Additional follow up Details #2::    [facility] informing pt of pressure change. Aundra Millet Reynolds LPN  July 25, 2009 2:01 PM

## 2010-06-14 NOTE — Medication Information (Signed)
Summary: rov/sl  Anticoagulant Therapy  Managed by: Lyna Poser, PharmD Referring MD: [name] MD PCP: Corrin Parker Supervising MD: Tenny Craw MD, Gunnar Fusi Indication 1: Atrial Fibrillation (ICD-427.31) Lab Used: [facility] Site: [facility] INR POC 2.3 INR RANGE 2 - 3  Dietary changes: no    Health status changes: yes       Details: has a cold  Bleeding/hemorrhagic complications: no    Recent/future hospitalizations: no    Any changes in medication regimen? no    Recent/future dental: yes     Details: tooth pulled tomorrow   Any missed doses?: no       Is patient compliant with meds? yes       Allergies: 1)  ! Morphine 2)  ! Lipitor 3)  ! * Ambien 4)  ! Prednisone  Anticoagulation Management History:      The patient is taking warfarin and comes in today for a routine follow up visit.  Positive risk factors for bleeding include an age of 73 years or older and presence of serious comorbidities.  The bleeding index is 'intermediate risk'.  Positive CHADS2 values include History of CHF, History of HTN, and History of Diabetes.  Negative CHADS2 values include Age > 73 years old.  The start date was 05/29/2007.  His last INR was 8.4 ratio.  Anticoagulation responsible provider: Tenny Craw MD, Gunnar Fusi.  INR POC: 2.3.  Exp: 03/2011.    Anticoagulation Management Assessment/Plan:      The patient's current anticoagulation dose is Warfarin sodium 5 mg tabs: take as directed per coumadin clinic.  The target INR is 2 - 3.  The next INR is due 05/08/2010.  Anticoagulation instructions were given to patient.  Results were reviewed/authorized by Lyna Poser, PharmD.         Prior Anticoagulation Instructions: INR 2.5  Continue taking Coumadin 0.5 tab (2.5 mg) on all days except Coumadin 1 tab (5 mg) on Tuesdays and Fridays. Return to clinic in 4 weeks.   Current Anticoagulation Instructions: INR 2.3 Continue taking 1 tablet on tuesday and friday. And a half tablet all other days.  Recheck in 4 weeks.

## 2010-06-14 NOTE — Cardiovascular Report (Signed)
Summary: Health Management Program Status Report  Health Management Program Status Report   Imported By: Roderic Ovens 11/01/2009 15:09:07  _____________________________________________________________________  External Attachment:    Type:   Image     Comment:   External Document

## 2010-06-14 NOTE — Medication Information (Signed)
Summary: rov/mw  Anticoagulant Therapy  Managed by: Bethena Midget, RN, BSN Referring MD: [name] MD PCP: Corrin Parker Supervising MD: Jens Som MD, Arlys John Indication 1: Atrial Fibrillation (ICD-427.31) Lab Used: [facility] Site: [facility] INR POC 2.2 INR RANGE 2 - 3  Dietary changes: no    Health status changes: no    Bleeding/hemorrhagic complications: no    Recent/future hospitalizations: no    Any changes in medication regimen? no    Recent/future dental: yes     Details: Pending dental extraction next week.   Any missed doses?: no       Is patient compliant with meds? 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   Allergies: 1)  ! Morphine 2)  ! Lipitor 3)  ! * Ambien 4)  ! Prednisone  Anticoagulation Management History:      The patient is taking warfarin and comes in today for a routine follow up visit.  Positive risk factors for bleeding include an age of 75 years or older and presence of serious comorbidities.  The bleeding index is 'intermediate risk'.  Positive CHADS2 values include History of CHF, History of HTN, and History of Diabetes.  Negative CHADS2 values include Age > 76 years old.  The start date was 05/29/2007.  His last INR was 8.4 ratio.  Anticoagulation responsible Eliav Mechling: Jens Som MD, Arlys John.  INR POC: 2.2.  Cuvette Lot#: [id number].  Exp: 03/2011.    Anticoagulation Management Assessment/Plan:      The patient's current anticoagulation dose is Warfarin sodium 5 mg tabs: take as directed per coumadin clinic.  The target INR is 2 - 3.  The next INR is due 03/08/2010.  Anticoagulation instructions were given to patient.  Results were reviewed/authorized by Bethena Midget, RN, BSN.  He was notified by Bethena Midget, RN, BSN.         Prior Anticoagulation Instructions: INR 1.6 After your surgery, restart when the doctor tells you.  Take 1 tablet for 2 days, then  resume normal dosing schedule of 1 tablet on Tuesday and Friday and 1/2 tablet all other days.   We will recheck you in a week to make sure you are back up.   Current Anticoagulation Instructions: INR 2.2 Continue 2.5mg  daily except 5mg s on Tuesdays and Fridays. Recheck in 4 weeks.

## 2010-06-14 NOTE — Progress Notes (Signed)
Summary: cpap  Phone Note Call from Patient Call back at Home Phone [phone]   Caller: Patient Call For: sood Reason for Call: Talk to Nurse Summary of Call: pt complaining a lot of air coming thru his cpap - can't sleep at night.  Please have nurse call.  pt says it makes him nervous. Initial call taken by: Eugene Gavia,  August 14, 2009 11:08 AM  Follow-up for Phone Call        Pt states that he cannot use his cpap at all. he states he feels pain in his chest when he uses it, it makes him feel nervous. he states there is toomuch air coming out. He says the dme company sent him a card and that slowed the air down some, but that he still feels like ther is too much air coming out. I asked pt if he thought his mask was a problem, and he staets yes he is using full face mask and can;t stand that beign on either. He staes he has stopped using cpap completely. Pt advised VS in office on Thursday. Pt ok with waiting. Please advise.  Carron Curie CMA  August 14, 2009 12:26 PM   Additional Follow-up for Phone Call Additional follow up Details #1::        WIll arrange for autoBPAP.  Please inform pt that we will try him on different type of machine. Additional Follow-up by: Coralyn Helling MD,  August 15, 2009 3:03 AM     Appended Document: cpap [id number]  Appended Document: cpap Spoke with pt and notified that we sent an order for BIPAP.  Pt states that DME has already contacted him.  He will call for any problems with BIPAP.

## 2010-06-15 ENCOUNTER — Encounter: Payer: Self-pay | Admitting: Cardiology

## 2010-06-15 ENCOUNTER — Ambulatory Visit: Admit: 2010-06-15 | Payer: Self-pay

## 2010-06-15 ENCOUNTER — Encounter: Payer: Self-pay | Admitting: Family Medicine

## 2010-06-15 ENCOUNTER — Ambulatory Visit (INDEPENDENT_AMBULATORY_CARE_PROVIDER_SITE_OTHER): Payer: Medicare Other | Admitting: Family Medicine

## 2010-06-15 ENCOUNTER — Telehealth: Payer: Self-pay | Admitting: Cardiology

## 2010-06-15 ENCOUNTER — Encounter (INDEPENDENT_AMBULATORY_CARE_PROVIDER_SITE_OTHER): Payer: Medicare Other

## 2010-06-15 DIAGNOSIS — Z7901 Long term (current) use of anticoagulants: Secondary | ICD-10-CM

## 2010-06-15 DIAGNOSIS — I4891 Unspecified atrial fibrillation: Secondary | ICD-10-CM

## 2010-06-15 DIAGNOSIS — I2589 Other forms of chronic ischemic heart disease: Secondary | ICD-10-CM

## 2010-06-15 DIAGNOSIS — J449 Chronic obstructive pulmonary disease, unspecified: Secondary | ICD-10-CM

## 2010-06-15 LAB — CONVERTED CEMR LAB: POC INR: 2.9

## 2010-06-17 ENCOUNTER — Telehealth: Payer: Self-pay | Admitting: Physician Assistant

## 2010-06-17 ENCOUNTER — Emergency Department (HOSPITAL_COMMUNITY): Payer: Medicare Other

## 2010-06-17 ENCOUNTER — Inpatient Hospital Stay (HOSPITAL_COMMUNITY)
Admission: EM | Admit: 2010-06-17 | Discharge: 2010-06-19 | DRG: 309 | Disposition: A | Discharge status: Home / Self Care | Payer: Medicare Other | Attending: Internal Medicine | Admitting: Internal Medicine

## 2010-06-17 DIAGNOSIS — I129 Hypertensive chronic kidney disease with stage 1 through stage 4 chronic kidney disease, or unspecified chronic kidney disease: Secondary | ICD-10-CM | POA: Diagnosis present

## 2010-06-17 DIAGNOSIS — J449 Chronic obstructive pulmonary disease, unspecified: Secondary | ICD-10-CM | POA: Diagnosis present

## 2010-06-17 DIAGNOSIS — N183 Chronic kidney disease, stage 3 unspecified: Secondary | ICD-10-CM | POA: Diagnosis present

## 2010-06-17 DIAGNOSIS — I509 Heart failure, unspecified: Secondary | ICD-10-CM | POA: Diagnosis present

## 2010-06-17 DIAGNOSIS — Z7901 Long term (current) use of anticoagulants: Secondary | ICD-10-CM

## 2010-06-17 DIAGNOSIS — J4489 Other specified chronic obstructive pulmonary disease: Secondary | ICD-10-CM | POA: Diagnosis present

## 2010-06-17 DIAGNOSIS — R079 Chest pain, unspecified: Secondary | ICD-10-CM

## 2010-06-17 DIAGNOSIS — I739 Peripheral vascular disease, unspecified: Secondary | ICD-10-CM | POA: Diagnosis present

## 2010-06-17 DIAGNOSIS — I252 Old myocardial infarction: Secondary | ICD-10-CM

## 2010-06-17 DIAGNOSIS — I2589 Other forms of chronic ischemic heart disease: Secondary | ICD-10-CM | POA: Diagnosis present

## 2010-06-17 DIAGNOSIS — E119 Type 2 diabetes mellitus without complications: Secondary | ICD-10-CM | POA: Diagnosis present

## 2010-06-17 DIAGNOSIS — I25810 Atherosclerosis of coronary artery bypass graft(s) without angina pectoris: Secondary | ICD-10-CM | POA: Diagnosis present

## 2010-06-17 DIAGNOSIS — F319 Bipolar disorder, unspecified: Secondary | ICD-10-CM | POA: Diagnosis present

## 2010-06-17 DIAGNOSIS — I4891 Unspecified atrial fibrillation: Principal | ICD-10-CM | POA: Diagnosis present

## 2010-06-17 DIAGNOSIS — I5022 Chronic systolic (congestive) heart failure: Secondary | ICD-10-CM | POA: Diagnosis present

## 2010-06-17 DIAGNOSIS — Z7982 Long term (current) use of aspirin: Secondary | ICD-10-CM

## 2010-06-17 DIAGNOSIS — Z8546 Personal history of malignant neoplasm of prostate: Secondary | ICD-10-CM

## 2010-06-17 DIAGNOSIS — E785 Hyperlipidemia, unspecified: Secondary | ICD-10-CM | POA: Diagnosis present

## 2010-06-17 DIAGNOSIS — R002 Palpitations: Secondary | ICD-10-CM

## 2010-06-17 DIAGNOSIS — R339 Retention of urine, unspecified: Secondary | ICD-10-CM | POA: Diagnosis present

## 2010-06-17 DIAGNOSIS — Z95810 Presence of automatic (implantable) cardiac defibrillator: Secondary | ICD-10-CM

## 2010-06-17 DIAGNOSIS — Z87891 Personal history of nicotine dependence: Secondary | ICD-10-CM

## 2010-06-17 LAB — DIFFERENTIAL
Basophils Absolute: 0 10*3/uL (ref 0.0–0.1)
Lymphocytes Relative: 12 % (ref 12–46)
Monocytes Absolute: 0.4 10*3/uL (ref 0.1–1.0)
Neutro Abs: 4.6 10*3/uL (ref 1.7–7.7)

## 2010-06-17 LAB — CARDIAC PANEL(CRET KIN+CKTOT+MB+TROPI)
CK, MB: 1.2 ng/mL (ref 0.3–4.0)
Relative Index: INVALID (ref 0.0–2.5)
Total CK: 34 U/L (ref 7–232)
Total CK: 38 U/L (ref 7–232)

## 2010-06-17 LAB — TROPONIN I: Troponin I: 0.02 ng/mL (ref 0.00–0.06)

## 2010-06-17 LAB — CBC
HCT: 40.2 % (ref 39.0–52.0)
Hemoglobin: 12.2 g/dL — ABNORMAL LOW (ref 13.0–17.0)
RBC: 4.44 MIL/uL (ref 4.22–5.81)
WBC: 5.7 10*3/uL (ref 4.0–10.5)

## 2010-06-17 LAB — PROTIME-INR
INR: 2.63 — ABNORMAL HIGH (ref 0.00–1.49)
Prothrombin Time: 28.2 seconds — ABNORMAL HIGH (ref 11.6–15.2)

## 2010-06-17 LAB — BASIC METABOLIC PANEL
GFR calc Af Amer: 57 mL/min — ABNORMAL LOW (ref >60)
GFR calc non Af Amer: 47 mL/min — ABNORMAL LOW (ref >60)
Potassium: 4.7 mEq/L (ref 3.5–5.1)
Sodium: 138 mEq/L (ref 135–145)

## 2010-06-17 LAB — CK TOTAL AND CKMB (NOT AT ARMC)
CK, MB: 1.3 ng/mL (ref 0.3–4.0)
Relative Index: INVALID (ref 0.0–2.5)

## 2010-06-17 LAB — GLUCOSE, CAPILLARY: Glucose-Capillary: 97 mg/dL (ref 70–99)

## 2010-06-18 ENCOUNTER — Encounter: Payer: Self-pay | Admitting: Family Medicine

## 2010-06-18 DIAGNOSIS — I4891 Unspecified atrial fibrillation: Secondary | ICD-10-CM

## 2010-06-18 LAB — GLUCOSE, CAPILLARY
Glucose-Capillary: 138 mg/dL — ABNORMAL HIGH (ref 70–99)
Glucose-Capillary: 107 mg/dL — ABNORMAL HIGH (ref 70–99)

## 2010-06-18 LAB — BASIC METABOLIC PANEL
BUN: 22 mg/dL (ref 6–23)
CO2: 30 mEq/L (ref 19–32)
Calcium: 9.5 mg/dL (ref 8.4–10.5)
GFR calc non Af Amer: 46 mL/min — ABNORMAL LOW (ref >60)
Glucose, Bld: 116 mg/dL — ABNORMAL HIGH (ref 70–99)
Potassium: 4.3 mEq/L (ref 3.5–5.1)

## 2010-06-19 DIAGNOSIS — I4891 Unspecified atrial fibrillation: Secondary | ICD-10-CM

## 2010-06-19 LAB — PROTIME-INR: Prothrombin Time: 31.4 seconds — ABNORMAL HIGH (ref 11.6–15.2)

## 2010-06-20 ENCOUNTER — Ambulatory Visit: Payer: Medicare Other | Admitting: Cardiology

## 2010-06-20 ENCOUNTER — Telehealth (INDEPENDENT_AMBULATORY_CARE_PROVIDER_SITE_OTHER): Payer: Self-pay | Admitting: *Deleted

## 2010-06-20 ENCOUNTER — Inpatient Hospital Stay (HOSPITAL_COMMUNITY)
Admission: AD | Admit: 2010-06-20 | Discharge: 2010-06-21 | DRG: 191 | Disposition: A | Discharge status: Home / Self Care | Payer: Medicare Other | Source: Other Acute Inpatient Hospital | Attending: Internal Medicine | Admitting: Internal Medicine

## 2010-06-20 ENCOUNTER — Emergency Department (HOSPITAL_BASED_OUTPATIENT_CLINIC_OR_DEPARTMENT_OTHER): Payer: Medicare Other

## 2010-06-20 ENCOUNTER — Emergency Department (HOSPITAL_BASED_OUTPATIENT_CLINIC_OR_DEPARTMENT_OTHER)
Admission: EM | Admit: 2010-06-20 | Discharge: 2010-06-20 | Disposition: A | Discharge status: Other Acute Inpatient Hospital | Payer: Medicare Other | Source: Home / Self Care | Attending: Emergency Medicine | Admitting: Emergency Medicine

## 2010-06-20 DIAGNOSIS — I129 Hypertensive chronic kidney disease with stage 1 through stage 4 chronic kidney disease, or unspecified chronic kidney disease: Secondary | ICD-10-CM | POA: Diagnosis present

## 2010-06-20 DIAGNOSIS — R791 Abnormal coagulation profile: Secondary | ICD-10-CM | POA: Diagnosis present

## 2010-06-20 DIAGNOSIS — Z79899 Other long term (current) drug therapy: Secondary | ICD-10-CM | POA: Insufficient documentation

## 2010-06-20 DIAGNOSIS — R059 Cough, unspecified: Secondary | ICD-10-CM

## 2010-06-20 DIAGNOSIS — J449 Chronic obstructive pulmonary disease, unspecified: Secondary | ICD-10-CM | POA: Insufficient documentation

## 2010-06-20 DIAGNOSIS — Z8546 Personal history of malignant neoplasm of prostate: Secondary | ICD-10-CM

## 2010-06-20 DIAGNOSIS — E119 Type 2 diabetes mellitus without complications: Secondary | ICD-10-CM | POA: Insufficient documentation

## 2010-06-20 DIAGNOSIS — Z7982 Long term (current) use of aspirin: Secondary | ICD-10-CM

## 2010-06-20 DIAGNOSIS — J441 Chronic obstructive pulmonary disease with (acute) exacerbation: Principal | ICD-10-CM | POA: Diagnosis present

## 2010-06-20 DIAGNOSIS — J4489 Other specified chronic obstructive pulmonary disease: Secondary | ICD-10-CM | POA: Insufficient documentation

## 2010-06-20 DIAGNOSIS — Z7901 Long term (current) use of anticoagulants: Secondary | ICD-10-CM

## 2010-06-20 DIAGNOSIS — A419 Sepsis, unspecified organism: Secondary | ICD-10-CM | POA: Insufficient documentation

## 2010-06-20 DIAGNOSIS — I2510 Atherosclerotic heart disease of native coronary artery without angina pectoris: Secondary | ICD-10-CM | POA: Diagnosis present

## 2010-06-20 DIAGNOSIS — R0602 Shortness of breath: Secondary | ICD-10-CM | POA: Insufficient documentation

## 2010-06-20 DIAGNOSIS — N183 Chronic kidney disease, stage 3 unspecified: Secondary | ICD-10-CM | POA: Diagnosis present

## 2010-06-20 DIAGNOSIS — Z951 Presence of aortocoronary bypass graft: Secondary | ICD-10-CM | POA: Insufficient documentation

## 2010-06-20 DIAGNOSIS — E7800 Pure hypercholesterolemia, unspecified: Secondary | ICD-10-CM | POA: Insufficient documentation

## 2010-06-20 DIAGNOSIS — Z87891 Personal history of nicotine dependence: Secondary | ICD-10-CM

## 2010-06-20 DIAGNOSIS — F319 Bipolar disorder, unspecified: Secondary | ICD-10-CM | POA: Diagnosis present

## 2010-06-20 DIAGNOSIS — I2589 Other forms of chronic ischemic heart disease: Secondary | ICD-10-CM | POA: Diagnosis present

## 2010-06-20 DIAGNOSIS — I509 Heart failure, unspecified: Secondary | ICD-10-CM | POA: Diagnosis present

## 2010-06-20 DIAGNOSIS — R05 Cough: Secondary | ICD-10-CM

## 2010-06-20 DIAGNOSIS — I10 Essential (primary) hypertension: Secondary | ICD-10-CM | POA: Insufficient documentation

## 2010-06-20 DIAGNOSIS — J189 Pneumonia, unspecified organism: Secondary | ICD-10-CM | POA: Insufficient documentation

## 2010-06-20 DIAGNOSIS — E785 Hyperlipidemia, unspecified: Secondary | ICD-10-CM | POA: Diagnosis present

## 2010-06-20 DIAGNOSIS — I4891 Unspecified atrial fibrillation: Secondary | ICD-10-CM | POA: Insufficient documentation

## 2010-06-20 DIAGNOSIS — I5022 Chronic systolic (congestive) heart failure: Secondary | ICD-10-CM | POA: Diagnosis present

## 2010-06-20 DIAGNOSIS — E039 Hypothyroidism, unspecified: Secondary | ICD-10-CM | POA: Diagnosis present

## 2010-06-20 LAB — URINALYSIS, ROUTINE W REFLEX MICROSCOPIC
Bilirubin Urine: NEGATIVE
Ketones, ur: NEGATIVE mg/dL
Nitrite: NEGATIVE
Protein, ur: NEGATIVE mg/dL
Specific Gravity, Urine: 1.012 (ref 1.005–1.030)
Urobilinogen, UA: 0.2 mg/dL (ref 0.0–1.0)

## 2010-06-20 LAB — COMPREHENSIVE METABOLIC PANEL
ALT: 38 U/L (ref 0–53)
Albumin: 3.4 g/dL — ABNORMAL LOW (ref 3.5–5.2)
Alkaline Phosphatase: 65 U/L (ref 39–117)
BUN: 22 mg/dL (ref 6–23)
Potassium: 4.1 mEq/L (ref 3.5–5.1)
Sodium: 136 mEq/L (ref 135–145)
Total Protein: 6.6 g/dL (ref 6.0–8.3)

## 2010-06-20 LAB — DIFFERENTIAL
Basophils Absolute: 0 10*3/uL (ref 0.0–0.1)
Basophils Relative: 0 % (ref 0–1)
Neutro Abs: 8.8 10*3/uL — ABNORMAL HIGH (ref 1.7–7.7)
Neutrophils Relative %: 84 % — ABNORMAL HIGH (ref 43–77)

## 2010-06-20 LAB — LACTIC ACID, PLASMA: Lactic Acid, Venous: 1.9 mmol/L (ref 0.5–2.2)

## 2010-06-20 LAB — POCT CARDIAC MARKERS
CKMB, poc: <1 ng/mL — ABNORMAL LOW (ref 1.0–8.0)
Troponin i, poc: 0.07 ng/mL (ref 0.00–0.09)

## 2010-06-20 LAB — PROTIME-INR
INR: 2.81 — ABNORMAL HIGH (ref 0.00–1.49)
Prothrombin Time: 29.7 seconds — ABNORMAL HIGH (ref 11.6–15.2)

## 2010-06-20 LAB — POCT I-STAT 3, VENOUS BLOOD GAS (G3P V)
Acid-Base Excess: 2 mmol/L (ref 0.0–2.0)
O2 Saturation: 73 %
Patient temperature: 98.9
pCO2, Ven: 42.1 mmHg — ABNORMAL LOW (ref 45.0–50.0)

## 2010-06-20 LAB — GLUCOSE, CAPILLARY

## 2010-06-20 LAB — CBC
Hemoglobin: 11.2 g/dL — ABNORMAL LOW (ref 13.0–17.0)
RBC: 3.98 MIL/uL — ABNORMAL LOW (ref 4.22–5.81)
WBC: 10.5 10*3/uL (ref 4.0–10.5)

## 2010-06-20 NOTE — Medication Information (Signed)
Summary: [facility]  Anticoagulant Therapy  Managed by: Cloyde Reams, RN, BSN Referring MD: [name] MD PCP: Elizebeth Brooking McGowen M.D. Supervising MD: Jens Som MD, Arlys John Indication 1: Atrial Fibrillation (ICD-427.31) Lab Used: [facility] Site: [facility] INR POC 2.9 INR RANGE 2 - 3  Dietary changes: no    Health status changes: no    Bleeding/hemorrhagic complications: no    Recent/future hospitalizations: no    Any changes in medication regimen? no    Recent/future dental: no  Any missed doses?: no       Is patient compliant with meds? yes       Allergies: 1)  ! Morphine 2)  ! Lipitor 3)  ! * Ambien 4)  ! Prednisone  Anticoagulation Management History:      The patient is taking warfarin and comes in today for a routine follow up visit.  Positive risk factors for bleeding include an age of 73 years or older and presence of serious comorbidities.  The bleeding index is 'intermediate risk'.  Positive CHADS2 values include History of CHF, History of HTN, and History of Diabetes.  Negative CHADS2 values include Age > 52 years old.  The start date was 05/29/2007.  His last INR was 8.4 ratio.  Anticoagulation responsible provider: Jens Som MD, Arlys John.  INR POC: 2.9.  Exp: 05/2011.    Anticoagulation Management Assessment/Plan:      The patient's current anticoagulation dose is Warfarin sodium 5 mg tabs: take as directed per coumadin clinic.  The target INR is 2 - 3.  The next INR is due 07/06/2010.  Anticoagulation instructions were given to patient.  Results were reviewed/authorized by Cloyde Reams, RN, BSN.  He was notified by Cloyde Reams RN.         Prior Anticoagulation Instructions: INR 4.2 Skip today's dose then change dose to 1/2 pill everyday except 1 pill on Fridays. Recheck in 2 weeks.   Current Anticoagulation Instructions: INR 2.9  Continue on same dosage 1/2 tablet daily except 1 tablet on Fridays.  Recheck in 3 weeks.

## 2010-06-20 NOTE — Progress Notes (Signed)
  Phone Note From Other Clinic   Caller: Dr Milinda Cave Summary of Call: Contacted today regarding Roberto [name]. His Lasix was recently decreased from 80-40 mg p.o. daily. He presented with increased shortness of breath. He has a history of ischemic cardiomyopathy, atrial fibrillation and COPD. The patient was noted to be in recurrent atrial fibrillation but his rate was controlled. He was not in significant distress and was not having chest pain per Dr. Milinda Cave. After discussions we increased his Lasix to 80 mg p.o. daily. He has an appointment with Dr. Riley Kill next week that has already been scheduled. Consideration at that time can be given to cardioversion if his INRs have been therapeutic.

## 2010-06-20 NOTE — Progress Notes (Signed)
Summary: Record request from [facility]  Phone Note Other Incoming   Summary of Call: Please request records from [facility] ([name]). Initial call taken by: Michell Heinrich M.D.,  June 08, 2010 11:02 AM  Follow-up for Phone Call        Left mess on pt's home #, need record release form signed, [facility] will not release records w/o consent form Follow-up by: Lannette Donath,  June 12, 2010 9:34 AM  Additional Follow-up for Phone Call Additional follow up Details #1::        Pt signed release form, faxed to [facility] [phone] Additional Follow-up by: Lannette Donath,  June 12, 2010 3:46 PM

## 2010-06-20 NOTE — Miscellaneous (Signed)
  Clinical Lists Changes  Observations: Added new observation of PAST MED HX: CAD (ICD-414.00)- post coronary artery bypass graft surgery with prior percutaneos intervention of the saphenous vein graft with known saphenous vein graft disease (cath 10/2009). 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) COPD      - 05/09/08 PFT FEV1 1.84 (55%), FVC 4.42 (95%), TLC 6.46 (92%), DLCO 72%, no BD response Pulmonary nodule (left base): resolved on f/u CT 03/02/10 Allergic rhinitis Shingles OSA      - PSG 06/14/09 RDI 17, PLMI 96      - Intolerant of CPAP or BPAP (06/13/2010 11:25) Added new observation of PRIMARY MD: Michell Heinrich M.D. (06/13/2010 11:25)       Past History:  Past Medical History: CAD (ICD-414.00)- post coronary artery bypass graft surgery with prior percutaneos intervention of the saphenous vein graft with known saphenous vein graft disease (cath 10/2009). 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) COPD      - 05/09/08 PFT FEV1 1.84 (55%), FVC 4.42 (95%), TLC 6.46 (92%), DLCO 72%, no BD response Pulmonary nodule (left base): resolved on f/u CT 03/02/10 Allergic rhinitis Shingles OSA      - PSG 06/14/09 RDI 17, PLMI 96      - Intolerant of CPAP or BPAP

## 2010-06-20 NOTE — Assessment & Plan Note (Signed)
Summary: Patient having difficulty breathing/vfw   Vital Signs:  Patient profile:   75 year old male Height:      70.5 inches (179.07 cm) Weight:      169.50 pounds (77.05 kg) O2 Sat:      98 % on Room air Temp:     97.6 degrees F (36.44 degrees C) oral Pulse rate:   96 / minute BP sitting:   148 / 82  (right arm) Cuff size:   regular  Vitals Entered By: Josph Macho RMA (June 15, 2010 9:28 AM)  O2 Flow:  Room air CC: Trouble breathing/ CF Is Patient Diabetic? Yes   History of Present Illness: 75 y/o WM with CC of SOB. Onset last night when getting ready for bed, felt short of breath but no chest pain or palpitations.  Question of wheezing, so he used his xopenex inhaler.  Felt a bit better and was able to get to sleep. Woke up more than usual with shortness of breath in night but slept okay.  This am felt the same so he did his xopenex again and felt minimally improved.  He then called our office requesting a breathing machine so we asked him to come in for a check.  No nasal  congestion/mucous, no fever, no ST, no coughing.  No fluid collecting in legs.      Of note, 2 wks ago I had him cut his lasix back from 80mg  once daily to 40 mg once daily b/c of eating/drinking poorly during an acute illness.  We also had him hold his benicar at that time.  He has now been back to normal oral intake for a week or so but we've not adjusted his meds back up yet.  Allergies: 1)  ! Morphine 2)  ! Lipitor 3)  ! * Ambien 4)  ! Prednisone  Past History:  Past Medical History: Reviewed history from 06/13/2010 and no changes required. CAD (ICD-414.00)- post coronary artery bypass graft surgery with prior percutaneos intervention of the saphenous vein graft with known saphenous vein graft disease (cath 10/2009). 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) COPD      - 05/09/08 PFT FEV1 1.84 (55%), FVC 4.42 (95%), TLC 6.46 (92%), DLCO 72%, no BD response Pulmonary nodule (left base): resolved on f/u CT 03/02/10 Allergic rhinitis Shingles OSA      - PSG 06/14/09 RDI 17, PLMI 96      - Intolerant of CPAP or BPAP  Past Surgical History: Reviewed history from 06/07/2010 and no changes required. Insertion of a new rate sensing pacing lead with  removal of a previous implanted ICD and insertion of device back in the pocket with defibrillation threshold testing.   Partial colectomy 1995 CABG 1996 AAA repair and graft 2000 Aorto-bifemoral bypass 1999 by [name] Early Implantable cardio defibrillator August 28, 2004 by [name] Stents placed to the saphenous vein graft Radiofrequency ablation (A flutter) in 2006. Nasal surgery  Family History: Reviewed history from 09/02/2008 and no changes required.  Mother died in her 7s of  CVA.  Father died at 43 of   lung cancer, MI in his 42s.  He has 5 siblings, 2 of which has CAD.   Social History: Reviewed history from 12/27/2008 and no changes required.  Lives in [place] with his wife.  He is a retired   Curator.  He has an 80-pack-year history of tobacco abuse, quitting 2005  He rarely has a beer.  He denies drug use.  He plays golf   twice a week, but rides the cart to the ball, hits it, and then gets   back in the cart.      Review of Systems  The patient denies anorexia, fever, weight loss, weight gain, vision loss, hoarseness, chest pain, syncope, peripheral edema, prolonged cough,  headaches, hemoptysis, abdominal pain, melena, hematochezia, severe indigestion/heartburn, hematuria, incontinence, genital sores, muscle weakness, suspicious skin lesions, transient blindness, difficulty walking, depression, unusual weight change, abnormal bleeding, enlarged lymph nodes, angioedema, breast masses, and testicular masses.    Physical Exam  General:  VS: noted, all normal including pulse oximetry. Gen: Alert, well appearing, oriented x 4. Chest: symmetric expansion, with nonlabored respirations.  Clear and equal breath sounds in all lung fields except for an occasional end expiratory soft wheeze.   [id number] irregular rhythm, rate in 80s-90s, no murmur.   ABD: soft, NT, ND, BS normal.   EXT: no clubbing, cyanosis, or edema.       Impression & Recommendations:  Problem # 1:  ATRIAL FIBRILLATION (ICD-427.31) Assessment Deteriorated  EKG today showed A-fib, rate in the 80s:  his ventricular rate is technically normal, but with his poor EF and now lack of atrial kick I think this is causing his current DOE. Discussed case with Dr. Jens Som, cardiologist on call for [place] (Dr. Riley Kill is out of town), and he recommended getting him back on his 80mg  once daily dosing of lasix and continue all other meds as-is.  They will arrange for him to get in to see them soon and if still in a-fib they'll likely need to cardiovert him.   I will also restart his benicar at his previous dosing of 1/2 of 20mg  tab once daily. He is set to get a PT/INR check at coumadin clinic later today.  His updated medication list for this problem includes:    Adult Aspirin Ec Low Strength 81 Mg Tbec (Aspirin) ..... One tablet daily.    Coreg 12.5 Mg Tabs (Carvedilol) .Marland Kitchen... Take 1/2 tablet two times a day    Warfarin Sodium 5 Mg Tabs (Warfarin sodium) .Marland Kitchen... Take as directed per coumadin clinic    Amiodarone Hcl 200 Mg Tabs (Amiodarone hcl) .Marland Kitchen... Take 1 tablet by mouth once a day  Orders: EKG w/  Interpretation (93000)  Complete Medication List: 1)  Symbicort 160-4.5 Mcg/act Aero (Budesonide-formoterol fumarate) .... 2 puffs two times a day 2)  Spiriva Handihaler 18 Mcg Caps (Tiotropium bromide monohydrate) .... Use one inhalation daily. 3)  Nasonex 50 Mcg/act Susp (Mometasone furoate) .... Two puffs each nostril daily 4)  Zyrtec Allergy 10 Mg Tabs (Cetirizine hcl) .... Take 1 tablet by mouth once a day 5)  Adult Aspirin Ec Low Strength 81 Mg Tbec (Aspirin) .... One tablet daily. 6)  Coreg 12.5 Mg Tabs (Carvedilol) .... Take 1/2 tablet two times a day 7)  Nitro-dur 0.4 Mg/hr Pt24 (Nitroglycerin) .... Place under tongue as needed for chest pain. 8)  Lasix 40 Mg Tabs (Furosemide) .... Take 1tablet by mouth two times a day 9)  Januvia 100  Mg Tabs (Sitagliptin phosphate) .... Take one tablet daily. 10)  Lithium Carbonate 300 Mg Caps (Lithium carbonate) .... Take one capsule two times a day 11)  Finasteride 5 Mg Tabs (Finasteride) .Marland Kitchen.. 1 by mouth daily 12)  Warfarin Sodium 5 Mg Tabs (Warfarin sodium) .... Take as directed per coumadin clinic 13)  Amiodarone Hcl 200 Mg Tabs (Amiodarone hcl) .... Take 1 tablet by mouth once a day 14)  Potassium Chloride Cr 10 Meq Cr-caps (Potassium chloride) .... Take one tablet by mouth daily 15)  Guaifenesin Dac 30-10-100 Mg/75ml Soln (Pseudoephedrine-codeine-gg) .... As needed 16)  Acetaminophen 325 Mg Tabs (Acetaminophen) .... As needed 17)  Robitussin Dm 100-10 Mg/53ml Syrp (Dextromethorphan-guaifenesin) .... As needed for cough 18)  Synthroid 125 Mcg Tabs (Levothyroxine sodium) .... Take 1 tablet by mouth once a day alt with 1/2 tab by mouth once daily 19)  Flomax 0.4 Mg Caps (Tamsulosin hcl) .... Take 1 tablet by mouth once a day 20)  Xopenex Hfa 45 Mcg/act Aero (Levalbuterol tartrate) .... Inhale 2 puffs four times a day as needed 21)  Lotrisone 1-0.05 % Crea (Clotrimazole-betamethasone) .... 2 x a day 22)  Topicort 0.25 % Crea (Desoximetasone) .... As  needed 23)  Benicar 20 Mg Tabs (Olmesartan medoxomil) .... Take one-half tablet by mouth daily 24)  Isosorbide Mononitrate Cr 30 Mg Xr24h-tab (Isosorbide mononitrate) .... Take one tablet by mouth once daily 25)  Cyanocobalamin 1000 Mcg/ml Soln (Cyanocobalamin) .... Once monthly 26)  Ferrous Sulfate 325 (65 Fe) Mg Tabs (Ferrous sulfate) .Marland Kitchen.. 1 tab by mouth bid   Orders Added: 1)  EKG w/ Interpretation [93000] 2)  Est. Patient Level IV [16109]

## 2010-06-21 ENCOUNTER — Telehealth: Payer: Self-pay | Admitting: Pulmonary Disease

## 2010-06-21 ENCOUNTER — Telehealth: Payer: Self-pay | Admitting: Family Medicine

## 2010-06-21 LAB — BASIC METABOLIC PANEL
Calcium: 9.3 mg/dL (ref 8.4–10.5)
Creatinine, Ser: 1.48 mg/dL (ref 0.4–1.5)
GFR calc Af Amer: 57 mL/min — ABNORMAL LOW (ref >60)
GFR calc non Af Amer: 47 mL/min — ABNORMAL LOW (ref >60)
Sodium: 137 mEq/L (ref 135–145)

## 2010-06-21 LAB — GLUCOSE, CAPILLARY: Glucose-Capillary: 181 mg/dL — ABNORMAL HIGH (ref 70–99)

## 2010-06-21 LAB — CBC
MCH: 28.3 pg (ref 26.0–34.0)
MCHC: 31 g/dL (ref 30.0–36.0)
RDW: 17.5 % — ABNORMAL HIGH (ref 11.5–15.5)

## 2010-06-21 LAB — INFLUENZA PANEL BY PCR (TYPE A & B)
Influenza A By PCR: NEGATIVE
Influenza B By PCR: NEGATIVE

## 2010-06-21 LAB — PROTIME-INR
INR: 3.56 — ABNORMAL HIGH (ref 0.00–1.49)
Prothrombin Time: 35.6 seconds — ABNORMAL HIGH (ref 11.6–15.2)

## 2010-06-21 LAB — STREP PNEUMONIAE URINARY ANTIGEN: Strep Pneumo Urinary Antigen: NEGATIVE

## 2010-06-22 ENCOUNTER — Encounter: Payer: Self-pay | Admitting: Cardiology

## 2010-06-22 ENCOUNTER — Encounter (INDEPENDENT_AMBULATORY_CARE_PROVIDER_SITE_OTHER): Payer: Medicare Other

## 2010-06-22 DIAGNOSIS — Z7901 Long term (current) use of anticoagulants: Secondary | ICD-10-CM

## 2010-06-22 DIAGNOSIS — I4891 Unspecified atrial fibrillation: Secondary | ICD-10-CM

## 2010-06-22 LAB — LEGIONELLA ANTIGEN, URINE

## 2010-06-24 NOTE — H&P (Signed)
Roberto [name], Roberto [name]               ACCOUNT NO.:  [id number]  MEDICAL RECORD NO.:  [id number]           PATIENT TYPE:  I  LOCATION:  2504                         FACILITY:  [facility]  PHYSICIAN:  Mariea Stable, MD   DATE OF BIRTH:  05/14/1935  DATE OF ADMISSION:  06/20/2010 DATE OF DISCHARGE:                             HISTORY & PHYSICAL   PRIMARY CARE PHYSICIAN:  Kirk Ruths, MD  PRIMARY CARDIOLOGIST:  [name]. Riley Kill, MD, [facility]  PULMONOLOGIST:  Coralyn Helling, MD  ELECTROPHYSIOLOGIST:  Dr. Ladona Ridgel.  HISTORY OF PRESENT ILLNESS:  Mr. [name] is a very pleasant 75 year old man accompanied with his wife with a very complex medical history who presents with chief complaint of shortness of breath associated with productive cough and generalized weakness.  The patient had a recent admission from February 5 to February 7 for paroxysmal atrial fibrillation.  The patient states that on Monday (2 days prior to admission) while in hospital, one of the phlebotomist apparently had a cold which he feels he may have picked up.  Since then he has had some shortness of breath with a cough that has been occasionally productive of sputum.  He describes the sputum as being anywhere from white to yellow greenish, occasionally slightly brown during this interim.  He claims to have no appetite.  Furthermore, he had generalized weakness to the point where he has had difficulty getting around at this point. Wife states that he check his oxygen regularly at home and is usually about 95%, however, he has been as low as 85 and 87 today with resolution using nasal cannula once seen in the [facility]. The patient apparently called his primary care physician as well his pulmonologist which had advised the patient to seek further evaluation. This was done at the [facility] and at that time there were concerns for possible pneumonia and had him transferred to [facility].  PAST  MEDICAL HISTORY: 1. Coronary artery disease status post prior CABG with last     catheterization in June 2011 showing 2/4 patent grafts 2. Ischemic cardiomyopathy with chronic systolic congestive heart     failure with an EF of 20-25% by echo in June 2011.  New [place] Heart     Association class II to III. 3. History of atrial flutter status post radiofrequency catheter     ablation. 4. Atrial fibrillation, on chronic Coumadin. 5. Peripheral artery disease. 6. Status post for infrarenal abdominal aortic aneurysm repair. 7. Thoracic aortic aneurysm followed by thoracic surgery. 8. Status post lower extremity bypass graft. 9. Hypertension. 10.Hyperlipidemia. 11.Diabetes. 12.COPD. 13.Remote tobacco use (50 plus pack year). 14.Bipolar disorder. 15.History of shingles. 16.Chronic kidney disease stage III. 17.History of prostate cancer. 18.History diverticulitis. 19.Status post partial colectomy  MEDICATIONS: 1. Guaifenesin 600 mg p.o. b.i.d. 2. Coreg 3.125 mg p.o. b.i.d. 3. Amiodarone 200 mg p.o. daily. 4. Aspirin 81 mg p.o. daily. 5. Benicar 20 mg half a tablet p.o. daily. 6. Finasteride 5 mg half a tablet p.o. daily. 7. Flomax 0.4 mg p.o. daily. 8. Furosemide 40 mg p.o. daily. 9. Imiquimod 5% cream apply it  every other day. 10.Iron 325 mg p.o. b.i.d. 11.Imdur 30 mg p.o. daily. 12.Januvia 100 mg p.o. daily. 13.Klor-Con 1 tablet p.o. daily. 14.Levothyroxine 125 mcg p.o. daily. 15.Lithium carbonate 300 mg p.o. b.i.d. 16.Nitroglycerin 0.4 mg sublingual p.r.n. chest pain. 17.Spiriva 18 mcg inhale daily. 18.Symbicort 160/4.5 mcg 2 puffs b.i.d. 19.Vitamin B12 IM injection monthly. 20.Coumadin 5 mg p.o. daily on Fridays, half tablet on all other days. 21.Xopenex inhaler 2 puffs q.i.d. p.r.n. 22.Zyrtec 10 mg p.o. daily.  ALLERGIES: 1. MORPHINE which causes abnormal behavior. 2. ATORVASTATIN which causes myalgias. 3. ZOLPIDEM which causes confusion. 4. MOXIFLOXACIN cause GI  upset. 5. ALDACTONE unknown. 6. PREDNISONE causes confusion. 7. CRESTOR myalgias. 8. ENALAPRIL causes hoarseness.  SOCIAL HISTORY:  The patient is married and lives with his wife in [place].  He has a daughter with grandson that live within a mile from their house.  He is a retired Curator.  He quit smoking approximately 5 years ago but has smoked for approximately 15+ years with 1 pack per day at that time.  He drinks beer "once in a blue moon." He denies any drug use.  FAMILY HISTORY:  Noncontributory.  REVIEW OF SYSTEMS:  As per HPI.  All other systems reviewed are negative.  PHYSICAL EXAMINATION:  VITAL SIGNS:  Heart rate of approximately mid 90s, blood pressure 102/64, respirations 20, oxygen saturation 93% on room air.  There is currently no temperature but verbally was afebrile at [facility]. GENERAL:  Pleasant elderly man sitting in bed in no acute distress. HEENT:  Normocephalic, atraumatic.  Pupils are equally round and reactive to light and accommodation.  Extraocular movements are intact. Sclerae are anicteric.  He is wearing glasses.  Mucous membranes are slightly dry. NECK:  Supple without any bruits.  There is no JVD. LUNGS:  There is fair air movement with mild inspiratory crackles. There is a prolonged expiratory phase with diffuse wheezing. HEART:  There is an irregular rhythm with borderline tachy rate.  There is a grade 2/6 systolic murmur.  PMI is displaced inferolaterally. ABDOMEN:  Positive bowel sounds, soft, nontender, nondistended with no organomegaly, tender or rebound. EXTREMITIES:  There is no peripheral edema. MUSCULOSKELETAL:  No joint or muscular tenderness. NEURO:  The patient is awake, alert and oriented.  Cranial nerves II-XII are grossly intact.  Strength is grossly 5/5 with normal sensation.  LABORATORY DATA:  WBC 10.5, hemoglobin 11.2, platelets 140,000.  PT 29.7, INR 2.8.  Sodium 136, potassium 4.1, chloride 101, bicarb  26, glucose 200, BUN 22, creatinine 1.6.  LFTs within normal limits except for a bilirubin of 1.3.  Lactic acid is normal at 1.9.  BNP 397.  Point- of-care cardiac markers are negative x1.  Urinalysis negative. Procalcitonin of 3.48 and lithium level of 0.82.  IMAGING: 1. Chest x-ray shows no linear atelectasis at the left lung base. 2. Telemetry.  There is atrial fibrillation with a rate of     approximately 90.  ASSESSMENT AND PLAN: 1. Chronic obstructive pulmonary disease exacerbation.  The patient     presents with upper respiratory symptoms consistent with a viral     etiology.  At this point we will go ahead and check influenza PCR,     although the patient has been immunized in September 2011.     Otherwise, we will go ahead and treat symptomatically with IV Solu-     Medrol today followed by prednisone tomorrow.  We will also     schedule albuterol nebs along with  Atrovent.  We will also provide     p.r.n. albuterol nebulizers.  We will go ahead and cover atypical     organisms along with typical community-acquired pathogens with     Avelox.  The patient's allergy to both prednisone and moxifloxacin     noted and do not think these are significant.  Currently, I do not     feel this is a true pneumonia although it is in the differential.     The patient apparently has had a T-max of about 100.5 at home,     although he does not currently have a white count and no focal     infiltrate aside from some atelectasis at the left lung base to     suggest a pneumonia.  If pneumoniae were to be considered, the     patient was just hospitalized and therefore we will need broad     coverage for healthcare-associated pneumonia.  If the patient     changes clinically, we will go ahead and broaden coverage to     include MRSA and Pseudomonas.  Currently, the patient does not have     any signs of acute heart failure as he does not have any JVD, lower     extremity edema or pulmonary edema  on chest x-ray.  Furthermore,     his BNP is slightly below prior values.  We will discontinue his     home medications for this at this point.  PE is highly unlikely as     the patient is therapeutic on his Coumadin.  The patient does have     atrial fibrillation although this is currently relatively     controlled and do not feel that this would be the cause of his     shortness of breath.  Furthermore, he denies any other symptoms to     suggest an acute coronary artery syndrome and his prior point-of-     care markers were negative x1. 2. Atrial fibrillation.  The patient is currently in AFib with a heart     rate of approximately 90-100.  We will go ahead and continue his     home medications at this point.  Of note, his INR is therapeutic at     2.8.  Again, we will not aggressively treat his heart rate at this     point as we are requiring inhaled beta-2 agonists for his     bronchospasm. 3. Coronary artery disease.  The patient does not have any current     symptoms or findings to suggest an acute coronary artery syndrome.     We will continue with his home regimen that includes aspirin, beta-     blocker.  Of note, the patient does not tolerate statins secondary     to his myalgias. 4. Ischemic cardiomyopathy with an ejection fraction of 20-25%.  The     patient currently appears euvolemic if not slightly hypovolemic.     This regimen includes Coreg, beta-blocker, Lasix, and ARB. 5. Diabetes.  The patient is on Januvia 100 mg p.o. daily.  At this     point given his decreased p.o. intake, we will cover with sliding     scale insulin, consider restarting his home regimen prior to     discharge. 6. Bipolar disorder.  We will continue the patient's home regimen.  Of     note, his lithium level is within range. 7. Hypothyroidism.  The  patient had a TSH checked on January 5 with a     level of 0.5 as well as another one on January 20 with a level of     0.16.  It is unclear as to  his prior regimen.  We will continue his     current dose of Synthroid, however should consider slight decrease     in dose especially with a history of AFib and slightly low TSH and     target a TSH of 2 as goal.     Mariea Stable, MD     MA/MEDQ  D:  06/20/2010  T:  06/20/2010  Job:  [id number]  cc:   Kirk Ruths, M.D. [name]. Riley Kill, MD, [facility] Coralyn Helling, MD Dr. Ladona Ridgel  Electronically Signed by Mariea Stable MD on 06/22/2010 01:49:50 PM

## 2010-06-25 ENCOUNTER — Ambulatory Visit (INDEPENDENT_AMBULATORY_CARE_PROVIDER_SITE_OTHER): Payer: Medicare Other | Admitting: Pulmonary Disease

## 2010-06-25 ENCOUNTER — Encounter: Payer: Self-pay | Admitting: Pulmonary Disease

## 2010-06-25 DIAGNOSIS — J4489 Other specified chronic obstructive pulmonary disease: Secondary | ICD-10-CM

## 2010-06-25 DIAGNOSIS — J309 Allergic rhinitis, unspecified: Secondary | ICD-10-CM

## 2010-06-25 DIAGNOSIS — J449 Chronic obstructive pulmonary disease, unspecified: Secondary | ICD-10-CM

## 2010-06-26 ENCOUNTER — Other Ambulatory Visit: Payer: Medicare Other

## 2010-06-26 ENCOUNTER — Other Ambulatory Visit: Payer: Self-pay | Admitting: Family Medicine

## 2010-06-26 ENCOUNTER — Encounter (INDEPENDENT_AMBULATORY_CARE_PROVIDER_SITE_OTHER): Payer: Self-pay | Admitting: *Deleted

## 2010-06-26 DIAGNOSIS — Z1289 Encounter for screening for malignant neoplasm of other sites: Secondary | ICD-10-CM

## 2010-06-26 LAB — HEMOCCULT SLIDES (X 3 CARDS)
OCCULT 1: NEGATIVE
OCCULT 2: NEGATIVE
OCCULT 5: NEGATIVE

## 2010-06-27 ENCOUNTER — Telehealth: Payer: Self-pay | Admitting: Family Medicine

## 2010-06-27 LAB — CULTURE, BLOOD (ROUTINE X 2)
Culture  Setup Time: 201202090121
Culture  Setup Time: 201202090121
Culture: NO GROWTH
Culture: NO GROWTH

## 2010-06-27 NOTE — Discharge Summary (Signed)
NAMEBRYNDON, Roberto [name]               ACCOUNT NO.:  [id number]  MEDICAL RECORD NO.:  [id number]           PATIENT TYPE:  I  LOCATION:  2012                         FACILITY:  [facility]  PHYSICIAN:  [name], MD       DATE OF BIRTH:  1935-09-23  DATE OF ADMISSION:  06/17/2010 DATE OF DISCHARGE:  06/19/2010                              DISCHARGE SUMMARY   PRIMARY CARDIOLOGIST:  Maisie Fus D. Riley Kill, MD, [facility]  ELECTROPHYSIOLOGIST:  Doylene Canning. Ladona Ridgel, MD  PRIMARY CARE PROVIDER:  Lucky Cowboy, MD  DISCHARGE DIAGNOSIS:  Paroxysmal atrial fibrillation.  SECONDARY DIAGNOSES: 1. Coronary artery disease status post prior coronary artery bypass     grafting with last catheterization in June 2011 showing 2/4 patent     grafts. 2. Ischemic cardiomyopathy/chronic systolic congestive heart failure     with an ejection fraction of 20% to 25% by echo in June 2011. 3. History of atrial flutter status post radiofrequency catheter  ablation. 4. Chronic Coumadin anticoagulation. 5. Peripheral arterial disease. 6. Status post infrarenal abdominal aortic aneurysm repair. 7. Thoracic aortic aneurysm followed by thoracic surgery. 8. Status post lower extremity bypass grafting. 9. Hypertension. 10.Hyperlipidemia. 11.Diabetes mellitus. 12.Chronic obstructive pulmonary disease. 13.Remote tobacco abuse. 14.Bipolar disorder. 15.History of shingles. 16.Stage III chronic kidney disease. 17.History of prostate cancer. 18.Status post partial colectomy. 19.History of diverticulitis.  ALLERGIES:  MORPHINE, LIPITOR, AVELOX, SPIRONOLACTONE, CRESTOR, ENALAPRIL, and AMBIEN.  PROCEDURES:  None.  HISTORY OF PRESENT ILLNESS:  A 75 year old male with the above complex problem list.  The patient had been doing well, but then towards the end of the week prior to admission, he began to experience generalized malaise, fatigue, and sometimes palpitations.  He was seen by his primary care provider on Friday, June 15, 2010, and was found to be in atrial fibrillation.  After discussion with on-call Cardiology, decision was made to titrate the patient's outpatient diuretic regimen. Despite this, the patient continued to have generalized malaise over the weekend and called the on-call staff on Sunday morning and was advised to come into the ED.  Once in the ED, the patient was in sinus rhythm with pacing on demand.  He was admitted for further evaluation.  HOSPITAL COURSE:  The patient ruled out for MI.  He was maintained on his home medications.  Evaluation of his telemetry revealed that he was frequently ventricularly pacing at a rate of 55, which was his set rate. Further comment showed that he was pacing approximately 50% of the time over the past several months.  He was in also increasing heart rate variability over the past several months, possibly suggesting more frequent atrial fibrillation.  The patient was seen by Dr. [name] and after review of interrogation information, it was felt the patient may benefit from reducing his pacing rate down to VVI 40 so that he would more often intrinsically conduct.  Further, it was recommended to reduce his Coreg dose to 3.125 mg b.i.d. from 6.25 mg b.i.d.  The patient was observed one additional night and has been feeling better.  He has had no AFib during hospitalization.  He will be  discharged home today in good condition.  If the patient continues to have ventricular pacing greater than 20% or if he has more frequent symptomatic atrial fibrillation requiring additional titration for beta- blocker/antiarrhythmic, electrophysiologic team will consider placement of an atrial lead in the future.  DISCHARGE LABS:  Hemoglobin 12.2, hematocrit 40.2, WBC 5.7, platelets 159.  INR 3.03.  Sodium 142, potassium 4.3, chloride 105, CO2 of 30, BUN 22, creatinine 1.5, glucose 116, calcium 9.5, CK 30 and MB 1.4, troponin- I 0.02, lithium 0.4.  DISPOSITION:  The  patient will be discharged home today in good condition.  FOLLOWUP PLANS AND APPOINTMENTS:  The patient will follow up [name] on July 06, 2010, at 2:45 p.m.  He is to follow up with [facility] on July 26, 2010, at 3 p.m.  He is to follow up with Dr. [name] on July 20, 2010, at 11:45 a.m.  He is to follow up with Dr. Ladona Ridgel in approximately 3 months.  He is to follow up with Dr. Oneta Rack as scheduled.  DISCHARGE MEDICATIONS: 1. Guaifenesin 600 mg b.i.d. 2. Coreg 3.125 mg b.i.d. 3. Amiodarone 200 mg daily. 4. Aspirin 81 mg daily. 5. Benicar 20 mg half tablet daily. 6. Finasteride 5 mg half tablet daily. 7. Flomax 0.4 mg daily. 8. Furosemide 40 mg daily. 9. Imiquimod 5% cream applied every other day. 10.Iron 325 mg b.i.d. 11.Imdur 30 mg daily. 12.Januvia 100 mg daily. 13.Klor-Con 1 tablet daily. 14.Levothyroxine 125 mcg daily. 15.Lithium carbonate 300 mg b.i.d. 16.Nitroglycerin 0.4 mg sublingual p.r.n. chest pain. 17.Spiriva 18 mcg inhaled daily. 18.Symbicort 160/4.5 mcg 2 puffs b.i.d. 19.Vitamin B12 one injection IM monthly. 20.Coumadin 5 mg 1 tablet on Friday, half tablet on all other days. 21.Xopenex inhaler 2 puffs q.i.d. p.r.n. 22.Zyrtec 10 mg daily.  OUTSTANDING LABS AND STUDIES:  None.  Duration of discharge encounter 45 minutes including physician time.     [name], ANP   ______________________________ [name], MD    CB/MEDQ  D:  06/19/2010  T:  06/20/2010  Job:  [id number]  cc:   Lucky Cowboy, M.D.  Electronically Signed by [name] ANP on 06/25/2010 12:04:34 PM Electronically Signed by [name] MD on 06/27/2010 10:22:22 PM

## 2010-06-28 NOTE — Progress Notes (Signed)
Summary: prescription or does pt need to be seen  Phone Note Call from Patient   Caller: spouse/vicky Call For: sood Summary of Call: Patients wife phoned Mr. Monforte was realeased from the [facility] hospital yesterday. Patient has a cold he is coughing up a green and brown mucus. He is running a fever of 100.5. They dont want him to get pneumonia and would like to know if he needs to be seen or if Dr [name] will call in an antibiotic to [facility]. They can be reached at [phone] she called their medical doctor and he is out this afternoon and his nurse sugguested that she call here. Initial call taken by: Vedia Coffer,  June 20, 2010 2:41 PM  Follow-up for Phone Call        Pt c/o productive cough with green/brown mucus, T-100.5, O2-90%RA at rest and O2-83-85%RA with exertion, decreased appetite. Denies chills, sweats, n/v/d. Pt was in the hospital due to A. Fib, had pacemaker change from 55 to 45, was started on Amiodarone and can not take a zpak. Please advise. Thanks. Zackery Barefoot CMA  June 20, 2010 3:46 PM   Additional Follow-up for Phone Call Additional follow up Details #1::        Came home with a cold, and getting worse.  More short of breath.  Has worse cough, green to brown sputum.  More sinus congestion.  Has temp of 100.5.  Gave some tylenol.  Oxygen level running low.  Not using oxygen at home.    She will take patient to primary care physician office, and then call in morning to update status. Additional Follow-up by: Coralyn Helling MD,  June 20, 2010 3:50 PM

## 2010-06-28 NOTE — Progress Notes (Signed)
Summary: Med/dose clarification  Phone Note Other Incoming   Summary of Call: Please notify his nurse at [facility] that his synthroid dose is 125mg  once daily alternating with 1/2 of a tab once daily.  It was decreased from once daily after his TSH came back low on 06/01/10 (his H&P from admission yesterday has once daily as his dose). Pls ask the nurse to notify his attending so he/she is aware. Initial call taken by: Michell Heinrich M.D.,  June 21, 2010 10:27 AM  Follow-up for Phone Call        Spoke to [place] and clarified synthroid dose.  Marcelino Duster also asked for dose on Symbicort and Spiriva, this was given. Follow-up by: Francee Piccolo CMA Duncan Dull),  June 21, 2010 10:34 AM

## 2010-06-28 NOTE — Progress Notes (Signed)
  Phone Note Call from Patient   Summary of Call: I was called earlier today by pt's wife, concerned that he had developed recurrent PAF by pulse readings, as well as some CP. the pt felt that he had gone out of rhythm on Thurs, although he never feels palps...he can tell by feeling his pulse, as did his wife. he denied and worsening SOB, or presyncope. he was also experiencing some CP, and just felt bad and "rough".  I advised his wife to take him to [facility] ED for evaluation, and she was agreeable with this recommendation. Initial call taken by: Nelida Meuse, PA-C,  June 17, 2010 2:28 PM

## 2010-06-28 NOTE — Letter (Signed)
Summary: [facility]   [facility]   Imported By: Kassie Mends 06/22/2010 10:52:20  _____________________________________________________________________  External Attachment:    Type:   Image     Comment:   External Document

## 2010-06-28 NOTE — Letter (Signed)
Summary: [facility]  [facility]   Imported By: Kassie Mends 06/22/2010 10:54:15  _____________________________________________________________________  External Attachment:    Type:   Image     Comment:   External Document

## 2010-06-28 NOTE — Letter (Signed)
Summary: [facility]  [facility]   Imported By: Marylou Mccoy 06/20/2010 09:11:02  _____________________________________________________________________  External Attachment:    Type:   Image     Comment:   External Document

## 2010-06-28 NOTE — Letter (Signed)
Summary: [facility]  [facility]   Imported By: Kassie Mends 06/22/2010 10:51:08  _____________________________________________________________________  External Attachment:    Type:   Image     Comment:   External Document

## 2010-06-28 NOTE — Letter (Signed)
Summary: [facility]  [facility]   Imported By: Marylou Mccoy 06/19/2010 16:31:31  _____________________________________________________________________  External Attachment:    Type:   Image     Comment:   External Document

## 2010-06-28 NOTE — Miscellaneous (Signed)
Clinical Lists Changes  Observations: Added new observation of SURGNAME: Dr. Arbie Cookey (Vascular) (06/18/2010 15:20) Added new observation of PULMONOL MD: Dr. [name] (06/18/2010 15:20) Added new observation of CARDIO MD: Dr. Riley Kill (06/18/2010 15:20) Added new observation of PAST MED HX: CAD (ICD-414.00)- post coronary artery bypass graft surgery with prior percutaneos intervention of the saphenous vein graft with known saphenous vein graft disease (cath 10/2009). Descending thoracic/Proximal abdominal aortic aneurysm (largest at the level of the diaphragmatic hiatus, extending to the take-off of the celiac axis)      - 6.0 cm on u/s Nov 2011 Bilateral popliteal artery aneurysm CAD s/p multiple MI's Ischemic cardiomyopathy with EF 25% (echo 10/2009) Paroxysmal atrial fibrillation (tachy/brady syndrome: has pacemaker and defibrillator) Chronic renal insufficiency (baseline Cr 1.5) HTN Peripheral vascular disease Dyslipidemia Diabetes mellitus GERD Hiatal hernia Diverticular disease BPH Prostate cancer (low grade, dx'd 07/2008--watchful waiting with [name]) COPD      - 05/09/08 PFT FEV1 1.84 (55%), FVC 4.42 (95%), TLC 6.46 (92%), DLCO 72%, no BD response Pulmonary nodule (left base): resolved on f/u CT 03/02/10 Allergic rhinitis Shingles OSA      - PSG 06/14/09 RDI 17, PLMI 96      - Intolerant of CPAP or BPAP (06/18/2010 15:20) Added new observation of PAST SURG HX: Insertion of a new rate sensing pacing lead with  removal of a previous implanted ICD and insertion of device back in the pocket with defibrillation threshold testing.   Partial colectomy 1995 CABG 1996 AAA (infrarenal) repair/graft 1999 Aorto-bifemoral bypass 1999 by [name] Early Implantable cardio defibrillator August 28, 2004 by [name] Stents placed to the saphenous vein graft Radiofrequency ablation (A flutter) in 2006. Nasal surgery  (06/18/2010 15:20) Added new observation of PRIMARY MD: Michell Heinrich M.D. (06/18/2010 15:20)       Past History:  Past Medical History: CAD (ICD-414.00)- post coronary artery bypass graft surgery with prior percutaneos intervention of the saphenous vein graft with known saphenous vein graft disease (cath 10/2009). Descending thoracic/Proximal abdominal aortic aneurysm (largest at the level of the diaphragmatic hiatus, extending to the take-off of the celiac axis)      - 6.0 cm on u/s Nov 2011 Bilateral popliteal artery aneurysm CAD s/p multiple MI's Ischemic cardiomyopathy with EF 25% (echo 10/2009) Paroxysmal atrial fibrillation (tachy/brady syndrome: has pacemaker and defibrillator) Chronic renal insufficiency (baseline Cr 1.5) HTN Peripheral vascular disease Dyslipidemia Diabetes mellitus GERD Hiatal hernia Diverticular disease BPH Prostate cancer (low grade, dx'd 07/2008--watchful waiting with [name]) COPD      - 05/09/08 PFT FEV1 1.84 (55%), FVC 4.42 (95%), TLC 6.46 (92%), DLCO 72%, no BD response Pulmonary nodule (left base): resolved on f/u CT 03/02/10 Allergic rhinitis Shingles OSA      - PSG 06/14/09 RDI 17, PLMI 96      - Intolerant of CPAP or BPAP  Past Surgical History: Insertion of a new rate sensing pacing lead with  removal of a previous implanted ICD and insertion of device back in the pocket with defibrillation threshold testing.   Partial colectomy 1995 CABG 1996 AAA (infrarenal) repair/graft 1999 Aorto-bifemoral bypass 1999 by [name] Early Implantable cardio defibrillator August 28, 2004 by [name] Stents placed to the saphenous vein graft Radiofrequency ablation (A flutter) in 2006. Nasal surgery   Care Coordination Cardiologist: Dr. Riley Kill Pulmonologist: Dr. [name] Surgeon: Dr. Arbie Cookey (Vascular)    Prevention & Chronic Care Immunizations   Influenza vaccine: Historical  (02/09/2010)    Tetanus booster: Not  documented    Pneumococcal vaccine: Not documented    H. zoster  vaccine: Not documented  Colorectal Screening   Hemoccult: Not documented    Colonoscopy: Not documented  Other Screening   PSA: Not documented   Smoking status: quit  (04/11/2010)  Diabetes Mellitus   HgbA1C: 6.4  (05/17/2010)    Eye exam: Not documented    Foot exam: Not documented   High risk foot: Not documented   Foot care education: Not documented    Urine microalbumin/creatinine ratio: Not documented  Lipids   Total Cholesterol: 166  (05/17/2010)   LDL: 101  (05/17/2010)   LDL Direct: Not documented   HDL: 46.10  (05/17/2010)   Triglycerides: 95.0  (05/17/2010)    SGOT (AST): 19  (06/01/2010)   SGPT (ALT): 22  (06/01/2010)   Alkaline phosphatase: 58  (06/01/2010)   Total bilirubin: 0.6  (06/01/2010)  Hypertension   Last Blood Pressure: 148 / 82  (06/15/2010)   Serum creatinine: 1.5  (06/01/2010)   Serum potassium 3.9  (06/01/2010)  Self-Management Support :    Diabetes self-management support: Not documented    Hypertension self-management support: Not documented    Lipid self-management support: Not documented

## 2010-06-28 NOTE — Medication Information (Signed)
Summary: Coumadin Clinic  Anticoagulant Therapy  Managed by: Georgina Pillion, PharmD Referring MD: [name] MD PCP: Elizebeth Brooking McGowen M.D. Supervising MD: Daleen Squibb MD, Maisie Fus Indication 1: Atrial Fibrillation (ICD-427.31) Lab Used: [facility] Site: [facility] INR POC 2.0 INR RANGE 2 - 3  Dietary changes: no    Health status changes: no    Bleeding/hemorrhagic complications: no    Recent/future hospitalizations: yes       Details: Admitted from 2/5-2/7 for Afib and then readmitted from 2/9-2/9 for SOB and questionable PNA  Any changes in medication regimen? yes       Details: Avelox  Recent/future dental: no  Any missed doses?: yes     Details: Held coumadin last night because he was discharged with INR of 3.56  Is patient compliant with meds? yes      Comments: Will decrease dose in anticipation of elevated INR with Avelox use.  Allergies: 1)  ! Morphine 2)  ! Lipitor 3)  ! * Ambien 4)  ! Prednisone  Anticoagulation Management History:      Positive risk factors for bleeding include an age of 73 years or older and presence of serious comorbidities.  The bleeding index is 'intermediate risk'.  Positive CHADS2 values include History of CHF, History of HTN, and History of Diabetes.  Negative CHADS2 values include Age > 46 years old.  The start date was 05/29/2007.  His last INR was 8.4 ratio.  Anticoagulation responsible provider: Daleen Squibb MD, Maisie Fus.  INR POC: 2.0.  Cuvette Lot#: [id number].  Exp: 05/2011.    Anticoagulation Management Assessment/Plan:      The patient's current anticoagulation dose is Warfarin sodium 5 mg tabs: take as directed per coumadin clinic.  The target INR is 2 - 3.  The next INR is due 06/29/2010.  Anticoagulation instructions were given to patient.  Results were reviewed/authorized by Georgina Pillion, PharmD.         Prior Anticoagulation Instructions: INR 2.9  Continue on same dosage 1/2 tablet daily except 1 tablet on Fridays.  Recheck in  3 weeks.   Current Anticoagulation Instructions: Change regimen to take 1/2 tablet (2.5 mg) daily.  INR 2.0

## 2010-06-29 ENCOUNTER — Encounter: Payer: Self-pay | Admitting: Cardiology

## 2010-06-29 ENCOUNTER — Encounter (INDEPENDENT_AMBULATORY_CARE_PROVIDER_SITE_OTHER): Payer: Medicare Other

## 2010-06-29 DIAGNOSIS — Z7901 Long term (current) use of anticoagulants: Secondary | ICD-10-CM

## 2010-06-29 DIAGNOSIS — I4891 Unspecified atrial fibrillation: Secondary | ICD-10-CM

## 2010-06-29 LAB — CONVERTED CEMR LAB: POC INR: 2.7

## 2010-07-04 NOTE — Cardiovascular Report (Signed)
Summary: Health Management Program Summary Report   Health Management Program Summary Report   Imported By: Roderic Ovens 06/28/2010 16:01:02  _____________________________________________________________________  External Attachment:    Type:   Image     Comment:   External Document

## 2010-07-04 NOTE — Medication Information (Signed)
Summary: rov/kh  Anticoagulant Therapy  Managed by: Tammy Sours, PharmD Referring MD: [name] MD PCP: Michell Heinrich M.D. Supervising MD: Daleen Squibb MD, Maisie Fus Indication 1: Atrial Fibrillation (ICD-427.31) Lab Used: [facility] Site: [facility] INR POC 2.7 INR RANGE 2 - 3  Dietary changes: yes       Details: Ate 3 servings of greens this past week per patient was told to at last visit   Health status changes: no    Bleeding/hemorrhagic complications: no    Recent/future hospitalizations: no    Any changes in medication regimen? no    Recent/future dental: no  Any missed doses?: no       Is patient compliant with meds? yes      Comments: Patient states has 2 more days of prednisone left. Patient states that he think his last dose of his 5 day course of Avelox was around Monday or Tuesday. Anticpating INR to drop, so we decided to increase patient back to dose before course of antibiotics and prednisone. Recomennded INR check in 2 weeks, pt wanted to come back in 3 weeks.   Allergies: 1)  ! Morphine 2)  ! Lipitor 3)  ! * Ambien 4)  ! Prednisone  Anticoagulation Management History:      The patient is taking warfarin and comes in today for a routine follow up visit.  Positive risk factors for bleeding include an age of 33 years or older and presence of serious comorbidities.  The bleeding index is 'intermediate risk'.  Positive CHADS2 values include History of CHF, History of HTN, and History of Diabetes.  Negative CHADS2 values include Age > 61 years old.  The start date was 05/29/2007.  His last INR was 8.4 ratio.  Anticoagulation responsible provider: Daleen Squibb MD, Maisie Fus.  INR POC: 2.7.  Cuvette Lot#: [id number].  Exp: 05/2011.    Anticoagulation Management Assessment/Plan:      The patient's current anticoagulation dose is Warfarin sodium 5 mg tabs: take as directed per coumadin clinic.  The target INR is 2 - 3.  The next INR is due 07/19/2010.  Anticoagulation  instructions were given to patient.  Results were reviewed/authorized by Tammy Sours, PharmD.         Prior Anticoagulation Instructions: Change regimen to take 1/2 tablet (2.5 mg) daily.  INR 2.0  Current Anticoagulation Instructions: INR 2.7  Begin taking 1/2 tablet everyday except 1 tablet on Fridays. Recheck INR in 3 weeks.

## 2010-07-04 NOTE — H&P (Signed)
[name], [name]               ACCOUNT NO.:  [id number]  MEDICAL RECORD NO.:  [id number]           PATIENT TYPE:  I  LOCATION:  2012                         FACILITY:  [facility]  PHYSICIAN:  Bevelyn Buckles. Sharmon Cheramie, MDDATE OF BIRTH:  [birth year]  DATE OF ADMISSION:  06/17/2010 DATE OF DISCHARGE:                             HISTORY & PHYSICAL   PRIMARY CARDIOLOGIST:  Roberto [name].  PRIMARY CARE PHYSICIAN:  Roberto [name].  REASON FOR ADMISSION:  Roberto [name] is a very pleasant 75 year old male, with very complex medical history, very well known to Roberto [name], whose wife contacted me earlier this morning, with concern that her husband had gone back into atrial fibrillation.  The patient apparently had been doing extremely well since being placed on amiodarone, until this past Thursday.  He felt that he had gone back "out of rhythm", although he has never experienced any frank tachy palpitations.  He states that he only knows this to be such, given his prior experiences.  He also presented to Roberto [name] last Friday, and had an EKG done, which reportedly confirmed that he was in atrial fibrillation.  Over these past 2 days, the patient apparently has been feeling intermittently week, with some mild associated shortness of breath.  Earlier this morning, he was not feeling well at all, complaining of "indigestion," chest discomfort, referred to as "soreness," and an overall sensation of feeling "rub."  After several minutes in discussing this situation with his wife, I instructed them to present to the emergency room for further evaluation.  On admission, the patient was noted to be in normal sinus rhythm, as confirmed by electrocardiogram.  Cardiac markers were drawn, and are within normal limits.  With respect to the chest pain, the patient states that he is currently pain free, but does have this intermittent soreness both in the upper chest, as well as in the epigastrium.   He  initially felt that this might be due to his reflux disease, and took  some Maalox, with reported relief.  ALLERGIES:  MORPHINE, LIPITOR, AVELOX, SPIRONOLACTONE, CRESTOR, ENALAPRIL, and AMBIEN.  HOME MEDICATIONS: 1. Amiodarone 200 mg daily. 2. Aspirin 81 mg daily. 3. Coumadin 2.5 mg daily, except 5 mg every Friday. 4. Benicar 10 mg daily. 5. Carvedilol 6.25 mg b.i.d. 6. Finasteride 5 mg daily. 7. Flomax 0.4 mg daily. 8. Imdur 30 mg daily. 9. Januvia 100 mg daily. 10.Lasix 40 mg b.i.d. 11.Lithium carbonate 300 mg b.i.d. 12.Lotrisone cream 0.05% b.i.d. 13.Nasonex spray 2 puffs each nares daily. 14.Potassium chloride 10 mEq daily. 15.Spiriva one capsule daily. 16.Symbicort 2 puffs b.i.d.  PAST MEDICAL HISTORY: 1. Severe ischemic cardiomyopathy.     a.     Status post CABG in 1996.     b.     2/4 graft SVG grafts 100% occluded, most recently SVG - CFX      graft, by catheterization, June 2011.     c.     Patent LIMA-LAD and SVG - diagonal grafts; chronically      occluded SVG-RCA graft.     d.     History of multiple NSTEMIs.  e.     Status post ICD implantation.     f.     EF 20-25%, by 2-D echo, June 2011. 2. Atrial arrhythmia.     a.     Paroxysmal atrial fibrillation, on amiodarone.     b.     History of atrial flutter, status post RF ablation. 3. Chronic Coumadin. 4. Severe peripheral arterial disease.     a.     Status post infrarenal AAA repair.     b.     Descending TAA, followed by [facility].     c.     Status post lower extremity bypass graft. 5. Chronic systolic heart failure. 6. Hypertension. 7. Dyslipidemia. 8. Diabetes mellitus. 9. COPD.     a.     Remote tobacco. 10.Pulmonary nodules. 11.History of shingles. 12.Bipolar disorder. 13.Urinary retention. 14.Chronic kidney disease. 15.Diverticulitis. 16.Status post partial colectomy. 17.History of prostate cancer.  SOCIAL HISTORY:  Patient is married, and quit  smoking approximately 5 years ago.  He is a retired Curator.  FAMILY HISTORY:  Noncontributory for premature CAD.  REVIEW OF SYSTEMS:  As outlined per HPI.  Remaining systems reviewed, negative.  PHYSICAL EXAMINATION:  VITAL SIGNS:  Blood pressure currently 106/64, pulse 57 regular, respirations of 14, temperature 97.7, sats 100% on 2 liters. GENERAL:  A 74-hour-old male, sitting upright, in no distress. HEENT:  He is normocephalic, atraumatic.  PERRLA.  EOMI. NECK:  Palpable bilateral carotid pulse without bruits; no JVD at 30 degrees. LUNGS:  Diminished breath sounds with diffuse expiratory wheezes, no crackles. HEART:  Regular rate and rhythm, soft grade 2-3/6 holosystolic murmur extending to the left axillary line.   ABDOMEN:  Protuberant, intact bowel sounds. EXTREMITIES:  No significant peripheral edema. SKIN:  Warm and dry. MUSCULOSKELETAL:  No obvious deformity. NEURO:  No focal deficit.  Chest x-ray: no acute changes.  Admission EKG:  Normal sinus rhythm at 60 bpm; LAD; question old ASMI; chronic T-wave inversion in the lateral leads, with no significant change since previous study of May 18, 2010.  LABORATORY DATA:  CPK 39/1.3, troponin I 0.02, INR 2.6.  Sodium 138, potassium 4.7, BUN 18, creatinine 1.5 (GFR 47), glucose 117.  WBC 5.7, hemoglobin 12.2, hematocrit 40.2, and platelet 180.  IMPRESSION:  Roberto [name] is a 75 year old male, with complex medical history, including severe ischemic cardiomyopathy, well known to Roberto [name], who was referred to the emergency room for evaluation of chest pain and palpitations.  On admission, he was noted to be in normal sinus rhythm.  Initial set of cardiac markers is normal.  The patient states that he was doing quite well, until the past 2-3 days, at which time he believes that he has gone back "out of rhythm."  He otherwise states that he had been doing extremely well, since initially being placed on amiodarone.  He  is on chronic Coumadin, and presents with a supratherapeutic INR level.  PLAN:  Recommendation is to admit for 23-hour observation, for cycling of cardiac markers to rule out myocardial infarction, and continuous monitoring on telemetry to rule out recurrent atrial fibrillation.  The patient will be continued on current home medication regimen, and we will check a followup metabolic profile in the a.m.  Recommendation is also to interrogate his defibrillator in the morning, and we will otherwise defer to Roberto [name], regarding any further workup or diagnostic  testing.  Of note, the patient seems to suggest that some of  the symptoms  are related to his recently having been placed on supplemental iron, and  we will place him on a daily laxative.     Gene Serpe, PA-C   ______________________________ Bevelyn Buckles. Yameli Delamater, MD    GS/MEDQ  D:  06/17/2010  T:  06/18/2010  Job:  [id number]  Electronically Signed by Roberto Searing PA-C on 06/19/2010 11:35:24 AM Electronically Signed by Arvilla Meres MD on 07/04/2010 01:57:05 PM

## 2010-07-04 NOTE — Letter (Signed)
Summary: [employer] - Health Management Program  [facility] - Health Management Program   Imported By: Marylou Mccoy 06/28/2010 17:05:11  _____________________________________________________________________  External Attachment:    Type:   Image     Comment:   External Document

## 2010-07-04 NOTE — Progress Notes (Signed)
Summary: update status  Phone Note Call from Patient Call back at [phone]   Caller: Spouse//Roberto [name] Call For: Roberto [name] Summary of Call: States she's calling in to give VS an update status on pt. Initial call taken by: Darletta Moll,  June 21, 2010 9:45 AM  Follow-up for Phone Call        Spoke with pt's spouse.  She wanted to let VS know that pt was seen at [facility] last night and after had CXR was admitted to [facility] room 2504.  She states that CXR showed questionable PNA.  Will forward to VS so that he is aware.  Follow-up by: Vernie Murders,  June 21, 2010 10:02 AM

## 2010-07-04 NOTE — Progress Notes (Signed)
Summary: Lab results  Phone Note Other Incoming   Summary of Call: Pls notify: his hemoccult cards were negative. Initial call taken by: Michell Heinrich M.D.,  June 27, 2010 8:09 AM  Follow-up for Phone Call        LM to [facility] at home number Francee Piccolo CMA Duncan Dull)  June 27, 2010 11:06 AM   Notified of above.  Encouraged pt to continue doing nebulizer treatments Dr. [name] prescribed and we will see him on 07/10/10. Follow-up by: Francee Piccolo CMA (AAMA),  June 27, 2010 12:00 PM

## 2010-07-04 NOTE — Assessment & Plan Note (Addendum)
Summary: rov/kp   Copy to:  [name], [name], Dr. Arbie Cookey Primary Provider/Referring Provider:  Elizebeth Brooking McGowen M.D.  CC:  follow up. Pt states his breathing has been terrible. Pt c/o cough w/ green phlem, wheezing, and hoarseness x couple weeks.  History of Present Illness: 75 yo with known history of COPD, hoarseness, pulmonary nodule and OSA intolerant of CPAP.  He was in the hospital last week for AECOPD and possible PNA, but his chest xray only showed atelectasis.  He was sent home with prednisone and avelox.  He was feeling better over the weekend, but has not started feeling weak again.  He does not have much of an appetite.  He continues to get winded with minimal exertion.  His cough does seem better, and he is not having as much sputum.  Also his phlegm is more clear.  He still gets occasional wheeze.   Current Medications (verified): 1)  Symbicort 160-4.5 Mcg/act  Aero (Budesonide-Formoterol Fumarate) .... 2 Puffs Two Times A Day 2)  Spiriva Handihaler 18 Mcg  Caps (Tiotropium Bromide Monohydrate) .... Use One Inhalation Daily. 3)  Nasonex 50 Mcg/act  Susp (Mometasone Furoate) .... Two Puffs Each Nostril Daily 4)  Zyrtec Allergy 10 Mg Tabs (Cetirizine Hcl) .... Take 1 Tablet By Mouth Once A Day 5)  Adult Aspirin Ec Low Strength 81 Mg  Tbec (Aspirin) .... One Tablet Daily. 6)  Coreg 3.125 Mg Tabs (Carvedilol) .... 1/2 Two Times A Day 7)  Nitro-Dur 0.4 Mg/hr  Pt24 (Nitroglycerin) .... Place Under Tongue As Needed For Chest Pain. 8)  Lasix 40 Mg  Tabs (Furosemide) .... Take 1tablet By Mouth Two Times A Day 9)  Januvia 100 Mg  Tabs (Sitagliptin Phosphate) .... Take One Tablet Daily. 10)  Lithium Carbonate 300 Mg  Caps (Lithium Carbonate) .... Take One Capsule Two Times A Day 11)  Finasteride 5 Mg Tabs (Finasteride) .Marland Kitchen.. 1 By Mouth Daily 12)  Warfarin Sodium 5 Mg Tabs (Warfarin Sodium) .... Take As Directed Per [facility] 13)  Amiodarone Hcl 200 Mg Tabs  (Amiodarone Hcl) .... Take 1 Tablet By Mouth Once A Day 14)  Potassium Chloride Cr 10 Meq Cr-Caps (Potassium Chloride) .... Take One Tablet By Mouth Daily 15)  Guaifenesin Dac 30-10-100 Mg/76ml Soln (Pseudoephedrine-Codeine-Gg) .... As Needed 16)  Acetaminophen 325 Mg  Tabs (Acetaminophen) .... As Needed 17)  Robitussin Dm 100-10 Mg/39ml Syrp (Dextromethorphan-Guaifenesin) .... As Needed For Cough 18)  Levothyroxine Sodium 125 Mcg Tabs (Levothyroxine Sodium) .... Once Daily 19)  Flomax 0.4 Mg Caps (Tamsulosin Hcl) .... Take 1 Tablet By Mouth Once A Day 20)  Xopenex Hfa 45 Mcg/act Aero (Levalbuterol Tartrate) .... Inhale 2 Puffs Four Times A Day As Needed 21)  Lotrisone 1-0.05 % Crea (Clotrimazole-Betamethasone) .... 2 X A Day As Needed 22)  Topicort 0.25 % Crea (Desoximetasone) .... As Needed 23)  Benicar 20 Mg Tabs (Olmesartan Medoxomil) .... Take One-Half Tablet By Mouth Daily 24)  Isosorbide Mononitrate Cr 30 Mg Xr24h-Tab (Isosorbide Mononitrate) .... Take One Tablet By Mouth Once Daily 25)  Cyanocobalamin 1000 Mcg/ml Soln (Cyanocobalamin) .... Once Monthly 26)  Ferrous Sulfate 325 (65 Fe) Mg Tabs (Ferrous Sulfate) .Marland Kitchen.. 1 Tab By Mouth Bid 27)  Avelox 400 Mg Tabs (Moxifloxacin Hcl) .... Once Daily (1 Day Left) 28)  Prednisone 20 Mg Tabs (Prednisone) .... Taper As Directed 29)  Hydromet 5-1.5 Mg/25ml Syrp (Hydrocodone-Homatropine) .... 1/2 To 1 Tsp As Needed  Allergies (verified): 1)  ! Morphine 2)  ! Lipitor  3)  ! * Ambien 4)  ! Prednisone  Past History:  Past Medical History: CAD (ICD-414.00)- post coronary artery bypass graft surgery with prior percutaneos intervention of the saphenous vein graft with known saphenous vein graft disease (cath 10/2009). Descending thoracic/Proximal abdominal aortic aneurysm (largest at the level of the diaphragmatic hiatus, extending to the take-off of the celiac axis)      - 6.0 cm on u/s Nov 2011 Bilateral popliteal artery aneurysm CAD s/p multiple  MI's Ischemic cardiomyopathy with EF 25% (echo 10/2009) Paroxysmal atrial fibrillation (tachy/brady syndrome: has pacemaker and defibrillator) Chronic renal insufficiency (baseline Cr 1.5) HTN Peripheral vascular disease Dyslipidemia Diabetes mellitus GERD Hiatal hernia Diverticular disease BPH Prostate cancer (low grade, dx'd 07/2008--watchful waiting with [name]) COPD      - 05/09/08 PFT FEV1 1.84 (55%), FVC 4.42 (95%), TLC 6.46 (92%), DLCO 72%, no BD response Pulmonary nodule (left base): resolved on f/u CT 03/02/10 Allergic rhinitis Shingles OSA      - PSG 06/14/09 RDI 17, PLMI 96      - Intolerant of CPAP or BPAP  Past Surgical History: Reviewed history from 06/18/2010 and no changes required. Insertion of a new rate sensing pacing lead with  removal of a previous implanted ICD and insertion of device back in the pocket with defibrillation threshold testing.   Partial colectomy 1995 CABG 1996 AAA (infrarenal) repair/graft 1999 Aorto-bifemoral bypass 1999 by [name] Early Implantable cardio defibrillator August 28, 2004 by [name] Stents placed to the saphenous vein graft Radiofrequency ablation (A flutter) in 2006. Nasal surgery  Vital Signs:  Patient profile:   75 year old male Height:      70.5 inches Weight:      167.13 pounds BMI:     23.73 O2 Sat:      92 % on Room air Temp:     97.9 degrees F oral Pulse rate:   64 / minute BP sitting:   88 / 56  (left arm) Cuff size:   regular  Vitals Entered By: Carver Fila (June 25, 2010 3:16 PM)  O2 Flow:  Room air CC: follow up. Pt states his breathing has been terrible. Pt c/o cough w/ green phlem, wheezing, hoarseness x couple weeks Comments meds and allergies updated Phone number updated Carver Fila  June 25, 2010 3:17 PM    Physical Exam  General:  thin.   Nose:  clear nasal discharge.   Mouth:  no deformity or lesions Neck:  no JVD.   Lungs:  decreased breath sounds, no wheezing or  rales Heart:  regular rate and rhythm, S1, S2 without murmurs, rubs, gallops, or clicks Extremities:  No edema Neurologic:  normal CN II-XII.   Cervical Nodes:  no significant adenopathy   Impression & Recommendations:  Problem # 1:  COPD (ICD-496) He is to finish his course of avelox and prednisone.  I am concerned that he may not be able to use his inhalers effectively.  Will have him stop symbicort.  He is to use budesonide and as needed albuterol.  He will continue spiriva.  Advised him to use flutter valve and mucinex as needed.  Problem # 2:  ALLERGIC RHINITIS (ICD-477.9)  He is to continue on his current sinus regimen.  Medications Added to Medication List This Visit: 1)  Budesonide 0.25 Mg/57ml Susp (Budesonide) .... One vial nebulized two times a day 2)  Albuterol Sulfate (2.5 Mg/50ml) 0.083% Nebu (Albuterol sulfate) .... One vial nebulized 3 to 4 times  per day as needed 3)  Coreg 3.125 Mg Tabs (Carvedilol) .... 1/2 two times a day 4)  Levothyroxine Sodium 125 Mcg Tabs (Levothyroxine sodium) .... Once daily 5)  Lotrisone 1-0.05 % Crea (Clotrimazole-betamethasone) .... 2 x a day as needed 6)  Avelox 400 Mg Tabs (Moxifloxacin hcl) .... Once daily (1 day left) 7)  Prednisone 20 Mg Tabs (Prednisone) .... Taper as directed 8)  Hydromet 5-1.5 Mg/64ml Syrp (Hydrocodone-homatropine) .... 1/2 to 1 tsp as needed 9)  Home Nebulizer  .... Use as directed 10)  Flutter Valve  .... Use two times a day  Complete Medication List: 1)  Budesonide 0.25 Mg/83ml Susp (Budesonide) .... One vial nebulized two times a day 2)  Spiriva Handihaler 18 Mcg Caps (Tiotropium bromide monohydrate) .... Use one inhalation daily. 3)  Xopenex Hfa 45 Mcg/act Aero (Levalbuterol tartrate) .... Inhale 2 puffs four times a day as needed 4)  Albuterol Sulfate (2.5 Mg/62ml) 0.083% Nebu (Albuterol sulfate) .... One vial nebulized 3 to 4 times per day as needed 5)  Nasonex 50 Mcg/act Susp (Mometasone furoate) .... Two puffs  each nostril daily 6)  Zyrtec Allergy 10 Mg Tabs (Cetirizine hcl) .... Take 1 tablet by mouth once a day 7)  Adult Aspirin Ec Low Strength 81 Mg Tbec (Aspirin) .... One tablet daily. 8)  Warfarin Sodium 5 Mg Tabs (Warfarin sodium) .... Take as directed per coumadin clinic 9)  Isosorbide Mononitrate Cr 30 Mg Xr24h-tab (Isosorbide mononitrate) .... Take one tablet by mouth once daily 10)  Coreg 3.125 Mg Tabs (Carvedilol) .... 1/2 two times a day 11)  Amiodarone Hcl 200 Mg Tabs (Amiodarone hcl) .... Take 1 tablet by mouth once a day 12)  Benicar 20 Mg Tabs (Olmesartan medoxomil) .... Take one-half tablet by mouth daily 13)  Lasix 40 Mg Tabs (Furosemide) .... Take 1tablet by mouth two times a day 14)  Potassium Chloride Cr 10 Meq Cr-caps (Potassium chloride) .... Take one tablet by mouth daily 15)  Nitro-dur 0.4 Mg/hr Pt24 (Nitroglycerin) .... Place under tongue as needed for chest pain. 16)  Januvia 100 Mg Tabs (Sitagliptin phosphate) .... Take one tablet daily. 17)  Levothyroxine Sodium 125 Mcg Tabs (Levothyroxine sodium) .... Once daily 18)  Lithium Carbonate 300 Mg Caps (Lithium carbonate) .... Take one capsule two times a day 19)  Finasteride 5 Mg Tabs (Finasteride) .Marland Kitchen.. 1 by mouth daily 20)  Flomax 0.4 Mg Caps (Tamsulosin hcl) .... Take 1 tablet by mouth once a day 21)  Acetaminophen 325 Mg Tabs (Acetaminophen) .... As needed 22)  Lotrisone 1-0.05 % Crea (Clotrimazole-betamethasone) .... 2 x a day as needed 23)  Topicort 0.25 % Crea (Desoximetasone) .... As needed 24)  Cyanocobalamin 1000 Mcg/ml Soln (Cyanocobalamin) .... Once monthly 25)  Ferrous Sulfate 325 (65 Fe) Mg Tabs (Ferrous sulfate) .Marland Kitchen.. 1 tab by mouth bid 26)  Avelox 400 Mg Tabs (Moxifloxacin hcl) .... Once daily (1 day left) 27)  Prednisone 20 Mg Tabs (Prednisone) .... Taper as directed 28)  Robitussin Dm 100-10 Mg/42ml Syrp (Dextromethorphan-guaifenesin) .... As needed for cough 29)  Guaifenesin Dac 30-10-100 Mg/41ml Soln  (Pseudoephedrine-codeine-gg) .... As needed 30)  Hydromet 5-1.5 Mg/38ml Syrp (Hydrocodone-homatropine) .... 1/2 to 1 tsp as needed 31)  Home Nebulizer  .... Use as directed 32)  Flutter Valve  .... Use two times a day  Other Orders: Est. Patient Level IV (16109)  Patient Instructions: 1)  Stop symbicort 2)  Budesonide nebulized two times a day, and rinse mouth after using 3)  Albuterol nebulized 3 to 4 times per day as needed 4)  Continue spiriva 5)  Finish prednisone and avelox 6)  Flutter valve two times a day 7)  Mucinex two times a day as needed for cough 8)  Follow up in 2 to 3 months Prescriptions: FLUTTER VALVE use two times a day  #1 x 0   Entered and Authorized by:   Coralyn Helling MD   Signed by:   Coralyn Helling MD on 06/25/2010   Method used:   Print then Give to Patient   RxID:   [id number] HOME NEBULIZER use as directed  #1 x 0   Entered and Authorized by:   Coralyn Helling MD   Signed by:   Coralyn Helling MD on 06/25/2010   Method used:   Print then Give to Patient   RxID:   [id number] ALBUTEROL SULFATE (2.5 MG/3ML) 0.083% NEBU (ALBUTEROL SULFATE) one vial nebulized 3 to 4 times per day as needed  #120 x 6   Entered and Authorized by:   Coralyn Helling MD   Signed by:   Coralyn Helling MD on 06/25/2010   Method used:   Electronically to        [facility]  [address] [phone][facility] (retail)       [address] [place], [place]  [address]       Ph: [phone] or [phone]       Fax: [phone]   RxID:   [id number] BUDESONIDE 0.25 MG/2ML SUSP (BUDESONIDE) one vial nebulized two times a day  #60 x 6   Entered and Authorized by:   Coralyn Helling MD   Signed by:   Coralyn Helling MD on 06/25/2010   Method used:   Electronically to        [facility]  [address] [phone]* (retail)       [address] [place], [place]  [address]       Ph: [phone] or [phone]       Fax: [phone]   RxID:   [id number]

## 2010-07-09 ENCOUNTER — Encounter: Payer: Self-pay | Admitting: Cardiology

## 2010-07-09 ENCOUNTER — Ambulatory Visit (INDEPENDENT_AMBULATORY_CARE_PROVIDER_SITE_OTHER): Payer: Medicare Other | Admitting: Cardiology

## 2010-07-09 ENCOUNTER — Other Ambulatory Visit: Payer: Self-pay | Admitting: Cardiology

## 2010-07-09 DIAGNOSIS — I25810 Atherosclerosis of coronary artery bypass graft(s) without angina pectoris: Secondary | ICD-10-CM

## 2010-07-09 DIAGNOSIS — I10 Essential (primary) hypertension: Secondary | ICD-10-CM

## 2010-07-09 DIAGNOSIS — I2510 Atherosclerotic heart disease of native coronary artery without angina pectoris: Secondary | ICD-10-CM

## 2010-07-09 DIAGNOSIS — I4891 Unspecified atrial fibrillation: Secondary | ICD-10-CM

## 2010-07-10 ENCOUNTER — Ambulatory Visit: Payer: Self-pay | Admitting: Family Medicine

## 2010-07-10 ENCOUNTER — Other Ambulatory Visit: Payer: Medicare Other

## 2010-07-10 ENCOUNTER — Other Ambulatory Visit: Payer: Self-pay | Admitting: Gastroenterology

## 2010-07-10 ENCOUNTER — Encounter: Payer: Self-pay | Admitting: Gastroenterology

## 2010-07-10 ENCOUNTER — Ambulatory Visit (INDEPENDENT_AMBULATORY_CARE_PROVIDER_SITE_OTHER): Payer: Medicare Other | Admitting: Gastroenterology

## 2010-07-10 DIAGNOSIS — K219 Gastro-esophageal reflux disease without esophagitis: Secondary | ICD-10-CM | POA: Insufficient documentation

## 2010-07-10 DIAGNOSIS — K5730 Diverticulosis of large intestine without perforation or abscess without bleeding: Secondary | ICD-10-CM

## 2010-07-10 LAB — BASIC METABOLIC PANEL
Calcium: 9.2 mg/dL (ref 8.4–10.5)
Creatinine, Ser: 1.4 mg/dL (ref 0.4–1.5)
GFR: 54.24 mL/min — ABNORMAL LOW (ref >60.00)
Glucose, Bld: 104 mg/dL — ABNORMAL HIGH (ref 70–99)
Sodium: 142 mEq/L (ref 135–145)

## 2010-07-10 LAB — CBC WITH DIFFERENTIAL/PLATELET
Basophils Absolute: 0 10*3/uL (ref 0.0–0.1)
Eosinophils Absolute: 0.1 10*3/uL (ref 0.0–0.7)
Hemoglobin: 12 g/dL — ABNORMAL LOW (ref 13.0–17.0)
Lymphocytes Relative: 20.6 % (ref 12.0–46.0)
Monocytes Relative: 7.6 % (ref 3.0–12.0)
Neutro Abs: 4.3 10*3/uL (ref 1.4–7.7)
Neutrophils Relative %: 69.3 % (ref 43.0–77.0)
RBC: 3.99 Mil/uL — ABNORMAL LOW (ref 4.22–5.81)
RDW: 17.5 % — ABNORMAL HIGH (ref 11.5–14.6)

## 2010-07-10 LAB — IBC PANEL: Iron: 123 ug/dL (ref 42–165)

## 2010-07-10 LAB — FOLATE: Folate: 11.1 ng/mL (ref >5.9)

## 2010-07-10 LAB — FERRITIN: Ferritin: 51.8 ng/mL (ref 22.0–322.0)

## 2010-07-10 NOTE — Cardiovascular Report (Signed)
Summary: Health Management Program Summary Report   Health Management Program Summary Report   Imported By: Roderic Ovens 06/25/2010 15:26:56  _____________________________________________________________________  External Attachment:    Type:   Image     Comment:   External Document

## 2010-07-11 ENCOUNTER — Telehealth (INDEPENDENT_AMBULATORY_CARE_PROVIDER_SITE_OTHER): Payer: Self-pay | Admitting: *Deleted

## 2010-07-17 ENCOUNTER — Telehealth: Payer: Self-pay | Admitting: Pulmonary Disease

## 2010-07-18 ENCOUNTER — Telehealth: Payer: Self-pay | Admitting: Cardiology

## 2010-07-19 ENCOUNTER — Encounter: Payer: Self-pay | Admitting: Internal Medicine

## 2010-07-19 ENCOUNTER — Encounter: Payer: Self-pay | Admitting: Cardiology

## 2010-07-19 ENCOUNTER — Encounter (INDEPENDENT_AMBULATORY_CARE_PROVIDER_SITE_OTHER): Payer: Medicare Other

## 2010-07-19 DIAGNOSIS — I4891 Unspecified atrial fibrillation: Secondary | ICD-10-CM

## 2010-07-19 DIAGNOSIS — Z7901 Long term (current) use of anticoagulants: Secondary | ICD-10-CM

## 2010-07-19 NOTE — Letter (Signed)
Summary: [facility]  [facility]   Imported By: Marylou Mccoy 07/12/2010 12:00:16  _____________________________________________________________________  External Attachment:    Type:   Image     Comment:   External Document

## 2010-07-19 NOTE — Progress Notes (Signed)
  Phone Note From Other Clinic   Summary of Call: Pharmacy called stating pts Lithium RX from [facility] was ER. Per Dr Abner Greenspan its okay to fill it that way. will update med list Initial call taken by: Josph Macho RMA,  July 11, 2010 3:47 PM    New/Updated Medications: LITHIUM CARBONATE 300 MG CR-TABS (LITHIUM CARBONATE) 1 capsule two times a day

## 2010-07-19 NOTE — Progress Notes (Signed)
Summary: Lithium Carbonate Refill  Phone Note Refill Request Message from:  Fax from Pharmacy on July 11, 2010 8:14 AM  Refills Requested: Medication #1:  LITHIUM CARBONATE 300 MG  CAPS Take One capsule two times a day   Brand Name Necessary? No   Supply Requested: 1 month  Method Requested: Electronic Next Appointment Scheduled: 07/20/10 Initial call taken by: Lannette Donath,  July 11, 2010 8:15 AM    Prescriptions: LITHIUM CARBONATE 300 MG  CAPS (LITHIUM CARBONATE) Take One capsule two times a day  #60 x 6   Entered by:   Francee Piccolo CMA (AAMA)   Authorized by:   Michell Heinrich M.D.   Signed by:   Francee Piccolo CMA (AAMA) on 07/11/2010   Method used:   [facility] to        [facility]  [address] [phone]       [address]       [place], [place]  [address]       Ph: [phone] or [phone]       Fax: [phone]   RxID:   [id number]

## 2010-07-19 NOTE — Assessment & Plan Note (Signed)
Summary: iron def anemia...sch w vanessa [facility]care-aarp ins copay and cx f...   History of Present Illness Visit Type: Initial Consult Primary GI MD: Sheryn Bison MD FACP FAGA Primary Provider: Elizebeth Brooking McGowen M.D. Requesting Provider: Windy Kalata, MD Chief Complaint: Colon screening- anemia, last colon in 1995 History of Present Illness:   Extremely, extremely complicated but very pleasant 75 year old Caucasian male with severe emphysema, hyperlipidemia with ASCVD, peripheral vascular disease, ischemic cardiomyopathy, chronic atrial fibrillation requiring pacemaker and defibrillator, mild chronic renal insufficiency, chronically anticoagulated, diabetic, nose and prostate cancer being followed by Dr. Shiela Mayer, pulmonary nodules, previous coronary artery bypass surgery, stent placement, and repair of popliteal aneurysms the thoracic aortic aneurysm.  He is referred today for evaluation of a history of chronic iron deficiency anemia with guaiac negative stools on Hemoccult testing. He apparently had colonoscopy and endoscopy in 1995 at [facility], and allegedly these were normal. He did have CT scan of his abdomen performed one year ago that was unremarkable except for diverticulosis.  Other medical problems include chronic thyroid dysfunction, daily lithium 3 mg twice a day, ongoing angina, chronic anticoagulation with aspirin, Coumadin,. He also has COPD and uses various inhalers.  He denies irregular bowel habits, abdominal pain, melena, hematochezia, any history of hepatobiliary problems or pancreatitis or hepatitis, acid reflux or dysphagia. He does take daily Nexium because of GERD. He denies abuse of NSAIDs but does take aspirin. Family history is noncontributory.  When asked how his cardiopulmonary status is doing, he relates sometimes good, sometimes bad. Obviously he has very fragile medical status and is followed closely by Dr. Tedra Senegal in cardiology and Dr.Philip McGowen in primary  care. He is on oral iron replacement therapy.Hemoglobin Is Stable at 12.0.    GI Review of Systems      Denies abdominal pain, acid reflux, belching, bloating, chest pain, dysphagia with liquids, dysphagia with solids, heartburn, loss of appetite, nausea, vomiting, vomiting blood, weight loss, and  weight gain.        Denies anal fissure, black tarry stools, change in bowel habit, constipation, diarrhea, diverticulosis, fecal incontinence, heme positive stool, hemorrhoids, irritable bowel syndrome, jaundice, light color stool, liver problems, rectal bleeding, and  rectal pain.    Current Medications (verified): 1)  Budesonide 0.25 Mg/65ml Susp (Budesonide) .... One Vial Nebulized Two Times A Day 2)  Spiriva Handihaler 18 Mcg  Caps (Tiotropium Bromide Monohydrate) .... Use One Inhalation Daily. 3)  Xopenex Hfa 45 Mcg/act Aero (Levalbuterol Tartrate) .... Inhale 2 Puffs Four Times A Day As Needed 4)  Albuterol Sulfate (2.5 Mg/78ml) 0.083% Nebu (Albuterol Sulfate) .... One Vial Nebulized 3 To 4 Times Per Day As Needed 5)  Nasonex 50 Mcg/act  Susp (Mometasone Furoate) .... Two Puffs Each Nostril Daily 6)  Zyrtec Allergy 10 Mg Tabs (Cetirizine Hcl) .... Take 1 Tablet By Mouth Once A Day 7)  Adult Aspirin Ec Low Strength 81 Mg  Tbec (Aspirin) .... One Tablet Daily. 8)  Warfarin Sodium 5 Mg Tabs (Warfarin Sodium) .... Take As Directed Per [facility] 9)  Isosorbide Mononitrate Cr 30 Mg Xr24h-Tab (Isosorbide Mononitrate) .... Take One Tablet By Mouth Once Daily 10)  Coreg 3.125 Mg Tabs (Carvedilol) .... 1/2 Two Times A Day 11)  Amiodarone Hcl 200 Mg Tabs (Amiodarone Hcl) .... Take 1 Tablet By Mouth Once A Day 12)  Benicar 20 Mg Tabs (Olmesartan Medoxomil) .... Take One-Half Tablet By Mouth Daily 13)  Lasix 40 Mg  Tabs (Furosemide) .... Take 1tablet By Mouth Two Times  A Day 14)  Potassium Chloride Cr 10 Meq Cr-Caps (Potassium Chloride) .... Take One Tablet By Mouth Daily 15)  Nitro-Dur 0.4  Mg/hr  Pt24 (Nitroglycerin) .... Place Under Tongue As Needed For Chest Pain. 16)  Januvia 100 Mg  Tabs (Sitagliptin Phosphate) .... Take One Tablet Daily. 17)  Levothyroxine Sodium 125 Mcg Tabs (Levothyroxine Sodium) .... Once Daily 18)  Lithium Carbonate 300 Mg  Caps (Lithium Carbonate) .... Take One Capsule Two Times A Day 19)  Finasteride 5 Mg Tabs (Finasteride) .Marland Kitchen.. 1 By Mouth Daily 20)  Flomax 0.4 Mg Caps (Tamsulosin Hcl) .... Take 1 Tablet By Mouth Once A Day 21)  Acetaminophen 325 Mg  Tabs (Acetaminophen) .... As Needed 22)  Lotrisone 1-0.05 % Crea (Clotrimazole-Betamethasone) .... 2 X A Day As Needed 23)  Topicort 0.25 % Crea (Desoximetasone) .... As Needed 24)  Cyanocobalamin 1000 Mcg/ml Soln (Cyanocobalamin) .... Once Monthly 25)  Ferrous Sulfate 325 (65 Fe) Mg Tabs (Ferrous Sulfate) .Marland Kitchen.. 1 Tab By Mouth Bid 26)  Robitussin Dm 100-10 Mg/34ml Syrp (Dextromethorphan-Guaifenesin) .... As Needed For Cough 27)  Guaifenesin Dac 30-10-100 Mg/63ml Soln (Pseudoephedrine-Codeine-Gg) .... As Needed 28)  Hydromet 5-1.5 Mg/30ml Syrp (Hydrocodone-Homatropine) .... 1/2 To 1 Tsp As Needed 29)  Home Nebulizer .... Use As Directed 30)  Flutter Valve .... Use Two Times A Day  Allergies (verified): 1)  ! Morphine 2)  ! Lipitor 3)  ! * Ambien 4)  ! Prednisone  Past History:  Past medical, surgical, family and social histories (including risk factors) reviewed for relevance to current acute and chronic problems.  Past Medical History: Reviewed history from 06/25/2010 and no changes required. CAD (ICD-414.00)- post coronary artery bypass graft surgery with prior percutaneos intervention of the saphenous vein graft with known saphenous vein graft disease (cath 10/2009). Descending thoracic/Proximal abdominal aortic aneurysm (largest at the level of the diaphragmatic hiatus, extending to the take-off of the celiac axis)      - 6.0 cm on u/s Nov 2011 Bilateral popliteal artery aneurysm CAD s/p multiple  MI's Ischemic cardiomyopathy with EF 25% (echo 10/2009) Paroxysmal atrial fibrillation (tachy/brady syndrome: has pacemaker and defibrillator) Chronic renal insufficiency (baseline Cr 1.5) HTN Peripheral vascular disease Dyslipidemia Diabetes mellitus GERD Hiatal hernia Diverticular disease BPH Prostate cancer (low grade, dx'd 07/2008--watchful waiting with [name]) COPD      - 05/09/08 PFT FEV1 1.84 (55%), FVC 4.42 (95%), TLC 6.46 (92%), DLCO 72%, no BD response Pulmonary nodule (left base): resolved on f/u CT 03/02/10 Allergic rhinitis Shingles OSA      - PSG 06/14/09 RDI 17, PLMI 96      - Intolerant of CPAP or BPAP  Past Surgical History: Reviewed history from 06/18/2010 and no changes required. Insertion of a new rate sensing pacing lead with  removal of a previous implanted ICD and insertion of device back in the pocket with defibrillation threshold testing.   Partial colectomy 1995 CABG 1996 AAA (infrarenal) repair/graft 1999 Aorto-bifemoral bypass 1999 by [name] Early Implantable cardio defibrillator August 28, 2004 by [name] Stents placed to the saphenous vein graft Radiofrequency ablation (A flutter) in 2006. Nasal surgery  Family History: Reviewed history from 09/02/2008 and no changes required.  Mother died in her 8s of CVA.  Father died at 70 of   lung cancer, MI in his 3s.  He has 5 siblings, 2 of which has CAD.   Social History: Reviewed history from 12/27/2008 and no changes required.  Lives in [place] with his wife.  He is a retired   Curator.  He has an 80-pack-year history of tobacco abuse, quitting 2005  He rarely has a beer.  He denies drug use.  He plays golf   twice a week, but rides the cart to the ball, hits it, and then gets   back in the cart.      Review of Systems       The patient complains of allergy/sinus, back pain, fatigue, hearing problems, heart rhythm changes, shortness of breath, swelling of feet/legs, and  voice change.  The patient denies anemia, anxiety-new, arthritis/joint pain, blood in urine, breast changes/lumps, change in vision, confusion, cough, coughing up blood, depression-new, fainting, fever, headaches-new, heart murmur, itching, menstrual pain, muscle pains/cramps, night sweats, nosebleeds, pregnancy symptoms, skin rash, sleeping problems, sore throat, swollen lymph glands, thirst - excessive , urination - excessive , urination changes/pain, urine leakage, and vision changes.    Vital Signs:  Patient profile:   75 year old male Height:      70.5 inches Weight:      168 pounds BMI:     23.85 Pulse rate:   60 / minute Pulse rhythm:   regular BP sitting:   114 / 68  (left arm) Cuff size:   regular  Vitals Entered By: June McMurray CMA Duncan Dull) (July 10, 2010 2:43 PM)  Physical Exam  General:  Well developed, well nourished, no acute distress.healthy appearing.  Surprisingly he does not appear chronically ill or in any acute distress. Head:  Normocephalic and atraumatic. Eyes:  PERRLA, no icterus.exam deferred to patient's ophthalmologist.   Neck:  Supple; no masses or thyromegaly. Lungs:  Clear throughout to auscultation.decreased BS on L and decreased BS on R.   Heart:  Regular rate and rhythm; no murmurs, rubs,  or bruits.I cannot appreciate an irregular pulse, murmurs gallops or rubs. Abdomen:  Soft, nontender and nondistended. No masses, hepatosplenomegaly or hernias noted. Normal bowel sounds. Msk:  Symmetrical with no gross deformities. Normal posture. Extremities:  trace pedal edema.   Neurologic:  Alert and  oriented x4;  grossly normal neurologically. Psych:  Alert and cooperative. Normal mood and affect.   Impression & Recommendations:  Problem # 1:  DIVERTICULOSIS, COLON (ICD-562.10) Assessment Unchanged CT scan reviewed and does show diverticulosis without other abnormalities. I have urged him to continue a high-fiber diet as tolerated. I do not think he is a  candidate for colonoscopy, and do not see any real benefit of CT colonoscopy at this time. His hemoglobin is stable and his Hemoccult cards have been negative, he has no GI symptoms, his family history is negative. I have ordered an anemia profile, and we will advise him about his iron replacement therapy. I would urge cautious observation with this patient who has multiple, multiple comorbid medical problems. Orders: TLB-B12, Serum-Total ONLY (16109-U04) TLB-Ferritin (82728-FER) TLB-Folic Acid (Folate) (82746-FOL) TLB-IBC Pnl (Iron/FE;Transferrin) (83550-IBC)  Problem # 2:  PROSTATE CANCER (ICD-185) Assessment: Unchanged continued followup with [name] as planned  Problem # 3:  IRON DEFICIENCY (ICD-280.9) Assessment: Improved repeat iron levels, B12 and folate ordered.  Problem # 4:  PERNICIOUS ANEMIA (ICD-281.0) Assessment: Unchanged ??? diagnosis. [facility]  Problem # 5:  CAD, ARTERY BYPASS GRAFT (ICD-414.04) Assessment: Unchanged continue multiple medications per Dr. Tedra Senegal. As per my note this patient has ischemic cardiomyopathy, atrial fibrillation, is on anticoagulants, and has a pacemaker and defibrillator. He seemed to be doing well currently from a cardiovascular and pulmonary  standpoint. However, his status is tenuous.  Problem # 6:  IMPLANTATION OF DEFIBRILLATOR, HX OF (ICD-V45.02) Assessment: Comment Only  Problem # 7:  ISCHEMIC CARDIOMYOPATHY (ICD-414.8) Assessment: Comment Only  Problem # 8:  COPD (ICD-496) Assessment: Comment Only  Problem # 9:  GERD (ICD-530.81) Assessment: Improved Continued Nexium 40 mg a day as tolerated.    Patient Instructions: 1)  Copy sent to : Michell Heinrich M.D. and Dr. [name] 2)  Diverticular Disease brochure given.  3)  Today you signed a release for our office to get your [facility] records.  4)  High Fiber, Low Fat  Healthy Eating Plan brochure given.  5)  Please  go to the basement today for your labs.  6)  The medication list was reviewed and reconciled.  All changed / newly prescribed medications were explained.  A complete medication list was provided to the patient / caregiver.

## 2010-07-20 ENCOUNTER — Encounter: Payer: Self-pay | Admitting: Family Medicine

## 2010-07-20 ENCOUNTER — Ambulatory Visit: Payer: Medicare Other | Admitting: Cardiology

## 2010-07-20 ENCOUNTER — Telehealth: Payer: Self-pay | Admitting: Family Medicine

## 2010-07-20 ENCOUNTER — Other Ambulatory Visit: Payer: Self-pay | Admitting: Family Medicine

## 2010-07-20 ENCOUNTER — Ambulatory Visit (INDEPENDENT_AMBULATORY_CARE_PROVIDER_SITE_OTHER): Payer: Medicare Other | Admitting: Family Medicine

## 2010-07-20 DIAGNOSIS — E119 Type 2 diabetes mellitus without complications: Secondary | ICD-10-CM

## 2010-07-20 DIAGNOSIS — D510 Vitamin B12 deficiency anemia due to intrinsic factor deficiency: Secondary | ICD-10-CM

## 2010-07-20 DIAGNOSIS — I4891 Unspecified atrial fibrillation: Secondary | ICD-10-CM

## 2010-07-20 DIAGNOSIS — J449 Chronic obstructive pulmonary disease, unspecified: Secondary | ICD-10-CM

## 2010-07-20 DIAGNOSIS — E039 Hypothyroidism, unspecified: Secondary | ICD-10-CM

## 2010-07-21 LAB — MICROALBUMIN / CREATININE URINE RATIO: Microalb, Ur: 0.5 mg/dL (ref 0.00–1.89)

## 2010-07-21 LAB — CONVERTED CEMR LAB: Creatinine, Urine: 63.3 mg/dL

## 2010-07-23 ENCOUNTER — Encounter: Payer: Self-pay | Admitting: Family Medicine

## 2010-07-24 NOTE — Cardiovascular Report (Signed)
Summary: Health Management Program Summary Report   Health Management Program Summary Report   Imported By: Roderic Ovens 07/19/2010 15:18:11  _____________________________________________________________________  External Attachment:    Type:   Image     Comment:   External Document

## 2010-07-24 NOTE — Progress Notes (Signed)
Summary: --neb med question-  Phone Note Call from Patient Call back at Home Phone [phone]   Caller: Patient Call For: Bell Cai Summary of Call: Patient has a question about nebulizer meds.  Budesonide and albuterol--he wants to know if he can mix meds. Initial call taken by: Lehman Prom,  July 17, 2010 10:50 AM  Follow-up for Phone Call        ATCx2, line busy. Going to advise pt that it is ok to mix budesionide and albuterol. Carron Curie CMA  July 17, 2010 3:23 PM  pt aware ok to mix. Carron Curie CMA  July 17, 2010 5:28 PM

## 2010-07-24 NOTE — Progress Notes (Signed)
Summary: lab results  Phone Note Call from Patient Call back at Home Phone [phone]   Caller: Patient [phone] Reason for Call: Talk to Nurse Summary of Call: rtn called from lauren  Initial call taken by: Lorne Skeens,  July 18, 2010 11:43 AM  Follow-up for Phone Call        advised of lab results per Dr. Riley Kill.  Hgb slightly low but MCV normal.  To have repeat CBC in one month. Follow-up by: Dessie Coma  LPN,  July 18, 2010 12:02 PM

## 2010-07-24 NOTE — Medication Information (Signed)
Summary: rov/tm,  Anticoagulant Therapy  Managed by: Cloyde Reams, RN, BSN Referring MD: [name] MD PCP: Elizebeth Brooking McGowen M.D. Supervising MD: Ladona Ridgel MD, Sharlot Gowda Indication 1: Atrial Fibrillation (ICD-427.31) Lab Used: [facility] Site: [facility] INR POC 2.8 INR RANGE 2 - 3  Dietary changes: no    Health status changes: no    Bleeding/hemorrhagic complications: no    Recent/future hospitalizations: no    Any changes in medication regimen? no    Recent/future dental: no  Any missed doses?: no       Is patient compliant with meds? yes       Allergies: 1)  ! Morphine 2)  ! Lipitor 3)  ! * Ambien 4)  ! Prednisone  Anticoagulation Management History:      The patient is taking warfarin and comes in today for a routine follow up visit.  Positive risk factors for bleeding include an age of 75 years or older and presence of serious comorbidities.  The bleeding index is 'intermediate risk'.  Positive CHADS2 values include History of CHF, History of HTN, and History of Diabetes.  Negative CHADS2 values include Age > 65 years old.  The start date was 05/29/2007.  His last INR was 8.4 ratio.  Anticoagulation responsible provider: Ladona Ridgel MD, Sharlot Gowda.  INR POC: 2.8.  Cuvette Lot#: [id number].  Exp: 07/2011.    Anticoagulation Management Assessment/Plan:      The patient's current anticoagulation dose is Warfarin sodium 5 mg tabs: take as directed per coumadin clinic.  The target INR is 2 - 3.  The next INR is due 08/16/2010.  Anticoagulation instructions were given to patient.  Results were reviewed/authorized by Cloyde Reams, RN, BSN.  He was notified by Cloyde Reams RN.         Prior Anticoagulation Instructions: INR 2.7  Begin taking 1/2 tablet everyday except 1 tablet on Fridays. Recheck INR in 3 weeks.    Current Anticoagulation Instructions: INR 2.8  Continue on same dosage 1/2 tablet daily except 1 tablet on Fridays.  Recheck in 4 weeks.

## 2010-07-24 NOTE — Assessment & Plan Note (Signed)
Vital Signs:  Patient profile:   75 year old male Height:      70.5 inches Weight:      168 pounds BMI:     23.85 O2 Sat:      98 % on Room air Pulse rate:   57 / minute BP sitting:   137 / 73  (right arm) Cuff size:   regular  Vitals Entered By: Francee Piccolo CMA Duncan Dull) (July 20, 2010 9:09 AM)  O2 Flow:  Room air CC: 2 month follow up, feeling well, needs B12 injection////SP Is Patient Diabetic? Yes   History of Present Illness: 75 y/o WM here for routine f/u: DM 2, hypothyroidism, a-fib, COPD. Feeling pretty well, no acute complaints. Glucoses around 100 consistently, rarely up to 130. Energy level stable.  Denies SOB, CP, diaphoresis, palpitations, or LE edema.   Taking the budesonide two times a day and hasn't been needing the albuterol nebs much at all lately.  Reviewed recent GI visit: no colonoscopy recommended.  Cautious observation recommended by [name]. Recent visit with cardiology went well. Coumadin clinic visit yesterday: INR 2.8, same dose recommended, recheck 75mo.   Current Medications (verified): 1)  Budesonide 0.25 Mg/15ml Susp (Budesonide) .... One Vial Nebulized Two Times A Day 2)  Spiriva Handihaler 18 Mcg  Caps (Tiotropium Bromide Monohydrate) .... Use One Inhalation Daily. 3)  Xopenex Hfa 45 Mcg/act Aero (Levalbuterol Tartrate) .... Inhale 2 Puffs Four Times A Day As Needed 4)  Albuterol Sulfate (2.5 Mg/102ml) 0.083% Nebu (Albuterol Sulfate) .... One Vial Nebulized 3 To 4 Times Per Day As Needed 5)  Nasonex 50 Mcg/act  Susp (Mometasone Furoate) .... Two Puffs Each Nostril Daily 6)  Zyrtec Allergy 10 Mg Tabs (Cetirizine Hcl) .... Take 1 Tablet By Mouth Once A Day 7)  Adult Aspirin Ec Low Strength 81 Mg  Tbec (Aspirin) .... One Tablet Daily. 8)  Warfarin Sodium 5 Mg Tabs (Warfarin Sodium) .... Take As Directed Per Coumadin Clinic 9)  Isosorbide Mononitrate Cr 30 Mg Xr24h-Tab (Isosorbide Mononitrate) .... Take One Tablet By Mouth Once Daily 10)   Coreg 3.125 Mg Tabs (Carvedilol) .... 1/2 Two Times A Day 11)  Amiodarone Hcl 200 Mg Tabs (Amiodarone Hcl) .... Take 1 Tablet By Mouth Once A Day 12)  Benicar 20 Mg Tabs (Olmesartan Medoxomil) .... Take One-Half Tablet By Mouth Daily 13)  Lasix 40 Mg  Tabs (Furosemide) .... Take 1tablet By Mouth Two Times A Day 14)  Potassium Chloride Cr 10 Meq Cr-Caps (Potassium Chloride) .... Take One Tablet By Mouth Daily 15)  Nitro-Dur 0.4 Mg/hr  Pt24 (Nitroglycerin) .... Place Under Tongue As Needed For Chest Pain. 16)  Januvia 100 Mg  Tabs (Sitagliptin Phosphate) .... Take One Tablet Daily. 17)  Levothyroxine Sodium 125 Mcg Tabs (Levothyroxine Sodium) .... Once Daily 18)  Lithium Carbonate 300 Mg Cr-Tabs (Lithium Carbonate) .Marland Kitchen.. 1 Capsule Two Times A Day 19)  Finasteride 5 Mg Tabs (Finasteride) .Marland Kitchen.. 1 By Mouth Daily 20)  Flomax 0.4 Mg Caps (Tamsulosin Hcl) .... Take 1 Tablet By Mouth Once A Day 21)  Acetaminophen 325 Mg  Tabs (Acetaminophen) .... As Needed 22)  Lotrisone 1-0.05 % Crea (Clotrimazole-Betamethasone) .... 2 X A Day As Needed 23)  Topicort 0.25 % Crea (Desoximetasone) .... As Needed 24)  Cyanocobalamin 1000 Mcg/ml Soln (Cyanocobalamin) .... Once Monthly 25)  Ferrous Sulfate 325 (65 Fe) Mg Tabs (Ferrous Sulfate) .Marland Kitchen.. 1 Tab By Mouth Bid 26)  Robitussin Dm 100-10 Mg/69ml Syrp (Dextromethorphan-Guaifenesin) .... As Needed  For Cough 27)  Guaifenesin Dac 30-10-100 Mg/34ml Soln (Pseudoephedrine-Codeine-Gg) .... As Needed 28)  Hydromet 5-1.5 Mg/71ml Syrp (Hydrocodone-Homatropine) .... 1/2 To 1 Tsp As Needed 29)  Home Nebulizer .... Use As Directed 30)  Flutter Valve .... Use Two Times A Day  Allergies (verified): 1)  ! Morphine 2)  ! Lipitor 3)  ! * Ambien 4)  ! Prednisone  Past History:  Past Medical History: Reviewed history from 06/25/2010 and no changes required. CAD (ICD-414.00)- post coronary artery bypass graft surgery with prior percutaneos intervention of the saphenous vein graft with  known saphenous vein graft disease (cath 10/2009). Descending thoracic/Proximal abdominal aortic aneurysm (largest at the level of the diaphragmatic hiatus, extending to the take-off of the celiac axis)      - 6.0 cm on u/s Nov 2011 Bilateral popliteal artery aneurysm CAD s/p multiple MI's Ischemic cardiomyopathy with EF 25% (echo 10/2009) Paroxysmal atrial fibrillation (tachy/brady syndrome: has pacemaker and defibrillator) Chronic renal insufficiency (baseline Cr 1.5) HTN Peripheral vascular disease Dyslipidemia Diabetes mellitus GERD Hiatal hernia Diverticular disease BPH Prostate cancer (low grade, dx'd 07/2008--watchful waiting with [name]) COPD      - 05/09/08 PFT FEV1 1.84 (55%), FVC 4.42 (95%), TLC 6.46 (92%), DLCO 72%, no BD response Pulmonary nodule (left base): resolved on f/u CT 03/02/10 Allergic rhinitis Shingles OSA      - PSG 06/14/09 RDI 17, PLMI 96      - Intolerant of CPAP or BPAP  Past Surgical History: Reviewed history from 06/18/2010 and no changes required. Insertion of a new rate sensing pacing lead with  removal of a previous implanted ICD and insertion of device back in the pocket with defibrillation threshold testing.   Partial colectomy 1995 CABG 1996 AAA (infrarenal) repair/graft 1999 Aorto-bifemoral bypass 1999 by [name] Early Implantable cardio defibrillator August 28, 2004 by [name] Stents placed to the saphenous vein graft Radiofrequency ablation (A flutter) in 2006. Nasal surgery  Social History: Reviewed history from 12/27/2008 and no changes required.  Lives in [place] with his wife.  He is a retired   Curator.  He has an 80-pack-year history of tobacco abuse, quitting 2005  He rarely has a beer.  He denies drug use.  He plays golf   twice a week, but rides the cart to the ball, hits it, and then gets   back in the cart.      Review of Systems       See HPI  Physical Exam  General:  VS: noted, all normal. Gen:  Alert, well appearing, oriented x 4. HEENT: Scalp without lesions or hair loss.  Ears: EACs clear, normal epithelium.  TMs with good light reflex and landmarks bilaterally.  Eyes: no injection, icteris, swelling, or exudate.  EOMI, PERRLA. Nose: no drainage or turbinate edema/swelling.  No injection or focal lesion.  Mouth: lips without lesion/swelling.  Oral mucosa pink and moist.  Dentition intact and without obvious caries or gingival swelling.  Oropharynx without erythema, exudate, or swelling.  Neck: supple.  No lymphadenopathy, thyromegaly, or mass.  Carotids 2+, no bruits. Chest: symmetric expansion, with nonlabored respirations.  Clear and equal breath sounds in all lung fields.   CV: RRR, no m/r/g.  Peripheral pulses 2+/symmetric. EXT: no clubbing, cyanosis, or edema.     Impression & Recommendations:  Problem # 1:  IRON DEFICIENCY (ICD-280.9) Assessment Improved Following Hb, which has been stable around 12.  He is on oral iron replacement and GI has seen him and  recommend cautious obs (hemoccults neg x 3). Recheck Hb 96mo, either here or at Dr. Rosalyn Charters.  His updated medication list for this problem includes:    Cyanocobalamin 1000 Mcg/ml Soln (Cyanocobalamin) ..... Once monthly    Ferrous Sulfate 325 (65 Fe) Mg Tabs (Ferrous sulfate) .Marland Kitchen... 1 tab by mouth bid  Problem # 2:  PERNICIOUS ANEMIA (ICD-281.0) Assessment: Unchanged Vit B12 1000 micrograms IM today and monthly.  His updated medication list for this problem includes:    Cyanocobalamin 1000 Mcg/ml Soln (Cyanocobalamin) ..... Once monthly    Ferrous Sulfate 325 (65 Fe) Mg Tabs (Ferrous sulfate) .Marland Kitchen... 1 tab by mouth bid  Orders: Vit B12 1000 mcg (J3420) Admin of Therapeutic Inj  intramuscular or subcutaneous (29528)  Problem # 3:  HYPOTHYROIDISM (ICD-244.9) Assessment: Unchanged He's due for recheck of this at next blood draw in 96mo (of note, his med list is not accurate--he actually takes 1 of the tabs  daily alternating with 1/2 of the tabs daily).  Problem # 4:  ATRIAL FIBRILLATION (ICD-427.31) Assessment: Improved Stable.  Continue current meds. His updated medication list for this problem includes:    Adult Aspirin Ec Low Strength 81 Mg Tbec (Aspirin) ..... One tablet daily.    Warfarin Sodium 5 Mg Tabs (Warfarin sodium) .Marland Kitchen... Take as directed per coumadin clinic    Coreg 3.125 Mg Tabs (Carvedilol) .Marland Kitchen... 1/2 two times a day    Amiodarone Hcl 200 Mg Tabs (Amiodarone hcl) .Marland Kitchen... Take 1 tablet by mouth once a day  Problem # 5:  COPD (ICD-496) Assessment: Improved Stable.  Continue current meds.  His updated medication list for this problem includes:    Budesonide 0.25 Mg/54ml Susp (Budesonide) ..... One vial nebulized two times a day    Spiriva Handihaler 18 Mcg Caps (Tiotropium bromide monohydrate) ..... Use one inhalation daily.    Xopenex Hfa 45 Mcg/act Aero (Levalbuterol tartrate) ..... Inhale 2 puffs four times a day as needed    Albuterol Sulfate (2.5 Mg/4ml) 0.083% Nebu (Albuterol sulfate) ..... One vial nebulized 3 to 4 times per day as needed  Problem # 6:  DIABETES MELLITUS, TYPE II (ICD-250.00) Assessment: Unchanged Will ask for old records from Dr. Jerelene Redden office and try to clarify last D.R screening exam--pt reports he goes twice per year to Dr. Elmer Picker for screening and all has been fine. Will do HbA1c with next lab draw in 96mo. Sent urine microalb/cr today. Continue once daily glucose checks.  His updated medication list for this problem includes:    Adult Aspirin Ec Low Strength 81 Mg Tbec (Aspirin) ..... One tablet daily.    Benicar 20 Mg Tabs (Olmesartan medoxomil) .Marland Kitchen... Take one-half tablet by mouth daily    Januvia 100 Mg Tabs (Sitagliptin phosphate) .Marland Kitchen... Take one tablet daily.  Orders: TLB-Microalbumin/Creat Ratio, Urine (82043-MALB)  Complete Medication List: 1)  Budesonide 0.25 Mg/40ml Susp (Budesonide) .... One vial nebulized two times a day 2)   Spiriva Handihaler 18 Mcg Caps (Tiotropium bromide monohydrate) .... Use one inhalation daily. 3)  Xopenex Hfa 45 Mcg/act Aero (Levalbuterol tartrate) .... Inhale 2 puffs four times a day as needed 4)  Albuterol Sulfate (2.5 Mg/59ml) 0.083% Nebu (Albuterol sulfate) .... One vial nebulized 3 to 4 times per day as needed 5)  Nasonex 50 Mcg/act Susp (Mometasone furoate) .... Two puffs each nostril daily 6)  Zyrtec Allergy 10 Mg Tabs (Cetirizine hcl) .... Take 1 tablet by mouth once a day 7)  Adult Aspirin Ec Low Strength 81 Mg  Tbec (Aspirin) .... One tablet daily. 8)  Warfarin Sodium 5 Mg Tabs (Warfarin sodium) .... Take as directed per coumadin clinic 9)  Isosorbide Mononitrate Cr 30 Mg Xr24h-tab (Isosorbide mononitrate) .... Take one tablet by mouth once daily 10)  Coreg 3.125 Mg Tabs (Carvedilol) .... 1/2 two times a day 11)  Amiodarone Hcl 200 Mg Tabs (Amiodarone hcl) .... Take 1 tablet by mouth once a day 12)  Benicar 20 Mg Tabs (Olmesartan medoxomil) .... Take one-half tablet by mouth daily 13)  Lasix 40 Mg Tabs (Furosemide) .... Take 1tablet by mouth two times a day 14)  Potassium Chloride Cr 10 Meq Cr-caps (Potassium chloride) .... Take one tablet by mouth daily 15)  Nitro-dur 0.4 Mg/hr Pt24 (Nitroglycerin) .... Place under tongue as needed for chest pain. 16)  Januvia 100 Mg Tabs (Sitagliptin phosphate) .... Take one tablet daily. 17)  Levothyroxine Sodium 125 Mcg Tabs (Levothyroxine sodium) .... Once daily 18)  Lithium Carbonate 300 Mg Cr-tabs (Lithium carbonate) .Marland Kitchen.. 1 capsule two times a day 19)  Finasteride 5 Mg Tabs (Finasteride) .Marland Kitchen.. 1 by mouth daily 20)  Flomax 0.4 Mg Caps (Tamsulosin hcl) .... Take 1 tablet by mouth once a day 21)  Acetaminophen 325 Mg Tabs (Acetaminophen) .... As needed 22)  Lotrisone 1-0.05 % Crea (Clotrimazole-betamethasone) .... 2 x a day as needed 23)  Topicort 0.25 % Crea (Desoximetasone) .... As needed 24)  Cyanocobalamin 1000 Mcg/ml Soln (Cyanocobalamin)  .... Once monthly 25)  Ferrous Sulfate 325 (65 Fe) Mg Tabs (Ferrous sulfate) .Marland Kitchen.. 1 tab by mouth bid 26)  Robitussin Dm 100-10 Mg/43ml Syrp (Dextromethorphan-guaifenesin) .... As needed for cough 27)  Guaifenesin Dac 30-10-100 Mg/34ml Soln (Pseudoephedrine-codeine-gg) .... As needed 28)  Hydromet 5-1.5 Mg/71ml Syrp (Hydrocodone-homatropine) .... 1/2 to 1 tsp as needed 29)  Home Nebulizer  .... Use as directed 30)  Flutter Valve  .... Use two times a day  Patient Instructions: 1)  Make appt for lab visit in 1 mo (Ferritin, CBC, PT/INR, and HbA1c). 2)  Follow up in office with me in 25mo.   Medication Administration  Injection # 1:    Medication: Vit B12 1000 mcg    Diagnosis: PERNICIOUS ANEMIA (ICD-281.0)    Route: IM    Site: R deltoid    Exp Date: 02/2012    Lot #: [id number]    Mfr: [employer]    Comments: pt will return in one month for next injection    Patient tolerated injection without complications    Given by: Francee Piccolo CMA Duncan Dull) (July 20, 2010 9:58 AM)  Orders Added: 1)  Vit B12 1000 mcg [J3420] 2)  Admin of Therapeutic Inj  intramuscular or subcutaneous [96372] 3)  TLB-Microalbumin/Creat Ratio, Urine [82043-MALB] 4)  Est. Patient Level IV [30865]

## 2010-07-24 NOTE — Progress Notes (Signed)
Summary: rx refill Isosorbide  Phone Note Refill Request Call back at [phone] Message from:  Pharmacy on July 18, 2010 4:24 PM  Refills Requested: Medication #1:  ISOSORBIDE MONONITRATE CR 30 MG XR24H-TAB take one tablet by mouth once daily in back order. pharmacist wants to know if they can give the 60mg  and pt takes half a tablet.   Method Requested: Telephone to Pharmacy Initial call taken by: Roe Coombs,  July 18, 2010 4:26 PM  Follow-up for Phone Call        spoke with pharmacy and informed it be ok to split 60mg  in half. ok per Kennon Rounds. [place], [place]  July 18, 2010 4:52 PM

## 2010-07-26 ENCOUNTER — Telehealth: Payer: Self-pay | Admitting: Family Medicine

## 2010-07-26 ENCOUNTER — Encounter (INDEPENDENT_AMBULATORY_CARE_PROVIDER_SITE_OTHER): Payer: Medicare Other

## 2010-07-26 ENCOUNTER — Encounter: Payer: Self-pay | Admitting: Internal Medicine

## 2010-07-26 DIAGNOSIS — I428 Other cardiomyopathies: Secondary | ICD-10-CM

## 2010-07-29 LAB — BASIC METABOLIC PANEL
BUN: 25 mg/dL — ABNORMAL HIGH (ref 6–23)
BUN: 26 mg/dL — ABNORMAL HIGH (ref 6–23)
BUN: 26 mg/dL — ABNORMAL HIGH (ref 6–23)
BUN: 26 mg/dL — ABNORMAL HIGH (ref 6–23)
CO2: 27 mEq/L (ref 19–32)
CO2: 25 mEq/L (ref 19–32)
Calcium: 9.1 mg/dL (ref 8.4–10.5)
Calcium: 9.3 mg/dL (ref 8.4–10.5)
Calcium: 9.4 mg/dL (ref 8.4–10.5)
Calcium: 9.5 mg/dL (ref 8.4–10.5)
Calcium: 9.6 mg/dL (ref 8.4–10.5)
Calcium: 9.2 mg/dL (ref 8.4–10.5)
Chloride: 103 mEq/L (ref 96–112)
Creatinine, Ser: 1.34 mg/dL (ref 0.4–1.5)
Creatinine, Ser: 1.49 mg/dL (ref 0.4–1.5)
Creatinine, Ser: 1.52 mg/dL — ABNORMAL HIGH (ref 0.4–1.5)
GFR calc Af Amer: >60 mL/min (ref >60)
GFR calc Af Amer: 51 mL/min — ABNORMAL LOW (ref >60)
GFR calc Af Amer: 53 mL/min — ABNORMAL LOW (ref >60)
GFR calc Af Amer: 55 mL/min — ABNORMAL LOW (ref >60)
GFR calc Af Amer: >60 mL/min (ref >60)
GFR calc non Af Amer: 45 mL/min — ABNORMAL LOW (ref >60)
GFR calc non Af Amer: 51 mL/min — ABNORMAL LOW (ref >60)
GFR calc non Af Amer: 42 mL/min — ABNORMAL LOW (ref >60)
GFR calc non Af Amer: 42 mL/min — ABNORMAL LOW (ref >60)
GFR calc non Af Amer: 44 mL/min — ABNORMAL LOW (ref >60)
GFR calc non Af Amer: 45 mL/min — ABNORMAL LOW (ref >60)
GFR calc non Af Amer: 46 mL/min — ABNORMAL LOW (ref >60)
GFR calc non Af Amer: 53 mL/min — ABNORMAL LOW (ref >60)
GFR calc non Af Amer: 45 mL/min — ABNORMAL LOW (ref >60)
Glucose, Bld: 124 mg/dL — ABNORMAL HIGH (ref 70–99)
Glucose, Bld: 100 mg/dL — ABNORMAL HIGH (ref 70–99)
Glucose, Bld: 108 mg/dL — ABNORMAL HIGH (ref 70–99)
Glucose, Bld: 125 mg/dL — ABNORMAL HIGH (ref 70–99)
Potassium: 4.2 mEq/L (ref 3.5–5.1)
Potassium: 4.2 mEq/L (ref 3.5–5.1)
Potassium: 3.4 mEq/L — ABNORMAL LOW (ref 3.5–5.1)
Potassium: 3.9 mEq/L (ref 3.5–5.1)
Sodium: 141 mEq/L (ref 135–145)
Sodium: 138 mEq/L (ref 135–145)
Sodium: 138 mEq/L (ref 135–145)
Sodium: 140 mEq/L (ref 135–145)
Sodium: 141 mEq/L (ref 135–145)

## 2010-07-29 LAB — GLUCOSE, CAPILLARY
Glucose-Capillary: 131 mg/dL — ABNORMAL HIGH (ref 70–99)
Glucose-Capillary: 126 mg/dL — ABNORMAL HIGH (ref 70–99)
Glucose-Capillary: 106 mg/dL — ABNORMAL HIGH (ref 70–99)
Glucose-Capillary: 114 mg/dL — ABNORMAL HIGH (ref 70–99)
Glucose-Capillary: 122 mg/dL — ABNORMAL HIGH (ref 70–99)
Glucose-Capillary: 130 mg/dL — ABNORMAL HIGH (ref 70–99)
Glucose-Capillary: 140 mg/dL — ABNORMAL HIGH (ref 70–99)
Glucose-Capillary: 142 mg/dL — ABNORMAL HIGH (ref 70–99)
Glucose-Capillary: 142 mg/dL — ABNORMAL HIGH (ref 70–99)
Glucose-Capillary: 148 mg/dL — ABNORMAL HIGH (ref 70–99)
Glucose-Capillary: 149 mg/dL — ABNORMAL HIGH (ref 70–99)
Glucose-Capillary: 152 mg/dL — ABNORMAL HIGH (ref 70–99)
Glucose-Capillary: 159 mg/dL — ABNORMAL HIGH (ref 70–99)
Glucose-Capillary: 165 mg/dL — ABNORMAL HIGH (ref 70–99)
Glucose-Capillary: 178 mg/dL — ABNORMAL HIGH (ref 70–99)
Glucose-Capillary: 186 mg/dL — ABNORMAL HIGH (ref 70–99)
Glucose-Capillary: 215 mg/dL — ABNORMAL HIGH (ref 70–99)
Glucose-Capillary: 136 mg/dL — ABNORMAL HIGH (ref 70–99)
Glucose-Capillary: 167 mg/dL — ABNORMAL HIGH (ref 70–99)

## 2010-07-29 LAB — CBC
HCT: 35.5 % — ABNORMAL LOW (ref 39.0–52.0)
HCT: 37.1 % — ABNORMAL LOW (ref 39.0–52.0)
HCT: 38.3 % — ABNORMAL LOW (ref 39.0–52.0)
Hemoglobin: 11.9 g/dL — ABNORMAL LOW (ref 13.0–17.0)
Hemoglobin: 10.8 g/dL — ABNORMAL LOW (ref 13.0–17.0)
Hemoglobin: 11.8 g/dL — ABNORMAL LOW (ref 13.0–17.0)
Hemoglobin: 11.8 g/dL — ABNORMAL LOW (ref 13.0–17.0)
Hemoglobin: 11.9 g/dL — ABNORMAL LOW (ref 13.0–17.0)
Hemoglobin: 12 g/dL — ABNORMAL LOW (ref 13.0–17.0)
MCH: 27.6 pg (ref 26.0–34.0)
MCHC: 32 g/dL (ref 30.0–36.0)
MCHC: 32.1 g/dL (ref 30.0–36.0)
MCHC: 32.3 g/dL (ref 30.0–36.0)
MCHC: 32.5 g/dL (ref 30.0–36.0)
MCHC: 32.6 g/dL (ref 30.0–36.0)
MCHC: 32.6 g/dL (ref 30.0–36.0)
MCHC: 32.9 g/dL (ref 30.0–36.0)
MCV: 86.2 fL (ref 78.0–100.0)
MCV: 86.5 fL (ref 78.0–100.0)
Platelets: 145 10*3/uL — ABNORMAL LOW (ref 150–400)
Platelets: 151 10*3/uL (ref 150–400)
Platelets: 166 10*3/uL (ref 150–400)
Platelets: 123 10*3/uL — ABNORMAL LOW (ref 150–400)
RBC: 4.07 MIL/uL — ABNORMAL LOW (ref 4.22–5.81)
RBC: 3.91 MIL/uL — ABNORMAL LOW (ref 4.22–5.81)
RBC: 4.14 MIL/uL — ABNORMAL LOW (ref 4.22–5.81)
RBC: 4.27 MIL/uL (ref 4.22–5.81)
RBC: 4.31 MIL/uL (ref 4.22–5.81)
RDW: 14.2 % (ref 11.5–15.5)
RDW: 14.5 % (ref 11.5–15.5)
RDW: 14.5 % (ref 11.5–15.5)
RDW: 14.7 % (ref 11.5–15.5)
RDW: 14.7 % (ref 11.5–15.5)
WBC: 6.2 10*3/uL (ref 4.0–10.5)
WBC: 10 10*3/uL (ref 4.0–10.5)
WBC: 11.5 10*3/uL — ABNORMAL HIGH (ref 4.0–10.5)
WBC: 7.6 10*3/uL (ref 4.0–10.5)
WBC: 8 10*3/uL (ref 4.0–10.5)
WBC: 8.2 10*3/uL (ref 4.0–10.5)
WBC: 9.2 10*3/uL (ref 4.0–10.5)
WBC: 9.4 10*3/uL (ref 4.0–10.5)

## 2010-07-29 LAB — CARDIAC PANEL(CRET KIN+CKTOT+MB+TROPI)
CK, MB: 7.7 ng/mL (ref 0.3–4.0)
Relative Index: INVALID (ref 0.0–2.5)
Total CK: 37 U/L (ref 7–232)
Total CK: 129 U/L (ref 7–232)
Troponin I: 0.02 ng/mL (ref 0.00–0.06)
Troponin I: 2.19 ng/mL (ref 0.00–0.06)

## 2010-07-29 LAB — COMPREHENSIVE METABOLIC PANEL
ALT: 20 U/L (ref 0–53)
Alkaline Phosphatase: 47 U/L (ref 39–117)
BUN: 20 mg/dL (ref 6–23)
CO2: 29 mEq/L (ref 19–32)
Calcium: 9.5 mg/dL (ref 8.4–10.5)
Chloride: 104 mEq/L (ref 96–112)
Creatinine, Ser: 1.77 mg/dL — ABNORMAL HIGH (ref 0.4–1.5)
GFR calc non Af Amer: 48 mL/min — ABNORMAL LOW (ref >60)
GFR calc non Af Amer: 38 mL/min — ABNORMAL LOW (ref >60)
Glucose, Bld: 115 mg/dL — ABNORMAL HIGH (ref 70–99)
Glucose, Bld: 158 mg/dL — ABNORMAL HIGH (ref 70–99)
Potassium: 4.1 mEq/L (ref 3.5–5.1)
Total Bilirubin: 0.7 mg/dL (ref 0.3–1.2)
Total Bilirubin: 0.4 mg/dL (ref 0.3–1.2)
Total Protein: 6 g/dL (ref 6.0–8.3)

## 2010-07-29 LAB — URINALYSIS, ROUTINE W REFLEX MICROSCOPIC
Bilirubin Urine: NEGATIVE
Glucose, UA: NEGATIVE mg/dL
Glucose, UA: NEGATIVE mg/dL
Ketones, ur: NEGATIVE mg/dL
Leukocytes, UA: NEGATIVE
Specific Gravity, Urine: 1.016 (ref 1.005–1.030)
pH: 6.5 (ref 5.0–8.0)
pH: 6.5 (ref 5.0–8.0)

## 2010-07-29 LAB — PROTIME-INR
INR: 2.22 — ABNORMAL HIGH (ref 0.00–1.49)
INR: 1.12 (ref 0.00–1.49)
INR: 1.21 (ref 0.00–1.49)
INR: 1.22 (ref 0.00–1.49)
INR: 1.25 (ref 0.00–1.49)
INR: 1.46 (ref 0.00–1.49)
INR: 1.81 — ABNORMAL HIGH (ref 0.00–1.49)
INR: 1.89 — ABNORMAL HIGH (ref 0.00–1.49)
INR: 1.42 (ref 0.00–1.49)
Prothrombin Time: 23.6 seconds — ABNORMAL HIGH (ref 11.6–15.2)
Prothrombin Time: 24.4 seconds — ABNORMAL HIGH (ref 11.6–15.2)
Prothrombin Time: 14.3 seconds (ref 11.6–15.2)
Prothrombin Time: 15.2 seconds (ref 11.6–15.2)
Prothrombin Time: 15.3 seconds — ABNORMAL HIGH (ref 11.6–15.2)
Prothrombin Time: 17.6 seconds — ABNORMAL HIGH (ref 11.6–15.2)
Prothrombin Time: 20.8 seconds — ABNORMAL HIGH (ref 11.6–15.2)
Prothrombin Time: 21.5 seconds — ABNORMAL HIGH (ref 11.6–15.2)
Prothrombin Time: 23.1 seconds — ABNORMAL HIGH (ref 11.6–15.2)

## 2010-07-29 LAB — URINE CULTURE

## 2010-07-29 LAB — DIFFERENTIAL
Basophils Absolute: 0 10*3/uL (ref 0.0–0.1)
Basophils Absolute: 0 10*3/uL (ref 0.0–0.1)
Eosinophils Absolute: 0 10*3/uL (ref 0.0–0.7)
Eosinophils Relative: 0 % (ref 0–5)
Lymphocytes Relative: 11 % — ABNORMAL LOW (ref 12–46)
Lymphocytes Relative: 4 % — ABNORMAL LOW (ref 12–46)
Lymphs Abs: 0.5 10*3/uL — ABNORMAL LOW (ref 0.7–4.0)
Neutro Abs: 5 10*3/uL (ref 1.7–7.7)
Neutrophils Relative %: 81 % — ABNORMAL HIGH (ref 43–77)
Neutrophils Relative %: 94 % — ABNORMAL HIGH (ref 43–77)

## 2010-07-29 LAB — CK TOTAL AND CKMB (NOT AT ARMC)
CK, MB: 6.8 ng/mL (ref 0.3–4.0)
Relative Index: INVALID (ref 0.0–2.5)
Relative Index: 5.9 — ABNORMAL HIGH (ref 0.0–2.5)

## 2010-07-29 LAB — URINE MICROSCOPIC-ADD ON

## 2010-07-29 LAB — POCT CARDIAC MARKERS
CKMB, poc: 3.6 ng/mL (ref 1.0–8.0)
Troponin i, poc: 0.21 ng/mL — ABNORMAL HIGH (ref 0.00–0.09)
Troponin i, poc: <0.05 ng/mL (ref 0.00–0.09)

## 2010-07-29 LAB — TROPONIN I
Troponin I: 0.03 ng/mL (ref 0.00–0.06)
Troponin I: 0.69 ng/mL (ref 0.00–0.06)

## 2010-07-29 LAB — HEPARIN LEVEL (UNFRACTIONATED)
Heparin Unfractionated: 0.3 IU/mL (ref 0.30–0.70)
Heparin Unfractionated: 0.36 IU/mL (ref 0.30–0.70)
Heparin Unfractionated: 0.48 IU/mL (ref 0.30–0.70)
Heparin Unfractionated: 0.51 IU/mL (ref 0.30–0.70)
Heparin Unfractionated: 0.55 IU/mL (ref 0.30–0.70)
Heparin Unfractionated: 0.71 IU/mL — ABNORMAL HIGH (ref 0.30–0.70)

## 2010-07-29 LAB — LIPASE, BLOOD: Lipase: 24 U/L (ref 11–59)

## 2010-07-29 LAB — APTT: aPTT: 38 seconds — ABNORMAL HIGH (ref 24–37)

## 2010-07-31 NOTE — Procedures (Signed)
Summary: device check/sl  mca   Current Medications (verified): 1)  Budesonide 0.25 Mg/52ml Susp (Budesonide) .... One Vial Nebulized Two Times A Day 2)  Spiriva Handihaler 18 Mcg  Caps (Tiotropium Bromide Monohydrate) .... Use One Inhalation Daily. 3)  Xopenex Hfa 45 Mcg/act Aero (Levalbuterol Tartrate) .... Inhale 2 Puffs Four Times A Day As Needed 4)  Albuterol Sulfate (2.5 Mg/82ml) 0.083% Nebu (Albuterol Sulfate) .... One Vial Nebulized 3 To 4 Times Per Day As Needed 5)  Nasonex 50 Mcg/act  Susp (Mometasone Furoate) .... Two Puffs Each Nostril Daily 6)  Zyrtec Allergy 10 Mg Tabs (Cetirizine Hcl) .... Take 1 Tablet By Mouth Once A Day 7)  Adult Aspirin Ec Low Strength 81 Mg  Tbec (Aspirin) .... One Tablet Daily. 8)  Warfarin Sodium 5 Mg Tabs (Warfarin Sodium) .... Take As Directed Per Coumadin Clinic 9)  Isosorbide Mononitrate Cr 30 Mg Xr24h-Tab (Isosorbide Mononitrate) .... Take One Tablet By Mouth Once Daily 10)  Coreg 3.125 Mg Tabs (Carvedilol) .... 1/2 Two Times A Day 11)  Amiodarone Hcl 200 Mg Tabs (Amiodarone Hcl) .... Take 1 Tablet By Mouth Once A Day 12)  Benicar 20 Mg Tabs (Olmesartan Medoxomil) .... Take One-Half Tablet By Mouth Daily 13)  Lasix 40 Mg  Tabs (Furosemide) .... Take 1tablet By Mouth Two Times A Day 14)  Potassium Chloride Cr 10 Meq Cr-Caps (Potassium Chloride) .... Take One Tablet By Mouth Daily 15)  Nitro-Dur 0.4 Mg/hr  Pt24 (Nitroglycerin) .... Place Under Tongue As Needed For Chest Pain. 16)  Januvia 100 Mg  Tabs (Sitagliptin Phosphate) .... Take One Tablet Daily. 17)  Levothyroxine Sodium 125 Mcg Tabs (Levothyroxine Sodium) .... Alternate Every Other Day With Half Tablet 18)  Lithium Carbonate 300 Mg Cr-Tabs (Lithium Carbonate) .Marland Kitchen.. 1 Capsule Two Times A Day 19)  Finasteride 5 Mg Tabs (Finasteride) .Marland Kitchen.. 1 By Mouth Daily 20)  Flomax 0.4 Mg Caps (Tamsulosin Hcl) .... Take 1 Tablet By Mouth Once A Day 21)  Acetaminophen 325 Mg  Tabs (Acetaminophen) .... As  Needed 22)  Lotrisone 1-0.05 % Crea (Clotrimazole-Betamethasone) .... 2 X A Day As Needed 23)  Topicort 0.25 % Crea (Desoximetasone) .... As Needed 24)  Cyanocobalamin 1000 Mcg/ml Soln (Cyanocobalamin) .... Once Monthly 25)  Ferrous Sulfate 325 (65 Fe) Mg Tabs (Ferrous Sulfate) .Marland Kitchen.. 1 Tab By Mouth Bid 26)  Robitussin Dm 100-10 Mg/21ml Syrp (Dextromethorphan-Guaifenesin) .... As Needed For Cough 27)  Guaifenesin Dac 30-10-100 Mg/69ml Soln (Pseudoephedrine-Codeine-Gg) .... As Needed 28)  Hydromet 5-1.5 Mg/60ml Syrp (Hydrocodone-Homatropine) .... 1/2 To 1 Tsp As Needed 29)  Home Nebulizer .... Use As Directed 30)  Flutter Valve .... Use Two Times A Day  Allergies (verified): 1)  ! Morphine 2)  ! Lipitor 3)  ! * Ambien 4)  ! Prednisone   ICD Specifications Following MD:  Lewayne Bunting, MD     Referring MD:  [facility] ICD Vendor:  [employer]     ICD Model Number:  7232     ICD Serial Number:  [id number] ICD DOI:  08/28/2004     ICD Implanting MD:  Lewayne Bunting, MD  Lead 1:    Location: RV     DOI: 08/28/2004     Model #: 6295     Serial #: [id number]     Status: active Lead 2:    Location: RV     DOI: 01/17/2009     Model #: 1027     Serial #: [id number]     Status: active  Indications::  ICM   ICD Follow Up ICD Dependent:  No      Episodes Coumadin:  Yes  Brady Parameters Mode VVI     Lower Rate Limit:  55      Tachy Zones VF:  200     VT:  250 FVT VIA VF     VT1:  182     Tech Comments:  See [employer]

## 2010-07-31 NOTE — Miscellaneous (Signed)
  Clinical Lists Changes  Observations: Added new observation of PAST MED HX: CAD (ICD-414.00)- post coronary artery bypass graft surgery with prior percutaneos intervention of the saphenous vein graft with known saphenous vein graft disease (cath 10/2009). Descending thoracic/Proximal abdominal aortic aneurysm (largest at the level of the diaphragmatic hiatus, extending to the take-off of the celiac axis)      - 6.0 cm on u/s Nov 2011 Bilateral popliteal artery aneurysm CAD s/p multiple MI's Ischemic cardiomyopathy with EF 25% (echo 10/2009) Paroxysmal atrial fibrillation (tachy/brady syndrome: has pacemaker and defibrillator) Chronic renal insufficiency (baseline Cr 1.5) HTN Peripheral vascular disease Dyslipidemia Diabetes mellitus (urine microalbumin NEG 07/2010) GERD Hiatal hernia Diverticular disease BPH Prostate cancer (low grade, dx'd 07/2008--watchful waiting with [name]) COPD      - 05/09/08 PFT FEV1 1.84 (55%), FVC 4.42 (95%), TLC 6.46 (92%), DLCO 72%, no BD response Pulmonary nodule (left base): resolved on f/u CT 03/02/10 Allergic rhinitis Shingles OSA      - PSG 06/14/09 RDI 17, PLMI 96      - Intolerant of CPAP or BPAP (07/23/2010 8:30) Added new observation of PRIMARY MD: Michell Heinrich M.D. (07/23/2010 8:30)       Past History:  Past Medical History: CAD (ICD-414.00)- post coronary artery bypass graft surgery with prior percutaneos intervention of the saphenous vein graft with known saphenous vein graft disease (cath 10/2009). Descending thoracic/Proximal abdominal aortic aneurysm (largest at the level of the diaphragmatic hiatus, extending to the take-off of the celiac axis)      - 6.0 cm on u/s Nov 2011 Bilateral popliteal artery aneurysm CAD s/p multiple MI's Ischemic cardiomyopathy with EF 25% (echo 10/2009) Paroxysmal atrial fibrillation (tachy/brady syndrome: has pacemaker and defibrillator) Chronic renal insufficiency (baseline Cr  1.5) HTN Peripheral vascular disease Dyslipidemia Diabetes mellitus (urine microalbumin NEG 07/2010) GERD Hiatal hernia Diverticular disease BPH Prostate cancer (low grade, dx'd 07/2008--watchful waiting with [name]) COPD      - 05/09/08 PFT FEV1 1.84 (55%), FVC 4.42 (95%), TLC 6.46 (92%), DLCO 72%, no BD response Pulmonary nodule (left base): resolved on f/u CT 03/02/10 Allergic rhinitis Shingles OSA      - PSG 06/14/09 RDI 17, PLMI 96      - Intolerant of CPAP or BPAP

## 2010-07-31 NOTE — Progress Notes (Signed)
Summary: Medical records  Phone Note Other Incoming   Summary of Call: Please request records from Dr. Jerelene Redden office [facility] endocrinology and diabetes). Initial call taken by: Michell Heinrich M.D.,  July 20, 2010 10:47 AM  Follow-up for Phone Call        They have the request at Dr Talbert Forest office, they will be sending it in a couple of weeks, they are backlogged Diane Tomerlin  July 20, 2010 1:40 PM Follow-up by: Georga Bora,  July 23, 2010 11:29 AM

## 2010-07-31 NOTE — Progress Notes (Signed)
Summary: ROI FAXED  Phone Note Other Incoming   Summary of Call: Please request records from Dr. Elmer Picker at [facility] in [place].  Thx Initial call taken by: Michell Heinrich M.D.,  July 20, 2010 2:34 PM  Follow-up for Phone Call        ROI faxed to Dr. Elmer Picker 3.12.2012. Follow-up by: Georga Bora,  July 23, 2010 3:58 PM

## 2010-07-31 NOTE — Progress Notes (Signed)
Summary: Januvia & Lasix refill  Phone Note Refill Request Message from:  Fax from Pharmacy on July 26, 2010 10:39 AM  Refills Requested: Medication #1:  JANUVIA 100 MG  TABS Take one tablet daily.   Dosage confirmed as above?Dosage Confirmed   Brand Name Necessary? No   Supply Requested: 1 month   Last Refilled: 06/20/2010  Medication #2:  LASIX 40 MG  TABS Take 1tablet by mouth two times a day   Dosage confirmed as above?Dosage Confirmed   Brand Name Necessary? No   Supply Requested: 1 month   Last Refilled: 06/14/2010  Method Requested: Electronic Next Appointment Scheduled: 07/26/10 Initial call taken by: Lannette Donath,  July 26, 2010 10:40 AM  Follow-up for Phone Call        Pt due for follow up in 3 months.  Refill sent through that time. Follow-up by: Francee Piccolo CMA Duncan Dull),  July 26, 2010 4:05 PM    Prescriptions: JANUVIA 100 MG  TABS (SITAGLIPTIN PHOSPHATE) Take one tablet daily.  #30 x 3   Entered by:   Francee Piccolo CMA (AAMA)   Authorized by:   Michell Heinrich M.D.   Signed by:   Francee Piccolo CMA (AAMA) on 07/26/2010   Method used:   Electronically to        [facility]  [address] [phone] ([facility]       [address]       [place], [place]  [address]       Ph: [phone] or [phone]       Fax: [phone]   RxID:   [id number] LASIX 40 MG  TABS (FUROSEMIDE) Take 1tablet by mouth two times a day  #60 x 3   Entered by:   Francee Piccolo CMA (AAMA)   Authorized by:   Michell Heinrich M.D.   Signed by:   Francee Piccolo CMA (AAMA) on 07/26/2010   Method used:   Electronically to        [facility]  [address] [phone] (retail)       [address]       [place], [place]  [address]       Ph: [phone] or [phone]       Fax: [phone]   RxID:   [id number]

## 2010-08-09 NOTE — Assessment & Plan Note (Signed)
Summary: 2 month follow up   Visit Type:  follow-up 2 months Referring Provider:  [name], [name], Dr. Arbie Cookey Primary Provider:  Elizebeth Brooking McGowen M.D.  CC:  sob and pt has no other complaints today.  History of Present Illness: [name] is in for followup.  He has been holding his own, although continues to have some shortenss of breath.   Just dcd from hospital after admission.  Noted to be pacing alot, so beta blockers were decreased by Dr. [name], with some adjustment of backup pacing rate.   February 9 dc diagnosis:  DISCHARGE DIAGNOSIS:  Paroxysmal atrial fibrillation.      SECONDARY DIAGNOSES:   1. Coronary artery disease status post prior coronary artery bypass       grafting with last catheterization in June 2011 showing 2/4 patent       grafts.   2. Ischemic cardiomyopathy/chronic systolic congestive heart failure       with an ejection fraction of 20% to 25% by echo in June 2011.   3. History of atrial flutter status post radiofrequency catheter  ablation.   4. Chronic Coumadin anticoagulation.   5. Peripheral arterial disease.   6. Status post infrarenal abdominal aortic aneurysm repair.   7. Thoracic aortic aneurysm followed by thoracic surgery.   8. Status post lower extremity bypass grafting.   9. Hypertension.   10.Hyperlipidemia.   11.Diabetes mellitus.   12.Chronic obstructive pulmonary disease.   13.Remote tobacco abuse.   14.Bipolar disorder.   15.History of shingles.   16.Stage III chronic kidney disease.   17.History of prostate cancer.   18.Status post partial colectomy.   19.History of diverticulitis.   Also has been followed  by Dr.  Arbie Cookey who has been reluctant to committ him to anything dramatic because of this risks despite thoracoabdominal anuerysm.      Problems Prior to Update: 1)  Gerd  (ICD-530.81) 2)  Diverticulosis, Colon  (ICD-562.10) 3)  Prostate Cancer  (ICD-185) 4)  Iron Deficiency  (ICD-280.9) 5)  Unspecified  Anemia  (ICD-285.9) 6)  Onychomycosis  (ICD-110.1) 7)  Pernicious Anemia  (ICD-281.0) 8)  Hypothyroidism  (ICD-244.9) 9)  Renal Insufficiency  (ICD-588.9) 10)  Aneurysm, Thoracic Aortic  (ICD-441.2) 11)  Cad, Artery Bypass Graft  (ICD-414.04) 12)  Hoarseness  (ICD-784.42) 13)  Abdominal Aortic Aneurysm  (ICD-441.4) 14)  Atrial Fibrillation  (ICD-427.31) 15)  Peripheral Vascular Disease  (ICD-443.9) 16)  Implantation of Defibrillator, Hx of  (ICD-V45.02) 17)  Ischemic Cardiomyopathy  (ICD-414.8) 18)  Diabetes Mellitus, Type II  (ICD-250.00) 19)  Left Ventricular Failure  (ICD-428.1) 20)  Hyperlipidemia  (ICD-272.4) 21)  Hx of Myocardial Infarction  (ICD-410.90) 22)  Hypertension  (ICD-401.9) 23)  COPD  (ICD-496) 24)  Allergic Rhinitis  (ICD-477.9)  Current Medications (verified): 1)  Budesonide 0.25 Mg/21ml Susp (Budesonide) .... One Vial Nebulized Two Times A Day 2)  Spiriva Handihaler 18 Mcg  Caps (Tiotropium Bromide Monohydrate) .... Use One Inhalation Daily. 3)  Xopenex Hfa 45 Mcg/act Aero (Levalbuterol Tartrate) .... Inhale 2 Puffs Four Times A Day As Needed 4)  Albuterol Sulfate (2.5 Mg/30ml) 0.083% Nebu (Albuterol Sulfate) .... One Vial Nebulized 3 To 4 Times Per Day As Needed 5)  Nasonex 50 Mcg/act  Susp (Mometasone Furoate) .... Two Puffs Each Nostril Daily 6)  Zyrtec Allergy 10 Mg Tabs (Cetirizine Hcl) .... Take 1 Tablet By Mouth Once A Day 7)  Adult Aspirin Ec Low Strength 81 Mg  Tbec (Aspirin) .... One Tablet Daily.  8)  Warfarin Sodium 5 Mg Tabs (Warfarin Sodium) .... Take As Directed Per Coumadin Clinic 9)  Isosorbide Mononitrate Cr 30 Mg Xr24h-Tab (Isosorbide Mononitrate) .... Take One Tablet By Mouth Once Daily 10)  Coreg 3.125 Mg Tabs (Carvedilol) .... 1/2 Two Times A Day 11)  Amiodarone Hcl 200 Mg Tabs (Amiodarone Hcl) .... Take 1 Tablet By Mouth Once A Day 12)  Benicar 20 Mg Tabs (Olmesartan Medoxomil) .... Take One-Half Tablet By Mouth Daily 13)  Lasix 40 Mg  Tabs  (Furosemide) .... Take 1tablet By Mouth Two Times A Day 14)  Potassium Chloride Cr 10 Meq Cr-Caps (Potassium Chloride) .... Take One Tablet By Mouth Daily 15)  Nitro-Dur 0.4 Mg/hr  Pt24 (Nitroglycerin) .... Place Under Tongue As Needed For Chest Pain. 16)  Januvia 100 Mg  Tabs (Sitagliptin Phosphate) .... Take One Tablet Daily. 17)  Levothyroxine Sodium 125 Mcg Tabs (Levothyroxine Sodium) .... Once Daily 18)  Lithium Carbonate 300 Mg  Caps (Lithium Carbonate) .... Take One Capsule Two Times A Day 19)  Finasteride 5 Mg Tabs (Finasteride) .Marland Kitchen.. 1 By Mouth Daily 20)  Flomax 0.4 Mg Caps (Tamsulosin Hcl) .... Take 1 Tablet By Mouth Once A Day 21)  Acetaminophen 325 Mg  Tabs (Acetaminophen) .... As Needed 22)  Lotrisone 1-0.05 % Crea (Clotrimazole-Betamethasone) .... 2 X A Day As Needed 23)  Topicort 0.25 % Crea (Desoximetasone) .... As Needed 24)  Cyanocobalamin 1000 Mcg/ml Soln (Cyanocobalamin) .... Once Monthly 25)  Ferrous Sulfate 325 (65 Fe) Mg Tabs (Ferrous Sulfate) .Marland Kitchen.. 1 Tab By Mouth Bid 26)  Robitussin Dm 100-10 Mg/9ml Syrp (Dextromethorphan-Guaifenesin) .... As Needed For Cough 27)  Guaifenesin Dac 30-10-100 Mg/61ml Soln (Pseudoephedrine-Codeine-Gg) .... As Needed 28)  Hydromet 5-1.5 Mg/14ml Syrp (Hydrocodone-Homatropine) .... 1/2 To 1 Tsp As Needed 29)  Home Nebulizer .... Use As Directed 30)  Flutter Valve .... Use Two Times A Day  Allergies (verified): 1)  ! Morphine 2)  ! Lipitor 3)  ! * Ambien 4)  ! Prednisone  Vital Signs:  Patient profile:   75 year old male Height:      70.5 inches Weight:      166.50 pounds BMI:     23.64 Pulse rate:   57 / minute Resp:     18 per minute BP sitting:   90 / 62  (left arm) Cuff size:   regular  Vitals Entered By: Celestia Khat, CMA (July 09, 2010 3:38 PM)  Physical Exam  General:  Well developed, well nourished, in no acute distress. Head:  normocephalic and atraumatic Eyes:  PERRLA/EOM intact; conjunctiva and lids  normal. Lungs:  Markedly prolonged expiration with decrease breath sounnds. Heart:  PMI laterally displaced. Normal S1.  S2 widely split.  Abdomen:  Bowel sounds positive; abdomen soft and non-tender without masses, organomegaly, or hernias noted. No hepatosplenomegaly. Extremities:  No clubbing or cyanosis.  No definite edema. Neurologic:  Alert and oriented x 3.   EKG  Procedure date:  07/09/2010  Findings:      NSr.  Anterior infarct, old.  Left axis deviation.  T inversion in V5, V6, cannot exclude ischemia.    CXR  Procedure date:  04/11/2010  Findings:      CHEST - 2 VIEW  Comparison: CT chest 03/01/2010 and chest radiograph 11/09/2009  Findings: Trachea is midline.  Heart size normal.  Thoracic aorta is calcified, and contains outpouchings along the descending portion.  Pacemaker and AICD lead tips project over the right ventricle.  There is flattening  of the hemidiaphragms with enlargement of the retrosternal clear space.  Mild bibasilar linear scarring.  IMPRESSION:  1.  COPD with bibasilar linear scarring.  No acute findings. 2.  There is aneurysmal dilatation of the descending thoracic aorta.   Cardiac Cath  Procedure date:  11/07/2009  Findings:       CONCLUSIONS:   1. Coronary artery disease status post coronary bypass graft surgery       in 1996.   2. Severe native vessel disease with 40% narrowing in the proximal       left anterior descending, 70% narrowing in the proximal circumflex       artery with multiple 90% stenoses in the second marginal branch,       total occlusion of the first marginal branch, and 95% stenosis in       the posterolateral branch, and total occlusion of the right       coronary artery.   3. Occluded vein graft to the circumflex artery which is new since the       last study, occlusion of the vein graft to the right coronary which       is old, patent vein graft to diagonal branch of the left anterior        descending with 40% distal stenosis, and a patent left internal       mammary artery graft to the left anterior descending.   4. Severe left ventricular function by noninvasive studies.      RECOMMENDATIONS:  Since the last study, the patient has occluded vein   graft to the circumflex artery which probably represents the culprit   lesion.  This does not appear to be favorable for percutaneous   intervention.  We will plan continued medical therapy.    ICD Specifications Following MD:  Lewayne Bunting, MD     Referring MD:  [facility] ICD Vendor:  [employer]     ICD Model Number:  7232     ICD Serial Number:  [id number] ICD DOI:  08/28/2004     ICD Implanting MD:  Lewayne Bunting, MD  Lead 1:    Location: [facility]     DOI: 08/28/2004     Model #: 6949     Serial #: [id number]     Status: active Lead 2:    Location: [facility]     DOI: 01/17/2009     Model #: 6440     Serial #: [id number]     Status: active  Indications::  ICM   ICD Follow Up ICD Dependent:  No      Episodes Coumadin:  Yes  Brady Parameters Mode VVI     Lower Rate Limit:  55      Tachy Zones VF:  200     VT:  250 FVT VIA VF     VT1:  182     Impression & Recommendations:  Problem # 1:  CAD, ARTERY BYPASS GRAFT (ICD-414.04) See cath report from July.  Occluded SVG, and reduced LV.  ICD in place.  Hemodynamically stable, but limited by combo of COPE and LV dysfunction.  Recent admission with alterations made in pacing parameters to allow more intrinsic AV conduction and native beats.   Was pacing alot, nearly 50% of the time.  Current ECG, and patient, seem better.  His updated medication list for this problem includes:    Adult Aspirin Ec Low Strength 81 Mg Tbec (Aspirin) ..... One tablet daily.    Warfarin Sodium  5 Mg Tabs (Warfarin sodium) .Marland Kitchen... Take as directed per coumadin clinic    Isosorbide Mononitrate Cr 30 Mg Xr24h-tab (Isosorbide mononitrate) .Marland Kitchen... Take one tablet by mouth once daily    Coreg 3.125 Mg Tabs (Carvedilol) .Marland Kitchen...  1/2 two times a day    Nitro-dur 0.4 Mg/hr Pt24 (Nitroglycerin) .Marland Kitchen... Place under tongue as needed for chest pain.  Orders: EKG w/ Interpretation (93000) TLB-BMP (Basic Metabolic Panel-BMET) (80048-METABOL) TLB-CBC Platelet - w/Differential (85025-CBCD)  Problem # 2:  IRON DEFICIENCY (ICD-280.9) Seeing [name] tomorrow.  Problem # 3:  ABDOMINAL AORTIC ANEURYSM (ICD-441.4) Continues to follow with Dr. Arbie Cookey  Problem # 4:  IMPLANTATION OF DEFIBRILLATOR, HX OF (ICD-V45.02) See adjustments made during hospitalization.  Problem # 5:  ATRIAL FIBRILLATION (ICD-427.31) Despite pulm disease, EP continues him on Amio as he has atrial fib and is symptomatic.  His updated medication list for this problem includes:    Adult Aspirin Ec Low Strength 81 Mg Tbec (Aspirin) ..... One tablet daily.    Warfarin Sodium 5 Mg Tabs (Warfarin sodium) .Marland Kitchen... Take as directed per coumadin clinic    Coreg 3.125 Mg Tabs (Carvedilol) .Marland Kitchen... 1/2 two times a day    Amiodarone Hcl 200 Mg Tabs (Amiodarone hcl) .Marland Kitchen... Take 1 tablet by mouth once a day  Orders: EKG w/ Interpretation (93000) TLB-BMP (Basic Metabolic Panel-BMET) (80048-METABOL) TLB-CBC Platelet - w/Differential (85025-CBCD)  Patient Instructions: 1)  Your physician recommends that you schedule a follow-up appointment in: 6 WEEKS 2)  Your physician recommends that you have lab work today: BMP, CBC 3)  Your physician recommends that you continue on your current medications as directed. Please refer to the Current Medication list given to you today.

## 2010-08-13 ENCOUNTER — Other Ambulatory Visit: Payer: Self-pay | Admitting: *Deleted

## 2010-08-13 LAB — DIFFERENTIAL
Basophils Absolute: 0 10*3/uL (ref 0.0–0.1)
Basophils Relative: 1 % (ref 0–1)
Eosinophils Absolute: 0.2 10*3/uL (ref 0.0–0.7)
Lymphocytes Relative: 14 % (ref 12–46)
Lymphocytes Relative: 9 % — ABNORMAL LOW (ref 12–46)
Lymphs Abs: 0.7 10*3/uL (ref 0.7–4.0)
Lymphs Abs: 0.8 10*3/uL (ref 0.7–4.0)
Monocytes Absolute: 0.4 10*3/uL (ref 0.1–1.0)
Monocytes Relative: 5 % (ref 3–12)
Monocytes Relative: 7 % (ref 3–12)
Neutro Abs: 4.5 10*3/uL (ref 1.7–7.7)
Neutro Abs: 6.3 10*3/uL (ref 1.7–7.7)

## 2010-08-13 LAB — URINE MICROSCOPIC-ADD ON

## 2010-08-13 LAB — APTT
aPTT: 37 seconds (ref 24–37)
aPTT: 38 seconds — ABNORMAL HIGH (ref 24–37)

## 2010-08-13 LAB — CARDIAC PANEL(CRET KIN+CKTOT+MB+TROPI)
CK, MB: 1.8 ng/mL (ref 0.3–4.0)
Total CK: 59 U/L (ref 7–232)
Troponin I: 0.06 ng/mL (ref 0.00–0.06)

## 2010-08-13 LAB — PROTIME-INR
INR: 1.73 — ABNORMAL HIGH (ref 0.00–1.49)
INR: 1.73 — ABNORMAL HIGH (ref 0.00–1.49)

## 2010-08-13 LAB — BASIC METABOLIC PANEL
BUN: 24 mg/dL — ABNORMAL HIGH (ref 6–23)
CO2: 24 mEq/L (ref 19–32)
CO2: 27 mEq/L (ref 19–32)
Calcium: 9 mg/dL (ref 8.4–10.5)
Creatinine, Ser: 1.5 mg/dL (ref 0.4–1.5)
GFR calc Af Amer: 56 mL/min — ABNORMAL LOW (ref >60)
Glucose, Bld: 104 mg/dL — ABNORMAL HIGH (ref 70–99)
Glucose, Bld: 119 mg/dL — ABNORMAL HIGH (ref 70–99)
Potassium: 4.2 mEq/L (ref 3.5–5.1)
Sodium: 141 mEq/L (ref 135–145)

## 2010-08-13 LAB — TSH: TSH: 9.05 u[IU]/mL — ABNORMAL HIGH (ref 0.350–4.500)

## 2010-08-13 LAB — CBC
Hemoglobin: 11 g/dL — ABNORMAL LOW (ref 13.0–17.0)
MCHC: 33.6 g/dL (ref 30.0–36.0)
Platelets: 191 10*3/uL (ref 150–400)
RBC: 3.69 MIL/uL — ABNORMAL LOW (ref 4.22–5.81)
RDW: 15 % (ref 11.5–15.5)
WBC: 5.9 10*3/uL (ref 4.0–10.5)

## 2010-08-13 LAB — CK TOTAL AND CKMB (NOT AT ARMC)
Relative Index: INVALID (ref 0.0–2.5)
Relative Index: INVALID (ref 0.0–2.5)
Relative Index: INVALID (ref 0.0–2.5)
Total CK: 59 U/L (ref 7–232)

## 2010-08-13 LAB — URINALYSIS, ROUTINE W REFLEX MICROSCOPIC
Nitrite: NEGATIVE
Specific Gravity, Urine: 1.017 (ref 1.005–1.030)
Urobilinogen, UA: 0.2 mg/dL (ref 0.0–1.0)

## 2010-08-13 LAB — GLUCOSE, CAPILLARY: Glucose-Capillary: 91 mg/dL (ref 70–99)

## 2010-08-13 LAB — MAGNESIUM: Magnesium: 2.5 mg/dL (ref 1.5–2.5)

## 2010-08-13 LAB — TROPONIN I: Troponin I: 0.07 ng/mL — ABNORMAL HIGH (ref 0.00–0.06)

## 2010-08-13 MED ORDER — LEVALBUTEROL TARTRATE 45 MCG/ACT IN AERO: 1.0000 | INHALATION_SPRAY | Freq: Four times a day (QID) | RESPIRATORY_TRACT | Status: DC | PRN

## 2010-08-14 LAB — BASIC METABOLIC PANEL
BUN: 16 mg/dL (ref 6–23)
BUN: 16 mg/dL (ref 6–23)
BUN: 18 mg/dL (ref 6–23)
BUN: 22 mg/dL (ref 6–23)
CO2: 27 mEq/L (ref 19–32)
CO2: 27 mEq/L (ref 19–32)
CO2: 27 mEq/L (ref 19–32)
CO2: 27 mEq/L (ref 19–32)
CO2: 28 mEq/L (ref 19–32)
CO2: 29 mEq/L (ref 19–32)
Calcium: 10 mg/dL (ref 8.4–10.5)
Calcium: 9.7 mg/dL (ref 8.4–10.5)
Chloride: 104 mEq/L (ref 96–112)
Chloride: 105 mEq/L (ref 96–112)
Chloride: 106 mEq/L (ref 96–112)
Chloride: 107 mEq/L (ref 96–112)
Chloride: 107 mEq/L (ref 96–112)
Chloride: 109 mEq/L (ref 96–112)
Creatinine, Ser: 1.17 mg/dL (ref 0.4–1.5)
Creatinine, Ser: 1.38 mg/dL (ref 0.4–1.5)
Creatinine, Ser: 1.41 mg/dL (ref 0.4–1.5)
Creatinine, Ser: 1.42 mg/dL (ref 0.4–1.5)
Creatinine, Ser: 1.57 mg/dL — ABNORMAL HIGH (ref 0.4–1.5)
GFR calc Af Amer: 53 mL/min — ABNORMAL LOW (ref >60)
GFR calc Af Amer: 59 mL/min — ABNORMAL LOW (ref >60)
GFR calc Af Amer: 60 mL/min — ABNORMAL LOW (ref >60)
GFR calc Af Amer: >60 mL/min (ref >60)
GFR calc Af Amer: >60 mL/min (ref >60)
GFR calc Af Amer: >60 mL/min (ref >60)
GFR calc Af Amer: >60 mL/min (ref >60)
GFR calc non Af Amer: 44 mL/min — ABNORMAL LOW (ref >60)
Glucose, Bld: 125 mg/dL — ABNORMAL HIGH (ref 70–99)
Glucose, Bld: 126 mg/dL — ABNORMAL HIGH (ref 70–99)
Glucose, Bld: 132 mg/dL — ABNORMAL HIGH (ref 70–99)
Potassium: 3.2 mEq/L — ABNORMAL LOW (ref 3.5–5.1)
Potassium: 3.9 mEq/L (ref 3.5–5.1)
Potassium: 4 mEq/L (ref 3.5–5.1)
Potassium: 4.2 mEq/L (ref 3.5–5.1)
Potassium: 4.7 mEq/L (ref 3.5–5.1)
Sodium: 137 mEq/L (ref 135–145)
Sodium: 137 mEq/L (ref 135–145)
Sodium: 140 mEq/L (ref 135–145)
Sodium: 141 mEq/L (ref 135–145)

## 2010-08-14 LAB — CBC
HCT: 32.3 % — ABNORMAL LOW (ref 39.0–52.0)
HCT: 34.3 % — ABNORMAL LOW (ref 39.0–52.0)
Hemoglobin: 10.7 g/dL — ABNORMAL LOW (ref 13.0–17.0)
Hemoglobin: 11.2 g/dL — ABNORMAL LOW (ref 13.0–17.0)
MCHC: 32.8 g/dL (ref 30.0–36.0)
MCHC: 32.9 g/dL (ref 30.0–36.0)
MCV: 89.9 fL (ref 78.0–100.0)
MCV: 89.9 fL (ref 78.0–100.0)
Platelets: 109 10*3/uL — ABNORMAL LOW (ref 150–400)
Platelets: 127 10*3/uL — ABNORMAL LOW (ref 150–400)
RBC: 3.59 MIL/uL — ABNORMAL LOW (ref 4.22–5.81)
RBC: 3.81 MIL/uL — ABNORMAL LOW (ref 4.22–5.81)
RBC: 4 MIL/uL — ABNORMAL LOW (ref 4.22–5.81)
RBC: 4.07 MIL/uL — ABNORMAL LOW (ref 4.22–5.81)
RDW: 15.6 % — ABNORMAL HIGH (ref 11.5–15.5)
WBC: 5.4 10*3/uL (ref 4.0–10.5)
WBC: 6.3 10*3/uL (ref 4.0–10.5)

## 2010-08-14 LAB — PROTIME-INR
INR: 1.03 (ref 0.00–1.49)
INR: 1.07 (ref 0.00–1.49)
INR: 1.11 (ref 0.00–1.49)
Prothrombin Time: 13.4 seconds (ref 11.6–15.2)
Prothrombin Time: 13.8 s (ref 11.6–15.2)
Prothrombin Time: 14.2 seconds (ref 11.6–15.2)

## 2010-08-14 LAB — GLUCOSE, CAPILLARY
Glucose-Capillary: 113 mg/dL — ABNORMAL HIGH (ref 70–99)
Glucose-Capillary: 123 mg/dL — ABNORMAL HIGH (ref 70–99)
Glucose-Capillary: 124 mg/dL — ABNORMAL HIGH (ref 70–99)
Glucose-Capillary: 125 mg/dL — ABNORMAL HIGH (ref 70–99)
Glucose-Capillary: 126 mg/dL — ABNORMAL HIGH (ref 70–99)
Glucose-Capillary: 128 mg/dL — ABNORMAL HIGH (ref 70–99)
Glucose-Capillary: 128 mg/dL — ABNORMAL HIGH (ref 70–99)
Glucose-Capillary: 136 mg/dL — ABNORMAL HIGH (ref 70–99)
Glucose-Capillary: 142 mg/dL — ABNORMAL HIGH (ref 70–99)
Glucose-Capillary: 162 mg/dL — ABNORMAL HIGH (ref 70–99)
Glucose-Capillary: 88 mg/dL (ref 70–99)

## 2010-08-14 LAB — HEMOCCULT GUIAC POC 1CARD (OFFICE): Fecal Occult Bld: POSITIVE

## 2010-08-14 LAB — HEPARIN LEVEL (UNFRACTIONATED)
Heparin Unfractionated: 0.45 IU/mL (ref 0.30–0.70)
Heparin Unfractionated: 0.54 IU/mL (ref 0.30–0.70)

## 2010-08-15 LAB — DIFFERENTIAL
Basophils Absolute: 0 10*3/uL (ref 0.0–0.1)
Basophils Relative: 0 % (ref 0–1)
Eosinophils Absolute: 0.1 10*3/uL (ref 0.0–0.7)
Eosinophils Relative: 1 % (ref 0–5)
Lymphs Abs: 0.6 10*3/uL — ABNORMAL LOW (ref 0.7–4.0)
Neutrophils Relative %: 85 % — ABNORMAL HIGH (ref 43–77)

## 2010-08-15 LAB — GLUCOSE, CAPILLARY
Glucose-Capillary: 110 mg/dL — ABNORMAL HIGH (ref 70–99)
Glucose-Capillary: 137 mg/dL — ABNORMAL HIGH (ref 70–99)

## 2010-08-15 LAB — COMPREHENSIVE METABOLIC PANEL
ALT: 13 U/L (ref 0–53)
ALT: 15 U/L (ref 0–53)
AST: 17 U/L (ref 0–37)
AST: 33 U/L (ref 0–37)
Alkaline Phosphatase: 46 U/L (ref 39–117)
CO2: 27 mEq/L (ref 19–32)
CO2: 29 mEq/L (ref 19–32)
Calcium: 9.1 mg/dL (ref 8.4–10.5)
Chloride: 106 mEq/L (ref 96–112)
Chloride: 109 mEq/L (ref 96–112)
GFR calc Af Amer: >60 mL/min (ref >60)
GFR calc Af Amer: >60 mL/min (ref >60)
GFR calc non Af Amer: 56 mL/min — ABNORMAL LOW (ref >60)
GFR calc non Af Amer: 57 mL/min — ABNORMAL LOW (ref >60)
Glucose, Bld: 108 mg/dL — ABNORMAL HIGH (ref 70–99)
Glucose, Bld: 116 mg/dL — ABNORMAL HIGH (ref 70–99)
Sodium: 139 mEq/L (ref 135–145)
Sodium: 143 mEq/L (ref 135–145)
Total Bilirubin: 0.5 mg/dL (ref 0.3–1.2)
Total Bilirubin: 0.8 mg/dL (ref 0.3–1.2)

## 2010-08-15 LAB — CARDIAC PANEL(CRET KIN+CKTOT+MB+TROPI)
CK, MB: 8.1 ng/mL — ABNORMAL HIGH (ref 0.3–4.0)
Relative Index: 9.2 — ABNORMAL HIGH (ref 0.0–2.5)
Total CK: 149 U/L (ref 7–232)
Total CK: 314 U/L — ABNORMAL HIGH (ref 7–232)
Troponin I: 8.79 ng/mL (ref 0.00–0.06)
Troponin I: 9.14 ng/mL (ref 0.00–0.06)

## 2010-08-15 LAB — BASIC METABOLIC PANEL
BUN: 14 mg/dL (ref 6–23)
BUN: 16 mg/dL (ref 6–23)
Calcium: 9 mg/dL (ref 8.4–10.5)
Chloride: 108 mEq/L (ref 96–112)
Chloride: 109 mEq/L (ref 96–112)
Creatinine, Ser: 1.03 mg/dL (ref 0.4–1.5)
Creatinine, Ser: 1.1 mg/dL (ref 0.4–1.5)
GFR calc Af Amer: >60 mL/min (ref >60)
GFR calc Af Amer: >60 mL/min (ref >60)
GFR calc non Af Amer: >60 mL/min (ref >60)
GFR calc non Af Amer: >60 mL/min (ref >60)
Potassium: 3.5 mEq/L (ref 3.5–5.1)

## 2010-08-15 LAB — HEPARIN INDUCED THROMBOCYTOPENIA PNL
Heparin Induced Plt Ab: NEGATIVE
Patient O.D.: 0.217
Serotonin Release: 5 % release (ref <20)

## 2010-08-15 LAB — CBC
HCT: 33.5 % — ABNORMAL LOW (ref 39.0–52.0)
HCT: 35.1 % — ABNORMAL LOW (ref 39.0–52.0)
Hemoglobin: 10.4 g/dL — ABNORMAL LOW (ref 13.0–17.0)
MCHC: 32.9 g/dL (ref 30.0–36.0)
MCHC: 33.5 g/dL (ref 30.0–36.0)
MCV: 89.1 fL (ref 78.0–100.0)
MCV: 89.4 fL (ref 78.0–100.0)
Platelets: 116 10*3/uL — ABNORMAL LOW (ref 150–400)
Platelets: 117 10*3/uL — ABNORMAL LOW (ref 150–400)
RBC: 3.5 MIL/uL — ABNORMAL LOW (ref 4.22–5.81)
RBC: 3.94 MIL/uL — ABNORMAL LOW (ref 4.22–5.81)
RDW: 15.1 % (ref 11.5–15.5)
WBC: 5.8 10*3/uL (ref 4.0–10.5)
WBC: 8.2 10*3/uL (ref 4.0–10.5)

## 2010-08-15 LAB — LIPASE, BLOOD: Lipase: 23 U/L (ref 11–59)

## 2010-08-15 LAB — CK TOTAL AND CKMB (NOT AT ARMC): Total CK: 100 U/L (ref 7–232)

## 2010-08-15 LAB — PROTIME-INR
INR: 1.45 (ref 0.00–1.49)
Prothrombin Time: 17.5 seconds — ABNORMAL HIGH (ref 11.6–15.2)
Prothrombin Time: 17.6 seconds — ABNORMAL HIGH (ref 11.6–15.2)

## 2010-08-15 LAB — LITHIUM LEVEL: Lithium Lvl: 0.71 mEq/L — ABNORMAL LOW (ref 0.80–1.40)

## 2010-08-15 LAB — POCT CARDIAC MARKERS
CKMB, poc: 2.5 ng/mL (ref 1.0–8.0)
Myoglobin, poc: 160 ng/mL (ref 12–200)

## 2010-08-15 LAB — HEPARIN LEVEL (UNFRACTIONATED): Heparin Unfractionated: 0.62 IU/mL (ref 0.30–0.70)

## 2010-08-15 LAB — LIPID PANEL
Cholesterol: 185 mg/dL (ref 0–200)
HDL: 45 mg/dL (ref >39)
Total CHOL/HDL Ratio: 4.1 RATIO
VLDL: 17 mg/dL (ref 0–40)

## 2010-08-15 LAB — HEMOGLOBIN A1C: Hgb A1c MFr Bld: 6.5 % — ABNORMAL HIGH (ref 4.6–6.1)

## 2010-08-16 ENCOUNTER — Encounter: Payer: Medicare Other | Admitting: *Deleted

## 2010-08-16 ENCOUNTER — Other Ambulatory Visit: Payer: Medicare Other | Admitting: *Deleted

## 2010-08-17 LAB — GLUCOSE, CAPILLARY
Glucose-Capillary: 126 mg/dL — ABNORMAL HIGH (ref 70–99)
Glucose-Capillary: 160 mg/dL — ABNORMAL HIGH (ref 70–99)

## 2010-08-17 LAB — BASIC METABOLIC PANEL
CO2: 31 mEq/L (ref 19–32)
Calcium: 10.3 mg/dL (ref 8.4–10.5)
Chloride: 103 mEq/L (ref 96–112)
GFR calc Af Amer: >60 mL/min (ref >60)
Potassium: 4.2 mEq/L (ref 3.5–5.1)
Sodium: 141 mEq/L (ref 135–145)

## 2010-08-17 LAB — PROTIME-INR: INR: 2.3 — ABNORMAL HIGH (ref 0.00–1.49)

## 2010-08-17 LAB — CBC
Hemoglobin: 13 g/dL (ref 13.0–17.0)
MCHC: 32.9 g/dL (ref 30.0–36.0)
RBC: 4.31 MIL/uL (ref 4.22–5.81)

## 2010-08-17 LAB — APTT: aPTT: 39 seconds — ABNORMAL HIGH (ref 24–37)

## 2010-08-17 LAB — DIFFERENTIAL
Basophils Relative: 1 % (ref 0–1)
Monocytes Absolute: 0.4 10*3/uL (ref 0.1–1.0)
Monocytes Relative: 7 % (ref 3–12)
Neutro Abs: 3.9 10*3/uL (ref 1.7–7.7)

## 2010-08-20 ENCOUNTER — Ambulatory Visit: Payer: Medicare Other | Admitting: Family Medicine

## 2010-08-20 LAB — GLUCOSE, CAPILLARY: Glucose-Capillary: 151 mg/dL — ABNORMAL HIGH (ref 70–99)

## 2010-08-21 ENCOUNTER — Ambulatory Visit (INDEPENDENT_AMBULATORY_CARE_PROVIDER_SITE_OTHER): Payer: Medicare Other | Admitting: Family Medicine

## 2010-08-21 ENCOUNTER — Encounter: Payer: Self-pay | Admitting: Family Medicine

## 2010-08-21 ENCOUNTER — Telehealth: Payer: Self-pay | Admitting: Cardiology

## 2010-08-21 ENCOUNTER — Ambulatory Visit: Payer: Medicare Other | Admitting: Cardiology

## 2010-08-21 DIAGNOSIS — E119 Type 2 diabetes mellitus without complications: Secondary | ICD-10-CM

## 2010-08-21 DIAGNOSIS — E611 Iron deficiency: Secondary | ICD-10-CM

## 2010-08-21 DIAGNOSIS — Z13820 Encounter for screening for osteoporosis: Secondary | ICD-10-CM

## 2010-08-21 DIAGNOSIS — C61 Malignant neoplasm of prostate: Secondary | ICD-10-CM

## 2010-08-21 DIAGNOSIS — E039 Hypothyroidism, unspecified: Secondary | ICD-10-CM

## 2010-08-21 DIAGNOSIS — D510 Vitamin B12 deficiency anemia due to intrinsic factor deficiency: Secondary | ICD-10-CM

## 2010-08-21 DIAGNOSIS — I4891 Unspecified atrial fibrillation: Secondary | ICD-10-CM

## 2010-08-21 DIAGNOSIS — D509 Iron deficiency anemia, unspecified: Secondary | ICD-10-CM

## 2010-08-21 LAB — CBC WITH DIFFERENTIAL/PLATELET
Eosinophils Relative: 1.8 % (ref 0.0–5.0)
HCT: 39.6 % (ref 39.0–52.0)
Hemoglobin: 13.1 g/dL (ref 13.0–17.0)
Lymphs Abs: 0.7 10*3/uL (ref 0.7–4.0)
Monocytes Relative: 6.1 % (ref 3.0–12.0)
Neutro Abs: 5.4 10*3/uL (ref 1.4–7.7)
WBC: 6.7 10*3/uL (ref 4.5–10.5)

## 2010-08-21 LAB — BASIC METABOLIC PANEL
BUN: 37 mg/dL — ABNORMAL HIGH (ref 6–23)
CO2: 28 mEq/L (ref 19–32)
Chloride: 104 mEq/L (ref 96–112)
Creatinine, Ser: 1.5 mg/dL (ref 0.4–1.5)
GFR calc Af Amer: 56 mL/min — ABNORMAL LOW (ref >60)
Potassium: 5.4 mEq/L — ABNORMAL HIGH (ref 3.5–5.1)

## 2010-08-21 LAB — PROTIME-INR: INR: 2.8 ratio — ABNORMAL HIGH (ref 0.8–1.0)

## 2010-08-21 LAB — HEMOGLOBIN A1C: Hgb A1c MFr Bld: 6.1 % (ref 4.6–6.5)

## 2010-08-21 MED ORDER — CYANOCOBALAMIN 1000 MCG/ML IJ SOLN
1000.0000 ug | Freq: Once | INTRAMUSCULAR | Status: AC
  Administered 2010-08-21: 1000 ug via INTRAMUSCULAR

## 2010-08-21 NOTE — Assessment & Plan Note (Signed)
Patient takes 2-3 tums per day.  I recommended he start a daily MVI for his vit D. I will order bone densitometry on him to see if bisphosphonate is needed.

## 2010-08-21 NOTE — Assessment & Plan Note (Signed)
His urologist will be doing a routine surveillance biopsy (has been 2 yrs since the last one per pt) 09/03/10 and Stephane will stop aspirin and coumadin for 5 days prior to the procedure.

## 2010-08-21 NOTE — Progress Notes (Signed)
Office Note 08/21/2010  CC:  Chief Complaint  Patient presents with  . Hypertension  . Diabetes    HPI:  Roberto [name] is a 75 y.o. White male who is here for f/u.  He has a prostate biopsy scheduled for 09/03/10 with [facility]. He'll have to hold his aspirin and coumadin for 5d prior to the procedure.  He is due for recheck of his iron labs, HbA1c, and TSH today and he is due for his vit B12 injection. Feeling pretty well, just some intermittent feeling of right knee giving way--no pain.  Uses a cain and it helps stabilize him well. As far as I can see he has no imaging/documentation to support dx of osteoporosis or osteopenia.  Glucose checks at home still largely in normal range--fasting.     Past Medical History  Diagnosis Date  . CAD (coronary artery disease)     post CABG with prior percutaneous intervention of teh saphenous vein graft with known saphenous vein graft disease.  Marland Kitchen AAA (abdominal aortic aneurysm) 2010    5.8cm  . Aneurysm of thoracic aorta   . Popliteal artery aneurysm   . Myocardial infarction   . Ischemic cardiomyopathy     EF 40%  . Paroxysmal atrial fibrillation   . Hypertension   . Peripheral vascular disease   . Dyslipidemia   . Diabetes mellitus   . GERD (gastroesophageal reflux disease)   . Hiatal hernia   . Diverticular disease   . BPH (benign prostatic hypertrophy)   . COPD (chronic obstructive pulmonary disease)     05/09/08 PFT FEV1 1.84 (55%), FVC 4.42 (95%), TLC 6.46 (92%), DLCO 72%, no BD response  . Pneumonia 1/09  . Allergy   . Shingles   . OSA (obstructive sleep apnea) 06/14/09    PSG RDI 17, PLMI 96, intolerant of CPAP or BPAP    Past Surgical History  Procedure Date  . Cardiac defibrillator placement   . Colon surgery 1995    partial colectomy  . Coronary artery bypass graft 1996  . Aorta - bilateral femoral artery bypass graft 1999    [name] Early  . Nose surgery     Family History  Problem Relation Age  of Onset  . Stroke Mother 70  . Cancer Father 24    Lung  . Heart attack Father 8  . Coronary artery disease      History   Social History  . Marital Status: Married    Spouse Name: N/A    Number of Children: N/A  . Years of Education: N/A   Occupational History  . Not on file.   Social History Main Topics  . Smoking status: Former Games developer  . Smokeless tobacco: Former Neurosurgeon    Quit date: 05/14/2003  . Alcohol Use: 0.0 oz/week    0 Cans of beer per week  . Drug Use: No  . Sexually Active:    Other Topics Concern  . Not on file   Social History Narrative  . No narrative on file    MEDS: his levothyroxine dosing is tab, 1 qd alt w/ 1/2 qd                  Outpatient Prescriptions Prior to Visit  Medication Sig Dispense Refill  . acetaminophen (TYLENOL) 325 MG tablet Take 325 mg by mouth as needed.        Marland Kitchen amiodarone (PACERONE) 200 MG tablet Take 200 mg by mouth daily.        Marland Kitchen  aspirin 81 MG tablet Take 81 mg by mouth daily.        . budesonide-formoterol (SYMBICORT) 160-4.5 MCG/ACT inhaler Inhale 2 puffs into the lungs 2 (two) times daily.        . carvedilol (COREG) 12.5 MG tablet Take 6.25 mg by mouth 2 (two) times daily with meals.        . cetirizine (ZYRTEC) 10 MG tablet Take 10 mg by mouth daily.        . clotrimazole-betamethasone (LOTRISONE) cream Apply topically 2 (two) times daily.        . cyanocobalamin 1000 MCG/ML injection Inject into the muscle every 30 (thirty) days.        Marland Kitchen desoximetasone (TOPICORT) 0.25 % cream Apply topically as needed.        . finasteride (PROSCAR) 5 MG tablet Take 5 mg by mouth daily.        . furosemide (LASIX) 40 MG tablet Take 40 mg by mouth 2 (two) times daily.        Marland Kitchen griseofulvin (GRIFULVIN V) 500 MG tablet Take 500 mg by mouth daily.        Marland Kitchen guaiFENesin-dextromethorphan (ROBITUSSIN DM) 100-10 MG/5ML syrup As needed for cough       . HYDROcodone-homatropine (HYDROMET) 5-1.5 MG/5ML syrup Take 1/2-1 tsp every 6 hour  as needed for cough       . isosorbide mononitrate (IMDUR) 30 MG 24 hr tablet Take 30 mg by mouth daily.        Marland Kitchen levalbuterol (XOPENEX HFA) 45 MCG/ACT inhaler Inhale 1-2 puffs into the lungs every 6 (six) hours as needed.  1 Inhaler  6  . levothyroxine (SYNTHROID, LEVOTHROID) 125 MCG tablet Take 125 mcg by mouth daily.        Marland Kitchen lithium 300 MG capsule Take 300 mg by mouth 2 (two) times daily with meals.        . mometasone (NASONEX) 50 MCG/ACT nasal spray 2 sprays by Nasal route 2 (two) times daily.        . nitroGLYCERIN (NITROSTAT) 0.3 MG SL tablet Place 0.3 mg under the tongue every 5 (five) minutes as needed.        Marland Kitchen olmesartan (BENICAR) 20 MG tablet Take 1/2 tablet by mouth once daily       . potassium chloride (KLOR-CON) 10 MEQ CR tablet Take 10 mEq by mouth daily.        . pseudoephedrine-codeine-guaifenesin (GUAIFENESIN DAC) 30-10-100 MG/5ML solution        . sitaGLIPtan (JANUVIA) 100 MG tablet Take 100 mg by mouth daily.        . Tamsulosin HCl (FLOMAX) 0.4 MG CAPS Take 0.4 mg by mouth daily.        Marland Kitchen tiotropium (SPIRIVA) 18 MCG inhalation capsule Place 18 mcg into inhaler and inhale daily.        Marland Kitchen warfarin (COUMADIN) 5 MG tablet Take as directed by coumadin clinic        No facility-administered medications prior to visit.    Allergies  Allergen Reactions  . Atorvastatin     REACTION: muscle ache in legs  . Morphine     REACTION: AMS - agitation  . Prednisone     REACTION: Can't breath  . Zolpidem Tartrate     REACTION: AMS    PE; Blood pressure 128/80, pulse 61, height 5' 10.5" (1.791 m), weight 168 lb (76.204 kg), SpO2 93.00%. Gen: Alert, well appearing.  Patient is oriented to person, place, time,  and situation. CV: RRR, no murmur LUNGS: CTA bilat except coarse end-exp wheeze in right base that doesn't clear with coughing.   Pertinent labs:  None today  ASSESSMENT AND PLAN:   DIABETES MELLITUS, TYPE II Problem stable.  Continue current medications and diet  appropriate for this condition.  We have reviewed our general long term plan for this problem and also reviewed symptoms and signs that should prompt the patient to call or return to the office. Check HbA1c today.  His D.R screening is UTD.  Urine microalbumin last month was negative.  ATRIAL FIBRILLATION Regular rhythm today.   Problem stable.  Continue current medications and diet appropriate for this condition.  We have reviewed our general long term plan for this problem and also reviewed symptoms and signs that should prompt the patient to call or return to the office. He has f/u with Dr. Riley Kill later on today.  For convenience, he prefers to start getting protimes checked here at our office, so I did this test today.  PROSTATE CANCER His urologist will be doing a routine surveillance biopsy (has been 2 yrs since the last one per pt) 09/03/10 and Roberto [name] will stop aspirin and coumadin for 5 days prior to the procedure.  IRON DEFICIENCY Dx'd 05/2010.  He's been on iron replacement (325mg  bid) now for about 9 wks .  Ferritin and %sat had come up into normal range on f/u testing about a month ago.  Will check CBC and iron tests again today. Hemoccults were negative, and his GI MD ([name]) recommended no procedure at this time but simply cautious obs for now.  PERNICIOUS ANEMIA Problem stable.  Continue current medications and diet appropriate for this condition.  We have reviewed our general long term plan for this problem and also reviewed symptoms and signs that should prompt the patient to call or return to the office. Vit B12 1000 mcg IM given today.  Screening for osteoporosis Patient takes 2-3 tums per day.  I recommended he start a daily MVI for his vit D. I will order bone densitometry on him to see if bisphosphonate is needed.     FOLLOW UP:  Return in about 3 months (around 11/20/2010).

## 2010-08-21 NOTE — Telephone Encounter (Signed)
Left message to call back  

## 2010-08-21 NOTE — Assessment & Plan Note (Addendum)
Regular rhythm today.   Problem stable.  Continue current medications and diet appropriate for this condition.  We have reviewed our general long term plan for this problem and also reviewed symptoms and signs that should prompt the patient to call or return to the office. He has f/u with Dr. Riley Kill later on today.  For convenience, he prefers to start getting protimes checked here at our office, so I did this test today.

## 2010-08-21 NOTE — Assessment & Plan Note (Signed)
Problem stable.  Continue current medications and diet appropriate for this condition.  We have reviewed our general long term plan for this problem and also reviewed symptoms and signs that should prompt the patient to call or return to the office. Vit B12 1000 mcg IM given today.

## 2010-08-21 NOTE — Assessment & Plan Note (Signed)
Problem stable.  Continue current medications and diet appropriate for this condition.  We have reviewed our general long term plan for this problem and also reviewed symptoms and signs that should prompt the patient to call or return to the office. Check HbA1c today.  His D.R screening is UTD.  Urine microalbumin last month was negative.

## 2010-08-21 NOTE — Assessment & Plan Note (Addendum)
Dx'd 05/2010.  He's been on iron replacement (325mg  bid) now for about 9 wks .  Ferritin and %sat had come up into normal range on f/u testing about a month ago.  Will check CBC and iron tests again today. Hemoccults were negative, and his GI MD ([name]) recommended no procedure at this time but simply cautious obs for now.

## 2010-08-22 ENCOUNTER — Telehealth: Payer: Self-pay | Admitting: *Deleted

## 2010-08-22 NOTE — Telephone Encounter (Signed)
I spoke with the pt and he is scheduled for a prostate biopsy on 09/03/10 with Dr Vonita Moss.  They would like the pt to hold his ASA and Coumadin 5 days prior to biopsy.  The pt can be reached at [phone] [phone].  I will discuss this pt with Dr Riley Kill.

## 2010-08-22 NOTE — Telephone Encounter (Signed)
Pt notified of lab results.  Pt is agreeable.  He states he is only taking one iron tablet.  Advised pt to continue with that dose.  Med list updated with correct coumadin and iron instructions.

## 2010-08-22 NOTE — Telephone Encounter (Signed)
Message copied by Francee Piccolo on Wed Aug 22, 2010 11:25 AM ------      Message from: Nicoletta Ba      Created: Tue Aug 21, 2010  5:34 PM       Please notify: all labs came back good.  He can now decrease his iron to one tab once per day instead of twice per day.        He knows to stop taking his coumadin and aspirin 5 days before his upcoming prostate biopsy.  Let him know that when the urologist gives him the go-ahead to restart them, he can restart taking them at the same dose he takes them now.  Also, since we are doing his coumadin dosing now, please ask him to tell you his dosing schedule so we can document it in his med list.  Thanks!

## 2010-08-23 ENCOUNTER — Emergency Department (HOSPITAL_COMMUNITY): Payer: Medicare Other

## 2010-08-23 ENCOUNTER — Inpatient Hospital Stay (HOSPITAL_COMMUNITY)
Admission: EM | Admit: 2010-08-23 | Discharge: 2010-08-25 | DRG: 281 | Disposition: A | Discharge status: Home / Self Care | Payer: Medicare Other | Attending: Cardiology | Admitting: Cardiology

## 2010-08-23 DIAGNOSIS — F319 Bipolar disorder, unspecified: Secondary | ICD-10-CM | POA: Diagnosis present

## 2010-08-23 DIAGNOSIS — Z87891 Personal history of nicotine dependence: Secondary | ICD-10-CM

## 2010-08-23 DIAGNOSIS — Z95810 Presence of automatic (implantable) cardiac defibrillator: Secondary | ICD-10-CM

## 2010-08-23 DIAGNOSIS — E785 Hyperlipidemia, unspecified: Secondary | ICD-10-CM | POA: Diagnosis present

## 2010-08-23 DIAGNOSIS — I2582 Chronic total occlusion of coronary artery: Secondary | ICD-10-CM | POA: Diagnosis present

## 2010-08-23 DIAGNOSIS — I2589 Other forms of chronic ischemic heart disease: Secondary | ICD-10-CM | POA: Diagnosis present

## 2010-08-23 DIAGNOSIS — I4891 Unspecified atrial fibrillation: Secondary | ICD-10-CM | POA: Diagnosis present

## 2010-08-23 DIAGNOSIS — N183 Chronic kidney disease, stage 3 unspecified: Secondary | ICD-10-CM | POA: Diagnosis present

## 2010-08-23 DIAGNOSIS — Z8249 Family history of ischemic heart disease and other diseases of the circulatory system: Secondary | ICD-10-CM

## 2010-08-23 DIAGNOSIS — R079 Chest pain, unspecified: Secondary | ICD-10-CM

## 2010-08-23 DIAGNOSIS — I4892 Unspecified atrial flutter: Secondary | ICD-10-CM | POA: Diagnosis present

## 2010-08-23 DIAGNOSIS — I25810 Atherosclerosis of coronary artery bypass graft(s) without angina pectoris: Secondary | ICD-10-CM | POA: Diagnosis present

## 2010-08-23 DIAGNOSIS — Z8546 Personal history of malignant neoplasm of prostate: Secondary | ICD-10-CM

## 2010-08-23 DIAGNOSIS — I509 Heart failure, unspecified: Secondary | ICD-10-CM | POA: Diagnosis present

## 2010-08-23 DIAGNOSIS — Z888 Allergy status to other drugs, medicaments and biological substances status: Secondary | ICD-10-CM

## 2010-08-23 DIAGNOSIS — Z7982 Long term (current) use of aspirin: Secondary | ICD-10-CM

## 2010-08-23 DIAGNOSIS — Z7901 Long term (current) use of anticoagulants: Secondary | ICD-10-CM

## 2010-08-23 DIAGNOSIS — I214 Non-ST elevation (NSTEMI) myocardial infarction: Principal | ICD-10-CM | POA: Diagnosis present

## 2010-08-23 DIAGNOSIS — I712 Thoracic aortic aneurysm, without rupture, unspecified: Secondary | ICD-10-CM | POA: Diagnosis present

## 2010-08-23 DIAGNOSIS — Z79899 Other long term (current) drug therapy: Secondary | ICD-10-CM

## 2010-08-23 DIAGNOSIS — J4489 Other specified chronic obstructive pulmonary disease: Secondary | ICD-10-CM | POA: Diagnosis present

## 2010-08-23 DIAGNOSIS — I129 Hypertensive chronic kidney disease with stage 1 through stage 4 chronic kidney disease, or unspecified chronic kidney disease: Secondary | ICD-10-CM | POA: Diagnosis present

## 2010-08-23 DIAGNOSIS — I5022 Chronic systolic (congestive) heart failure: Secondary | ICD-10-CM | POA: Diagnosis present

## 2010-08-23 DIAGNOSIS — I2510 Atherosclerotic heart disease of native coronary artery without angina pectoris: Secondary | ICD-10-CM | POA: Diagnosis present

## 2010-08-23 DIAGNOSIS — I739 Peripheral vascular disease, unspecified: Secondary | ICD-10-CM | POA: Diagnosis present

## 2010-08-23 DIAGNOSIS — I252 Old myocardial infarction: Secondary | ICD-10-CM

## 2010-08-23 DIAGNOSIS — Z9861 Coronary angioplasty status: Secondary | ICD-10-CM

## 2010-08-23 DIAGNOSIS — J449 Chronic obstructive pulmonary disease, unspecified: Secondary | ICD-10-CM | POA: Diagnosis present

## 2010-08-23 DIAGNOSIS — E119 Type 2 diabetes mellitus without complications: Secondary | ICD-10-CM | POA: Diagnosis present

## 2010-08-23 LAB — BASIC METABOLIC PANEL
Calcium: 9.2 mg/dL (ref 8.4–10.5)
Creatinine, Ser: 1.24 mg/dL (ref 0.4–1.5)
GFR calc Af Amer: >60 mL/min (ref >60)
GFR calc non Af Amer: 57 mL/min — ABNORMAL LOW (ref >60)
Sodium: 140 mEq/L (ref 135–145)

## 2010-08-23 LAB — CK TOTAL AND CKMB (NOT AT ARMC)
CK, MB: 2.1 ng/mL (ref 0.3–4.0)
Relative Index: INVALID (ref 0.0–2.5)
Total CK: 61 U/L (ref 7–232)

## 2010-08-23 LAB — GLUCOSE, CAPILLARY: Glucose-Capillary: 107 mg/dL — ABNORMAL HIGH (ref 70–99)

## 2010-08-23 LAB — PROTIME-INR
INR: 2.74 — ABNORMAL HIGH (ref 0.00–1.49)
Prothrombin Time: 29.1 seconds — ABNORMAL HIGH (ref 11.6–15.2)

## 2010-08-23 LAB — CBC
MCV: 93.2 fL (ref 78.0–100.0)
Platelets: 152 10*3/uL (ref 150–400)
RBC: 4.26 MIL/uL (ref 4.22–5.81)
RDW: 15.9 % — ABNORMAL HIGH (ref 11.5–15.5)
WBC: 7.4 10*3/uL (ref 4.0–10.5)

## 2010-08-23 LAB — DIFFERENTIAL
Basophils Absolute: 0 10*3/uL (ref 0.0–0.1)
Eosinophils Absolute: 0.1 10*3/uL (ref 0.0–0.7)
Eosinophils Relative: 1 % (ref 0–5)
Lymphs Abs: 0.6 10*3/uL — ABNORMAL LOW (ref 0.7–4.0)

## 2010-08-23 LAB — CARDIAC PANEL(CRET KIN+CKTOT+MB+TROPI)
CK, MB: 2.9 ng/mL (ref 0.3–4.0)
Troponin I: 0.3 ng/mL — ABNORMAL HIGH (ref 0.00–0.06)

## 2010-08-24 LAB — GLUCOSE, CAPILLARY
Glucose-Capillary: 104 mg/dL — ABNORMAL HIGH (ref 70–99)
Glucose-Capillary: 127 mg/dL — ABNORMAL HIGH (ref 70–99)

## 2010-08-24 LAB — CARDIAC PANEL(CRET KIN+CKTOT+MB+TROPI)
CK, MB: 2.7 ng/mL (ref 0.3–4.0)
Relative Index: INVALID (ref 0.0–2.5)
Total CK: 64 U/L (ref 7–232)
Troponin I: 0.2 ng/mL — ABNORMAL HIGH (ref 0.00–0.06)

## 2010-08-25 DIAGNOSIS — I214 Non-ST elevation (NSTEMI) myocardial infarction: Secondary | ICD-10-CM

## 2010-08-25 LAB — PROTIME-INR
INR: 2.3 — ABNORMAL HIGH (ref 0.00–1.49)
Prothrombin Time: 25.4 s — ABNORMAL HIGH (ref 11.6–15.2)

## 2010-08-28 ENCOUNTER — Telehealth: Payer: Self-pay | Admitting: Cardiology

## 2010-08-28 NOTE — Telephone Encounter (Signed)
Please see documentation from previous phone note in regards to pt's prostate biopsy.

## 2010-08-28 NOTE — Telephone Encounter (Signed)
I spoke with Dr Riley Kill and this pt was recently admitted to the hospital. Dr Riley Kill does not want him to discontinue Coumadin and ASA at this time.  The pt would like me to contact Dr Enos Fling office to let them know that he cannot proceed with prostate biopsy. I will call Dr Enos Fling office tomorrow in regards to biopsy.

## 2010-08-29 ENCOUNTER — Telehealth: Payer: Self-pay | Admitting: Cardiology

## 2010-08-29 NOTE — Telephone Encounter (Signed)
Left message for [facility]) to call back.

## 2010-08-29 NOTE — Telephone Encounter (Signed)
Left message for Alcario Drought at [facility] to call me back to discuss the pt's prostate biopsy.

## 2010-08-29 NOTE — Telephone Encounter (Signed)
Needs ok for pt to come off coumadin for 5 days prior to prostate bx-pls fax [phone]

## 2010-08-29 NOTE — Telephone Encounter (Signed)
I spoke with Roberto [name] and made her aware that Dr Riley Kill does not want this pt having prostate biopsy at this time due to recent hospitalization. The pt has a pending appt with Dr Riley Kill on 09/13/10 and Dr Enos Fling office would like Dr Riley Kill to make a decision about prostate biopsy at that time.

## 2010-08-29 NOTE — Telephone Encounter (Signed)
I spoke with Roberto [name] and made her aware that Dr Stuckey does not want this pt having prostate biopsy at this time due to recent hospitalization. The pt has a pending appt with Dr Stuckey on 09/13/10 and Dr Peterson's office would like Dr Stuckey to make a decision about prostate biopsy at that time.   

## 2010-08-30 ENCOUNTER — Other Ambulatory Visit: Payer: Self-pay | Admitting: *Deleted

## 2010-08-30 ENCOUNTER — Encounter: Payer: Self-pay | Admitting: Family Medicine

## 2010-08-30 MED ORDER — TIOTROPIUM BROMIDE MONOHYDRATE 18 MCG IN CAPS: 18.0000 ug | ORAL_CAPSULE | Freq: Every day | RESPIRATORY_TRACT | Status: DC

## 2010-08-30 NOTE — Discharge Summary (Signed)
NAMECARVER, MURAKAMI               ACCOUNT NO.:  [id number]  MEDICAL RECORD NO.:  [id number]           PATIENT TYPE:  I  LOCATION:  2019                         FACILITY:  [facility]  PHYSICIAN:  Noralyn Pick. Eden Emms, MD, FACCDATE OF BIRTH:  [birth year]  DATE OF ADMISSION:  08/23/2010 DATE OF DISCHARGE:  08/25/2010                              DISCHARGE SUMMARY   PROCEDURE:  Two-view chest x-ray.  PRIMARY FINAL DISCHARGE DIAGNOSIS:  Non-ST-segment elevation myocardial infarction, medical therapy recommended for coronary artery disease.  SECONDARY DIAGNOSES: 1. Chronic anticoagulation with Coumadin, INR 2.3 at discharge. 2. Status post aortocoronary bypass surgery in 1995 with left internal     mammary artery to left anterior descending, saphenous vein graft to     first obtuse marginal artery and second obtuse marginal artery ,     saphenous vein graft to posterior descending artery. 3. Status post cardiac catheterization in June 2011 with left internal     mammary artery to left anterior descending patent, circumflex 70%,     first obtuse marginal artery totalled, second obtuse marginal     artery 90%, posterolateral 95%, right coronary artery totalled,     saphenous vein graft to diagonal 40%, all other grafts totalled. 4. Ischemic cardiomyopathy with an ejection fraction of 20-25% by     echocardiogram in June 2011, diastolic dysfunction and right     ventricular systolic dysfunction noted. 5. Non-ST-segment elevation myocardial infarction in 2001 with a stent     to the vein graft to first obtuse marginal artery and second obtuse     marginal artery. 6. History of ischemic cardiomyopathy, status post insertion of a     [employer] Maximo ICD in April 2006 with a 6949 lead replacement in     2010. 7. History of paroxysmal atrial fibrillation and atrial flutter,     status post ablation of the flutter. 8. Peripheral arterial disease. 9. Thoracic aneurysm 6.0 cm in October  2011. 10.Chronic obstructive pulmonary disease. 11.Bipolar disorder. 12.Chronic kidney disease, stage III. 13.History of diverticulitis. 14.Diabetes with a hemoglobin A1c of 6.1 on August 21, 2010. 15.Hypertension. 16.Hyperlipidemia. 17.Remote tobacco use. 18.Family history of coronary artery disease. 19.History of prostate cancer. 20.Status post abdominal aortic aneurysm repair, aortobifemoral,     partial colectomy. 21.Allergy or intolerance to CRESTOR, AVELOX, AMBIEN, MORPHINE,     PREDNISONE, ENALAPRIL, ALDACTONE, and LIPITOR.  TIME AT DISCHARGE:  41 minutes.  HOSPITAL COURSE:  Mr. Madariaga is a 75 year old male with a history of coronary artery disease.  He had chest pain on the day of admission and when it did not resolve he came to the hospital where he was admitted for further evaluation.  He had some elevation in his troponins up to 0.3, but his CKs all remained negative.  His symptoms resolved on medical therapy.  He was followed by Dr. Riley Kill and his medications were adjusted with an increase in his Imdur.  He was seen by Cardiac Rehab and followed by Pharmacy for his Coumadin.  On August 25, 2010, Mr. Waymire had no recurrent chest pain.  His vital signs were stable.  He had a  mild headache from the increased Imdur, but was tolerating it fairly well. Dr. Eden Emms considered Mr. Kobler stable for discharge, to follow up as an outpatient.  DISCHARGE INSTRUCTIONS: 1. His activity level is to be increased gradually. 2. He is encouraged to stick to a low-sodium, diabetic diet. 3. He is to follow up with Dr. Riley Kill and we will call him with an     appointment. 4. He is to follow up with Dr. Milinda Cave as needed.  DISCHARGE MEDICATIONS: 1. Finasteride 5 mg 1/2 tablet daily. 2. Budesonide nebulizers b.i.d. 3. Benicar 20 mg 1/2 tablet daily. 4. Tylenol 650 mg q.4 h. p.r.n. 5. Amiodarone 200 mg a day. 6. Coumadin 5 mg as directed. 7. Lithium 300 mg b.i.d. 8. Spiriva  daily. 9. Coreg 3.125 mg b.i.d. 10.Januvia 100 mg daily. 11.Iron is on hold. 12.Lasix 40 mg 2 tablets daily. 13.Imdur 60 mg daily. 14.Nitroglycerin sublingual p.r.n. 15.Aspirin 81 mg a day. 16.Prilosec 40 mg daily. 17.Klor-Con 10 mEq daily. 18.Zyrtec 10 mg a day. 19.Flomax 0.4 mg daily. 20.Albuterol nebulizers p.r.n. 21.Xopenex inhaler q.i.d. p.r.n. 22.Levothyroxine 125 mcg 1/2 tablet daily. 23.Vitamin B12 1000 mcg monthly injected.     Theodore Demark, PA-C   ______________________________ Noralyn Pick. Eden Emms, MD, [facility]    RB/MEDQ  D:  08/25/2010  T:  08/26/2010  Job:  [id number]  cc:   Jeoffrey Massed, MD  Electronically Signed by Theodore Demark PA-C on 08/28/2010 01:34:12 PM Electronically Signed by Charlton Haws MD [facility] on 08/30/2010 11:31:39 AM

## 2010-09-02 ENCOUNTER — Encounter: Payer: Self-pay | Admitting: Family Medicine

## 2010-09-03 ENCOUNTER — Other Ambulatory Visit: Payer: Self-pay | Admitting: Vascular Surgery

## 2010-09-03 DIAGNOSIS — R978 Other abnormal tumor markers: Secondary | ICD-10-CM

## 2010-09-03 DIAGNOSIS — I712 Thoracic aortic aneurysm, without rupture: Secondary | ICD-10-CM

## 2010-09-07 ENCOUNTER — Encounter: Payer: Self-pay | Admitting: Pulmonary Disease

## 2010-09-10 ENCOUNTER — Ambulatory Visit (INDEPENDENT_AMBULATORY_CARE_PROVIDER_SITE_OTHER): Payer: Medicare Other | Admitting: Pulmonary Disease

## 2010-09-10 ENCOUNTER — Encounter: Payer: Self-pay | Admitting: Pulmonary Disease

## 2010-09-10 VITALS — BP 118/68 | HR 61 | Temp 97.8°F | Ht 71.0 in | Wt 172.8 lb

## 2010-09-10 DIAGNOSIS — J310 Chronic rhinitis: Secondary | ICD-10-CM | POA: Insufficient documentation

## 2010-09-10 DIAGNOSIS — I712 Thoracic aortic aneurysm, without rupture: Secondary | ICD-10-CM

## 2010-09-10 DIAGNOSIS — J449 Chronic obstructive pulmonary disease, unspecified: Secondary | ICD-10-CM

## 2010-09-10 DIAGNOSIS — J309 Allergic rhinitis, unspecified: Secondary | ICD-10-CM

## 2010-09-10 DIAGNOSIS — G4733 Obstructive sleep apnea (adult) (pediatric): Secondary | ICD-10-CM

## 2010-09-10 DIAGNOSIS — R490 Dysphonia: Secondary | ICD-10-CM

## 2010-09-10 MED ORDER — IPRATROPIUM BROMIDE 0.03 % NA SOLN: NASAL | Status: DC

## 2010-09-10 NOTE — Patient Instructions (Signed)
Ipratropium (atrovent) nasal spray two sprays three times per day as needed before meals Follow up in 4 months

## 2010-09-10 NOTE — Progress Notes (Signed)
Subjective:    Patient ID: Roberto [name], male    DOB: [birth year], 75 y.o.   MRN: [id number] Copy to: [name], Adrian Saran  HPI 75 yo with known history of COPD, and rhinitis.  He was in the hospital again 2 weeks ago with chest pain.  He had his isosorbide dose increased.  He has not experienced chest pain since.  His breathing is doing okay.  He still gets winded and fatigued easily with brisk activity.  He is not having much cough, sputum, wheeze, sore throat, or hoarseness.  His allergies seem to be worse.  He gets frequent episodes of runny nose while eating.  This can be embarrassing when he is in a large group.  Past Medical History  Diagnosis Date  . CAD (coronary artery disease)     post CABG with prior percutaneous intervention of teh saphenous vein graft with known saphenous vein graft disease.  Marland Kitchen AAA (abdominal aortic aneurysm) 2010    5.8cm  . Aneurysm of thoracic aorta 1995    Rupture 1995 w/spontaneous resolution  . Popliteal artery aneurysm   . Myocardial infarction   . Ischemic cardiomyopathy     EF 40%  . Paroxysmal atrial fibrillation   . Hypertension   . Peripheral vascular disease   . Dyslipidemia   . Diabetes mellitus 2007    dx'd 2007 w/persisting FBS>125  . GERD (gastroesophageal reflux disease)   . Hiatal hernia   . Diverticular disease   . BPH (benign prostatic hypertrophy)   . COPD (chronic obstructive pulmonary disease)     05/09/08 PFT FEV1 1.84 (55%), FVC 4.42 (95%), TLC 6.46 (92%), DLCO 72%, no BD response  . Pneumonia 1/09  . Allergy   . Shingles   . OSA (obstructive sleep apnea) 06/14/09    PSG RDI 17, PLMI 96, intolerant of CPAP or BPAP     Family History  Problem Relation Age of Onset  . Stroke Mother 38  . Cancer Father 29    Lung  . Heart attack Father 52  . Coronary artery disease       History   Social History  . Marital Status: Married    Spouse Name: N/A    Number of Children: N/A  .  Years of Education: N/A   Occupational History  . Not on file.   Social History Main Topics  . Smoking status: Former Smoker -- 1.0 packs/day for 30 years    Types: Cigarettes    Quit date: 05/23/2003  . Smokeless tobacco: Former Neurosurgeon    Types: Chew    Quit date: 05/14/2003  . Alcohol Use: 0.6 oz/week    1 Cans of beer per week     only occ  . Drug Use: No  . Sexually Active: Not on file   Other Topics Concern  . Not on file   Social History Narrative  . No narrative on file     Allergies  Allergen Reactions  . Atorvastatin     REACTION: muscle ache in legs  . Crestor (Rosuvastatin Calcium) Other (See Comments)    Lower ext fatigue and soreness  . Lisinopril Diarrhea  . Morphine     REACTION: AMS - agitation  . Prednisone     REACTION: Can't breath  . Zolpidem Tartrate     REACTION: AMS     Outpatient Prescriptions Prior to Visit  Medication Sig Dispense Refill  . acetaminophen (TYLENOL) 325 MG tablet Take 325  mg by mouth as needed.        Marland Kitchen albuterol (PROVENTIL) (2.5 MG/3ML) 0.083% nebulizer solution Take 2.5 mg by nebulization every 6 (six) hours as needed.        Marland Kitchen amiodarone (PACERONE) 200 MG tablet Take 200 mg by mouth daily.        Marland Kitchen aspirin 81 MG tablet Take 81 mg by mouth daily.        . budesonide (PULMICORT) 0.25 MG/2ML nebulizer solution Take 0.25 mg by nebulization 2 (two) times daily.        . carvedilol (COREG) 12.5 MG tablet Take 6.25 mg by mouth 2 (two) times daily with meals.        . cetirizine (ZYRTEC) 10 MG tablet Take 10 mg by mouth daily.        . clotrimazole-betamethasone (LOTRISONE) cream Apply topically 2 (two) times daily.        . cyanocobalamin 1000 MCG/ML injection Inject into the muscle every 30 (thirty) days.        Marland Kitchen desoximetasone (TOPICORT) 0.25 % cream Apply topically as needed.        . ferrous sulfate 325 (65 FE) MG tablet Take 325 mg by mouth daily.       . finasteride (PROSCAR) 5 MG tablet Take 5 mg by mouth daily.        .  furosemide (LASIX) 40 MG tablet Take 40 mg by mouth 2 (two) times daily.        Marland Kitchen griseofulvin (GRIFULVIN V) 500 MG tablet Take 500 mg by mouth daily.        Marland Kitchen guaiFENesin-dextromethorphan (ROBITUSSIN DM) 100-10 MG/5ML syrup As needed for cough       . isosorbide mononitrate (IMDUR) 30 MG 24 hr tablet Take 60 mg by mouth daily.       Marland Kitchen levalbuterol (XOPENEX HFA) 45 MCG/ACT inhaler Inhale 1-2 puffs into the lungs every 6 (six) hours as needed.  1 Inhaler  6  . levothyroxine (SYNTHROID, LEVOTHROID) 125 MCG tablet Take 125 mcg by mouth every other day.       . levothyroxine (SYNTHROID, LEVOTHROID) 125 MCG tablet Take 62.5 mcg by mouth every other day.        . lithium 300 MG capsule Take 300 mg by mouth 2 (two) times daily with meals.        . mometasone (NASONEX) 50 MCG/ACT nasal spray 2 sprays by Nasal route 2 (two) times daily.        . Multiple Vitamin (MULTIVITAMIN) tablet Take 1 tablet by mouth daily.        . nitroGLYCERIN (NITROSTAT) 0.3 MG SL tablet Place 0.3 mg under the tongue every 5 (five) minutes as needed.        Marland Kitchen olmesartan (BENICAR) 20 MG tablet Take 1/2 tablet by mouth once daily       . potassium chloride (KLOR-CON) 10 MEQ CR tablet Take 10 mEq by mouth daily.        . pseudoephedrine-codeine-guaifenesin (GUAIFENESIN DAC) 30-10-100 MG/5ML solution        . sitaGLIPtan (JANUVIA) 100 MG tablet Take 100 mg by mouth daily.        . Tamsulosin HCl (FLOMAX) 0.4 MG CAPS Take 0.4 mg by mouth daily.        Marland Kitchen tiotropium (SPIRIVA) 18 MCG inhalation capsule Place 1 capsule (18 mcg total) into inhaler and inhale daily.  30 capsule  5  . warfarin (COUMADIN) 5 MG tablet Take 1 tablet (5mg )  by mouth on Friday.  Take 1/2 tablet (2.5mg ) all other days.       Review of Systems     Objective:   Physical Exam Filed Vitals:   09/10/10 1555  BP: 118/68  Pulse: 61  Temp: 97.8 F (36.6 C)  TempSrc: Oral  Height: 5\' 11"  (1.803 m)  Weight: 172 lb 12.8 oz (78.382 kg)  SpO2: 92%   General -  thin, no distress HEENT - No sinus tenderness, clear nasal drainage, no oral exudate, no LAN Cardiac - s1s2 irregular no murmur Chest - diminished breath sounds, no wheeze/rales Abd - soft, nontender Ext - no edema Neuro - normal strength, CN intact, A&O x 3 Psych - normal behavior/mood Skin - multiple ecchysmoses     CHEST - 2 VIEW 08/23/10:    Comparison: June 20, 2010    Findings: The pacemaker leads are stable in position over the right   atrium and right ventricle.  Cardiomegaly is unchanged.  The   mediastinum and pulmonary vasculature are within normal limits.   There is bibasilar atelectasis.    IMPRESSION:   Bibasilar atelectasis. Assessment & Plan:   COPD He is stable on his current inhaler and nebulizer regimen.  Explained that he will always likely have some degree of dyspnea with exertion, but that he should maintain his activity level as best he can.  ALLERGIC RHINITIS He is to continue with nasal irrigation and nasonex.  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  This does cause some discomfort and distress for him.  Will have him try atrovent nasal spray two sprays tid prn before meals.  ANEURYSM, THORACIC AORTIC He is scheduled for f/u CT chest later this month.    Updated Medication List Outpatient Encounter Prescriptions as of 09/10/2010  Medication Sig Dispense Refill  . acetaminophen (TYLENOL) 325 MG tablet Take 325 mg by mouth as needed.        Marland Kitchen albuterol (PROVENTIL) (2.5 MG/3ML) 0.083% nebulizer solution Take 2.5 mg by nebulization every 6 (six) hours as needed.        Marland Kitchen amiodarone (PACERONE) 200 MG tablet Take 200 mg by mouth daily.        Marland Kitchen aspirin 81 MG tablet Take 81 mg by mouth daily.        . budesonide (PULMICORT) 0.25 MG/2ML nebulizer solution Take 0.25 mg by nebulization 2 (two) times daily.        . carvedilol (COREG) 12.5 MG tablet Take 6.25 mg by mouth 2 (two) times daily with meals.        . cetirizine  (ZYRTEC) 10 MG tablet Take 10 mg by mouth daily.        . clotrimazole-betamethasone (LOTRISONE) cream Apply topically 2 (two) times daily.        . cyanocobalamin 1000 MCG/ML injection Inject into the muscle every 30 (thirty) days.        Marland Kitchen desoximetasone (TOPICORT) 0.25 % cream Apply topically as needed.        . ferrous sulfate 325 (65 FE) MG tablet Take 325 mg by mouth daily.       . finasteride (PROSCAR) 5 MG tablet Take 5 mg by mouth daily.        . furosemide (LASIX) 40 MG tablet Take 40 mg by mouth 2 (two) times daily.        Marland Kitchen griseofulvin (GRIFULVIN V) 500 MG tablet Take 500 mg by mouth daily.        Marland Kitchen guaiFENesin-dextromethorphan (  ROBITUSSIN DM) 100-10 MG/5ML syrup As needed for cough       . isosorbide mononitrate (IMDUR) 30 MG 24 hr tablet Take 60 mg by mouth daily.       Marland Kitchen levalbuterol (XOPENEX HFA) 45 MCG/ACT inhaler Inhale 1-2 puffs into the lungs every 6 (six) hours as needed.  1 Inhaler  6  . levothyroxine (SYNTHROID, LEVOTHROID) 125 MCG tablet Take 125 mcg by mouth every other day.       . levothyroxine (SYNTHROID, LEVOTHROID) 125 MCG tablet Take 62.5 mcg by mouth every other day.        . lithium 300 MG capsule Take 300 mg by mouth 2 (two) times daily with meals.        . mometasone (NASONEX) 50 MCG/ACT nasal spray 2 sprays by Nasal route 2 (two) times daily.        . Multiple Vitamin (MULTIVITAMIN) tablet Take 1 tablet by mouth daily.        . nitroGLYCERIN (NITROSTAT) 0.3 MG SL tablet Place 0.3 mg under the tongue every 5 (five) minutes as needed.        Marland Kitchen olmesartan (BENICAR) 20 MG tablet Take 1/2 tablet by mouth once daily       . potassium chloride (KLOR-CON) 10 MEQ CR tablet Take 10 mEq by mouth daily.        . pseudoephedrine-codeine-guaifenesin (GUAIFENESIN DAC) 30-10-100 MG/5ML solution        . sitaGLIPtan (JANUVIA) 100 MG tablet Take 100 mg by mouth daily.        . Tamsulosin HCl (FLOMAX) 0.4 MG CAPS Take 0.4 mg by mouth daily.        Marland Kitchen tiotropium (SPIRIVA) 18 MCG  inhalation capsule Place 1 capsule (18 mcg total) into inhaler and inhale daily.  30 capsule  5  . warfarin (COUMADIN) 5 MG tablet Take 1 tablet (5mg ) by mouth on Friday.  Take 1/2 tablet (2.5mg ) all other days.      Marland Kitchen ipratropium (ATROVENT) 0.03 % nasal spray Two sprays three times a day as needed before meals  30 mL  1

## 2010-09-11 ENCOUNTER — Encounter: Payer: Self-pay | Admitting: Family Medicine

## 2010-09-11 ENCOUNTER — Encounter: Payer: Self-pay | Admitting: Pulmonary Disease

## 2010-09-11 DIAGNOSIS — G4733 Obstructive sleep apnea (adult) (pediatric): Secondary | ICD-10-CM | POA: Insufficient documentation

## 2010-09-11 NOTE — Assessment & Plan Note (Signed)
He is stable on his current inhaler and nebulizer regimen.  Explained that he will always likely have some degree of dyspnea with exertion, but that he should maintain his activity level as best he can.

## 2010-09-11 NOTE — Assessment & Plan Note (Signed)
His symptoms are suggestive of gustatory rhinitis.  This does cause some discomfort and distress for him.  Will have him try atrovent nasal spray two sprays tid prn before meals.

## 2010-09-11 NOTE — Assessment & Plan Note (Signed)
He is to continue with nasal irrigation and nasonex.

## 2010-09-11 NOTE — Assessment & Plan Note (Signed)
He is scheduled for f/u CT chest later this month.

## 2010-09-13 ENCOUNTER — Encounter: Payer: Self-pay | Admitting: Cardiology

## 2010-09-13 ENCOUNTER — Ambulatory Visit (INDEPENDENT_AMBULATORY_CARE_PROVIDER_SITE_OTHER): Payer: Medicare Other | Admitting: Cardiology

## 2010-09-13 DIAGNOSIS — I219 Acute myocardial infarction, unspecified: Secondary | ICD-10-CM

## 2010-09-13 DIAGNOSIS — I4891 Unspecified atrial fibrillation: Secondary | ICD-10-CM

## 2010-09-13 DIAGNOSIS — I712 Thoracic aortic aneurysm, without rupture, unspecified: Secondary | ICD-10-CM

## 2010-09-13 DIAGNOSIS — C61 Malignant neoplasm of prostate: Secondary | ICD-10-CM

## 2010-09-13 DIAGNOSIS — I10 Essential (primary) hypertension: Secondary | ICD-10-CM

## 2010-09-13 NOTE — Assessment & Plan Note (Signed)
Continued mild anginal type symptoms.  Continue isosorbide dinatrate.

## 2010-09-13 NOTE — Assessment & Plan Note (Signed)
Scheduled for CT and to see Dr. Arbie Cookey.

## 2010-09-13 NOTE — Patient Instructions (Addendum)
Your physician recommends that you schedule a follow-up appointment in: 2 MONTHS  Your physician recommends that you continue on your current medications as directed. Please refer to the Current Medication list given to you today.  Per Dr Riley Kill the patient can hold coumadin 5 days prior to prostate biopsy.  Dr Riley Kill would like the patient to wait one month and see Dr Vonita Moss again prior to scheduling biopsy (the pt will call for appt) .

## 2010-09-13 NOTE — Assessment & Plan Note (Signed)
Controlled.  

## 2010-09-13 NOTE — Assessment & Plan Note (Signed)
Has been having biopsies.  Will have him see [name] first to decide if this needs to be done.

## 2010-09-13 NOTE — Assessment & Plan Note (Signed)
Maintaining NSR on Amiodarone.

## 2010-09-13 NOTE — Progress Notes (Signed)
HPI: Patient is in for followup.  He was admitted with non STEMI, but with atrial fib.  Now doing better, but is having some mild chest discomfort.  Planning to see Dr. Arbie Cookey soon.    Current Outpatient Prescriptions  Medication Sig Dispense Refill  . acetaminophen (TYLENOL) 325 MG tablet Take 325 mg by mouth as needed.        Marland Kitchen albuterol (PROVENTIL) (2.5 MG/3ML) 0.083% nebulizer solution Take 2.5 mg by nebulization every 6 (six) hours as needed.        Marland Kitchen amiodarone (PACERONE) 200 MG tablet Take 200 mg by mouth daily.        Marland Kitchen aspirin 81 MG tablet Take 81 mg by mouth daily.        . budesonide (PULMICORT) 0.25 MG/2ML nebulizer solution Take 0.25 mg by nebulization 2 (two) times daily.        . carvedilol (COREG) 12.5 MG tablet Take 6.25 mg by mouth 2 (two) times daily with meals.        . cetirizine (ZYRTEC) 10 MG tablet Take 10 mg by mouth daily.        . clotrimazole-betamethasone (LOTRISONE) cream Apply topically 2 (two) times daily.        . cyanocobalamin 1000 MCG/ML injection Inject into the muscle every 30 (thirty) days.        Marland Kitchen desoximetasone (TOPICORT) 0.25 % cream Apply topically as needed.        . ferrous sulfate 325 (65 FE) MG tablet Take 325 mg by mouth daily.       . finasteride (PROSCAR) 5 MG tablet Take 5 mg by mouth daily.        . furosemide (LASIX) 40 MG tablet Take 40 mg by mouth 2 (two) times daily.        Marland Kitchen griseofulvin (GRIFULVIN V) 500 MG tablet Take 500 mg by mouth daily.        Marland Kitchen guaiFENesin-dextromethorphan (ROBITUSSIN DM) 100-10 MG/5ML syrup As needed for cough       . ipratropium (ATROVENT) 0.03 % nasal spray Two sprays three times a day as needed before meals  30 mL  1  . isosorbide mononitrate (IMDUR) 60 MG 24 hr tablet Take 60 mg by mouth daily.        Marland Kitchen levalbuterol (XOPENEX HFA) 45 MCG/ACT inhaler Inhale 1-2 puffs into the lungs every 6 (six) hours as needed.  1 Inhaler  6  . levothyroxine (SYNTHROID, LEVOTHROID) 125 MCG tablet Take 125 mcg by mouth every  other day.       . levothyroxine (SYNTHROID, LEVOTHROID) 125 MCG tablet Take 62.5 mcg by mouth every other day.        . lithium 300 MG capsule Take 300 mg by mouth 2 (two) times daily with meals.        . mometasone (NASONEX) 50 MCG/ACT nasal spray 2 sprays by Nasal route 2 (two) times daily.        . Multiple Vitamin (MULTIVITAMIN) tablet Take 1 tablet by mouth daily.        . nitroGLYCERIN (NITROSTAT) 0.3 MG SL tablet Place 0.3 mg under the tongue every 5 (five) minutes as needed.        Marland Kitchen olmesartan (BENICAR) 20 MG tablet Take 1/2 tablet by mouth once daily       . potassium chloride (KLOR-CON) 10 MEQ CR tablet Take 10 mEq by mouth daily.        . sitaGLIPtan (JANUVIA) 100 MG tablet Take 100  mg by mouth daily.        . Tamsulosin HCl (FLOMAX) 0.4 MG CAPS Take 0.4 mg by mouth daily.        Marland Kitchen tiotropium (SPIRIVA) 18 MCG inhalation capsule Place 1 capsule (18 mcg total) into inhaler and inhale daily.  30 capsule  5  . warfarin (COUMADIN) 5 MG tablet Take 1 tablet (5mg ) by mouth on Friday.  Take 1/2 tablet (2.5mg ) all other days.      . pseudoephedrine-codeine-guaifenesin (GUAIFENESIN DAC) 30-10-100 MG/5ML solution        . DISCONTD: isosorbide mononitrate (IMDUR) 30 MG 24 hr tablet Take 60 mg by mouth daily.         Allergies  Allergen Reactions  . Atorvastatin     REACTION: muscle ache in legs  . Crestor (Rosuvastatin Calcium) Other (See Comments)    Lower ext fatigue and soreness  . Lisinopril Diarrhea  . Morphine     REACTION: AMS - agitation  . Prednisone     REACTION: Can't breath  . Zolpidem Tartrate     REACTION: AMS    Past Medical History  Diagnosis Date  . CAD (coronary artery disease)     post CABG with prior percutaneous intervention of teh saphenous vein graft with known saphenous vein graft disease.  Marland Kitchen AAA (abdominal aortic aneurysm) 2010    5.8cm  . Aneurysm of thoracic aorta 1995    Rupture 1995 w/spontaneous resolution  . Popliteal artery aneurysm   .  Myocardial infarction   . Ischemic cardiomyopathy     EF 40%  . Paroxysmal atrial fibrillation   . Hypertension   . Peripheral vascular disease   . Dyslipidemia   . Diabetes mellitus 2007    dx'd 2007 w/persisting FBS>125  . GERD (gastroesophageal reflux disease)   . Hiatal hernia   . Diverticular disease   . BPH (benign prostatic hypertrophy)   . COPD (chronic obstructive pulmonary disease)     05/09/08 PFT FEV1 1.84 (55%), FVC 4.42 (95%), TLC 6.46 (92%), DLCO 72%, no BD response  . Pneumonia 1/09  . Allergy   . Shingles   . OSA (obstructive sleep apnea) 06/14/09    PSG RDI 17, PLMI 96, intolerant of CPAP or BPAP    Past Surgical History  Procedure Date  . Cardiac defibrillator placement   . Colon surgery 1995    partial colectomy  . Coronary artery bypass graft 1996  . Aorta - bilateral femoral artery bypass graft 1999    [name] Early  . Nose surgery     Family History  Problem Relation Age of Onset  . Stroke Mother 64  . Cancer Father 62    Lung  . Heart attack Father 15  . Coronary artery disease      History   Social History  . Marital Status: Married    Spouse Name: N/A    Number of Children: N/A  . Years of Education: N/A   Occupational History  . Not on file.   Social History Main Topics  . Smoking status: Former Smoker -- 1.0 packs/day for 30 years    Types: Cigarettes    Quit date: 05/23/2003  . Smokeless tobacco: Former Neurosurgeon    Types: Chew    Quit date: 05/14/2003  . Alcohol Use: 0.6 oz/week    1 Cans of beer per week     only occ  . Drug Use: No  . Sexually Active: Not on file   Other Topics  Concern  . Not on file   Social History Narrative  . No narrative on file    ROS: Please see the HPI.  All other systems reviewed and negative.  PHYSICAL EXAM:  BP 116/64  Pulse 63  Resp 18  Ht 5\' 11"  (1.803 m)  Wt 172 lb 12.8 oz (78.382 kg)  BMI 24.10 kg/m2  General: Well developed, well nourished, in no acute distress. Head:   Normocephalic and atraumatic. Neck: no JVD Lungs: Decrease breath sounds bilaterally.   Heart: Normal S1 and S2.  Apical systolic murmur. Abdomen:  Normal bowel sounds; soft; non tender; no organomegaly Pulses: Pulses normal in all 4 extremities. Extremities: No clubbing or cyanosis. No edema. Neurologic: Alert and oriented x 3.  EKG:  NSR.  LAFB.  Nonspecific T abnormality.    ASSESSMENT AND PLAN:

## 2010-09-19 ENCOUNTER — Other Ambulatory Visit: Payer: Self-pay | Admitting: Cardiology

## 2010-09-19 ENCOUNTER — Encounter: Payer: Self-pay | Admitting: Family Medicine

## 2010-09-19 ENCOUNTER — Other Ambulatory Visit: Payer: Self-pay | Admitting: *Deleted

## 2010-09-22 ENCOUNTER — Emergency Department (HOSPITAL_COMMUNITY)
Admission: EM | Admit: 2010-09-22 | Discharge: 2010-09-22 | Disposition: A | Discharge status: Home / Self Care | Payer: Medicare Other | Attending: Emergency Medicine | Admitting: Emergency Medicine

## 2010-09-22 DIAGNOSIS — E785 Hyperlipidemia, unspecified: Secondary | ICD-10-CM | POA: Insufficient documentation

## 2010-09-22 DIAGNOSIS — F319 Bipolar disorder, unspecified: Secondary | ICD-10-CM | POA: Insufficient documentation

## 2010-09-22 DIAGNOSIS — R339 Retention of urine, unspecified: Secondary | ICD-10-CM | POA: Insufficient documentation

## 2010-09-22 DIAGNOSIS — I10 Essential (primary) hypertension: Secondary | ICD-10-CM | POA: Insufficient documentation

## 2010-09-22 DIAGNOSIS — E119 Type 2 diabetes mellitus without complications: Secondary | ICD-10-CM | POA: Insufficient documentation

## 2010-09-22 DIAGNOSIS — R3915 Urgency of urination: Secondary | ICD-10-CM | POA: Insufficient documentation

## 2010-09-22 DIAGNOSIS — J4489 Other specified chronic obstructive pulmonary disease: Secondary | ICD-10-CM | POA: Insufficient documentation

## 2010-09-22 DIAGNOSIS — R350 Frequency of micturition: Secondary | ICD-10-CM | POA: Insufficient documentation

## 2010-09-22 DIAGNOSIS — I4891 Unspecified atrial fibrillation: Secondary | ICD-10-CM | POA: Insufficient documentation

## 2010-09-22 DIAGNOSIS — J449 Chronic obstructive pulmonary disease, unspecified: Secondary | ICD-10-CM | POA: Insufficient documentation

## 2010-09-22 DIAGNOSIS — I2510 Atherosclerotic heart disease of native coronary artery without angina pectoris: Secondary | ICD-10-CM | POA: Insufficient documentation

## 2010-09-22 LAB — URINALYSIS, ROUTINE W REFLEX MICROSCOPIC
Glucose, UA: NEGATIVE mg/dL
Leukocytes, UA: NEGATIVE
Specific Gravity, Urine: 1.009 (ref 1.005–1.030)
Urobilinogen, UA: 0.2 mg/dL (ref 0.0–1.0)

## 2010-09-22 LAB — URINE MICROSCOPIC-ADD ON

## 2010-09-25 ENCOUNTER — Emergency Department (HOSPITAL_BASED_OUTPATIENT_CLINIC_OR_DEPARTMENT_OTHER)
Admission: EM | Admit: 2010-09-25 | Discharge: 2010-09-25 | Disposition: A | Discharge status: Home / Self Care | Payer: Medicare Other | Attending: Emergency Medicine | Admitting: Emergency Medicine

## 2010-09-25 DIAGNOSIS — J4489 Other specified chronic obstructive pulmonary disease: Secondary | ICD-10-CM | POA: Insufficient documentation

## 2010-09-25 DIAGNOSIS — J449 Chronic obstructive pulmonary disease, unspecified: Secondary | ICD-10-CM | POA: Insufficient documentation

## 2010-09-25 DIAGNOSIS — R339 Retention of urine, unspecified: Secondary | ICD-10-CM | POA: Insufficient documentation

## 2010-09-25 DIAGNOSIS — E119 Type 2 diabetes mellitus without complications: Secondary | ICD-10-CM | POA: Insufficient documentation

## 2010-09-25 DIAGNOSIS — I4891 Unspecified atrial fibrillation: Secondary | ICD-10-CM | POA: Insufficient documentation

## 2010-09-25 DIAGNOSIS — Z79899 Other long term (current) drug therapy: Secondary | ICD-10-CM | POA: Insufficient documentation

## 2010-09-25 DIAGNOSIS — I10 Essential (primary) hypertension: Secondary | ICD-10-CM | POA: Insufficient documentation

## 2010-09-25 DIAGNOSIS — Z951 Presence of aortocoronary bypass graft: Secondary | ICD-10-CM | POA: Insufficient documentation

## 2010-09-25 DIAGNOSIS — F319 Bipolar disorder, unspecified: Secondary | ICD-10-CM | POA: Insufficient documentation

## 2010-09-25 DIAGNOSIS — I2510 Atherosclerotic heart disease of native coronary artery without angina pectoris: Secondary | ICD-10-CM | POA: Insufficient documentation

## 2010-09-25 LAB — URINALYSIS, ROUTINE W REFLEX MICROSCOPIC
Bilirubin Urine: NEGATIVE
Glucose, UA: NEGATIVE mg/dL
Specific Gravity, Urine: 1.01 (ref 1.005–1.030)
pH: 6.5 (ref 5.0–8.0)

## 2010-09-25 LAB — URINE MICROSCOPIC-ADD ON: Urine-Other: 1

## 2010-09-25 NOTE — Assessment & Plan Note (Signed)
[facility]                            CARDIOLOGY OFFICE NOTE   NAME:STEPHENSHandy, Mcloud                      MRN:          [id number]  DATE:01/06/2008                            DOB:          1935/09/20    [place] is in for followup.  In general, he has been stable.  He has  occasional indigestion, but nothing like he has had in the past with his  pressure.  He is overall getting along reasonably well.   CURRENT MEDICATIONS:  1. Potassium chloride 10 mEq daily.  2. Crestor 5 mg daily.  3. Warfarin as directed.  4. Enalapril 2.5 daily.  5. Spiriva two 18 mcg tablets daily.  6. Symbicort 2 puffs b.i.d.  7. Ipratropium 2 sprays daily.  8. Carvedilol 12.5 mg one half tablet b.i.d.  9. Zyrtec 1 daily.  10.Nexium 40 mg daily.  11.Lithium carbonate 300 mg b.i.d.  12.Enteric-coated aspirin 81 mg daily.  13.Actos 45 mg daily.  14.Januvia 100 mg daily.  15.Flovent 2 puffs b.i.d.  16.Furosemide 40 mg daily.  17.Xopenex 2 puffs b.i.d.   On physical, the blood pressure is 120/80.  The pulse is 60.  The lung  fields actually are relatively clear without significant prolongation of  the expiratory phase today.  His cardiac rhythm is regular with a rate  of 60.  His exam was otherwise unremarkable today.   The electrocardiogram demonstrates normal sinus rhythm.  There is delay  in R-wave progression with T-wave inversion in 1, aVL, V5, and V6.  Recent workup by Dr. Dagoberto Ligas reveals as chronic COPD.   Overall, the patient remained stable at the present time. Continued  medical therapy as warranted.  We will get a basic metabolic profile to  make sure that his laboratory status remains unchecked.  He will return  to see [place] in followup in 3-4 months.     [name]. Riley Kill, MD, [facility]  Electronically Signed    TDS/MedQ  DD: 01/06/2008  DT: 01/07/2008  Job #: [id number]

## 2010-09-25 NOTE — Assessment & Plan Note (Signed)
OFFICE VISIT   Roberto [name], Roberto [name]  DOB:  [birth year]                                       04/03/2010  UXLKG#:[id number]   The patient presents today for continued discussion of his  thoracoabdominal aneurysm.  He has been known to me for many years with  follow-up of this.  He underwent open infrarenal abdominal aortic  aneurysm repair by myself in 1999.  He prior had coronary artery bypass  grafting in 1996.  He did have a history of a type 2 thoracic dissection  in 1995.  He has had known prolonged ectasia of his thoracic aneurysm  and this has been followed with serial ultrasounds.  Most recently he  underwent repeat CT scan to follow-up a pulmonary nodule in October 2011  and this revealed increasing size of his aneurysm at his diaphragm.  Maximal diameter is now 6.0, up from 5.7 in April 2011.  He continues to  be in quite frail health.  He did have another myocardial infarction in  June 2011.  Cath at that time revealed occlusion of several of his prior  bypass grafts,  chronic systolic congestive heart failure, ischemic  cardiomyopathy with a an ejection fraction of 20% to 25% by echo in June  2011.  He has chronic atrial flutter and does have a defibrillator  placed.  He does report generalized diminished stamina since his  myocardial infarction in June and has more shortness of breath.  He does  have to sleep on a pillow.  He is on chronic Coumadin therapy.   PAST HISTORY:  Otherwise unchanged.   PHYSICAL EXAMINATION:  A well-developed white male appearing stated age  of 71.  He is in no respiratory distress currently.  His blood pressure  is 100/61, heart rate 62, respirations 18.  He is in no acute distress.  HEENT:  Normal.  Chest:  Clear bilaterally.  Abdomen:  Well-healed  midline incision.  I do not feel an aneurysm.  He does have palpable  femoral pulses.  Musculoskeletal:  Shows no major point or cyanosis.  He  has no peripheral edema.   Neurologic:  Grossly intact.  Skin:  Without  ulcers or rashes.   I reviewed his scan and his prior scans as well.  This does show slow  continued growth in size.  His aneurysm begins at the level of his arch  where it is approximately 4 cm and it has multiple lobulations but the  maximal diameter at the diaphragm is 6.0.  This does extend down just to  the takeoff of the celiac axis.  This caliber is near normal at the  level of the superior mesenteric artery and renal arteries.   I had a long discussion with the patient and his wife.  I  explained  that with the 6 cm aneurysm with slow continued growth,  his predicted  risk of rupture would be approximately 10% per year.  I feel that his  risk for surgery far exceeds with his chronic failure.  I feel that he  is in no way a candidate for open surgical repair and would be very  borderline repair of stent graft due to the takeoff at the level of the  celiac axis.  I did discuss this by telephone with Dr. Bonnee Quin as  well.  We will plan  to see him again in 6 months with CT scan to follow  up progression of his enlargement.  He understands the symptoms of  leaking aneurysm and knows to report immediately to the emergency room  should this occur.     Larina Earthly, M.D.  Electronically Signed   TFE/MEDQ  D:  04/03/2010  T:  04/04/2010  Job:  [id number]   cc:   [name]. Riley Kill, MD, [facility]  Alfonse Alpers. Dagoberto Ligas, M.D.

## 2010-09-25 NOTE — Consult Note (Signed)
Roberto [name], Roberto [name]               ACCOUNT NO.:  [id number]   MEDICAL RECORD NO.:  [id number]          PATIENT TYPE:  INP   LOCATION:  2011                         FACILITY:  [facility]   PHYSICIAN:  Coralyn Helling, MD        DATE OF BIRTH:  1935/11/26   DATE OF CONSULTATION:  09/15/2007  DATE OF DISCHARGE:                                 CONSULTATION   REASON FOR CONSULTATION:  Cough with purulent sputum and increasing  shortness of breath.   HISTORY OF PRESENT ILLNESS:  Roberto [name] is a pleasant 75 year old  former smoker (two packs a day from age 88 to age 12) who is chronically  short of breath (can walk approximately 50 feet) over the last 1-2  years, he has extensive past medical history.  He presented to his  primary care physician, Dr. Dagoberto [name], on September 07, 2007, with five days of  increasing shortness of breath with any activity, increasing cough and  sputum that was notable for yellow and green color along with increasing  wheezing.  After being seen by his primary care physician, Dr. Dagoberto [name],  he was treated with prednisone taper with some improvement.  He  presented to the emergency department at [facility], Sep 13, 2007, with atrial fibrillation with rapid ventricular  response and was admitted and was treated with a pulmonary toilet and  p.o. Avelox. His pulmonary symptoms are better.  He has never seen a  pulmonary physician in the past.   PAST MEDICAL HISTORY:  1. He had an MI in 1995.  2. Ischemic cardiomyopathy with AID implantation.  3. Hypertension.  4. Increased lipids.  5. Tachybrady syndrome.  6. Atrial ablation in May 2006.  7. Diabetes mellitus.  8. Peripheral vascular disease with bilateral femoral popliteal      surgeries.  9. Chronic obstructive pulmonary disease for which he uses Xopenex      MDI.  10.Thoracic abdominal aortic aneurysm.  11.Suspected bipolar since he is currently on lithium carbonate.   SOCIAL HISTORY:  He is a  retired Curator.  He has smoked 1-2 packs a  day from age 64 to age 59.  He has occasional alcohol.  He is married.   FAMILY HISTORY:  Mother died in her 27s.  Father died at age 71 of lung  cancer after having MI in his 8s.  He has five siblings, several of  which have coronary artery disease.   ALLERGIES:  MORPHINE, LIPITOR and AMBIEN.   HOME MEDICATIONS:  1. Flovent 2 puffs b.i.d.  2. Aspirin 81 mg daily.  3. Crestor 5 mg p.o. daily.  4. Nexium 40 mg daily.  5. Zyrtec 10 mg daily.  6. Lithium carbonate 300 mg p.o. b.i.d.  7. Actos 45 mg p.o. daily.  8. Enalapril 2.5 mg daily.  9. Januvia 100 mg p.o. daily.  10.Coumadin 5 mg daily.  11.Lasix 40 mg daily.  12.Coreg 12.5 mg p.o. b.i.d.  13.Digoxin 0.065 mg daily.  14.Xopenex MDI 2 puffs b.i.d.  15.Potassium chloride 10 mEq p.o. daily.   PHYSICAL EXAMINATION:  VITAL  SIGNS:  Blood pressure 114/79, pulse 63,  respiratory rate 20, temperature 97.3, sats 98% on 3 liters.  GENERAL APPEARANCE:  A well-nourished, well-developed white male in no  acute distress.  HEENT:  Normal oropharynx.  No JVD is appreciated.  CHEST:  Decreased air movement, positive for expiratory wheezes and a  congested cough.  CARDIOVASCULAR:  Heart sounds are irregular.  ABDOMEN:  Soft and nontender.  Positive bowel sounds.  GU:  He voids.  EXTREMITIES:  Warm with decreased pulses.   LABORATORY DATA:  Chest x-ray shows bibasilar atelectasis, one lead  AICD, questionable infiltrate in left lower lobe, may represent further  infection.   Hemoglobin 12, hematocrit 36.4, wbc 11.1, platelets 126.  Sodium 140,  potassium 3.7, chloride 101, CO2 33, BUN 16, creatinine 1.21, glucose  128.  CK-MB 1.5, CK 39, troponin I 0.02.  Calcium 9.2.  Magnesium 1.8.  Sputum culture is in a preliminary state with gram positive cocci and  gram negative rods.  Digoxin level 0.3.  TSH 2.056.  BNP 167.   IMPRESSION AND PLAN:  1. Acute exacerbation of chronic obstructive  pulmonary disease in a      life-long smoker.  He also has purulent tracheobronchitis.      Therefore, we will continue his Avelox for a total of seven days.      He will be placed on a prednisone taper.  Will also add Spiriva and      Symbicort to his pharmaceutical regimen and continuation of his      mucolytics.  We will wean his oxygen for sats greater than 94%.  2. Coronary artery disease.  Per cardiology.  3. Diabetes.  He is followed by Dr. Dagoberto [name], who is an endocrinologist.  4. Peripheral vascular disease which is stable at this time.      Devra Dopp, MSN, ACNP      Coralyn Helling, MD  Electronically Signed    SM/MEDQ  D:  09/15/2007  T:  09/15/2007  Job:  [id number]

## 2010-09-25 NOTE — Discharge Summary (Signed)
Roberto [name], Roberto [name]               ACCOUNT NO.:  [id number]   MEDICAL RECORD NO.:  [id number]          PATIENT TYPE:  INP   LOCATION:  4728                         FACILITY:  [facility]   PHYSICIAN:  Bevelyn Buckles. Bensimhon, MDDATE OF BIRTH:  [birth year]   DATE OF ADMISSION:  05/25/2007  DATE OF DISCHARGE:  05/30/2007                               DISCHARGE SUMMARY   PRIMARY CARDIOLOGIST:  Dr. Bonnee Quin   ELECTROPHYSIOLOGIST:  [name]   PRIMARY CARE:  Dr. Corrin Parker   DISCHARGING DIAGNOSES:  1. Atrial fibrillation with rapid ventricular response, now in sinus      rhythm.  2. Anticoagulation therapy.  3. Pneumonia.  4. Acute-on-chronic congestive heart failure, stable at this time.   PAST MEDICAL HISTORY:  1. Coronary artery disease status post MI in 1995.      a.     CABG in 1996.      b.     Non-ST-elevated MI/stent to the sequential vein graft to the       obtuse marginal as well as a Tetra bare-metal stent 2001.  2. Cardiac catheterization 2006:  EF 32%, multivessel native coronary      disease with patent vein graft.  3. Ischemic cardiomyopathy/chronic systolic congestive heart failure;      March 2006, EF 30-40%.  4. Status post placement of Medtronic Maximo 7232 single-lead      automated implantable cardioverter defibrillator, 2006.  5. Hypertension.  6. Hyperlipidemia.  7. Tachybrady syndrome.  8. Atrial flutter status post radiofrequency ablation in 2006.  9. Type 2 diabetes.  10.Peripheral vascular disease status post aortobifemoral bypass in      2000.  11.History of thoracic abdominal aortic aneurysm.  12.COPD.  13.Remote history of tobacco use.  14.Diverticulosis.  15.Manic depression.   HOSPITAL COURSE:  A 75 year old Caucasian male with past medical history  as stated above, presented with cough and congestion.  The patient had  already been treated by Dr. Dagoberto Ligas with a Z-Pak without improvement in  symptoms.  In the ER, chest x-ray showed left  lower lobe pneumonia.  He  was treated with IV Zithromax, Rocephin, Lasix and Solu-Medrol.  Elevated D-dimer noted.  CT of the chest showed no evidence of pulmonary  embolism.  While in the ER the patient went into atrial fibrillation  with rapid ventricular response, was seen in consultation by Ward Givens, ANP, and Dr. Dietrich Pates.  The patient was anticoagulated with  heparin, Coumadin eventually initiated.  The patient resumed normal  sinus rhythm.  Coreg and digoxin initiated.  The patient with paroxysmal  atrial fibrillation.  A 2-D echocardiogram obtained.  EF currently 35%.  The patient noted to have some mild hematuria, most likely secondary to  trauma of Foley and anticoagulation therapy, resolved.  The patient  mildly supratherapeutic with Coumadin, dose managed by pharmacy.  Dr.  Gala Romney in to see the patient on day of discharge.  Patient stable,  maintaining sinus rhythm in the 60s to 70s, afebrile.  Patient being  discharged home to follow up with Dr. Riley Kill.  INR currently 1.6.  MEDICATIONS AT DISCHARGE:  1. Avelox 400 mg daily x5 days.  2. Warfarin 5 mg daily except 7.5 on Mondays and Fridays.  3. Furosemide 40 mg daily.  4. Digoxin 0.1 daily.  5. Carvedilol 12.5 b.i.d.   The patient has been given prescriptions for these five medicines.  He  has been instructed to stop his Plavix, triamterene,  hydrochlorothiazide, and doxazosin.   Note, Dr. Gala Romney reviewed the patient's medications, felt the risks  outweighed benefits of triple anticoagulation therapy with Plavix,  aspirin and Coumadin.  The patient to follow up with Dr. Riley Kill, his  primary cardiologist, for further evaluation/initiation of Plavix if he  deems necessary.   The patient is instructed to continue his previous medications  including:  1. Flovent two puffs b.i.d.  2. Aspirin 81.  3. Crestor 5.  4. Nexium 40.  5. Zyrtec 10.  6. Lithium 300 b.i.d.  7. Actos 45.  8. Enalapril 2.5.  9.  Januvia 100 daily.   The patient is scheduled for a BMET, BNP and INR on Tuesday, January 20  at our office.  Office will call the patient with time.  The patient is  to follow up with Dr. Riley Kill and office will call the patient with that  appointment also.  The patient to continue a heart-healthy diet.   Duration of discharge greater than 30 minutes.     Dorian Pod, ACNP      Bevelyn Buckles. Bensimhon, MD  Electronically Signed   MB/MEDQ  D:  05/30/2007  T:  05/30/2007  Job:  [id number]   cc:   Alfonse Alpers. Dagoberto Ligas, M.D.

## 2010-09-25 NOTE — H&P (Signed)
Roberto [name], Roberto [name]               ACCOUNT NO.:  [id number]   MEDICAL RECORD NO.:  [id number]         PATIENT TYPE:  INP   LOCATION:  2011                         FACILITY:  [facility]   PHYSICIAN:  Christell Faith, MD   DATE OF BIRTH:  February 05, 1936   DATE OF ADMISSION:  09/13/2007  DATE OF DISCHARGE:                              HISTORY & PHYSICAL   PRIMARY CARE PHYSICIAN:  Dr. Dagoberto Ligas.   CHIEF COMPLAINT:  Shortness of breath and cough.   HISTORY OF PRESENT ILLNESS:  This is a 75 year old white man with COPD  and coronary artery disease.  He has been having a productive cough for  about the past week.  His sputum is thick and was green but recently has  turned brown.  No frank hemoptysis.  He denies fevers, chills, or  sweats.  He is getting short of breath with activities of daily living  such as getting dressed.  He cannot climb stairs due to dyspnea.  He  denies angina.  He denies orthopnea, PND, or lower extremity edema.  He  recently took 5 days of prednisone and had mild improvement in his  shortness of breath however, his cough has worsened.  In the emergency  department, he is noted to be in atrial fibrillation at 140 beats per  minute.  He does not perceive tachy palpitations.   PAST MEDICAL HISTORY:  1. Myocardial infarction in 1995, status post 5-vessel CABG in 1996,      non-ST-MI in 2001 with PCI to a vein graft.  Last catheterization      was in April 2006, showed an occluded vein graft to right coronary      artery and the other 4/5 grafts were patent.  2. Ischemic cardiomyopathy, ejection fraction 30%-40%.  Last echo was      in January 2009.  3. Status post single-lead AICD.  4. Hypertension.  5. Hyperlipidemia.  6. Tachybrady syndrome with atrial fibrillation with RVR.  7. Status post atrial flutter ablation in April 2006 by Dr. Ladona Ridgel.  8. Diabetes mellitus type 2.  9. Peripheral vascular disease status post aortobifemoral bypass.  10.COPD.  11.Thoracoabdominal  aortic aneurysm.   SOCIAL HISTORY:  Lives in [place] with his wife.  He is a retired  Curator.  He rarely has alcohol.  He has an 80-pack-year tobacco  history but quit 4 years ago.   FAMILY HISTORY:  Mother died in her 77s.  Father died at age 70 of lung  cancer after having an MI in his 62s.  He has 5 siblings, several of  whom have coronary artery disease.   ALLERGIES:  MORPHINE and LIPITOR.   MEDICINES:  1. Flovent 2 puffs b.i.d.  2. Aspirin 81 mg p.o. daily.  3. Crestor 5 mg p.o. daily.  4. Nexium 40 mg p.o. daily.  5. Zyrtec 10 mg p.o. daily.  6. Lithium 300 mg p.o. b.i.d.  7. Actos 45 mg p.o. daily.  8. Enalapril 2.5 mg p.o. daily.  9. Januvia 100 mg p.o. daily.  10.Coumadin 5 mg daily.  11.Lasix 40 mg p.o. daily.  12.Coreg 12.5  mg p.o. b.i.d.  13.Digoxin 0.065 mg p.o. daily.  14.Xopenex 2 puffs b.i.d.  15.Potassium chloride 10 mEq p.o. daily.   PHYSICAL EXAMINATION:  VITAL SIGNS:  Temperature 99.4, pulse 105 ranging  to 145, blood pressure 132/77, respiratory rate 14, and saturation 93%  on room air.  GENERAL:  This is a slightly anxious older white man in no distress.  HEENT:  Pupils are equal, round, and reactive to light.  Sclerae are  clear.  NECK:  Supple.  Neck veins are flat.  No carotid bruits.  MOUTH:  Mucous membranes are moist.  No oral lesions.  No thrush.  He  has dentures.  CARDIAC:  Irregular rhythm, tachycardic rate, no murmurs.  LUNGS:  Reveal diminished breath sounds in all lung fields.  No rales or  wheezes.  ABDOMEN:  Soft, nontender, and nondistended.  No bruits.  EXTREMITIES:  No edema.  No clubbing or cyanosis.  No rash.  MUSCULOSKELETAL:  No acute joint swelling or inflammation.  NEUROLOGIC:  Cranial nerves grossly intact.  Strength is 5/5 in all 4  extremities.   LABORATORY DATA:  Chest x-ray shows AICD which is stable.  Streaky  bibasilar opacities consistent with atelectasis.  EKG shows atrial  fibrillation at 140 beats and a  left anterior fascicular block and an  old septal Q-wave MI.  Labs:  White blood cell 13.1, hemoglobin 12.3,  platelets 134.  BNP 167, troponin 0.03, CK 43, CK-MB 1.3.  Sodium 139,  potassium 3.8, BUN 19, and creatinine 1.0.   IMPRESSION:  A 75 year old white man with chronic obstructive pulmonary  disease exacerbation and atrial fibrillation with rapid ventricular  response.   PLAN:  1. Admit to Dr. Riley Kill to a monitored bed.  Treat COPD with Xopenex,      Flovent, oxygen, and a course of Avelox.  We will hold off on any      further steroids at this time.  2. Rule out myocardial infarction by cycling serial EKGs and cardiac      enzymes.  We will continue his cardiac medicines and his heart      failure medicines as described above.  He is currently well      compensated and is without chest pain.  3. Check digoxin level.  4. Check sputum culture.  5. Atrial fibrillation.  We will continue the diltiazem drip that was      started in the emergency room.  Continue his      Coumadin.  INR is pending.  May be able to cardiovert safely      pending INR results.  6. Diabetes mellitus type 2.  Strict blood pressure control with home      medicines, augmented by sliding scale insulin.      Christell Faith, MD  Electronically Signed     NDL/MEDQ  D:  09/13/2007  T:  09/14/2007  Job:  [id number]

## 2010-09-25 NOTE — H&P (Signed)
[place]               ACCOUNT NO.:  [id number]   MEDICAL RECORD NO.:  [id number]           PATIENT TYPE:   LOCATION:                                 FACILITY:   PHYSICIAN:  Pricilla Riffle, MD, FACCDATE OF BIRTH:  02-25-1936   DATE OF ADMISSION:  05/25/2007  DATE OF DISCHARGE:                              HISTORY & PHYSICAL   PRIMARY CARDIOLOGIST:  [name]. Riley Kill, MD, [facility]   ELECTROPHYSIOLOGIST:  Doylene Canning. Ladona Ridgel, MD   PRIMARY CARE Nikolis Berent:  Alfonse Alpers. Dagoberto Ligas, M.D.   PATIENT PROFILE:  A 75 year old Caucasian male with prior history of CAD  status post CABG as well as ischemic cardiomyopathy and chronic systolic  heart failure who presents to the ED with greater than 15-month history  of respiratory illness, now complicated by atrial fibrillation with  RVR.   PROBLEM LIST:  1. Atrial fibrillation with rapid ventricular response.  2. Coronary artery disease.      a.     Status post myocardial infarction January 1995.      b.     Status post coronary artery bypass grafting x5 in 1996 with       a left internal mammary artery to the left anterior descending ,       sequential vein graft to the obtuse marginal #1 and obtuse       marginal #2, sequential vein graft to the diagonal #1 and diagonal       #2, vein graft to the right coronary artery.      c.     November 12, 1999 non-ST-elevation myocardial infarction,       percutaneous intervention and  stenting with a sequential vein       graft to the obtuse marginal as well as placement of a 3.0 x 30 mm       Tetra bare metal stent.      d.     August 13, 2004, was his last cardiac catheterization.       Ejection fraction 32%.  Multivessel native coronary disease.       Patent sequential vein graft to the obtuse marginal #1 and #2 as       well as diagonal #1 and diagonal #2.  Patent left internal mammary       artery to the left anterior descending , occluded vein graft to       the distal right coronary artery which was  known.  3. Ischemic cardiomyopathy and chronic systolic congestive heart      failure.      a.     March 2006 2-D echocardiogram, ejection fraction 30-40%,       septal and periapical akinesis.      b.     August 28, 2004, status post placement of a Medtronic Maximo       7232 single-lead automated implantable cardioverter-defibrillator.  4. Hypertension.  5. Hyperlipidemia.  6. Tachybrady syndrome.  7. Atrial flutter status post radiofrequency catheter ablation April      2006 performed by Dr. Ladona Ridgel.  8. Type 2 diabetes mellitus.  9. Peripheral vascular disease status post aortobifemoral bypass in      approximately 2000 at [facility].  10.History of thoracoabdominal aortic aneurysm.  11.Chronic obstructive pulmonary disease.  12.Remote tobacco abuse, quitting approximately 4 years ago after an      80-pack-year history.  13.Diverticulosis status post colectomy in 1995.  14.History of shingles  15.History of tympanic membrane implant.  16.Manic depression.   HISTORY OF PRESENT ILLNESS:  A 75 year old Caucasian male with prior  history of CAD with CABG and ischemic cardiomyopathy status post ICD.  He was in his usual state of health until sometime around Thanksgiving  when he developed nasal, head, and chest congestion with cough and  malaise.  He was treated by Dr. Dagoberto Ligas with a Z-Pak without improvement  and more recently completed a day course of Avelox (finished last  Wednesday).  He felt better after Avelox, but over the past 2-3 days, he  has had worsening congestion symptoms along with cough and dyspnea.  He  denies any fevers or chills.  He came into the ED today, and chest x-ray  here shows left lower lobe pneumonia.  He has already been treated IV  Zithromax, Rocephin, Lasix, and Solu-Medrol.  His D-dimer was noted to  be elevated.  A CT of his chest was performed showing no evidence of  pulmonary embolism with bilateral lower lobe airspace disease.  While he   was in the emergency room, he  suddenly went into atrial fibrillation  with RVR with rates in the 140-150s.  He has been asymptomatic and  currently denies any chest pain or shortness breath.   ALLERGIES:  1. MORPHINE.  2. LIPITOR which causes myalgias.   HOME MEDICATIONS:  1. Crestor 5 mg nightly.  2. Triamterene/hydrochlorothiazide 37.5/25 mg daily. (The patient had      been off for a couple of days but restarted last night secondary to      increasing lower extremity edema.)  3. Nexium 40 mg daily.  4. Plavix 75 mg daily.  5. Zyrtec 10 mg daily.  6. Lithium 30 mg b.i.d.  7. Aspirin 81 mg daily.  8. Albuterol 4 times a day p.r.n.  9. Actos 45 mg  daily.  10.Flovent 2 puffs b.i.d.  11.Enalapril 2.5 mg b.i.d.  12.Januvia 100 mg daily.   FAMILY HISTORY:  Mother died in her 61s.  She was diabetic, but he is  not sure what she died of. Father died at 80 of lung cancer.  He had MI  at age 80.  He had five brothers and sisters.  There is a history of CAD  as well as lung and liver cancer in his siblings.   SOCIAL HISTORY:  Lives in [place] with his wife.  He is a retired  Curator.  He has one grown daughter.  He has about an 80-pack-year  history of tobacco abuse, quitting about 4 years ago.  He occasionally  will have a beer, but this is rare.  Denies any drug use and does not  routinely exercise.   REVIEW OF SYSTEMS:  Positive for general malaise, bilateral hearing  loss, pleuritic chest discomfort and soreness that is worse with cough  and deep breathing.  Positive for dyspnea on exertion and shortness of  breath over the past 2 or 3 days.  Positive for cough for greater than 1  month.  He occasionally has wheezing and has chronic lower extremity  edema.  He is also diabetic.  All other  systems reviewed and negative.   PHYSICAL EXAMINATION:  VITAL SIGNS:  Temperature 99.5, heart rate 145,  respirations 30, blood pressure 165/86.  GENERAL:  Pleasant white male in no  acute distress.  Awake, alert and  oriented x3.  NECK:  JVP approximately 9-10 cm.  HEENT:  Normal.  NEUROLOGIC:  Grossly intact and nonfocal.  LUNGS:  Respirations regular and unlabored.  He has got crackles at the  left base and diminished breath sounds bilaterally.  CARDIAC:  Irregular regular,  S1-S2.  He is tachycardic.  No S3, S4, or  murmurs.  ABDOMEN:  Round, soft with mild right upper quadrant tenderness.  No  hepatomegaly.  Bowel sounds present x4.  EXTREMITIES:  Warm, dry, pink with trace bilateral lower extremity  edema.  Dorsalis pedis pulses 1+ and equal bilaterally.   Chest x-ray shows COPD with subsegmental atelectasis or left lower lobe  pneumonia.  There is also a small effusion there.   CT of his chest shows saccular thoracoabdominal aneurysm.  No pulmonary  emboli.  Bilateral lower lobe airspace disease.   EKG shows atrial fibrillation with a rate 145, poor R-wave progression,  T-wave inversion in aVL which is old.  There is a left axis deviation.   Lab work:  Hemoglobin 11.9, hematocrit 35.0. Sodium 140, potassium 4.2,  chloride 107,  CO2 27.5, BUN 11, creatinine 1.2, glucose 127. D-dimer  1.74. BNP 581.0.   ASSESSMENT AND PLAN:  1. Atrial fibrillation with rapid ventricular response in the setting      of prolonged respiratory illness and now pneumonia.  He is      asymptomatic despite elevated heart rates of 140-150s.  Currently      on diltiazem drip 10 mg an hour with minimal change in heart rate.      Will add digoxin.  Will load him intravenously today and initiate      p.o. tomorrow.  Hopefully he will convert today or tonight;      however, if he does not, we would have to consider TEE and      cardioversion. Given ischemic cardiomyopathy, he is not considered      a good candidate for long-term calcium channel blocker therapy,      and, therefore, we would prefer to try beta blockers and digoxin in      the long run.  I suspect he may go in and out  of atrial      fibrillation at home.  Will add heparin and Coumadin.  Check      magnesium and TFTs.  His electrolytes appear to be within normal      limits.  2. Coronary artery disease.  He is not in any anginal discomfort,      although he has had some soreness and pleuritic chest discomfort      with cough.  Suspect that is secondary to pneumonia.  Check cardiac      markers and continue aspirin, statin  and Plavix.  3. Left lower lobe pneumonia, prolonged respiratory illness, worse in      the past 2-3 days.  Plan IV Rocephin and Zithromax as was initiated      here in the ED.  Followup chest x-rays throughout the course of      this admission.  4. Ischemic cardiomyopathy and chronic systolic congestive heart      failure.  He has trace lower extremity edema on exam, and his BNP      is elevated  at 581.  He was given IV Lasix.  Will plan to give IV      Lasix 40 mg b.i.d. at least for today and can switch him  to p.o.      tomorrow.  He was on triamterene/hydrochlorothiazide at home which      he had been off of for a couple of days.  Continue ACE inhibitor      therapy with eventual plan to add beta blocker.  He is not      currently wheezing.  5. Hypertension, stable.  Follow.  6. Diabetes mellitus.  Continue home medications.  Check CBGs.  7. Anemia.  Will guaiac his stool.  He did say that he had bright red      blood per rectum approximately 2-3 weeks ago after using a      suppository, but otherwise he denies any melena or bright red blood      per rectum.  Will check his iron studies.  8. History of aneurysm.  Will wait for the final read on the CT that      was performed here and compare this to old CTs.  9. Chronic obstructive pulmonary disease.  The patient is not      currently wheezing.  Continue inhalers.      [name], ANP      Pricilla Riffle, MD, [facility]  Electronically Signed    CB/MEDQ  D:  05/25/2007  T:  05/25/2007  Job:  [id number]   cc:   [name]. Riley Kill, MD, [facility]  Doylene Canning. Ladona Ridgel, MD  Alfonse Alpers Dagoberto Ligas, M.D.

## 2010-09-25 NOTE — Assessment & Plan Note (Signed)
[facility]                         ELECTROPHYSIOLOGY OFFICE NOTE   NAME:STEPHENSCuthbert, Turton                      MRN:          [id number]  DATE:11/03/2007                            DOB:          1936-04-18    HISTORY OF PRESENT ILLNESS:  Mr. Tolbert returns to today for followup.  He is a very pleasant 75 year old man with a history of coronary artery  disease status post myocardial infarction.  He has severe LV dysfunction  with an EF of 30-35 %.  He returns today for followup.  He also has  severe COPD and has been tolerant to beta blockers in the past.  He  denies chest pain or shortness of breath today.   CURRENT MEDICATIONS:  His current medications include,  1. Zyrtec 10 mg a day.  2. Nexium 40 a day.  3. Aspirin 81 a day.  4. Actos 45 a day.  5. Januvia 100 a day.  6. Flovent 2 puffs twice daily.  7. Furosemide 40 a day.  8. Carvedilol 12.5 twice daily.  9. Potassium 10 a day.  10.Lanoxin 0.625 daily.  11.Crestor 5 a day.  12.Warfarin as directed.   He returns today for followup.  He denies chest pain.  He denies  shortness of breath.  He is continuing to be quite active playing golf  and fishing and raising a garden.   PHYSICAL EXAMINATION:  GENERAL: He is a pleasant well appearing man, in  no acute distress.  Blood pressure is 134/81, pulse 80 and regular,  respirations were 18.  Weight was 170 pounds.  NECK:  Revealed no jugular venous distention.  LUNGS:  Clear to auscultation.  Wheeze, rales, and rhonchi present.  CARDIOVASCULAR: Regular rate and rhythm.  Normal S1 and S2.  There were  no murmurs, rubs, or gallops.  ABDOMEN:  Soft and nontender.  EXTREMITIES:  Demonstrate no edema.   Interrogation of his defibrillator demonstrate Medtronic Maximo, R waves  are 8, the impedance were 40, the thresholds 0.5 at 0.4. Battery voltage  was 3.10.   IMPRESSION:  1. Ischemic cardiomyopathy.  2. Congestive heart failure.  3. Chronic  obstructive pulmonary disease.  4. Status post implantable cardioverter-defibrillator insertion.   DISCUSSION:  Mr. Reuter is stable.  His defibrillator is working  normally.  His dyspnea is much improved.  We will plan to see him back  for the ICD.  Followup in 1 year.     Doylene Canning. Ladona Ridgel, MD  Electronically Signed    GWT/MedQ  DD: 11/03/2007  DT: 11/04/2007  Job #: [id number]

## 2010-09-25 NOTE — Discharge Summary (Signed)
NAME:  Roberto [name], Roberto [name]               ACCOUNT NO.:  [id number]   MEDICAL RECORD NO.:  [id number]          PATIENT TYPE:  INP   LOCATION:  2011                         FACILITY:  [facility]   PHYSICIAN:  [name]. Riley Kill, MD, FACCDATE OF BIRTH:  25-Aug-1935   DATE OF ADMISSION:  09/13/2007  DATE OF DISCHARGE:  09/16/2007                               DISCHARGE SUMMARY   PRIMARY CARDIOLOGIST:  Maisie Fus D. Riley Kill, MD, [facility]   CONSULTING PHYSICIAN:  Coralyn Helling, MD   PROCEDURES PERFORMED DURING HOSPITALIZATION:  None.   FINAL DISCHARGE DIAGNOSIS:  1. Chronic obstructive pulmonary disease exacerbation  2. Bronchitis.  3. Ischemic cardiomyopathy with an ejection fraction of 30-40% per      echo in January 2009.  4. Coronary artery disease.      a.     Status post myocardial infarction in 1995.      b.     Status post 5-vessel coronary artery bypass grafting in       1996.      c.     Non-ST-elevated myocardial infarction in 2001, with       percutaneous coronary intervention to the vein graft.      d.     Last cardiac catheterization in April 2006, showing occluded       vein graft to the right coronary artery and other 4/5 grafts were       patent.  5. Status post single lead automatic implantable cardioverter-      defibrillator.  6. Hypertension.  7. Hyperlipidemia.  8. Tachy-brady syndrome with atrial fibrillation with atrial      fibrillation and rapid ventricular response.      a.     Status post atrial flutter ablation in April 2006, on       chronic Coumadin therapy.  9. Diabetes mellitus type 2.  10.Peripheral vascular disease, status post aortofemoral bypass      grafting.  11.Thorough abdominal aortic aneurysm.   HISTORY OF PRESENT ILLNESS:  This is a 75 year old Caucasian male with  history of COPD and coronary artery disease, who has been having  productive cough and shortness of breath for 1 week prior to being  admitted.  He had recently been taking 5 days of prednisone  and had mild  improvement in his shortness of breath, however, his cough worsened and  he reported the emergency room.  He was noted to be in atrial  fibrillation with RVR at 140 beats per minute, although he was aware of  this.   The patient was seen and examined by Dr. Oneita Hurt, fellow for Dr.  [name] and admitted, so the patient was started on Avelox and  oxygen and also started on Flovent and Xopenex.  He was also admitted to  rule out myocardial infarction secondary to the exacerbation of  shortness of breath.   The patient was seen and examined by Dr. Riley Kill the following morning  and Dr. [name] was consulted pulmonary secondary to the patient's  symptoms.  Cardiac enzymes were found to be negative x2.  The white  blood cells  were elevated to 13.1.  The patient's prednisone was weaned  over his hospitalization.  The patient's atrial fibrillation was  controlled with hydration concerning his rate.  Once the patient was  better hydrated, his heart rate stabilized, and his medications were  continued as he was as an outpatient.  The patient was started on Avelox  400 mg one p.o. daily and guaifenesin p.o. daily to assist in pulmonary  toilet and treatment of bronchitis.  The patient was followed closely by  Dr. [name] also during hospitalization and breathing status improved.   On the day of discharge, the patient was seen and examined by Dr.  Riley Kill.  It was found that his INR was 3.0 and his Coumadin was held.  He is to follow up in the Coumadin Clinic in approximately 4 days and  will be on a low dose of Coumadin at 2.5 mg daily until seen by them.  The patient is also on Avelox, which may be affecting the INR.  Pharmacy  has been made aware and has been following throughout his  hospitalization.   The patient was found to be stable on discharge.  Blood pressure was  132/74, heart rate 60, and respirations 24 with a temperature of 96.5.   LABORATORY DATA:  Lab  work was stable.  Digoxin was 0.3, TSH 2.056.  BNP  167.  Troponins negative x3 at 0.02, 0.02, and 0.02.  Calcium was 9.2,  magnesium 1.8, sodium 146, potassium 3.7, chloride 101, CO2 of 33, BUN  60, creatinine 1.21, glucose 128.  Hemoglobin 12.0, hematocrit 36.4,  white blood cells 11.1, and platelets 126.   DISCHARGE MEDICATIONS:  1. Mucinex 600 mg 2 tablets twice a day.  2. Avelox 40 mg at bedtime x3 days.  3. Spiriva 18 mcg inhaler daily.  4. Symbicort 2 puffs twice a day.  5. Aspirin 325 daily.  6. Coreg 12.5 mg twice a day.  7. Enalapril 2.5 mg daily.  8. Crestor 5 mg at bedtime.  9. Colace 200 mg daily.  10.Claritin 10 mg daily.  11.Actos 45 mg daily.  12.Protonix 80 mg daily (or Nexium as at home).  13.Lithium carbonate 300 mg twice a day.  14.Januvia 100 mg daily.  15.Lasix 40 mg daily.  16.Digoxin 0.062 daily.  17.Coumadin 2.5 mg daily at bedtime (to be adjusted per PT/INR in the      Coumadin Clinic).  18.Nitroglycerin 0.4 mg under tongue as needed for chest pain.  19.Flovent HFA 110 mcg 2 puffs twice a day.  20.Xopenex HFA 45 mcg 2 puffs twice a day as needed.  21.Klor-Con 10 mEq daily.   ALLERGIES:  MORPHINE AND LIPITOR.   FOLLOWUP PLANS AND APPOINTMENT:  1. The patient is to follow up with Dr. [name] on Sep 30, 2007, at 10:45 a.m.  2. The patient is to follow up with Dr. [name].  He is to make that      appointment on his own in 3-4 weeks.  3. The patient is to follow up in the Coumadin Clinic at 3:15 on Sep 21, 2007, for continued management of Coumadin dosing.   TIME SPENT WITH THE PATIENT TO INCLUDE PHYSICIAN TIME:  45 minutes.      Bettey Mare. Lyman Bishop, NP      [name]. Riley Kill, MD, [facility]  Electronically Signed    KML/MEDQ  D:  09/16/2007  T:  09/17/2007  Job:  [id number]   cc:  Coralyn Helling, MD

## 2010-09-25 NOTE — Assessment & Plan Note (Signed)
[facility]                                 ON-CALL NOTE   HAROLD, MONCUS                        MRN:          [id number]  DATE:08/22/2007                            DOB:          November 22, 1935    Governor Specking, medical record number [id number], DOB 10-24-1935.   PRIMARY CARDIOLOGIST:  Dr. Bonnee Quin   ATTENDING CARDIOLOGIST:  Dr. Simona Huh.   I received a phone call from Mr. Britt Boozer wife concerning the patient  having some blood buildup in the corner of his right eye.  The patient  is on Coumadin secondary to atrial fibrillation.  The patient is to have  a followup with the Coumadin Clinic early the week of April 13 but  secondary to the blood buildup in his eye, I have asked the patient to  stop taking his Coumadin until Monday.  The patient did already take 5  mg today, and he will not take it tomorrow or Monday.  The patient will  call the Coumadin Clinic Monday to make an appointment to have follow-up  lab drawn to evaluate need for a Coumadin adjustment.  The patient has  been advised if he begins to cough up blood, pass blood in his stool,  have severe bleeding from his nose or mouth, he is to come to the  emergency room immediately.  He verbalized understanding.     Bettey Mare. Lyman Bishop, NP  Electronically Signed    KML/MedQ  DD: 08/22/2007  DT: 08/22/2007  Job #: [id number]

## 2010-09-25 NOTE — Letter (Signed)
June 29, 2007    Alfonse Alpers. Dagoberto Ligas, M.D.  [address]  [place]  [place] [address]   RE:  DUELL, HOLDREN  MRN:  [id number]  /  DOB:  [birth year]   Dear Roberto [name],   I had the pleasure of seeing Mr. Rosenow in the office today in  followup.  As you know, he recently had to stop his Coumadin.  This was  held for several days for a tooth, and he is back on this.  My  understanding is that he is following up in your office, and having his  Coumadin checked.  Overall, he has had several courses of antibiotics.  I know he has had a chest CT that showed patchy left lower lobe air  space disease.  There was also some minimal patchy right lower lobe air  space disease.  Over the last few days he has gotten actually quite a  bit better.  There were small bilateral pleural effusions, but  fortunately the patient is on diuretics.  He also had some recent  laboratory studies done here.  Importantly, those laboratory studies  revealed a hemoglobin of 11.8, hematocrit of 35.4, white count of  43,000.  The platelet count was depressed at 122,000.  The INR was 2.8  and the potassium 3.5.  I have suggested to him that he be on potassium,  K-Dur 10 mEq daily.   He denies any ongoing chest pain at the present time.  He has started to  feel better, quite a bit, over the past few days.  In the interim, he  has also had Doppler studies.  This revealed a patent aorta, right  iliac, and left common femoral artery bypass graft.  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  There is  also ectatic dilatation of the popliteal arteries.  He has had recent  follow up with [name] Early who has recommended a conservative course.   Today, on examination he is alert and oriented in no distress.  Blood  pressure was 122/60.  The pulse of 52 and regular.  The lung fields  revealed some decrease in  breath sounds with prolonged expiration  compatible with underlying chronic lung disease.   The EKG reveals sinus rhythm/sinus bradycardia with left axis deviation  and nonspecific T-wave abnormality.   I have made the decision to cut his Lanoxin to 0.0625 mg daily because  of his bradycardia.  We will see him back in followup in 2 months.  We  did go ahead and repeat his CBC because of his platelet count, and he is  scheduled to see you later this week.  My hope is that he will continue  to improve.  He may need a follow up CT scan of the chest and will  continue to follow this.  I appreciate the opportunity of sharing in his  care.    Sincerely,      [name]. Riley Kill, MD, [facility]  Electronically Signed    TDS/MedQ  DD: 06/29/2007  DT: 06/30/2007  Job #: [id number]

## 2010-09-25 NOTE — Assessment & Plan Note (Signed)
Roberto [name]                            CARDIOLOGY OFFICE NOTE   NAME:STEPHENSJewett, Mcgann                      MRN:          [id number]  DATE:06/02/2008                            DOB:          [birth year]    Roberto [name] is in for followup.  In general, he has been relatively  stable.  He does have a rising PSA and he is going to see [name]  tomorrow about the possibility of a prostate biopsy.  He has had rare  episodes of indigestion and nothing that sounds like significant angina.  He did wrench his back playing golf.   CURRENT MEDICATIONS:  1. Enalapril 2.5 b.i.d.  2. Zyrtec 10 mg daily.  3. Crestor 5 mg daily.  4. KCl 10 mEq daily.  5. Warfarin as directed.  6. Spiriva 280 mcg daily.  7. Symbicort 2 puffs b.i.d.  8. Carvedilol 12.5 mg one-half tablet b.i.d.  9. Ipratropium 2 sprays daily.  10.Nexium 40 mg daily.  11.Lithium carbonate ER 300 mg b.i.d.  12.Furosemide 40 mg daily.  13.Aspirin 81 mg daily.  14.Januvia 100 mg daily.  15.Actos 45 mg daily.   PHYSICAL EXAMINATION:  GENERAL:  He is alert and oriented.  VITAL SIGNS:  Blood pressure is 116/60, pulse is 68.  There is  prominence of his sacrum.  LUNGS:  The lung fields actually are relatively clear.  CARDIAC:  Unchanged.  It is in regular rhythm.   IMPRESSION:  1. Coronary artery disease status post coronary artery bypass graft      surgery with percutaneous intervention of saphenous vein grafts.  2. Hypercholesterolemia with a history of statin intolerance.  3. Thoracoabdominal aneurysm with popliteal aneurysms with followup      scheduled with Dr. Arbie Cookey.  4. History of sub modest left ventricular dysfunction.  5. History of tobacco use.  6. Elevated PSA.  7. Adult-onset diabetes mellitus.   PLAN:  1. Continue current medical regimen.  2. Prostate biopsy.  He would not be at extraordinarily high risk to      stop his Coumadin.  He      does know that there is some slight  risk involved.  Plan, we will      see him back in followup in 3 months' time.  He will see Dr.      Vonita Moss in the next day or 2.     [name]. Riley Kill, MD, [facility]  Electronically Signed    TDS/MedQ  DD: 06/02/2008  DT: 06/03/2008  Job #: [id number]

## 2010-09-25 NOTE — Assessment & Plan Note (Signed)
[facility]                            CARDIOLOGY OFFICE NOTE   NAME:STEPHENSCan, Lucci                      MRN:          [id number]  DATE:09/30/2007                            DOB:          July 30, 1935    The patient seen in the [facility] on Sep 30, 2007 for Dr.  Jens Som.   PHYSICIANS:  Primary cardiologist is Dr. Bonnee Quin.  Pulmonologist Dr.  [name].   This is a 75 year old white male patient of Dr. Rosalyn Charters who was  admitted to the hospital with a COPD exacerbation and bronchitis.  He  has a history of tachybrady syndrome, and went into atrial fibrillation  with a rapid ventricular response.  He was placed on Coumadin, and his  heart rate was regulated.  The patient is here today post  hospitalization, and is doing well.  He says he is never symptomatic  when he is in atrial fibrillation and even when his heart is racing.  He  has done well since he has been home.  He denies shortness of breath and  chest pain, dizziness or presyncope.  He has had trouble getting his  Coumadin regulated, and he does not like taking 40 mg of Lasix daily  which was started back in February when he was in with heart failure and  atrial fibrillation.  He does admit to using salt.   CURRENT MEDICATIONS:  1. Kay Ciel 10 mEq daily.  2. Lanoxin 0.0625 mg daily.  3. Crestor 5 mg daily.  4. Warfarin as directed.  5. Enalapril 2.5 mg daily.  6. Spiriva 2 puffs daily.  7. Symbicort 2 puffs b.i.d.  8. Zyrtec 10 mg daily.  9. Nexium 40 mg daily.  10.Lithium carbonate 300 mg b.i.d.  11.Aspirin 81 mg daily.  12.Actos 45 mg daily.  13.Januvia 100 mg daily.  14.Flovent 2 puffs b.i.d.  15.Furosemide 40 mg daily.  16.Carvedilol 12.5 mg b.i.d.  17.Xopenex 2 puffs b.i.d.   PHYSICAL EXAMINATION:  GENERAL:  A pleasant 75 year old white male in no  acute distress.  VITAL SIGNS:  Blood pressure 128/72, pulse 52, weight 167.  NECK:  Without JVD, HJR, bruit or thyroid  enlargement.  LUNGS:  Clear anterior and posterolateral.  HEART:  Regular rate and rhythm at 50 beats per minute with occasional  skipping, normal S1, S2, no murmur, rub, bruit, thrill or heave noted.  ABDOMEN:  Soft without organomegaly, mass lesions or abnormal  tenderness.  EXTREMITIES:  Without cyanosis, clubbing or edema.  Has good distal  pulses.   EKG:  Sinus bradycardia with PVC's, poor R wave progression, nonspecific  ST-T wave changes.   IMPRESSION:  1. Chronic obstructive pulmonary disease exacerbation and bronchitis      resolved.  2. Tachybrady syndrome with atrial fibrillation and rapid ventricular      response currently maintaining normal sinus rhythm.  3. Ischemic cardiomyopathy, ejection fraction 30-40% on echocardiogram      in 2009.  4. Coronary artery disease status post myocardial infarction in 1995      followed by CABG x5 in 1996.  5. Non-ST elevation  myocardial infarction in 2001 with PCI      intervention to the vein graft.  Last catheterization in 2006      showing occluded vein graft to the RCA, 4 or 5 grafts were patent.  6. Status post single lead automatic implantable cardioverter      defibrillator.  7. Hypertension.  8. Hyperlipidemia.  9. Diabetes mellitus.  10.Peripheral vascular disease status post aortobifemoral bypass      grafting.  11.Thoracoabdominal aortic aneurysm.   PLAN:  The patient is currently stable from a cardiac standpoint.  I  have told him to get rid of the salt shaker, and limit his sodium intake  to 2000 mg a day.  I told him he could cut his Lasix back to 20 mg a day  and see how he does.  If he gains 2-3 pounds overnight he needs to take  the 40 mg of Lasix.  He already has an appointment to see Dr. Ladona Ridgel  back as well as Dr. Riley Kill in the next one or two months.       Jacolyn Reedy, PA-C  Electronically Signed      Madolyn Frieze. Jens Som, MD, [facility]  Electronically Signed   ML/MedQ  DD: 09/30/2007  DT:  09/30/2007  Job #: [id number]   cc:   [name]. Riley Kill, MD, [facility]

## 2010-09-25 NOTE — Assessment & Plan Note (Signed)
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  He continues to do  well overall.  He is having some increased difficulty with shortness of  breath with exertion, has had a recent episode of rapid atrial  fibrillation.  He does have known reduced ejection fraction in the 30%  range with ischemic cardiomyopathy.  He is status post aortobifemoral  bypass grafting in 2000.  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  His popliteal artery  aneurysms are unchanged from his prior studies.  Maximal diameter on the  right of 1.4 and on the left 1.1 cm.   PHYSICAL EXAM:  He does have palpable femoral and popliteal pulses.  I  do not palpate pedal pulses.  He has no tenderness over these areas.  I  discussed this at length again with the patient.  He does have chronic  stable claudication.  He is comfortable with this.  We have recommended  that he be continued on yearly followup of this.   Larina Earthly, M.D.  Electronically Signed   TFE/MEDQ  D:  06/26/2007  T:  06/29/2007  Job:  [id number]   cc:   [name]. Riley Kill, MD, [facility]

## 2010-09-25 NOTE — Assessment & Plan Note (Signed)
OFFICE VISIT   Roberto [name], Roberto [name]  DOB:  1936/01/20                                       08/11/2009  ZOXWR#:[id number]   Patient presents today for followup of his diffuse peripheral vascular  occlusive disease.  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  He reports  that he has continued to have difficulty with pneumonia but no new  cardiac difficulties since evaluation in December at [facility]. He is a nonsmoker, having quit 4 years ago.  Does not drink  alcohol.   Review of systems is reviewed, and his chart is unchanged.   PHYSICAL EXAMINATION:  A well-developed white male appearing stated age  of 45.  He is in no acute distress.  HEENT is normal.  Chest is clear  bilaterally.  Heart:  Regular rate and rhythm.  He has 2+ radial, 2+  femoral, 2+ popliteal pulses.  He does have prominent pulses, but I do  not feel any large aneurysms in his popliteal space.  He does have  palpable pedal pulses.  Musculoskeletal:  No major deformities or  cyanosis.  Neurologic:  No focal weakness, paresthesias.  Skin:  Without  ulcers or rashes.   He underwent duplex of his renal arteries, and this shows no change with  a 1.1 cm right popliteal and 1.4 severe left popliteal artery.   He also underwent a CTA of his chest, abdomen, and pelvis today, and I  have reviewed this with the patient.  This does show a complex thoracic  aneurysm beginning in his arch and extending to the level of the renal  arteries.  It ranges in size from 4.5 to 5.5 cm throughout its course.   I discussed this again with patient, explained that fortunately he has  had no increased size in his thoracoabdominal aneurysm.  I explained the  magnitude of repair, that with his cardiac and pulmonary comorbidities  would be extremely high risk for repair.  Since he has not have any  evidence of growth, I would recommend that we  continue to watch him at 6-  month intervals.  I did explain that if he did come to thoracoabdominal  aneurysm repair that we would entertain referring him to a center with a  large volume for this complex surgery.  We will see him in 6 months.     Larina Earthly, M.D.  Electronically Signed   TFE/MEDQ  D:  08/11/2009  T:  08/11/2009  Job:  [id number]

## 2010-09-25 NOTE — Assessment & Plan Note (Signed)
OFFICE VISIT   Roberto [name], Roberto [name]  DOB:  1935/05/19                                       11/17/2006  NGEXB#:[id number]   The patient is in today for continued followup of his bilateral  popliteal artery aneurysms and also for lower extremity arterial  insufficiency.  I had last seen him in December of 2006.  Since that  time he has had a pacemaker and defibrillator placed by Dr. Ladona Ridgel.  He  has complaints of bilateral calf difficulty, he reports also cramping in  his feet at night which I explained are not related to arterial  insufficiency.  He does have what sounds like classic lower extremity  claudication with calf discomfort with walking which is relieved with  rest.   MEDICAL HISTORY:  Otherwise unchanged.   PHYSICAL EXAMINATION:  Well-developed, well-nourished white male  appearing stated age, 43.  Blood pressure is 126/79, pulse 77,  respirations 18.  His radial pulses are 2+, he has 2+ femoral pulses.  He has 2+ left dorsalis pedis pulse, 1+ right dorsalis pedis pulse.  He  has prominent popliteal pulses bilaterally.  He underwent duplex of his  popliteal arteries showing no change with maximal diameter of just over  1 cm on the right popliteal artery and 1.5 cm in the left popliteal  artery which is unchanged.  Of note, he does have elevated velocity in  his mid superficial femoral artery of 237 cm per second.  His ankle-arm  index is down slightly, on the right 0.80, and the left is normal at  0.98.   I discussed options with the patient.  I explained that I am comfortable  with continued observation of his popliteal artery ectasia.  He does  have what sounds to be arterial insufficiency causing calf claudication.  I explained that this is certainly not limb threatening but is limiting  to  him.  Explained the next step would be arteriography for further  evaluation and possible angioplasty and stent placement.  He will  consider this and  notify us when he wishes to proceed.   Larina Earthly, M.D.  Electronically Signed   TFE/MEDQ  D:  11/17/2006  T:  11/18/2006  Job:  [id number]   cc:   Alfonse Alpers. Dagoberto Ligas, M.D.

## 2010-09-25 NOTE — Procedures (Signed)
VASCULAR LAB EXAM   INDICATION:  Followup evaluation of popliteal arteries. Known popliteal  artery aneurysm.   HISTORY:  Repair and graft of abdominal aortic aneurysm on August 01, 1998.   On December 12, 2004, ankle brachial index on the right was 0.94 and  greater than 1.0 on the left. The right popliteal artery measured 1.1 x  1.1 cm.   The left popliteal artery measured 1.5 x 1.4 cm.   Diabetes:  Not dictated.  Cardiac:  Not dictated.  Hypertension:  Not dictated.   EXAM:  The right popliteal artery measures 1.1 x 1.2 cm.   The left popliteal artery measures 1.4 x 1.4 cm.   The right mid superficial femoral artery peak systolic velocity is 237  cm per second.   IMPRESSION:  No significant change in the size of the popliteal arteries  bilaterally.   ___________________________________________  Larina Earthly, M.D.   MC/MEDQ  D:  11/17/2006  T:  11/18/2006  Job:  [id number]

## 2010-09-25 NOTE — Procedures (Signed)
VASCULAR LAB EXAM   INDICATION:  Followup evaluation of bilateral popliteal artery.   HISTORY:  Diabetes:  Yes.  Cardiac:  Yes.  Hypertension:  Yes.   EXAM:  Right popliteal artery largest measurement is 1.11 x 1.21 cm.  Left popliteal artery largest measurement is 1.47 x 1.38 cm.   IMPRESSION:  There is no significant change in size of bilateral  popliteal artery aneurysm since the last study.   ___________________________________________  Larina Earthly, M.D.   AC/MEDQ  D:  07/08/2008  T:  07/08/2008  Job:  [id number]

## 2010-09-25 NOTE — Assessment & Plan Note (Signed)
[facility]                            CARDIOLOGY OFFICE NOTE   NAME:STEPHENSKamdon, Reisig                      MRN:          [id number]  DATE:06/16/2007                            DOB:          01-02-1936    Mr. Eschmann is in for follow-up.  From a clinical standpoint, he is  stable.  He was recently admitted to the hospital with atrial  fibrillation.  He has a controlled ventricular response.  He converted  back to sinus rhythm.  He was placed on Coumadin anticoagulation, and  his Plavix was stopped.  He remains on aspirin.  He saw Dr. Dagoberto Ligas back  and still had crackles, so Avelox was restarted.   MEDICATIONS:  1. Zyrtec 10 mg daily.  2. Nexium 40 mg daily.  3. Lithium carbonate 300 mg p.o. b.i.d.  4. Enteric coated aspirin 81 mg daily.  5. Vasotec 2.5 mg p.o. b.i.d.  6. Actos 45 mg daily.  7. Januvia 100 mg daily.  8. Flovent two puffs b.i.d.  9. Furosemide 40 mg daily.  10.Digoxin 0.125 mg daily.  11.Carvedilol 12.5 mg b.i.d.  12.Avelox 400 mg x5 days.   PHYSICAL EXAMINATION:  VITAL SIGNS:  Blood pressure 110/70, pulse 50 and  regular.  LUNGS:  The lung fields were actually relatively clear with prolonged  expiration.  CARDIOVASCULAR:  The cardiac rhythm was regular today.  EXTREMITIES:  The extremities did not reveal significant edema.   Electrocardiogram demonstrates sinus bradycardia with nonspecific ST-T  wave abnormalities and left axis deviation.  There is a septal infarct  of indeterminate age.   IMPRESSION:  1. Coronary artery disease status post coronary artery bypass graft      surgery with prior percutaneous intervention of saphenous vein      graft with known saphenous vein graft disease.  2. Hypercholesterolemia with a history of Statin intolerance, now on      Crestor 5 mg daily.  3. History of thoracoabdominal aneurysm with recent finding on CT with      follow-up with Dr. Arbie Cookey, with also history of popliteal  aneurysms.  4. History of left ventricular dysfunction.  5. History of tobacco abuse, stopped several years ago.  6. History of ischemic cardiomyopathy, status post nonsustained      ventricular tachycardia with an automatic implanted cardioverter      defibrillator.  7. History of pneumonia recently.   PLAN:  1. We will get an ultrasound of the abdomen and lower extremities.  2. Appointment to be made with [name] Early in follow-up.  3. Continue follow-up with Dr. Dagoberto Ligas.  4. With institution of Avelox, check protime today.  5. Return to clinic in two weeks.  6. CBC and BMET.     [name]. Riley Kill, MD, [facility]  Electronically Signed    TDS/MedQ  DD: 06/16/2007  DT: 06/16/2007  Job #: [id number]

## 2010-09-25 NOTE — Assessment & Plan Note (Signed)
OFFICE VISIT   Roberto [name], Roberto [name]  DOB:  Oct 05, 1935                                       07/08/2008  ZOXWR#:[id number]   Patient presents today for continued followup of his known small  popliteal artery aneurysms.  He is status post prior resection of graft  for abdominal aortic aneurysm and left common femoral artery aneurysm.  He has known ectasia of his popliteal arteries and is seen today for  followup.  He reports that he has been doing well with no specific  cardiac difficulties.  He does have a defibrillator.  He reports that he  is able to do his usual activities without any difficulty.  He is a  diabetic, does have hypertension, elevated cholesterol, and COPD.   PHYSICAL EXAMINATION:  A well-developed and well-nourished white male  appearing stated age of 13.  His blood pressure is 132/70, pulse 80,  respirations 18.  His radial pulses are 2+ bilaterally.  He has 2+  femoral, 2+ popliteal, and 2+ posterior pulses bilaterally.  He does  have prominent popliteal artery pulses.   He underwent duplex today, and this shows no change.  Maximal diameter  on the right popliteal was 1.2, and the maximal diameter on the left was  1.4.  This is no significant change.   I did discuss symptoms of ischemia with patient and wife.  They will  notify us immediately should this occur, otherwise we will see him again  in 1 year with repeat imaging of his popliteal with ultrasound.  I also  explained the significance of his known suprarenal arterial ectasia.  He  had a CT scan a year ago showing maximal diameter of 4.3.  I will repeat  this in 1 year to rule out any enlargement.   Larina Earthly, M.D.  Electronically Signed   TFE/MEDQ  D:  07/08/2008  T:  07/11/2008  Job:  [id number]   cc:   [name]. Riley Kill, MD, [facility]  Alfonse Alpers. Dagoberto Ligas, M.D.

## 2010-09-25 NOTE — Letter (Signed)
June 21, 2008    Maretta Bees. Vonita Moss, M.D.  [address]  [place], [place] [address]   RE:  Roberto [name], Roberto [name]  MRN:  [id number]  /  DOB:  [birth year]   Dear Sharon Seller,   Roberto [name] is seen by [place].  The patient has had prior bypass surgery.  He has an ischemic cardiomyopathy with prior history of myocardial  infarction.  He currently has an implantable cardio defibrillator.  He  also has a thoracoabdominal aortic aneurysm as well as COPD.  He has had  prior stents placed to the saphenous vein graft and also atrial  fibrillation.  He does have a reasonable life expectancy.  Despite his  underlying problems as in all cases, a discontinuation of antiplatelet  and anticoagulant therapy does have some mild perhaps increase in risk  while being off.  I pointed this out to him.  That said, I do not think  proceeding with a biopsy would be an unreasonable approach if deemed  appropriate.    Sincerely,      [name]. Riley Kill, MD, [facility]  Electronically Signed    TDS/MedQ  DD: 06/21/2008  DT: 06/21/2008  Job #: [id number]

## 2010-09-25 NOTE — Discharge Summary (Signed)
NAMEJEDRICK, HUTCHERSON               ACCOUNT NO.:  [id number]   MEDICAL RECORD NO.:  [id number]          PATIENT TYPE:  OBV   LOCATION:  4737                         FACILITY:  [facility]   PHYSICIAN:  Madolyn Frieze. Jens Som, MD, FACCDATE OF BIRTH:  1935-06-03   DATE OF ADMISSION:  04/01/2008  DATE OF DISCHARGE:  04/02/2008                               DISCHARGE SUMMARY   PRIMARY CARDIOLOGIST:  Maisie Fus D. Riley Kill, MD, FACC   ELECTROPHYSIOLOGY:  Doylene Canning. Ladona Ridgel, MD   DISCHARGING DIAGNOSES:  1. Chest pain with negative cardiac workup this admission by EKG and      enzymes.  The patient pending outpatient stress Myoview at our      office.  2. Paroxysmal atrial fibrillation with tachy-palpitations and      associated chest discomfort.  The patient is symptom free upon      arrival to [facility] Emergency Room.  3. Allergies include MORPHINE, AMBIEN, and STATINS except for Crestor.   PAST MEDICAL HISTORY:  1. Coronary artery disease status post CABG x6.      a.     In 2001, PCI and bare-metal stent to the vein graft to the       OM-1 and OM-2, and at that time, vein graft to the RCA with       totaled.      b.     Cardiac catheterization in April 2006 revealed medically       manageable disease with 5/6 patent grafts.  2. Ischemic cardiomyopathy status post ICD with a [employer] single-      lead device in April 2006.  3. History of atrial flutter status post radiofrequency ablation in      2006.  4. Hypertension.  5. Hyperlipidemia.  6. Tachybrady syndrome/atrial fibrillation, on chronic Coumadin.  7. Type 2 diabetes.  8. Peripheral vascular disease status post aorto-bifemoral bypass.  9. COPD.  10.Abdominal aortic aneurysm.  11.Diverticulosis.  12.Remote history of tobacco use.  13.Manic depression.   HOSPITAL COURSE:  Mr. Seavey is a very pleasant 75 year old Caucasian  gentleman with past medical history as stated above who presented on day  of admission complaining of chest  discomfort in the setting of tachy-  palpitations and paroxysmal atrial fib.  The patient anticoagulated on  Coumadin already.  Discomfort began after eating soup.  He experienced  right upper abdominal epigastric discomfort and indigestion resolved  with 3 Tums served and belching, then felt tachy-palpitations similar to  his atrial fib with associated shortness of breath and chest discomfort.  Tachy-palpitations continued for about an hour.  The patient presented  to [employer] where they documented heart rate in the  120s.  EMS was called.  The patient was transported to [facility] in  sinus rhythm with PVCs at a rate in the 90s.  Chest pain resolved with  resolution of palpitations.  In the ER, his point-of-cares were negative  x2.  EKG showed sinus rhythm at a rate of 78, hematocrit 38.2,  creatinine of 1.3.  The patient was admitted for observation, ruled out  for myocardial  infarction by EKG and cardiac enzymes.  The patient  remained stable, and TSH 4.333, total cholesterol 175, triglycerides 78,  HDL 42, LDL 117.  PT/INR 24.8 and 2.1 respectively.  The patient was  seen by Dr. Olga Millers.  The patient is to be discharged home on  previously prescribed medications.  Follow up with Dr. Riley Kill and  follow up with an outpatient stress Myoview, also will need a BMET done  at the time of followup secondary to mild renal insufficiency.   MEDICATIONS AT TIME OF DISCHARGE:  1. Zyrtec 10 mg daily or as previously prescribed.  2. Lithium 300 mg b.i.d.  3. Aspirin 81.  4. Actos 45.  5. Flovent 2 puffs b.i.d.  6. Enalapril 2.5 daily.  7. Lanoxin 0.625 mg daily.  8. Furosemide 40 mg daily.  9. Nexium 40 mg b.i.d.  10.Carvedilol 12.5 mg b.i.d.  11.Coumadin as previously prescribed.  12.Xopenex nebulizer p.r.n.  13.Crestor 5 mg daily.  14.Januvia 100 mg daily.  15.Potassium 10 mEq daily.   Office will call the patient with date and time for followup and stress   Myoview.      Dorian Pod, ACNP      Madolyn Frieze. Jens Som, MD, [facility]  Electronically Signed    MB/MEDQ  D:  04/02/2008  T:  04/02/2008  Job:  [id number]   cc:   Alfonse Alpers. Dagoberto Ligas, M.D.

## 2010-09-25 NOTE — Assessment & Plan Note (Signed)
[facility]                            CARDIOLOGY OFFICE NOTE   NAME:STEPHENSChord, Takahashi                      MRN:          [id number]  DATE:04/25/2008                            DOB:          Oct 30, 1935    [name] is in today for a followup visit.  In general, he has been  stable.  He notes a little bit of indigestion, but no typical burning  discomfort associated with exertion.  Importantly, since I last saw him  he presented recently with some tachy palpitations followed by chest  pain.  He was seen in the emergency room after having presented to the  local fire department where they documented a rate in the 120s.  EMS was  called upon their arrival, he was placed on a monitor, and he was in  sinus rhythm with PVCs in the 90s.  Once his palpitations resolved, his  chest pain resolved and he was taken to [facility] ED.  After arrival  there, his hemoglobin was 11.9, platelet count was 112, hematocrit 35,  BUN and creatinine were normal, and his glucose was minimally elevated  at 126.  His cardiac enzymes were normal.  His LDL was 117 on this  admission.  The patient was subsequently discharged with plans for  followup Myoview.  He had bibasilar atelectasis by chest x-ray, but  would clinically was not felt to have pneumonia.  He does have some mild  shortness of breath with overexertion, but this has been relatively  chronic.  Myoview was subsequently done as an outpatient since I last  saw him.  This demonstrated a large anteroapical infarct with small  inferior wall infarct with no ischemia and an ejection fraction of 39%.  These represent basically chronic findings.  Previous ejection fraction  was 38% on a radionuclide study in October 2004 and at that time, there  was a large anteroapical defect as well.  The findings are somewhat  difficult to discern because there is some good activity and whether the  defect is bigger is little bit hard to  tell, although not necessarily  decidedly so importantly, the ejection fraction remains virtually  identical to what has been previously described.   CURRENT MEDICATIONS:  1. Enalapril 2.5 mg p.o. b.i.d.  2. Zyrtec 10 mg daily.  3. Crestor 5 mg daily.  4. KCl 10 mEq daily.  5. Warfarin as directed.  6. Spiriva 18 mcg daily .  7. Symbicort 2 puffs b.i.d.  8. Ipratropium 2 sprays daily.  9. Carvedilol 12.5 mg one-half tablet b.i.d.  10.Nexium 40 mg daily.  11.Lithium carbonate ER 300 mg b.i.d.  12.Furosemide 40 mg b.i.d.  13.Aspirin 81 mg daily.  14.Januvia 100 mg daily.  15.Actos 45 mg daily.   PHYSICAL EXAMINATION:  GENERAL:  He is an alert and oriented gentleman  in no acute distress.  VITAL SIGNS:  The weight is 184 pounds similar to what he was in  November, blood pressure is 108/74, the pulse is 75.  LUNGS:  There is decreased breath sounds bilaterally.  I do not hear  definite  crackles in the left base.  There is no obvious fever.  CARDIAC:  The PMI is nondisplaced.  There is a S4 gallop.  EXTREMITIES:  Do not reveal significant edema.   Last echocardiogram was done in January 2009.  This demonstrated an  ejection fraction calculated at 40%, so there is consistency.  There is  an anteroapical wall motion abnormality consistent with the findings.   IMPRESSION:  1. Coronary artery disease status post coronary artery bypass graft      surgery, last cardiac catheterization 2006 demonstrating a      moderately severe reduction in left ventricular function.      Calculated ejection fraction at that time was 32%, with subsequent      nuclear findings of 39 and echo findings of 40% ejection fraction,      continued patency of the internal mammary to the diffusely diseased      left anterior descending (coronary artery), continued patency of      the saphenous vein graft to the D1 and D2, continued patency of the      saphenous vein graft to the first obtuse marginal artery and  second      obtuse marginal artery with abnormalities in both vein grafts and      total occlusion of the right coronary with total occlusion of his      saphenous vein graft to the right coronary artery with a      recanalized vessel to the distal vessel and collaterals from the      atrioventricular circumflex.  2. Diabetes mellitus on two-drug therapy.  3. Reduced overall left ventricular systolic function.  4. Status post atrial flutter ablation, May 2006.  5. Known peripheral vascular disease.  6. Severe chronic obstructive pulmonary disease.  7. 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  8. Hyperlipidemia on lipid lowering therapy.   PLAN:  1. Continued medical followup.  2. Consider recatheterization if any increase in symptoms, but hold      for now given the findings of radionuclide imaging study.  3. Return to clinic in 4-6 weeks for followup.     [name]. Riley Kill, MD, [facility]  Electronically Signed    TDS/MedQ  DD: 05/16/2008  DT: 05/17/2008  Job #: [id number]   cc:   Alfonse Alpers. Dagoberto Ligas, M.D.

## 2010-09-25 NOTE — Procedures (Signed)
VASCULAR LAB EXAM   INDICATION:  Follow up bilateral popliteal artery aneurysms.   HISTORY:  Diabetes:  Yes.  Cardiac:  Yes.  Hypertension:  Yes.   EXAM:  Bilateral popliteal artery duplex.   IMPRESSION:  1. Biphasic Doppler waveforms noted throughout the bilateral popliteal      arteries with maximum diameter measurements of 1.1 X 1.1 cm in the      right popliteal artery and 1.4 X 1.3 cm in the left popliteal      artery.  2. Mild nonocclusion plaque formations noted in the bilateral      popliteal arteries.  3. No significant change in the maximum diameters when compared to the      previous examination on 07/08/2008.   ___________________________________________  Larina Earthly, M.D.   CH/MEDQ  D:  08/11/2009  T:  08/11/2009  Job:  [id number]

## 2010-09-25 NOTE — Assessment & Plan Note (Signed)
[facility]                            CARDIOLOGY OFFICE NOTE   NAME:STEPHENSLisa, Blakeman                      MRN:          [id number]  DATE:01/14/2007                            DOB:          1936/02/03    [name] is in for followup.  From a clinical standpoint, he is  stable.  He did have to quit his Crestor because of discomfort in his  legs.  Gegick started him on another statin, and this has had to be  stopped as well.  He has also seen Dr. Arbie Cookey and  who has recommended  continued following his popliteal aneurysms and with regard to his  peripheral vascular obstruction, also recommended probably conservative  therapy unless the patient really wanted to have something done.  It is  not terribly limiting.  The patient is not smoking currently.   PHYSICAL EXAMINATION:  The weight is 178, blood pressure 122/64 and  pulse 68.  The lungs fields have real decreased breath sounds but  without rales.  Cardiac rhythm is regular.   A defibrillator check was done today.  He has had some nonsustained  ventricular beats mostly of short duration.   The electrocardiogram today reveals normal sinus rhythm with left axis  deviation and septal infarct of indeterminate age.  There is nonspecific  T wave inversion.   IMPRESSION:  1. Coronary disease.  Status post coronary artery bypass graft      surgery.  2. History of ischemic cardiomyopathy with nonsustained ventricular      tachycardia status post AICD.  3. Status post atrial arrhythmia.  4. Status post ablation.  5. History of some left ventricular dysfunction.  6. History of statin intolerance.  7. History of tobacco use now stopped.   PLAN:  1. Return to clinic in 6 months.  2. Continue current medical regimen.  3. Defer cholesterol management to Dr. Dagoberto Ligas.     [name]. Riley Kill, MD, [facility]  Electronically Signed    TDS/MedQ  DD: 01/14/2007  DT: 01/14/2007  Job #: [id number]

## 2010-09-25 NOTE — Assessment & Plan Note (Signed)
[facility]                            CARDIOLOGY OFFICE NOTE   NAME:STEPHENSBertis, Hustead                      MRN:          [id number]  DATE:12/03/2007                            DOB:          Jan 22, 1936    Mr. Pender is in for followup.  He is generally doing pretty well.  He  was recently admitted with a COPD exacerbation.  He has seen Dr. [name]  back in followup, and he tells me that he has had followup x-rays with  both Dr. Dagoberto Ligas as well as Dr. [name].  He denies any ongoing chest pain.  The patient has had prior coronary artery bypass graft surgery and non-  ST-elevation MI.  He has also had implantation of a single implantable  defibrillator lead.  He has tachy-brady syndrome with atrial  fibrillation, but today he seemed somewhat slower.   Today, on exam, blood pressure is 110/66, the pulse is 55.  The lung  fields are clear.  Cardiac rhythm is occasionally irregular.  There is  prolonged expiration compatible with COPD.   His electrocardiogram demonstrates sinus bradycardia with some long  pauses and ventricular escape with kicking in of his ventricular  defibrillator lead.   I suspect, that he is stable.  He has not been having any chest pain.  Because of his moderate bradycardia, I am going to go ahead and stop his  very low-dose Lanoxin.  He has had rapid ventricular response, but  currently is on carvedilol at 12.5 mg twice a day.  My hope is that that  would control his rate should he go into atrial fibrillation.  We will  see him back in 4 weeks' time, at which time we will reassess his status  off Lanoxin.  Hopefully, he will continue to improve.     [name]. Riley Kill, MD, [facility]  Electronically Signed    TDS/MedQ  DD: 12/03/2007  DT: 12/04/2007  Job #: [id number]   cc:   Alfonse Alpers. Dagoberto Ligas, M.D.  Coralyn Helling, MD

## 2010-09-25 NOTE — Assessment & Plan Note (Signed)
[name]                            CARDIOLOGY OFFICE NOTE   NAME:STEPHENSRaysean, [name]                      MRN:          [id number]  DATE:09/02/2007                            DOB:          08/26/1935    [name] was in for a followup visit. He recently had what sounds  like a subconjunctival hemorrhage. He stopped his Coumadin for a couple  of days and then followed up. He did not have any hemoptysis or any  other major problems. He called the PA on call at that time. In general,  he is getting along quite well. He has his baseline shortness of breath  but it has not progressed.   MEDICATIONS:  1. Enalapril 2.5 mg daily.  2. Warfarin as directed.  3. Crestor 5 mg daily.  4. Lanoxin 0.0625 mg daily.  5. Kay Ciel 10 mEq daily.  6. Zyrtec 10 mg daily.  7. Nexium 40 mg daily.  8. Lithium carbonate 300 mg p.o. b.i.d.  9. Enteric coated aspirin 81 mg daily.  10.Actos 45 mg daily.  11.Januvia 100 mg daily.  12.Flovent 2 puffs b.i.d.  13.Furosemide 40 mg daily.  14.Carvedilol 12.5 mg p.o. b.i.d.  15.Xopenex 2 puffs b.i.d.   PHYSICAL EXAMINATION:  He is alert and oriented and in no specific  distress.  His weight is 169 pounds which is similar to baseline. His blood  pressure is slightly elevated today at 150/84 and the pulse is 52 and  regular.  The lung fields actually reveal some decreased breath sounds, which is  normal.  The cardiac rhythm is regular. There is not a specific murmur noted on  exam.   His electrocardiogram  demonstrates moderate sinus bradycardia with a  ventricular rate of 53.   IMPRESSION:  1. Coronary artery disease status post coronary artery bypass graft      surgery with prior percutaneous intervention of the saphenous vein      graft with known saphenous vein graft disease.  2. Hypercholesterolemia with a history of statin intolerance.  3. History of thoracoabdominal aneurysm with a popliteal aneurysm.  4.  History of some left ventricular dysfunction.  5. History of tobacco use.  6. History of ischemic cardiomyopathy status post implantation of an      automatic implantable defibrillator.  7. History of pneumonia.  8. History of thoracoabdominal aneurysm with patchy lower densities      but history of recent pneumonia.   PLAN:  1. Continue medical regimen.  2. Continue low dose Lanoxin for now.  3. Return to clinic in 3 months.     [name]. Riley Kill, MD, [facility]  Electronically Signed    TDS/MedQ  DD: 09/05/2007  DT: 09/05/2007  Job #: [id number]

## 2010-09-25 NOTE — Assessment & Plan Note (Signed)
[facility]                         ELECTROPHYSIOLOGY OFFICE NOTE   NAME:[name], Roberto [name]                      MRN:          [id number]  DATE:02/02/2008                            DOB:          March 08, 1936    PULMONOLOGIST:  [facility] Hair. Artist Pais, DO   ENDOCRINOLOGIST:  Alfonse Alpers. Dagoberto Ligas, M.D.   ELECTROPHYSIOLOGIST:  Dr. Ladona Ridgel.   CARDIOLOGIST:  [facility] Morton. Riley Kill, MD, [facility]   This patient has intolerance to BETA-BLOCKERS and intolerance to  STATINS.   PRESENTING CIRCUMSTANCE:  I seem to be getting short of breath and  tired lately and when I checked my heart, it is going slowly.   HISTORY OF PRESENT ILLNESS:  Roberto [name] is a 75 year old male.  He has  a history of ischemic heart disease.  He had heart attacks both in 1995  and in 2001.  At the 1995 attack, he had coronary artery bypass graft  surgery; at the non-STEMI in 2001, he had a stent to the saphenous vein  graft to the obtuse marginal #1.  He has a history of atrial  arrhythmias.  He is status post atrial flutter ablation in April 2006.  He also had a presentation in April 2009 with atrial fibrillation, rapid  ventricular rate.  This was a new diagnosis, it caused exacerbation of  his COPD.  He was started on Coumadin at that time and continues with  that now.  He has  aortoiliac occlusive disease.  He is status post  aortobifemoral bypass.  He does have claudication.  He has a history of  bilateral popliteal aneurysms.  He also has a history of a  thoracoabdominal aortic aneurysm.   The patient had a Medtronic Maximo [id number] with a [id number] right ventricular  lead implanted on August 28, 2004.  This was for ischemic cardiomyopathy,  ejection fraction 28% and a history of myocardial infarction.  The  patient has never had an ICD discharge.  The device is set with a low  rate of 40.   The patient also has tachybrady, has atrial fibrillation in the spring  and April 2009, reached heart rates in the  200s.  The patient is on  medications to control high rate and once again the device backup pacing  is 40 beats per minute.   With recommendations for therapy in the setting of bradycardia, the  patient wore a Holter monitor in August, the results are present.  His  low rates seem to be at 47.  There were times when he was doing bigeminy  where his effective rate was even less than that.  His device was  interrogated today and the decision was made in consultation with Dr.  Ladona Ridgel to increase his base rate from 40 beats per minute to 55 beats  per minute.  This was done without complication.  The patient was  discharging today from the office.   PAST MEDICAL HISTORY:  1. Ischemic cardiomyopathy, history of myocardial infarction in 1995      with subsequent coronary artery bypass graft surgery.  2. Ejection fraction of 30-40% on echocardiogram 2009.  3.  NSTEMI 2001 with PCI to the saphenous vein graft to the first      obtuse marginal, PCI to the saphenous vein graft to obtuse marginal      #1.  4. Implant of a Medtronic Maximo single-chamber cardioverter-      defibrillator with 6949 LV lead August 28, 2004.  5. History of atrial flutter status post ablation in April 2006.  6. Aortoiliac occlusive disease status post aortobifemoral bypass.  7. History of thoracoabdominal aortic aneurysm followed by vascular      surgery.  8. Claudication, history of bilateral popliteal artery aneurysms.  9. Atrial fibrillation, new diagnosis April 2009, started on Coumadin      at that time, it exacerbated COPD.  10.History of COPD with recent adjustments of medications and the      patient is breathing better lately.  11.Hypertension.  12.Dyslipidemia.  13.Diabetes.  14.Remote history of tobacco habituation.   MEDICATIONS:  1. Potassium chloride 10 mEq daily.  2. Crestor 5 mg daily at bedtime.  3. Coumadin.  This dose is (he has just [facility] to [facility])      2.5 mg on Monday, 5 mg on  other days.  4. Enalapril 2.5 mg daily.  5. Spiriva 18 mcg/inhalation, 2 inhalations daily.  6. Symbicort 2 puffs twice daily.  7. Ipratropium 2 sprays daily.  8. Coreg 6.25 mg twice daily.  Recent adjustment by Dr. Dagoberto Ligas for      bradycardia.  9. Nexium 40 mg daily.  10.Lithium carbonate ER 300 mg twice daily.  11.Furosemide 40 mg daily.  12.Enteric-coated aspirin 81 mg daily.  13.Januvia 100 mg daily.  14.Actos 45 mg daily.  15.Zyrtec 10 mg daily.  16.Xopenex HFA 2 puffs as needed.  17.Flovent HFA inhaler 2 puffs daily.   PHYSICAL EXAMINATION:  GENERAL:  The patient is alert and oriented x3.  He does not look like he is in any particular acute distress, it is  mentioned that he is still quite active, playing off on a daily basis.  He and his wife both mentioned he is breathing better since Pulmonary  adjusted his medications.  VITAL SIGNS:  The patient's weight is 179.8 pounds, heart rate is 60,  and blood pressure 138/70.  I took it as well and his heart rate was 52  when I took it.  NECK:  His neck veins are flat.  LUNGS:  Relatively clear bilaterally.  HEART:  Irregular rate and rhythm and very slow.  ABDOMEN:  Soft and nondistended.  Bowel sounds are present.  EXTREMITIES:  No evidence of clubbing, cyanosis, or edema.  NEUROLOGICAL:  He is grossly intact.  I had the patient walk down the  hall and back and when he came back, his heart rate was 68.  The patient  is not exhibiting chronotropic incompetence.   The plan is to increase the base rate of the device from 40 beats per  minute to 55 beats per minute per Dr. Ladona Ridgel.  He also has a followup at  the [facility] on Wednesday February 24, 2008, at 8 o'clock, and he  will see her Dr. Riley [name], Friday February 26, 2008 at 3:30.      Maple Mirza, PA  Electronically Signed      Doylene Canning. Ladona Ridgel, MD  Electronically Signed   GM/MedQ  DD: 02/02/2008  DT: 02/03/2008  Job #: [id number]

## 2010-09-25 NOTE — H&P (Signed)
NAMEQUINTEN, ALLERTON               ACCOUNT NO.:  [id number]   MEDICAL RECORD NO.:  [id number]          PATIENT TYPE:  EMS   LOCATION:  MAJO                         FACILITY:  [facility]   PHYSICIAN:  Rollene Rotunda, MD, FACCDATE OF BIRTH:  [birth year]   DATE OF ADMISSION:  04/01/2008  DATE OF DISCHARGE:                              HISTORY & PHYSICAL   PRIMARY CARDIOLOGIST:  [name]. Riley Kill, MD, [facility]   ELECTROPHYSIOLOGIST:  Doylene Canning. Ladona Ridgel, MD.   PATIENT'S PROFILE:  This is a 75 year old Caucasian male with history of  CAD, ischemic cardiomyopathy, and paroxysmal atrial fibrillation who  presents with tachy palpitations and associated chest discomfort.   PROBLEMS:  1. CAD.  1a.  Status post CABG x6 placement of the LIMA to the LAD, sequential  vein graft to the OM1 and OM2, sequential vein graft to the D1 and D2,  vein graft to the RCA.  1b.  November 12, 1999, PCI and bare-metal stenting in the vein graft to  OM1/2.  Vein graft to the RCA was totaled at that time.  1c.  Last cardiac catheterization August 13, 2004, revealed medically  manageable disease, 5/6 patent grafts.  1. Ischemic cardiomyopathy.  2a.  Status post Medtronics single-lead AICD April, 2006.  1. History of flutter status post radiofrequency ablation April, 2006.  2. Hypertension.  3. Hyperlipidemia.  Intolerance to all statins except Crestor.  4. Tachybrady syndrome/atrial fibrillation, on chronic Coumadin.  5. Type 2 diabetes mellitus.  6. Peripheral vascular disease status post aortobifemoral bypass.  7. COPD.  8. Thoracoabdominal aortic aneurysm.   HISTORY OF PRESENT ILLNESS:  This is a 75 year old Caucasian male with  history as above.  He was in his usual state of health until this  afternoon when after eating soup he developed right upper abdominal  epigastric discomfort and indigestion, resolved after 3 Tums, soda, and  belching.  He then felt tachy palpitations similar to his atrial  fibrillation and  associated with shortness of breath and mild chest  pressure similar to his previous angina.  Tachy palpitations and chest  pain persisted for about an hour and he presented to his local fellow  department where they documented heart rate in the 120s.  EMS was called  and upon their arrival he was placed on a monitor.  He was noted be in  sinus rhythm with PVCs with rates in the 90s.  Chest pain resolved once  his palpitations are resolved.  He was taken to the [facility] ED.  Currently, he is symptom free.  He is eager to go home and his point-of-  care markers are negative x2.   ALLERGIES:  MORPHINE, AMBIEN, and STATINS except Crestor.   HOME MEDICATIONS:  1. Crestor 5 mg daily.  2. Zyrtec 10 mg daily.  3. Lithium 300 mg b.i.d.  4. Potassium chloride 10 mEq daily.  5. Aspirin 81 mg daily.  6. Actos 45 mg daily.  7. Flovent 2 puffs b.i.d.  8. Enalapril 5 mg daily.  9. Lasix 40 mg daily.  10.Nexium 40 mg daily.  11.Coreg 6.25 mg b.i.d.  12.Coumadin  as directed.  13.Xopenex 45 mcg 2 puffs b.i.d. p.r.n.  14.Januvia 100 mg daily.  15.Spiriva 18 mcg inhaled daily.  16.Symbicort 2 puffs b.i.d.   FAMILY HISTORY:  Mother died in her 43s of CVA.  Father died at 81 of  lung cancer, MI in his 21s.  He has 5 siblings, 2 of which has CAD.   SOCIAL HISTORY:  Lives in [place] with his wife.  He is a retired  Curator.  He has an 80-pack-year history of tobacco abuse, quitting 4  years ago.  He rarely has a beer.  He denies drug use.  He plays golf  twice a week, but rides the cart to the ball, hits it, and then gets  back in the cart.   REVIEW OF SYSTEMS:  Positive for chest pain, dyspnea, tachy  palpitations, history of diabetes, indigestion, and urinary frequency in  the setting of Lasix usage.   PHYSICAL EXAMINATION:  VITAL SIGNS:  Temperature 97.5, heart rate 77,  respirations 18, blood pressure 165/95, pulse ox 96% on room air.  GENERAL:  Pleasant white male in no acute  distress.  Awake, alert, and  oriented x3.  HEENT:  Normal.  SKIN:  Warm and dry without lesions or masses.  NECK:  No bruits or JVD.  LUNGS:  Respirations regular and unlabored.  Crackles at bilateral  bases.  CARDIAC:  Regular S1, S2.  No S3, S4, or murmurs.  ABDOMEN:  Round, soft, nontender, nondistended.  Bowel sounds present  x4.  EXTREMITIES:  Warm, dry, and pink.  No clubbing, cyanosis, or edema.  Dorsalis pedis, posterior tibial pulses are 1+ equal and  bilaterally.  No petechia.  NEUROLOGIC:  Grossly intact, nonfocal.  MUSCULOSKELETAL:  Without effusions and good range of motion in  bilateral upper and lower extremities.   LABORATORY DATA:  Chest x-ray shows mild left basilar atelectasis versus  airspace disease, pneumonia cannot be excluded.  EKG shows sinus rhythm  at a rate of 78, left axis, poor R-wave progression, left anterior  fascicular block, T-wave inversion in aVL.  Hemoglobin 12.4, hematocrit  38.2, WBC 7.2, platelets 143.  Sodium 143, potassium 4.1, chloride 108,  CO2 of 29, BUN 30, creatinine 1.3, glucose 116.  Cardiac markers are  negative x2.   ASSESSMENT AND PLAN:  1. Unstable angina and coronary artery disease.  The patient presents      with an episode of chest discomfort in the setting of tachy      palpitations, which he identifies as atrial fibrillation.  So far      point-of-care markers are negative and ECG showed no acute changes.      Plan to admit and cycle cardiac markers.  If point-of-care enzymes      are negative, we will plan to discharge in the morning and likely      follow up with an outpatient Myoview.  We will continue his home      regimen.  2. Paroxysmal atrial fibrillation.  The patient is currently in sinus      rhythm.  He is on chronic Coumadin.  We will continue beta-blocker      and Coumadin.  3. Hypertension.  Blood pressure is currently elevated.  Resume home      meds and follow up.  4. Hyperlipidemia.  Continue Crestor  therapy, which he does tolerate.  5. Ischemic cardiomyopathy/chronic systolic heart failure.  The      patient appears to be euvolemic.  Continue beta blocker,  ACE      inhibitor, and diuretics.  6. Diabetes mellitus.  Continue home regimen.  7. Chronic obstructive pulmonary disease.  He is not currently      wheezing.  Continue inhalers.      [name], ANP      Rollene Rotunda, MD, [facility]  Electronically Signed    CB/MEDQ  D:  04/01/2008  T:  04/02/2008  Job:  [id number]

## 2010-09-26 ENCOUNTER — Other Ambulatory Visit (INDEPENDENT_AMBULATORY_CARE_PROVIDER_SITE_OTHER): Payer: Medicare Other

## 2010-09-26 DIAGNOSIS — Z5181 Encounter for therapeutic drug level monitoring: Secondary | ICD-10-CM

## 2010-09-26 DIAGNOSIS — Z7901 Long term (current) use of anticoagulants: Secondary | ICD-10-CM

## 2010-09-26 DIAGNOSIS — I4891 Unspecified atrial fibrillation: Secondary | ICD-10-CM

## 2010-09-27 ENCOUNTER — Other Ambulatory Visit: Payer: Medicare Other

## 2010-09-27 LAB — PROTIME-INR: Prothrombin Time: 24.2 seconds — ABNORMAL HIGH (ref 11.6–15.2)

## 2010-09-28 NOTE — Assessment & Plan Note (Signed)
[facility]                              CARDIOLOGY OFFICE NOTE   NAME:STEPHENSBronson, Bressman                      MRN:          [id number]  DATE:01/16/2006                            DOB:          01/30/1936    [name] is in for a followup visit and generally is doing quite well.  He is not been having any ongoing chest pain.  He quit smoking about six  months ago.  He still has some sinus congestion, but overall his symptoms  have improved.  He thinks that this has helped quite a bit.  He has  continued to see Dr. Dagoberto Ligas and having a lot of his lab studies done over  there.   MEDICATIONS:  His medications include:  1. Genova 100 mg daily.  2. Actos 45 mg daily.  3. Vasotec 2.5 b.i.d.  4. Cardura 2 mg daily.  5. Crestor 10 mg q. h.s.  6. Enteric coated aspirin 81 mg daily.  7. Lithium carbonate 300 mg b.i.d.  8. Plavix 75 mg daily.  9. Nexium 40 q. day.  10.Triamterene/HCTZ 37/25 q.day.  11.Zyrtec 10 daily.   PHYSICAL EXAMINATION:  GENERAL:  On physical examination he is an alert,  oriented gentleman, no acute distress.  VITAL SIGNS:  Blood pressure 136/72.  Pulse 45.  LUNGS:  The lung fields are clear, although breath sounds are diminished  bilaterally compatible with his underlying COPD.  CARDIAC:  Rhythm is regular with a PMI that is non-displaced.  There is a S4  gallop.  Bilaterally the popliteal arteries are palpable and perhaps  slightly widened.  The distal pulses are intact.   EKG reveals marked sinus bradycardia with left axis deviation.  There is  delay in R-wave progression.   The patient is stable and has underlying coronary artery disease, on a  medical regiment.  He has not been having any ongoing chest pain.  Patient  has had an atrial flutter ablation, implantation of a Medtronic single  chamber defibrillator.  He has not had progression of symptoms and if  anything is improved from a symptomatic standpoint.  He does need  a basic  metabolic profile but has been having his labs done frequently at Dr.  Jerelene Redden office, and we have given him a note to get followup labs over  there.  He will remain on the current medical regimen.  I will see him back  in followup  in six months, sooner if any problems in the interim.  He is to  contact us.  I have encouraged him to continue with hold on smoking.   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  I discussed this with Dr.  Samule Ohm today and we will get repeat studies to follow the progression of  this.  He has had no peripheral symptoms from this.                              [name]. Riley Kill, MD, [facility]  TDS/MedQ  DD:  01/16/2006  DT:  01/16/2006  Job #:  [id number]   cc:   Alfonse Alpers. Dagoberto Ligas, M.D.

## 2010-09-28 NOTE — Cardiovascular Report (Signed)
[place]  Patient:    Roberto [name], Roberto [name] Visit Number: [id number] MRN: [id number]          Service Type: MED Location: [id number] Attending Physician:  Colon Branch Dictated by:   Jonelle Sidle, M.D. Proc. Date: 01/01/01 Adm. Date:  01/01/2001                          Cardiac Catheterization  DATE OF BIRTH:  [birth year]  PRIMARY [place] CARDIOLOGIST:  [name]. Riley Kill, M.D.  PROCEDURES PERFORMED:  Left heart catheterization, selective coronary angiography, left ventriculography.  DESCRIPTION OF PROCEDURE:  After informed consent was obtained, the patient as taken to the cardiac catheterization lab.  He was prepped and draped in the usual sterile fashion, and the area about the right femoral artery was anesthetized with 1% lidocaine.  A 6 French sheath was placed in the artery via the modified Seldinger technique.  Selective coronary angiography and bypass graft angiography including three saphenous vein grafts and a left internal mammary artery graft was performed using JL4 and JR4 catheters.  A left ventriculogram was performed using a angled pigtail catheter.  The patient tolerated the procedure well without obvious complications.  HEMODYNAMICS:  Left ventricle 168/20 mmHg (postangiography).  Aorta 168/91 mmHg.  ANGIOGRAPHIC FINDINGS: 1. The left main coronary artery is free of significant flow-limiting    coronary artery disease. 2. The left anterior descending coronary artery has a diffuse 70%    stenosis proximally culminating in a total occlusion at the mid    vessel level distal to a large septal perforator.  Competitive    flow is seen more distally given the left internal mammary artery    graft.  The first and second diagonal branches are small vessels that    are occluded proximally.  These vessels fill by a saphenous vein graft    in sequential fashion. 3. The circumflex coronary artery has a 70%  proximal stenosis as well as    a 95% distal stenosis.  There are two obtuse marginal branches that    are occluded but are seen to fill via a saphenous vein graft in sequential    fashion.  The second obtuse marginal branch is a bifurcating branch which    is the larger of the two and has an appropriately 60% stenosis at its    mid vessel level. 4. Right coronary artery:  The right coronary artery is diffusely diseased at    approximately 70% proximally culminating in an occlusion at mid vessel    level. The posterior descending branch is seen to fill faintly via    left to right collaterals. 5. The saphenous vein graft to the posterolateral branch is occluded at    its origin. 6. The sequential saphenous vein graft to the first and second obtuse marginal    branches is patent but diffusely diseased.  The proximal stent site is    patent. 7. The sequential saphenous vein graft to the first and second diagonal    branches are patent.  There is a 50-70% ostial stenosis followed by a    70% mid graft stenosis.  Flow is TIMI-3. 8. The left internal mammary artery graft to left anterior descending is    patent but atretic.  The graft was not selectively engaged given    significant difficulty manipulating catheters due to tortuosity of    the aorta and subclavian system.  No significant stenoses were observed    and distal flow into the LAD is TIMI-3.  LEFT VENTRICULOGRAM:  The left ventriculogram shows mildly reduced left ventricular performance and an ejection fraction estimated at 45%.  There appears to be anteroapical hypokinesis, although significant ectopy makes thi finding less certain.  No significant mitral regurgitation was noted.  DIAGNOSES: 1. Multivessel coronary artery disease as described.  There does not appear    to be any particular progression of stenoses compared with cardiac    catheterization from July of 2001. 2. Occluded saphenous vein graft to the posterior  descending artery as noted    previously. 3. Patent but diffusely diseased sequential saphenous vein graft to the first    and second obtuse marginal branches.  The proximal stent site is patent. 4. Patent but diseased sequential saphenous vein graft to the first and second    diagonal branches.  There is a 50-70% ostial stenosis as well as a 70%    mid graft stenosis.  Compared with prior films, there appears to be mild    progression of both stenoses.  However, TIMI-3 flow is observed. 5. Patent but atretic left internal mammary artery graft to left anterior    descending. 6. Mildly reduced left ventricular ejection fraction estimated at 45% with    possible anteroapical hypokinesis, somewhat difficulty to determine    given significant ectopy.  RECOMMENDATIONS:  At this point, discussed possible percutaneous coronary intervention involving the sequential saphenous vein graft to the first and second diagonal branches with patient and Dr. Chales Abrahams.  At this point, we will plan to follow serial cardiac enzymes.  If abnormalities are noted, then plan will be to proceed with percutaneous coronary intervention as described.  If enzymes are reassuring then will consider an adenosine Cardiolite to evaluate for the presence of ischemia in the lateral distribution.  A similar strategy was adopted one year ago and no significant ischemia was noted at that time. The patients clinical presentation at this point is not entirely similar to his presentation in July of 2001. Dictated by:   Jonelle Sidle, M.D. Attending Physician:  Colon Branch DD:  01/01/01 TD:  01/02/01 Job: [id number]

## 2010-09-28 NOTE — Consult Note (Signed)
NAMEJAYVON, Roberto [name]               ACCOUNT NO.:  [id number]   MEDICAL RECORD NO.:  [id number]          PATIENT TYPE:  INP   LOCATION:  2038                         FACILITY:  [facility]   PHYSICIAN:  Doylene Canning. Ladona Ridgel, M.D.  DATE OF BIRTH:  [birth year]   DATE OF CONSULTATION:  08/13/2004  DATE OF DISCHARGE:                                   CONSULTATION   Consultation is requested by [name]. Riley Kill, M.D.   INDICATION FOR CONSULTATION:  Evaluation and consideration for ICD  implantation in a patient with sinus node dysfunction and palpitations.   HISTORY OF PRESENT ILLNESS:  The patient is a 75 year old man who was  hospitalized most recently several weeks ago at [facility]  Prior to  this, he had had an outbreak of zoster.  The patient was initially admitted  with chest pain back in March, but it was thought that this was noncardiac  in nature.  He was discharged home.  He did not rule in for an MI at that  hospitalization.  He underwent a 2-D echo which showed an EF of 30-40% and  an adenosine Myoview which showed no evidence of ischemia and an EF of 28%.  The patient was readmitted to the hospital several days ago with increasing  fatigue and weakness and a remote history of palpitations.  He has never had  syncope.  He was noted to be bradycardic in the hospital with heart rates  down into the high 30s.   PAST MEDICAL HISTORY:  Notable for bypass surgery with a LIMA to the LAD and  saphenous vein grafts to the PDA and diagonal branches and marginal  branches.  This was carried out in 1996.  As noted before, he had a recent  zoster exacerbation.  Additional past medical history is notable for  hypertension.   SOCIAL HISTORY:  The patient lives in [place] with his wife for many  years.  He has a history of tobacco use for 50 years but does not smoke  presently.  He denies alcohol abuse.   FAMILY HISTORY:  Notable for mother dying at age of 49 of strokes.  His  father died at  age 71 of lung cancer.   REVIEW OF SYSTEMS:  Notable for a several-pound weight gain in the last  several years.  He denies vision or hearing problems, although he does wear  glasses for visual acuity.  He denies difficulty swallowing.  Denies nausea,  vomiting, diarrhea or constipation.  He denies polyuria, polydipsia, heat or  cold intolerance.  He denies skin changes.  CONSTITUTIONAL SYMPTOMS:  He  does have some fatigue and dyspnea.  He also notes palpitations.  He denies  any problems with anemia or easy bruisability.  He denies weakness, numbness  or other neurologic problems.  He denies any problems with his gait.   PHYSICAL EXAMINATION:  GENERAL:  He is a pleasant, well-appearing 68-year-  old man in no distress.  VITAL SIGNS:  Blood pressure was 121/72, the pulse was 45 and regular, the  respirations were 16, temperature was 97.  His weight was  179 pounds.  HEENT:  Exam of the head was normocephalic and atraumatic.  The eyes  revealed pupils that were equal and round.  The sclerae were anicteric.  There were no conjunctival hemorrhages noted.  The oropharynx was moist.  There were no exudates.  NECK:  No jugular venous distention.  There was no thyromegaly.  The trachea  was midline.  CHEST:  The lungs were clear bilaterally to auscultation, no wheezes, rales  or rhonchi.  There was no increased work of breathing.  CARDIOVASCULAR:  Regular rate and rhythm with normal S1 and S2.  The PMI was  not laterally displaced, but it was somewhat enlarged.  There were no  murmurs, rubs or gallops present.  The pulses were 2+ and symmetric  throughout.  ABDOMEN:  Soft, nontender, nondistended.  Bowel sounds were present.  There  was no organomegaly.  SKIN:  No obvious bruises or other lesions.  NEUROLOGIC:  Alert and oriented x3 with cranial nerves II-XII intact.  The  strength was 5/5 and symmetric.   The EKG demonstrates sinus bradycardia with prior septal MI and left axis   deviation.  There are nonspecific ST-T wave abnormalities present.   IMPRESSION:  1.  Ischemic cardiomyopathy.  2.  Congestive heart failure presently with class II symptoms.  3.  Status post myocardial infarction with left ventricular dysfunction and      an ejection fraction ranging from between 28% on Cardiolite to 30-40% by      echo.   DISCUSSION:  I have recommended that the patient undergo a catheterization  for his fatigue and chest pain.  Depending on the results of this, if his LV  function is, in fact, below 35% by left ventriculogram, then he would  qualify for ICD implantation.  Since he has sinus bradycardia, would also  recommend a dual-chamber device.      GWT/MEDQ  D:  08/13/2004  T:  08/13/2004  Job:  [id number]   cc:   [name]. Riley Kill, M.D. [facility]   Alfonse Alpers. Dagoberto Ligas, M.D.  [address]  [place]  [place] [address]  Fax: [phone]

## 2010-09-28 NOTE — Assessment & Plan Note (Signed)
[facility]                           ELECTROPHYSIOLOGY OFFICE NOTE   NAME:STEPHENSKenya, Shiraishi                      MRN:          [id number]  DATE:02/06/2006                            DOB:          Nov 28, 1935    [name] was seen today in the clinic on February 06, 2006 for follow  up of his Medtronic model 7232 Maximal.  Date of implant was August 28, 2004  for ischemic cardiomyopathy.  On interrogation of his device today, his  battery voltage is 3.17 with a charge time of 7.45 seconds.  R-waves  measured 10.2 mV with a ventricular pacing threshold of 1 volt at 0.2 msec  and a ventricular lead impedance of 416 ohms.  Shock impedance was 48 ohms.  There were 12 nonsustained episodes at this last interrogation.  I did  program onset today to the on position and he will see a [facility]  transmission at three, six and nine months' time with a return office visit  in one year.      ______________________________  Altha Harm, LPN    ______________________________  Doylene Canning. Ladona Ridgel, MD    PO/MedQ  DD:  02/06/2006  DT:  02/08/2006  Job #:  [id number]

## 2010-09-28 NOTE — H&P (Signed)
[place]. [facility]  Patient:    KYLEY, LAUREL Visit Number: [id number] MRN: [id number]          Service Type: MED Location: 1800 [id number] [id number] Attending Physician:  Armanda Heritage Dictated by:   Abelino Derrick, P.A.C. [facility] Adm. Date:  01/01/2001                           History and Physical  CHIEF COMPLAINT:  Epigastric pain.  HISTORY OF PRESENT ILLNESS:  Mr. [name] is a pleasant 75 year old male followed by Dr. Riley Kill and Dr. Dagoberto Ligas with a history of coronary disease.  He had bypass surgery in 1996 by Dr. Laneta Simmers.  His grafts were SVG to the first and second diagonal, SVG to the OM 1 and OM 2, SVG to the PDA, and LIMA to the LAD.  He was last admitted with unstable angina in June 2001, and underwent stenting of the SVG to OM 1 and OM 2.  At that time other grafts were patent, and his EF was estimated at 40%.  He did have some disease in the SVG to the diagonal of 50-70%; this was noted to be diffusely diseased.  The patient has done well from a cardiac standpoint until recently.  Earlier this week on Tuesday he was golfing and had an episode of epigastric pain, followed by some mild diaphoresis and near syncope.  This was relieved spontaneously.  Since then he has had intermittent epigastric discomfort, partially relieved by belching.  He has not taken nitroglycerin.  He presented to the emergency room this morning with recurrent symptoms.  In the emergency room he was bradycardic with a heart rate of 35-40 but tolerating it well.  He is currently pain-free.  He denies any syncope.  On review of his old record, it appears he was somewhat bradycardic during his last admission when he had his angioplasty.  This seemed to improve after his angioplasty, and we feel this may be somewhat ischemic-related.  He is admitted now for further evaluation and diagnostic catheterization.  PAST MEDICAL HISTORY: 1. Peripheral vascular disease. 2. Aortobifemoral bypass  grafting three years ago by Dr. Arbie Cookey. 3. Reflux and a hiatal hernia. 4. Previous colectomy in 1995, for diverticular disease. 5. BPH.  He has been seen by [name] for this in the past.  CURRENT MEDICATIONS: 1. Prevacid 30 mg a day. 2. Lopressor 25 mg a day. 3. Lithium 300 mg b.i.d. 4. Zyrtec 10 mg a day. 5. Plavix q.d. 6. Cardura 4 mg at h.s. 7. Maxzide q.d. 8. Aspirin q.d.  ALLERGIES:  No known drug allergies.  SOCIAL HISTORY:  He is an ex-smoker.  He is married.  He works Comptroller, and he has been working daily.  FAMILY HISTORY:  Coronary disease.  He has a sister followed by our group who has coronary disease.  Father died age 104 of cancer.  REVIEW OF SYSTEMS:  Essentially unremarkable except as noted above.  There is no history of peptic ulcer disease or GI bleeding.  He has had an endoscopy in the past.  He does have a history of hiatal hernia.  He does have some chronic trouble starting his flow.  PHYSICAL EXAMINATION:  VITAL SIGNS:  Blood pressure 145/82, pulse 45, O2 saturation 92.  GENERAL:  Well-developed, well-nourished male in no acute distress.  HEENT:  Normocephalic.  Extraocular movements are intact.  Sclerae is nonicteric.  NECK:  Without JVD and  without bruit.  CHEST:  Clear to auscultation and percussion.  CARDIAC:  Regular rate and rhythm.  Without obvious murmur or rub.  ABDOMEN:  Midline surgical scar.  No obvious aortic enlargement.  EXTREMITIES:  No edema.  Positive distal pulses.  NEUROLOGIC:  Grossly intact.  He is awake, alert, oriented, and cooperative.  LABORATORY DATA:  EKG reveals sinus rhythm, sinus bradycardia, with T-wave inversion in II, III, aVF, V3 through V5.  Chest x-ray reveals COPD and cardiac enlargement.  Tortuosity of the thoracic aorta, with evidence of retrocardiac calcium.  IMPRESSION: 1. Unstable angina. 2. Sinus bradycardia, possibly related to #1. 3. Coronary disease, status post coronary artery  bypass grafting in 1996, with    stent of the SVG to OM 1 and OM 2 in July 2001, SVG to RCA occluded at that    time. 4. Moderate LV dysfunction with EF of 40%. 5. Treated hypertension. 6. Reflux. 7. Peripheral vascular disease, status post aortobifemoral bypass graft.  PLAN:  The patient is to be admitted to the CCU.  Start on IV heparin and set up for a catheterization, possibly this afternoon.  Will also check a TSH. His TSH in July 2001 was 5.9. Dictated by:   Abelino Derrick, P.A.C. [facility] Attending Physician:  Armanda Heritage DD:  01/01/01 TD:  01/01/01 Job: [id number]

## 2010-09-28 NOTE — Discharge Summary (Signed)
Roberto [name]. Roberto [name]  Patient:    Roberto [name], Roberto [name] Visit Number: [id number] MRN: [id number]          Service Type: MED Location: 6500 [id number] [id number] Attending Physician:  Roberto [name] Dictated by:   Roberto [name], P.A. Adm. Date:  01/01/2001 Disc. Date: 01/02/01   CC:         Roberto [name]. Roberto [name], M.D.   Discharge Summary  DATE OF BIRTH:  1935-06-21  BRIEF HISTORY:  [name] is a pleasant 75 year old male with a history of coronary artery disease.  He underwent coronary artery bypass grafting in 1996.  He had a stent placed in July 2001.  A catheterization in July 2001 showed a saphenous vein graft to the first and second diagonals with a 50-70% lesion, saphenous vein graft to OM I and OM II had a 90% lesion that was stented, saphenous vein graft to the PDA was occluded, LIMA to the LAD was patent, ejection fraction was estimated to be 40%.  The patient was admitted to [name] on January 01, 2001 with epigastric pain.  On the Tuesday prior to admission he had a near syncopal episode with diaphoresis.  In the emergency room his heart rate was noted to be 35-40, although he appeared to be tolerating it well.  PAST MEDICAL HISTORY:  Significant for the above noted coronary artery bypass graft surgery in 1996, history of BPH, history of hypertension, history of gastroesophageal reflux disease.  The patient is also on lithium for history of headaches.  ALLERGIES:  No known drug allergies.  [name] COURSE:  As noted, this patient was admitted to Roberto [name] through the emergency room with epigastric pain and bradycardia with rates in the 30s-40s.  He was placed on heparin and a low dose dopamine drip.  His Lopressor and Cardura were held.  His blood pressure on admission was 143/78.  The patient underwent cardiac catheterization on January 01, 2001.  The left main had no significant coronary artery disease.  The LAD had a  70% proximal lesion and then was occluded.  The circumflex had a 70% proximal and a 95% distal.  The RCA had a 70% proximal and then was occluded.  Saphenous vein graft to the PDA was occluded.  Saphenous vein graft to OM I and OM II revealed a patent stent.  Saphenous vein graft to the first and second diagonal had a 50-70% ostial as well as a 70% mid.  The LIMA to the LAD was patent, but was small.  Ejection fraction was estimated to be 45%.  The patient was scheduled for an Adenosine Cardiolite that was performed on August 23.  This revealed no ischemia.  Ejection fraction 35-40%.  There was an old septal MI noted.  Arrangements were made to discharge the patient home later that evening.  LABORATORIES:  A PTT was 32.  Troponin I was negative.  Chemistries revealed BUN 10, creatinine 1.1, potassium 3.5, sodium 139.  CBC revealed hemoglobin 12.9, hematocrit 38.2, WBC 6200, platelets 140,000.  CK-MB enzymes were negative.  A TSH was 4.2.  Chemistries on admission revealed a potassium of 3.8.  A chest x-ray showed cardiomegaly, but no active disease.  EKG showed marked sinus bradycardia, rate 49 beats per minute with left axis deviation, pulmonary disease pattern, an old septal infarct, and converted T-waves V4-V6 as well as 1 and aVL.  DISCHARGE MEDICATIONS: 1. Prevacid 30 mg b.i.d. 2. Triamterene/hydrochlorothiazide 37.5/25 one q.d. 3. Lithium 300  mg b.i.d. 4. Zyrtec 10 mg q.d. 5. Aspirin 325 mg q.d. 6. Cardura 4 mg q.h.s. 7. Plavix 75 mg q.d. 8. Nitroglycerin p.r.n. 9. The patient was taken off his Lopressor.  DISCHARGE INSTRUCTIONS:  The patient was told not to drive or do anything strenuous for at least two days.  He was to be on a low salt, low fat diet. He was told to call the office if he had any significant swelling, bleeding, or drainage from his catheterization site.  He was told to come to the office Monday for a blood pressure check.  He was to follow-up with Dr.  Riley [name] September 19 at 11:30, Dr. Dagoberto [name] as scheduled.  PROBLEM LIST:  1. Epigastric pain.  Negative cardiac enzymes.  2. Cardiac catheterization performed August 22.  Please [name] results as noted     above.  3. Bradycardia at time of admission.  4. Coronary artery bypass graft surgery in 1996.  Please [name] grafts as noted     above.  5. History of stent in July 2001.  6. LV dysfunction.  EF approximately 40% by catheterization and Cardiolite.  7. History of headaches treated with lithium.  8. BPH.  9. Hypertension. 10. Gastroesophageal reflux disease. Dictated by:   Roberto [name], P.A. Attending Physician:  Roberto [name] DD:  01/02/01 TD:  01/02/01 Job: [id number]

## 2010-09-28 NOTE — H&P (Signed)
NAME:  DMETRIUS, AMBS               ACCOUNT NO.:  [id number]   MEDICAL RECORD NO.:  [id number]          PATIENT TYPE:  EMS   LOCATION:  MAJO                         FACILITY:  [facility]   PHYSICIAN:  Thomas C. Wall, M.D.   DATE OF BIRTH:  [birth year]   DATE OF ADMISSION:  07/30/2004  DATE OF DISCHARGE:                                HISTORY & PHYSICAL   HISTORY:  Mr. Haskett is a 75 year old white male who presented to [facility] emergency room with his family secondary to chest discomfort. Mr.  Evenson states that he was treated by his primary care physician  approximately eight weeks ago for shingles. He states that he had pain and a  rash extending from a rash extending from his neck down through the left  axilla and across his left chest. He was given pain medications and  something for the rash, but he states that he has not needed to take any of  these for the last seven weeks because it passed very quickly. However,  since that time he has had several episodes of left anterior chest  discomfort which he describes as a red hot poker going into his chest  associated with flip flopping and fast heart beat. He has not checked his  pulse during these episodes and these episodes last approximately from 2 to  10 minutes. He thinks he has had two to three episodes today and he recalls  on Saturday two episodes while working at [employer]. With these  episodes he does note a slight increase of shortness of breath. He denies  any nausea, vomiting, or diaphoresis. He does not think it resembles his  prior myocardial infarction. It is not pleuritic nor is it associated with  water brash or near syncope. Today after his last episode he decided that he  wanted to go see a physician about it, thus his wife called EMS.   PAST MEDICAL HISTORY:  He states he is allergic to MORPHINE which makes his  crazy. His medications prior to admission include Zyrtec 10 mg daily,  Lopressor 25 mg daily,  Cardura 2 mg q.h.s., triamterene/HCTZ 37.5/25 daily,  Nexium 40 mg daily, Plavix 75 mg daily, lithium 300 mg b.i.d., aspirin 81 mg  daily, Crestor 10 mg q.h.s., Mobic 7.5 mg p.r.n., nitroglycerin 0.4 p.r.n.,  Tylenol p.r.n.  He takes Airborne, an herbal remedy, occasionally for cold.  He has not used this recently and albuterol p.r.n. His medical history is  notable for hyperlipidemia, probable COPD, hypertension, peripheral vascular  disease, status post aortobifemoral in 1999 with normal ABIs in 2002.  GERD/hiatal hernia, diverticulosis, status post colectomy in 1995, BPH  treated by [name], known coronary artery disease and acute MI in  January 1996 resulting in six-vessel bypass surgery in January 1996 with  LIMA to the LAD, saphenous vein graft to the PDA, saphenous vein graft to  the diagonal-1 and diagonal-2 and saphenous vein graft to the OM-1 and OM-2.  Non-Q-wave MI in July 2001, status post stenting of the saphenous vein graft  to OM-1 and OM-2. Last catheterization  was in January 01, 2001, with an EF of  45%. Please refer to dictated report. Last stress Cardiolite February 14, 2003.  He only achieved 78% predicted maximum with an EF of 38%; apical,  anterior, and inferior scar; no ischemia; EF slightly decreased compared to  stress test in January 2002.   SOCIAL HISTORY:  He resides in [place] with his wife of 47 years. He is a  retired Market researcher. He is one daughter and one grandson. No great-  grandchildren. He states that he has not bee smoking for approximately one  year. Prior to that he smoked at least a half a pack per day for 50 years.  He denies alcohol, drugs, specific diet, or exercise program.   FAMILY HISTORY:  His mother died at the age of 9 after several CVAs. His  father at the age of 45 with lung cancer and he has a history of heart  problems. One brother deceased from lung cancer, one sister from liver  cancer. Three sisters, one of which has a  history of cerebral aneurysm and  heart problems.   REVIEW OF SYSTEMS:  Notable for weight gain, he is not sure how much.  Headaches, two nose bleeds in the last month, glasses, hearing loss with  bilateral artificial eardrums, partial dentures. Occasional wheezing.  Chronic dyspnea on exertion. Nocturia. Arthralgias in the hips and knees.  GERD.   PHYSICAL EXAMINATION:  GENERAL: A well-developed, well-nourished, pleasant  white male in no apparent distress.  VITAL SIGNS: Temperature 97.8, blood pressure 143/70, pulse 81, respirations  24, and 98% saturation on room air.  HEENT: Normocephalic and atraumatic. PERRLA. EOMs intact.  NECK: Supple without thyromegaly, adenopathy, JVD, or carotid bruits.  CHEST: Symmetrical excursion. Lung sounds are diminished, but clear to  auscultation. He did not have any rash or soreness on his chest wall.  HEART: Slightly irregular. Normal S1 and S2 without murmurs, rubs, clicks,  or gallops. All pulses are symmetrical and intact without abdominal or  femoral bruits.  SKIN/INTEGUMENT: He did have a left axillary cyst.  ABDOMEN: Bowel sounds present, soft without organomegaly, masses, or  tenderness.  EXTREMITIES: Negative clubbing, cyanosis, or edema.  MUSCULOSKELETAL/NEUROLOGIC: Unremarkable.   EKG shows normal sinus rhythm with a rate of 62, normal axis, normal  intervals. He is having PACs, left anterior hemiblock, delayed R-wave and  lateral T-wave inversion which is similar to an EKG on August 2002. H&H is  14.1 and 41.5, and the remainder of labs are pending.   IMPRESSION:  1.  Atypical chest pain associated with palpitations probably related to      post hepatic syndrome.  2.  Recent treatment for shingles.  3.  Hypertension.  4.  Known coronary artery disease, status post bypass surgery with systolic      left ventricular dysfunction. Please see past medical history.  PLAN:  Dr. Daleen Squibb reviewed the patient's history, spoke with and  examined the  patient. We will admit him for 24-hour observation. If he rules out for  myocardial infarction and there is no dysrhythmia on telemetry we will  consider an outpatient echo, Myoview  and event recorder with follow-up with  Dr. Riley Kill. We will add enalapril 2.5 mg b.i.d. with his LV dysfunction and  we will change his Lopressor to 12.5 mg b.i.d. for 24-hour coverage.      EW/MEDQ  D:  07/30/2004  T:  07/30/2004  Job:  [id number]   cc:   Alfonse Alpers. Dagoberto Ligas, M.D.  [address]  [place]  [place] [address]  Fax: [phone]   [name]. Riley Kill, M.D. [facility]

## 2010-09-28 NOTE — Discharge Summary (Signed)
NAMEEVENS, MENO               ACCOUNT NO.:  [id number]   MEDICAL RECORD NO.:  [id number]          PATIENT TYPE:  INP   LOCATION:  6525                         FACILITY:  [facility]   PHYSICIAN:  [name]. Riley Kill, M.D. [facility] OF BIRTH:  05-Jun-1935   DATE OF ADMISSION:  08/27/2004  DATE OF DISCHARGE:  08/29/2004                                 DISCHARGE SUMMARY   DISCHARGE DIAGNOSES:  1.  Admitted August 27, 2004 with tachy palpitations, awakening the patient      from sleep.  His symptoms at that time were chest discomfort, flushing,      throat tightness, dyspnea.  2.  Known sinus node dysfunction, tachy/brady syndrome.  3.  Discharging day #1 status post electrophysiology study.      1.  (radiofrequency catheter ablation of easily inducible typical atrial          flutter with 2:1 conduction, creation of bidirectional block at the          atrial flutter isthmus).      2.  (status post implantation of Medtronic MAXIMO VR [id number] with          defibrillator threshold study less than/equal to 10 joules, Dr.          Lewayne Bunting).  4.  Post implantation chest x-ray shows lead is in the appropriate position      with no pneumothorax.  5.  Interrogation of the device post procedure day #1, all values within      normal limits.   SECONDARY DIAGNOSES:  1.  History of septal myocardial infarction.  2.  Coronary artery disease, status post coronary artery bypass grafting      surgery.  3.  Left heart catheterization April 2006.  The left internal mammary artery      to the left anterior descending is patent, reverse saphenous vein graft      to the right coronary artery is total, sequential saphenous vein graft      to the first diagonal and second diagonal is patent with diffuse      disease.  A sequential saphenous vein graft from the aorta to the first      obtuse marginal and to the second obtuse marginal has only diffuse      irregularities.  Ejection fraction at catheterization  was 32%.  4.  Aortoiliac occlusive disease status post aortobifemoral bypass 1999.  5.  Class II congestive heart failure symptoms.  6.  Hypertension.  7.  Dyslipidemia.  8.  Chronic obstructive pulmonary disease with ongoing tobacco habituation.  9.  Diverticulosis.  10. Status post colectomy 1995 in setting of diverticulosis.  11. Family history of coronary artery disease.  12. Recent hospitalization with herpes zoster.   PROCEDURES:  1.  August 28, 2004:  Electrophysiology study.  2.  Radiofrequency catheter ablation of easily inducible typical atrial      flutter with creation of bidirectional block at the atrial flutter      isthmus.  3.  Implantation of Medtronic cardioverter defibrillator with defibrillator      threshold study less than  or equal to 10 joules, [name].   DISPOSITION:  Mr. Kowalke was ready for discharge post procedure day #1.  He has maintained sinus rhythm throughout this hospitalization. He has  continued sinus rhythm post radiofrequency catheter ablation. His lungs are  clear.  He has no edema.  Blood pressure is 130/70.  Heart rate is 60.  He  is not complaining of chest pain or shortness of breath.  He does have some  soreness at the incision site.  The incision itself is healing nicely  without drainage or erythema.  The patient discharges on the following  medications.   DISCHARGE MEDICATIONS:  1.  Enteric coated aspirin 81 mg daily.  2.  Plavix 75 mg daily.  3.  Imdur 15 mg daily.  4.  Zyrtec 10 mg daily.  5.  Cardura 2 mg daily.  6.  Vasotec 2.5 mg twice daily.  7.  Triamterene/hydrochlorothiazide 37.5/25 one tablet daily.  8.  Nexium 40 mg daily.  9.  Crestor 10 mg daily at bedtime.  10. Lithium carbonate 300 mg twice daily.  11. Mobic 7.5 mg daily.  12. Nitroglycerin tablets 0.4 mg one tablet under the tongue every 5 minutes      x3 doses as needed for chest pain.  13. For pain at the incision site patient may take Tylenol 325 mg  one to two      tablets every 4 to 6 hours as needed.   ACTIVITY:  Patient has been given a mobility sheet to govern left upper  extremity movement.   DIET:  Low sodium, low cholesterol diet.   FOLLOW UP:  He will follow up at [facility] on Wednesday, Sep 12, 2004 at 9:30 in the morning and see  Dr. Ladona Ridgel in July and Dr. Lubertha Basque office will call with that appointment.   BRIEF HISTORY:  Mr. Brucato is a 75 year old male who has a history of  tachybrady syndrome and is beta blocker intolerant.  He has a history of  coronary artery disease and ischemic cardiomyopathy with an ejection  fraction of 32% by recent catheterization.  He is in the midst of an  extensive work up which would involve electrophysiology study,  radiofrequency catheter ablation of atrial flutter as well as implantation  of ICD.  The patient awakened with these tachy palpitations at about 11:30  in the evening of August 26, 2004.  These were symptomatic causing chest  discomfort, flushing, throat tightness and dyspnea.  The patient presented  to the emergency room at [facility] and at the time of  arrival he was in sinus rhythm.  Plan would be for admission to the  hospital. He has already ruled out for myocardial infarction on the basis of  troponin-I study which is less than 0.05.  The patient will be seen by Dr.  Lewayne Bunting and most probably scheduled for continuing procedure on August 28, 2004 as originally scheduled.   HOSPITAL COURSE:  Mr. Gregg Winchell was admitted to [facility]  through the emergency room after having been awakened with tachy  palpitations which were symptomatic.  The patient was in sinus rhythm at the  time of admission to the emergency room and has maintained sinus rhythm.  He  was seen by [name] and the planned work up which had been scheduled for August 28, 2004 was to go forward as planned.  The  patient  underwent electrophysiology study with radiofrequency catheter ablation of  typical atrial flutter and ICD implantation as described above.  The patient  has had no respiratory distress or cardiac dysrhythmias during this  hospitalization and is discharging post procedure day #1 in stable,  satisfactory condition with his incision healing nicely.  Follow up will be  as indicated in two weeks at the [facility] to check his incision  and see Dr. Ladona Ridgel in three months for possible adjustment of his ICD  parameters.   LABORATORY DATA:  Complete blood count this admission white blood cell count  7.1, hemoglobin 12.5, hematocrit 36, platelet count 160,000.  Serum  electrolytes this admission sodium 140, potassium 3.7, chloride 106,  carbonate 28, BUN 16, creatinine 1.1, glucose 128.  Pro Time was 12.7, INR  1.0.  PTT was 30.  Liver function tests with alkaline phosphatase 69, SGOT  16, SGPT 15.  Cardiac markers as mentioned above with myoglobin 4.3, CK-MB  less than 1.0, troponin-I studies less than 0.05.      GM/MEDQ  D:  08/29/2004  T:  08/29/2004  Job:  [id number]   cc:   Alfonse Alpers. Dagoberto Ligas, M.D.  [address]  [place]  [place] [address]  Fax: [phone]   Doylene Canning. Ladona Ridgel, M.D.

## 2010-09-28 NOTE — Assessment & Plan Note (Signed)
[facility]                            CARDIOLOGY OFFICE NOTE   NAME:Roberto [name]                      MRN:          [id number]  DATE:07/16/2006                            DOB:          22-Dec-1935    Roberto [name] is in for follow up. He continues to follow up with Dr.  Dagoberto Ligas. He has not been having any recurrent chest pain. He does have  some moderate shortness of breath. However, there has been absolutely no  progression of symptoms since we saw last time.   CURRENT MEDICATIONS:  1. Zyrtec 10 mg daily.  2. Triamterene/hydrochlorothiazide 37/25 mg daily.  3. Nexium 40 mg daily.  4. Plavix 75 mg daily.  5. Lithium carbonate 300 mg b.i.d.  6. Enteric coated aspirin 81 mg daily.  7. Crestor 10 mg nightly.  8. Cardura 2 mg daily.  9. Vasotec 2.5 mg b.i.d.  10.Actos 45 mg.  11.Januvia daily, prescribed by Dr. Dagoberto Ligas I believe, at 100 mg daily.   PHYSICAL EXAMINATION:  GENERAL: He is alert and oriented in acute  distress.  VITAL SIGNS: Weight 182 pounds, blood pressure 120/64, pulse 65.  LUNGS: Fields reveal prolonged expiration with slightly diminished  breath sounds, compatible with his underlying chronic obstructive  pulmonary disease.  CARDIAC: The PMI is non-displaced with an S4 gallop. Distal pulses are  intact.   Electrocardiogram demonstrates normal sinus rhythm. There is delay when  R-wave progression with T-wave inversion. There are PACs with pauses  with atrial bigeminy. Compared to the previous tracings, the atrial  bigeminy is new. T-wave changes in the lateral leads are chronic and the  left axis deviation is chronic with delayed R-wave progression as well.   IMPRESSION:  1. Coronary artery disease with known saphenous vein graft disease on      prior catheterizations, last study 08/13/2004.  2. History of prior implantable cardio-debrillator implantation.   PLAN:  1. Continue aggressive medical therapy.  2. Enforce no  smoking recommendations.  3. Continued follow up with Dr. Dagoberto Ligas for both diabetes and      dyslipidemia.   ADDENDUM   The patient also has had a prior flutter ablation.     [name]. Riley Kill, MD, [facility]  Electronically Signed    TDS/MedQ  DD: 07/21/2006  DT: 07/21/2006  Job #: [id number]

## 2010-09-28 NOTE — Discharge Summary (Signed)
NAMEMASIN, SHATTO               ACCOUNT NO.:  [id number]   MEDICAL RECORD NO.:  [id number]          PATIENT TYPE:  INP   LOCATION:  3743                         FACILITY:  [facility]   PHYSICIAN:  Jesse Sans. Wall, M.D.   DATE OF BIRTH:  1936-03-26   DATE OF ADMISSION:  07/30/2004  DATE OF DISCHARGE:  08/02/2004                           DISCHARGE SUMMARY - REFERRING   REASON FOR ADMISSION:  Mr. Meir is a 75 year old male, well-known to Dr.  Bonnee Quin, with longstanding history of coronary artery disease, status  post myocardial infarction/bypass surgery in 1996, who presented with  atypical chest pain in the setting of recent episode of shingles.  Please  refer to dictated admission note for full details.   LABORATORY DATA:  Serial cardiac markers normal.  CBC normal on admission.  Electrolytes, renal function, and liver enzymes normal on admission.  BNP  32.  TSH 5.09.   Admission chest x-ray:  CABG; no congestive heart failure.   HOSPITAL COURSE:  The patient was admitted for further evaluation and  management of chest pain, in the setting of known coronary artery disease,  as well as complaint of palpitations, all in the setting of recent bout of  shingles.   Serial cardiac markers were all within normal limits.  Note dysrhythmia  noted on telemetry.   The patient was closely followed by Dr. Bonnee Quin, who reviewed his most  recent coronary angiogram in 2002 which revealed severe native CAD with 100%  occlusion of the SVG-PL graft, atretic LIMA-LAD graft, with otherwise patent  grafts to the marginals and diagonals.  Dr. Riley Kill thus recommended in-  house evaluation:  The patient was referred for adenosine Myoview testing.  He did report mild chest discomfort during infusion, albeit different from  his presenting symptoms.  Perfusion images, however, revealed no evidence of  ischemia; remote apical myocardial infarction; EF 28%.   Two-dimensional echocardiogram  suggested ejection fraction of approximately  30-40%; akinesis of entire periapical wall and akinesis of mid-distal/septal  wall.   Given the patient's complaint of exertional chest discomfort, however,  recommendation was to add low-dose Imdur and to observe overnight.  The  following morning, he reported that his discomfort had resolved, but that he  had now developed a headache.  Plan is to thus decrease Imdur to 15 mg daily  at time of discharge.   ADDITIONAL MEDICATION ADJUSTMENTS THIS ADMISSION:  Addition of Vasotec,  change in Lopressor to 12.5 mg b.i.d.   MEDICATIONS AT DISCHARGE:  1.  Imdur 15 mg daily (new).  2.  Vasotec 2.5 mg b.i.d. (new).  3.  Lopressor 12.5 mg b.i.d.  4.  Zyrtec 10 mg daily.  5.  Cardura 2 mg nightly.  6.  Triamterene/hydrochlorothiazide 37.5/25 mg daily.  7.  Nexium 40 mg daily.  8.  Plavix 75 mg daily.  9.  Aspirin 81 mg daily.  10. Lithium 300 mg b.i.d.  11. Crestor 10 mg nightly.  12. Nitrostat 0.4 mg as directed.   INSTRUCTIONS:  Follow up with Dr. Vivien Rota, P.A.-C on Friday,  August 10, 2004.  Office will call patient with scheduled appointment time.   DISCHARGE DIAGNOSES:  1.  Chest pain/known coronary artery disease.      1.  Normal serial cardiac markers.      2.  Non-ischemic adenosine Myoview; ejection fraction 28%, August 01, 2004.      3.  Ejection fraction 30-40%; akinesis of entire periapical wall -- 2-D          echocardiogram, July 31, 2004.      4.  Status post myocardial infarction/coronary artery bypass graft in          1996.  2.  Headache.  3.  Recent shingles.  4.  History of hypertension.  5.  Dyslipidemia.      GS/MEDQ  D:  08/02/2004  T:  08/02/2004  Job:  [id number]   cc:   Alfonse Alpers. Dagoberto Ligas, M.D.  [address], [address]  [place]  [place] [address]  Fax: [phone]

## 2010-09-28 NOTE — H&P (Signed)
NAME:  Roberto [name], Roberto [name]               ACCOUNT NO.:  [id number]   MEDICAL RECORD NO.:  [id number]          PATIENT TYPE:  INP   LOCATION:  3743                         FACILITY:  [facility]   PHYSICIAN:  Gene Serpe, P.A. [facility]   DATE OF BIRTH:  [birth year]   DATE OF ADMISSION:  07/30/2004  DATE OF DISCHARGE:  08/02/2004                                HISTORY & PHYSICAL   Roberto [name] is a 75 year old male, well known to Dr. [name] with a  longstanding history of coronary artery disease who was recently  hospitalized here at [facility] (March 20th through March 23rd)  with symptoms felt to be atypical for angina pectoris. He ruled out for  myocardial infarction, was referred for an adenosine Myoview by Dr. Riley Kill,  and this revealed evidence of remote apical myocardial infarction with no  evidence of ischemia; calculated ejection fraction of 28%.   Of note, no dysrhythmias were noted on telemetry during his brief stay.   The patient was discharged on  low dose Imdur for management of what was  felt to be mild exertional-induced chest discomfort.   The patient presented to me in follow-up. He complains of a significant  headache on the Imdur and continues to remain convinced that his chest  discomfort is related to his shingles. He does, however, also report  development of heart thumping sensation and reveals recordings on his  wrist monitor of heart rates in the 140-149 range. Heart rate in the 140s  with associated blood pressure 106/76. He associates some mild dizziness and  dyspnea with these episodes, which last approximately 2-5 minutes in  duration. There is no associated chest discomfort. Electrocardiogram reveals  an NSR at 61 BPM with left axis deviation, and poor R-wave progression.   ALLERGIES:  MORPHINE.   CURRENT MEDICATIONS:  1.  Imdur 15 mg daily.  2.  Zyrtec 10 mg daily.  3.  Lopressor 12.5 mg daily.  4.  Cardura 4 mg daily.  5.   Triamterene/hydrochlorothiazide 37.5/25 mg daily.  6.  Nexium 40 mg daily.  7.  Plavix 75 mg daily.  8.  Lithium carbonate 300 mg b.i.d.  9.  Enteric-coated aspirin 81 mg daily.  10. Crestor 10 mg daily.  11. Mobic 7.5 mg b.i.d. p.r.n.   PAST MEDICAL HISTORY/SOCIAL HISTORY/FAMILY HISTORY:  Refer to dictated  admission note July 30, 2004, for full details.   REVIEW OF SYSTEMS:  As noted in HPI.  The remainder of systems negative.   PHYSICAL EXAMINATION:  VITAL SIGNS: Blood pressure 122/70, pulse 61 and  regular, weight 182.  GENERAL: A 75 year old male in no apparent distress.  HEENT: Normocephalic and atraumatic.  NECK: Preserved bilateral carotid pulses.  LUNGS: Clear to auscultation in all fields.  HEART: Regular rate and rhythm (S1 and S2). No significant murmur.  ABDOMEN: Protuberant and nontender.  EXTREMITIES: Preserved distal pulses with no significant pedal edema.  NEUROLOGIC: No focal deficits.   IMPRESSION:  1.  Recurrent symptomatic tachypalpitations.  2.  Coronary artery disease.      1.  Status post recent hospitalization with negative  cardiac markers,          non-ischemic adenosine Myoview.      2.  Status post cardiac catheterization in August 2002: Multivessel          coronary artery disease with 100% saphenous vein graft to posterior          descending artery; atretic left anterior descending graft; patent          but diffusely diseased SVG to first obtuse marginal and second          obtuse marginal graft with patent proximal stent site; patent but          diseased sequential saphenous vein graft to first diagonal and          second diagonal, ejection fraction 45%.      3.  Status post stent, saphenous vein graft to the first obtuse marginal          and second obtuse marginal graft in July 2001.      4.  Six-vessel coronary artery bypass graft in January 1996.  3.  Ischemic cardiomyopathy.      1.  Ejection fraction of 30% to 40% by recent  echocardiogram (28% by          Myoview).  4.  Dyslipidemia.  5.  Hypertension.  6.  Recent shingles.  7.  Headache.      1.  Probably secondary to nitrates.  8.  Peripheral vascular disease.      1.  Status post aortobifemoral bypass graft in 1999.  9.  Gastroesophageal reflux disease/hiatal hernia.   PLAN:  The patient will be admitted directly to [facility] for re-  evaluation of intermittent chest discomfort and, more recently, recurrent  tachypalpitations associated with dizziness. Given his left ventricular  dysfunction noted by both recent perfusion imaging and echocardiography,  these palpitations are recent for possible  nonsustained ventricular tachycardia. Therefore, recommendation is to  schedule the patient for an initial diagnostic cardiac catheterization no  Monday with Dr. Riley Kill and then followed by evaluation by the  electrophysiology team for possible ICD implantation. The patient was seen  and examined with Dr. [name].      GS/MEDQ  D:  08/10/2004  T:  08/10/2004  Job:  [id number]   cc:   Alfonse Alpers. Dagoberto Ligas, M.D.  [address], [address]  [place]  [place] [address]  Fax: [phone]

## 2010-09-28 NOTE — Cardiovascular Report (Signed)
NAMEMAHDI, FRYE                ACCOUNT NO.:  [id number]   MEDICAL RECORD NO.:  [id number]            PATIENT TYPE:   LOCATION:                                 FACILITY:   PHYSICIAN:  [name]. Riley Kill, M.D. [facility] DATE OF BIRTH:   DATE OF PROCEDURE:  08/13/2004  DATE OF DISCHARGE:                              CARDIAC CATHETERIZATION   INDICATIONS:  The patient is a 75 year old gentleman well-known to me.  He  presented with recurrent substernal chest pain, tachy palpitations with  symptomatic arrhythmia, and also chest pain.  The current study was done to  assess coronary anatomy.   PROCEDURE:  1.  Left heart catheterization.  2.  Selective coronary arteriography.  3.  Saphenous vein graft angiography.  4.  Selective left internal mammary angiography.   DESCRIPTION OF PROCEDURE:  The patient was brought to the catheterization  laboratory and prepped and draped in the usual fashion.  Through an anterior  puncture the femoral artery was entered.  6-French catheters were used.  Standard views of the coronary arteries were obtained followed by views of  the saphenous vein grafts.  Internal mammary angiography was performed with  a subselective injection.  The patient tolerated the procedure without  complication and was taken to the holding area in satisfactory clinical  condition for direct manual hemostasis.   HEMODYNAMIC DATA:  1.  Central aortic pressure 126/56, mean 82.  2.  Left ventricular pressure 138/13.  There was not a significant gradient      on pullback across the aortic valve.   ANGIOGRAPHIC DATA:  1.  Ventriculography was done in the RAO projection.  There was a large area      of apical akinesis.  There was trace mitral regurgitation.  Ejection      fraction was calculated 32%.  2.  The left main is free of critical disease.  3.  The left anterior descending artery is calcified and diffusely diseased      throughout its mid section.  There is probably up to 80%  segmental      diffuse narrowing throughout.  There is evidence of competitive flow to      the distal vessel due to the known patency of the internal mammary      artery.  4.  The circumflex demonstrates diffuse 70% narrowing proximally.  This      leads into a small marginal branch and then into an AV circumflex branch      that in effect provides collaterals to the distal right.  The obtuse      marginal branch is totaled.  It is noted the AV circumflex      collateralizes the distal right coronary circulation.  5.  The right coronary artery is severely diseased and then basically      totally occluded with what appears to be recanalized vessel with flow      into the distal right and PDA which are diffusely diseased.  6.  The saphenous vein graft to the right coronary artery is totally  occluded.  7.  The saphenous vein graft to the first and second diagonal branches is      diffusely atheromatous with probably about 50-60% mid narrowing and 40-      50% distal narrowing.  It is diffusely irregular graft, but continues to      remain patent.  8.  The saphenous vein graft to the OM1 also is diffusely irregular.  There      is probably 40-50% diffuse narrowing throughout the whole proximal      portion of the graft.  The first touchdown is without critical      narrowing.  There is a large valve followed by again diffuse      irregularity leading into the second marginal branch which itself has      about an 80% lesion just distally.  This does not appear to be      substantially changed.  9.  The internal mammary artery appears to be patent into what likely is a      diffusely diseased LAD distally.   1.  Moderately severe reduction in overall left ventricular function.  2.  Continued patency of the internal mammary to the diffusely diseased left      anterior descending.  3.  Continued patency of saphenous vein graft to the D1 and D2.  4.  Continued patency of the saphenous  vein graft to the obtuse marginal 1      and 2 with abnormalities of both vein grafts as noted above.  5.  Total occlusion of the right coronary artery with total occlusion of the      saphenous vein graft to the right coronary artery with what appears to      be a recanalized vessel to the distal vessel and collaterals from the AV      circumflex.   DISPOSITION:  At the present time we would recommend continued medical  therapy.  Given his arrhythmia we will need an EP consult.  The patient may  need a permanent pacemaker and/or defibrillator.  I will defer this to Dr.  Ladona Ridgel.      [name]. Riley Kill, M.D. [facility]  Electronically Signed     TDS/MEDQ  D:  09/26/2005  T:  09/26/2005  Job:  [id number]

## 2010-09-28 NOTE — Op Note (Signed)
NAMEBEXTON, HAAK               ACCOUNT NO.:  [id number]   MEDICAL RECORD NO.:  [id number]          PATIENT TYPE:  INP   LOCATION:  6525                         FACILITY:  [facility]   PHYSICIAN:  Doylene Canning. Ladona Ridgel, M.D.  DATE OF BIRTH:  16-Feb-1936   DATE OF PROCEDURE:  08/28/2004  DATE OF DISCHARGE:                                 OPERATIVE REPORT   PROCEDURE PERFORMED:  Invasive electrophysiologic study and RF catheter  ablation of typical atrial flutter.   INTRODUCTION:  The patient is a 75 year old man with recurrent tachy  palpitations and documented SVT. He was admitted to the hospital with chest  pain and shortness of breath. He underwent catheterization. He has three-  vessel coronary disease with atretic LIMA and patent vein grafts to the  posterolateral branch and diagonal branch and to OM's. The patient's EF is  28-32% by echo and LV gram. He was scheduled for electrophysiologic study  and catheter ablation of SVT followed by ICD implantation secondary to his  underlying ischemic cardiomyopathy. However, he presented to the hospital  the day before his procedure with chest pain and shortness of breath and  recurrent SVT which stopped just prior to his being hooked up to the  monitor. He is now referred for electrophysiologic study and catheter  ablation.   PROCEDURE PERFORMED:  After informed consent was obtained, the patient is  taken diagnostic EP lab in fasting state. After usual preparation, draping,  intravenous fentanyl and midazolam was given for sedation. A 6-French  Hexapolar catheter was inserted percutaneously into the right jugular vein  and advanced to the coronary sinus. 5-French quadripolar catheter was  inserted percutaneously in the right femoral vein and advanced to the His  bundle region. A 5-French quadripolar catheter was inserted percutaneously  in right femoral vein and advanced to RV apex. After measurement of baseline  intervals, rapid ventricular  pacing was carried out from the RV apex and  stepwise decreased down to 440 milliseconds, demonstrating VA Wenckebach.  During rapid ventricular pacing the atrial activation was midline and  decremental. Next programmed ventricular stimulation was carried out from  the RV apex at basic drive cycle length of 914 milliseconds. The S1/S2  interval was stepwise decreased down to 390 milliseconds where retrograde AV  node ERP was observed. During programmed ventricular stimulation, the atrial  activation sequence was midline and decremental. Next programmed atrial  stimulation was carried out from the coronary sinus at a basic drive cycle  length of 782 milliseconds. The S1/S2 interval was stepwise decreased from  440 milliseconds down to 270 milliseconds where AV node ERP was observed.  During programmed atrial stimulation there was no inducible SVT, no AH jumps  and no echo beats. Next rapid atrial pacing was carried out from the  coronary sinus at a basic drive cycle length of 956 milliseconds and  stepwise decreased down to 310 milliseconds where AV Wenckebach was  observed. During rapid atrial pacing the PR interval was less than the RR  interval and there was no inducible SVT. Additional decrements in the atrial  cycle length were then  carried out down to 210 milliseconds resulting in the  initiation of atrial flutter. At this point the 20 pole halo catheter was  inserted into the right atrium. Mapping was carried out. This demonstrated  typical counterclockwise atrial flutter with a cycle length of 245  milliseconds. At this point the 7-French quadripolar ablation catheter was  advanced into the right atrium and additional mapping carried out. The  atrial flutter isthmus was typically in size and orientation. A total of  four RF energy applications were then delivered the atrial flutter isthmus.  During the first RF energy application atrial flutter was terminated and  sinus rhythm  restored. During the second RF energy application bidirectional  block in the atrial flutter isthmus was observed. Two bonus RF energy  applications were then delivered. The patient was observed for approximately  15 minutes and had no recurrent atrial flutter isthmus conduction. During  this time, rapid ventricular pacing was again carried out from the RV apex  and rapid atrial pacing was carried out from the high right atrium. There  was no inducible arrhythmias. The catheters were then removed. Hemostasis  was assured, the patient was prepped for ICD implantation.   COMPLICATIONS:  There were no immediate procedure complications.   RESULTS:  1.  Baseline ECG. The baseline ECG demonstrates sinus rhythm with normal      axis and intervals.  2.  Baseline intervals. Sinus node cycle length 934 milliseconds. The PR      interval 153 milliseconds, QRS duration 108 milliseconds, the HV      interval 41 milliseconds.  3.  Rapid ventricular pacing. Rapid ventricular pacing was carried out from      the RV apex and stepwise decreased down to 440 milliseconds where VA      Wenckebach was observed. During rapid ventricular pacing the atrial      activation sequence was midline and decremental.  4.  Programmed ventricular stimulation. Programmed ventricular stimulation      was carried out from the RV apex at basic drive cycle length of 045      milliseconds. The S1/S2 interval was stepwise decreased from 440      milliseconds down to 390 milliseconds where the retrograde AV node ERP      was observed. During programmed ventricular stimulation the atrial      activation sequence was midline and decremental.  5.  The programmed atrial stimulation. Programmed atrial stimulation was      carried out from the coronary sinus as well as from the high right      atrium at basic drive cycle length of 409 milliseconds. The S1/S2     interval was stepwise decreased from 440 milliseconds down to 270       milliseconds with AV node ERP was observed. During programmed atrial      stimulation there no AH jumps and no echo beats. There is no inducible      SVT.  6.  Rapid atrial pacing. Rapid atrial pacing was carried out from the      coronary sinus as well as the high right atrium with pacing cycle length      of 600 milliseconds stepwise decreased down to 310 milliseconds where AV      Wenckebach was observed. During rapid atrial pacing and at 310      milliseconds there was no inducible SVT. At this point additional      decrements were carried out. During rapid atrial pacing from  the      coronary sinus down to 210 milliseconds resulting in the initiation of      atrial flutter.   ARRHYTHMIAS OBSERVED:  Atrial flutter initiation rapid atrial pacing,  duration was sustained, termination was with catheter ablation, cycle length  was 245 milliseconds.   MAPPING:  Mapping of the patient's atrial flutter isthmus demonstrated  typical size and orientation of the atrial flutter.   RF ENERGY APPLICATION:  Total of four RF energy applications were delivered  to the usual atrial flutter isthmus resulting in termination of atrial  flutter, restoration of sinus rhythm, creation of bidirectional block in the  usual atrial flutter isthmus.   CONCLUSIONS:  Study demonstrates successful electrophysiologic study and RF  catheter ablation of typical atrial flutter with a total of four RF energy  applications delivered to usual atrial flutter isthmus. There are no initial  immediate procedure complications. The patient will then be referred for ICD  implantation which will be dictated on separate note.      GWT/MEDQ  D:  08/28/2004  T:  08/29/2004  Job:  [id number]   cc:   [name]. Riley Kill, M.D. [facility]   Alfonse Alpers. Dagoberto Ligas, M.D.  [address], [address]  [place]  [place] [address]  Fax: [phone]

## 2010-09-28 NOTE — H&P (Signed)
Roberto [name], Roberto [name]               ACCOUNT NO.:  [id number]   MEDICAL RECORD NO.:  [id number]          PATIENT TYPE:  INP   LOCATION:  1829                         FACILITY:  [facility]   PHYSICIAN:  Creta Levin, M.D. LHCDATE OF BIRTH:  05-Feb-1936   DATE OF ADMISSION:  08/27/2004  DATE OF DISCHARGE:                                HISTORY & PHYSICAL   PRIMARY CARDIOLOGIST:  [name]. Riley Kill, M.D.   PRIMARY CARE PHYSICIAN:  Alfonse Alpers. Dagoberto Ligas, M.D.  He also sees [name] in EP.   CHIEF COMPLAINT:  Tachypalpitations.   HISTORY:  Mr. Ladnier is a 75 year old male with tachycardia/bradycardia  syndrome (beta blocker intolerance), coronary artery disease with ischemic  cardiomyopathy, who complains of palpitations and heart rates in the 150s.  He has been in the midst of an extensive workup for this  tachycardia/bradycardia syndrome.  He has recently undergone a stress test,  a heart catheterization, and is scheduled for ICD implantation tomorrow.  Please see the discharge summary from August 14, 2004, for further details.   PAST MEDICAL HISTORY:  1.  Coronary artery disease.      1.  CABG in 1996 with LIMA to LAD, vein to RCA, vein to D1 and D2, vein          to M1 and M2.      2.  Recent negative Myoview.      3.  Cardiac catheterization in August 2002 revealed that the LIMA was          atretic, vein graft to posterolateral was 100%, vein graft to the          diagonals were 70% x2, vein grafts to the obtuse marginals were          patent but diffusely diseased.      4.  A recent catheterization (unknown date) showed a patent LIMA to LAD,          vein graft to D1 and D2, vein graft to M1 and M2; 100% occlusion of          the vein graft to the RCA; left circumflex 70% proximal; OM 100%; OM-          2 80%.  The actual catheterization report is not available on the          electronic medical record.  2.  Tachy/brady syndrome.  3  Hypertension.  1.   Hyperlipidemia.  2.  Peripheral vascular disease, status post aorto-bifemoral.  3.  Probable COPD.  4.  Diverticulosis.  5.  Zoster.  6.  Artificial eardrums.   ALLERGIES:  MORPHINE.   MEDICATIONS:  1.  Imdur 15 mg daily.  2.  Zyrtec 10 mg daily.  3.  Cardura 2 mg daily.  4.  Vasotec 2.5 mg b.i.d.  5.  Triamterine/HCTZ 37.5/25 mg daily.  6.  Nexium 40 mg daily.  7.  Plavix 75 mg daily.  8.  Aspirin 81 mg daily.  9.  Crestor 10 mg daily.  10. Mobic 75 mg daily.  11. Lithium 300 mg b.i.d. for headache.  SOCIAL HISTORY:  He lives in [place], [place], with his wife.  He  is a retired Retail banker.  He has a 25 pack-year smoking history but  quit one year ago.  He does not follow a specific diet.  He does not drink  alcohol.   FAMILY HISTORY:  His mother died at age 33 and had multiple strokes.  Father  died of lung cancer at age 49 and had coronary disease.  One brother had  lung cancer and died.  One sister had liver cancer and died.  One sister had  a cerebral aneurysm and another sister had coronary disease.   REVIEW OF SYSTEMS:  Positive for occasional headaches, now resolved since  being treated with lithium, chronic dyspnea on exertion, chronic arthralgia  of the knees and hips, gastroesophageal reflux.  The remainder of his 10-  point review is negative except for the HPI.   PHYSICAL EXAMINATION:  VITAL SIGNS:  Temperature 98.3, blood pressure 99/60,  heart rate 57, saturating 98% on room air.  GENERAL:  He is in no acute distress.  HEENT:  Unremarkable.  He has partial dentures.  NECK:  JVP normal, carotid upstroke normal, no bruits, no thyromegaly.  CHEST:  Lungs are clear.  CARDIOVASCULAR:  Quiet heart sounds, normal S1, normal S1, no gallop, no  murmur, no rub.  ABDOMEN:  Soft, nondistended, nontender without masses or bruits.  EXTREMITIES:  No cyanosis, clubbing or edema.  NEUROLOGIC:  Alert and oriented x3, cranial nerves II-XII are intact  with  the exception of some hearing loss, nonfocal.   Chest x-ray and labs are pending.  The ECG showed sinus bradycardia at a  rate of 50, anteroseptal MI age indeterminate.  There were lateral T-wave  abnormalities, consider ischemia. This is unchanged from August 11, 2004.   ASSESSMENT AND PLAN:  Tachycardia/bradycardia syndrome.  Given that his beta  blocker intolerance precludes outpatient management, he will be brought and  monitored on telemetry since his implantable cardioverter-defibrillator can  be implanted.  Otherwise his medical regimen will consist of his home  medicines.      RPK/MEDQ  D:  08/27/2004  T:  08/27/2004  Job:  [id number]

## 2010-09-28 NOTE — Assessment & Plan Note (Signed)
[facility]                            CARDIOLOGY OFFICE NOTE   NAME:STEPHENSZachory, Mangual                      MRN:          [id number]  DATE:07/16/2006                            DOB:          19-May-1935    HISTORY OF PRESENT ILLNESS:  Mr. Gosch is in for followup.  In  general, he has been stable.  He denies any syncope or presyncope.  He  has had follow up in [facility].  He denies any ongoing chest pain.  He does get modestly short of breath with exertion.   PHYSICAL EXAMINATION:  VITAL SIGNS:  Blood pressure 120/64, pulse 65.  LUNGS:  Fields revealed decreased breath sounds bilaterally.  CARDIAC:  Rhythm is regular.   STUDIES:  Electrocardiogram demonstrates fairly marked sinus  bradycardia.  There is atrial premature beats.   IMPRESSION:  1. Coronary artery disease with coronary artery bypass graft disease      requiring percutaneous coronary intervention.  2. Ischemic cardiomyopathy.  3. Status post implantable cardiac defibrillator.  4. 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  5. History of hypercholesterolemia.   PLAN:  1. Return to clinic in six months.  2. Continue current medical regimen.  3. Continue follow up with Dr. Dagoberto Ligas.     [name]. Riley Kill, MD, [facility]  Electronically Signed    TDS/MedQ  DD: 07/16/2006  DT: 07/16/2006  Job #: [id number]

## 2010-09-28 NOTE — Op Note (Signed)
Roberto [name], Roberto [name]               ACCOUNT NO.:  [id number]   MEDICAL RECORD NO.:  [id number]          PATIENT TYPE:  INP   LOCATION:  6525                         FACILITY:  [facility]   PHYSICIAN:  Doylene Canning. Ladona Ridgel, M.D.  DATE OF BIRTH:  11-09-1935   DATE OF PROCEDURE:  08/28/2004  DATE OF DISCHARGE:                                 OPERATIVE REPORT   PROCEDURE PERFORMED:  Implantation of single-chamber implantable  cardioverter-defibrillator.   SURGEON:  Doylene Canning. Ladona Ridgel, M.D.   INDICATION:  Ischemic cardiomyopathy, status post MI in the distant past,  class II heart failure, ejection fraction 28%.   I. INTRODUCTION:  The patient is a 68-year man who was admitted to the  hospital most recently with chest pain and shortness of breath, and found to  have SVT.  He underwent catheterization demonstrating patent grafts and an  EF of 28%, severe native three-vessel disease.  The patient was scheduled to  return for electrophysiologic study and catheter ablation of SVT, but he  developed recurrent chest pain and was admitted prematurely.  He ruled out  for MI.  His SVT was associated with his chest pain and stopped at that time  of his admission with no documented EKG with his most recent admission.  He  subsequently underwent electrophysiologic study and catheter ablation, and  was found to have easily inducible sustained atrial flutter which was  successfully ablated.  He is now referred for ICD implantation.   II. PROCEDURE:  After informed consent was obtained, the patient was taken  to the diagnostic EP lab in the fasting state.  After the usual preparation  and draping, intravenous fentanyl and midazolam were given for sedation.  Thirty milliliters of lidocaine were infiltrated into the left  infraclavicular region.  A 9-cm incision was carried out over this region  and electrocautery utilized to dissect down to the fascial plane.  Ten  milliliters of contrast were injected in the left  upper extremity venous  system, demonstrating a patent left subclavian vein.  The left subclavian  was then punctured and the Medtronic model 6949 65-cm active-fixation  defibrillation lead, serial number [id number], was advanced by way of the  left subclavian vein into the right ventricle.  Mapping was carried out in  the right ventricle and at the final site, the R-waves measured 14 mV  through the analyzer and with the lead actively affixed, the pacing  impedance was 746 ohms.  The pacing threshold was 0.8 volts at 0.5  milliseconds.  Ten-volt pacing did not stimulate the diaphragm.  With the  lead in satisfactory position, it was secured to the subpectoralis fascia  with a figure-of-eight silk suture.  In addition, the sew-in sleeve was  secured with silk suture.  Electrocautery was then utilized to make  subcutaneous pocket.  Kanamycin irrigation was utilized to irrigate the  pocket and electrocautery utilized to assure hemostasis.  The Medtronic  Maximo model 7232 single-chamber defibrillator, serial number [id number],  was connected to the defibrillation lead and placed in the subcutaneous  pocket.  The generator was secured with a silk suture.  Additional kanamycin  was utilized to irrigate the pocket and defibrillation threshold testing was  then carried out.   After the patient was more deeply sedated with fentanyl and Versed, VF was  induced with T-wave shock.  A 15-joule shock was delivered which terminated  ventricular fibrillation and restored sinus rhythm.  Five minutes were  allowed to elapse and second DFT test carried out.  Again, VF was induced  with a T-wave shock.  A 10-joule shock was then delivered, which also  terminated ventricular fibrillation and restored sinus rhythm.  No  additional defibrillation threshold testing was carried out and the incision  was closed with a layer of 2-0 Vicryl followed by layer of 3-0 Vicryl,  followed by a layer of 4-0 Vicryl.   Benzoin was painted on the skin, Steri-  Strips were applied, a pressure dressing was placed and the patient was  returned to his room in satisfactory condition.   III. COMPLICATIONS:  There are no immediate procedure complications.   IV. RESULTS:  This demonstrates successful implantation of a Medtronic  single-chamber defibrillator in a patient with an ischemic cardiomyopathy  and ejection fraction of 28%, status post myocardial infarction.      GWT/MEDQ  D:  08/28/2004  T:  08/29/2004  Job:  [id number]   cc:   Alfonse Alpers. Dagoberto Ligas, M.D.  [address]  [place]  [place] [address]  Fax: [phone]   [name]. Riley Kill, M.D. [facility]

## 2010-09-28 NOTE — Cardiovascular Report (Signed)
[place]  Patient:    Roberto [name], Roberto [name]                      MRN: [id number] Proc. Date: 11/12/99 Adm. Date:  [id number] Attending:  Junious Silk CC:         [name]. Riley Kill, M.D. [facility]             Charles G. Dagoberto Ligas, M.D.             Cardiovascular Laboratory                        Cardiac Catheterization  INDICATIONS:  [name] is a 75 year old, well known to [place].  He presented with a non-Q-wave infarction.  He has had prior revascularization surgery in 1995.  He now presents with positive enzymes.  He was brought to the lab for further evaluation.  PROCEDURES: 1. Left heart catheterization. 2. Selective coronary angiography. 3. Selective left ventriculography. 4. Saphenous vein graft angiography x 3. 5. Selective left internal mammary angiography. 6. Aortic root aortography. 7. PTCA and stenting of the saphenous vein graft to the OM1 and OM2.  DESCRIPTION OF PROCEDURE:  The patient was brought to the catheterization lab and prepped and draped in the usual fashion.  Through an anterior puncture, the  right femoral artery was easily entered.  Views of the left and right coronaries were obtained in multiple angiographic projections.  All  three vein grafts were then injected.  We tried to engage the left subclavian, but this proved to be difficult.  We, therefore, performed ventriculography and followed with an arch aortogram.  Following this, we were able to identify the ostium of the subclavian, and the subclavian was engaged with a guide wire as well as a RCA catheter.  We were not able to selectively engage the internal mammary, but were able to see some of the internal mammary relatively well.  We then reviewed the options.  The patient had multiple graft abnormalities, the most severe currently of which involved the saphenous vein graft to the OM1 and OM2; with a subtotal occlusion that was hazy.  This likely represented  the acute lesion.  As we were standing there reviewing the films, the patient developed severe chest pain (6 out of 10), that required sublingual nitroglycerin and IV Lopressor for relief.  We elected to go ahead with the procedure.  He was randomized to the X-Tract saphenous vein graft trial.  He was randomized to standard therapy.  Heparin and Integrilin were given according to protocol.  An ACT was obtained.  The lesion was crossed with a JR4 guiding catheter (7-French), and a 0.014 Hi-Torque Floppy wire; directly stented using a 3.0 x 13 mm length Guidant Tetra stent.  There was a single inflation to 12 atm, and marked improvement in the appearance of the artery.  There was no clinical downstream embolization, and the patient tolerated the procedure well.  He was taken to the holding area in satisfactory clinical condition after removal of all catheters.  HEMODYNAMIC DATA: 1. Central aortic pressure:  119/74. 2. Left ventricular pressure:  126/12. No gradient on pullback across the aortic valve.  ANGIOGRAPHIC DATA: 1. Left Main Coronary Artery:  Mildly calcified, but free of critical disease. 2. Left Anterior Descending Artery:  Had some diffuse calcification and    moderate segmental plaquing proximally.  There was 70-80% disease of the    LAD beyond  this point, then there was evidence of competitive filling    distally. 3. Circumflex Artery:  The native circumflex has diffuse 70-80% proximal    stenosis, leading into a marginal.  The marginal has a diffusely diseased    sub branch and a totally occluded branch.  The distal circumflex is    diffusely diseased, with 90% narrowing over a long area; leading into    likely a bifurcating third marginal branch. 4. Right Coronary Artery:  The native right coronary artery is diffusely    diseased and totally occluded beyond the right ventricular branch.    There are collaterals and slow antegrade filling of the PDA.  There is    also  flow to the distal right coronary artery from the septal perforating    vessels, and also possibly from the distal LAD; although this was not    well seen. 5. GRAFTS:    a. The internal mammary to the distal LAD was hard to visualize, but       appeared to be patent all the way down to the LAD.    b. The saphenous vein graft to the OM1 and OM2 is intact.  However,       there is diffuse ectasia.  In the junction of the proximal and mid       graft there is a 95% stenosis, which was stented to 0%.  Beyond this       there was multiple, diffuse luminal irregularities related to the       graft.  However, the graft does insert into the OM1 and OM2, just prior       to its insertion into the OM2 there is about 50% narrowing.  There       were multiple 50% areas of disease throughout the distal vein graft.    c. The saphenous vein graft to the two diagonals are also intact.  There is       about a 50% area of proximal narrowing, followed by a 50-70% area of       diffuse segmental plaquing of the mid stent.  The distal stent has       luminal irregularities, but inserts into two diagonal branches and was       widely patent.  VENTRICULOGRAPHY:  Done in the RAO projection, reveals akinesis with mid and distal anterolateral wall and apex; as well as distal inferior wall.  This is the LAD territory from the previous anterior infarction.  Ejection fraction would be estimated at 40%.  ARCH AORTOGRAM:  Done to highlight the origin of the subclavian.  We could see the origin of the brachiocephalic trunk; which appeared to be patent without significant stenosis.  Likewise, the right carotid appeared to be patent, at least in the proximal portion.  There was some calcification at the origin of the subclavian, but beyond this the subclavian was patent.  There was some luminal irregularities with the subclavian.  CONCLUSIONS: 1. Moderate reduction and global left ventricular function, with    wall  motion abnormality involving the anterior wall -- old. 2. Patent internal mammary to the distal LAD.  3. High-grade stenosis of the saphenous vein graft to the OM1 and OM2,    with successful percutaneous stenting.  Also with diffuse, multiple    50% lesions in multiple locations. 4. Moderate stenosis and diffuse disease of the saphenous vein graft    sequentially to the first and second diagonals. 5. Occlusion of the  saphenous vein graft to the distal right coronary    circulation; with multiple areas of collaterals to the distal right.  DISPOSITION:  We plan to review the films with Dr. Laneta Simmers.  The obtuse marginal and the diagonals are in trouble; however, the internal mammary is intact.  In the distal right coronary circulation there is collateralization. I will get back to Dr. Laneta Simmers to see the patient in consultation. DD:  11/12/99 TD:  11/13/99 Job: [id number]

## 2010-09-28 NOTE — Discharge Summary (Signed)
Roberto [name], Roberto [name]               ACCOUNT NO.:  [id number]   MEDICAL RECORD NO.:  [id number]          PATIENT TYPE:  INP   LOCATION:  2038                         FACILITY:  [facility]   PHYSICIAN:  [name]. Riley Kill, M.D. [facility] OF BIRTH:  Aug 17, 1935   DATE OF ADMISSION:  08/10/2004  DATE OF DISCHARGE:  08/13/2004                                 DISCHARGE SUMMARY   PROCEDURES:  1.  Cardiac catheterization.  2.  Coronary arteriogram.  3.  Left ventriculogram.  4.  IMA arteriogram.  5.  SVG angiogram.   DISCHARGE DIAGNOSES:  1.  Tachy palpitations, supraventricular tachycardia on the monitor,      symptomatic.  2.  History of chest discomfort, Cardiolite negative with an ejection      fraction of 28%, status post cardiac catheterization with severe native      three-vessel disease, internal mammary artery to left anterior      descending patent, right coronary artery totaled, saphenous vein graft      to right coronary artery totaled, saphenous vein graft to diagonal 1 and      diagonal 2 patent but diffusely diseased, saphenous vein graft to obtuse      marginal 1 and obtuse marginal 2 with diffuse irregularities.  Obtuse      marginal 2 with an 80% stenosis at the insertion and an ejection      fraction of 32%.  3.  Status post aortocoronary bypass surgery in 1996, with internal mammary      artery to left anterior descending, saphenous vein graft to right      coronary artery, saphenous vein graft to diagonal 1 and diagonal 2 and      saphenous vein graft to obtuse marginal 1 and obtuse marginal 2.  4.  Bradycardia, with the heart rate in the 40s on low-dose metoprolol.  5.  Hyperlipidemia.  6.  Hypertension.  7.  Peripheral vascular disease, status aortobifemoral in 1999.  8.  Probable chronic obstructive pulmonary disease.  9.  Gastroesophageal reflux disease/hiatal hernia.  10. Diverticulosis.  11. Status post colectomy in 1995.  12. Benign prostatic hypertrophy.  13.  Myocardial infarction in January 1996.  14. Myocardial infarction in 2001, with a stent of the saphenous vein graft      to obtuse marginal 1 and obtuse marginal 2.  15. Family history of cancer and coronary artery disease.  16. A 25-pack-year history of tobacco, quit a year ago.  17. Herpes zoster, possible postherpetic neuralgia.  18. Hypokalemia.   HOSPITAL COURSE:  Roberto [name] is a 75 year old male with a medical history  as described above.  He was having tachy palpitations and was admitted to  the hospital.   His beta blocker was increased from metoprolol 12.5 mg q.d. to metoprolol  12.5 mg b.i.d.  However, he did not tolerate this well with the heart rate  dropping into the 30s at times.  His heart rate generally was in the 50s.   Roberto [name] had tachy palpitations in the hospital and was found to have  episodes of SVT.  He was seen  by EP, and it was felt that cardiac  catheterization was indicated, and then ICD, which could be done as an  outpatient.   The cardiac catheterization showed patent IMA to LAD.  SVG to diagonal 1 and  diagonal 2 and SVG to OM1 and OM2 were patent.  SVG to RCA was occluded.  RCA was occluded.  Circumflex was 70% stenosed proximal, and the OM was  totaled.  The OM2 had 80% disease at the insertion with EF of 32%.   On August 14, 2004, Roberto [name] had a low potassium of 3.3, and this was  supplemented.  He was seen by Dr. Riley Kill, and his groin was stable.  He was  ambulating without chest pain or shortness of breath.  He was considered  stable for discharge on August 14, 2004, and is to follow up as an outpatient  for EP study and possible pacemaker/ICD.   DISCHARGE INSTRUCTIONS:  1.  His activity level is to include no strenuous activity.  2.  He is to stick to a low-fat-and-salt diet.  3.  He is to call our office with problems with the cath site.  4.  He is to get a BMET at his next office visit.  5.  He is to follow up with Dr. Riley Kill on May  4 at 10:30 and with Dr.      Dagoberto Ligas and Dr. Ladona Ridgel as scheduled.   DISCHARGE MEDICATIONS:  1.  Imdur 30 mg 1/2 tab q.d.  2.  Zyrtec 10 mg q.d.  3.  Lopressor is on hold.  4.  Cardura is decreased from 4 mg to 2 mg q.d.  5.  Vasotec 2.5 mg b.i.d.  6.  Triamterene/HCTZ 37.5/25 mg q.d.  7.  Nexium 40 mg q.d.  8.  Plavix 75 mg q.d.  9.  Lithium 300 mg b.i.d.  10. Aspirin 81 mg q.d.  11. Crestor 10 mg q.d.  12. Mobic 75 mg p.r.n.      RB/MEDQ  D:  08/14/2004  T:  08/14/2004  Job:  [id number]   cc:   Doylene Canning. Ladona Ridgel, M.D.   Alfonse Alpers. Dagoberto Ligas, M.D.  [address]  [place]  [place] [address]  Fax: [phone]   [name]. Riley Kill, M.D. [facility]

## 2010-10-02 ENCOUNTER — Ambulatory Visit
Admission: RE | Admit: 2010-10-02 | Discharge: 2010-10-02 | Disposition: A | Discharge status: Home / Self Care | Payer: Medicare Other | Source: Ambulatory Visit | Attending: Vascular Surgery | Admitting: Vascular Surgery

## 2010-10-02 ENCOUNTER — Ambulatory Visit (INDEPENDENT_AMBULATORY_CARE_PROVIDER_SITE_OTHER): Payer: Medicare Other | Admitting: Vascular Surgery

## 2010-10-02 DIAGNOSIS — I712 Thoracic aortic aneurysm, without rupture: Secondary | ICD-10-CM

## 2010-10-02 DIAGNOSIS — I716 Thoracoabdominal aortic aneurysm, without rupture, unspecified: Secondary | ICD-10-CM

## 2010-10-02 DIAGNOSIS — R978 Other abnormal tumor markers: Secondary | ICD-10-CM

## 2010-10-02 MED ORDER — IOHEXOL 350 MG/ML SOLN
100.0000 mL | Freq: Once | INTRAVENOUS | Status: AC | PRN
  Administered 2010-10-02: 100 mL via INTRAVENOUS

## 2010-10-02 NOTE — Assessment & Plan Note (Signed)
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  He underwent a CT angio today, and I have reviewed this and discussed it with patient and his wife.  He does have ectasia and aneurysmal change in his aorta from his arch down to the old infrarenal aortofemoral graft.  The most enlarged segment is at the level of his diaphragm.  Six months ago, this was at 6.01 and today is at 6.2 cm.  He has no symptoms referable to this.  He continues to have severe cardiac decompensation and reports that he was admitted since my last visit with him with unstable angina.  He is known to have a markedly reduced ejection fraction.  His most recent problem has been urinary retention and urinary tract infection, for which he is being seen by Dr. Larey Dresser.  I again discussed the significance of this with patient and his wife.  I explained that he is at extremely high risk for any attempt at open repair of thoracoabdominal aneurysm and certainly would not recommend any surgery unless he was symptomatic with imminent rupture or showed marked increase in size.  He understands this.  We will see him again in 1 year with repeat follow-up.  His physical exam remains unchanged.  He does have palpable femoral and radial pulses bilaterally.  His abdomen is nontender with no palpable aneurysm present.    Larina Earthly, M.D. Electronically Signed  TFE/MEDQ  D:  10/02/2010  T:  10/02/2010  Job:  [id number]  cc:   [name]. Riley Kill, MD, [facility] Maretta Bees. Vonita Moss, M.D. Jeoffrey Massed, MD

## 2010-10-05 ENCOUNTER — Telehealth: Payer: Self-pay | Admitting: Cardiology

## 2010-10-05 NOTE — Telephone Encounter (Signed)
Pt wife has question and concern re pt meds and wants to talk to dr Riley Kill nurse.

## 2010-10-05 NOTE — Telephone Encounter (Addendum)
I spoke with the pt's wife and she made me aware that the pt has not been able to urinate for the past two weeks. The pt has been seeing Dr Vonita Moss and they put in a foley catheter to drain urine.  The pt has also gone into the ER two times and had 1000cc of urine drained. The pt was started on CIPRO this past Tuesday by Dr Vonita Moss due to infection.  The pt has not had a follow-up INR since being on Cipro.  Yesterday the pt started to notice blood with urination.  The pt was seen by Dr Vonita Moss today and had his bladder irrigated and this produced old blood clots and fresh blood.  Dr Vonita Moss instructed the pt to hold his coumadin and gave the pt a dose of Vitamin K.  The pt is scheduled to see Dr Vonita Moss again on 10/09/10.  I told the pt's wife that based on Dr Enos Fling instructions at the 10/09/10 appt the pt would need to contact the coumadin clinic to discuss having an INR drawn.  She agreed with plan and will follow-up next week.  I will forward this information to Dr Riley Kill so that he is aware the pt is off his coumadin at this time.   This was noted.   Ermalene Postin

## 2010-10-06 ENCOUNTER — Emergency Department (HOSPITAL_COMMUNITY)
Admission: EM | Admit: 2010-10-06 | Discharge: 2010-10-06 | Disposition: A | Discharge status: Home / Self Care | Payer: Medicare Other | Attending: Emergency Medicine | Admitting: Emergency Medicine

## 2010-10-06 DIAGNOSIS — E119 Type 2 diabetes mellitus without complications: Secondary | ICD-10-CM | POA: Insufficient documentation

## 2010-10-06 DIAGNOSIS — I25810 Atherosclerosis of coronary artery bypass graft(s) without angina pectoris: Secondary | ICD-10-CM | POA: Insufficient documentation

## 2010-10-06 DIAGNOSIS — I10 Essential (primary) hypertension: Secondary | ICD-10-CM | POA: Insufficient documentation

## 2010-10-06 DIAGNOSIS — Z7901 Long term (current) use of anticoagulants: Secondary | ICD-10-CM | POA: Insufficient documentation

## 2010-10-06 DIAGNOSIS — R339 Retention of urine, unspecified: Secondary | ICD-10-CM | POA: Insufficient documentation

## 2010-10-06 DIAGNOSIS — Z79899 Other long term (current) drug therapy: Secondary | ICD-10-CM | POA: Insufficient documentation

## 2010-10-06 DIAGNOSIS — Z8546 Personal history of malignant neoplasm of prostate: Secondary | ICD-10-CM | POA: Insufficient documentation

## 2010-10-06 DIAGNOSIS — R319 Hematuria, unspecified: Secondary | ICD-10-CM | POA: Insufficient documentation

## 2010-10-06 LAB — DIFFERENTIAL
Basophils Absolute: 0 10*3/uL (ref 0.0–0.1)
Basophils Relative: 0 % (ref 0–1)
Eosinophils Relative: 3 % (ref 0–5)
Lymphocytes Relative: 11 % — ABNORMAL LOW (ref 12–46)
Monocytes Absolute: 0.5 10*3/uL (ref 0.1–1.0)

## 2010-10-06 LAB — CBC
HCT: 33 % — ABNORMAL LOW (ref 39.0–52.0)
MCHC: 31.5 g/dL (ref 30.0–36.0)
RDW: 13.9 % (ref 11.5–15.5)

## 2010-10-06 LAB — BASIC METABOLIC PANEL
Calcium: 8.9 mg/dL (ref 8.4–10.5)
GFR calc non Af Amer: >60 mL/min (ref >60)
Glucose, Bld: 128 mg/dL — ABNORMAL HIGH (ref 70–99)
Sodium: 137 mEq/L (ref 135–145)

## 2010-10-08 ENCOUNTER — Emergency Department (HOSPITAL_COMMUNITY)
Admission: EM | Admit: 2010-10-08 | Discharge: 2010-10-08 | Disposition: A | Discharge status: Home / Self Care | Payer: Medicare Other | Attending: Emergency Medicine | Admitting: Emergency Medicine

## 2010-10-08 DIAGNOSIS — C61 Malignant neoplasm of prostate: Secondary | ICD-10-CM | POA: Insufficient documentation

## 2010-10-08 DIAGNOSIS — E119 Type 2 diabetes mellitus without complications: Secondary | ICD-10-CM | POA: Insufficient documentation

## 2010-10-08 DIAGNOSIS — Y846 Urinary catheterization as the cause of abnormal reaction of the patient, or of later complication, without mention of misadventure at the time of the procedure: Secondary | ICD-10-CM | POA: Insufficient documentation

## 2010-10-08 DIAGNOSIS — I4891 Unspecified atrial fibrillation: Secondary | ICD-10-CM | POA: Insufficient documentation

## 2010-10-08 DIAGNOSIS — I10 Essential (primary) hypertension: Secondary | ICD-10-CM | POA: Insufficient documentation

## 2010-10-08 DIAGNOSIS — Z951 Presence of aortocoronary bypass graft: Secondary | ICD-10-CM | POA: Insufficient documentation

## 2010-10-08 DIAGNOSIS — F319 Bipolar disorder, unspecified: Secondary | ICD-10-CM | POA: Insufficient documentation

## 2010-10-08 DIAGNOSIS — T83091A Other mechanical complication of indwelling urethral catheter, initial encounter: Secondary | ICD-10-CM | POA: Insufficient documentation

## 2010-10-08 DIAGNOSIS — I2510 Atherosclerotic heart disease of native coronary artery without angina pectoris: Secondary | ICD-10-CM | POA: Insufficient documentation

## 2010-10-08 DIAGNOSIS — J189 Pneumonia, unspecified organism: Secondary | ICD-10-CM | POA: Insufficient documentation

## 2010-10-08 LAB — PROTIME-INR
INR: 1.62 — ABNORMAL HIGH (ref 0.00–1.49)
Prothrombin Time: 19.4 seconds — ABNORMAL HIGH (ref 11.6–15.2)

## 2010-10-09 ENCOUNTER — Encounter: Payer: Self-pay | Admitting: Internal Medicine

## 2010-10-11 ENCOUNTER — Ambulatory Visit: Payer: Medicare Other | Admitting: Cardiology

## 2010-10-11 NOTE — H&P (Signed)
Roberto [name], Roberto [name]               ACCOUNT NO.:  [id number]  MEDICAL RECORD NO.:  [id number]           PATIENT TYPE:  I  LOCATION:  2019                         FACILITY:  [facility]  PHYSICIAN:  [name]. Riley Kill, MD, FACCDATE OF BIRTH:  1935/05/27  DATE OF ADMISSION:  08/23/2010 DATE OF DISCHARGE:                             HISTORY & PHYSICAL   PRIMARY CARE PHYSICIAN:  Dr. Marvel Plan in [place].  PRIMARY CARDIOLOGIST:  [name]. Riley Kill, MD, [facility].  ELECTROPHYSIOLOGIST:  Doylene Canning. Ladona Ridgel, MD  CHIEF COMPLAINT:  Chest pain.  HISTORY OF PRESENT ILLNESS:  [name] is a 75 year old male with a history of coronary artery disease.  He was in his usual state of health this a.m.Marland Kitchen  After he had breakfast, he had onset of substernal chest pain.  It was a 6/10.  It does not radiate.  It was not associated with shortness of breath, nausea, vomiting or diaphoresis.  He tried Tums, but they were no help.  He drinks milk with partial relief of his pain and then coke, also with partial relief of pain.  After the 2 liquids, his chest pain was down to a 3/10.  His wife gave him a nitroglycerin and then his chest pain completely resolved.  It has not returned.  He has no recent history of exertional chest pain.  His chest pain started about 7:30 and duration was approximately 2 hours.  The patient has done some things around the house and yard recently, but generally does not exert himself very much.  He feels his respiratory status was at baseline.  He has a history of reflux, but his reflux symptoms has not been bothering him much either.  He has no recent other illnesses, fevers, or chills.  Currently, he is resting comfortably.  PAST MEDICAL HISTORY: 1. Status post aortocoronary bypass surgery in 1995 with a LIMA to     LAD, SVG to OM1 and OM2, SVG to PDA 2. Non-ST segment elevation MI in 2001 with a stent to the SVG to OM1     and OM2. 3. Status post cardiac catheterization last in June  2011 with a left     main patent, LAD 40%, LIMA to LAD patent, circumflex 70%, OM1     totalled, OM2 90%, PL 95%, RCA totalled, SVG to RCA occluded, SVG     to OM1 and OM-2 occluded which was new.  The SVG to diagonal had     40% distal stenosis. 4. Ischemic cardiomyopathy with an EF of 20-25% by echocardiogram in     June 2011, wall motion abnormalities noted, grade 1 diastolic     dysfunction noted, mild RV systolic dysfunction noted. 5. History of atrial flutter with ablation. 6. Anticoagulation with Coumadin. 7. PAF. 8. Peripheral arterial disease. 9. History of multiple NSTEMIs. 10.Thoracic aortic aneurysm, maximal diameter 6.0 cm in October 2011     CT. 11.Peripheral arterial disease. 12.COPD. 13.Bipolar disorder. 14.Chronic kidney disease stage III. 15.History of diverticulitis. 16.Diabetes. 17.Hypertension. 18.Hyperlipidemia. 19.Remote history of tobacco use. 20.Family history of coronary artery disease. 21.History of prostate cancer.  SURGICAL HISTORY:  He is status  post cardiac catheterizations as well as bypass surgery, radiofrequency catheter ablation flutter, AAA repair, aortabifem, and partial colectomy.  Insertion of a Medtronic Maximo ICD in April 2006 with a 6949 lead replacement in 2010.  ALLERGIES:  He is allergic or intolerant to CRESTOR, AVELOX, AMBIEN, MORPHINE, PREDNISONE, ENALAPRIL, ALDACTONE AND LIPITOR.  CURRENT MEDICATIONS: 1. Finasteride 5 mg one half tablet daily. 2. Budesonide 1 nebulizer b.i.d. 3. Benicar 20 mg one half tablet daily. 4. Amiodarone daily. 5. Coumadin 5 mg as directed. 6. Lithium 300 mg b.i.d. 7. Spiriva daily. 8. Coreg 3.125 b.i.d. 9. Januvia 100 mg a day. 10.Iron 325 mg a day. 11.Lasix 40 mg 2 tablets daily. 12.Imdur 30 mg daily. 13.Sublingual nitroglycerin p.r.n. 14.Aspirin 81 mg a day. 15.Prilosec 40 mg a day. 16.Potassium 10 mEq a day. 17.Zyrtec 10 mg a day. 18.Flomax 0.4 mg a day. 19.Albuterol daily  p.r.n. 20.Xopenex inhaler 2 puffs q.i.d. p.r.n. 21.Levothyroxine 125 mcg as directed. 22.Vitamin B12 1000 mcg monthly IM.  Insertion of a Medtronic Maximo ICD in April 2006 with a 6949 lead replacement in 2010.  SOCIAL HISTORY:  [name] lives in [place] with his wife.  He quit tobacco in 2005 with an 80 pack-year history.  He has never abused alcohol or drugs.  He is a retired Curator.  FAMILY HISTORY:  His mother died in her 62s with a stroke.  His father died at 10 with lung cancer, but had an MI in his 82s.  At least 2 siblings have coronary artery disease.  REVIEW OF SYSTEMS:  He has chronic dyspnea on exertion, but feels that his respiratory status is at baseline.  He has not had fevers, chills or sweats.  He gets occasional reflux symptoms which resolved with Tums, but has not had melena.  He has some slight abdominal tenderness that is unusual for him.  He has had no new problems with urination or bowel movements.  He has occasional arthralgias.  His bipolar disorder is well controlled on current meds.  Full 14-point review of systems is otherwise negative except as stated in the HPI.  PHYSICAL EXAM:  VITAL SIGNS:  Temperature is 98.1, blood pressure 128/86, pulse 67, respiratory rate 23, O2 saturation 100% on room air. GENERAL:  He is well-developed, well-nourished white male in no acute distress. HEENT::  Normal. NECK:  There is no lymphadenopathy, thyromegaly, bruit or JVD noted. CV:  His heart is regular in rate and rhythm with an S1-S2.  No significant murmur, rub or gallop is noted.  Distal pulses are intact in all 4 extremities with slightly decreased DP pulses bilaterally. LUNGS:  He has good air exchange bilaterally, but rales are noted, especially in the bases. SKIN::  No rashes or lesions are noted. ABDOMEN:  Soft and nontender with active bowel sounds. EXTREMITIES:  There is no cyanosis, clubbing or edema noted. MUSCULOSKELETAL:  There is no joint  deformity or effusions, and no spine or CVA tenderness. NEURO:  He is alert and oriented.  Cranial nerves II-XII grossly intact.  Chest x-ray shows bibasilar atelectasis.  EKG sinus rhythm rate 67 with no ST elevation and inferolateral T-wave changes from an EKG dated on February 2012 are noted.  Laboratory values:  Hemoglobin 12.5, hematocrit 39.7, WBC 7.4, platelets 152,000.  INR 2.74.  Sodium 140, potassium 4.1, chloride 107, CO2 27, BUN 18, creatinine 1.24, glucose 121, CK-MB 61/2.1 with a troponin I of 0.07 and BNP of 165.  TSH 0.78 and hemoglobin A1c 6.1 on August 21, 2010, at  the [place] office.  IMPRESSION:  Mr. Jafari was seen today by Dr. Riley Kill, the patient evaluated and the data reviewed.  He is a 75 year old male who was well- known to Dr. Riley Kill.  He has known coronary artery disease with his last cath in 2011 showing an occluded saphenous vein graft to obtuse marginal.  The left internal mammary artery to left anterior descending was patent.  He now has recurrent chest pain requiring nitroglycerin. His EKG shows inferolateral ST and T-wave changes with a troponin I of 0.07.  He also has a known thoracoabdominal aneurysm that is managed conservatively.  We will do serial enzymes and not start heparin since his Coumadin level was therapeutic.  Further evaluation and treatment will depend on the results of the above testing.     Theodore Demark, PA-C   ______________________________ [name]. Riley Kill, MD, [facility]    RB/MEDQ  D:  08/23/2010  T:  08/24/2010  Job:  [id number]  Electronically Signed by Theodore Demark PA-C on 08/28/2010 01:33:39 PM Electronically Signed by [name] MD [facility] on 10/11/2010 09:12:31 AM

## 2010-10-16 ENCOUNTER — Telehealth: Payer: Self-pay | Admitting: Family Medicine

## 2010-10-16 NOTE — Telephone Encounter (Signed)
Urology took patient off of coumadin, he has been off for 5-6 days, patient was having trouble urinating, he was developing blood clots which was stopping up cath, diagnosed by Dr Peterson(urologist), now patient is able to urinate without cath so patient feels like he needs to get back on blood thinner, he has not had PT/INR recently, please advise

## 2010-10-17 ENCOUNTER — Ambulatory Visit (INDEPENDENT_AMBULATORY_CARE_PROVIDER_SITE_OTHER): Payer: Medicare Other | Admitting: *Deleted

## 2010-10-17 DIAGNOSIS — I4891 Unspecified atrial fibrillation: Secondary | ICD-10-CM

## 2010-10-17 DIAGNOSIS — Z7901 Long term (current) use of anticoagulants: Secondary | ICD-10-CM | POA: Insufficient documentation

## 2010-10-18 NOTE — Telephone Encounter (Signed)
He gets followed for this at [facility] clinic, so this question needs to be referred to them.  I just looked in his chart notes and it looks like he went to them 10/17/10 and restarted coumadin, so I guess the question has been taken care of.  Please call pt to verify this.  Thx--PM

## 2010-10-19 ENCOUNTER — Encounter: Payer: Self-pay | Admitting: Internal Medicine

## 2010-10-19 ENCOUNTER — Ambulatory Visit (INDEPENDENT_AMBULATORY_CARE_PROVIDER_SITE_OTHER): Payer: Medicare Other | Admitting: Internal Medicine

## 2010-10-19 DIAGNOSIS — Z4502 Encounter for adjustment and management of automatic implantable cardiac defibrillator: Secondary | ICD-10-CM

## 2010-10-19 DIAGNOSIS — I4891 Unspecified atrial fibrillation: Secondary | ICD-10-CM

## 2010-10-19 DIAGNOSIS — I428 Other cardiomyopathies: Secondary | ICD-10-CM

## 2010-10-19 DIAGNOSIS — Z95810 Presence of automatic (implantable) cardiac defibrillator: Secondary | ICD-10-CM

## 2010-10-19 DIAGNOSIS — I25810 Atherosclerosis of coronary artery bypass graft(s) without angina pectoris: Secondary | ICD-10-CM

## 2010-10-19 NOTE — Progress Notes (Signed)
HPI [name] returns today for followup. He is a pleasant 75 year old man with multiple medical problems including coronary artery disease, and ischemic cardiomyopathy, chronic systolic heart failure, paroxysmal atrial fibrillation, abdominal aortic aneurysm, and status post ICD implantation. The patient's main complaint today is trouble with urination. He has been followed by his urologist. Over the last few weeks however his symptoms have improved. He has had no ICD shocks, no chest pain, and no shortness of breath. He remains active. Allergies  Allergen Reactions  . Atorvastatin     REACTION: muscle ache in legs  . Crestor (Rosuvastatin Calcium) Other (See Comments)    Lower ext fatigue and soreness  . Lisinopril Diarrhea  . Morphine     REACTION: AMS - agitation  . Prednisone     REACTION: Can't breath  . Zolpidem Tartrate     REACTION: AMS     Current Outpatient Prescriptions  Medication Sig Dispense Refill  . acetaminophen (TYLENOL) 325 MG tablet Take 325 mg by mouth as needed.        Marland Kitchen albuterol (PROVENTIL) (2.5 MG/3ML) 0.083% nebulizer solution Take 2.5 mg by nebulization every 6 (six) hours as needed.        Marland Kitchen amiodarone (PACERONE) 200 MG tablet Take 200 mg by mouth daily.        Marland Kitchen aspirin 81 MG tablet Take 81 mg by mouth daily.        Marland Kitchen BENICAR 20 MG tablet TAKE ONE-HALF TABLET BY MOUTH DAILY  30 tablet  2  . budesonide (PULMICORT) 0.25 MG/2ML nebulizer solution Take 0.25 mg by nebulization 2 (two) times daily.        . carvedilol (COREG) 12.5 MG tablet Take 6.25 mg by mouth 2 (two) times daily with meals.        . cetirizine (ZYRTEC) 10 MG tablet Take 10 mg by mouth daily.        . clotrimazole-betamethasone (LOTRISONE) cream Apply topically 2 (two) times daily.        . cyanocobalamin 1000 MCG/ML injection Inject into the muscle every 30 (thirty) days.        Marland Kitchen desoximetasone (TOPICORT) 0.25 % cream Apply topically as needed.        . ferrous sulfate 325 (65 FE) MG  tablet Take 325 mg by mouth daily.       . finasteride (PROSCAR) 5 MG tablet Take 5 mg by mouth daily.        . furosemide (LASIX) 40 MG tablet Take 40 mg by mouth 2 (two) times daily.        Marland Kitchen griseofulvin (GRIFULVIN V) 500 MG tablet Take 500 mg by mouth daily.        Marland Kitchen guaiFENesin-dextromethorphan (ROBITUSSIN DM) 100-10 MG/5ML syrup As needed for cough       . HYDROcodone-homatropine (HYCODAN) 5-1.5 MG/5ML syrup Take by mouth as needed. 1/2 to 1 tsp.       Marland Kitchen ipratropium (ATROVENT) 0.03 % nasal spray Two sprays three times a day as needed before meals  30 mL  1  . isosorbide mononitrate (IMDUR) 60 MG 24 hr tablet Take 60 mg by mouth daily.        Marland Kitchen levalbuterol (XOPENEX HFA) 45 MCG/ACT inhaler Inhale 1-2 puffs into the lungs every 6 (six) hours as needed.  1 Inhaler  6  . levothyroxine (SYNTHROID, LEVOTHROID) 125 MCG tablet Take 125 mcg by mouth every other day.       . levothyroxine (SYNTHROID, LEVOTHROID) 125 MCG tablet  Take 62.5 mcg by mouth every other day.        . lithium 300 MG capsule Take 300 mg by mouth 2 (two) times daily with meals.        . mometasone (NASONEX) 50 MCG/ACT nasal spray 2 sprays by Nasal route 2 (two) times daily.        . Multiple Vitamin (MULTIVITAMIN) tablet Take 1 tablet by mouth daily.        . nitroGLYCERIN (NITROSTAT) 0.3 MG SL tablet Place 0.3 mg under the tongue every 5 (five) minutes as needed.        . NON FORMULARY 2 Act daily. Flutter Valve.       . NON FORMULARY as directed. Home nebulizer.       . potassium chloride (KLOR-CON) 10 MEQ CR tablet Take 10 mEq by mouth daily.        . pseudoephedrine-codeine-guaifenesin (GUAIFENESIN DAC) 30-10-100 MG/5ML solution        . sitaGLIPtan (JANUVIA) 100 MG tablet Take 100 mg by mouth daily.        . Tamsulosin HCl (FLOMAX) 0.4 MG CAPS Take 0.4 mg by mouth daily.        Marland Kitchen tiotropium (SPIRIVA) 18 MCG inhalation capsule Place 1 capsule (18 mcg total) into inhaler and inhale daily.  30 capsule  5  . warfarin  (COUMADIN) 5 MG tablet Take 1 tablet (5mg ) by mouth on Friday.  Take 1/2 tablet (2.5mg ) all other days.         Past Medical History  Diagnosis Date  . CAD (coronary artery disease)     post CABG with prior percutaneous intervention of teh saphenous vein graft with known saphenous vein graft disease.  Marland Kitchen AAA (abdominal aortic aneurysm) 2010    5.8cm  . Aneurysm of thoracic aorta 1995    Rupture 1995 w/spontaneous resolution  . Popliteal artery aneurysm   . Myocardial infarction   . Ischemic cardiomyopathy     EF 40%  . Paroxysmal atrial fibrillation   . Hypertension   . Peripheral vascular disease   . Dyslipidemia   . Diabetes mellitus 2007    dx'd 2007 w/persisting FBS>125  . GERD (gastroesophageal reflux disease)   . Hiatal hernia   . Diverticular disease   . BPH (benign prostatic hypertrophy)   . COPD (chronic obstructive pulmonary disease)     05/09/08 PFT FEV1 1.84 (55%), FVC 4.42 (95%), TLC 6.46 (92%), DLCO 72%, no BD response  . Pneumonia 1/09  . Allergy   . Shingles   . OSA (obstructive sleep apnea) 06/14/09    PSG RDI 17, PLMI 96, intolerant of CPAP or BPAP  . Chronic renal insufficiency     Baseline Cr 1.5  . Prostate cancer 07/2008    Low grade; watchful waiting with [name]  . Pulmonary nodule, left 03/02/2010    Left base; resolved on f/u CT  . Allergic rhinitis     ROS:   All systems reviewed and negative except as noted in the HPI.   Past Surgical History  Procedure Date  . Cardiac defibrillator placement 08/28/2004    [name]  . Colon surgery 1995    partial colectomy  . Coronary artery bypass graft 1996  . Aorta - bilateral femoral artery bypass graft 1999    [name] Early  . Nose surgery   . Insertion of pacing lead     New rate sensing pacing lead with removal of a previous implanted ICD and  insertion of a device back in the pocket with defibrillation threshold testing  . Abdominal aortic aneurysm repair 1999    Ingrarenal  repair/ graft  . Stents     Placed to the saphenous vein graft  . Radiofrequency ablation 2006    A flutter     Family History  Problem Relation Age of Onset  . Stroke Mother 68  . Heart attack Mother     CVA  . Cancer Father 43    Lung  . Heart attack Father 37  . Lung cancer Father   . Coronary artery disease Other     2 of 5 siblings with CAD     History   Social History  . Marital Status: Married    Spouse Name: N/A    Number of Children: N/A  . Years of Education: N/A   Occupational History  . RETIRED     Mechanic   Social History Main Topics  . Smoking status: Former Smoker -- 1.0 packs/day for 30 years    Types: Cigarettes    Quit date: 05/23/2003  . Smokeless tobacco: Former Neurosurgeon    Types: Chew    Quit date: 05/14/2003   Comment: 80 pack a year history  . Alcohol Use: 0.6 oz/week    1 Cans of beer per week     only occ  . Drug Use: No  . Sexually Active: Not on file   Other Topics Concern  . Not on file   Social History Narrative   Lives in [place] with his wifePlays golf twice a week, but rides the cart to the ball, hits it, and then gets back in the cart     BP 95/53  Pulse 58  Ht 6' (1.829 m)  Wt 170 lb (77.111 kg)  BMI 23.06 kg/m2  Physical Exam:  Well appearing NAD HEENT: Unremarkable Neck:  No JVD, no thyromegally Lymphatics:  No adenopathy Back:  No CVA tenderness Lungs:  Clear. Well-healed ICD incision HEART:  Regular rate rhythm, no murmurs, no rubs, no clicks Abd:  Flat, positive bowel sounds, no organomegally, no rebound, no guarding Ext:  2 plus pulses, no edema, no cyanosis, no clubbing Skin:  No rashes no nodules Neuro:  CN II through XII intact, motor grossly intact  DEVICE  Normal device function.  See PaceArt for details.   Assess/Plan:

## 2010-10-19 NOTE — Assessment & Plan Note (Signed)
Patient symptoms are well controlled. He is maintaining sinus rhythm. He will continue his current medications.

## 2010-10-19 NOTE — Telephone Encounter (Signed)
Pt states it has been taken care of

## 2010-10-19 NOTE — Patient Instructions (Signed)
Your physician recommends that you schedule a follow-up appointment in: 3 months with device clinic and 12 months with Dr Ladona Ridgel  Your physician recommends that you continue on your current medications as directed. Please refer to the Current Medication list given to you today.

## 2010-10-19 NOTE — Assessment & Plan Note (Signed)
His device is working normally. No intercurrent ICD shocks. Will recheck in several months.

## 2010-10-19 NOTE — Assessment & Plan Note (Signed)
He denies anginal symptoms. He will continue his regular exercise routine. No changes in medications today.

## 2010-10-25 ENCOUNTER — Encounter: Payer: Medicare Other | Admitting: *Deleted

## 2010-10-25 ENCOUNTER — Encounter: Payer: Self-pay | Admitting: Internal Medicine

## 2010-10-26 ENCOUNTER — Ambulatory Visit (INDEPENDENT_AMBULATORY_CARE_PROVIDER_SITE_OTHER): Payer: Medicare Other | Admitting: *Deleted

## 2010-10-26 DIAGNOSIS — I4891 Unspecified atrial fibrillation: Secondary | ICD-10-CM

## 2010-11-02 ENCOUNTER — Ambulatory Visit (INDEPENDENT_AMBULATORY_CARE_PROVIDER_SITE_OTHER): Payer: Medicare Other | Admitting: *Deleted

## 2010-11-02 DIAGNOSIS — I4891 Unspecified atrial fibrillation: Secondary | ICD-10-CM

## 2010-11-02 LAB — POCT INR: INR: 2.2

## 2010-11-15 ENCOUNTER — Other Ambulatory Visit: Payer: Self-pay | Admitting: Family Medicine

## 2010-11-15 ENCOUNTER — Ambulatory Visit (INDEPENDENT_AMBULATORY_CARE_PROVIDER_SITE_OTHER): Payer: Medicare Other | Admitting: Family Medicine

## 2010-11-15 ENCOUNTER — Encounter: Payer: Self-pay | Admitting: Family Medicine

## 2010-11-15 ENCOUNTER — Ambulatory Visit (INDEPENDENT_AMBULATORY_CARE_PROVIDER_SITE_OTHER)
Admission: RE | Admit: 2010-11-15 | Discharge: 2010-11-15 | Disposition: A | Discharge status: Home / Self Care | Payer: Medicare Other | Source: Ambulatory Visit | Attending: Family Medicine | Admitting: Family Medicine

## 2010-11-15 ENCOUNTER — Telehealth: Payer: Self-pay | Admitting: Family Medicine

## 2010-11-15 ENCOUNTER — Ambulatory Visit (HOSPITAL_BASED_OUTPATIENT_CLINIC_OR_DEPARTMENT_OTHER)
Admission: RE | Admit: 2010-11-15 | Discharge: 2010-11-15 | Disposition: A | Discharge status: Home / Self Care | Payer: Medicare Other | Source: Ambulatory Visit | Attending: Family Medicine | Admitting: Family Medicine

## 2010-11-15 VITALS — BP 100/58 | HR 66 | Temp 98.9°F | Ht 72.0 in | Wt 165.0 lb

## 2010-11-15 DIAGNOSIS — N433 Hydrocele, unspecified: Secondary | ICD-10-CM | POA: Insufficient documentation

## 2010-11-15 DIAGNOSIS — R8281 Pyuria: Secondary | ICD-10-CM

## 2010-11-15 DIAGNOSIS — R52 Pain, unspecified: Secondary | ICD-10-CM

## 2010-11-15 DIAGNOSIS — N508 Other specified disorders of male genital organs: Secondary | ICD-10-CM

## 2010-11-15 DIAGNOSIS — N509 Disorder of male genital organs, unspecified: Secondary | ICD-10-CM

## 2010-11-15 DIAGNOSIS — R82998 Other abnormal findings in urine: Secondary | ICD-10-CM

## 2010-11-15 DIAGNOSIS — I861 Scrotal varices: Secondary | ICD-10-CM | POA: Insufficient documentation

## 2010-11-15 DIAGNOSIS — N50819 Testicular pain, unspecified: Secondary | ICD-10-CM | POA: Insufficient documentation

## 2010-11-15 LAB — POCT URINALYSIS DIPSTICK
Bilirubin, UA: NEGATIVE
Glucose, UA: NEGATIVE
Nitrite, UA: NEGATIVE

## 2010-11-15 MED ORDER — CIPROFLOXACIN HCL 500 MG PO TABS: 500.0000 mg | ORAL_TABLET | Freq: Two times a day (BID) | ORAL | Status: DC

## 2010-11-15 MED ORDER — HYDROCODONE-ACETAMINOPHEN 5-325 MG PO TABS: ORAL_TABLET | ORAL | Status: DC

## 2010-11-15 NOTE — Telephone Encounter (Signed)
Discussed u/s results with pt: epididymo-orchitis on the right, with small epididymal cysts, small varicocele, and small tunica albuginea cyst --all on the right. Called in cipro 500mg  bid x 10d to his pharmacy. Will recheck PT/INR when I see him next week (cipro may increase levels of coumadin) to make sure no dose adjustment is needed.

## 2010-11-15 NOTE — Assessment & Plan Note (Addendum)
Obtain u/s for further evaluation: r/o varicocele, spermatocele, hematoma, epididymal cyst. UA today showed blood and LEU, sent this for c/s. If u/s shows no definitive anatomical cause of his pain, will start empiric antibiotic for epididymo-orchitis. Gave norco 5/325, 1-2 q6h prn severe pain.  Therapeutic expectations and side effect profile of medication discussed today.  Patient's questions answered. He has a routine f/u appt already set in 5d. (Of note, he is back on coumadin and his INR was therapeutic about 2 wks ago.  Next PT/INR is scheduled for tomorrow at [facility] coumadin clinic.)

## 2010-11-15 NOTE — Progress Notes (Signed)
OFFICE NOTE  11/15/2010  CC:  Chief Complaint  Patient presents with  . Testicle Pain    radiating into groin, been hurting for a while, but began hurting worse yesterdayand last night, lump on testicle     HPI:   Patient is a 75 y.o. Caucasian male who is here for right testicle pain. Onset about a month ago and has been mild up until 1-2 days ago --got worse, tylenol 1000 mg helps moderately well. Radiates into right groin area intermittently.  No dysuria, no hematuria, no fever/chills.  No nausea.  Pertinent PMH:  DM 2, Cardiomyopathy, parox a-fib, COPD, recent hx of acute urinary retention with hematuria, BPH, AAA, hypothyroidism, CRI, anemia, hx of low grade prostate cancer.  MEDS;   Outpatient Prescriptions Prior to Visit  Medication Sig Dispense Refill  . acetaminophen (TYLENOL) 325 MG tablet Take 325 mg by mouth as needed.        Marland Kitchen albuterol (PROVENTIL) (2.5 MG/3ML) 0.083% nebulizer solution Take 2.5 mg by nebulization every 6 (six) hours as needed.        Marland Kitchen amiodarone (PACERONE) 200 MG tablet Take 200 mg by mouth daily.        Marland Kitchen aspirin 81 MG tablet Take 81 mg by mouth daily.        Marland Kitchen BENICAR 20 MG tablet TAKE ONE-HALF TABLET BY MOUTH DAILY  30 tablet  2  . budesonide (PULMICORT) 0.25 MG/2ML nebulizer solution Take 0.25 mg by nebulization 2 (two) times daily.        . carvedilol (COREG) 12.5 MG tablet Take 6.25 mg by mouth 2 (two) times daily with meals.        . cetirizine (ZYRTEC) 10 MG tablet Take 10 mg by mouth daily.        . clotrimazole-betamethasone (LOTRISONE) cream Apply topically 2 (two) times daily.        . cyanocobalamin 1000 MCG/ML injection Inject into the muscle every 30 (thirty) days.        Marland Kitchen desoximetasone (TOPICORT) 0.25 % cream Apply topically as needed.        . ferrous sulfate 325 (65 FE) MG tablet Take 325 mg by mouth daily.       . finasteride (PROSCAR) 5 MG tablet Take 5 mg by mouth daily.        . furosemide (LASIX) 40 MG tablet Take 40 mg by  mouth 2 (two) times daily.        Marland Kitchen griseofulvin (GRIFULVIN V) 500 MG tablet Take 500 mg by mouth daily.        Marland Kitchen guaiFENesin-dextromethorphan (ROBITUSSIN DM) 100-10 MG/5ML syrup As needed for cough       . HYDROcodone-homatropine (HYCODAN) 5-1.5 MG/5ML syrup Take by mouth as needed. 1/2 to 1 tsp.       Marland Kitchen ipratropium (ATROVENT) 0.03 % nasal spray Two sprays three times a day as needed before meals  30 mL  1  . isosorbide mononitrate (IMDUR) 60 MG 24 hr tablet Take 60 mg by mouth daily.        Marland Kitchen levalbuterol (XOPENEX HFA) 45 MCG/ACT inhaler Inhale 1-2 puffs into the lungs every 6 (six) hours as needed.  1 Inhaler  6  . levothyroxine (SYNTHROID, LEVOTHROID) 125 MCG tablet Take 125 mcg by mouth every other day.       . levothyroxine (SYNTHROID, LEVOTHROID) 125 MCG tablet Take 62.5 mcg by mouth every other day.        . lithium 300 MG capsule Take  300 mg by mouth 2 (two) times daily with meals.        . mometasone (NASONEX) 50 MCG/ACT nasal spray 2 sprays by Nasal route 2 (two) times daily.        . Multiple Vitamin (MULTIVITAMIN) tablet Take 1 tablet by mouth daily.        . nitroGLYCERIN (NITROSTAT) 0.3 MG SL tablet Place 0.3 mg under the tongue every 5 (five) minutes as needed.        . NON FORMULARY 2 Act daily. Flutter Valve.       . NON FORMULARY as directed. Home nebulizer.       . potassium chloride (KLOR-CON) 10 MEQ CR tablet Take 10 mEq by mouth daily.        . pseudoephedrine-codeine-guaifenesin (GUAIFENESIN DAC) 30-10-100 MG/5ML solution        . sitaGLIPtan (JANUVIA) 100 MG tablet Take 100 mg by mouth daily.        . Tamsulosin HCl (FLOMAX) 0.4 MG CAPS Take 0.4 mg by mouth daily.        Marland Kitchen tiotropium (SPIRIVA) 18 MCG inhalation capsule Place 1 capsule (18 mcg total) into inhaler and inhale daily.  30 capsule  5  . warfarin (COUMADIN) 5 MG tablet Take 1 tablet (5mg ) by mouth on Friday.  Take 1/2 tablet (2.5mg ) all other days.        PE: Blood pressure 100/58, pulse 66, temperature 98.9  F (37.2 C), temperature source Temporal, height 6' (1.829 m), weight 165 lb (74.844 kg), SpO2 97.00%. Gen: Alert, well appearing.  Patient is oriented to person, place, time, and situation. GU: no significant groin bulge.  No groin tenderness.  Uncircumcised penis, foreskin retractable without problem. Left Testicle hangs lower than right, testicle slightly smaller than right, nontender. Right hemiscrotum full, with diffuse testicular tenderness.  One congested venule is visble in right scrotal skin.  No mass palpable.  No erythema.  LAB: CC UA today showed trace lysed blood, large LEU, otherwise normal.  IMPRESSION AND PLAN:  Testicle pain Obtain u/s for further evaluation: r/o varicocele, spermatocele, hematoma, epididymal cyst. UA today showed blood and LEU, sent this for c/s. If u/s shows no definitive anatomical cause of his pain, will start empiric antibiotic for epididymo-orchitis. Gave norco 5/325, 1-2 q6h prn severe pain.  Therapeutic expectations and side effect profile of medication discussed today.  Patient's questions answered. He has a routine f/u appt already set in 5d. (Of note, he is back on coumadin and his INR was therapeutic about 2 wks ago.  Next PT/INR is scheduled for tomorrow at [facility] coumadin clinic.)     FOLLOW UP:  No Follow-up on file.

## 2010-11-16 ENCOUNTER — Encounter: Payer: Medicare Other | Admitting: *Deleted

## 2010-11-17 LAB — URINE CULTURE: Colony Count: >=100000

## 2010-11-20 ENCOUNTER — Encounter: Payer: Self-pay | Admitting: Family Medicine

## 2010-11-20 ENCOUNTER — Other Ambulatory Visit (INDEPENDENT_AMBULATORY_CARE_PROVIDER_SITE_OTHER): Payer: Medicare Other | Admitting: *Deleted

## 2010-11-20 ENCOUNTER — Other Ambulatory Visit: Payer: Self-pay | Admitting: Family Medicine

## 2010-11-20 ENCOUNTER — Ambulatory Visit (INDEPENDENT_AMBULATORY_CARE_PROVIDER_SITE_OTHER): Payer: Medicare Other | Admitting: *Deleted

## 2010-11-20 ENCOUNTER — Ambulatory Visit (HOSPITAL_BASED_OUTPATIENT_CLINIC_OR_DEPARTMENT_OTHER)
Admission: RE | Admit: 2010-11-20 | Discharge: 2010-11-20 | Disposition: A | Discharge status: Home / Self Care | Payer: Medicare Other | Source: Ambulatory Visit | Attending: Family Medicine | Admitting: Family Medicine

## 2010-11-20 ENCOUNTER — Ambulatory Visit (INDEPENDENT_AMBULATORY_CARE_PROVIDER_SITE_OTHER): Payer: Medicare Other | Admitting: Cardiology

## 2010-11-20 ENCOUNTER — Ambulatory Visit (INDEPENDENT_AMBULATORY_CARE_PROVIDER_SITE_OTHER): Payer: Medicare Other | Admitting: Family Medicine

## 2010-11-20 ENCOUNTER — Encounter: Payer: Self-pay | Admitting: Cardiology

## 2010-11-20 ENCOUNTER — Ambulatory Visit (INDEPENDENT_AMBULATORY_CARE_PROVIDER_SITE_OTHER)
Admission: RE | Admit: 2010-11-20 | Discharge: 2010-11-20 | Disposition: A | Discharge status: Home / Self Care | Payer: Medicare Other | Source: Ambulatory Visit | Attending: Family Medicine | Admitting: Family Medicine

## 2010-11-20 VITALS — BP 148/74 | HR 72 | Ht 72.0 in | Wt 165.0 lb

## 2010-11-20 DIAGNOSIS — D510 Vitamin B12 deficiency anemia due to intrinsic factor deficiency: Secondary | ICD-10-CM

## 2010-11-20 DIAGNOSIS — J309 Allergic rhinitis, unspecified: Secondary | ICD-10-CM

## 2010-11-20 DIAGNOSIS — N508 Other specified disorders of male genital organs: Secondary | ICD-10-CM | POA: Insufficient documentation

## 2010-11-20 DIAGNOSIS — B351 Tinea unguium: Secondary | ICD-10-CM

## 2010-11-20 DIAGNOSIS — I219 Acute myocardial infarction, unspecified: Secondary | ICD-10-CM

## 2010-11-20 DIAGNOSIS — D509 Iron deficiency anemia, unspecified: Secondary | ICD-10-CM

## 2010-11-20 DIAGNOSIS — C61 Malignant neoplasm of prostate: Secondary | ICD-10-CM

## 2010-11-20 DIAGNOSIS — N50819 Testicular pain, unspecified: Secondary | ICD-10-CM

## 2010-11-20 DIAGNOSIS — I714 Abdominal aortic aneurysm, without rupture: Secondary | ICD-10-CM

## 2010-11-20 DIAGNOSIS — R319 Hematuria, unspecified: Secondary | ICD-10-CM

## 2010-11-20 DIAGNOSIS — E039 Hypothyroidism, unspecified: Secondary | ICD-10-CM

## 2010-11-20 DIAGNOSIS — N433 Hydrocele, unspecified: Secondary | ICD-10-CM

## 2010-11-20 DIAGNOSIS — I2510 Atherosclerotic heart disease of native coronary artery without angina pectoris: Secondary | ICD-10-CM

## 2010-11-20 DIAGNOSIS — R490 Dysphonia: Secondary | ICD-10-CM

## 2010-11-20 DIAGNOSIS — E785 Hyperlipidemia, unspecified: Secondary | ICD-10-CM

## 2010-11-20 DIAGNOSIS — I739 Peripheral vascular disease, unspecified: Secondary | ICD-10-CM

## 2010-11-20 DIAGNOSIS — I4891 Unspecified atrial fibrillation: Secondary | ICD-10-CM

## 2010-11-20 DIAGNOSIS — Z95810 Presence of automatic (implantable) cardiac defibrillator: Secondary | ICD-10-CM

## 2010-11-20 DIAGNOSIS — N5089 Other specified disorders of the male genital organs: Secondary | ICD-10-CM

## 2010-11-20 DIAGNOSIS — I501 Left ventricular failure: Secondary | ICD-10-CM

## 2010-11-20 DIAGNOSIS — I10 Essential (primary) hypertension: Secondary | ICD-10-CM

## 2010-11-20 DIAGNOSIS — I4892 Unspecified atrial flutter: Secondary | ICD-10-CM

## 2010-11-20 DIAGNOSIS — I712 Thoracic aortic aneurysm, without rupture: Secondary | ICD-10-CM

## 2010-11-20 DIAGNOSIS — I2589 Other forms of chronic ischemic heart disease: Secondary | ICD-10-CM

## 2010-11-20 DIAGNOSIS — Z8679 Personal history of other diseases of the circulatory system: Secondary | ICD-10-CM

## 2010-11-20 DIAGNOSIS — E119 Type 2 diabetes mellitus without complications: Secondary | ICD-10-CM

## 2010-11-20 DIAGNOSIS — N259 Disorder resulting from impaired renal tubular function, unspecified: Secondary | ICD-10-CM

## 2010-11-20 DIAGNOSIS — J449 Chronic obstructive pulmonary disease, unspecified: Secondary | ICD-10-CM

## 2010-11-20 DIAGNOSIS — N509 Disorder of male genital organs, unspecified: Secondary | ICD-10-CM

## 2010-11-20 DIAGNOSIS — I25810 Atherosclerosis of coronary artery bypass graft(s) without angina pectoris: Secondary | ICD-10-CM

## 2010-11-20 MED ORDER — HYDROCODONE-ACETAMINOPHEN 5-325 MG PO TABS: ORAL_TABLET | ORAL | Status: DC

## 2010-11-20 NOTE — Assessment & Plan Note (Signed)
CT 09/2010 showed slight interval increase in size of infrarenal AAA proximal to the aortoiliac graft: 3.6-4.2 cm.   Also 1.9 cm aneurism at the graft. Dr. Arbie Cookey following: no plans for surgery due to patient's poor cardiac function.

## 2010-11-20 NOTE — Assessment & Plan Note (Signed)
Right epididymo-orchitis; suspect right testicle enlargement since u/s 5d ago.  Pain minimally improved.  Continue cipro 500mg  bid (he is on day 5 of 14). Repeat scrotal u/s today to r/o abscess. Pain med rx has RF, plus I gave additional rx for #30 norco today. Recommended OTC miralax for constipation.

## 2010-11-20 NOTE — Assessment & Plan Note (Signed)
TSH today.  Last TSH:  Lab Results  Component Value Date   TSH 0.78 08/21/2010

## 2010-11-20 NOTE — Patient Instructions (Signed)
Your physician recommends that you schedule a follow-up appointment in: 6 weeks with Dr. Stuckey. 

## 2010-11-20 NOTE — Assessment & Plan Note (Signed)
Stable.  Continue diet/med. D.R screening UTD, urine microalbumin neg this year. Last 2 HbA1c's this year were < 6.5%.   Will repeat HbA1c at next f/u around 02/2011.

## 2010-11-20 NOTE — Progress Notes (Signed)
OFFICE VISIT  11/20/2010   CC:  Chief Complaint  Patient presents with  . Follow-up    3 month follow up-all med problems     HPI:    Patient is a 75 y.o. Caucasian male who presents for f/u DM 2, right epididymo-orchitis, paroxysmal a-fib, ischemic cardiomyopathy, AAA. Still feeling right testicle pain, feels like it is enlarged more than when I saw him 5d ago.  He is on day 5 of 14 of cipro 500mg  bid (urine clx + klebsiella, sensitive to cipro and others).  Also is taking 2 norco 4 times per day for testicle pain and is in need of his RF of this.  No BM in several days but doesn't feel uncomfortable from this. No dysuria, no fever.  Some radiation of the testicle pain to right groin area but no abd pain or n/v.  Got recent f/u with Dr. Arbie Cookey, repeat chest and abd CT to follow aneurisms---minimal change.  No change in plan--no surgery. Has f/u with Dr. Riley Kill this afternoon.  Says glucose checks once daily are normal. Denies any acute vision changes, denies pain/numbness in toes or feet.  Past Medical History  Diagnosis Date  . CAD (coronary artery disease)     post CABG with prior percutaneous intervention of teh saphenous vein graft with known saphenous vein graft disease.  Marland Kitchen AAA (abdominal aortic aneurysm) 2010    5.8cm  . Aneurysm of thoracic aorta 1995    Rupture 1995 w/spontaneous resolution  . Popliteal artery aneurysm   . Myocardial infarction   . Ischemic cardiomyopathy     EF 40%  . Paroxysmal atrial fibrillation   . Hypertension   . Peripheral vascular disease   . Dyslipidemia   . Diabetes mellitus 2007    dx'd 2007 w/persisting FBS>125  . GERD (gastroesophageal reflux disease)   . Hiatal hernia   . Diverticular disease   . BPH (benign prostatic hypertrophy)   . COPD (chronic obstructive pulmonary disease)     05/09/08 PFT FEV1 1.84 (55%), FVC 4.42 (95%), TLC 6.46 (92%), DLCO 72%, no BD response  . Pneumonia 1/09  . Allergy   . Shingles   . OSA  (obstructive sleep apnea) 06/14/09    PSG RDI 17, PLMI 96, intolerant of CPAP or BPAP  . Chronic renal insufficiency     Baseline Cr 1.5  . Prostate cancer 07/2008    Low grade; watchful waiting with [name]  . Pulmonary nodule, left 03/02/2010    Left base; resolved on f/u CT  . Allergic rhinitis     Past Surgical History  Procedure Date  . Cardiac defibrillator placement 08/28/2004    [name]  . Colon surgery 1995    partial colectomy  . Coronary artery bypass graft 1996  . Aorta - bilateral femoral artery bypass graft 1999    [name] Early  . Nose surgery   . Insertion of pacing lead     New rate sensing pacing lead with removal of a previous implanted ICD and insertion of a device back in the pocket with defibrillation threshold testing  . Abdominal aortic aneurysm repair 1999    Ingrarenal repair/ graft  . Stents     Placed to the saphenous vein graft  . Radiofrequency ablation 2006    A flutter    Outpatient Prescriptions Prior to Visit  Medication Sig Dispense Refill  . acetaminophen (TYLENOL) 325 MG tablet Take 325 mg by mouth as needed.        Marland Kitchen  albuterol (PROVENTIL) (2.5 MG/3ML) 0.083% nebulizer solution Take 2.5 mg by nebulization every 6 (six) hours as needed.        Marland Kitchen amiodarone (PACERONE) 200 MG tablet Take 200 mg by mouth daily.        Marland Kitchen aspirin 81 MG tablet Take 81 mg by mouth daily.        Marland Kitchen BENICAR 20 MG tablet TAKE ONE-HALF TABLET BY MOUTH DAILY  30 tablet  2  . budesonide (PULMICORT) 0.25 MG/2ML nebulizer solution Take 0.25 mg by nebulization 2 (two) times daily.        . carvedilol (COREG) 12.5 MG tablet Take 6.25 mg by mouth 2 (two) times daily with meals.        . cetirizine (ZYRTEC) 10 MG tablet Take 10 mg by mouth daily.        . ciprofloxacin (CIPRO) 500 MG tablet Take 1 tablet (500 mg total) by mouth 2 (two) times daily.  20 tablet  0  . clotrimazole-betamethasone (LOTRISONE) cream Apply topically 2 (two) times daily.        .  cyanocobalamin 1000 MCG/ML injection Inject into the muscle every 30 (thirty) days.        Marland Kitchen desoximetasone (TOPICORT) 0.25 % cream Apply topically as needed.        . ferrous sulfate 325 (65 FE) MG tablet Take 325 mg by mouth daily.       . finasteride (PROSCAR) 5 MG tablet Take 5 mg by mouth daily.        . furosemide (LASIX) 40 MG tablet Take 40 mg by mouth 2 (two) times daily.        Marland Kitchen guaiFENesin-dextromethorphan (ROBITUSSIN DM) 100-10 MG/5ML syrup As needed for cough       . ipratropium (ATROVENT) 0.03 % nasal spray Two sprays three times a day as needed before meals  30 mL  1  . isosorbide mononitrate (IMDUR) 60 MG 24 hr tablet Take 60 mg by mouth daily.        Marland Kitchen levalbuterol (XOPENEX HFA) 45 MCG/ACT inhaler Inhale 1-2 puffs into the lungs every 6 (six) hours as needed.  1 Inhaler  6  . levothyroxine (SYNTHROID, LEVOTHROID) 125 MCG tablet Take 125 mcg by mouth every other day.       . levothyroxine (SYNTHROID, LEVOTHROID) 125 MCG tablet Take 62.5 mcg by mouth every other day.        . lithium 300 MG capsule Take 300 mg by mouth 2 (two) times daily with meals.        . mometasone (NASONEX) 50 MCG/ACT nasal spray 2 sprays by Nasal route 2 (two) times daily.        . Multiple Vitamin (MULTIVITAMIN) tablet Take 1 tablet by mouth daily.        . nitroGLYCERIN (NITROSTAT) 0.3 MG SL tablet Place 0.3 mg under the tongue every 5 (five) minutes as needed.        . NON FORMULARY 2 Act daily. Flutter Valve.       . NON FORMULARY as directed. Home nebulizer.       . potassium chloride (KLOR-CON) 10 MEQ CR tablet Take 10 mEq by mouth daily.        . sitaGLIPtan (JANUVIA) 100 MG tablet Take 100 mg by mouth daily.        . Tamsulosin HCl (FLOMAX) 0.4 MG CAPS Take 0.4 mg by mouth daily.        Marland Kitchen tiotropium (SPIRIVA) 18 MCG inhalation capsule Place  1 capsule (18 mcg total) into inhaler and inhale daily.  30 capsule  5  . warfarin (COUMADIN) 5 MG tablet Take 1 tablet (5mg ) by mouth on Friday.  Take 1/2  tablet (2.5mg ) all other days.      Marland Kitchen HYDROcodone-acetaminophen (NORCO) 5-325 MG per tablet 1-2 tabs po q6h prn pain  30 tablet  1  . griseofulvin (GRIFULVIN V) 500 MG tablet Take 500 mg by mouth daily.        Marland Kitchen HYDROcodone-homatropine (HYCODAN) 5-1.5 MG/5ML syrup Take by mouth as needed. 1/2 to 1 tsp.       . pseudoephedrine-codeine-guaifenesin (GUAIFENESIN DAC) 30-10-100 MG/5ML solution          Allergies  Allergen Reactions  . Atorvastatin     REACTION: muscle ache in legs  . Crestor (Rosuvastatin Calcium) Other (See Comments)    Lower ext fatigue and soreness  . Lisinopril Diarrhea  . Morphine     REACTION: AMS - agitation  . Prednisone     REACTION: Can't breath  . Zolpidem Tartrate     REACTION: AMS    ROS As per HPI  PE: Blood pressure 148/74, pulse 72, height 6' (1.829 m), weight 165 lb (74.844 kg), SpO2 94.00%. Gen: Alert, well appearing.  Patient is oriented to person, place, time, and situation. Chest: symmetric expansion, nonlabored respirations.  Clear and equal breath sounds in all lung fields.   CV: RRR, 1/6 systolic murmur at apex, no rub or gallop.  EXT: no clubbing, cyanosis, or edema.  GU: right testicle about 50% larger than left, diffusely tender, indurated/firm posteriorly.  No groin mass or tenderness. Left testicle normal.  Penis normal.  LABS:  none  IMPRESSION AND PLAN:  Testicle pain Right epididymo-orchitis; suspect right testicle enlargement since u/s 5d ago.  Pain minimally improved.  Continue cipro 500mg  bid (he is on day 5 of 14). Repeat scrotal u/s today to r/o abscess. Pain med rx has RF, plus I gave additional rx for #30 norco today. Recommended OTC miralax for constipation.   DIABETES MELLITUS, TYPE II Stable.  Continue diet/med. D.R screening UTD, urine microalbumin neg this year. Last 2 HbA1c's this year were < 6.5%.   Will repeat HbA1c at next f/u around 02/2011.  ISCHEMIC CARDIOMYOPATHY Stable.  Has cardiology f/u with Dr.  Riley Kill this afternoon. Will order CMET and TSH for when he is there to get his PT/INR today.  ANEURYSM, THORACIC AORTIC CT chest 03/01/10>>Multiple areas of aneurysmal dilatation of the thoracic aorta.  Diameter top of aortic arch 4.1 cm.  Diameter of descending thoracic aorta 5.4 cm.  Diameter at the level of the diaphragm is 6.0 cm. On f/u CT chest 09/2010 the diameter of the diaphragmatic portion had increased to 6.2 cm.  Dr. Arbie Cookey following---no plans for surgery due to patient's poor cardiac function.   ABDOMINAL AORTIC ANEURYSM CT 09/2010 showed slight interval increase in size of infrarenal AAA proximal to the aortoiliac graft: 3.6-4.2 cm.   Also 1.9 cm aneurism at the graft. Dr. Arbie Cookey following: no plans for surgery due to patient's poor cardiac function.   HYPOTHYROIDISM TSH today.  Last TSH:  Lab Results  Component Value Date   TSH 0.78 08/21/2010     IRON DEFICIENCY Dx'd 05/2010.   Hemoccults were negative, and his GI MD ([name]) recommended no procedure at this time but simply cautious obs + iron replacement for now. We'll continue iron and repeat CBC and IBC next f/u around 02/2011.    Prostate  cancer Hx. Of low-grade prostate cancer, was scheduled to get bx a couple of months ago but had multiple episodes of acute urinary retention so the procedure was put off. He will f/u with his new Urologist, Dr. Mena Goes ([name] retired), soon for this problem. He has BPH and bladder thickening, hx of recurrent UTIs (and now epididymitis): I wonder about getting him on daily antibiotic prophylaxis for this problem?  Will see what urology does and go from there. I do want to repeat his urinalysis the week of 12/03/10.     FOLLOW UP: No Follow-up on file.

## 2010-11-20 NOTE — Assessment & Plan Note (Signed)
CT chest 03/01/10>>Multiple areas of aneurysmal dilatation of the thoracic aorta.  Diameter top of aortic arch 4.1 cm.  Diameter of descending thoracic aorta 5.4 cm.  Diameter at the level of the diaphragm is 6.0 cm. On f/u CT chest 09/2010 the diameter of the diaphragmatic portion had increased to 6.2 cm.  Dr. Arbie Cookey following---no plans for surgery due to patient's poor cardiac function.

## 2010-11-20 NOTE — Patient Instructions (Signed)
Buy over the counter med called miralax (glycolax) and take 1 capful 1-2 times per day as needed for constipation. Elevate and support genital area to decrease pain.  Also, apply ice pack to painful genital area for 20 min 2-3 times per day.

## 2010-11-20 NOTE — Assessment & Plan Note (Signed)
Hx. Of low-grade prostate cancer, was scheduled to get bx a couple of months ago but had multiple episodes of acute urinary retention so the procedure was put off. He will f/u with his new Urologist, Dr. Mena Goes ([name] retired), soon for this problem. He has BPH and bladder thickening, hx of recurrent UTIs (and now epididymitis): I wonder about getting him on daily antibiotic prophylaxis for this problem?  Will see what urology does and go from there. I do want to repeat his urinalysis the week of 12/03/10.

## 2010-11-20 NOTE — Assessment & Plan Note (Signed)
Dx'd 05/2010.   Hemoccults were negative, and his GI MD ([name]) recommended no procedure at this time but simply cautious obs + iron replacement for now. We'll continue iron and repeat CBC and IBC next f/u around 02/2011.

## 2010-11-20 NOTE — Assessment & Plan Note (Signed)
Stable.  Has cardiology f/u with Dr. Riley Kill this afternoon. Will order CMET and TSH for when he is there to get his PT/INR today.

## 2010-11-21 LAB — HEPATIC FUNCTION PANEL
ALT: 20 U/L (ref 0–53)
AST: 17 U/L (ref 0–37)
Bilirubin, Direct: 0.1 mg/dL (ref 0.0–0.3)
Total Bilirubin: 0.5 mg/dL (ref 0.3–1.2)
Total Protein: 7.2 g/dL (ref 6.0–8.3)

## 2010-11-21 LAB — BASIC METABOLIC PANEL
BUN: 29 mg/dL — ABNORMAL HIGH (ref 6–23)
Calcium: 9.4 mg/dL (ref 8.4–10.5)
GFR: 41.67 mL/min — ABNORMAL LOW (ref >60.00)
Potassium: 4 mEq/L (ref 3.5–5.1)
Sodium: 140 mEq/L (ref 135–145)

## 2010-11-21 LAB — CBC WITH DIFFERENTIAL/PLATELET
Basophils Absolute: 0 10*3/uL (ref 0.0–0.1)
Eosinophils Absolute: 0.2 10*3/uL (ref 0.0–0.7)
Lymphocytes Relative: 11.8 % — ABNORMAL LOW (ref 12.0–46.0)
MCHC: 33.6 g/dL (ref 30.0–36.0)
Neutrophils Relative %: 79.8 % — ABNORMAL HIGH (ref 43.0–77.0)
RDW: 12.9 % (ref 11.5–14.6)

## 2010-11-22 ENCOUNTER — Other Ambulatory Visit: Payer: Self-pay | Admitting: *Deleted

## 2010-11-22 DIAGNOSIS — E785 Hyperlipidemia, unspecified: Secondary | ICD-10-CM

## 2010-11-22 MED ORDER — ROSUVASTATIN CALCIUM 5 MG PO TABS
5.0000 mg | ORAL_TABLET | Freq: Every day | ORAL | Status: DC
Start: 2010-11-22 — End: 2011-11-22

## 2010-11-22 NOTE — Telephone Encounter (Signed)
Faxed request received from pharmacy for refill.  Pt states that he takes one tablet a day even though it is on his allergy list.  Medication was not on med list, but pt states he is taking it.  RX sent.

## 2010-11-23 ENCOUNTER — Other Ambulatory Visit: Payer: Self-pay

## 2010-11-23 MED ORDER — CIPROFLOXACIN HCL 500 MG PO TABS: 500.0000 mg | ORAL_TABLET | Freq: Two times a day (BID) | ORAL | Status: DC

## 2010-11-23 NOTE — Telephone Encounter (Signed)
OK to refill the Cipro once but if he is not improving by next week then he needs to come in for evaluation

## 2010-11-27 ENCOUNTER — Other Ambulatory Visit: Payer: Self-pay

## 2010-11-27 MED ORDER — MOMETASONE FUROATE 50 MCG/ACT NA SUSP: 2.0000 | Freq: Two times a day (BID) | NASAL | Status: DC

## 2010-11-30 ENCOUNTER — Other Ambulatory Visit (INDEPENDENT_AMBULATORY_CARE_PROVIDER_SITE_OTHER): Payer: Medicare Other | Admitting: *Deleted

## 2010-11-30 DIAGNOSIS — I4891 Unspecified atrial fibrillation: Secondary | ICD-10-CM

## 2010-11-30 DIAGNOSIS — I219 Acute myocardial infarction, unspecified: Secondary | ICD-10-CM

## 2010-11-30 DIAGNOSIS — I10 Essential (primary) hypertension: Secondary | ICD-10-CM

## 2010-11-30 DIAGNOSIS — E785 Hyperlipidemia, unspecified: Secondary | ICD-10-CM

## 2010-11-30 LAB — CBC WITH DIFFERENTIAL/PLATELET
Basophils Absolute: 0 10*3/uL (ref 0.0–0.1)
Basophils Relative: 0 % (ref 0–1)
Eosinophils Absolute: 0.2 10*3/uL (ref 0.0–0.7)
Eosinophils Relative: 2 % (ref 0–5)
Lymphs Abs: 1 10*3/uL (ref 0.7–4.0)
MCH: 30.4 pg (ref 26.0–34.0)
MCHC: 32 g/dL (ref 30.0–36.0)
MCV: 94.9 fL (ref 78.0–100.0)
Neutrophils Relative %: 80 % — ABNORMAL HIGH (ref 43–77)
Platelets: 254 10*3/uL (ref 150–400)
RDW: 13.6 % (ref 11.5–15.5)

## 2010-11-30 LAB — BASIC METABOLIC PANEL WITH GFR
CO2: 26 mEq/L (ref 19–32)
Calcium: 9.1 mg/dL (ref 8.4–10.5)
Creat: 1.44 mg/dL — ABNORMAL HIGH (ref 0.50–1.35)
GFR, Est African American: 58 mL/min — ABNORMAL LOW (ref >60)

## 2010-12-03 ENCOUNTER — Encounter: Payer: Self-pay | Admitting: Family Medicine

## 2010-12-03 ENCOUNTER — Ambulatory Visit (INDEPENDENT_AMBULATORY_CARE_PROVIDER_SITE_OTHER): Payer: Medicare Other | Admitting: Family Medicine

## 2010-12-03 VITALS — BP 108/70 | HR 84 | Ht 70.5 in | Wt 164.0 lb

## 2010-12-03 DIAGNOSIS — N50819 Testicular pain, unspecified: Secondary | ICD-10-CM

## 2010-12-03 DIAGNOSIS — N390 Urinary tract infection, site not specified: Secondary | ICD-10-CM

## 2010-12-03 DIAGNOSIS — N509 Disorder of male genital organs, unspecified: Secondary | ICD-10-CM

## 2010-12-03 LAB — POCT URINALYSIS DIPSTICK
Ketones, UA: NEGATIVE
Leukocytes, UA: NEGATIVE
Protein, UA: NEGATIVE
Urobilinogen, UA: 0.2
pH, UA: 7

## 2010-12-03 NOTE — Progress Notes (Signed)
OFFICE NOTE  12/03/2010  CC:  Chief Complaint  Patient presents with  . Follow-up     HPI:   Patient is a 75 y.o. Caucasian male who is here for f/u epididymo-orchitis, +klebsiella on urine clx. Took 2 wks of cipro and feels 95% better.  Still occasional achiness/soreness in right testicle area.   Swelling is down.  He used a jock strap and iced the area and found both helpful.  No longer requiring any percocet. At cardiologist f/u 11/20/10 his CMET and TSH were normal, INR was therapeutic at 2.8.  Pertinent PMH:  BPH, hx of low grade prostate cancer. DM 2, non insulin requiring. Hypothyroidism Bipolar d/o Ischemic cardiomyopathy A-fib, chronic coumadin therapy COPD  MEDS;   Outpatient Prescriptions Prior to Visit  Medication Sig Dispense Refill  . acetaminophen (TYLENOL) 325 MG tablet Take 325 mg by mouth as needed.        Marland Kitchen albuterol (PROVENTIL) (2.5 MG/3ML) 0.083% nebulizer solution Take 2.5 mg by nebulization every 6 (six) hours as needed.        Marland Kitchen amiodarone (PACERONE) 200 MG tablet Take 200 mg by mouth daily.        Marland Kitchen aspirin 81 MG tablet Take 81 mg by mouth daily.        Marland Kitchen BENICAR 20 MG tablet TAKE ONE-HALF TABLET BY MOUTH DAILY  30 tablet  2  . budesonide (PULMICORT) 0.25 MG/2ML nebulizer solution Take 0.25 mg by nebulization 2 (two) times daily.        . carvedilol (COREG) 6.25 MG tablet Take 6.25 mg by mouth 2 (two) times daily with a meal.        . cetirizine (ZYRTEC) 10 MG tablet Take 10 mg by mouth daily.        . clotrimazole-betamethasone (LOTRISONE) cream Apply topically 2 (two) times daily.        . cyanocobalamin 1000 MCG/ML injection Inject into the muscle every 30 (thirty) days.        Marland Kitchen desoximetasone (TOPICORT) 0.25 % cream Apply topically as needed.        . finasteride (PROSCAR) 5 MG tablet Take 5 mg by mouth daily.        . furosemide (LASIX) 40 MG tablet Take 40 mg by mouth 2 (two) times daily.        Marland Kitchen guaiFENesin-dextromethorphan (ROBITUSSIN DM)  100-10 MG/5ML syrup As needed for cough       . HYDROcodone-acetaminophen (NORCO) 5-325 MG per tablet 1-2 tabs po q6h prn pain  30 tablet  1  . ipratropium (ATROVENT) 0.03 % nasal spray Two sprays three times a day as needed before meals  30 mL  1  . isosorbide mononitrate (IMDUR) 60 MG 24 hr tablet Take 60 mg by mouth daily.        Marland Kitchen levalbuterol (XOPENEX HFA) 45 MCG/ACT inhaler Inhale 1-2 puffs into the lungs every 6 (six) hours as needed.  1 Inhaler  6  . levothyroxine (SYNTHROID, LEVOTHROID) 125 MCG tablet Take 125 mcg by mouth every other day.       . levothyroxine (SYNTHROID, LEVOTHROID) 125 MCG tablet Take 62.5 mcg by mouth every other day.        . lithium 300 MG capsule Take 300 mg by mouth 2 (two) times daily with meals.        . mometasone (NASONEX) 50 MCG/ACT nasal spray Place 2 sprays into the nose 2 (two) times daily.  17 g  0  . Multiple Vitamin (MULTIVITAMIN)  tablet Take 1 tablet by mouth daily.        . nitroGLYCERIN (NITROSTAT) 0.3 MG SL tablet Place 0.3 mg under the tongue every 5 (five) minutes as needed.        . NON FORMULARY 2 Act daily. Flutter Valve.       . NON FORMULARY as directed. Home nebulizer.       . potassium chloride (KLOR-CON) 10 MEQ CR tablet Take 10 mEq by mouth daily.        . rosuvastatin (CRESTOR) 5 MG tablet Take 1 tablet (5 mg total) by mouth at bedtime.  100 tablet  3  . sitaGLIPtan (JANUVIA) 100 MG tablet Take 100 mg by mouth daily.        . Tamsulosin HCl (FLOMAX) 0.4 MG CAPS Take 0.4 mg by mouth daily.        Marland Kitchen tiotropium (SPIRIVA) 18 MCG inhalation capsule Place 1 capsule (18 mcg total) into inhaler and inhale daily.  30 capsule  5  . warfarin (COUMADIN) 5 MG tablet Take 1 tablet (5mg ) by mouth on Friday.  Take 1/2 tablet (2.5mg ) all other days.      . ferrous sulfate 325 (65 FE) MG tablet Take 325 mg by mouth daily.       . ciprofloxacin (CIPRO) 500 MG tablet Take 1 tablet (500 mg total) by mouth 2 (two) times daily.  20 tablet  0    PE: Blood  pressure 108/70, pulse 84, height 5' 10.5" (1.791 m), weight 164 lb (74.39 kg), SpO2 100.00%. Gen: Alert, well appearing.  Patient is oriented to person, place, time, and situation. GU: minimal enlargement of right testicle, with subtle nodular fullness to posterosuperior portion of right testicle.  No erythema, no tenderness.  CC UA today: normal.  IMPRESSION AND PLAN:  Testicle pain Resolved epididymo-orchitis, UTI. Repeat UA today was normal.  Will send for clx for completeness. No new meds today.  Considering addition of regular daily prophylactic antibiotic if UTIs become more recurrent. Plan is to f/u 54mo, at which time we'll recheck HbA1c and cbc/iron labs.     FOLLOW UP:  Return in about 3 months (around 03/05/2011) for f/u DM, a-fib, and cardiomyopathy.

## 2010-12-03 NOTE — Assessment & Plan Note (Signed)
Resolved epididymo-orchitis, UTI. Repeat UA today was normal.  Will send for clx for completeness. No new meds today.  Considering addition of regular daily prophylactic antibiotic if UTIs become more recurrent. Plan is to f/u 31mo, at which time we'll recheck HbA1c and cbc/iron labs.

## 2010-12-04 ENCOUNTER — Other Ambulatory Visit: Payer: Self-pay | Admitting: *Deleted

## 2010-12-04 DIAGNOSIS — K219 Gastro-esophageal reflux disease without esophagitis: Secondary | ICD-10-CM

## 2010-12-04 DIAGNOSIS — E119 Type 2 diabetes mellitus without complications: Secondary | ICD-10-CM

## 2010-12-04 MED ORDER — SITAGLIPTIN PHOSPHATE 100 MG PO TABS: 100.0000 mg | ORAL_TABLET | Freq: Every day | ORAL | Status: DC

## 2010-12-04 MED ORDER — ESOMEPRAZOLE MAGNESIUM 40 MG PO CPDR: 40.0000 mg | DELAYED_RELEASE_CAPSULE | Freq: Every day | ORAL | Status: DC

## 2010-12-04 NOTE — Telephone Encounter (Signed)
Faxed request received from pharm for Janumet.  Follow up 02/2011.  RX sent until that time.

## 2010-12-04 NOTE — Telephone Encounter (Signed)
Addended by: Luisa Dago on: 12/04/2010 12:18 PM   Modules accepted: Orders

## 2010-12-04 NOTE — Telephone Encounter (Signed)
Faxed request received from pharm.  RX sent.

## 2010-12-05 ENCOUNTER — Other Ambulatory Visit: Payer: Self-pay | Admitting: *Deleted

## 2010-12-05 MED ORDER — FUROSEMIDE 40 MG PO TABS: 40.0000 mg | ORAL_TABLET | Freq: Two times a day (BID) | ORAL | Status: DC

## 2010-12-05 NOTE — Telephone Encounter (Signed)
Faxed request received from pharmacy.  RX sent.  Pt follow up in 02/2011.

## 2010-12-11 ENCOUNTER — Ambulatory Visit (INDEPENDENT_AMBULATORY_CARE_PROVIDER_SITE_OTHER): Payer: Medicare Other | Admitting: *Deleted

## 2010-12-11 DIAGNOSIS — I4891 Unspecified atrial fibrillation: Secondary | ICD-10-CM

## 2010-12-12 DIAGNOSIS — Z8679 Personal history of other diseases of the circulatory system: Secondary | ICD-10-CM | POA: Insufficient documentation

## 2010-12-12 DIAGNOSIS — I4892 Unspecified atrial flutter: Secondary | ICD-10-CM | POA: Insufficient documentation

## 2010-12-12 NOTE — Assessment & Plan Note (Addendum)
NSR on low dose amiodarone.  Have been concerned for a long time about his baseline pulmonary function, but other options are not very good.  He seemingly has tolerated.

## 2010-12-12 NOTE — Assessment & Plan Note (Signed)
See last note by Dr. Arbie Cookey.

## 2010-12-12 NOTE — Assessment & Plan Note (Addendum)
On appropriate medical therapy.  Has single chamber ICD.  QRS is not wide.

## 2010-12-12 NOTE — Assessment & Plan Note (Signed)
Being followed by primary care, and [facility].  [name] has retired, but I assume he will have visit there.

## 2010-12-12 NOTE — Assessment & Plan Note (Signed)
Has a fair amount of limitation, but not ongoing chest pain.  Weakness is main issue.  Last admission was in April as noted in the HPI.  He has been followed on a conservative medical course at the present time.  He has both thoracic and abdominal anuerysms, knows Dr. Arbie Cookey quite well with follow up.  His last cath was a year ago, with patent IMA and occluded grafts as noted.  Continued medical therapy has been recommended.

## 2010-12-12 NOTE — Progress Notes (Signed)
HPI:  He is in for follow up.  He has had a number of issues related to urinary bleeding, epididymitis, and UTI.  Has had course of Cipro, and also has been followed closely as an outpatient in the [facility] office.  This has required follow up also with coumadin because of the drug interaction.  His last cardiac admission was in April 2012 for non STEMI, which is summarized below:   HOSPITAL COURSE:  Roberto [name] is a 75 year old male with a history of   coronary artery disease.  He had chest pain on the day of admission and   when it did not resolve he came to the hospital where he was admitted   for further evaluation.      He had some elevation in his troponins up to 0.3, but his CKs all   remained negative.  His symptoms resolved on medical therapy.  He was   followed by Dr. Riley Kill and his medications were adjusted with an   increase in his Imdur.  He was seen by Cardiac Rehab and followed by   Pharmacy for his Coumadin.  On August 25, 2010, Roberto [name] had no   recurrent chest pain.  His vital signs were stable.  He had a mild   headache from the increased Imdur, but was tolerating it fairly well.   Dr. Eden Emms considered Roberto [name] stable for discharge, to follow up as   an outpatient.   His urinary issues have been the most major as of late.  No definite change in cardiac status.     Current Outpatient Prescriptions  Medication Sig Dispense Refill  . acetaminophen (TYLENOL) 325 MG tablet Take 325 mg by mouth as needed.        Marland Kitchen albuterol (PROVENTIL) (2.5 MG/3ML) 0.083% nebulizer solution Take 2.5 mg by nebulization every 6 (six) hours as needed.        Marland Kitchen amiodarone (PACERONE) 200 MG tablet Take 200 mg by mouth daily.        Marland Kitchen aspirin 81 MG tablet Take 81 mg by mouth daily.        Marland Kitchen BENICAR 20 MG tablet TAKE ONE-HALF TABLET BY MOUTH DAILY  30 tablet  2  . budesonide (PULMICORT) 0.25 MG/2ML nebulizer solution Take 0.25 mg by nebulization 2 (two) times daily.        . carvedilol  (COREG) 6.25 MG tablet Take 6.25 mg by mouth 2 (two) times daily with a meal.        . cetirizine (ZYRTEC) 10 MG tablet Take 10 mg by mouth daily.        . clotrimazole-betamethasone (LOTRISONE) cream Apply topically 2 (two) times daily.        . cyanocobalamin 1000 MCG/ML injection Inject into the muscle every 30 (thirty) days.        Marland Kitchen desoximetasone (TOPICORT) 0.25 % cream Apply topically as needed.        . ferrous sulfate 325 (65 FE) MG tablet Take 325 mg by mouth daily.       . finasteride (PROSCAR) 5 MG tablet Take 5 mg by mouth daily.        Marland Kitchen guaiFENesin-dextromethorphan (ROBITUSSIN DM) 100-10 MG/5ML syrup As needed for cough       . HYDROcodone-acetaminophen (NORCO) 5-325 MG per tablet 1-2 tabs po q6h prn pain  30 tablet  1  . ipratropium (ATROVENT) 0.03 % nasal spray Two sprays three times a day as needed before meals  30 mL  1  . isosorbide mononitrate (IMDUR) 60 MG 24 hr tablet Take 60 mg by mouth daily.        Marland Kitchen levalbuterol (XOPENEX HFA) 45 MCG/ACT inhaler Inhale 1-2 puffs into the lungs every 6 (six) hours as needed.  1 Inhaler  6  . levothyroxine (SYNTHROID, LEVOTHROID) 125 MCG tablet Take 125 mcg by mouth every other day.       . levothyroxine (SYNTHROID, LEVOTHROID) 125 MCG tablet Take 62.5 mcg by mouth every other day.        . lithium 300 MG capsule Take 300 mg by mouth 2 (two) times daily with meals.        . Multiple Vitamin (MULTIVITAMIN) tablet Take 1 tablet by mouth daily.        . nitroGLYCERIN (NITROSTAT) 0.3 MG SL tablet Place 0.3 mg under the tongue every 5 (five) minutes as needed.        . NON FORMULARY 2 Act daily. Flutter Valve.       . NON FORMULARY as directed. Home nebulizer.       . potassium chloride (KLOR-CON) 10 MEQ CR tablet Take 10 mEq by mouth daily.        . Tamsulosin HCl (FLOMAX) 0.4 MG CAPS Take 0.4 mg by mouth daily.        Marland Kitchen tiotropium (SPIRIVA) 18 MCG inhalation capsule Place 1 capsule (18 mcg total) into inhaler and inhale daily.  30 capsule  5   . warfarin (COUMADIN) 5 MG tablet Take 1 tablet (5mg ) by mouth on Friday.  Take 1/2 tablet (2.5mg ) all other days.      Marland Kitchen esomeprazole (NEXIUM) 40 MG capsule Take 1 capsule (40 mg total) by mouth daily.  30 capsule  11  . furosemide (LASIX) 40 MG tablet Take 1 tablet (40 mg total) by mouth 2 (two) times daily.  60 tablet  3  . mometasone (NASONEX) 50 MCG/ACT nasal spray Place 2 sprays into the nose 2 (two) times daily.  17 g  0  . rosuvastatin (CRESTOR) 5 MG tablet Take 1 tablet (5 mg total) by mouth at bedtime.  100 tablet  3  . sitaGLIPtin (JANUVIA) 100 MG tablet Take 1 tablet (100 mg total) by mouth daily.  30 tablet  3    Allergies  Allergen Reactions  . Atorvastatin     REACTION: muscle ache in legs  . Crestor (Rosuvastatin Calcium) Other (See Comments)    Lower ext fatigue and soreness  . Lisinopril Diarrhea  . Morphine     REACTION: AMS - agitation  . Prednisone     REACTION: Can't breath  . Zolpidem Tartrate     REACTION: AMS    Past Medical History  Diagnosis Date  . CAD (coronary artery disease)     post CABG with prior percutaneous intervention of teh saphenous vein graft with known saphenous vein graft disease.  Marland Kitchen AAA (abdominal aortic aneurysm) 2010    5.8cm  . Aneurysm of thoracic aorta 1995    Rupture 1995 w/spontaneous resolution  . Popliteal artery aneurysm   . Myocardial infarction   . Ischemic cardiomyopathy     EF 40%  . Paroxysmal atrial fibrillation   . Hypertension   . Peripheral vascular disease   . Dyslipidemia   . Diabetes mellitus 2007    dx'd 2007 w/persisting FBS>125  . GERD (gastroesophageal reflux disease)   . Hiatal hernia   . Diverticular disease   . BPH (benign prostatic hypertrophy)   . COPD (  chronic obstructive pulmonary disease)     05/09/08 PFT FEV1 1.84 (55%), FVC 4.42 (95%), TLC 6.46 (92%), DLCO 72%, no BD response  . Pneumonia 1/09  . Allergy   . Shingles   . OSA (obstructive sleep apnea) 06/14/09    PSG RDI 17, PLMI 96,  intolerant of CPAP or BPAP  . Chronic renal insufficiency     Baseline Cr 1.5  . Prostate cancer 07/2008    Low grade; watchful waiting with [name]  . Pulmonary nodule, left 03/02/2010    Left base; resolved on f/u CT  . Allergic rhinitis     Past Surgical History  Procedure Date  . Cardiac defibrillator placement 08/28/2004    [name]  . Colon surgery 1995    partial colectomy  . Coronary artery bypass graft 1996  . Aorta - bilateral femoral artery bypass graft 1999    [name] Early  . Nose surgery   . Insertion of pacing lead     New rate sensing pacing lead with removal of a previous implanted ICD and insertion of a device back in the pocket with defibrillation threshold testing  . Abdominal aortic aneurysm repair 1999    Ingrarenal repair/ graft  . Stents     Placed to the saphenous vein graft  . Radiofrequency ablation 2006    A flutter    Family History  Problem Relation Age of Onset  . Stroke Mother 41  . Heart attack Mother     CVA  . Cancer Father 61    Lung  . Heart attack Father 65  . Lung cancer Father   . Coronary artery disease Other     2 of 5 siblings with CAD    History   Social History  . Marital Status: Married    Spouse Name: N/A    Number of Children: N/A  . Years of Education: N/A   Occupational History  . RETIRED     Mechanic   Social History Main Topics  . Smoking status: Former Smoker -- 1.0 packs/day for 30 years    Types: Cigarettes    Quit date: 05/23/2003  . Smokeless tobacco: Former Neurosurgeon    Types: Chew    Quit date: 05/14/2003   Comment: 80 pack a year history  . Alcohol Use: 0.6 oz/week    1 Cans of beer per week     only occ  . Drug Use: No  . Sexually Active: Not on file   Other Topics Concern  . Not on file   Social History Narrative   [place] his wifePlays golf twice a week, but rides the cart to the ball, hits it, and then gets back in the cart    ROS: Please see the HPI.   All other systems reviewed and negative.  PHYSICAL EXAM:  BP 111/69  Pulse 72  Resp 18  Ht 5\' 11"  (1.803 m)  Wt 164 lb 1.9 oz (74.444 kg)  BMI 22.89 kg/m2  General: Well developed, well nourished, in no acute distress. Head:  Normocephalic and atraumatic. Neck: no JVD Lungs: Clear to auscultation and percussion. Heart: Normal S1 and S2.  No murmur, rubs or gallops.  Abdomen:  Normal bowel sounds; soft; non tender; no organomegaly Pulses: Pulses normal in all 4 extremities. Extremities: No clubbing or cyanosis. No edema. Neurologic: Alert and oriented x 3.  EKG:  NSR.  Anterior MI, old.  Left axis deviation.  Rare PVCs.  Nonspecific  ST and T wave changes.    ASSESSMENT AND PLAN:

## 2010-12-12 NOTE — Assessment & Plan Note (Signed)
Currently under control.  Has ischemic cardiomyopathy on multiple meds.

## 2010-12-13 ENCOUNTER — Telehealth: Payer: Self-pay | Admitting: Cardiology

## 2010-12-13 NOTE — Telephone Encounter (Signed)
Per Dr. Riley Kill, patient may hold coumadin 3-4 days prior to extraction.

## 2010-12-13 NOTE — Telephone Encounter (Signed)
Patient needs to have a tooth extraction. Will discuss with Dr. Riley Kill in regards to holding Coumadin prior to his appointment.

## 2010-12-13 NOTE — Telephone Encounter (Signed)
Pt needs to have a tooth pulled and he needs to know when he can stop his coumadin and how long to be off of it

## 2010-12-17 ENCOUNTER — Ambulatory Visit: Payer: Medicare Other | Admitting: Pulmonary Disease

## 2010-12-25 ENCOUNTER — Ambulatory Visit (INDEPENDENT_AMBULATORY_CARE_PROVIDER_SITE_OTHER): Payer: Medicare Other | Admitting: *Deleted

## 2010-12-25 DIAGNOSIS — I4891 Unspecified atrial fibrillation: Secondary | ICD-10-CM

## 2010-12-25 LAB — POCT INR: INR: 1.6

## 2010-12-27 ENCOUNTER — Ambulatory Visit (INDEPENDENT_AMBULATORY_CARE_PROVIDER_SITE_OTHER): Payer: Medicare Other | Admitting: Pulmonary Disease

## 2010-12-27 ENCOUNTER — Encounter: Payer: Self-pay | Admitting: Pulmonary Disease

## 2010-12-27 DIAGNOSIS — R490 Dysphonia: Secondary | ICD-10-CM

## 2010-12-27 DIAGNOSIS — J310 Chronic rhinitis: Secondary | ICD-10-CM

## 2010-12-27 DIAGNOSIS — J449 Chronic obstructive pulmonary disease, unspecified: Secondary | ICD-10-CM

## 2010-12-27 NOTE — Assessment & Plan Note (Signed)
Stable on current regimen   

## 2010-12-27 NOTE — Patient Instructions (Signed)
Follow up in 6 months 

## 2010-12-27 NOTE — Assessment & Plan Note (Signed)
Improved

## 2010-12-27 NOTE — Assessment & Plan Note (Signed)
Improved since started prn atrovent nasal spray.

## 2010-12-27 NOTE — Progress Notes (Signed)
Subjective:    Patient ID: Roberto [name], male    DOB: 10/08/1935, 75 y.o.   MRN: [id number]  HPI CC: Gretta Began  75 yo with known history of COPD, and rhinitis  His rhinitis has improved with atrovent.  He is not having as much trouble with hoarseness.  His breathing is doing okay.  He does not have much cough, wheeze, or sputum.  He has been walking 1 mile per day.  He had CT chest/abd for his aneurysm, but was told he is not a surgical candidate due to his heart problems.  Past Medical History  Diagnosis Date  . CAD (coronary artery disease)     post CABG with prior percutaneous intervention of teh saphenous vein graft with known saphenous vein graft disease.  Marland Kitchen AAA (abdominal aortic aneurysm) 2010    5.8cm  . Aneurysm of thoracic aorta 1995    Rupture 1995 w/spontaneous resolution  . Popliteal artery aneurysm   . Myocardial infarction   . Ischemic cardiomyopathy     EF 40%  . Paroxysmal atrial fibrillation   . Hypertension   . Peripheral vascular disease   . Dyslipidemia   . Diabetes mellitus 2007    dx'd 2007 w/persisting FBS>125  . GERD (gastroesophageal reflux disease)   . Hiatal hernia   . Diverticular disease   . BPH (benign prostatic hypertrophy)   . COPD (chronic obstructive pulmonary disease)     05/09/08 PFT FEV1 1.84 (55%), FVC 4.42 (95%), TLC 6.46 (92%), DLCO 72%, no BD response  . Pneumonia 1/09  . Allergy   . Shingles   . OSA (obstructive sleep apnea) 06/14/09    PSG RDI 17, PLMI 96, intolerant of CPAP or BPAP  . Chronic renal insufficiency     Baseline Cr 1.5  . Prostate cancer 07/2008    Low grade; watchful waiting with [name]  . Pulmonary nodule, left 03/02/2010    Left base; resolved on f/u CT  . Allergic rhinitis     Review of Systems     Objective:   Physical Exam BP 102/56  Pulse 59  Temp(Src) 97.8 F (36.6 C) (Oral)  Ht 5' 11.5" (1.816 m)  Wt 168 lb 6.4 oz (76.386 kg)  BMI 23.16 kg/m2  SpO2 98%  General - thin, no distress    HEENT - No sinus tenderness, clear nasal drainage, no oral exudate, no LAN  Cardiac - s1s2  no murmur  Chest - diminished breath sounds, no wheeze/rales  Abd - soft, nontender  Ext - no edema  Neuro - normal strength, CN intact, A&O x 3  Psych - normal behavior/mood  Skin - multiple ecchysmoses     Assessment & Plan:   COPD Stable on current regimen.  Gustatory rhinitis Improved since started prn atrovent nasal spray.  HOARSENESS Improved.    Updated Medication List Outpatient Encounter Prescriptions as of 12/27/2010  Medication Sig Dispense Refill  . acetaminophen (TYLENOL) 325 MG tablet Take 325 mg by mouth as needed.        Marland Kitchen albuterol (PROVENTIL) (2.5 MG/3ML) 0.083% nebulizer solution Take 2.5 mg by nebulization every 6 (six) hours as needed.        Marland Kitchen amiodarone (PACERONE) 200 MG tablet Take 200 mg by mouth daily.        Marland Kitchen aspirin 81 MG tablet Take 81 mg by mouth daily.        Marland Kitchen BENICAR 20 MG tablet TAKE ONE-HALF TABLET BY MOUTH DAILY  30 tablet  2  . budesonide (PULMICORT) 0.25 MG/2ML nebulizer solution Take 0.25 mg by nebulization 2 (two) times daily.        . carvedilol (COREG) 6.25 MG tablet Take 6.25 mg by mouth 2 (two) times daily with a meal.        . cetirizine (ZYRTEC) 10 MG tablet Take 10 mg by mouth daily.        . clotrimazole-betamethasone (LOTRISONE) cream Apply topically 2 (two) times daily.        . cyanocobalamin 1000 MCG/ML injection Inject into the muscle every 30 (thirty) days.        Marland Kitchen desoximetasone (TOPICORT) 0.25 % cream Apply topically as needed.        Marland Kitchen esomeprazole (NEXIUM) 40 MG capsule Take 1 capsule (40 mg total) by mouth daily.  30 capsule  11  . ferrous sulfate 325 (65 FE) MG tablet Take 325 mg by mouth daily.       . finasteride (PROSCAR) 5 MG tablet Take 5 mg by mouth daily.        . furosemide (LASIX) 40 MG tablet Take 1 tablet (40 mg total) by mouth 2 (two) times daily.  60 tablet  3  . guaiFENesin-dextromethorphan (ROBITUSSIN DM)  100-10 MG/5ML syrup As needed for cough       . HYDROcodone-acetaminophen (NORCO) 5-325 MG per tablet 1-2 tabs po q6h prn pain  30 tablet  1  . ipratropium (ATROVENT) 0.03 % nasal spray Two sprays three times a day as needed before meals  30 mL  1  . isosorbide mononitrate (IMDUR) 60 MG 24 hr tablet Take 60 mg by mouth daily.        Marland Kitchen levalbuterol (XOPENEX HFA) 45 MCG/ACT inhaler Inhale 1-2 puffs into the lungs every 6 (six) hours as needed.  1 Inhaler  6  . levothyroxine (SYNTHROID, LEVOTHROID) 125 MCG tablet Take 125 mcg by mouth every other day.       . levothyroxine (SYNTHROID, LEVOTHROID) 125 MCG tablet Take 62.5 mcg by mouth every other day.        . lithium 300 MG capsule Take 300 mg by mouth 2 (two) times daily with meals.        . nitroGLYCERIN (NITROSTAT) 0.3 MG SL tablet Place 0.3 mg under the tongue every 5 (five) minutes as needed.        . NON FORMULARY 2 Act daily. Flutter Valve.       . NON FORMULARY as directed. Home nebulizer.       . potassium chloride (KLOR-CON) 10 MEQ CR tablet Take 10 mEq by mouth daily.        . rosuvastatin (CRESTOR) 5 MG tablet Take 5 mg by mouth at bedtime. Once a week       . sitaGLIPtin (JANUVIA) 100 MG tablet Take 1 tablet (100 mg total) by mouth daily.  30 tablet  3  . Tamsulosin HCl (FLOMAX) 0.4 MG CAPS Take 0.4 mg by mouth daily.        Marland Kitchen tiotropium (SPIRIVA) 18 MCG inhalation capsule Place 1 capsule (18 mcg total) into inhaler and inhale daily.  30 capsule  5  . warfarin (COUMADIN) 5 MG tablet Take 1 tablet (5mg ) by mouth on Friday.  Take 1/2 tablet (2.5mg ) all other days.      Marland Kitchen DISCONTD: rosuvastatin (CRESTOR) 5 MG tablet Take 1 tablet (5 mg total) by mouth at bedtime.  100 tablet  3  . mometasone (NASONEX) 50 MCG/ACT nasal spray Place 2  sprays into the nose 2 (two) times daily.  17 g  0  . DISCONTD: Multiple Vitamin (MULTIVITAMIN) tablet Take 1 tablet by mouth daily.

## 2011-01-01 ENCOUNTER — Ambulatory Visit (INDEPENDENT_AMBULATORY_CARE_PROVIDER_SITE_OTHER): Payer: Medicare Other | Admitting: Family Medicine

## 2011-01-01 ENCOUNTER — Encounter: Payer: Self-pay | Admitting: Family Medicine

## 2011-01-01 VITALS — BP 84/56 | HR 62 | Temp 97.6°F | Wt 163.0 lb

## 2011-01-01 DIAGNOSIS — M545 Low back pain: Secondary | ICD-10-CM

## 2011-01-01 DIAGNOSIS — G4700 Insomnia, unspecified: Secondary | ICD-10-CM

## 2011-01-01 DIAGNOSIS — M25512 Pain in left shoulder: Secondary | ICD-10-CM

## 2011-01-01 DIAGNOSIS — M25519 Pain in unspecified shoulder: Secondary | ICD-10-CM

## 2011-01-01 MED ORDER — TRAZODONE HCL 50 MG PO TABS: ORAL_TABLET | ORAL | Status: DC

## 2011-01-01 NOTE — Assessment & Plan Note (Signed)
Chronic. Discussed behavioral changes, mainly avoiding nap in daytime and trying to stay up a bit later at night. Will do trial of trazodone 50mg , 1-2 qhs prn.  Therapeutic expectations and side effect profile of medication discussed today.  Patient's questions answered.

## 2011-01-01 NOTE — Assessment & Plan Note (Signed)
Muscle pain plus rotator cuff impingement--mild/acute. Discussed relative rest, ROM exercises, voltaren gel use qid (has some samples at home). Heat prn. Recheck in about 3 wks.

## 2011-01-01 NOTE — Assessment & Plan Note (Signed)
Myofascial pain/strain. Encouraged him to stay as active as possible, do ROM exercises, apply heat, massage, tylenol 1000mg  q6h prn. Recheck 3 wks.

## 2011-01-01 NOTE — Progress Notes (Signed)
OFFICE NOTE  01/01/2011  CC:  Chief Complaint  Patient presents with  . Back Pain    left side, right shoulder pain also x 2-3 days     HPI:   Patient is a 75 y.o. Caucasian male who is here for insomnia, left shoulder pain, and left lower back pain. Insomnia is chronic, seems to be worse lately.  Says he commonly takes a nap in daytime, usually goes to bed at 9pm and is up by 2pm and cannot sleep anymore.  Has OSA but couldn't tolerate CPAP.  Also has 3-4 day history of left lower back pain extending up to mid back area a bit.  Worse with movement/walking, better when he takes tylenol. No rash.  No recent trauma/strain.  He has started walking a mile once a day lately and this may have brought his pain on. No abd pain, no n/v, no appetite loss.  No dizziness.  No LE weakness or pain.  No hematuria, groin pain, dysuria, or straining with urination.  Around the same time as back pain onset, he began to feel left shoulder pain, worse with overhead motion, worse when lying on it. No weakness, no radiation into arm or neck.  Feels like sometimes in trapezius mm area and sometimes in shoulder joint. No prior trauma or strain.  Has hx of rotator cuff tendonopathy on right and says PT helped.  Says his left shoulder had to be injected years ago (decades?) and he is apprehensive about possibly getting this done again.  Pertinent PMH:  Thoracic aortic aneurism. Hx of AAA repair. Paroxysmal a-fib, chronic anticoag w/coumadin and low dose ASA Ischemic cardiomyopathy-has ICD COPD OSA--couldn't tolerate CPAP Hx of prostate cancer (low grade) DM 2, non insulin-requiring  MEDS;   Outpatient Prescriptions Prior to Visit  Medication Sig Dispense Refill  . acetaminophen (TYLENOL) 325 MG tablet Take 325 mg by mouth as needed.        Marland Kitchen albuterol (PROVENTIL) (2.5 MG/3ML) 0.083% nebulizer solution Take 2.5 mg by nebulization every 6 (six) hours as needed.        Marland Kitchen amiodarone (PACERONE) 200 MG tablet  Take 200 mg by mouth daily.        Marland Kitchen aspirin 81 MG tablet Take 81 mg by mouth daily.        Marland Kitchen BENICAR 20 MG tablet TAKE ONE-HALF TABLET BY MOUTH DAILY  30 tablet  2  . budesonide (PULMICORT) 0.25 MG/2ML nebulizer solution Take 0.25 mg by nebulization 2 (two) times daily.        . carvedilol (COREG) 6.25 MG tablet Take 6.25 mg by mouth 2 (two) times daily with a meal.        . cetirizine (ZYRTEC) 10 MG tablet Take 10 mg by mouth daily.        . clotrimazole-betamethasone (LOTRISONE) cream Apply topically 2 (two) times daily.        . cyanocobalamin 1000 MCG/ML injection Inject into the muscle every 30 (thirty) days.        Marland Kitchen desoximetasone (TOPICORT) 0.25 % cream Apply topically as needed.        Marland Kitchen esomeprazole (NEXIUM) 40 MG capsule Take 1 capsule (40 mg total) by mouth daily.  30 capsule  11  . ferrous sulfate 325 (65 FE) MG tablet Take 325 mg by mouth daily.       . finasteride (PROSCAR) 5 MG tablet Take 5 mg by mouth daily.        . furosemide (LASIX) 40 MG  tablet Take 1 tablet (40 mg total) by mouth 2 (two) times daily.  60 tablet  3  . guaiFENesin-dextromethorphan (ROBITUSSIN DM) 100-10 MG/5ML syrup As needed for cough       . HYDROcodone-acetaminophen (NORCO) 5-325 MG per tablet 1-2 tabs po q6h prn pain  30 tablet  1  . ipratropium (ATROVENT) 0.03 % nasal spray Two sprays three times a day as needed before meals  30 mL  1  . isosorbide mononitrate (IMDUR) 60 MG 24 hr tablet Take 60 mg by mouth daily.        Marland Kitchen levalbuterol (XOPENEX HFA) 45 MCG/ACT inhaler Inhale 1-2 puffs into the lungs every 6 (six) hours as needed.  1 Inhaler  6  . levothyroxine (SYNTHROID, LEVOTHROID) 125 MCG tablet Take 125 mcg by mouth every other day.       . levothyroxine (SYNTHROID, LEVOTHROID) 125 MCG tablet Take 62.5 mcg by mouth every other day.        . lithium 300 MG capsule Take 300 mg by mouth 2 (two) times daily with meals.        . mometasone (NASONEX) 50 MCG/ACT nasal spray Place 2 sprays into the nose 2  (two) times daily.  17 g  0  . nitroGLYCERIN (NITROSTAT) 0.3 MG SL tablet Place 0.3 mg under the tongue every 5 (five) minutes as needed.        . NON FORMULARY 2 Act daily. Flutter Valve.       . NON FORMULARY as directed. Home nebulizer.       . potassium chloride (KLOR-CON) 10 MEQ CR tablet Take 10 mEq by mouth daily.        . rosuvastatin (CRESTOR) 5 MG tablet Take 5 mg by mouth at bedtime. Once a week       . sitaGLIPtin (JANUVIA) 100 MG tablet Take 1 tablet (100 mg total) by mouth daily.  30 tablet  3  . Tamsulosin HCl (FLOMAX) 0.4 MG CAPS Take 0.4 mg by mouth daily.        Marland Kitchen tiotropium (SPIRIVA) 18 MCG inhalation capsule Place 1 capsule (18 mcg total) into inhaler and inhale daily.  30 capsule  5  . warfarin (COUMADIN) 5 MG tablet Take 1 tablet (5mg ) by mouth on Friday.  Take 1/2 tablet (2.5mg ) all other days.        PE: Blood pressure 84/56, pulse 62, temperature 97.6 F (36.4 C), temperature source Oral, weight 163 lb (73.936 kg), SpO2 96.00%. Gen: Alert, well appearing.  Patient is oriented to person, place, time, and situation.  Pleasant affect, lucid thought and conversation. No pallor.   CV: RRR LUNGS: CTA bilat, nonlabored resps. ABD: soft, NT, ND, BS normal.  NO pulsatile mass, no bruit. EXT: no clubbing, cyanosis, or edema.  Neck: nontender, ROM minimally limited in all movements.  Left trapezius area between c-spine and acromion is mildly TTP, with additional mild TTP under acromion laterally and posteriorly.  No significant pain with resisted ER/IR/flexion/extension of shoulders.  Abduction of left shoulder brings mild pain and he can only raise it to a bit over 90 degrees.  No AC joint TTP.  Negative drop sign test.  DTRs in UEs trace bilat. Back: no TTP in soft tissues where he says he feels pain.  No bruising or rash.  No midline TTP.  ROM of L-spine intact, no weakness in LEs, no sensory deficits.     IMPRESSION AND PLAN:  Insomnia Chronic. Discussed behavioral  changes, mainly avoiding nap  in daytime and trying to stay up a bit later at night. Will do trial of trazodone 50mg , 1-2 qhs prn.  Therapeutic expectations and side effect profile of medication discussed today.  Patient's questions answered.   Shoulder pain, left Muscle pain plus rotator cuff impingement--mild/acute. Discussed relative rest, ROM exercises, voltaren gel use qid (has some samples at home). Heat prn. Recheck in about 3 wks.  Low back pain Myofascial pain/strain. Encouraged him to stay as active as possible, do ROM exercises, apply heat, massage, tylenol 1000mg  q6h prn. Recheck 3 wks.     FOLLOW UP:  Return in about 3 weeks (around 01/22/2011) for back pain, shoulder pain, insomnia.

## 2011-01-04 ENCOUNTER — Other Ambulatory Visit: Payer: Self-pay | Admitting: Cardiology

## 2011-01-07 ENCOUNTER — Ambulatory Visit (INDEPENDENT_AMBULATORY_CARE_PROVIDER_SITE_OTHER): Payer: Medicare Other | Admitting: Cardiology

## 2011-01-07 ENCOUNTER — Encounter: Payer: Self-pay | Admitting: Cardiology

## 2011-01-07 ENCOUNTER — Ambulatory Visit (INDEPENDENT_AMBULATORY_CARE_PROVIDER_SITE_OTHER): Payer: Medicare Other | Admitting: *Deleted

## 2011-01-07 VITALS — BP 102/60 | HR 55 | Ht 71.0 in | Wt 165.8 lb

## 2011-01-07 DIAGNOSIS — I714 Abdominal aortic aneurysm, without rupture, unspecified: Secondary | ICD-10-CM

## 2011-01-07 DIAGNOSIS — I2510 Atherosclerotic heart disease of native coronary artery without angina pectoris: Secondary | ICD-10-CM

## 2011-01-07 DIAGNOSIS — I25810 Atherosclerosis of coronary artery bypass graft(s) without angina pectoris: Secondary | ICD-10-CM

## 2011-01-07 DIAGNOSIS — I4891 Unspecified atrial fibrillation: Secondary | ICD-10-CM

## 2011-01-07 DIAGNOSIS — E785 Hyperlipidemia, unspecified: Secondary | ICD-10-CM

## 2011-01-07 DIAGNOSIS — I2589 Other forms of chronic ischemic heart disease: Secondary | ICD-10-CM

## 2011-01-07 LAB — POCT INR: INR: 3.1

## 2011-01-07 NOTE — Assessment & Plan Note (Signed)
Hemodynamics are stable.  Continue current medications.  Check BMET.

## 2011-01-07 NOTE — Assessment & Plan Note (Signed)
Patient followed by Dr. Arbie Cookey.

## 2011-01-07 NOTE — Patient Instructions (Signed)
Your physician recommends that you schedule a follow-up appointment in: 3 months.  

## 2011-01-07 NOTE — Assessment & Plan Note (Signed)
Poorly tolerant of meds.  Followed by Dr. Oneta Rack

## 2011-01-07 NOTE — Assessment & Plan Note (Signed)
Maintaining NSR.  Amiodarone labs ok.  Has small tremor in the hands which likely seems to be from this.

## 2011-01-07 NOTE — Assessment & Plan Note (Signed)
Stable at present time.  No recurrent symptoms at the present time.

## 2011-01-07 NOTE — Progress Notes (Signed)
HPI:  Overall stable.  No chest pain.  Shoulder bothering him, especially when he raises his arm.  Similar to his rotator cuff problem on the left.  PT resolved those symptoms.  No shortness of breath.  Recent labs much better.  He is doing well overall.  Supposed to see Dr. Arbie Cookey fairly soon.  He has a little bit of a tremor, and he also some pain in his legs bilaterally when he walks.  Otherwise stable.   Current Outpatient Prescriptions  Medication Sig Dispense Refill  . acetaminophen (TYLENOL) 325 MG tablet Take 325 mg by mouth as needed.        Marland Kitchen albuterol (PROVENTIL) (2.5 MG/3ML) 0.083% nebulizer solution Take 2.5 mg by nebulization every 6 (six) hours as needed.        Marland Kitchen amiodarone (PACERONE) 200 MG tablet Take 200 mg by mouth daily.        Marland Kitchen aspirin 81 MG tablet Take 81 mg by mouth daily.        Marland Kitchen BENICAR 20 MG tablet TAKE ONE-HALF TABLET BY MOUTH DAILY  30 tablet  2  . budesonide (PULMICORT) 0.25 MG/2ML nebulizer solution Take 0.25 mg by nebulization 2 (two) times daily.        . carvedilol (COREG) 6.25 MG tablet Take 6.25 mg by mouth 2 (two) times daily with a meal.        . cetirizine (ZYRTEC) 10 MG tablet Take 10 mg by mouth daily.        . clotrimazole-betamethasone (LOTRISONE) cream Apply topically as needed.       . cyanocobalamin 1000 MCG/ML injection Inject into the muscle every 30 (thirty) days.        Marland Kitchen desoximetasone (TOPICORT) 0.25 % cream Apply topically as needed.        Marland Kitchen esomeprazole (NEXIUM) 40 MG capsule Take 1 capsule (40 mg total) by mouth daily.  30 capsule  11  . finasteride (PROSCAR) 5 MG tablet Take 5 mg by mouth daily.        . furosemide (LASIX) 40 MG tablet Take 1 tablet (40 mg total) by mouth 2 (two) times daily.  60 tablet  3  . guaiFENesin-dextromethorphan (ROBITUSSIN DM) 100-10 MG/5ML syrup As needed for cough       . HYDROcodone-acetaminophen (NORCO) 5-325 MG per tablet 1-2 tabs po q6h prn pain  30 tablet  1  . ipratropium (ATROVENT) 0.03 % nasal spray Two  sprays three times a day as needed before meals  30 mL  1  . isosorbide mononitrate (IMDUR) 60 MG 24 hr tablet Take 60 mg by mouth daily.        Marland Kitchen levalbuterol (XOPENEX HFA) 45 MCG/ACT inhaler Inhale 1-2 puffs into the lungs every 6 (six) hours as needed.  1 Inhaler  6  . levothyroxine (SYNTHROID, LEVOTHROID) 125 MCG tablet Take 125 mcg by mouth every other day.       . levothyroxine (SYNTHROID, LEVOTHROID) 125 MCG tablet Take 62.5 mcg by mouth every other day.        . lithium 300 MG capsule Take 300 mg by mouth 2 (two) times daily with a meal.        . mometasone (NASONEX) 50 MCG/ACT nasal spray Place 2 sprays into the nose 2 (two) times daily.  17 g  0  . nitroGLYCERIN (NITROSTAT) 0.3 MG SL tablet Place 0.3 mg under the tongue every 5 (five) minutes as needed.        . NON FORMULARY  2 Act daily. Flutter Valve.       . NON FORMULARY as directed. Home nebulizer.       . potassium chloride (KLOR-CON) 10 MEQ CR tablet Take 10 mEq by mouth daily.        . rosuvastatin (CRESTOR) 5 MG tablet Take 5 mg by mouth once a week. Once a week      . sitaGLIPtin (JANUVIA) 100 MG tablet Take 1 tablet (100 mg total) by mouth daily.  30 tablet  3  . Tamsulosin HCl (FLOMAX) 0.4 MG CAPS Take 0.4 mg by mouth daily.        Marland Kitchen tiotropium (SPIRIVA) 18 MCG inhalation capsule Place 1 capsule (18 mcg total) into inhaler and inhale daily.  30 capsule  5  . traZODone (DESYREL) 50 MG tablet 1-2 tabs po qhs  30 tablet  1  . warfarin (COUMADIN) 5 MG tablet TAKE AS DIRECTED PER COUMADIN CLINIC  30 tablet  3    Allergies  Allergen Reactions  . Atorvastatin     REACTION: muscle ache in legs  . Crestor (Rosuvastatin Calcium) Other (See Comments)    Lower ext fatigue and soreness  . Lisinopril Diarrhea  . Morphine     REACTION: AMS - agitation  . Prednisone     REACTION: Can't breath  . Zolpidem Tartrate     REACTION: AMS    Past Medical History  Diagnosis Date  . CAD (coronary artery disease)     post CABG with  prior percutaneous intervention of teh saphenous vein graft with known saphenous vein graft disease.  Marland Kitchen AAA (abdominal aortic aneurysm) 2010    5.8cm  . Aneurysm of thoracic aorta 1995    Rupture 1995 w/spontaneous resolution  . Popliteal artery aneurysm   . Myocardial infarction   . Ischemic cardiomyopathy     EF 40%  . Paroxysmal atrial fibrillation   . Hypertension   . Peripheral vascular disease   . Dyslipidemia   . Diabetes mellitus 2007    dx'd 2007 w/persisting FBS>125  . GERD (gastroesophageal reflux disease)   . Hiatal hernia   . Diverticular disease   . BPH (benign prostatic hypertrophy)   . COPD (chronic obstructive pulmonary disease)     05/09/08 PFT FEV1 1.84 (55%), FVC 4.42 (95%), TLC 6.46 (92%), DLCO 72%, no BD response  . Pneumonia 1/09  . Allergy   . Shingles   . OSA (obstructive sleep apnea) 06/14/09    PSG RDI 17, PLMI 96, intolerant of CPAP or BPAP  . Chronic renal insufficiency     Baseline Cr 1.5  . Prostate cancer 07/2008    Low grade; watchful waiting with [name]  . Pulmonary nodule, left 03/02/2010    Left base; resolved on f/u CT  . Allergic rhinitis     Past Surgical History  Procedure Date  . Cardiac defibrillator placement 08/28/2004    [name]  . Colon surgery 1995    partial colectomy  . Coronary artery bypass graft 1996  . Aorta - bilateral femoral artery bypass graft 1999    [name] Early  . Nose surgery   . Insertion of pacing lead     New rate sensing pacing lead with removal of a previous implanted ICD and insertion of a device back in the pocket with defibrillation threshold testing  . Abdominal aortic aneurysm repair 1999    Ingrarenal repair/ graft  . Stents     Placed to the saphenous vein graft  .  Radiofrequency ablation 2006    A flutter    Family History  Problem Relation Age of Onset  . Stroke Mother 48  . Heart attack Mother     CVA  . Cancer Father 65    Lung  . Heart attack Father 59  . Lung  cancer Father   . Coronary artery disease Other     2 of 5 siblings with CAD    History   Social History  . Marital Status: Married    Spouse Name: N/A    Number of Children: N/A  . Years of Education: N/A   Occupational History  . RETIRED     Mechanic   Social History Main Topics  . Smoking status: Former Smoker -- 1.0 packs/day for 30 years    Types: Cigarettes    Quit date: 05/23/2003  . Smokeless tobacco: Former Neurosurgeon    Types: Chew    Quit date: 05/14/2003   Comment: 80 pack a year history  . Alcohol Use: 0.6 oz/week    1 Cans of beer per week     only occ  . Drug Use: No  . Sexually Active: Not on file   Other Topics Concern  . Not on file   Social History Narrative   [place] with his wifePlays golf twice a week, but rides the cart to the ball, hits it, and then gets back in the cart    ROS: Please see the HPI.  All other systems reviewed and negative.  PHYSICAL EXAM:  BP 102/60  Pulse 55  Ht 5\' 11"  (1.803 m)  Wt 165 lb 12.8 oz (75.206 kg)  BMI 23.12 kg/m2  General: Well developed, well nourished, in no acute distress. Head:  Normocephalic and atraumatic. Neck: no JVD Lungs: Clear to auscultation and percussion. Heart: Normal S1 and S2.  No murmur, rubs or gallops.  Abdomen:  Normal bowel sounds; soft; non tender; no organomegaly.  Cannot feel AAA.   Pulses:present but diminished in the feet.   Extremities: No clubbing or cyanosis. No edema. Neurologic: Alert and oriented x 3.  EKG:  SB/  LAFB.  Nonspecific ST and T wave changes.  ASSESSMENT AND PLAN:

## 2011-01-08 LAB — BASIC METABOLIC PANEL
CO2: 29 mEq/L (ref 19–32)
Chloride: 103 mEq/L (ref 96–112)
Potassium: 4 mEq/L (ref 3.5–5.1)

## 2011-01-10 ENCOUNTER — Ambulatory Visit (INDEPENDENT_AMBULATORY_CARE_PROVIDER_SITE_OTHER): Payer: Medicare Other | Admitting: Family Medicine

## 2011-01-10 ENCOUNTER — Telehealth: Payer: Self-pay | Admitting: Cardiology

## 2011-01-10 ENCOUNTER — Encounter: Payer: Self-pay | Admitting: Family Medicine

## 2011-01-10 VITALS — BP 109/62 | HR 53 | Temp 97.9°F | Ht 70.5 in | Wt 168.0 lb

## 2011-01-10 DIAGNOSIS — M25512 Pain in left shoulder: Secondary | ICD-10-CM

## 2011-01-10 DIAGNOSIS — M25519 Pain in unspecified shoulder: Secondary | ICD-10-CM

## 2011-01-10 DIAGNOSIS — I2589 Other forms of chronic ischemic heart disease: Secondary | ICD-10-CM

## 2011-01-10 MED ORDER — METHYLPREDNISOLONE ACETATE 40 MG/ML IJ SUSP
40.0000 mg | Freq: Once | INTRAMUSCULAR | Status: AC
  Administered 2011-01-10: 40 mg via INTRA_ARTICULAR

## 2011-01-10 NOTE — Telephone Encounter (Signed)
Order put in for BMP.

## 2011-01-10 NOTE — Telephone Encounter (Signed)
Patient aware of lab results.

## 2011-01-10 NOTE — Telephone Encounter (Signed)
Pt returning your call. Pt wants to talk to whitney.

## 2011-01-11 ENCOUNTER — Encounter: Payer: Self-pay | Admitting: Family Medicine

## 2011-01-11 NOTE — Progress Notes (Signed)
OFFICE NOTE  01/11/2011  CC:  Chief Complaint  Patient presents with  . Shoulder Pain    left shoulder/ injection     HPI: Patient is a 75 y.o. Caucasian male who is here for ongoing left shoulder pain. About 2 wks now of constant moderate pain, worse with w/abduction.  Tip of shoulder and over deltoid are mostly where pain is. No weakness, no numbness or tingling.  He wants steroid joint injection.  Pertinent PMH:  No prior shoulder injury or procedures/surgeries. Chronic med problems/PMH/PSH all reviewed--no changes today. Pertinent Meds: Tylenol 650 mg 1-2 times a day.  Other chronic meds reviewed in chart--no changes since last f/u.  PE: Blood pressure 109/62, pulse 53, temperature 97.9 F (36.6 C), temperature source Oral, height 5' 10.5" (1.791 m), weight 168 lb (76.204 kg), SpO2 94.00%. Gen: Alert, well appearing.  Patient is oriented to person, place, time, and situation. Left shoulder: TTP under hook of acromion and minimally over left AC joint.  ROM intact but very stiff and painful when abduction goes beyond 90 degrees on left arm.  ER/IR not painful.  No weakness or sensory deficit. DTRs 2+ in triceps and biceps areas bilat.  Drop sign neg, empty can sign neg.    IMPRESSION AND PLAN: Left rotator cuff tendonitis.  Also likely mild left AC joint arthritis. Injected 1cc depomedrol and 2 cc lidocaine w/out epi into left subacromial space via posterolateral approach.  Pt tolerated procedure well, no immediate complications. Post-injection care discussed.  He'll continue 650-1000mg  tylenol q6-8 h prn.  FOLLOW UP: prn    2

## 2011-01-15 ENCOUNTER — Telehealth: Payer: Self-pay | Admitting: Family Medicine

## 2011-01-15 NOTE — Telephone Encounter (Signed)
Patient has pain in back and arm was told to call back if not better .Wants to know what he should do cannot come to an appointment tomorrow morning.

## 2011-01-15 NOTE — Telephone Encounter (Signed)
Unfortunately, I have nothing left to offer him for this problem, so I recommend he see an orthopedist.  Let me know if he wants this and I'll order the referral.  Also, I would like to do a plain x-ray of his left shoulder. Please see where he wants to go for the x-ray.  Thx--PM

## 2011-01-15 NOTE — Telephone Encounter (Signed)
Pt states he will put off until he comes back from the beach.  He is leaving on Saturday for a week.  He will call back when he returns.

## 2011-01-23 ENCOUNTER — Encounter: Payer: Medicare Other | Admitting: *Deleted

## 2011-01-28 ENCOUNTER — Other Ambulatory Visit: Payer: Self-pay | Admitting: Family Medicine

## 2011-01-28 ENCOUNTER — Ambulatory Visit (HOSPITAL_BASED_OUTPATIENT_CLINIC_OR_DEPARTMENT_OTHER)
Admission: RE | Admit: 2011-01-28 | Discharge: 2011-01-28 | Disposition: A | Discharge status: Home / Self Care | Payer: Medicare Other | Source: Ambulatory Visit | Attending: Family Medicine | Admitting: Family Medicine

## 2011-01-28 ENCOUNTER — Ambulatory Visit (INDEPENDENT_AMBULATORY_CARE_PROVIDER_SITE_OTHER): Payer: Medicare Other | Admitting: Family Medicine

## 2011-01-28 ENCOUNTER — Encounter: Payer: Self-pay | Admitting: Family Medicine

## 2011-01-28 ENCOUNTER — Other Ambulatory Visit: Payer: Self-pay | Admitting: Cardiovascular Disease

## 2011-01-28 ENCOUNTER — Other Ambulatory Visit: Payer: Medicare Other | Admitting: *Deleted

## 2011-01-28 DIAGNOSIS — D510 Vitamin B12 deficiency anemia due to intrinsic factor deficiency: Secondary | ICD-10-CM

## 2011-01-28 DIAGNOSIS — M719 Bursopathy, unspecified: Secondary | ICD-10-CM

## 2011-01-28 DIAGNOSIS — M7580 Other shoulder lesions, unspecified shoulder: Secondary | ICD-10-CM

## 2011-01-28 DIAGNOSIS — Z23 Encounter for immunization: Secondary | ICD-10-CM

## 2011-01-28 DIAGNOSIS — M25511 Pain in right shoulder: Secondary | ICD-10-CM

## 2011-01-28 DIAGNOSIS — M545 Low back pain: Secondary | ICD-10-CM

## 2011-01-28 DIAGNOSIS — M25512 Pain in left shoulder: Secondary | ICD-10-CM

## 2011-01-28 DIAGNOSIS — M549 Dorsalgia, unspecified: Secondary | ICD-10-CM | POA: Insufficient documentation

## 2011-01-28 DIAGNOSIS — M546 Pain in thoracic spine: Secondary | ICD-10-CM | POA: Insufficient documentation

## 2011-01-28 DIAGNOSIS — M25519 Pain in unspecified shoulder: Secondary | ICD-10-CM

## 2011-01-28 MED ORDER — HYDROCODONE-ACETAMINOPHEN 5-325 MG PO TABS: ORAL_TABLET | ORAL | Status: DC

## 2011-01-28 MED ORDER — CYANOCOBALAMIN 1000 MCG/ML IJ SOLN
1000.0000 ug | Freq: Once | INTRAMUSCULAR | Status: AC
  Administered 2011-01-28: 1000 ug via INTRAMUSCULAR

## 2011-01-28 NOTE — Progress Notes (Signed)
Quick Note:  Pls notify: his x-rays today showed mild arthritis in his back and both shoulders. This is what I expected to see, also likely what he expected. Continue with plan for PT--PM ______

## 2011-01-28 NOTE — Progress Notes (Signed)
OFFICE NOTE  01/28/2011  CC:  Chief Complaint  Patient presents with  . Follow-up    back pain     HPI:   Patient is a 75 y.o. Caucasian male who is here for f/u shoulder pain. Feels like left shoulder is improved some since I injected it a few weeks ago, but still some pain focused at [facility] joint.  No rest pain in left shoulder. Right shoulder now hurts, onset not long after I injected the left shoulder a few weeks ago.  Pain with abduction: hurts at edge of acromion and over deltoid, even at rest.  No injury.  No neck pain.  No radiation of pain down arms, no paresthesias or weakness. Has hx of this in left shoulder a few years ago, was told by ortho that he had torn rotator cuff and was referred for PT and this made all symptoms resolve. He has been taking tylenol and it helps some/briefly.  Pertinent PMH:  No neck or shoulder trauma. +Hx of rotator cuff tendonitis/tear: no surgery for this in the past.  MEDS;   Outpatient Prescriptions Prior to Visit  Medication Sig Dispense Refill  . acetaminophen (TYLENOL) 325 MG tablet Take 325 mg by mouth as needed.        Marland Kitchen albuterol (PROVENTIL) (2.5 MG/3ML) 0.083% nebulizer solution Take 2.5 mg by nebulization every 6 (six) hours as needed.        Marland Kitchen amiodarone (PACERONE) 200 MG tablet Take 200 mg by mouth daily.        Marland Kitchen aspirin 81 MG tablet Take 81 mg by mouth daily.        Marland Kitchen BENICAR 20 MG tablet TAKE ONE-HALF TABLET BY MOUTH DAILY  30 tablet  2  . budesonide (PULMICORT) 0.25 MG/2ML nebulizer solution Take 0.25 mg by nebulization 2 (two) times daily.        . carvedilol (COREG) 6.25 MG tablet Take 6.25 mg by mouth 2 (two) times daily with a meal.        . cetirizine (ZYRTEC) 10 MG tablet Take 10 mg by mouth daily.        . clotrimazole-betamethasone (LOTRISONE) cream Apply topically as needed.       . cyanocobalamin 1000 MCG/ML injection Inject into the muscle every 30 (thirty) days.        Marland Kitchen desoximetasone (TOPICORT) 0.25 % cream Apply  topically as needed.        Marland Kitchen esomeprazole (NEXIUM) 40 MG capsule Take 1 capsule (40 mg total) by mouth daily.  30 capsule  11  . finasteride (PROSCAR) 5 MG tablet Take 5 mg by mouth daily.        . furosemide (LASIX) 40 MG tablet Take 1 tablet (40 mg total) by mouth 2 (two) times daily.  60 tablet  3  . guaiFENesin-dextromethorphan (ROBITUSSIN DM) 100-10 MG/5ML syrup As needed for cough       . ipratropium (ATROVENT) 0.03 % nasal spray Two sprays three times a day as needed before meals  30 mL  1  . isosorbide mononitrate (IMDUR) 60 MG 24 hr tablet Take 60 mg by mouth daily.        Marland Kitchen levalbuterol (XOPENEX HFA) 45 MCG/ACT inhaler Inhale 1-2 puffs into the lungs every 6 (six) hours as needed.  1 Inhaler  6  . levothyroxine (SYNTHROID, LEVOTHROID) 125 MCG tablet Take 125 mcg by mouth every other day.       . levothyroxine (SYNTHROID, LEVOTHROID) 125 MCG tablet Take 62.5 mcg  by mouth every other day.        . lithium 300 MG capsule Take 300 mg by mouth 2 (two) times daily with a meal.        . mometasone (NASONEX) 50 MCG/ACT nasal spray Place 2 sprays into the nose 2 (two) times daily.  17 g  0  . nitroGLYCERIN (NITROSTAT) 0.3 MG SL tablet Place 0.3 mg under the tongue every 5 (five) minutes as needed.        . NON FORMULARY 2 Act daily. Flutter Valve.       . NON FORMULARY as directed. Home nebulizer.       . potassium chloride (KLOR-CON) 10 MEQ CR tablet Take 10 mEq by mouth daily.        . rosuvastatin (CRESTOR) 5 MG tablet Take 5 mg by mouth once a week. Once a week      . sitaGLIPtin (JANUVIA) 100 MG tablet Take 1 tablet (100 mg total) by mouth daily.  30 tablet  3  . Tamsulosin HCl (FLOMAX) 0.4 MG CAPS Take 0.4 mg by mouth daily.        Marland Kitchen tiotropium (SPIRIVA) 18 MCG inhalation capsule Place 1 capsule (18 mcg total) into inhaler and inhale daily.  30 capsule  5  . traZODone (DESYREL) 50 MG tablet 1-2 tabs po qhs  30 tablet  1  . warfarin (COUMADIN) 5 MG tablet TAKE AS DIRECTED PER COUMADIN  CLINIC  30 tablet  3  . HYDROcodone-acetaminophen (NORCO) 5-325 MG per tablet 1-2 tabs po q6h prn pain  30 tablet  1   No facility-administered medications prior to visit.    PE: Blood pressure 166/77, pulse 66, temperature 97.5 F (36.4 C), temperature source Oral, height 5' 10.5" (1.791 m), weight 167 lb (75.751 kg), SpO2 92.00%. Gen: Alert, well appearing.  Patient is oriented to person, place, time, and situation. Neck nontender.  ROM full. Left shoulder with mild TTP over AC joint.  Left subacromial area without tenderness.  ER/IR/Flexion/extension intact without pain.  Abduction is painful, drop sign negative.  No weakness or sensory deficit. Right shoulder: no AC joint tenderness.  Mild TTP around hook of acromion.  +impingement signs.  Negative drop sign.   ER/IR/flexion/extension intact w/out pain.  No weakness or sensory deficit.  IMPRESSION AND PLAN:  Shoulder pain, bilateral Multifactorial: Left--improving rotator cuff tendonitis (s/p subacrom injection of steroid a few weeks ago), mild AC joint pain, suspect some GH jt arthritis. Right: rotator cuff tendonitis, suspect some GH jt arthritis.  Plan is to increase pain med to hydrocodone/acet 5/325, 1-2 q6h prn.  Check plain films of both shoulders.  Refer to PT.     Flu vaccine IM today.  Vit B12 1000 mcg IM given today as per his usual monthly schedule for pernicious anemia.  FOLLOW UP:  Return if symptoms worsen or fail to improve. He has routine f/u set with me in a little over a month.

## 2011-01-28 NOTE — Assessment & Plan Note (Signed)
Multifactorial: Left--improving rotator cuff tendonitis (s/p subacrom injection of steroid a few weeks ago), mild AC joint pain, suspect some GH jt arthritis. Right: rotator cuff tendonitis, suspect some GH jt arthritis.  Plan is to increase pain med to hydrocodone/acet 5/325, 1-2 q6h prn.  Check plain films of both shoulders.  Refer to PT.

## 2011-01-30 ENCOUNTER — Ambulatory Visit (INDEPENDENT_AMBULATORY_CARE_PROVIDER_SITE_OTHER): Payer: Medicare Other | Admitting: *Deleted

## 2011-01-30 ENCOUNTER — Other Ambulatory Visit: Payer: Self-pay | Admitting: *Deleted

## 2011-01-30 ENCOUNTER — Encounter: Payer: Self-pay | Admitting: Internal Medicine

## 2011-01-30 DIAGNOSIS — I4891 Unspecified atrial fibrillation: Secondary | ICD-10-CM

## 2011-01-30 DIAGNOSIS — I428 Other cardiomyopathies: Secondary | ICD-10-CM

## 2011-01-30 LAB — I-STAT 8, (EC8 V) (CONVERTED LAB)
BUN: 11
Bicarbonate: 27.5 — ABNORMAL HIGH
Glucose, Bld: 127 — ABNORMAL HIGH
Hemoglobin: 11.9 — ABNORMAL LOW
Potassium: 4.2
Sodium: 140
pH, Ven: 7.362 — ABNORMAL HIGH

## 2011-01-30 LAB — COMPREHENSIVE METABOLIC PANEL
ALT: 18
AST: 16
CO2: 30
Chloride: 101
Creatinine, Ser: 1.33
GFR calc Af Amer: >60
GFR calc non Af Amer: 53 — ABNORMAL LOW
Total Bilirubin: 0.6

## 2011-01-30 LAB — BASIC METABOLIC PANEL
BUN: 24 — ABNORMAL HIGH
CO2: 30
Chloride: 101
Glucose, Bld: 152 — ABNORMAL HIGH
Potassium: 3.8

## 2011-01-30 LAB — CARDIAC PANEL(CRET KIN+CKTOT+MB+TROPI)
CK, MB: 1.3
CK, MB: 1.3
Relative Index: 1.3
Relative Index: 1.3

## 2011-01-30 LAB — FOLATE: Folate: 18.4

## 2011-01-30 LAB — LIPID PANEL
Cholesterol: 120
LDL Cholesterol: 59
Total CHOL/HDL Ratio: 2.4

## 2011-01-30 LAB — POCT I-STAT CREATININE: Creatinine, Ser: 1.2

## 2011-01-30 LAB — CBC
HCT: 34.3 — ABNORMAL LOW
Hemoglobin: 11.4 — ABNORMAL LOW
Hemoglobin: 11.8 — ABNORMAL LOW
Hemoglobin: 12 — ABNORMAL LOW
MCHC: 33.3
MCV: 93.3
MCV: 93.7
RBC: 3.6 — ABNORMAL LOW
RBC: 3.81 — ABNORMAL LOW
RBC: 3.89 — ABNORMAL LOW
RDW: 13.9
RDW: 14.2
WBC: 6.6
WBC: 8.1

## 2011-01-30 LAB — POCT CARDIAC MARKERS
Myoglobin, poc: 69.9
Operator id: 198171

## 2011-01-30 LAB — APTT
aPTT: 30
aPTT: 97 — ABNORMAL HIGH

## 2011-01-30 LAB — FERRITIN: Ferritin: 83 (ref 22–322)

## 2011-01-30 LAB — HEPARIN LEVEL (UNFRACTIONATED): Heparin Unfractionated: 0.4

## 2011-01-30 LAB — IRON AND TIBC
Saturation Ratios: 4 — ABNORMAL LOW
TIBC: 302
UIBC: 289

## 2011-01-30 LAB — POCT INR: INR: 3.6

## 2011-01-30 LAB — PROTIME-INR: Prothrombin Time: 13.1

## 2011-01-30 MED ORDER — CARVEDILOL 3.125 MG PO TABS: 3.1250 mg | ORAL_TABLET | Freq: Two times a day (BID) | ORAL | Status: DC

## 2011-01-30 NOTE — Progress Notes (Signed)
ICD check 

## 2011-01-31 LAB — PROTIME-INR: INR: 1.3

## 2011-01-31 LAB — BASIC METABOLIC PANEL
BUN: 27 — ABNORMAL HIGH
CO2: 29
CO2: 30
Calcium: 9.5
Chloride: 102
Chloride: 103
Creatinine, Ser: 1.43
Creatinine, Ser: 1.62 — ABNORMAL HIGH
Glucose, Bld: 110 — ABNORMAL HIGH
Sodium: 138

## 2011-01-31 LAB — CBC
HCT: 34 — ABNORMAL LOW
HCT: 36.1 — ABNORMAL LOW
Hemoglobin: 11.4 — ABNORMAL LOW
Hemoglobin: 11.6 — ABNORMAL LOW
Hemoglobin: 12 — ABNORMAL LOW
MCHC: 33.2
MCHC: 34
MCV: 92.1
MCV: 93
Platelets: 123 — ABNORMAL LOW
Platelets: 147 — ABNORMAL LOW
RDW: 13.4
RDW: 13.7
RDW: 14

## 2011-02-04 ENCOUNTER — Encounter: Payer: Medicare Other | Admitting: *Deleted

## 2011-02-06 LAB — CBC
HCT: 36.4 — ABNORMAL LOW
HCT: 38.8 — ABNORMAL LOW
MCHC: 32.6
MCHC: 32.7
MCHC: 32.9
MCV: 92.5
MCV: 92.7
MCV: 92.9
Platelets: 118 — ABNORMAL LOW
Platelets: 126 — ABNORMAL LOW
RBC: 3.92 — ABNORMAL LOW
RBC: 4.07 — ABNORMAL LOW
RDW: 13.4
WBC: 13.4 — ABNORMAL HIGH

## 2011-02-06 LAB — CARDIAC PANEL(CRET KIN+CKTOT+MB+TROPI)
CK, MB: 1.2
CK, MB: 1.5
Total CK: 33
Troponin I: 0.02

## 2011-02-06 LAB — TROPONIN I: Troponin I: 0.03

## 2011-02-06 LAB — DIFFERENTIAL
Basophils Absolute: 0
Basophils Relative: 0
Basophils Relative: 0
Eosinophils Absolute: 0.1
Eosinophils Absolute: 0.1
Eosinophils Relative: 1
Lymphs Abs: 0.9
Monocytes Absolute: 1.2 — ABNORMAL HIGH
Monocytes Relative: 9
Neutrophils Relative %: 83 — ABNORMAL HIGH
Neutrophils Relative %: 83 — ABNORMAL HIGH

## 2011-02-06 LAB — DIGOXIN LEVEL: Digoxin Level: 0.4 — ABNORMAL LOW

## 2011-02-06 LAB — BASIC METABOLIC PANEL
BUN: 16
BUN: 19
CO2: 27
CO2: 32
CO2: 33 — ABNORMAL HIGH
Chloride: 101
Chloride: 103
Chloride: 104
Creatinine, Ser: 1.14
Creatinine, Ser: 1.21
GFR calc Af Amer: >60
Glucose, Bld: 131 — ABNORMAL HIGH
Potassium: 3.8

## 2011-02-06 LAB — POCT I-STAT, CHEM 8
BUN: 20
Chloride: 102
Creatinine, Ser: 1.2
Glucose, Bld: 145 — ABNORMAL HIGH
Potassium: 3.8

## 2011-02-06 LAB — MAGNESIUM
Magnesium: 1.8
Magnesium: 2.1

## 2011-02-06 LAB — CULTURE, RESPIRATORY W GRAM STAIN

## 2011-02-06 LAB — EXPECTORATED SPUTUM ASSESSMENT W GRAM STAIN, RFLX TO RESP C

## 2011-02-06 LAB — PROTIME-INR
INR: 2.4 — ABNORMAL HIGH
Prothrombin Time: 26.8 — ABNORMAL HIGH
Prothrombin Time: 38.6 — ABNORMAL HIGH

## 2011-02-06 LAB — CK TOTAL AND CKMB (NOT AT ARMC): Relative Index: INVALID

## 2011-02-06 LAB — TSH: TSH: 2.056

## 2011-02-06 LAB — POCT CARDIAC MARKERS: Operator id: 277751

## 2011-02-11 ENCOUNTER — Telehealth: Payer: Self-pay | Admitting: Internal Medicine

## 2011-02-11 ENCOUNTER — Encounter: Payer: Self-pay | Admitting: Family Medicine

## 2011-02-11 ENCOUNTER — Ambulatory Visit (INDEPENDENT_AMBULATORY_CARE_PROVIDER_SITE_OTHER): Payer: Medicare Other | Admitting: Family Medicine

## 2011-02-11 VITALS — BP 130/70 | HR 59 | Temp 97.7°F | Wt 162.0 lb

## 2011-02-11 DIAGNOSIS — Z0289 Encounter for other administrative examinations: Secondary | ICD-10-CM

## 2011-02-11 DIAGNOSIS — G5602 Carpal tunnel syndrome, left upper limb: Secondary | ICD-10-CM

## 2011-02-11 DIAGNOSIS — G5600 Carpal tunnel syndrome, unspecified upper limb: Secondary | ICD-10-CM

## 2011-02-11 HISTORY — DX: Carpal tunnel syndrome, left upper limb: G56.02

## 2011-02-11 NOTE — Telephone Encounter (Signed)
Spoke with wife and she is going to see how the splint works and she will call us back if needed

## 2011-02-11 NOTE — Telephone Encounter (Signed)
Pt's has questions about his meds amiodarone

## 2011-02-11 NOTE — Telephone Encounter (Signed)
Went and saw Dr Milinda Cave today and thinks he has possible carpel tunnel and put him in a splint

## 2011-02-11 NOTE — Progress Notes (Signed)
OFFICE NOTE  02/11/2011  CC:  Chief Complaint  Patient presents with  . Tremors     HPI:   Patient is a 75 y.o. Caucasian male who is here for numbness in left hand area. Onset yesterday morning of feeling of decreased sensation in left thumb, index finger, middle finger, and inner part of ring finger.  No distinct weakness but had trouble holding coffee cup b/c sensation decreased.  No paresthesias in arm.  He does have achiness in his left shoulder and upper arm which has been there much of the last few months.  No symptoms in wrist or forearm.  Of note, he is left handed. No dysphagia, no dysarthria, no tremor, no facial droop, no focal weakness of arms, legs, hands, or feet. No neck pain.  He went to PT for his shoulders and back today and the therapist recommended he come here today b/c of his symptoms.  Pertinent PMH:  DM2, well controlled HTN Ischemic cardiomyopathy A-fib, on coumadin Osteoarthritis Bilateral shoulder pain and chronic mid and low back pain COPD BPH CRI Hypothyroidism Thoracic aortic aneurism  MEDS;   Outpatient Prescriptions Prior to Visit  Medication Sig Dispense Refill  . acetaminophen (TYLENOL) 325 MG tablet Take 325 mg by mouth as needed.        Marland Kitchen albuterol (PROVENTIL) (2.5 MG/3ML) 0.083% nebulizer solution Take 2.5 mg by nebulization every 6 (six) hours as needed.        Marland Kitchen amiodarone (PACERONE) 200 MG tablet Take 200 mg by mouth daily.        Marland Kitchen aspirin 81 MG tablet Take 81 mg by mouth daily.        Marland Kitchen BENICAR 20 MG tablet TAKE ONE-HALF TABLET BY MOUTH DAILY  30 tablet  2  . budesonide (PULMICORT) 0.25 MG/2ML nebulizer solution Take 0.25 mg by nebulization 2 (two) times daily.        . carvedilol (COREG) 3.125 MG tablet Take 1 tablet (3.125 mg total) by mouth 2 (two) times daily.  60 tablet  11  . carvedilol (COREG) 6.25 MG tablet Take 3.125 mg by mouth 2 (two) times daily with a meal.       . cetirizine (ZYRTEC) 10 MG tablet Take 10 mg by mouth  daily.        . clotrimazole-betamethasone (LOTRISONE) cream Apply topically as needed.       . cyanocobalamin 1000 MCG/ML injection Inject into the muscle every 30 (thirty) days.        Marland Kitchen desoximetasone (TOPICORT) 0.25 % cream Apply topically as needed.        Marland Kitchen esomeprazole (NEXIUM) 40 MG capsule Take 1 capsule (40 mg total) by mouth daily.  30 capsule  11  . finasteride (PROSCAR) 5 MG tablet Take 5 mg by mouth daily.        . furosemide (LASIX) 40 MG tablet Take 40 mg by mouth daily.        Marland Kitchen guaiFENesin-dextromethorphan (ROBITUSSIN DM) 100-10 MG/5ML syrup As needed for cough       . HYDROcodone-acetaminophen (NORCO) 5-325 MG per tablet 1-2 tabs po q6h prn pain  30 tablet  1  . ipratropium (ATROVENT) 0.03 % nasal spray Two sprays three times a day as needed before meals  30 mL  1  . isosorbide mononitrate (IMDUR) 60 MG 24 hr tablet Take 60 mg by mouth daily.        Marland Kitchen levalbuterol (XOPENEX HFA) 45 MCG/ACT inhaler Inhale 1-2 puffs into the lungs every  6 (six) hours as needed.  1 Inhaler  6  . levothyroxine (SYNTHROID, LEVOTHROID) 125 MCG tablet Take 125 mcg by mouth every other day.       . levothyroxine (SYNTHROID, LEVOTHROID) 125 MCG tablet Take 62.5 mcg by mouth every other day.        . lithium 300 MG capsule Take 300 mg by mouth 2 (two) times daily with a meal.        . mometasone (NASONEX) 50 MCG/ACT nasal spray Place 2 sprays into the nose 2 (two) times daily.  17 g  0  . nitroGLYCERIN (NITROSTAT) 0.3 MG SL tablet Place 0.3 mg under the tongue every 5 (five) minutes as needed.        . NON FORMULARY 2 Act daily. Flutter Valve.       . NON FORMULARY as directed. Home nebulizer.       . potassium chloride (KLOR-CON) 10 MEQ CR tablet Take 10 mEq by mouth daily.        . rosuvastatin (CRESTOR) 5 MG tablet Take 5 mg by mouth once a week. Once a week      . sitaGLIPtin (JANUVIA) 100 MG tablet Take 1 tablet (100 mg total) by mouth daily.  30 tablet  3  . Tamsulosin HCl (FLOMAX) 0.4 MG CAPS  Take 0.4 mg by mouth daily.        Marland Kitchen tiotropium (SPIRIVA) 18 MCG inhalation capsule Place 1 capsule (18 mcg total) into inhaler and inhale daily.  30 capsule  5  . warfarin (COUMADIN) 5 MG tablet TAKE AS DIRECTED PER COUMADIN CLINIC  30 tablet  3    PE: Blood pressure 130/70, pulse 59, temperature 97.7 F (36.5 C), temperature source Oral, weight 162 lb (73.483 kg), SpO2 95.00%. Gen: Alert, well appearing.  Patient is oriented to person, place, time, and situation. Chest: symmetric expansion, nonlabored respirations.  Clear and equal breath sounds in all lung fields.   CV: Irregularly irregular rhythm, rate in 60s,   Neuro: CN 2-12 intact bilaterally, strength 5/5 in proximal and distal upper extremities and lower extremities bilaterally.  No sensory deficits.  No tremor.  No disdiadochokinesis.  No ataxia.  Upper extremity and lower extremity DTRs symmetric.  No pronator drift. Tinel's and phalen's negative bilaterally.  Spurling's negative.  Radial pulses 2+ bilat.     IMPRESSION AND PLAN:  Carpal tunnel syndrome of left wrist Start wrist splint (fitted in office today and he left wearing the splint) as much as possible over the next few days, then focus on wearing during sleep as symptoms improve.  I see no sign of any evolving CNS event, nor do I think this is nerve compression at the cervical root level. He is fine to continue PT for shoulders and back.    FOLLOW UP:  Return if symptoms worsen or fail to improve.

## 2011-02-11 NOTE — Assessment & Plan Note (Signed)
Start wrist splint (fitted in office today and he left wearing the splint) as much as possible over the next few days, then focus on wearing during sleep as symptoms improve.  I see no sign of any evolving CNS event, nor do I think this is nerve compression at the cervical root level. He is fine to continue PT for shoulders and back.

## 2011-02-12 ENCOUNTER — Encounter: Payer: Self-pay | Admitting: Family Medicine

## 2011-02-12 LAB — POCT I-STAT, CHEM 8
BUN: 30 — ABNORMAL HIGH
Calcium, Ion: 1.28
Creatinine, Ser: 1.3
Glucose, Bld: 116 — ABNORMAL HIGH
Hemoglobin: 13.3
Sodium: 143
TCO2: 29

## 2011-02-12 LAB — COMPREHENSIVE METABOLIC PANEL
ALT: 15
AST: 18
Albumin: 3.4 — ABNORMAL LOW
Alkaline Phosphatase: 52
BUN: 20
CO2: 28
Calcium: 9.3
Chloride: 106
Creatinine, Ser: 1.13
GFR calc Af Amer: >60
GFR calc non Af Amer: >60
Glucose, Bld: 126 — ABNORMAL HIGH
Potassium: 3.9
Sodium: 140
Total Bilirubin: 0.6
Total Protein: 5.9 — ABNORMAL LOW

## 2011-02-12 LAB — CBC
HCT: 35.8 — ABNORMAL LOW
HCT: 38.2 — ABNORMAL LOW
Hemoglobin: 11.9 — ABNORMAL LOW
Hemoglobin: 12.4 — ABNORMAL LOW
MCHC: 33.1
MCV: 93.1
Platelets: 143 — ABNORMAL LOW
RDW: 14.5
WBC: 7.2

## 2011-02-12 LAB — LIPID PANEL
Cholesterol: 175
HDL: 42
LDL Cholesterol: 117 — ABNORMAL HIGH
Total CHOL/HDL Ratio: 4.2
Triglycerides: 78
VLDL: 16

## 2011-02-12 LAB — POCT CARDIAC MARKERS: Troponin i, poc: <0.05

## 2011-02-12 LAB — APTT: aPTT: 39 — ABNORMAL HIGH

## 2011-02-12 LAB — DIFFERENTIAL
Eosinophils Absolute: 0.2
Eosinophils Relative: 3
Lymphs Abs: 1
Monocytes Absolute: 0.5

## 2011-02-12 LAB — PROTIME-INR
INR: 2.1 — ABNORMAL HIGH
INR: 2.1 — ABNORMAL HIGH
Prothrombin Time: 24.6 — ABNORMAL HIGH
Prothrombin Time: 24.8 — ABNORMAL HIGH

## 2011-02-12 LAB — GLUCOSE, CAPILLARY: Glucose-Capillary: 134 — ABNORMAL HIGH

## 2011-02-12 LAB — CARDIAC PANEL(CRET KIN+CKTOT+MB+TROPI)
CK, MB: 2.2
Relative Index: 1.9
Total CK: 116
Troponin I: 0.02

## 2011-02-12 LAB — CK TOTAL AND CKMB (NOT AT ARMC)
CK, MB: 2.4
Total CK: 154

## 2011-02-12 LAB — TSH: TSH: 4.333

## 2011-02-12 LAB — TROPONIN I: Troponin I: 0.02

## 2011-02-13 ENCOUNTER — Ambulatory Visit (INDEPENDENT_AMBULATORY_CARE_PROVIDER_SITE_OTHER): Payer: Medicare Other | Admitting: *Deleted

## 2011-02-13 ENCOUNTER — Encounter: Payer: Medicare Other | Admitting: *Deleted

## 2011-02-13 DIAGNOSIS — I4891 Unspecified atrial fibrillation: Secondary | ICD-10-CM

## 2011-02-19 ENCOUNTER — Other Ambulatory Visit: Payer: Self-pay | Admitting: Cardiovascular Disease

## 2011-02-19 ENCOUNTER — Other Ambulatory Visit: Payer: Self-pay | Admitting: *Deleted

## 2011-02-19 ENCOUNTER — Other Ambulatory Visit: Payer: Self-pay | Admitting: Family Medicine

## 2011-02-19 MED ORDER — HYDROCODONE-ACETAMINOPHEN 5-325 MG PO TABS: ORAL_TABLET | ORAL | Status: DC

## 2011-02-19 MED ORDER — TRAZODONE HCL 50 MG PO TABS: ORAL_TABLET | ORAL | Status: DC

## 2011-02-19 NOTE — Telephone Encounter (Signed)
RX signed by Dr. Milinda Cave and RX faxed.

## 2011-02-19 NOTE — Telephone Encounter (Signed)
Voicemail from pharmacy for refill.  Last filled on 01/28/11.

## 2011-02-21 ENCOUNTER — Telehealth: Payer: Self-pay | Admitting: Family Medicine

## 2011-02-21 NOTE — Telephone Encounter (Signed)
Patient needs refills on Trazodone & Hydrocodone, Vickie can be reached at work til 1:30

## 2011-02-21 NOTE — Telephone Encounter (Signed)
It is probably the hydrocodone causing his recent memory issues. See if he can cut back some on use of this med.  If pain is still an issue when he is running out of his next bottle of this med then I'll try switching to a different pain med to see if it is better tolerated, but ANY narcotic pain med could cause a similar problem.

## 2011-02-21 NOTE — Telephone Encounter (Addendum)
Spoke to [name] at [facility].  She states orders for these meds were received on 02/19/11 as prescribed by [place].  Spoke to [name] and advised her that RX's are at pharmacy.  She states that [name] is taking 2 hydrocodone every 6 hours.  She has noticed that he is having more memory issues and wonders if this is related to hydrocodone use or any of his other meds.  She states she will notice that he remembers better in public than he does at home when they are alone.  Please advise if any recommendations or advice.

## 2011-02-22 NOTE — Telephone Encounter (Signed)
Advised Vicky of info below.  She will see if he will take less.  She will remind him from [place] that just because the bottle says he can take 1-2 every 6 hours does not mean he has to take 2 every six hours, just as needed.

## 2011-02-24 ENCOUNTER — Other Ambulatory Visit: Payer: Self-pay | Admitting: Pulmonary Disease

## 2011-03-05 ENCOUNTER — Encounter: Payer: Self-pay | Admitting: Family Medicine

## 2011-03-05 ENCOUNTER — Ambulatory Visit (INDEPENDENT_AMBULATORY_CARE_PROVIDER_SITE_OTHER): Payer: Medicare Other | Admitting: Family Medicine

## 2011-03-05 DIAGNOSIS — M25519 Pain in unspecified shoulder: Secondary | ICD-10-CM

## 2011-03-05 DIAGNOSIS — J441 Chronic obstructive pulmonary disease with (acute) exacerbation: Secondary | ICD-10-CM

## 2011-03-05 DIAGNOSIS — M25512 Pain in left shoulder: Secondary | ICD-10-CM

## 2011-03-05 DIAGNOSIS — G5602 Carpal tunnel syndrome, left upper limb: Secondary | ICD-10-CM

## 2011-03-05 DIAGNOSIS — G5600 Carpal tunnel syndrome, unspecified upper limb: Secondary | ICD-10-CM

## 2011-03-05 MED ORDER — PREDNISONE 20 MG PO TABS: ORAL_TABLET | ORAL | Status: DC

## 2011-03-05 MED ORDER — AZITHROMYCIN 250 MG PO TABS: ORAL_TABLET | ORAL | Status: DC

## 2011-03-05 NOTE — Progress Notes (Addendum)
OFFICE VISIT  03/05/2011   CC:  Chief Complaint  Patient presents with  . Follow-up    on DM, A-Fib, cardiomyopathy  . Sinusitis    X 3-4 weeks, coughing (no phlegm), running nose, dizzy  . Back Pain     HPI:    Patient is a 75 y.o. Caucasian male who presents for acute resp illness and f/u shoulder pain and CTS. Has had greater than 3 wks of nasal congestion, PND, now with more and more coughing that is productive of lots of clear/white sputum and the feeling of SOB and wheezing.  No fever, no ST, no HA.   Home alb nebs or xopenex MDI being used 2-4 times per day lately, helping some.  Has chronic musculoskeletal back and shoulder pains but nothing new.   Says PT at [facility] x weeks has been unhelpful and he thinks they are not doing well for him. He asks if referral to different PT okay.  Takes vicodin and this helps but says it makes him drowsy and he tries not to take it much "unless I have to".   ROS: energy level down.  PO intake fair.  Left hand/wrist without any pain, tingling, numbness, or weakness.  No tremor.  No chest pain.  Past Medical History  Diagnosis Date  . CAD (coronary artery disease)     post CABG with prior percutaneous intervention of teh saphenous vein graft with known saphenous vein graft disease.  Marland Kitchen AAA (abdominal aortic aneurysm) 2010    5.8cm  . Aneurysm of thoracic aorta 1995    Rupture 1995 w/spontaneous resolution  . Popliteal artery aneurysm   . Myocardial infarction   . Ischemic cardiomyopathy     EF 40%  . Paroxysmal atrial fibrillation   . Hypertension   . Peripheral vascular disease   . Dyslipidemia   . Diabetes mellitus 2007    dx'd 2007 w/persisting FBS>125  . GERD (gastroesophageal reflux disease)   . Hiatal hernia   . Diverticular disease   . BPH (benign prostatic hypertrophy)   . COPD (chronic obstructive pulmonary disease)     05/09/08 PFT FEV1 1.84 (55%), FVC 4.42 (95%), TLC 6.46 (92%), DLCO 72%, no BD response  .  Pneumonia 1/09  . Allergy   . Shingles   . OSA (obstructive sleep apnea) 06/14/09    PSG RDI 17, PLMI 96, intolerant of CPAP or BPAP  . Chronic renal insufficiency     Baseline Cr 1.5  . Prostate cancer 07/2008    Low grade; watchful waiting with [name]  . Pulmonary nodule, left 03/02/2010    Left base; resolved on f/u CT  . Allergic rhinitis     Past Surgical History  Procedure Date  . Cardiac defibrillator placement 08/28/2004    [name]  . Colon surgery 1995    partial colectomy  . Coronary artery bypass graft 1996  . Aorta - bilateral femoral artery bypass graft 1999    [name] Early  . Nose surgery   . Insertion of pacing lead     New rate sensing pacing lead with removal of a previous implanted ICD and insertion of a device back in the pocket with defibrillation threshold testing  . Abdominal aortic aneurysm repair 1999    Ingrarenal repair/ graft  . Stents     Placed to the saphenous vein graft  . Radiofrequency ablation 2006    A flutter    Outpatient Prescriptions Prior to Visit  Medication Sig  Dispense Refill  . acetaminophen (TYLENOL) 325 MG tablet Take 325 mg by mouth as needed.        Marland Kitchen albuterol (PROVENTIL) (2.5 MG/3ML) 0.083% nebulizer solution Take 2.5 mg by nebulization every 6 (six) hours as needed.        Marland Kitchen amiodarone (PACERONE) 200 MG tablet Take 200 mg by mouth daily.        Marland Kitchen aspirin 81 MG tablet Take 81 mg by mouth daily.        Marland Kitchen BENICAR 20 MG tablet TAKE ONE-HALF TABLET BY MOUTH DAILY  30 tablet  2  . budesonide (PULMICORT) 0.25 MG/2ML nebulizer solution Take 0.25 mg by nebulization 2 (two) times daily.        . carvedilol (COREG) 3.125 MG tablet Take 1 tablet (3.125 mg total) by mouth 2 (two) times daily.  60 tablet  11  . carvedilol (COREG) 6.25 MG tablet Take 3.125 mg by mouth 2 (two) times daily with a meal.       . cetirizine (ZYRTEC) 10 MG tablet Take 10 mg by mouth daily.        . clotrimazole-betamethasone (LOTRISONE) cream  Apply topically as needed.       . cyanocobalamin 1000 MCG/ML injection Inject into the muscle every 30 (thirty) days.        Marland Kitchen desoximetasone (TOPICORT) 0.25 % cream Apply topically as needed.        Marland Kitchen esomeprazole (NEXIUM) 40 MG capsule Take 1 capsule (40 mg total) by mouth daily.  30 capsule  11  . finasteride (PROSCAR) 5 MG tablet Take 5 mg by mouth daily.        . furosemide (LASIX) 40 MG tablet Take 40 mg by mouth daily.        Marland Kitchen guaiFENesin-dextromethorphan (ROBITUSSIN DM) 100-10 MG/5ML syrup As needed for cough       . HYDROcodone-acetaminophen (NORCO) 5-325 MG per tablet 1-2 tabs po q6h prn pain  30 tablet  1  . ipratropium (ATROVENT) 0.03 % nasal spray Two sprays three times a day as needed before meals  30 mL  1  . isosorbide mononitrate (IMDUR) 60 MG 24 hr tablet Take 60 mg by mouth daily.        Marland Kitchen KLOR-CON 10 10 MEQ CR tablet TAKE 1 TABLET BY MOUTH EVERY DAY  30 tablet  6  . levothyroxine (SYNTHROID, LEVOTHROID) 125 MCG tablet Take 125 mcg by mouth every other day.       . levothyroxine (SYNTHROID, LEVOTHROID) 125 MCG tablet Take 62.5 mcg by mouth every other day.        . lithium 300 MG capsule Take 300 mg by mouth 2 (two) times daily with a meal.        . mometasone (NASONEX) 50 MCG/ACT nasal spray Place 2 sprays into the nose 2 (two) times daily.  17 g  0  . nitroGLYCERIN (NITROSTAT) 0.3 MG SL tablet Place 0.3 mg under the tongue every 5 (five) minutes as needed.        . NON FORMULARY 2 Act daily. Flutter Valve.       . NON FORMULARY as directed. Home nebulizer.       . rosuvastatin (CRESTOR) 5 MG tablet Take 5 mg by mouth once a week. Once a week      . sitaGLIPtin (JANUVIA) 100 MG tablet Take 1 tablet (100 mg total) by mouth daily.  30 tablet  3  . Tamsulosin HCl (FLOMAX) 0.4 MG CAPS Take 0.4  mg by mouth daily.        . traZODone (DESYREL) 50 MG tablet 1-2 tabs po qhs prn insomnia  60 tablet  5  . warfarin (COUMADIN) 5 MG tablet TAKE AS DIRECTED PER COUMADIN CLINIC  30 tablet   3    Allergies  Allergen Reactions  . Atorvastatin     REACTION: muscle ache in legs  . Crestor (Rosuvastatin Calcium) Other (See Comments)    Lower ext fatigue and soreness  . Lisinopril Diarrhea  . Morphine     REACTION: AMS - agitation  . Prednisone     REACTION: Can't breath  . Zolpidem Tartrate     REACTION: AMS    ROS As per HPI  PE: Blood pressure 124/73, pulse 65, temperature 97.6 F (36.4 C), temperature source Oral, height 5' 10.5" (1.791 m), weight 162 lb 6.4 oz (73.664 kg), SpO2 94.00%. Gen: Alert, well appearing.  Patient is oriented to person, place, time, and situation. VS: noted--normal. Gen: alert, NAD, NONTOXIC APPEARING. HEENT: eyes without injection, drainage, or swelling.  Ears: EACs clear, TMs with normal light reflex and landmarks.  Nose: Clear rhinorrhea, with some dried, crusty exudate adherent to mildly injected mucosa.  No purulent d/c.  No paranasal sinus TTP.  No facial swelling.  Throat and mouth without focal lesion.  No pharyngial swelling, erythema, or exudate.   Neck: supple, no LAD.   LUNGS: Diffusely diminished BS, mild exp wheezing, and mild prolongation of exp phase in posterior lung fields.  Nonlabored resps.  No signs of consolidation or edema.   CV: RRR.  No rub or gallop. EXT: no c/c/e SKIN: no rash    LABS:  Last INR was 2.9 on 02/13/11  IMPRESSION AND PLAN:  COPD exacerbation Start azithromycin x 5d. Start prednisone 20mg  bid x 5d, then 20mg  qd x 5d (pt has hx of irritability/anxiety on steroids so I went with lowest dosing possible for current situation). Continue home bronchodilators q4-6h prn. F/u in office in 3d.  ADDENDUM: pharmacy called and warned me of potential interaction between pt's amiodarone and the azithromycin, so I changed abx to doxycycline 100mg  bid x 10d (no interaction between doxy and amiodarone listed in epocrates).  Carpal tunnel syndrome of left wrist Much improved. Continue wearing wrist splint  during hours of sleep.  Shoulder pain, bilateral With diffuse musculoskeletal back pain as well. PT at [facility]/rehab not helpful per pt.  He asks for referral to [facility] PT after he clears his current COPD flare, so we'll go ahead and do this soon. Continue prn vicodin for now.  Therapeutic expectations and side effect profile of medication discussed today.  Patient's questions answered.      FOLLOW UP: Return in about 3 days (around 03/08/2011) for f/u copd exac.

## 2011-03-05 NOTE — Assessment & Plan Note (Addendum)
Start azithromycin x 5d. Start prednisone 20mg  bid x 5d, then 20mg  qd x 5d (pt has hx of irritability/anxiety on steroids so I went with lowest dosing possible for current situation). Continue home bronchodilators q4-6h prn. F/u in office in 3d.  ADDENDUM: pharmacy called and warned me of potential interaction between pt's amiodarone and the azithromycin, so I changed abx to doxycycline 100mg  bid x 10d (no interaction between doxy and amiodarone listed in epocrates).

## 2011-03-05 NOTE — Assessment & Plan Note (Signed)
Much improved. Continue wearing wrist splint during hours of sleep.

## 2011-03-05 NOTE — Assessment & Plan Note (Signed)
With diffuse musculoskeletal back pain as well. PT at [facility] not helpful per pt.  He asks for referral to [facility] PT after he clears his current COPD flare, so we'll go ahead and do this soon. Continue prn vicodin for now.  Therapeutic expectations and side effect profile of medication discussed today.  Patient's questions answered.

## 2011-03-06 ENCOUNTER — Ambulatory Visit (INDEPENDENT_AMBULATORY_CARE_PROVIDER_SITE_OTHER): Payer: Medicare Other | Admitting: *Deleted

## 2011-03-06 DIAGNOSIS — I4891 Unspecified atrial fibrillation: Secondary | ICD-10-CM

## 2011-03-06 DIAGNOSIS — Z7901 Long term (current) use of anticoagulants: Secondary | ICD-10-CM

## 2011-03-07 ENCOUNTER — Other Ambulatory Visit: Payer: Self-pay | Admitting: *Deleted

## 2011-03-07 ENCOUNTER — Other Ambulatory Visit: Payer: Self-pay | Admitting: Family Medicine

## 2011-03-07 MED ORDER — TIOTROPIUM BROMIDE MONOHYDRATE 18 MCG IN CAPS: 18.0000 ug | ORAL_CAPSULE | Freq: Every day | RESPIRATORY_TRACT | Status: DC

## 2011-03-07 MED ORDER — LEVOTHYROXINE SODIUM 125 MCG PO TABS: ORAL_TABLET | ORAL | Status: DC

## 2011-03-08 ENCOUNTER — Ambulatory Visit (INDEPENDENT_AMBULATORY_CARE_PROVIDER_SITE_OTHER): Payer: Medicare Other | Admitting: Family Medicine

## 2011-03-08 ENCOUNTER — Encounter: Payer: Self-pay | Admitting: Family Medicine

## 2011-03-08 DIAGNOSIS — J441 Chronic obstructive pulmonary disease with (acute) exacerbation: Secondary | ICD-10-CM

## 2011-03-08 DIAGNOSIS — F329 Major depressive disorder, single episode, unspecified: Secondary | ICD-10-CM

## 2011-03-08 MED ORDER — MILNACIPRAN HCL 12.5 & 25 & 50 MG PO MISC: ORAL | Status: DC

## 2011-03-08 NOTE — Progress Notes (Signed)
OFFICE VISIT  03/10/2011   CC:  Chief Complaint  Patient presents with  . COPD    follow up  . Back Pain    fell off bucket yesterday (sitting)     HPI:    Patient is a 75 y.o. Caucasian male who presents for 3d f/u for flare of COPD. Started prednisone and doxy 3 days ago.  Tolerating these fine, feeling quite a bit better (est 25% improved, maybe more). Requiring rescue bronchodilator less.  PO intake is better. He reiterates that his lack of interest in food has been an issue for 61mo or more.  During this time he has been bothered much more by musculoskeletal back and shoulder pains.  He is frustrated b/c he's been unable to do many things anymore like play golf or walk long distances. He admits to feeling mildly sad/down a lot of the time, but has no crying spells and no suicidal or homicidal thoughts.  He is still active in his church but is otherwise socializing less than normal b/c of his pain.    Also reiterates a bit of inconsistent tendency to have mild lightheadedness and imbalance when walking, has always been explained by his cardiologist as side effect of amiodarone.  NO focal weakness, no HA's, no vertigo, no n/v, no CP, no LE edema, no abd pain, no syncope or presyncope.  Past Medical History  Diagnosis Date  . CAD (coronary artery disease)     post CABG with prior percutaneous intervention of teh saphenous vein graft with known saphenous vein graft disease.  Marland Kitchen AAA (abdominal aortic aneurysm) 2010    5.8cm  . Aneurysm of thoracic aorta 1995    Rupture 1995 w/spontaneous resolution  . Popliteal artery aneurysm   . Myocardial infarction   . Ischemic cardiomyopathy     EF 40%  . Paroxysmal atrial fibrillation   . Hypertension   . Peripheral vascular disease   . Dyslipidemia   . Diabetes mellitus 2007    dx'd 2007 w/persisting FBS>125  . GERD (gastroesophageal reflux disease)   . Hiatal hernia   . Diverticular disease   . BPH (benign prostatic hypertrophy)     . COPD (chronic obstructive pulmonary disease)     05/09/08 PFT FEV1 1.84 (55%), FVC 4.42 (95%), TLC 6.46 (92%), DLCO 72%, no BD response  . Pneumonia 1/09  . Allergy   . Shingles   . OSA (obstructive sleep apnea) 06/14/09    PSG RDI 17, PLMI 96, intolerant of CPAP or BPAP  . Chronic renal insufficiency     Baseline Cr 1.5  . Prostate cancer 07/2008    Low grade; watchful waiting with [name]  . Pulmonary nodule, left 03/02/2010    Left base; resolved on f/u CT  . Allergic rhinitis     Past Surgical History  Procedure Date  . Cardiac defibrillator placement 08/28/2004    [name]  . Colon surgery 1995    partial colectomy  . Coronary artery bypass graft 1996  . Aorta - bilateral femoral artery bypass graft 1999    [name] Early  . Nose surgery   . Insertion of pacing lead     New rate sensing pacing lead with removal of a previous implanted ICD and insertion of a device back in the pocket with defibrillation threshold testing  . Abdominal aortic aneurysm repair 1999    Ingrarenal repair/ graft  . Stents     Placed to the saphenous vein graft  .  Radiofrequency ablation 2006    A flutter    Outpatient Prescriptions Prior to Visit  Medication Sig Dispense Refill  . acetaminophen (TYLENOL) 325 MG tablet Take 325 mg by mouth as needed.        Marland Kitchen albuterol (PROVENTIL) (2.5 MG/3ML) 0.083% nebulizer solution Take 2.5 mg by nebulization every 6 (six) hours as needed.        Marland Kitchen amiodarone (PACERONE) 200 MG tablet Take 200 mg by mouth daily.        Marland Kitchen aspirin 81 MG tablet Take 81 mg by mouth daily.        Marland Kitchen BENICAR 20 MG tablet TAKE ONE-HALF TABLET BY MOUTH DAILY  30 tablet  2  . budesonide (PULMICORT) 0.25 MG/2ML nebulizer solution Take 0.25 mg by nebulization 2 (two) times daily.        . carvedilol (COREG) 3.125 MG tablet Take 1 tablet (3.125 mg total) by mouth 2 (two) times daily.  60 tablet  11  . carvedilol (COREG) 6.25 MG tablet Take 3.125 mg by mouth 2 (two) times  daily with a meal.       . cetirizine (ZYRTEC) 10 MG tablet Take 10 mg by mouth daily.        . Chlorpheniramine-Acetaminophen (CORICIDIN HBP COLD/FLU PO) Take 2 tablets by mouth every 6 (six) hours.        . clotrimazole-betamethasone (LOTRISONE) cream Apply topically as needed.       . cyanocobalamin 1000 MCG/ML injection Inject into the muscle every 30 (thirty) days.        Marland Kitchen desoximetasone (TOPICORT) 0.25 % cream Apply topically as needed.        Marland Kitchen esomeprazole (NEXIUM) 40 MG capsule Take 1 capsule (40 mg total) by mouth daily.  30 capsule  11  . finasteride (PROSCAR) 5 MG tablet Take 5 mg by mouth daily.        . furosemide (LASIX) 40 MG tablet Take 40 mg by mouth daily.        Marland Kitchen guaiFENesin (MUCINEX) 600 MG 12 hr tablet Take 1,200 mg by mouth 2 (two) times daily.        Marland Kitchen guaiFENesin-dextromethorphan (ROBITUSSIN DM) 100-10 MG/5ML syrup As needed for cough       . HYDROcodone-acetaminophen (NORCO) 5-325 MG per tablet 1-2 tabs po q6h prn pain  30 tablet  1  . ipratropium (ATROVENT) 0.03 % nasal spray Two sprays three times a day as needed before meals  30 mL  1  . isosorbide mononitrate (IMDUR) 60 MG 24 hr tablet Take 60 mg by mouth daily.        Marland Kitchen KLOR-CON 10 10 MEQ CR tablet TAKE 1 TABLET BY MOUTH EVERY DAY  30 tablet  6  . levothyroxine (SYNTHROID, LEVOTHROID) 125 MCG tablet Alternate 1 tab qd with 1/2 tab qd  23 tablet  6  . lithium 300 MG capsule Take 300 mg by mouth 2 (two) times daily with a meal.        . mometasone (NASONEX) 50 MCG/ACT nasal spray Place 2 sprays into the nose 2 (two) times daily.  17 g  0  . nitroGLYCERIN (NITROSTAT) 0.3 MG SL tablet Place 0.3 mg under the tongue every 5 (five) minutes as needed.        . NON FORMULARY 2 Act daily. Flutter Valve.       . NON FORMULARY as directed. Home nebulizer.       . predniSONE (DELTASONE) 20 MG tablet 1 tab po  bid x 5d, then 1 tab po qd x 5d  15 tablet  0  . rosuvastatin (CRESTOR) 5 MG tablet Take 5 mg by mouth once a week.  Once a week      . sitaGLIPtin (JANUVIA) 100 MG tablet Take 1 tablet (100 mg total) by mouth daily.  30 tablet  3  . Tamsulosin HCl (FLOMAX) 0.4 MG CAPS Take 0.4 mg by mouth daily.        Marland Kitchen tiotropium (SPIRIVA HANDIHALER) 18 MCG inhalation capsule Place 1 capsule (18 mcg total) into inhaler and inhale daily.  30 capsule  3  . traZODone (DESYREL) 50 MG tablet 1-2 tabs po qhs prn insomnia  60 tablet  5  . warfarin (COUMADIN) 5 MG tablet TAKE AS DIRECTED PER COUMADIN CLINIC  30 tablet  3  . azithromycin (ZITHROMAX) 250 MG tablet 2 tabs po qd x 1d, then 1 tab po qd x 4d  6 each  0    Allergies  Allergen Reactions  . Atorvastatin     REACTION: muscle ache in legs  . Crestor (Rosuvastatin Calcium) Other (See Comments)    Lower ext fatigue and soreness  . Lisinopril Diarrhea  . Morphine     REACTION: AMS - agitation  . Prednisone     REACTION: Can't breath  . Zolpidem Tartrate     REACTION: AMS    ROS As per HPI  PE: Blood pressure 119/66, pulse 60, temperature 97.5 F (36.4 C), temperature source Oral, weight 162 lb (73.483 kg), SpO2 95.00%. Gen: Alert, well appearing.  Patient is oriented to person, place, time, and situation. HEENT: Ears: EACs clear, normal epithelium.  TMs with good light reflex and landmarks bilaterally.  Eyes: no injection, icteris, swelling, or exudate.  EOMI, PERRLA. Nose: no drainage or turbinate edema/swelling.  No injection or focal lesion.  Mouth: lips without lesion/swelling.  Oral mucosa pink and moist.  Dentition intact and without obvious caries or gingival swelling.  Oropharynx without erythema, exudate, or swelling.  Neck: supple, ROM full.  No lymphadenopathy, thyromegaly, or mass. Chest: symmetric expansion, nonlabored respirations.  Clear and equal breath sounds in all lung fields.   CV: RRR, no m/r/g.  Peripheral pulses 2+ and symmetric. EXT: no clubbing, cyanosis, or edema.    LABS:  None today  IMPRESSION AND PLAN:  COPD  exacerbation Improving appropriately on steroids and doxycycline for the last 3d. Continue these meds, use albut only prn.  Depression His actual mood sx's are not profound, but I think a lot of his musculoskeletal pain is bothering him more/intensified as a side effect of his depression. We discussed treatment options and decided on a trial of savella to see how it helps him with depressed mood and chronic pain. Therapeutic expectations and side effect profile of medication discussed today.  Patient's questions answered. I gave him a 1 mo starter pack and told him to start it when he finishes his prednisone and doxycycline in about 1 wk. I'll see him back in a few weeks at which time he'll hopfully be at 25mg  bid dosing. Call or return for problems. Continue all other chronic meds at current doses.     FOLLOW UP: Return in about 3 weeks (around 03/29/2011) for f/u COPD and chronic pain/depression.

## 2011-03-10 DIAGNOSIS — F329 Major depressive disorder, single episode, unspecified: Secondary | ICD-10-CM | POA: Insufficient documentation

## 2011-03-10 DIAGNOSIS — F32A Depression, unspecified: Secondary | ICD-10-CM | POA: Insufficient documentation

## 2011-03-10 NOTE — Assessment & Plan Note (Signed)
His actual mood sx's are not profound, but I think a lot of his musculoskeletal pain is bothering him more/intensified as a side effect of his depression. We discussed treatment options and decided on a trial of savella to see how it helps him with depressed mood and chronic pain. Therapeutic expectations and side effect profile of medication discussed today.  Patient's questions answered. I gave him a 1 mo starter pack and told him to start it when he finishes his prednisone and doxycycline in about 1 wk. I'll see him back in a few weeks at which time he'll hopfully be at 25mg  bid dosing. Call or return for problems. Continue all other chronic meds at current doses.

## 2011-03-10 NOTE — Assessment & Plan Note (Signed)
Improving appropriately on steroids and doxycycline for the last 3d. Continue these meds, use albut only prn.

## 2011-03-15 ENCOUNTER — Ambulatory Visit (INDEPENDENT_AMBULATORY_CARE_PROVIDER_SITE_OTHER): Payer: Medicare Other | Admitting: *Deleted

## 2011-03-15 DIAGNOSIS — I4891 Unspecified atrial fibrillation: Secondary | ICD-10-CM

## 2011-03-15 DIAGNOSIS — Z7901 Long term (current) use of anticoagulants: Secondary | ICD-10-CM

## 2011-03-15 LAB — POCT INR: INR: 2.4

## 2011-03-23 ENCOUNTER — Encounter (HOSPITAL_COMMUNITY): Payer: Self-pay | Admitting: *Deleted

## 2011-03-23 ENCOUNTER — Emergency Department (HOSPITAL_COMMUNITY)
Admission: EM | Admit: 2011-03-23 | Discharge: 2011-03-23 | Disposition: A | Discharge status: Home / Self Care | Payer: Medicare Other | Attending: Emergency Medicine | Admitting: Emergency Medicine

## 2011-03-23 DIAGNOSIS — I10 Essential (primary) hypertension: Secondary | ICD-10-CM | POA: Insufficient documentation

## 2011-03-23 DIAGNOSIS — N138 Other obstructive and reflux uropathy: Secondary | ICD-10-CM | POA: Insufficient documentation

## 2011-03-23 DIAGNOSIS — I739 Peripheral vascular disease, unspecified: Secondary | ICD-10-CM | POA: Insufficient documentation

## 2011-03-23 DIAGNOSIS — Z7901 Long term (current) use of anticoagulants: Secondary | ICD-10-CM | POA: Insufficient documentation

## 2011-03-23 DIAGNOSIS — I252 Old myocardial infarction: Secondary | ICD-10-CM | POA: Insufficient documentation

## 2011-03-23 DIAGNOSIS — E119 Type 2 diabetes mellitus without complications: Secondary | ICD-10-CM | POA: Insufficient documentation

## 2011-03-23 DIAGNOSIS — R339 Retention of urine, unspecified: Secondary | ICD-10-CM | POA: Insufficient documentation

## 2011-03-23 DIAGNOSIS — I25810 Atherosclerosis of coronary artery bypass graft(s) without angina pectoris: Secondary | ICD-10-CM | POA: Insufficient documentation

## 2011-03-23 DIAGNOSIS — N401 Enlarged prostate with lower urinary tract symptoms: Secondary | ICD-10-CM | POA: Insufficient documentation

## 2011-03-23 LAB — URINALYSIS, ROUTINE W REFLEX MICROSCOPIC
Bilirubin Urine: NEGATIVE
Glucose, UA: NEGATIVE mg/dL
Ketones, ur: NEGATIVE mg/dL
pH: 7.5 (ref 5.0–8.0)

## 2011-03-23 LAB — POCT I-STAT, CHEM 8
BUN: 13 mg/dL (ref 6–23)
Calcium, Ion: 1.2 mmol/L (ref 1.12–1.32)
TCO2: 25 mmol/L (ref 0–100)

## 2011-03-23 NOTE — ED Provider Notes (Signed)
Medical screening examination/treatment/procedure(s) were performed by non-physician practitioner and as supervising physician I was immediately available for consultation/collaboration.  Patient seen and examined. Patient abdomen soft no acute abdominal process appreciated. Labs reviewed patient stable for discharge  Toy Baker, MD 03/23/11 [phone]

## 2011-03-23 NOTE — ED Notes (Signed)
Pt a/o x 4, resting quietly, skin warm and dry, respirations even and unlabored, no acute distress noted, no needs identified at this time, awaiting disposition. Family at bedside

## 2011-03-23 NOTE — ED Notes (Addendum)
Patient is resting comfortably. Family remains at bedside, awaiting disposition, no acute distress noted

## 2011-03-23 NOTE — ED Notes (Signed)
Pt up since 2 am urgency but unable to void, symptoms have been having symptoms starting 2 days ago

## 2011-03-23 NOTE — ED Provider Notes (Signed)
History     CSN: [id number] Arrival date & time: 03/23/2011 10:44 AM   First MD Initiated Contact with Patient 03/23/11 1118      Chief Complaint  Patient presents with  . Urinary Retention    (Consider location/radiation/quality/duration/timing/severity/associated sxs/prior treatment) HPI Comments: Mr. [name] has a   known  enlarged prostate, is followed by urology, has had difficulty in the past with urination, and has self catheter supplies at home: so during and I was able to self catheter for large amounts of urine. Has been able to spontaneously void but feels that he has to strain to do,  has been told by urology that he is a poor surgical candidate due to his multiple comorbid conditions and use of Coumadin.  The history is provided by the spouse.    Past Medical History  Diagnosis Date  . CAD (coronary artery disease)     post CABG with prior percutaneous intervention of teh saphenous vein graft with known saphenous vein graft disease.  Marland Kitchen AAA (abdominal aortic aneurysm) 2010    5.8cm  . Aneurysm of thoracic aorta 1995    Rupture 1995 w/spontaneous resolution  . Popliteal artery aneurysm   . Myocardial infarction   . Ischemic cardiomyopathy     EF 40%  . Paroxysmal atrial fibrillation   . Hypertension   . Peripheral vascular disease   . Dyslipidemia   . Diabetes mellitus 2007    dx'd 2007 w/persisting FBS>125  . GERD (gastroesophageal reflux disease)   . Hiatal hernia   . Diverticular disease   . BPH (benign prostatic hypertrophy)   . COPD (chronic obstructive pulmonary disease)     05/09/08 PFT FEV1 1.84 (55%), FVC 4.42 (95%), TLC 6.46 (92%), DLCO 72%, no BD response  . Pneumonia 1/09  . Allergy   . Shingles   . OSA (obstructive sleep apnea) 06/14/09    PSG RDI 17, PLMI 96, intolerant of CPAP or BPAP  . Chronic renal insufficiency     Baseline Cr 1.5  . Prostate cancer 07/2008    Low grade; watchful waiting with [name]  . Pulmonary nodule, left  03/02/2010    Left base; resolved on f/u CT  . Allergic rhinitis     Past Surgical History  Procedure Date  . Cardiac defibrillator placement 08/28/2004    [name]  . Colon surgery 1995    partial colectomy  . Coronary artery bypass graft 1996  . Aorta - bilateral femoral artery bypass graft 1999    [name] Early  . Nose surgery   . Insertion of pacing lead     New rate sensing pacing lead with removal of a previous implanted ICD and insertion of a device back in the pocket with defibrillation threshold testing  . Abdominal aortic aneurysm repair 1999    Ingrarenal repair/ graft  . Stents     Placed to the saphenous vein graft  . Radiofrequency ablation 2006    A flutter    Family History  Problem Relation Age of Onset  . Stroke Mother 69  . Heart attack Mother     CVA  . Cancer Father 26    Lung  . Heart attack Father 57  . Lung cancer Father   . Coronary artery disease Other     2 of 5 siblings with CAD    History  Substance Use Topics  . Smoking status: Former Smoker -- 1.0 packs/day for 30 years    Types: Cigarettes  Quit date: 05/23/2003  . Smokeless tobacco: Former Neurosurgeon    Types: Chew    Quit date: 05/14/2003   Comment: 80 pack a year history  . Alcohol Use: 0.6 oz/week    1 Cans of beer per week     only occ      Review of Systems.10 Systems reviewed and are negative for acute change except as noted in the HPI. Voided just prior to examination abdomen soft, no stricture at meatus Allergies  Atorvastatin; Crestor; Lisinopril; Morphine; Prednisone; and Zolpidem tartrate  Home Medications   Current Outpatient Rx  Name Route Sig Dispense Refill  . ACETAMINOPHEN 325 MG PO TABS Oral Take 325 mg by mouth every 6 (six) hours as needed. For pain    . ALBUTEROL SULFATE (2.5 MG/3ML) 0.083% IN NEBU Nebulization Take 2.5 mg by nebulization every 6 (six) hours as needed. For shortness of breath    . AMIODARONE HCL 200 MG PO TABS Oral Take 200 mg  by mouth daily.      . ASPIRIN 81 MG PO TABS Oral Take 81 mg by mouth daily.      Marland Kitchen BENICAR 20 MG PO TABS  TAKE ONE-HALF TABLET BY MOUTH DAILY 30 tablet 2  . BUDESONIDE 0.25 MG/2ML IN SUSP Nebulization Take 0.25 mg by nebulization 2 (two) times daily.      Marland Kitchen CARVEDILOL 3.125 MG PO TABS Oral Take 1 tablet (3.125 mg total) by mouth 2 (two) times daily. 60 tablet 11  . CETIRIZINE HCL 10 MG PO TABS Oral Take 10 mg by mouth daily.      Marland Kitchen CLOTRIMAZOLE-BETAMETHASONE 1-0.05 % EX CREA Topical Apply 1 application topically 2 (two) times daily as needed. For skin irritation    . CYANOCOBALAMIN 1000 MCG/ML IJ SOLN Intramuscular Inject into the muscle every 30 (thirty) days.      . DESOXIMETASONE 0.25 % EX CREA Topical Apply 1 application topically 2 (two) times daily as needed. For rash    . ESOMEPRAZOLE MAGNESIUM 40 MG PO CPDR Oral Take 1 capsule (40 mg total) by mouth daily. 30 capsule 11  . FINASTERIDE 5 MG PO TABS Oral Take 5 mg by mouth daily.      . FUROSEMIDE 40 MG PO TABS Oral Take 40 mg by mouth daily.      . GUAIFENESIN 600 MG PO TB12 Oral Take 1,200 mg by mouth 2 (two) times daily as needed. For congestion    . GUAIFENESIN-DM 100-10 MG/5ML PO SYRP Oral Take 5 mLs by mouth 3 (three) times daily as needed. As needed for cough    . HYDROCODONE-ACETAMINOPHEN 5-325 MG PO TABS Oral Take 1-2 tablets by mouth every 6 (six) hours as needed. For pain     . IPRATROPIUM BROMIDE 0.03 % NA SOLN  Two sprays three times a day as needed before meals 30 mL 1  . ISOSORBIDE MONONITRATE 60 MG PO TB24 Oral Take 60 mg by mouth daily.      Marland Kitchen KLOR-CON 10 10 MEQ PO TBCR  TAKE 1 TABLET BY MOUTH EVERY DAY 30 tablet 6  . LEVALBUTEROL TARTRATE 45 MCG/ACT IN AERO Inhalation Inhale 1-2 puffs into the lungs every 4 (four) hours as needed. For shortness of breath     . LEVOTHYROXINE SODIUM 125 MCG PO TABS Oral Take 1 mcg by mouth as directed. Alternate 1 tab qd with 1/2 tab qd     . LITHIUM CARBONATE 300 MG PO CAPS Oral Take 300  mg by mouth 2 (two)  times daily with a meal.      . MENTHOL (TOPICAL ANALGESIC) 5 % EX PADS Transdermal Place 1 patch onto the skin daily as needed. For pain     . MILNACIPRAN HCL 12.5 & 25 & 50 MG PO MISC  As directed 1 each 0  . MOMETASONE FUROATE 50 MCG/ACT NA SUSP Nasal Place 2 sprays into the nose 2 (two) times daily as needed. allergies     . NITROGLYCERIN 0.3 MG SL SUBL Sublingual Place 0.3 mg under the tongue every 5 (five) minutes as needed. For chest pain    . ROSUVASTATIN CALCIUM 5 MG PO TABS Oral Take 5 mg by mouth once a week. Sundays    . SITAGLIPTIN PHOSPHATE 100 MG PO TABS Oral Take 1 tablet (100 mg total) by mouth daily. 30 tablet 3  . TAMSULOSIN HCL 0.4 MG PO CAPS Oral Take 0.4 mg by mouth daily.      Marland Kitchen TIOTROPIUM BROMIDE MONOHYDRATE 18 MCG IN CAPS Inhalation Place 18 mcg into inhaler and inhale daily.      . WARFARIN SODIUM 5 MG PO TABS       . NON FORMULARY  2 Act daily. Flutter Valve.     . NON FORMULARY  as directed. Home nebulizer.       BP 96/60  Pulse 72  Temp 97.5 F (36.4 C)  Resp 20  SpO2 97%  Physical Exam  Constitutional: He is oriented to person, place, and time. He appears well-developed and well-nourished.  HENT:  Head: Normocephalic.  Eyes: EOM are normal.  Neck: Neck supple.  Cardiovascular: Regular rhythm.   Pulmonary/Chest: Breath sounds normal.  Abdominal: Soft. He exhibits no distension. There is no tenderness. There is no rebound.  Genitourinary: Penis normal. No penile tenderness.  Musculoskeletal: Normal range of motion.  Neurological: He is oriented to person, place, and time.  Skin: Skin is warm and dry.    ED Course  Procedures (including critical care time)  Labs Reviewed  URINALYSIS, ROUTINE W REFLEX MICROSCOPIC - Abnormal; Notable for the following:    Hgb urine dipstick LARGE (*)    All other components within normal limits  URINE MICROSCOPIC-ADD ON  I-STAT, CHEM 8   No results found.   No diagnosis found.    MDM    Urinary retention,  Urinary tract infection BPH         Arman Filter, NP 03/23/11 1418

## 2011-03-29 ENCOUNTER — Encounter: Payer: Self-pay | Admitting: Family Medicine

## 2011-03-29 ENCOUNTER — Ambulatory Visit (INDEPENDENT_AMBULATORY_CARE_PROVIDER_SITE_OTHER): Payer: Medicare Other | Admitting: Family Medicine

## 2011-03-29 DIAGNOSIS — E119 Type 2 diabetes mellitus without complications: Secondary | ICD-10-CM

## 2011-03-29 DIAGNOSIS — K5900 Constipation, unspecified: Secondary | ICD-10-CM

## 2011-03-29 DIAGNOSIS — I25810 Atherosclerosis of coronary artery bypass graft(s) without angina pectoris: Secondary | ICD-10-CM

## 2011-03-29 DIAGNOSIS — I2510 Atherosclerotic heart disease of native coronary artery without angina pectoris: Secondary | ICD-10-CM

## 2011-03-29 DIAGNOSIS — R42 Dizziness and giddiness: Secondary | ICD-10-CM | POA: Insufficient documentation

## 2011-03-29 DIAGNOSIS — F329 Major depressive disorder, single episode, unspecified: Secondary | ICD-10-CM

## 2011-03-29 DIAGNOSIS — N189 Chronic kidney disease, unspecified: Secondary | ICD-10-CM

## 2011-03-29 DIAGNOSIS — I2589 Other forms of chronic ischemic heart disease: Secondary | ICD-10-CM

## 2011-03-29 DIAGNOSIS — R109 Unspecified abdominal pain: Secondary | ICD-10-CM

## 2011-03-29 DIAGNOSIS — R339 Retention of urine, unspecified: Secondary | ICD-10-CM

## 2011-03-29 LAB — COMPREHENSIVE METABOLIC PANEL
BUN: 20 mg/dL (ref 6–23)
CO2: 29 mEq/L (ref 19–32)
Calcium: 9.6 mg/dL (ref 8.4–10.5)
Chloride: 103 mEq/L (ref 96–112)
Creat: 1.23 mg/dL (ref 0.50–1.35)
Glucose, Bld: 118 mg/dL — ABNORMAL HIGH (ref 70–99)

## 2011-03-29 NOTE — Assessment & Plan Note (Addendum)
Primarily positional/orthostatic. From addition of savella until proven otherwise. Neuro exam normal today. Stop savella. Will also check for metabolic causes/contributors: lithium level, TSH, CMET, CBC.

## 2011-03-29 NOTE — Progress Notes (Signed)
OFFICE VISIT  03/29/2011   CC:  Chief Complaint  Patient presents with  . Follow-up    ED Saturday night, took nitro "Sunday morning     HPI:    Patient is a 75 y.o. Caucasian male who presents for f/u depression, started savella 3 wks ago.  His wife is here with him. Since starting savella he's noted no improvement in mood.  In fact, things seem worse physically--having more orthostatic dizziness without palpitations or presyncope, had a recent bout of urinary retention that required self cath/ED visit (subsequent urology f/u showed normal bladder on cysto 03/25/11).  Generalized weakness is an ongoing complaint but seems worse lately.  Intermittent feeling of unbalanced when walking.  No falls. Had CP in middle of the night about 5 d/a, mild SOB, mild sick feeling, walked around and tried tums but ended up finally taking sl nitro x 2 and the pain ceased.  He went back to sleep and hasn't had any problem since.  Other potentially contributing info/factors: did not take lasix for 4 consecutive days prior to his urinary retention. Mild diffuse abd discomfort, esp lower, since his urinary retention episode.  No n/v.  He does have chronic constipation (stool q3-4d that is hard and difficult to evacuate), without BRBPR or melena.    ROS: no fever, no change in baseline resp status, +having some HA's on and off but says this has always been a problem for him.  Past Medical History  Diagnosis Date  . CAD (coronary artery disease)     post CABG with prior percutaneous intervention of teh saphenous vein graft with known saphenous vein graft disease.  . AAA (abdominal aortic aneurysm) 2010    5.8cm  . Aneurysm of thoracic aorta 1995    Rupture 1995 w/spontaneous resolution  . Popliteal artery aneurysm   . Myocardial infarction   . Ischemic cardiomyopathy     EF 40%  . Paroxysmal atrial fibrillation   . Hypertension   . Peripheral vascular disease   . Dyslipidemia   . Diabetes mellitus  2007    dx'd 2007 w/persisting FBS>125  . GERD (gastroesophageal reflux disease)   . Hiatal hernia   . Diverticular disease   . BPH (benign prostatic hypertrophy)   . COPD (chronic obstructive pulmonary disease)     12" /28/09 PFT FEV1 1.84 (55%), FVC 4.42 (95%), TLC 6.46 (92%), DLCO 72%, no BD response  . Pneumonia 1/09  . Allergy   . Shingles   . OSA (obstructive sleep apnea) 06/14/09    PSG RDI 17, PLMI 96, intolerant of CPAP or BPAP  . Chronic renal insufficiency     Baseline Cr 1.5  . Prostate cancer 07/2008    Low grade; watchful waiting with [name]  . Pulmonary nodule, left 03/02/2010    Left base; resolved on f/u CT  . Allergic rhinitis     Past Surgical History  Procedure Date  . Cardiac defibrillator placement 08/28/2004    [name]  . Colon surgery 1995    partial colectomy  . Coronary artery bypass graft 1996  . Aorta - bilateral femoral artery bypass graft 1999    [name] Early  . Nose surgery   . Insertion of pacing lead     New rate sensing pacing lead with removal of a previous implanted ICD and insertion of a device back in the pocket with defibrillation threshold testing  . Abdominal aortic aneurysm repair 1999    Ingrarenal repair/ graft  .  Stents     Placed to the saphenous vein graft  . Radiofrequency ablation 2006    A flutter   Past FH and social history reviewed and appropriate SH changes were made.  Outpatient Prescriptions Prior to Visit  Medication Sig Dispense Refill  . acetaminophen (TYLENOL) 325 MG tablet Take 325 mg by mouth every 6 (six) hours as needed. For pain      . albuterol (PROVENTIL) (2.5 MG/3ML) 0.083% nebulizer solution Take 2.5 mg by nebulization every 6 (six) hours as needed. For shortness of breath      . amiodarone (PACERONE) 200 MG tablet Take 200 mg by mouth daily.        Marland Kitchen aspirin 81 MG tablet Take 81 mg by mouth daily.        Marland Kitchen BENICAR 20 MG tablet TAKE ONE-HALF TABLET BY MOUTH DAILY  30 tablet  2  .  budesonide (PULMICORT) 0.25 MG/2ML nebulizer solution Take 0.25 mg by nebulization 2 (two) times daily.        . carvedilol (COREG) 3.125 MG tablet Take 1 tablet (3.125 mg total) by mouth 2 (two) times daily.  60 tablet  11  . cetirizine (ZYRTEC) 10 MG tablet Take 10 mg by mouth daily.        . clotrimazole-betamethasone (LOTRISONE) cream Apply 1 application topically 2 (two) times daily as needed. For skin irritation      . cyanocobalamin 1000 MCG/ML injection Inject into the muscle every 30 (thirty) days.        Marland Kitchen desoximetasone (TOPICORT) 0.25 % cream Apply 1 application topically 2 (two) times daily as needed. For rash      . esomeprazole (NEXIUM) 40 MG capsule Take 1 capsule (40 mg total) by mouth daily.  30 capsule  11  . finasteride (PROSCAR) 5 MG tablet Take 5 mg by mouth daily.        . furosemide (LASIX) 40 MG tablet Take 40 mg by mouth daily.        Marland Kitchen guaiFENesin (MUCINEX) 600 MG 12 hr tablet Take 1,200 mg by mouth 2 (two) times daily as needed. For congestion      . guaiFENesin-dextromethorphan (ROBITUSSIN DM) 100-10 MG/5ML syrup Take 5 mLs by mouth 3 (three) times daily as needed. As needed for cough      . HYDROcodone-acetaminophen (NORCO) 5-325 MG per tablet Take 1-2 tablets by mouth every 6 (six) hours as needed. For pain       . ipratropium (ATROVENT) 0.03 % nasal spray Two sprays three times a day as needed before meals  30 mL  1  . isosorbide mononitrate (IMDUR) 60 MG 24 hr tablet Take 60 mg by mouth daily.        Marland Kitchen KLOR-CON 10 10 MEQ CR tablet TAKE 1 TABLET BY MOUTH EVERY DAY  30 tablet  6  . levalbuterol (XOPENEX HFA) 45 MCG/ACT inhaler Inhale 1-2 puffs into the lungs every 4 (four) hours as needed. For shortness of breath       . levothyroxine (SYNTHROID, LEVOTHROID) 125 MCG tablet Take 1 mcg by mouth as directed. Alternate 1 tab qd with 1/2 tab qd       . lithium 300 MG capsule Take 300 mg by mouth 2 (two) times daily with a meal.        . Menthol, Topical Analgesic, (ICY HOT  BACK) 5 % PADS Place 1 patch onto the skin daily as needed. For pain       . Milnacipran HCl (  SAVELLA TITRATION PACK) 12.5 & 25 & 50 MG MISC As directed  1 each  0  . mometasone (NASONEX) 50 MCG/ACT nasal spray Place 2 sprays into the nose 2 (two) times daily as needed. allergies       . nitroGLYCERIN (NITROSTAT) 0.3 MG SL tablet Place 0.3 mg under the tongue every 5 (five) minutes as needed. For chest pain      . NON FORMULARY 2 Act daily. Flutter Valve.       . NON FORMULARY as directed. Home nebulizer.       . rosuvastatin (CRESTOR) 5 MG tablet Take 5 mg by mouth once a week. Sundays      . sitaGLIPtin (JANUVIA) 100 MG tablet Take 1 tablet (100 mg total) by mouth daily.  30 tablet  3  . Tamsulosin HCl (FLOMAX) 0.4 MG CAPS Take 0.4 mg by mouth daily.        Marland Kitchen tiotropium (SPIRIVA) 18 MCG inhalation capsule Place 18 mcg into inhaler and inhale daily.        Marland Kitchen warfarin (COUMADIN) 5 MG tablet          Allergies  Allergen Reactions  . Atorvastatin     REACTION: muscle ache in legs  . Crestor (Rosuvastatin Calcium) Other (See Comments)    Lower ext fatigue and soreness  . Lisinopril Diarrhea  . Morphine     REACTION: AMS - agitation  . Prednisone     REACTION: Can't breath  . Zolpidem Tartrate     REACTION: AMS    ROS As per HPI  PE: Blood pressure 120/68, pulse 66, height 5' 10.5" (1.791 m), weight 160 lb (72.576 kg), SpO2 97.00%. Gen: Alert, well appearing.  Patient is oriented to person, place, time, and situation.  Pleasant.  Lucid thought and conversation.  Eyes: no injection, icteris, swelling, or exudate.  EOMI, PERRLA. Nose: no drainage or turbinate edema/swelling.  No injection or focal lesion.  Mouth: lips without lesion/swelling.  Neck - No masses or thyromegaly or limitation in range of motion RRR, without murmur, rub, or gallop. Carotid pulses easily palpable, symmetric pulsations, no bruit or palpable dilatation. Radial, femoral, and ankle pulses easily palpable and  symmetric. No abdominal bruit. Chest with symmetric expansion, good aeration, nonlabored respirations.  Clear and equal breath sounds in all lung fields.  No clubbing or cyanosis. ABD: soft, Nondistended.  No mass.  Mild diffuse abd TTP, lower>upper.  No guarding or rebound.   EXT: no clubbing, cyanosis, or edema.  Rectal exam: negative without mass, lesions or tenderness.  Stool wipings light brown, hemoccult neg. PROSTATE EXAM: smooth and symmetric without nodules or tenderness, moderately enlarged.  LABS:  12 lead EKG showed sinus bradycardia, rate 59, LAFB, poor R wave progression, Q waves in V1-V3, nonspecific T wave changes --UNCHANGED COMPARED TO 12/2010.  IMPRESSION AND PLAN:  Reviewed ED records and urology records from 03/23/11 and 03/25/11, respectively.  Dizziness Primarily positional/orthostatic. From addition of savella until proven otherwise. Neuro exam normal today. Stop savella. Will also check for metabolic causes/contributors: lithium level, TSH, CMET, CBC.  CAD, ARTERY BYPASS GRAFT With ischemic cardiomyopathy and paroxysmal a-fib.  I think his CV function is stable, not contributing to current condition/symptoms. Reassured pt/wife.  They have cardiology f/u set already with Dr. Riley Kill for 04/08/11. Continue all current meds.  Depression Stable.  Ok to take off savella. We can try a different antidepressant possibly in the future when he's back to feeling better physically.  Constipation He'll start  colace daily for now Reassured him that prn dulcolax is okay but at this time he should hold off. No stool palpable in rectal vault on today's DRE.  Urinary retention Resolved.  This is secondary to prostate cancer (low grade) + BPH. Savella could have potentially made this worse, leading to acute UR. He can self cath if needed.  Urology f/u after recent ED visit was reassuring (cysto normal).  DIABETES MELLITUS, TYPE II This has historically been very  stable. Due for HbA1c.  This was added to his labs today.    Will be in contact with wife/pt by phone in the next few days and if dizziness and unsteady walking issues worsen then will proceed with neuroimaging.  FOLLOW UP: Return in about 1 week (around 04/05/2011).

## 2011-03-29 NOTE — Assessment & Plan Note (Signed)
With ischemic cardiomyopathy and paroxysmal a-fib.  I think his CV function is stable, not contributing to current condition/symptoms. Reassured pt/wife.  They have cardiology f/u set already with Dr. Riley Kill for 04/08/11. Continue all current meds.

## 2011-03-29 NOTE — Assessment & Plan Note (Signed)
Stable.  Ok to take off savella. We can try a different antidepressant possibly in the future when he's back to feeling better physically.

## 2011-03-29 NOTE — Assessment & Plan Note (Signed)
This has historically been very stable. Due for HbA1c.  This was added to his labs today.

## 2011-03-29 NOTE — Assessment & Plan Note (Signed)
Resolved.  This is secondary to prostate cancer (low grade) + BPH. Savella could have potentially made this worse, leading to acute UR. He can self cath if needed.  Urology f/u after recent ED visit was reassuring (cysto normal).

## 2011-03-29 NOTE — Assessment & Plan Note (Signed)
He'll start colace daily for now Reassured him that prn dulcolax is okay but at this time he should hold off. No stool palpable in rectal vault on today's DRE.

## 2011-03-30 LAB — CBC WITH DIFFERENTIAL/PLATELET
Basophils Absolute: 0 10*3/uL (ref 0.0–0.1)
Eosinophils Absolute: 0.2 10*3/uL (ref 0.0–0.7)
Eosinophils Relative: 3 % (ref 0–5)
Lymphocytes Relative: 12 % (ref 12–46)
MCH: 30.8 pg (ref 26.0–34.0)
MCV: 99.5 fL (ref 78.0–100.0)
Platelets: 145 10*3/uL — ABNORMAL LOW (ref 150–400)
RDW: 14.9 % (ref 11.5–15.5)
WBC: 6.3 10*3/uL (ref 4.0–10.5)

## 2011-03-31 ENCOUNTER — Other Ambulatory Visit: Payer: Self-pay | Admitting: Cardiology

## 2011-04-03 ENCOUNTER — Encounter: Payer: Self-pay | Admitting: Family Medicine

## 2011-04-03 ENCOUNTER — Ambulatory Visit (INDEPENDENT_AMBULATORY_CARE_PROVIDER_SITE_OTHER): Payer: Medicare Other | Admitting: Family Medicine

## 2011-04-03 VITALS — BP 99/70 | HR 63 | Ht 70.5 in | Wt 159.0 lb

## 2011-04-03 DIAGNOSIS — K5909 Other constipation: Secondary | ICD-10-CM

## 2011-04-03 DIAGNOSIS — K5900 Constipation, unspecified: Secondary | ICD-10-CM

## 2011-04-03 DIAGNOSIS — R42 Dizziness and giddiness: Secondary | ICD-10-CM

## 2011-04-03 MED ORDER — LUBIPROSTONE 24 MCG PO CAPS: 24.0000 ug | ORAL_CAPSULE | Freq: Two times a day (BID) | ORAL | Status: DC

## 2011-04-03 MED ORDER — IPRATROPIUM BROMIDE 0.03 % NA SOLN: NASAL | Status: DC

## 2011-04-04 ENCOUNTER — Encounter: Payer: Self-pay | Admitting: Family Medicine

## 2011-04-04 DIAGNOSIS — K5909 Other constipation: Secondary | ICD-10-CM | POA: Insufficient documentation

## 2011-04-04 IMAGING — CT CT HEART MORP W/ CTA COR W/ SCORE W/ CA W/CM &/OR W/O CM
2 of 3 series · 11 of 20 positions shown, 12 images · non-contrast
Comparison: Plain film chest of 04/10/2009.  Prior chest CT of
05/25/2007

Addendum Begins

CARDIAC CTA WITH CALCIUM SCORE 04/14/2009 [DATE]
Ordering Physician: Hevia
Gemmi Physician: Don [email or web address].[email or web address]
PROTOCOL: The patient scanned on a Siemens sensations 64 slice
scanner.  Gantry rotation speed was 320 milliseconds.  Collimation
was [DATE] mm . Reconstruction overlap was [DATE] mm.   5
mg of IV Lopressor was administered.  Average heart rate during the
scan was 60 beats per minute.  After an initial AP and lateral
topogram, 3 mm axial slices were performed through the heart for
calcium scoring.  The patient then received 20 ml of contrast for a
timing bolus with a region of interest in the ascending aorta.  A
delay of 21 seconds was used.  The patient then had a 80 ml of
contrast given for coronary CTA.  The 3-D data set was then sent to
the [employer] workstation.  Reconstructions were done using
MIP,MPR and VRT modes.

[Series 7: soft tissue · axial · 0.82mm/px · z∈[-266,-41]mm · 3 of 76 slices shown, 4 images]
[im 1/76  vessel]
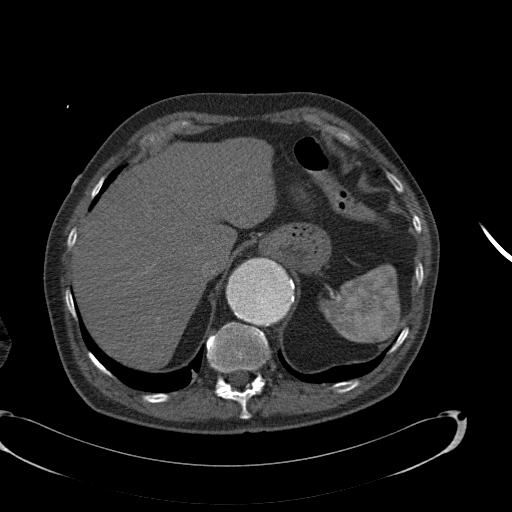
[im 1/76  lung]
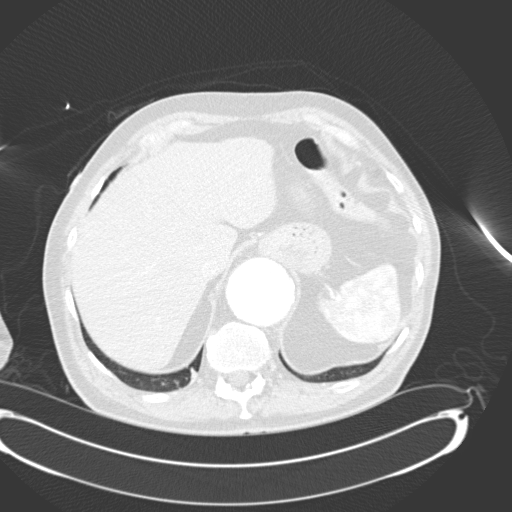
[im 38/76  vessel]
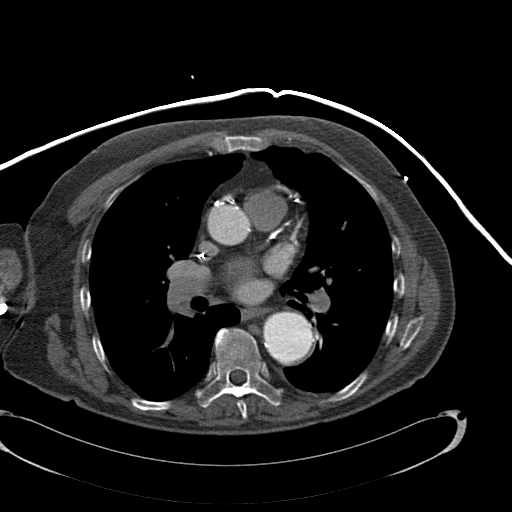
[im 76/76  vessel]
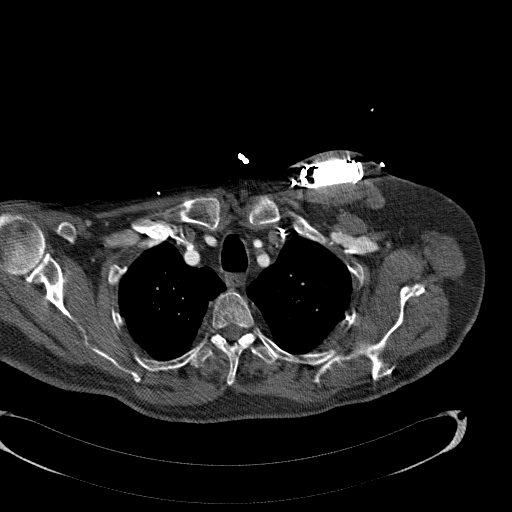

[Series 12: 70% only · axial · 0.38mm/px · z∈[-228,-64]mm · 8 of 482 slices shown]
[im 35/482  vessel]
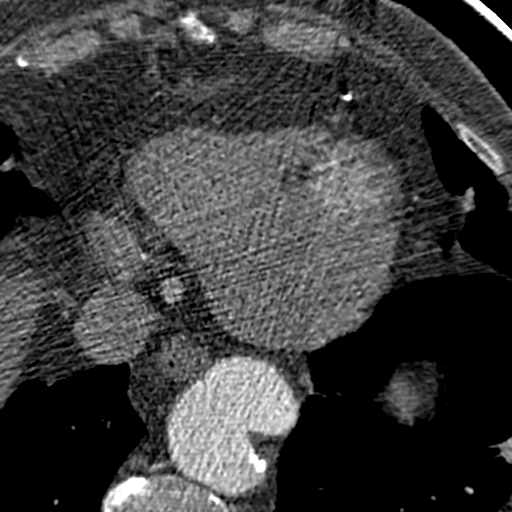
[im 104/482  vessel]
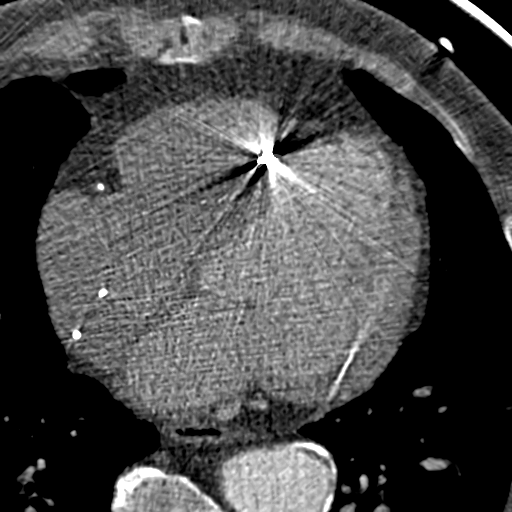
[im 172/482  vessel]
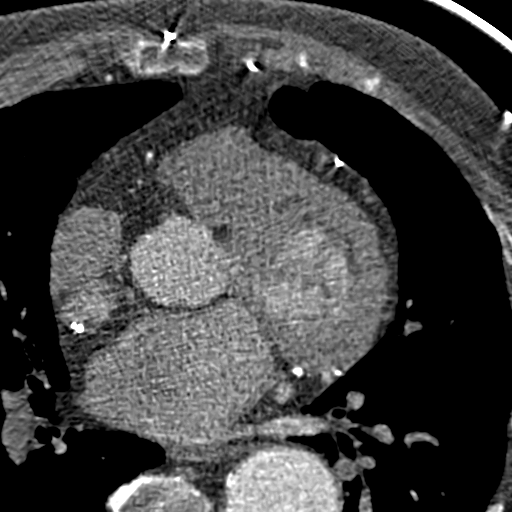
[im 207/482  vessel]
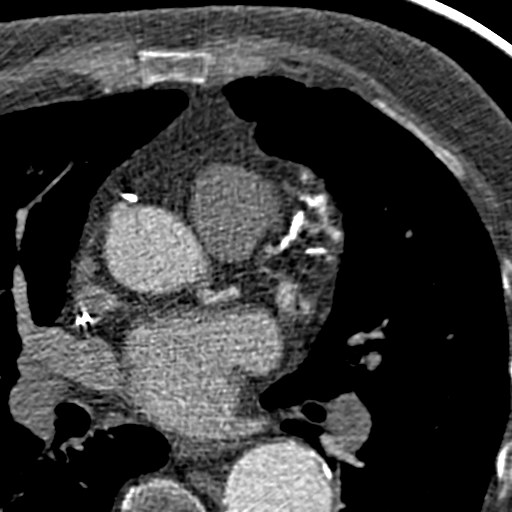
[im 275/482  vessel]
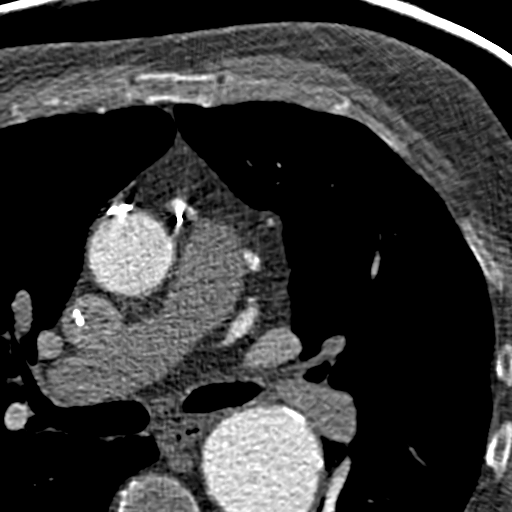
[im 310/482  vessel]
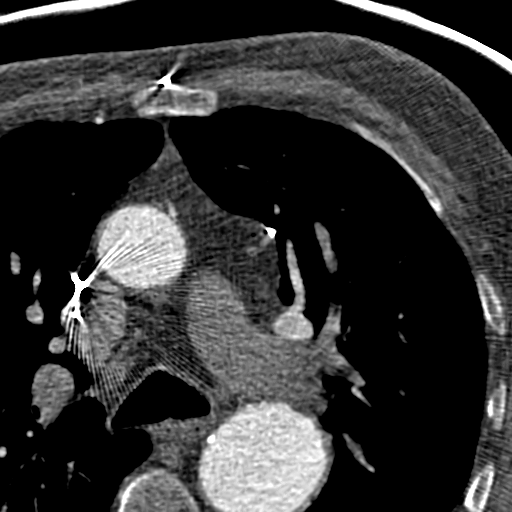
[im 378/482  vessel]
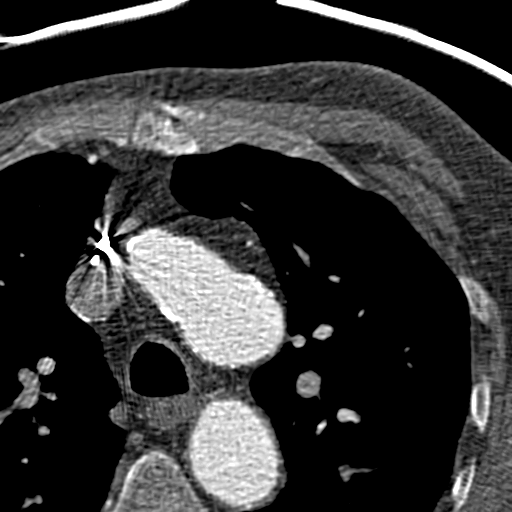
[im 447/482  vessel]
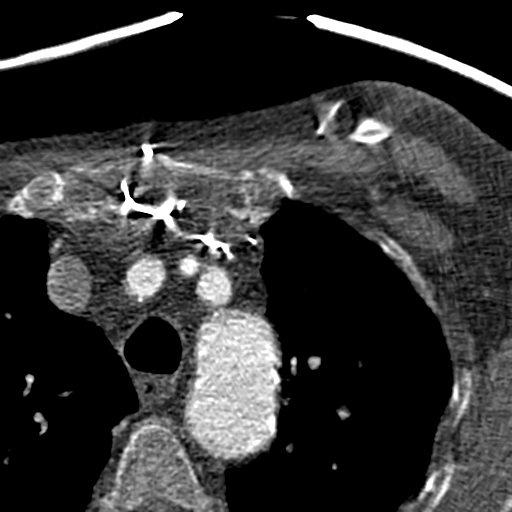

[11 of 20 positions shown; findings below may reference images not displayed]

Indications: Inability to cannulate [REDACTED] graft

DETAILED FINDINGS:

Quality of Study: Fair, respiratory artifact was present

Left Main: Noncalcified plaque with no stenosis.

Left Anterior Descending: The proximal LAD had prominent calcified
plaque but was patent.  The mid LAD was totally occluded.  The [REDACTED]
graft to the distal LAD was patent.  There was run-off in the
distal LAD.  The distal LAD was not well-visualized in portions due
to artifact from the pacemaker in the RV.

Left Circumflex: The proximal circumflex had severe 70+% stenosis
with mixed plaque.  There were two obtuse marginals that were
occluded proximally.

Right Coronary Artery: The right coronary artery had severe
stenosis with mixed plaque proximally.  The RCA appeared to be
totally occluded in the mid portion.

Grafts:  There was a patent [REDACTED]-LAD with patent touchdown on the
distal LAD.  There was a saphenous vein graft to the RCA that was
totally occluded proximally.  There was a saphenous vein graft to
OM1 and OM2.  This graft had a patent stent in its mid-portion.
There was mild stenosis in the graft.  The touchdowns to OM1 and
OM2 appeared patent but the vessels after the touchdowns were not
well-visualized. There was a saphenous vein graft to D1 and D1.
The ostial graft was not well-visualized due to artifact from the
graft marker.  This vessel had diffuse mild to moderate stenosis
with primarily soft plaque.  The touchdowns on the diagonals
appeared patent.
IMPRESSION: 1.  Severe native vessel CAD

2.  Patent [REDACTED]-LAD.

3. Totally occluded OPPENHEIMER.

4. Patent sequential FOURSEASONS to OM1 and OM2 with mild stenosis and with
a patent stent in the mid-portion of the graft.  OM1 and OM2 after
the touchdown of the grafts were not well-visualized.

5. Patent sequential FOURSEASONS to D1 and D2.  The ostial vessel was not
well-visualized due to artifact from graft marker. There was
diffuse mild-moderate stenosis in the mid to distal graft.

Addendum Ends
OVER-READ INTERPRETATION - CT CHEST

The following report is an over-read performed by radiologist Dr.
[DATE].  This over-read does not include interpretation of
cardiac or coronary anatomy or pathology.  The CTA interpretation
by the cardiologist is attached.
FINDINGS: Lung windows demonstrate mild similar nonspecific
bronchial wall thickening to the lower lobes.  Moderate
centrilobular emphysema.  Volume loss and scarring in the lung
bases bilaterally. A vague left lower lobe lung nodule measures 6
mm on image 48.  Not definitely present on the prior exam.  There
is also a vague 4 mm left lower lobe lung nodule image 35.  This is
likely obscured by atelectasis on the prior.

Soft tissue windows demonstrate similar normal appearance of the
ascending aorta.  Left apical infarct and mild cardiomegaly. No
pericardial or pleural effusion.  Similar ectasia of the proximal
transverse aorta which measures 3.5 cm on image 18 of series 7.
There is also dilatation of the proximal descending thoracic aorta.
This measures 4.6 cm on image 58 sagittal.  4.5 cm on the prior
exam at same level.  The more distal descending thoracic aorta is
also tortuous and dilated.  This measures 4.5 cm on sagittal image
58 and compares with 4.3 cm on the prior exam. A focal area of
aortic outpouching of the descending thoracic aorta is unchanged on
coronal image 53.

Aorta then returns to a more normal caliber at the level of the
diaphragmatic hiatus.  Here, it measures 5.6 x 5.7 cm on image 76
transverse series 7.  This is slightly enlarged from 5.0 x 5.0 cm
on the prior exam.

Limited abdominal imaging demonstrates no significant findings.  A
small hiatal hernia. No acute osseous abnormality. Mild osteopenia.
IMPRESSION: 1.  Multifocal irregularity of the transverse and descending
thoracic aorta.  The majority of these findings are similar to on
the 05/25/2007 exam.  There is however, increased
dilatation/aneurysm at the level of the diaphragmatic hiatus.  This
dilatation is incompletely imaged.  Of note, it was described on
the ultrasound of 04/11/2009.
2.  Lung nodules up to 6 mm.  Given the concurrent centrilobular
emphysema, follow-up chest CT at 6 - 12 months is recommended.
This recommendation follows the consensus statement: "Guidelines
for Management of Small Pulmonary Nodules Detected on CT Scans: A
Statement from the [HOSPITAL]" as published in Radiology
7771; [DATE]. Online at:
[URL]

## 2011-04-04 NOTE — Assessment & Plan Note (Signed)
Continue OTC fiber supplement. Start amitiza bid trial--samples given.  Therapeutic expectations and side effect profile of medication discussed today.  Patient's questions answered.  Recheck 2 wks.

## 2011-04-04 NOTE — Progress Notes (Signed)
OFFICE NOTE  04/04/2011  CC:  Chief Complaint  Patient presents with  . Follow-up    dizziness     HPI:   Patient is a 75 y.o. Caucasian male who is here for f/u recent episodic dizziness, unsteady walking, mild generalized malaise. We had attributed this to savella, which we had started a couple of weeks back. We stopped the med, made sure his labs looked stable, EKG was stable, exam reassuring--he reports feeling much improved since d/c of savella.  No longer having dizzy spells, doesn't feel unsteady when walking.  No urinary retention prob.  Still not back to feeling 100%, though.   He does c/o of ongoing chronic constipation: years of infrequent, hard stools with excessive straining to evacuate.  No BRBPR or melena. Last o/v I did rectal exam which was normal, including hemoccult negative. He is using OTC fiber supplement.  He has dulcolax but is not using it.  Pertinent PMH:  Past Medical History  Diagnosis Date  . CAD (coronary artery disease)     post CABG with prior percutaneous intervention of teh saphenous vein graft with known saphenous vein graft disease.  Marland Kitchen AAA (abdominal aortic aneurysm) 2010    5.8cm  . Aneurysm of thoracic aorta 1995    Rupture 1995 w/spontaneous resolution  . Popliteal artery aneurysm   . Myocardial infarction   . Ischemic cardiomyopathy   . Paroxysmal atrial fibrillation   . Hypertension   . Peripheral vascular disease   . Dyslipidemia   . Diabetes mellitus 2007    dx'd 2007 w/persisting FBS>125  . GERD (gastroesophageal reflux disease)   . Hiatal hernia   . Diverticular disease   . BPH (benign prostatic hypertrophy)     with hx of acute urinary retention (pt can self-cath)  . COPD (chronic obstructive pulmonary disease)     05/09/08 PFT FEV1 1.84 (55%), FVC 4.42 (95%), TLC 6.46 (92%), DLCO 72%, no BD response  . Pneumonia 1/09  . Allergic rhinitis   . Shingles   . OSA (obstructive sleep apnea) 06/14/09    PSG RDI 17, PLMI 96,  intolerant of CPAP or BPAP  . Chronic renal insufficiency     Baseline Cr 1.5  . Prostate cancer 07/2008    Low grade; watchful waiting with [name]  . Pulmonary nodule, left 03/02/2010    Left base; resolved on f/u CT  . Allergic rhinitis   . Carpal tunnel syndrome of left wrist 02/11/2011  . IMPLANTATION OF DEFIBRILLATOR, HX OF 09/02/2008  . IRON DEFICIENCY 06/08/2010    Hemoccult negative:  GI ([name]) recommended no invasive diagnostics--watchful waiting (2012).  . UNSPECIFIED ANEMIA 06/01/2010    Multifactorial: iron def, CRI, vit B12 def,    Past surgical, family, and social history reviewed and there are no changes since their last office visit.  MEDS;   Outpatient Prescriptions Prior to Visit  Medication Sig Dispense Refill  . acetaminophen (TYLENOL) 325 MG tablet Take 325 mg by mouth every 6 (six) hours as needed. For pain      . albuterol (PROVENTIL) (2.5 MG/3ML) 0.083% nebulizer solution Take 2.5 mg by nebulization every 6 (six) hours as needed. For shortness of breath      . amiodarone (PACERONE) 200 MG tablet Take 200 mg by mouth daily.        Marland Kitchen aspirin 81 MG tablet Take 81 mg by mouth daily.        Marland Kitchen BENICAR 20 MG tablet TAKE ONE-HALF TABLET BY MOUTH  DAILY  30 tablet  3  . budesonide (PULMICORT) 0.25 MG/2ML nebulizer solution Take 0.25 mg by nebulization 2 (two) times daily.        . carvedilol (COREG) 3.125 MG tablet Take 1 tablet (3.125 mg total) by mouth 2 (two) times daily.  60 tablet  11  . cetirizine (ZYRTEC) 10 MG tablet Take 10 mg by mouth daily.        . clotrimazole-betamethasone (LOTRISONE) cream Apply 1 application topically 2 (two) times daily as needed. For skin irritation      . cyanocobalamin 1000 MCG/ML injection Inject into the muscle every 30 (thirty) days.        Marland Kitchen desoximetasone (TOPICORT) 0.25 % cream Apply 1 application topically 2 (two) times daily as needed. For rash      . esomeprazole (NEXIUM) 40 MG capsule Take 1 capsule (40 mg total)  by mouth daily.  30 capsule  11  . finasteride (PROSCAR) 5 MG tablet Take 5 mg by mouth daily.        . furosemide (LASIX) 40 MG tablet Take 40 mg by mouth daily.        Marland Kitchen guaiFENesin (MUCINEX) 600 MG 12 hr tablet Take 1,200 mg by mouth 2 (two) times daily as needed. For congestion      . guaiFENesin-dextromethorphan (ROBITUSSIN DM) 100-10 MG/5ML syrup Take 5 mLs by mouth 3 (three) times daily as needed. As needed for cough      . HYDROcodone-acetaminophen (NORCO) 5-325 MG per tablet Take 1-2 tablets by mouth every 6 (six) hours as needed. For pain       . ipratropium (ATROVENT) 0.03 % nasal spray Two sprays three times a day as needed before meals  30 mL  1  . isosorbide mononitrate (IMDUR) 60 MG 24 hr tablet Take 60 mg by mouth daily.        Marland Kitchen KLOR-CON 10 10 MEQ CR tablet TAKE 1 TABLET BY MOUTH EVERY DAY  30 tablet  6  . levalbuterol (XOPENEX HFA) 45 MCG/ACT inhaler Inhale 1-2 puffs into the lungs every 4 (four) hours as needed. For shortness of breath       . levothyroxine (SYNTHROID, LEVOTHROID) 125 MCG tablet Take 1 mcg by mouth as directed. Alternate 1 tab qd with 1/2 tab qd       . lithium 300 MG capsule Take 300 mg by mouth 2 (two) times daily with a meal.        . Menthol, Topical Analgesic, (ICY HOT BACK) 5 % PADS Place 1 patch onto the skin daily as needed. For pain       . mometasone (NASONEX) 50 MCG/ACT nasal spray Place 2 sprays into the nose 2 (two) times daily as needed. allergies       . nitroGLYCERIN (NITROSTAT) 0.3 MG SL tablet Place 0.3 mg under the tongue every 5 (five) minutes as needed. For chest pain      . NON FORMULARY 2 Act daily. Flutter Valve.       . NON FORMULARY as directed. Home nebulizer.       . rosuvastatin (CRESTOR) 5 MG tablet Take 5 mg by mouth once a week. Sundays      . sitaGLIPtin (JANUVIA) 100 MG tablet Take 1 tablet (100 mg total) by mouth daily.  30 tablet  3  . Tamsulosin HCl (FLOMAX) 0.4 MG CAPS Take 0.4 mg by mouth daily.        Marland Kitchen tiotropium  (SPIRIVA) 18 MCG inhalation capsule Place  18 mcg into inhaler and inhale daily.        Marland Kitchen warfarin (COUMADIN) 5 MG tablet        . Milnacipran HCl (SAVELLA TITRATION PACK) 12.5 & 25 & 50 MG MISC As directed  1 each  0  **Note: no longer taking savella  PE: Blood pressure 99/70, pulse 63, height 5' 10.5" (1.791 m), weight 159 lb (72.122 kg), SpO2 97.00%. Gen: Alert, well appearing.  Patient is oriented to person, place, time, and situation. CV: RRR, no m/r/g.   LUNGS: CTA bilat, nonlabored resps, good aeration in all lung fields. Gait: no ataxia.  LABS: none today. Lab Results  Component Value Date   WBC 6.3 03/29/2011   HGB 12.8* 03/29/2011   HCT 41.4 03/29/2011   MCV 99.5 03/29/2011   PLT 145* 03/29/2011     Chemistry      Component Value Date/Time   NA 137 03/29/2011 1041   K 4.2 03/29/2011 1041   CL 103 03/29/2011 1041   CO2 29 03/29/2011 1041   BUN 20 03/29/2011 1041   CREATININE 1.23 03/29/2011 1041   CREATININE 1.20 03/23/2011 1406      Component Value Date/Time   CALCIUM 9.6 03/29/2011 1041   ALKPHOS 54 03/29/2011 1041   AST 14 03/29/2011 1041   ALT 14 03/29/2011 1041   BILITOT 0.5 03/29/2011 1041     Lab Results  Component Value Date   TSH 0.937 03/29/2011   Lithium level 1.03 03/29/11.   IMPRESSION AND PLAN:  Constipation, chronic Continue OTC fiber supplement. Start amitiza bid trial--samples given.  Therapeutic expectations and side effect profile of medication discussed today.  Patient's questions answered.  Recheck 2 wks.  Dizziness Nonspecific malaise/dizziness/unsteady gait---all likely due to savella (side effects). Med d/c'd and symptoms resolving. We'll revisit depression treatment when he is feeling back to baseline.     FOLLOW UP:  Return in about 2 weeks (around 04/17/2011) for f/u depression and constipation.

## 2011-04-04 NOTE — Assessment & Plan Note (Signed)
Nonspecific malaise/dizziness/unsteady gait---all likely due to savella (side effects). Med d/c'd and symptoms resolving. We'll revisit depression treatment when he is feeling back to baseline.

## 2011-04-08 ENCOUNTER — Encounter: Payer: Medicare Other | Admitting: *Deleted

## 2011-04-08 ENCOUNTER — Ambulatory Visit: Payer: Medicare Other | Admitting: Cardiology

## 2011-04-11 ENCOUNTER — Encounter: Payer: Self-pay | Admitting: Cardiology

## 2011-04-11 ENCOUNTER — Ambulatory Visit (INDEPENDENT_AMBULATORY_CARE_PROVIDER_SITE_OTHER): Payer: Medicare Other | Admitting: *Deleted

## 2011-04-11 ENCOUNTER — Ambulatory Visit (INDEPENDENT_AMBULATORY_CARE_PROVIDER_SITE_OTHER): Payer: Medicare Other | Admitting: Cardiology

## 2011-04-11 VITALS — BP 128/71 | HR 69 | Resp 18 | Ht 67.0 in | Wt 162.1 lb

## 2011-04-11 DIAGNOSIS — I712 Thoracic aortic aneurysm, without rupture: Secondary | ICD-10-CM

## 2011-04-11 DIAGNOSIS — Z7901 Long term (current) use of anticoagulants: Secondary | ICD-10-CM

## 2011-04-11 DIAGNOSIS — I4891 Unspecified atrial fibrillation: Secondary | ICD-10-CM

## 2011-04-11 DIAGNOSIS — I2589 Other forms of chronic ischemic heart disease: Secondary | ICD-10-CM

## 2011-04-11 DIAGNOSIS — I2510 Atherosclerotic heart disease of native coronary artery without angina pectoris: Secondary | ICD-10-CM

## 2011-04-11 NOTE — Patient Instructions (Signed)
Your physician recommends that you schedule a follow-up appointment in: 3 MONTHS  Your physician recommends that you continue on your current medications as directed. Please refer to the Current Medication list given to you today.   

## 2011-04-17 ENCOUNTER — Ambulatory Visit (INDEPENDENT_AMBULATORY_CARE_PROVIDER_SITE_OTHER): Payer: Medicare Other | Admitting: Family Medicine

## 2011-04-17 ENCOUNTER — Encounter: Payer: Self-pay | Admitting: Family Medicine

## 2011-04-17 VITALS — BP 113/71 | HR 71 | Temp 98.0°F | Ht 70.5 in | Wt 159.0 lb

## 2011-04-17 DIAGNOSIS — K5909 Other constipation: Secondary | ICD-10-CM

## 2011-04-17 DIAGNOSIS — K5900 Constipation, unspecified: Secondary | ICD-10-CM

## 2011-04-17 DIAGNOSIS — J069 Acute upper respiratory infection, unspecified: Secondary | ICD-10-CM

## 2011-04-17 MED ORDER — LUBIPROSTONE 24 MCG PO CAPS: 24.0000 ug | ORAL_CAPSULE | Freq: Every day | ORAL | Status: DC

## 2011-04-17 NOTE — Progress Notes (Signed)
OFFICE NOTE  04/17/2011  CC:  Chief Complaint  Patient presents with  . Follow-up  . URI    cough, sneezing, congestion, runny nose x 6 days     HPI:   Patient is a 75 y.o. Caucasian male who is here for f/u constipation. Also has URI sx's.  Onset of nasal congestion/nasal mucous/sneezing about 6 d/a.  No change in baseline COPD sx's: minimal cough, no SOB, no fever.  Wife with similar illness.  Using atrovent nasal spray 2-3 times per day and it helps.  No ST, no HA, no face/upper teeth pain.  Still doing good OFF of Savella--his dizziness, malaise, gait problems all resolved off of this med.  Amitiza 24 mcg bid didn't bring much change that he could see, then about 4-5 days after starting it he started to get loose BMs, 3-4 in one night initially, then just 1-2 for a few days.  He then stopped the amitiza and BMs firming up some.  No fever or n/v or abd pain.  Pertinent PMH:  Past medical,surgical, family, and social history reviewed and there are no changes since the patient's last office visit with me.  MEDS;   Outpatient Prescriptions Prior to Visit  Medication Sig Dispense Refill  . acetaminophen (TYLENOL) 325 MG tablet Take 325 mg by mouth every 6 (six) hours as needed. For pain      . albuterol (PROVENTIL) (2.5 MG/3ML) 0.083% nebulizer solution Take 2.5 mg by nebulization every 6 (six) hours as needed. For shortness of breath      . amiodarone (PACERONE) 200 MG tablet Take 200 mg by mouth daily.        Marland Kitchen aspirin 81 MG tablet Take 81 mg by mouth daily.        Marland Kitchen BENICAR 20 MG tablet TAKE ONE-HALF TABLET BY MOUTH DAILY  30 tablet  3  . budesonide (PULMICORT) 0.25 MG/2ML nebulizer solution Take 0.25 mg by nebulization 2 (two) times daily.        . carvedilol (COREG) 3.125 MG tablet Take 1 tablet (3.125 mg total) by mouth 2 (two) times daily.  60 tablet  11  . cetirizine (ZYRTEC) 10 MG tablet Take 10 mg by mouth daily.        . clotrimazole-betamethasone (LOTRISONE) cream Apply  1 application topically 2 (two) times daily as needed. For skin irritation      . cyanocobalamin 1000 MCG/ML injection Inject into the muscle every 30 (thirty) days.        Marland Kitchen desoximetasone (TOPICORT) 0.25 % cream Apply 1 application topically 2 (two) times daily as needed. For rash      . esomeprazole (NEXIUM) 40 MG capsule Take 1 capsule (40 mg total) by mouth daily.  30 capsule  11  . finasteride (PROSCAR) 5 MG tablet Take 5 mg by mouth daily.        . furosemide (LASIX) 40 MG tablet Take 40 mg by mouth daily.        Marland Kitchen guaiFENesin (MUCINEX) 600 MG 12 hr tablet Take 1,200 mg by mouth 2 (two) times daily as needed. For congestion      . guaiFENesin-dextromethorphan (ROBITUSSIN DM) 100-10 MG/5ML syrup Take 5 mLs by mouth 3 (three) times daily as needed. As needed for cough      . HYDROcodone-acetaminophen (NORCO) 5-325 MG per tablet Take 1-2 tablets by mouth every 6 (six) hours as needed. For pain       . ipratropium (ATROVENT) 0.03 % nasal spray Two sprays  three times a day as needed before meals  30 mL  1  . isosorbide mononitrate (IMDUR) 60 MG 24 hr tablet Take 60 mg by mouth daily.        Marland Kitchen KLOR-CON 10 10 MEQ CR tablet TAKE 1 TABLET BY MOUTH EVERY DAY  30 tablet  6  . levalbuterol (XOPENEX HFA) 45 MCG/ACT inhaler Inhale 1-2 puffs into the lungs every 4 (four) hours as needed. For shortness of breath       . levothyroxine (SYNTHROID, LEVOTHROID) 125 MCG tablet Take 1 mcg by mouth as directed. Alternate 1 tab qd with 1/2 tab qd       . lithium 300 MG capsule Take 300 mg by mouth 2 (two) times daily with a meal.        . Menthol, Topical Analgesic, (ICY HOT BACK) 5 % PADS Place 1 patch onto the skin daily as needed. For pain       . mometasone (NASONEX) 50 MCG/ACT nasal spray Place 2 sprays into the nose 2 (two) times daily as needed. allergies       . nitroGLYCERIN (NITROSTAT) 0.3 MG SL tablet Place 0.3 mg under the tongue every 5 (five) minutes as needed. For chest pain      . NON FORMULARY 2  Act daily. Flutter Valve.       . NON FORMULARY as directed. Home nebulizer.       . rosuvastatin (CRESTOR) 5 MG tablet Take 5 mg by mouth once a week. Sundays      . sitaGLIPtin (JANUVIA) 100 MG tablet Take 1 tablet (100 mg total) by mouth daily.  30 tablet  3  . Tamsulosin HCl (FLOMAX) 0.4 MG CAPS Take 0.4 mg by mouth daily.        Marland Kitchen tiotropium (SPIRIVA) 18 MCG inhalation capsule Place 18 mcg into inhaler and inhale daily.        Marland Kitchen warfarin (COUMADIN) 5 MG tablet        . lubiprostone (AMITIZA) 24 MCG capsule Take 1 capsule (24 mcg total) by mouth 2 (two) times daily with a meal.  30 capsule  0    PE: Blood pressure 113/71, pulse 71, temperature 98 F (36.7 C), temperature source Oral, height 5' 10.5" (1.791 m), weight 159 lb (72.122 kg), SpO2 95.00%. VS: noted--normal. Gen: alert, NAD, well appearing. HEENT: eyes without injection, drainage, or swelling.  Ears: EACs clear, TMs with normal light reflex and landmarks.  Nose: No visible mucous.  His turbinates are diffusely very edematous and mildly injected.  No purulent d/c.  No paranasal sinus TTP.  No facial swelling.  Throat and mouth without focal lesion.  No pharyngial swelling, erythema, or exudate.   Neck: supple, no LAD.   LUNGS: CTA bilat, nonlabored resps.  Great aeration in inspiration and expiration.  Exp phase not prolonged. CV: RRR, no m/r/g. EXT: no c/c/e SKIN: no rash    IMPRESSION AND PLAN:  Viral URI No sign of bacterial infection or exacerbation of COPD. Continue atrovent nasal spray up to qid and restart nasonex qd. Call if not showing improvement in 2 days or if worse before then.  Constipation, chronic Difficult to tell if amitiza helping.  Seems to have been on it over a week w/out much effect, then started having loose BMs--? Mild viral enteritis vs amitiza effect?  He stopped amitiza at onset of loose BMs 3d/a.   When loose BMs stop, he'll wait a day or so and restart amitiza at one  24 mcg cap  qd.   Depression: mood stable.  No new antidepressant trial at this time. Intolerant of savella.  FOLLOW UP:  Return in about 2 months (around 06/18/2011) for chronic f/u.

## 2011-04-17 NOTE — Assessment & Plan Note (Signed)
Difficult to tell if amitiza helping.  Seems to have been on it over a week w/out much effect, then started having loose BMs--? Mild viral enteritis vs amitiza effect?  He stopped amitiza at onset of loose BMs 3d/a.   When loose BMs stop, he'll wait a day or so and restart amitiza at one 24 mcg cap qd.

## 2011-04-17 NOTE — Assessment & Plan Note (Signed)
No sign of bacterial infection or exacerbation of COPD. Continue atrovent nasal spray up to qid and restart nasonex qd. Call if not showing improvement in 2 days or if worse before then.

## 2011-04-18 ENCOUNTER — Other Ambulatory Visit: Payer: Self-pay | Admitting: *Deleted

## 2011-04-18 DIAGNOSIS — E119 Type 2 diabetes mellitus without complications: Secondary | ICD-10-CM

## 2011-04-18 MED ORDER — SITAGLIPTIN PHOSPHATE 100 MG PO TABS: 100.0000 mg | ORAL_TABLET | Freq: Every day | ORAL | Status: DC

## 2011-04-18 NOTE — Telephone Encounter (Signed)
Last seen 04/17/11, follow up on 06/17/10.  RX sent.

## 2011-04-26 ENCOUNTER — Telehealth: Payer: Self-pay

## 2011-04-26 ENCOUNTER — Ambulatory Visit: Payer: Medicare Other

## 2011-04-26 ENCOUNTER — Ambulatory Visit (INDEPENDENT_AMBULATORY_CARE_PROVIDER_SITE_OTHER): Payer: Medicare Other | Admitting: *Deleted

## 2011-04-26 DIAGNOSIS — D510 Vitamin B12 deficiency anemia due to intrinsic factor deficiency: Secondary | ICD-10-CM

## 2011-04-26 MED ORDER — CYANOCOBALAMIN 1000 MCG/ML IJ SOLN
1000.0000 ug | Freq: Once | INTRAMUSCULAR | Status: AC
  Administered 2011-04-26: 1000 ug via INTRAMUSCULAR

## 2011-04-26 NOTE — Telephone Encounter (Signed)
Pt states the medication that he was put on for his bowels is causing pt diarrhea.  Pt states he is going to stop the medication but will need something else to take.  Pls advise.

## 2011-04-29 ENCOUNTER — Other Ambulatory Visit: Payer: Self-pay | Admitting: Family Medicine

## 2011-04-29 MED ORDER — LINACLOTIDE 145 MCG PO CAPS: 145.0000 ug | ORAL_CAPSULE | ORAL | Status: DC

## 2011-04-29 NOTE — Telephone Encounter (Signed)
When all loose BMs have stopped. Have him pick up sample of NEW constipation med--Linzess 145 mcg cap, one each morning on an empty stomach. I have set samples aside next to your keyboard.  Have him call back and let us know how he's doing on this after about 7-10d.  Thx

## 2011-04-29 NOTE — Telephone Encounter (Signed)
I have attempted to contact this patient by phone with the following results: left message to return my call on answering machine (home).  

## 2011-04-30 NOTE — Telephone Encounter (Signed)
Talked to pt in lobby: has runny nose, sneezing, mild cough.  No wheezing, no chest tightness, no malaise, no achiness, no fevers. I recommended he continue nasonex, ipratropium nasal spray, and add allegra 60 mg bid. Samples of Linzess were given to pt as well for his constipation issues.--PM

## 2011-04-30 NOTE — Telephone Encounter (Signed)
RC from pt.  He will come pick up new med.  All loose BM's have stopped. Pt now has a cold and wants to know what he can take.  Would like to know if there is a liquid mucinex. Please advise.

## 2011-05-01 ENCOUNTER — Other Ambulatory Visit: Payer: Self-pay | Admitting: Pulmonary Disease

## 2011-05-01 ENCOUNTER — Ambulatory Visit (INDEPENDENT_AMBULATORY_CARE_PROVIDER_SITE_OTHER): Payer: Medicare Other | Admitting: *Deleted

## 2011-05-01 ENCOUNTER — Encounter: Payer: Self-pay | Admitting: Internal Medicine

## 2011-05-01 DIAGNOSIS — I4891 Unspecified atrial fibrillation: Secondary | ICD-10-CM

## 2011-05-01 DIAGNOSIS — Z8679 Personal history of other diseases of the circulatory system: Secondary | ICD-10-CM

## 2011-05-01 DIAGNOSIS — I428 Other cardiomyopathies: Secondary | ICD-10-CM

## 2011-05-01 DIAGNOSIS — Z7901 Long term (current) use of anticoagulants: Secondary | ICD-10-CM

## 2011-05-01 LAB — ICD DEVICE OBSERVATION
BRDY-0002RV: 40 {beats}/min
CHARGE TIME: 8.79 s
DEV-0020ICD: NEGATIVE
TZAT-0001FASTVT: 1
TZAT-0004FASTVT: 8
TZAT-0011SLOWVT: 10 ms
TZAT-0012SLOWVT: 200 ms
TZAT-0013FASTVT: 1
TZAT-0018SLOWVT: NEGATIVE
TZAT-0018SLOWVT: NEGATIVE
TZAT-0019FASTVT: 8 V
TZAT-0019SLOWVT: 8 V
TZAT-0019SLOWVT: 8 V
TZAT-0020FASTVT: 1.6 ms
TZAT-0020SLOWVT: 1.6 ms
TZAT-0020SLOWVT: 1.6 ms
TZON-0005SLOWVT: 12
TZON-0008FASTVT: 0 ms
TZON-0008SLOWVT: 0 ms
TZST-0001FASTVT: 2
TZST-0001FASTVT: 4
TZST-0001FASTVT: 6
TZST-0001SLOWVT: 4
TZST-0001SLOWVT: 5
TZST-0003FASTVT: 35 J
TZST-0003FASTVT: 35 J
TZST-0003FASTVT: 35 J
TZST-0003SLOWVT: 10 J
TZST-0003SLOWVT: 35 J
VENTRICULAR PACING ICD: 1.6 pct

## 2011-05-01 MED ORDER — TIOTROPIUM BROMIDE MONOHYDRATE 18 MCG IN CAPS: 18.0000 ug | ORAL_CAPSULE | Freq: Every day | RESPIRATORY_TRACT | Status: DC

## 2011-05-01 NOTE — Progress Notes (Signed)
icd check in clinic  

## 2011-05-03 ENCOUNTER — Encounter: Payer: Medicare Other | Admitting: *Deleted

## 2011-05-08 NOTE — Assessment & Plan Note (Signed)
Being followed by Dr. Arbie Cookey.  No definite symptoms.  The patient and family have had thorough discussions with Dr. Arbie Cookey regarding risks and benefits of pre-emptive surgery.

## 2011-05-08 NOTE — Progress Notes (Signed)
HPI:  In for follow up.  Cardiac wise relatively stable.  No back and deep thoracic pain.  Being followed by Dr. Arbie Cookey.  Was placed on Savella, and that did not work out to well for him.  Did not tolerate it very well.  No current angina.   Current Outpatient Prescriptions  Medication Sig Dispense Refill  . acetaminophen (TYLENOL) 325 MG tablet Take 325 mg by mouth every 6 (six) hours as needed. For pain      . albuterol (PROVENTIL) (2.5 MG/3ML) 0.083% nebulizer solution Take 2.5 mg by nebulization every 6 (six) hours as needed. For shortness of breath      . amiodarone (PACERONE) 200 MG tablet Take 200 mg by mouth daily.        Marland Kitchen aspirin 81 MG tablet Take 81 mg by mouth daily.        Marland Kitchen BENICAR 20 MG tablet TAKE ONE-HALF TABLET BY MOUTH DAILY  30 tablet  3  . budesonide (PULMICORT) 0.25 MG/2ML nebulizer solution Take 0.25 mg by nebulization 2 (two) times daily.        . carvedilol (COREG) 3.125 MG tablet Take 1 tablet (3.125 mg total) by mouth 2 (two) times daily.  60 tablet  11  . cetirizine (ZYRTEC) 10 MG tablet Take 10 mg by mouth daily.        . clotrimazole-betamethasone (LOTRISONE) cream Apply 1 application topically 2 (two) times daily as needed. For skin irritation      . cyanocobalamin 1000 MCG/ML injection Inject into the muscle every 30 (thirty) days.        Marland Kitchen desoximetasone (TOPICORT) 0.25 % cream Apply 1 application topically 2 (two) times daily as needed. For rash      . esomeprazole (NEXIUM) 40 MG capsule Take 1 capsule (40 mg total) by mouth daily.  30 capsule  11  . finasteride (PROSCAR) 5 MG tablet Take 5 mg by mouth daily.        . furosemide (LASIX) 40 MG tablet Take 40 mg by mouth daily.        Marland Kitchen guaiFENesin (MUCINEX) 600 MG 12 hr tablet Take 1,200 mg by mouth 2 (two) times daily as needed. For congestion      . ipratropium (ATROVENT) 0.03 % nasal spray Two sprays three times a day as needed before meals  30 mL  1  . isosorbide mononitrate (IMDUR) 60 MG 24 hr tablet Take  60 mg by mouth daily.        Marland Kitchen KLOR-CON 10 10 MEQ CR tablet TAKE 1 TABLET BY MOUTH EVERY DAY  30 tablet  6  . levalbuterol (XOPENEX HFA) 45 MCG/ACT inhaler Inhale 1-2 puffs into the lungs every 4 (four) hours as needed. For shortness of breath       . levothyroxine (SYNTHROID, LEVOTHROID) 125 MCG tablet Take 1 mcg by mouth as directed. Alternate 1 tab qd with 1/2 tab qd       . lithium 300 MG capsule Take 300 mg by mouth 2 (two) times daily with a meal.        . Menthol, Topical Analgesic, (ICY HOT BACK) 5 % PADS Place 1 patch onto the skin daily as needed. For pain       . mometasone (NASONEX) 50 MCG/ACT nasal spray Place 2 sprays into the nose 2 (two) times daily as needed. allergies       . nitroGLYCERIN (NITROSTAT) 0.3 MG SL tablet Place 0.3 mg under the tongue every 5 (five)  minutes as needed. For chest pain      . NON FORMULARY 2 Act daily. Flutter Valve.       . NON FORMULARY as directed. Home nebulizer.       . rosuvastatin (CRESTOR) 5 MG tablet Take 5 mg by mouth once a week. "Sundays      . Tamsulosin HCl (FLOMAX) 0.4 MG CAPS Take 0.4 mg by mouth daily.        . warfarin (COUMADIN) 5 MG tablet        . guaiFENesin-dextromethorphan (ROBITUSSIN DM) 100-10 MG/5ML syrup Take 5 mLs by mouth 3 (three) times daily as needed. As needed for cough      . HYDROcodone-acetaminophen (NORCO) 5-325 MG per tablet Take 1-2 tablets by mouth every 6 (six) hours as needed. For pain       . Linaclotide (LINZESS) 145 MCG CAPS Take 145 mcg by mouth every morning. (empty stomach)  20 capsule  0  . sitaGLIPtin (JANUVIA) 100 MG tablet Take 1 tablet (100 mg total) by mouth daily.  30 tablet  3  . tiotropium (SPIRIVA) 18 MCG inhalation capsule Place 1 capsule (18 mcg total) into inhaler and inhale daily.  30 capsule  3    Allergies  Allergen Reactions  . Atorvastatin     REACTION: muscle ache in legs  . Crestor (Rosuvastatin Calcium) Other (See Comments)    Lower ext fatigue and soreness  . Lisinopril  Diarrhea  . Morphine     REACTION: AMS - agitation  . Prednisone     REACTION: Can't breath  . Zolpidem Tartrate     REACTION: AMS    Past Medical History  Diagnosis Date  . CAD (coronary artery disease)     post CABG with prior percutaneous intervention of teh saphenous vein graft with known saphenous vein graft disease.  . AAA (abdominal aortic aneurysm) 2010    5.8cm  . Aneurysm of thoracic aorta 1995    Rupture 1995 w/spontaneous resolution  . Popliteal artery aneurysm   . Myocardial infarction   . Ischemic cardiomyopathy   . Paroxysmal atrial fibrillation   . Hypertension   . Peripheral vascular disease   . Dyslipidemia   . Diabetes mellitus 2007    dx'd 2007 w/persisting FBS>125  . GERD (gastroesophageal reflux disease)   . Hiatal hernia   . Diverticular disease   . BPH (benign prostatic hypertrophy)     with hx of acute urinary retention (pt can self-cath)  . COPD (chronic obstructive pulmonary disease)     12" /28/09 PFT FEV1 1.84 (55%), FVC 4.42 (95%), TLC 6.46 (92%), DLCO 72%, no BD response  . Pneumonia 1/09  . Allergic rhinitis   . Shingles   . OSA (obstructive sleep apnea) 06/14/09    PSG RDI 17, PLMI 96, intolerant of CPAP or BPAP  . Chronic renal insufficiency     Baseline Cr 1.5  . Prostate cancer 07/2008    Low grade; watchful waiting with [name]  . Pulmonary nodule, left 03/02/2010    Left base; resolved on f/u CT  . Allergic rhinitis   . Carpal tunnel syndrome of left wrist 02/11/2011  . IMPLANTATION OF DEFIBRILLATOR, HX OF 09/02/2008  . IRON DEFICIENCY 06/08/2010    Hemoccult negative:  GI ([name]) recommended no invasive diagnostics--watchful waiting (2012).  . UNSPECIFIED ANEMIA 06/01/2010    Multifactorial: iron def, CRI, vit B12 def,     Past Surgical History  Procedure Date  . Cardiac defibrillator placement 08/28/2004  [name]  . Colon surgery 1995    partial colectomy  . Coronary artery bypass graft 1996  . Aorta -  bilateral femoral artery bypass graft 1999    [name] Early  . Nose surgery   . Insertion of pacing lead     New rate sensing pacing lead with removal of a previous implanted ICD and insertion of a device back in the pocket with defibrillation threshold testing  . Abdominal aortic aneurysm repair 1999    Ingrarenal repair/ graft  . Stents     Placed to the saphenous vein graft  . Radiofrequency ablation 2006    A flutter    Family History  Problem Relation Age of Onset  . Stroke Mother 40  . Heart attack Mother     CVA  . Cancer Father 18    Lung  . Heart attack Father 17  . Lung cancer Father   . Coronary artery disease Other     2 of 5 siblings with CAD    History   Social History  . Marital Status: Married    Spouse Name: N/A    Number of Children: N/A  . Years of Education: N/A   Occupational History  . RETIRED     Mechanic   Social History Main Topics  . Smoking status: Former Smoker -- 1.0 packs/day for 30 years    Types: Cigarettes    Quit date: 05/23/2003  . Smokeless tobacco: Former Neurosurgeon    Types: Chew    Quit date: 05/14/2003   Comment: 80 pack a year history  . Alcohol Use: 0.6 oz/week    1 Cans of beer per week     only occ  . Drug Use: No  . Sexually Active: Not on file   Other Topics Concern  . Not on file   Social History Narrative   Lives in [place] with his wife.Very involved in his church.Enjoys bluegrass music, going to [employer], spending time with extended family.Used to play golf twice a week up until 2012.No alc/drug use.Former smoker, quit 2005.    ROS: Please see the HPI.  All other systems reviewed and negative.  PHYSICAL EXAM:  BP 128/71  Pulse 69  Resp 18  Ht 5\' 7"  (1.702 m)  Wt 73.537 kg (162 lb 1.9 oz)  BMI 25.39 kg/m2  General: Well developed, well nourished, in no acute distress. Head:  Normocephalic and atraumatic. Neck: no JVD Lungs: Prolonged expiration, and decrease BS bilaterally.  No rales.  Heart:  Normal S1 and S2.  No murmur, rubs or gallops.  Abdomen:  Normal bowel sounds; soft; non tender; no organomegaly Pulses: Pulses normal in all 4 extremities. Extremities: No clubbing or cyanosis. No edema. Neurologic: Alert and oriented x 3.  EKG:  NSR.  Anteroseptal MI, age indeterminate.  Left axis deviation.   ASSESSMENT AND PLAN:

## 2011-05-08 NOTE — Assessment & Plan Note (Signed)
No current angina.  Both SVG are occluded, fortunately LIMA remains open.  Plan continued medical therapy in the absence of increase symptoms.

## 2011-05-08 NOTE — Assessment & Plan Note (Signed)
Tolerating amiodarone in low dose relatively well. CT of chest, thyroid, and LFTs all look relatively stable.  He does not tolerate being out of rhythm.

## 2011-05-08 NOTE — Assessment & Plan Note (Signed)
Reasonable hemodynamics. On Benicar, furosemide, and amio  (B blocker effect).

## 2011-05-15 ENCOUNTER — Other Ambulatory Visit: Payer: Self-pay | Admitting: *Deleted

## 2011-05-15 MED ORDER — MOMETASONE FUROATE 50 MCG/ACT NA SUSP: 2.0000 | Freq: Two times a day (BID) | NASAL | Status: DC | PRN

## 2011-05-27 ENCOUNTER — Telehealth: Payer: Self-pay | Admitting: Cardiology

## 2011-05-27 NOTE — Telephone Encounter (Signed)
The pt called to see if we had any cardiologist in our group who would see a 76 year old.  I made the pt aware of Dr Meredeth Ide from [facility].  He will give his friend the information.

## 2011-05-27 NOTE — Telephone Encounter (Signed)
New Msg: Pt calling needing to speak to Lauren regarding a friend of pt. Please return pt call to discuss further.

## 2011-05-29 ENCOUNTER — Ambulatory Visit (INDEPENDENT_AMBULATORY_CARE_PROVIDER_SITE_OTHER): Payer: Medicare Other | Admitting: *Deleted

## 2011-05-29 DIAGNOSIS — I4891 Unspecified atrial fibrillation: Secondary | ICD-10-CM

## 2011-05-29 DIAGNOSIS — Z7901 Long term (current) use of anticoagulants: Secondary | ICD-10-CM

## 2011-05-29 LAB — POCT INR: INR: 1.8

## 2011-05-29 LAB — HEMOCCULT GUIAC POC 1CARD (OFFICE)

## 2011-06-06 ENCOUNTER — Encounter: Payer: Self-pay | Admitting: Family Medicine

## 2011-06-06 ENCOUNTER — Ambulatory Visit (INDEPENDENT_AMBULATORY_CARE_PROVIDER_SITE_OTHER): Payer: Medicare Other | Admitting: Family Medicine

## 2011-06-06 ENCOUNTER — Ambulatory Visit (HOSPITAL_BASED_OUTPATIENT_CLINIC_OR_DEPARTMENT_OTHER)
Admission: RE | Admit: 2011-06-06 | Discharge: 2011-06-06 | Disposition: A | Discharge status: Home / Self Care | Payer: Medicare Other | Source: Ambulatory Visit | Attending: Family Medicine | Admitting: Family Medicine

## 2011-06-06 VITALS — BP 120/72 | HR 62 | Temp 98.9°F | Ht 70.5 in | Wt 170.8 lb

## 2011-06-06 DIAGNOSIS — I10 Essential (primary) hypertension: Secondary | ICD-10-CM

## 2011-06-06 DIAGNOSIS — R05 Cough: Secondary | ICD-10-CM

## 2011-06-06 DIAGNOSIS — R0989 Other specified symptoms and signs involving the circulatory and respiratory systems: Secondary | ICD-10-CM

## 2011-06-06 DIAGNOSIS — K5900 Constipation, unspecified: Secondary | ICD-10-CM

## 2011-06-06 DIAGNOSIS — J069 Acute upper respiratory infection, unspecified: Secondary | ICD-10-CM

## 2011-06-06 DIAGNOSIS — J449 Chronic obstructive pulmonary disease, unspecified: Secondary | ICD-10-CM

## 2011-06-06 DIAGNOSIS — J4489 Other specified chronic obstructive pulmonary disease: Secondary | ICD-10-CM | POA: Insufficient documentation

## 2011-06-06 DIAGNOSIS — K5909 Other constipation: Secondary | ICD-10-CM

## 2011-06-06 DIAGNOSIS — E538 Deficiency of other specified B group vitamins: Secondary | ICD-10-CM

## 2011-06-06 MED ORDER — CYANOCOBALAMIN 1000 MCG/ML IJ SOLN
1000.0000 ug | Freq: Once | INTRAMUSCULAR | Status: AC
  Administered 2011-06-06: 1000 ug via INTRAMUSCULAR

## 2011-06-06 MED ORDER — HYDROCODONE-HOMATROPINE 5-1.5 MG/5ML PO SYRP: ORAL_SOLUTION | ORAL | Status: AC

## 2011-06-06 NOTE — Assessment & Plan Note (Signed)
Viral etiology likely.  No sign of COPD flare. I believe his adventitious lung sounds are from scarring +/- atelectasis, but given his mild cough and other comorbidities I want to check a CXR today to r/o infiltrate (or edema). Otherwise, the plan is to continue nasonex qd, saline nasal spray tid prn, and I gave rx for hycodan suspension for him to take 1-2 tsp po qhs prn cough.  He can continue mucinex dm in daytime.

## 2011-06-06 NOTE — Assessment & Plan Note (Signed)
I told him to try taking the linzess 1 cap every 2-3 days for a few weeks to see if it made him more regular but NOT cause diarrhea. I gave him samples of the 290 mg tabs today.

## 2011-06-06 NOTE — Progress Notes (Signed)
OFFICE VISIT  06/06/2011   CC:  Chief Complaint  Patient presents with  . congestion    chest congestion X 2 weeks  . Injections    b12     HPI:    Patient is a 76 y.o. Caucasian male who presents for nasal congestion. Onset 7-10d ago, no significant cough.  No fever.  No ST.  Some gravely voice started today. No SOB or wheezing.  Feels mildly fatigued.  Says colds usually keep him down for 2-3 weeks. He just got over one before this one started up. Appetite is good.  Still having constipation: says linzess qd caused diarrhea after 4-5 doses so he stopped it. Says daily wt fluctuates 5 lb up and down almost day to day (at home).  Today he weighs 11 lb more than last visit 7 wks ago but has heavy boots, jeans, and a light jacket on.  Past Medical History  Diagnosis Date  . CAD (coronary artery disease)     post CABG with prior percutaneous intervention of teh saphenous vein graft with known saphenous vein graft disease.  Marland Kitchen AAA (abdominal aortic aneurysm) 2010    5.8cm  . Aneurysm of thoracic aorta 1995    Rupture 1995 w/spontaneous resolution  . Popliteal artery aneurysm   . Myocardial infarction   . Ischemic cardiomyopathy   . Paroxysmal atrial fibrillation   . Hypertension   . Peripheral vascular disease   . Dyslipidemia   . Diabetes mellitus 2007    dx'd 2007 w/persisting FBS>125  . GERD (gastroesophageal reflux disease)   . Hiatal hernia   . Diverticular disease   . BPH (benign prostatic hypertrophy)     with hx of acute urinary retention (pt can self-cath)  . COPD (chronic obstructive pulmonary disease)     05/09/08 PFT FEV1 1.84 (55%), FVC 4.42 (95%), TLC 6.46 (92%), DLCO 72%, no BD response  . Pneumonia 1/09  . Allergic rhinitis   . Shingles   . OSA (obstructive sleep apnea) 06/14/09    PSG RDI 17, PLMI 96, intolerant of CPAP or BPAP  . Chronic renal insufficiency     Baseline Cr 1.5  . Prostate cancer 07/2008    Low grade; watchful waiting with [name]   . Pulmonary nodule, left 03/02/2010    Left base; resolved on f/u CT  . Allergic rhinitis   . Carpal tunnel syndrome of left wrist 02/11/2011  . IMPLANTATION OF DEFIBRILLATOR, HX OF 09/02/2008  . IRON DEFICIENCY 06/08/2010    Hemoccult negative:  GI ([name]) recommended no invasive diagnostics--watchful waiting (2012).  . UNSPECIFIED ANEMIA 06/01/2010    Multifactorial: iron def, CRI, vit B12 def,     Past Surgical History  Procedure Date  . Cardiac defibrillator placement 08/28/2004    [name]  . Colon surgery 1995    partial colectomy  . Coronary artery bypass graft 1996  . Aorta - bilateral femoral artery bypass graft 1999    [name] Early  . Nose surgery   . Insertion of pacing lead     New rate sensing pacing lead with removal of a previous implanted ICD and insertion of a device back in the pocket with defibrillation threshold testing  . Abdominal aortic aneurysm repair 1999    Ingrarenal repair/ graft  . Stents     Placed to the saphenous vein graft  . Radiofrequency ablation 2006    A flutter    Outpatient Prescriptions Prior to Visit  Medication Sig  Dispense Refill  . acetaminophen (TYLENOL) 325 MG tablet Take 325 mg by mouth every 6 (six) hours as needed. For pain      . albuterol (PROVENTIL) (2.5 MG/3ML) 0.083% nebulizer solution Take 2.5 mg by nebulization every 6 (six) hours as needed. For shortness of breath      . amiodarone (PACERONE) 200 MG tablet Take 200 mg by mouth daily.        Marland Kitchen aspirin 81 MG tablet Take 81 mg by mouth daily.        Marland Kitchen BENICAR 20 MG tablet TAKE ONE-HALF TABLET BY MOUTH DAILY  30 tablet  3  . budesonide (PULMICORT) 0.25 MG/2ML nebulizer solution Take 0.25 mg by nebulization 2 (two) times daily.        . carvedilol (COREG) 3.125 MG tablet Take 1 tablet (3.125 mg total) by mouth 2 (two) times daily.  60 tablet  11  . cetirizine (ZYRTEC) 10 MG tablet Take 10 mg by mouth daily.        . clotrimazole-betamethasone (LOTRISONE)  cream Apply 1 application topically 2 (two) times daily as needed. For skin irritation      . cyanocobalamin 1000 MCG/ML injection Inject into the muscle every 30 (thirty) days.        Marland Kitchen desoximetasone (TOPICORT) 0.25 % cream Apply 1 application topically 2 (two) times daily as needed. For rash      . esomeprazole (NEXIUM) 40 MG capsule Take 1 capsule (40 mg total) by mouth daily.  30 capsule  11  . finasteride (PROSCAR) 5 MG tablet Take 5 mg by mouth daily.        . furosemide (LASIX) 40 MG tablet Take 40 mg by mouth daily.        Marland Kitchen guaiFENesin (MUCINEX) 600 MG 12 hr tablet Take 1,200 mg by mouth 2 (two) times daily as needed. For congestion      . guaiFENesin-dextromethorphan (ROBITUSSIN DM) 100-10 MG/5ML syrup Take 5 mLs by mouth 3 (three) times daily as needed. As needed for cough      . HYDROcodone-acetaminophen (NORCO) 5-325 MG per tablet Take 1-2 tablets by mouth every 6 (six) hours as needed. For pain       . ipratropium (ATROVENT) 0.03 % nasal spray Two sprays three times a day as needed before meals  30 mL  1  . isosorbide mononitrate (IMDUR) 60 MG 24 hr tablet Take 60 mg by mouth daily.        Marland Kitchen KLOR-CON 10 10 MEQ CR tablet TAKE 1 TABLET BY MOUTH EVERY DAY  30 tablet  6  . levalbuterol (XOPENEX HFA) 45 MCG/ACT inhaler Inhale 1-2 puffs into the lungs every 4 (four) hours as needed. For shortness of breath       . levothyroxine (SYNTHROID, LEVOTHROID) 125 MCG tablet Take 1 mcg by mouth as directed. Alternate 1 tab qd with 1/2 tab qd       . Linaclotide (LINZESS) 145 MCG CAPS Take 145 mcg by mouth every morning. (empty stomach)  20 capsule  0  . lithium 300 MG capsule Take 300 mg by mouth 2 (two) times daily with a meal.        . Menthol, Topical Analgesic, (ICY HOT BACK) 5 % PADS Place 1 patch onto the skin daily as needed. For pain       . mometasone (NASONEX) 50 MCG/ACT nasal spray Place 2 sprays into the nose 2 (two) times daily as needed. allergies  17 g  2  .  nitroGLYCERIN (NITROSTAT)  0.3 MG SL tablet Place 0.3 mg under the tongue every 5 (five) minutes as needed. For chest pain      . NON FORMULARY 2 Act daily. Flutter Valve.       . NON FORMULARY as directed. Home nebulizer.       . rosuvastatin (CRESTOR) 5 MG tablet Take 5 mg by mouth once a week. Sundays      . sitaGLIPtin (JANUVIA) 100 MG tablet Take 1 tablet (100 mg total) by mouth daily.  30 tablet  3  . Tamsulosin HCl (FLOMAX) 0.4 MG CAPS Take 0.4 mg by mouth daily.        Marland Kitchen tiotropium (SPIRIVA) 18 MCG inhalation capsule Place 1 capsule (18 mcg total) into inhaler and inhale daily.  30 capsule  3  . warfarin (COUMADIN) 5 MG tablet         No facility-administered medications prior to visit.    Allergies  Allergen Reactions  . Atorvastatin     REACTION: muscle ache in legs  . Crestor (Rosuvastatin Calcium) Other (See Comments)    Lower ext fatigue and soreness  . Lisinopril Diarrhea  . Morphine     REACTION: AMS - agitation  . Prednisone     REACTION: Can't breath  . Zolpidem Tartrate     REACTION: AMS    ROS As per HPI  PE: Blood pressure 120/72, pulse 62, temperature 98.9 F (37.2 C), temperature source Temporal, height 5' 10.5" (1.791 m), weight 170 lb 12.8 oz (77.474 kg), SpO2 96.00%. VS: noted--normal. Gen: alert, NAD, NONTOXIC APPEARING. HEENT: eyes without injection, drainage, or swelling.  Ears: EACs clear, TMs with normal light reflex and landmarks.  Nose: Clear rhinorrhea, with some dried, crusty exudate adherent to mildly injected mucosa.  No purulent d/c.  No paranasal sinus TTP.  No facial swelling.  Throat and mouth without focal lesion.  No pharyngial swelling, erythema, or exudate.   Neck: supple, no LAD.   LUNGS: Nonlabored.  Good aeration.  No wheezing or prolongation of exp phase. Soft early inspiratory crackles in both bases and axillae.   CV: RRR, no m/r/g. EXT: no c/c/e SKIN: no rash  LABS:  None today  IMPRESSION AND PLAN:  URI (upper respiratory infection) Viral  etiology likely.  No sign of COPD flare. I believe his adventitious lung sounds are from scarring +/- atelectasis, but given his mild cough and other comorbidities I want to check a CXR today to r/o infiltrate (or edema). Otherwise, the plan is to continue nasonex qd, saline nasal spray tid prn, and I gave rx for hycodan suspension for him to take 1-2 tsp po qhs prn cough.  He can continue mucinex dm in daytime.  Constipation, chronic I told him to try taking the linzess 1 cap every 2-3 days for a few weeks to see if it made him more regular but NOT cause diarrhea. I gave him samples of the 290 mg tabs today.  COPD I see no sign of exacerbation. Continue chronic meds for this.  B12 deficiency He's due for his injection so this was given IM today.     FOLLOW UP: Return if symptoms worsen or fail to improve.

## 2011-06-06 NOTE — Assessment & Plan Note (Signed)
He's due for his injection so this was given IM today.

## 2011-06-06 NOTE — Assessment & Plan Note (Signed)
I see no sign of exacerbation. Continue chronic meds for this.

## 2011-06-06 NOTE — Progress Notes (Signed)
Quick Note:  Notified pt of CXR result: stable, without acute findings suggestive of infection or edema.--PM ______

## 2011-06-18 ENCOUNTER — Ambulatory Visit (INDEPENDENT_AMBULATORY_CARE_PROVIDER_SITE_OTHER): Payer: Medicare Other | Admitting: Family Medicine

## 2011-06-18 ENCOUNTER — Encounter: Payer: Self-pay | Admitting: Family Medicine

## 2011-06-18 DIAGNOSIS — K5900 Constipation, unspecified: Secondary | ICD-10-CM

## 2011-06-18 DIAGNOSIS — K5909 Other constipation: Secondary | ICD-10-CM

## 2011-06-18 DIAGNOSIS — J449 Chronic obstructive pulmonary disease, unspecified: Secondary | ICD-10-CM

## 2011-06-18 DIAGNOSIS — I10 Essential (primary) hypertension: Secondary | ICD-10-CM

## 2011-06-18 DIAGNOSIS — G4700 Insomnia, unspecified: Secondary | ICD-10-CM

## 2011-06-18 MED ORDER — HYDROXYZINE HCL 10 MG PO TABS: ORAL_TABLET | ORAL | Status: DC

## 2011-06-18 MED ORDER — ZOSTER VACCINE LIVE 19400 UNT/0.65ML ~~LOC~~ SOLR: 0.6500 mL | Freq: Once | SUBCUTANEOUS | Status: AC

## 2011-06-18 NOTE — Assessment & Plan Note (Signed)
Sleep maintenance is his problem. We are limited regarding medication treatment for him given his comorbidities, potential for med interactions, and past response to sleep aids. Will do trial of low dose hydroxyzine, 10mg  prn when he wakes up in middle of night and cannot fall back asleep. Therapeutic expectations and side effect profile of medication discussed today.  Patient's questions answered.

## 2011-06-18 NOTE — Progress Notes (Signed)
OFFICE VISIT  06/18/2011   CC:  Chief Complaint  Patient presents with  . Follow-up     HPI:    Patient is a 76 y.o. Caucasian male who presents for f/u constipation, insomnia, HTN, and COPD. Recent URI resolved: feels like he's back to his baseline. Constipation better since spacing out linzess dosing to a few times per week.  No diarrhea. Still problems with waking up around 3Am most nights and sometimes takes hours to get back to sleep. No RLS.  No pain inhibiting sleep.    Compliant with all meds. Denies chest pain.    Past Medical History  Diagnosis Date  . CAD (coronary artery disease)     post CABG with prior percutaneous intervention of teh saphenous vein graft with known saphenous vein graft disease.  Marland Kitchen AAA (abdominal aortic aneurysm) 2010    5.8cm  . Aneurysm of thoracic aorta 1995    Rupture 1995 w/spontaneous resolution  . Popliteal artery aneurysm   . Myocardial infarction   . Ischemic cardiomyopathy   . Paroxysmal atrial fibrillation   . Hypertension   . Peripheral vascular disease   . Dyslipidemia   . Diabetes mellitus 2007    dx'd 2007 w/persisting FBS>125  . GERD (gastroesophageal reflux disease)   . Hiatal hernia   . Diverticular disease   . BPH (benign prostatic hypertrophy)     with hx of acute urinary retention (pt can self-cath)  . COPD (chronic obstructive pulmonary disease)     05/09/08 PFT FEV1 1.84 (55%), FVC 4.42 (95%), TLC 6.46 (92%), DLCO 72%, no BD response  . Pneumonia 1/09  . Allergic rhinitis   . Shingles   . OSA (obstructive sleep apnea) 06/14/09    PSG RDI 17, PLMI 96, intolerant of CPAP or BPAP  . Chronic renal insufficiency     Baseline Cr 1.5  . Prostate cancer 07/2008    Low grade; watchful waiting with [name]  . Pulmonary nodule, left 03/02/2010    Left base; resolved on f/u CT  . Allergic rhinitis   . Carpal tunnel syndrome of left wrist 02/11/2011  . IMPLANTATION OF DEFIBRILLATOR, HX OF 09/02/2008  . IRON  DEFICIENCY 06/08/2010    Hemoccult negative:  GI ([name]) recommended no invasive diagnostics--watchful waiting (2012).  . UNSPECIFIED ANEMIA 06/01/2010    Multifactorial: iron def, CRI, vit B12 def,     Past Surgical History  Procedure Date  . Cardiac defibrillator placement 08/28/2004    [name]  . Colon surgery 1995    partial colectomy  . Coronary artery bypass graft 1996  . Aorta - bilateral femoral artery bypass graft 1999    [name] Early  . Nose surgery   . Insertion of pacing lead     New rate sensing pacing lead with removal of a previous implanted ICD and insertion of a device back in the pocket with defibrillation threshold testing  . Abdominal aortic aneurysm repair 1999    Ingrarenal repair/ graft  . Stents     Placed to the saphenous vein graft  . Radiofrequency ablation 2006    A flutter    Outpatient Prescriptions Prior to Visit  Medication Sig Dispense Refill  . acetaminophen (TYLENOL) 325 MG tablet Take 325 mg by mouth every 6 (six) hours as needed. For pain      . albuterol (PROVENTIL) (2.5 MG/3ML) 0.083% nebulizer solution Take 2.5 mg by nebulization every 6 (six) hours as needed. For shortness of breath      .  amiodarone (PACERONE) 200 MG tablet Take 200 mg by mouth daily.        Marland Kitchen aspirin 81 MG tablet Take 81 mg by mouth daily.        Marland Kitchen BENICAR 20 MG tablet TAKE ONE-HALF TABLET BY MOUTH DAILY  30 tablet  3  . budesonide (PULMICORT) 0.25 MG/2ML nebulizer solution Take 0.25 mg by nebulization 2 (two) times daily.        . carvedilol (COREG) 3.125 MG tablet Take 1 tablet (3.125 mg total) by mouth 2 (two) times daily.  60 tablet  11  . cetirizine (ZYRTEC) 10 MG tablet Take 10 mg by mouth daily.        . clotrimazole-betamethasone (LOTRISONE) cream Apply 1 application topically 2 (two) times daily as needed. For skin irritation      . cyanocobalamin 1000 MCG/ML injection Inject into the muscle every 30 (thirty) days.        Marland Kitchen desoximetasone  (TOPICORT) 0.25 % cream Apply 1 application topically 2 (two) times daily as needed. For rash      . esomeprazole (NEXIUM) 40 MG capsule Take 1 capsule (40 mg total) by mouth daily.  30 capsule  11  . finasteride (PROSCAR) 5 MG tablet Take 5 mg by mouth daily.        . furosemide (LASIX) 40 MG tablet Take 40 mg by mouth daily.        Marland Kitchen guaiFENesin (MUCINEX) 600 MG 12 hr tablet Take 1,200 mg by mouth 2 (two) times daily as needed. For congestion      . guaiFENesin-dextromethorphan (ROBITUSSIN DM) 100-10 MG/5ML syrup Take 5 mLs by mouth 3 (three) times daily as needed. As needed for cough      . HYDROcodone-acetaminophen (NORCO) 5-325 MG per tablet Take 1-2 tablets by mouth every 6 (six) hours as needed. For pain       . ipratropium (ATROVENT) 0.03 % nasal spray Two sprays three times a day as needed before meals  30 mL  1  . isosorbide mononitrate (IMDUR) 60 MG 24 hr tablet Take 60 mg by mouth daily.        Marland Kitchen KLOR-CON 10 10 MEQ CR tablet TAKE 1 TABLET BY MOUTH EVERY DAY  30 tablet  6  . levalbuterol (XOPENEX HFA) 45 MCG/ACT inhaler Inhale 1-2 puffs into the lungs every 4 (four) hours as needed. For shortness of breath       . levothyroxine (SYNTHROID, LEVOTHROID) 125 MCG tablet Take 1 mcg by mouth as directed. Alternate 1 tab qd with 1/2 tab qd       . Linaclotide (LINZESS) 145 MCG CAPS Take 145 mcg by mouth every morning. (empty stomach)  20 capsule  0  . lithium 300 MG capsule Take 300 mg by mouth 2 (two) times daily with a meal.        . Menthol, Topical Analgesic, (ICY HOT BACK) 5 % PADS Place 1 patch onto the skin daily as needed. For pain       . mometasone (NASONEX) 50 MCG/ACT nasal spray Place 2 sprays into the nose 2 (two) times daily as needed. allergies  17 g  2  . nitroGLYCERIN (NITROSTAT) 0.3 MG SL tablet Place 0.3 mg under the tongue every 5 (five) minutes as needed. For chest pain      . NON FORMULARY 2 Act daily. Flutter Valve.       . NON FORMULARY as directed. Home nebulizer.         Marland Kitchen  rosuvastatin (CRESTOR) 5 MG tablet Take 5 mg by mouth once a week. Sundays      . sitaGLIPtin (JANUVIA) 100 MG tablet Take 1 tablet (100 mg total) by mouth daily.  30 tablet  3  . Tamsulosin HCl (FLOMAX) 0.4 MG CAPS Take 0.4 mg by mouth daily.        Marland Kitchen tiotropium (SPIRIVA) 18 MCG inhalation capsule Place 1 capsule (18 mcg total) into inhaler and inhale daily.  30 capsule  3  . warfarin (COUMADIN) 5 MG tablet          Allergies  Allergen Reactions  . Atorvastatin     REACTION: muscle ache in legs  . Crestor (Rosuvastatin Calcium) Other (See Comments)    Lower ext fatigue and soreness  . Lisinopril Diarrhea  . Morphine     REACTION: AMS - agitation  . Prednisone     REACTION: Can't breath  . Zolpidem Tartrate     REACTION: AMS    ROS As per HPI  PE: Blood pressure 125/71, pulse 52, temperature 98.6 F (37 C), temperature source Temporal, height 5' 10.5" (1.791 m), weight 166 lb (75.297 kg), SpO2 95.00%. Gen: Alert, well appearing.  Patient is oriented to person, place, time, and situation. CV: RRR, no m/r/g.   LUNGS: CTA bilat, nonlabored resps, good aeration in all lung fields. EXT: no clubbing, cyanosis, or edema.   LABS:  None today  IMPRESSION AND PLAN:  COPD Stable.  Recent URI resolved without triggering flare up of his lung dz. Continue all current meds.  Insomnia Sleep maintenance is his problem. We are limited regarding medication treatment for him given his comorbidities, potential for med interactions, and past response to sleep aids. Will do trial of low dose hydroxyzine, 10mg  prn when he wakes up in middle of night and cannot fall back asleep. Therapeutic expectations and side effect profile of medication discussed today.  Patient's questions answered.   Constipation, chronic Improving some.  Continue linzess 1-3 days per week.  HYPERTENSION Problem stable.  Continue current medications and diet appropriate for this condition.  We have reviewed our  general long term plan for this problem and also reviewed symptoms and signs that should prompt the patient to call or return to the office.    Zostavax rx given today.  FOLLOW UP: Return in about 2 months (around 08/16/2011) for chronic illness f/u.

## 2011-06-18 NOTE — Assessment & Plan Note (Signed)
Improving some.  Continue linzess 1-3 days per week.

## 2011-06-18 NOTE — Assessment & Plan Note (Signed)
Stable.  Recent URI resolved without triggering flare up of his lung dz. Continue all current meds.

## 2011-06-18 NOTE — Assessment & Plan Note (Signed)
Problem stable.  Continue current medications and diet appropriate for this condition.  We have reviewed our general long term plan for this problem and also reviewed symptoms and signs that should prompt the patient to call or return to the office.  

## 2011-06-19 ENCOUNTER — Ambulatory Visit (INDEPENDENT_AMBULATORY_CARE_PROVIDER_SITE_OTHER): Payer: Medicare Other | Admitting: Pharmacist

## 2011-06-19 DIAGNOSIS — Z7901 Long term (current) use of anticoagulants: Secondary | ICD-10-CM

## 2011-06-19 DIAGNOSIS — I4891 Unspecified atrial fibrillation: Secondary | ICD-10-CM

## 2011-06-19 LAB — POCT INR: INR: 2.6

## 2011-07-17 ENCOUNTER — Ambulatory Visit: Payer: Medicare Other | Admitting: Cardiology

## 2011-07-17 ENCOUNTER — Ambulatory Visit (INDEPENDENT_AMBULATORY_CARE_PROVIDER_SITE_OTHER): Payer: Medicare Other | Admitting: *Deleted

## 2011-07-17 DIAGNOSIS — Z7901 Long term (current) use of anticoagulants: Secondary | ICD-10-CM

## 2011-07-17 DIAGNOSIS — I4891 Unspecified atrial fibrillation: Secondary | ICD-10-CM

## 2011-07-17 LAB — POCT INR: INR: 3.2

## 2011-07-22 ENCOUNTER — Other Ambulatory Visit: Payer: Self-pay | Admitting: *Deleted

## 2011-07-22 DIAGNOSIS — I712 Thoracic aortic aneurysm, without rupture: Secondary | ICD-10-CM

## 2011-07-24 ENCOUNTER — Other Ambulatory Visit: Payer: Self-pay | Admitting: Cardiology

## 2011-07-24 ENCOUNTER — Other Ambulatory Visit: Payer: Self-pay

## 2011-07-24 DIAGNOSIS — I428 Other cardiomyopathies: Secondary | ICD-10-CM

## 2011-07-24 MED ORDER — FUROSEMIDE 40 MG PO TABS: 40.0000 mg | ORAL_TABLET | Freq: Every day | ORAL | Status: DC

## 2011-08-06 ENCOUNTER — Ambulatory Visit (INDEPENDENT_AMBULATORY_CARE_PROVIDER_SITE_OTHER): Payer: Medicare Other | Admitting: *Deleted

## 2011-08-06 ENCOUNTER — Ambulatory Visit (INDEPENDENT_AMBULATORY_CARE_PROVIDER_SITE_OTHER): Payer: Medicare Other | Admitting: Cardiology

## 2011-08-06 ENCOUNTER — Encounter: Payer: Self-pay | Admitting: Cardiology

## 2011-08-06 VITALS — BP 130/80 | HR 56 | Ht 71.5 in | Wt 166.0 lb

## 2011-08-06 DIAGNOSIS — I714 Abdominal aortic aneurysm, without rupture: Secondary | ICD-10-CM

## 2011-08-06 DIAGNOSIS — Z7901 Long term (current) use of anticoagulants: Secondary | ICD-10-CM

## 2011-08-06 DIAGNOSIS — I2589 Other forms of chronic ischemic heart disease: Secondary | ICD-10-CM

## 2011-08-06 DIAGNOSIS — N182 Chronic kidney disease, stage 2 (mild): Secondary | ICD-10-CM

## 2011-08-06 DIAGNOSIS — I4891 Unspecified atrial fibrillation: Secondary | ICD-10-CM

## 2011-08-06 DIAGNOSIS — I2510 Atherosclerotic heart disease of native coronary artery without angina pectoris: Secondary | ICD-10-CM

## 2011-08-06 LAB — POCT INR: INR: 2.6

## 2011-08-06 NOTE — Patient Instructions (Signed)
Your physician wants you to follow-up in: 3 MONTHS.  You will receive a reminder letter in the mail two months in advance. If you don't receive a letter, please call our office to schedule the follow-up appointment.  Your physician recommends that you have lab work today: [facility]  Your physician recommends that you continue on your current medications as directed. Please refer to the Current Medication list given to you today.

## 2011-08-07 ENCOUNTER — Telehealth: Payer: Self-pay

## 2011-08-07 LAB — BASIC METABOLIC PANEL
Chloride: 103 mEq/L (ref 96–112)
Creatinine, Ser: 1.3 mg/dL (ref 0.4–1.5)

## 2011-08-07 MED ORDER — BUDESONIDE 0.25 MG/2ML IN SUSP: 0.2500 mg | Freq: Two times a day (BID) | RESPIRATORY_TRACT | Status: DC

## 2011-08-07 NOTE — Telephone Encounter (Signed)
I spoke with pt and he stated he needed his budesonide refill sent to [facility]. I advised pt will print off rx and fax over to [facility] once VS has signed this. He voiced his understanding and had no questions. Will sign off message

## 2011-08-08 ENCOUNTER — Ambulatory Visit (INDEPENDENT_AMBULATORY_CARE_PROVIDER_SITE_OTHER): Payer: Medicare Other | Admitting: Family Medicine

## 2011-08-08 ENCOUNTER — Encounter: Payer: Self-pay | Admitting: Family Medicine

## 2011-08-08 VITALS — BP 105/61 | HR 62 | Temp 98.4°F | Ht 70.5 in | Wt 163.0 lb

## 2011-08-08 DIAGNOSIS — J019 Acute sinusitis, unspecified: Secondary | ICD-10-CM | POA: Insufficient documentation

## 2011-08-08 MED ORDER — AMOXICILLIN-POT CLAVULANATE 500-125 MG PO TABS: ORAL_TABLET | ORAL | Status: DC

## 2011-08-08 MED ORDER — BUDESONIDE 32 MCG/ACT NA SUSP: 2.0000 | Freq: Every day | NASAL | Status: DC

## 2011-08-08 MED ORDER — PREDNISONE 10 MG PO TABS: ORAL_TABLET | ORAL | Status: DC

## 2011-08-08 NOTE — Assessment & Plan Note (Signed)
Symptoms more suggestive of allergic>>infectious. Will change his nasonex to rhinocort aqua 2 sprays each nostril each morning.  Continue nasal saline spray tid. Add augmentin 500mg  bid x 10d. Also, add prednisone 10mg  qd x7d, then 5mg  qd x 8d. Return in 2 wks.

## 2011-08-08 NOTE — Progress Notes (Signed)
OFFICE NOTE  08/08/2011  CC:  Chief Complaint  Patient presents with  . Sinusitis    x 1 month     HPI: Patient is a 76 y.o. Caucasian male who is here for nasal congestion. Pt presents complaining of respiratory symptoms for >21 days.  Mostly nasal congestion/runny nose, sneezing, and rare PND cough.  Worst symptoms seems to be the inability to breath through his nose.  Lately the symptoms seem to be staying the same. No fevers, no wheezing, and no SOB.  No pain in face or teeth.  No significant HA.    Symptoms made worse by nothing.  Symptoms improved by saline nasal spray for 5 min only. Smoker? no Recent sick contact? no Muscle or joint aches? no Flu shot this season at least 2 wks ago? yes  ROS: no n/v/d or abdominal pain.  No rash.  No neck stiffness.   No fatigue or appetite loss.  Pertinent PMH:  Past Medical History  Diagnosis Date  . CAD (coronary artery disease)     post CABG with prior percutaneous intervention of teh saphenous vein graft with known saphenous vein graft disease.  Marland Kitchen AAA (abdominal aortic aneurysm) 2010    5.8cm  . Aneurysm of thoracic aorta 1995    Rupture 1995 w/spontaneous resolution  . Popliteal artery aneurysm   . Myocardial infarction   . Ischemic cardiomyopathy   . Paroxysmal atrial fibrillation   . Hypertension   . Peripheral vascular disease   . Dyslipidemia   . Diabetes mellitus 2007    dx'd 2007 w/persisting FBS>125  . GERD (gastroesophageal reflux disease)   . Hiatal hernia   . Diverticular disease   . BPH (benign prostatic hypertrophy)     with hx of acute urinary retention (pt can self-cath)  . COPD (chronic obstructive pulmonary disease)     05/09/08 PFT FEV1 1.84 (55%), FVC 4.42 (95%), TLC 6.46 (92%), DLCO 72%, no BD response  . Pneumonia 1/09  . Allergic rhinitis   . Shingles   . OSA (obstructive sleep apnea) 06/14/09    PSG RDI 17, PLMI 96, intolerant of CPAP or BPAP  . Chronic renal insufficiency     Baseline Cr 1.5    . Prostate cancer 07/2008    Low grade; watchful waiting with [name]  . Pulmonary nodule, left 03/02/2010    Left base; resolved on f/u CT  . Allergic rhinitis   . Carpal tunnel syndrome of left wrist 02/11/2011  . IMPLANTATION OF DEFIBRILLATOR, HX OF 09/02/2008  . IRON DEFICIENCY 06/08/2010    Hemoccult negative:  GI ([name]) recommended no invasive diagnostics--watchful waiting (2012).  . UNSPECIFIED ANEMIA 06/01/2010    Multifactorial: iron def, CRI, vit B12 def,    Past surgical, social, and family history reviewed and no changes noted since last office visit.  MEDS:  Outpatient Prescriptions Prior to Visit  Medication Sig Dispense Refill  . acetaminophen (TYLENOL) 325 MG tablet Take 325 mg by mouth every 6 (six) hours as needed. For pain      . albuterol (PROVENTIL) (2.5 MG/3ML) 0.083% nebulizer solution Take 2.5 mg by nebulization every 6 (six) hours as needed. For shortness of breath      . amiodarone (PACERONE) 200 MG tablet Take 200 mg by mouth daily.        Marland Kitchen aspirin 81 MG tablet Take 81 mg by mouth daily.        Marland Kitchen BENICAR 20 MG tablet TAKE ONE-HALF TABLET BY MOUTH DAILY  30 tablet  3  . budesonide (PULMICORT) 0.25 MG/2ML nebulizer solution Take 2 mLs (0.25 mg total) by nebulization 2 (two) times daily.  60 mL  5  . carvedilol (COREG) 3.125 MG tablet Take 1 tablet (3.125 mg total) by mouth 2 (two) times daily.  60 tablet  11  . cetirizine (ZYRTEC) 10 MG tablet Take 10 mg by mouth daily.        . clotrimazole-betamethasone (LOTRISONE) cream Apply 1 application topically 2 (two) times daily as needed. For skin irritation      . cyanocobalamin 1000 MCG/ML injection Inject into the muscle every 30 (thirty) days.        Marland Kitchen desoximetasone (TOPICORT) 0.25 % cream Apply 1 application topically 2 (two) times daily as needed. For rash      . finasteride (PROSCAR) 5 MG tablet Take 5 mg by mouth daily.        . furosemide (LASIX) 40 MG tablet Take 1 tablet (40 mg total) by mouth  daily.  30 tablet  3  . guaiFENesin (MUCINEX) 600 MG 12 hr tablet Take 1,200 mg by mouth 2 (two) times daily as needed. For congestion      . guaiFENesin-dextromethorphan (ROBITUSSIN DM) 100-10 MG/5ML syrup Take 5 mLs by mouth 3 (three) times daily as needed. As needed for cough      . hydrOXYzine (ATARAX/VISTARIL) 10 MG tablet 1 tab po qhs prn insomnia  30 tablet  3  . ipratropium (ATROVENT) 0.03 % nasal spray Two sprays three times a day as needed before meals  30 mL  1  . isosorbide mononitrate (IMDUR) 60 MG 24 hr tablet Take 60 mg by mouth daily.        Marland Kitchen KLOR-CON 10 10 MEQ CR tablet TAKE 1 TABLET BY MOUTH EVERY DAY  30 tablet  6  . levothyroxine (SYNTHROID, LEVOTHROID) 125 MCG tablet Take 1 mcg by mouth as directed. Alternate 1 tab qd with 1/2 tab qd       . Linaclotide (LINZESS) 145 MCG CAPS Take 145 mcg by mouth every morning. (empty stomach)  20 capsule  0  . lithium 300 MG capsule Take 300 mg by mouth 2 (two) times daily with a meal.        . mometasone (NASONEX) 50 MCG/ACT nasal spray Place 2 sprays into the nose 2 (two) times daily as needed. allergies  17 g  2  . nitroGLYCERIN (NITROSTAT) 0.3 MG SL tablet Place 0.3 mg under the tongue every 5 (five) minutes as needed. For chest pain      . NON FORMULARY 2 Act daily. Flutter Valve.       . NON FORMULARY as directed. Home nebulizer.       . rosuvastatin (CRESTOR) 5 MG tablet Take 5 mg by mouth once a week. Sundays      . sitaGLIPtin (JANUVIA) 100 MG tablet Take 1 tablet (100 mg total) by mouth daily.  30 tablet  3  . Tamsulosin HCl (FLOMAX) 0.4 MG CAPS Take 0.4 mg by mouth daily.        Marland Kitchen tiotropium (SPIRIVA) 18 MCG inhalation capsule Place 1 capsule (18 mcg total) into inhaler and inhale daily.  30 capsule  3  . warfarin (COUMADIN) 5 MG tablet TAKE AS DIRECTED PER COUMADIN CLINIC  30 tablet  3  . esomeprazole (NEXIUM) 40 MG capsule Take 1 capsule (40 mg total) by mouth daily.  30 capsule  11  . levalbuterol (XOPENEX HFA) 45 MCG/ACT  inhaler  Inhale 1-2 puffs into the lungs every 6 (six) hours as needed.  1 Inhaler  6  . levalbuterol (XOPENEX HFA) 45 MCG/ACT inhaler Inhale 1-2 puffs into the lungs every 4 (four) hours as needed. For shortness of breath       . tiotropium (SPIRIVA HANDIHALER) 18 MCG inhalation capsule Place 1 capsule (18 mcg total) into inhaler and inhale daily.  30 capsule  3    PE: Blood pressure 105/61, pulse 62, temperature 98.4 F (36.9 C), height 5' 10.5" (1.791 m), weight 163 lb (73.936 kg). Gen: Alert, well appearing.  Patient is oriented to person, place, time, and situation. ENT: Ears: EACs clear, normal epithelium.  TMs with good light reflex and landmarks bilaterally.  Eyes: no injection, icteris, swelling, or exudate.  EOMI, PERRLA. Nose: diffusely edematous and boggy/greyish mucosa.  No purulent d/c noted.  No injection or focal lesion.  Mouth: lips without lesion/swelling.  Oral mucosa pink and moist.  Dentition intact and without obvious caries or gingival swelling.  Oropharynx without erythema, exudate, or swelling.  CV: RRR LUNGS: aeration is good, breathing is calm.  He has a trace of end exp wheezing diffusely.  Exp phase not prolonged.   IMPRESSION AND PLAN:  Acute rhinosinusitis Symptoms more suggestive of allergic>>infectious. Will change his nasonex to rhinocort aqua 2 sprays each nostril each morning.  Continue nasal saline spray tid. Add augmentin 500mg  bid x 10d. Also, add prednisone 10mg  qd x7d, then 5mg  qd x 8d. Return in 2 wks.      FOLLOW UP: 2 wks--do allergy testing if not improved.

## 2011-08-12 ENCOUNTER — Other Ambulatory Visit: Payer: Self-pay | Admitting: Cardiovascular Disease

## 2011-08-13 ENCOUNTER — Other Ambulatory Visit: Payer: Self-pay

## 2011-08-13 MED ORDER — ISOSORBIDE MONONITRATE ER 60 MG PO TB24: 60.0000 mg | ORAL_TABLET | ORAL | Status: DC

## 2011-08-13 NOTE — Telephone Encounter (Signed)
..   Requested Prescriptions   Signed Prescriptions Disp Refills  . isosorbide mononitrate (IMDUR) 60 MG 24 hr tablet 30 tablet 11    Sig: Take 1 tablet (60 mg total) by mouth every morning.    Authorizing Provider: [name] D    Ordering User: Christella Hartigan, Mirjana Tarleton Judie Petit

## 2011-08-16 ENCOUNTER — Ambulatory Visit (INDEPENDENT_AMBULATORY_CARE_PROVIDER_SITE_OTHER): Payer: Medicare Other | Admitting: Family Medicine

## 2011-08-16 ENCOUNTER — Encounter: Payer: Self-pay | Admitting: Family Medicine

## 2011-08-16 VITALS — BP 115/66 | HR 61 | Ht 70.5 in | Wt 167.0 lb

## 2011-08-16 DIAGNOSIS — J310 Chronic rhinitis: Secondary | ICD-10-CM

## 2011-08-16 DIAGNOSIS — K5900 Constipation, unspecified: Secondary | ICD-10-CM

## 2011-08-16 DIAGNOSIS — E538 Deficiency of other specified B group vitamins: Secondary | ICD-10-CM

## 2011-08-16 DIAGNOSIS — I2589 Other forms of chronic ischemic heart disease: Secondary | ICD-10-CM

## 2011-08-16 DIAGNOSIS — E119 Type 2 diabetes mellitus without complications: Secondary | ICD-10-CM

## 2011-08-16 DIAGNOSIS — M545 Low back pain: Secondary | ICD-10-CM

## 2011-08-16 DIAGNOSIS — I712 Thoracic aortic aneurysm, without rupture: Secondary | ICD-10-CM

## 2011-08-16 DIAGNOSIS — J449 Chronic obstructive pulmonary disease, unspecified: Secondary | ICD-10-CM

## 2011-08-16 DIAGNOSIS — K5909 Other constipation: Secondary | ICD-10-CM

## 2011-08-16 MED ORDER — FLUNISOLIDE 25 MCG/ACT (0.025%) NA SOLN: 2.0000 | Freq: Two times a day (BID) | NASAL | Status: DC

## 2011-08-16 NOTE — Progress Notes (Addendum)
OFFICE VISIT  08/18/2011   CC:  Chief Complaint  Patient presents with  . Follow-up    multiple issues     HPI:    Patient is a 76 y.o. Caucasian male who presents for routine chronic illness f/u. His recent URI/sinus sx's are much improved.  Tolerated prednisone well. Constipation much improved on linzess: soft bm q2-3d--taking only intermittently now.  Reports onset of right lower back/flank pain about 3AM today, sharp at first, then turned dull.  Took tylenol, applied aspercream.  No tingling of skin lately, no rash.  No nausea, no fever, no radiation of the pain, no dizziness.  Eating fine.  Says the pain is quite a bit better already.  Denies dysuria, hematuria.  He has chronic urinary urgency/frequency--unchanged.   Past Medical History  Diagnosis Date  . CAD (coronary artery disease)     post CABG with prior percutaneous intervention of teh saphenous vein graft with known saphenous vein graft disease.  Marland Kitchen AAA (abdominal aortic aneurysm) 2010    5.8cm  . Aneurysm of thoracic aorta 1995    Rupture 1995 w/spontaneous resolution  . Popliteal artery aneurysm   . Myocardial infarction   . Ischemic cardiomyopathy   . Paroxysmal atrial fibrillation   . Hypertension   . Peripheral vascular disease   . Dyslipidemia   . Diabetes mellitus 2007    dx'd 2007 w/persisting FBS>125  . GERD (gastroesophageal reflux disease)   . Hiatal hernia   . Diverticular disease   . BPH (benign prostatic hypertrophy)     with hx of acute urinary retention (pt can self-cath)  . COPD (chronic obstructive pulmonary disease)     05/09/08 PFT FEV1 1.84 (55%), FVC 4.42 (95%), TLC 6.46 (92%), DLCO 72%, no BD response  . Pneumonia 1/09  . Allergic rhinitis   . Shingles     X 2 episodes, both in left scapula area  . OSA (obstructive sleep apnea) 06/14/09    PSG RDI 17, PLMI 96, intolerant of CPAP or BPAP  . Chronic renal insufficiency     Baseline Cr 1.5  . Prostate cancer 07/2008    Low grade;  watchful waiting with [name]  . Pulmonary nodule, left 03/02/2010    Left base; resolved on f/u CT  . Allergic rhinitis   . Carpal tunnel syndrome of left wrist 02/11/2011  . IMPLANTATION OF DEFIBRILLATOR, HX OF 09/02/2008  . IRON DEFICIENCY 06/08/2010    Hemoccult negative:  GI ([name]) recommended no invasive diagnostics--watchful waiting (2012).  . UNSPECIFIED ANEMIA 06/01/2010    Multifactorial: iron def, CRI, vit B12 def,     Past Surgical History  Procedure Date  . Cardiac defibrillator placement 08/28/2004    [name]  . Colon surgery 1995    partial colectomy  . Coronary artery bypass graft 1996  . Aorta - bilateral femoral artery bypass graft 1999    [name] Early  . Nose surgery   . Insertion of pacing lead     New rate sensing pacing lead with removal of a previous implanted ICD and insertion of a device back in the pocket with defibrillation threshold testing  . Abdominal aortic aneurysm repair 1999    Ingrarenal repair/ graft  . Stents     Placed to the saphenous vein graft  . Radiofrequency ablation 2006    A flutter    Outpatient Prescriptions Prior to Visit  Medication Sig Dispense Refill  . acetaminophen (TYLENOL) 325 MG tablet Take 325 mg  by mouth every 6 (six) hours as needed. For pain      . albuterol (PROVENTIL) (2.5 MG/3ML) 0.083% nebulizer solution Take 2.5 mg by nebulization every 6 (six) hours as needed. For shortness of breath      . amiodarone (PACERONE) 200 MG tablet TAKE 2 TABLETS BY MOUTH TWICE A DAY  120 tablet  3  . amoxicillin-clavulanate (AUGMENTIN) 500-125 MG per tablet 1 tab po bid x 10d  20 tablet  0  . aspirin 81 MG tablet Take 81 mg by mouth daily.        Marland Kitchen BENICAR 20 MG tablet TAKE ONE-HALF TABLET BY MOUTH DAILY  30 tablet  3  . budesonide (PULMICORT) 0.25 MG/2ML nebulizer solution Take 2 mLs (0.25 mg total) by nebulization 2 (two) times daily.  60 mL  5  . budesonide (RHINOCORT AQUA) 32 MCG/ACT nasal spray Place 2  sprays into the nose daily.  1 Bottle  12  . carvedilol (COREG) 3.125 MG tablet Take 1 tablet (3.125 mg total) by mouth 2 (two) times daily.  60 tablet  11  . cetirizine (ZYRTEC) 10 MG tablet Take 10 mg by mouth daily.        . clotrimazole-betamethasone (LOTRISONE) cream Apply 1 application topically 2 (two) times daily as needed. For skin irritation      . cyanocobalamin 1000 MCG/ML injection Inject into the muscle every 30 (thirty) days.        Marland Kitchen desoximetasone (TOPICORT) 0.25 % cream Apply 1 application topically 2 (two) times daily as needed. For rash      . esomeprazole (NEXIUM) 40 MG capsule Take 1 capsule (40 mg total) by mouth daily.  30 capsule  11  . finasteride (PROSCAR) 5 MG tablet Take 5 mg by mouth daily.        . furosemide (LASIX) 40 MG tablet Take 1 tablet (40 mg total) by mouth daily.  30 tablet  3  . guaiFENesin-dextromethorphan (ROBITUSSIN DM) 100-10 MG/5ML syrup Take 5 mLs by mouth 3 (three) times daily as needed. As needed for cough      . hydrOXYzine (ATARAX/VISTARIL) 10 MG tablet 1 tab po qhs prn insomnia  30 tablet  3  . ipratropium (ATROVENT) 0.03 % nasal spray Two sprays three times a day as needed before meals  30 mL  1  . isosorbide mononitrate (IMDUR) 60 MG 24 hr tablet Take 60 mg by mouth daily.        . isosorbide mononitrate (IMDUR) 60 MG 24 hr tablet Take 1 tablet (60 mg total) by mouth every morning.  30 tablet  11  . KLOR-CON 10 10 MEQ CR tablet TAKE 1 TABLET BY MOUTH EVERY DAY  30 tablet  6  . levalbuterol (XOPENEX HFA) 45 MCG/ACT inhaler Inhale 1-2 puffs into the lungs every 4 (four) hours as needed. For shortness of breath       . levothyroxine (SYNTHROID, LEVOTHROID) 125 MCG tablet Take 1 mcg by mouth as directed. Alternate 1 tab qd with 1/2 tab qd       . Linaclotide (LINZESS) 145 MCG CAPS Take 145 mcg by mouth every morning. (empty stomach)  20 capsule  0  . lithium 300 MG capsule Take 300 mg by mouth 2 (two) times daily with a meal.        .  nitroGLYCERIN (NITROSTAT) 0.3 MG SL tablet Place 0.3 mg under the tongue every 5 (five) minutes as needed. For chest pain      . NON  FORMULARY 2 Act daily. Flutter Valve.       . NON FORMULARY as directed. Home nebulizer.       . predniSONE (DELTASONE) 10 MG tablet 1 tab po qd x 7d, then 1/2 tab po qd x 8d  11 tablet  0  . rosuvastatin (CRESTOR) 5 MG tablet Take 5 mg by mouth once a week. Sundays      . sitaGLIPtin (JANUVIA) 100 MG tablet Take 1 tablet (100 mg total) by mouth daily.  30 tablet  3  . Tamsulosin HCl (FLOMAX) 0.4 MG CAPS Take 0.4 mg by mouth daily.        Marland Kitchen tiotropium (SPIRIVA) 18 MCG inhalation capsule Place 1 capsule (18 mcg total) into inhaler and inhale daily.  30 capsule  3  . warfarin (COUMADIN) 5 MG tablet TAKE AS DIRECTED PER COUMADIN CLINIC  30 tablet  3  . guaiFENesin (MUCINEX) 600 MG 12 hr tablet Take 1,200 mg by mouth 2 (two) times daily as needed. For congestion      . levalbuterol (XOPENEX HFA) 45 MCG/ACT inhaler Inhale 1-2 puffs into the lungs every 6 (six) hours as needed.  1 Inhaler  6  . tiotropium (SPIRIVA HANDIHALER) 18 MCG inhalation capsule Place 1 capsule (18 mcg total) into inhaler and inhale daily.  30 capsule  3    Allergies  Allergen Reactions  . Atorvastatin     REACTION: muscle ache in legs  . Crestor (Rosuvastatin Calcium) Other (See Comments)    Lower ext fatigue and soreness  . Lisinopril Diarrhea  . Morphine     REACTION: AMS - agitation  . Prednisone     REACTION: Can't breath  . Zolpidem Tartrate     REACTION: AMS    ROS As per HPI  PE: Blood pressure 115/66, pulse 61, height 5' 10.5" (1.791 m), weight 167 lb (75.751 kg). Gen: Alert, well appearing.  Patient is oriented to person, place, time, and situation. ENT: Ears: EACs clear, normal epithelium.  TMs with good light reflex and landmarks bilaterally.  Eyes: no injection, icteris, swelling, or exudate.  EOMI, PERRLA. Nose: no drainage or turbinate edema/swelling.  No injection or  focal lesion.  Mouth: lips without lesion/swelling.  Oral mucosa pink and moist.  Dentition intact and without obvious caries or gingival swelling.  Oropharynx without erythema, exudate, or swelling.  Neck - No masses or thyromegaly or limitation in range of motion CV: RRR, no m/r/g.   LUNGS: CTA bilat, nonlabored resps, good aeration in all lung fields. ABD: soft, NT, ND EXT: no clubbing, cyanosis, or edema.  BACK: no CVA tenderness, no rash.  He has mild right lumbar spine level muscle tenderness to palpation diffusely that stops at the flank region.  No midline spinal tenderness.    LABS:  Lab Results  Component Value Date   INR 2.6 08/06/2011   INR 3.2 07/17/2011   INR 2.6 06/19/2011   PROTIME 17.1 10/19/2008     IMPRESSION AND PLAN:  DIABETES MELLITUS, TYPE II Glucoses good fasting. Would like a few checks postprandially. Over the last couple of years his control has been excellent. Will wait until next DM f/u to do next HbA1c. Continue current med. Due for annual urine microalbumin check, so this was ordered today.  ISCHEMIC CARDIOMYOPATHY As per cardiology mgmt: recent check up with Dr. Riley Kill was uneventful per pt report.  ANEURYSM, THORACIC AORTIC +AAA.  Dr. Arbie Cookey follows him closely for this problem, next f/u exam and CT scheduled for  09/01/11 per pt report today.  COPD Improved s/p low dose steroids for recent mild exacerbation with URI.  Low back pain Reassured pt that his pain today is likely musculoskeletal, encouraged heating pain use, gentle stretches, no heavy lifting/straining.  Constipation, chronic Improved on linzess.  Continue this daily or as needed (he has many samples).  B12 deficiency Due for injection today.  Chronic rhinitis Failed flonase and nasonex. Recently responded well to rhinocort but insurance dictates that he'll have to switch to flunisolide or triamcinolone acetonide. I rx'd flunisolide 2 sprays bid for him today.     FOLLOW UP:  Return in about 4 months (around 12/16/2011) for f/u chronic illness.

## 2011-08-17 LAB — MICROALBUMIN / CREATININE URINE RATIO
Creatinine, Urine: 86.8 mg/dL
Microalb, Ur: 0.6 mg/dL (ref 0.00–1.89)

## 2011-08-17 NOTE — Progress Notes (Signed)
HPI:   He is holding his own.  Continues to follow with Dr. Arbie Cookey with a conservative management plan given comorbidities.  No chest pain.  Overall status unchanged.    Current Outpatient Prescriptions  Medication Sig Dispense Refill  . acetaminophen (TYLENOL) 325 MG tablet Take 325 mg by mouth every 6 (six) hours as needed. For pain      . albuterol (PROVENTIL) (2.5 MG/3ML) 0.083% nebulizer solution Take 2.5 mg by nebulization every 6 (six) hours as needed. For shortness of breath      . aspirin 81 MG tablet Take 81 mg by mouth daily.        Marland Kitchen BENICAR 20 MG tablet TAKE ONE-HALF TABLET BY MOUTH DAILY  30 tablet  3  . carvedilol (COREG) 3.125 MG tablet Take 1 tablet (3.125 mg total) by mouth 2 (two) times daily.  60 tablet  11  . cetirizine (ZYRTEC) 10 MG tablet Take 10 mg by mouth daily.        . clotrimazole-betamethasone (LOTRISONE) cream Apply 1 application topically 2 (two) times daily as needed. For skin irritation      . cyanocobalamin 1000 MCG/ML injection Inject into the muscle every 30 (thirty) days.        Marland Kitchen desoximetasone (TOPICORT) 0.25 % cream Apply 1 application topically 2 (two) times daily as needed. For rash      . esomeprazole (NEXIUM) 40 MG capsule Take 1 capsule (40 mg total) by mouth daily.  30 capsule  11  . finasteride (PROSCAR) 5 MG tablet Take 5 mg by mouth daily.        . furosemide (LASIX) 40 MG tablet Take 1 tablet (40 mg total) by mouth daily.  30 tablet  3  . guaiFENesin (MUCINEX) 600 MG 12 hr tablet Take 1,200 mg by mouth 2 (two) times daily as needed. For congestion      . guaiFENesin-dextromethorphan (ROBITUSSIN DM) 100-10 MG/5ML syrup Take 5 mLs by mouth 3 (three) times daily as needed. As needed for cough      . hydrOXYzine (ATARAX/VISTARIL) 10 MG tablet 1 tab po qhs prn insomnia  30 tablet  3  . ipratropium (ATROVENT) 0.03 % nasal spray Two sprays three times a day as needed before meals  30 mL  1  . isosorbide mononitrate (IMDUR) 60 MG 24 hr tablet Take 60  mg by mouth daily.        Marland Kitchen KLOR-CON 10 10 MEQ CR tablet TAKE 1 TABLET BY MOUTH EVERY DAY  30 tablet  6  . levalbuterol (XOPENEX HFA) 45 MCG/ACT inhaler Inhale 1-2 puffs into the lungs every 4 (four) hours as needed. For shortness of breath       . levothyroxine (SYNTHROID, LEVOTHROID) 125 MCG tablet Take 1 mcg by mouth as directed. Alternate 1 tab qd with 1/2 tab qd       . Linaclotide (LINZESS) 145 MCG CAPS Take 145 mcg by mouth every morning. (empty stomach)  20 capsule  0  . lithium 300 MG capsule Take 300 mg by mouth 2 (two) times daily with a meal.        . nitroGLYCERIN (NITROSTAT) 0.3 MG SL tablet Place 0.3 mg under the tongue every 5 (five) minutes as needed. For chest pain      . NON FORMULARY 2 Act daily. Flutter Valve.       . NON FORMULARY as directed. Home nebulizer.       . rosuvastatin (CRESTOR) 5 MG tablet Take 5  mg by mouth once a week. "Sundays      . sitaGLIPtin (JANUVIA) 100 MG tablet Take 1 tablet (100 mg total) by mouth daily.  30 tablet  3  . Tamsulosin HCl (FLOMAX) 0.4 MG CAPS Take 0.4 mg by mouth daily.        . tiotropium (SPIRIVA) 18 MCG inhalation capsule Place 1 capsule (18 mcg total) into inhaler and inhale daily.  30 capsule  3  . warfarin (COUMADIN) 5 MG tablet TAKE AS DIRECTED PER COUMADIN CLINIC  30 tablet  3  . amiodarone (PACERONE) 200 MG tablet TAKE 2 TABLETS BY MOUTH TWICE A DAY  120 tablet  3  . amoxicillin-clavulanate (AUGMENTIN) 500-125 MG per tablet 1 tab po bid x 10d  20 tablet  0  . budesonide (PULMICORT) 0.25 MG/2ML nebulizer solution Take 2 mLs (0.25 mg total) by nebulization 2 (two) times daily.  60 mL  5  . budesonide (RHINOCORT AQUA) 32 MCG/ACT nasal spray Place 2 sprays into the nose daily.  1 Bottle  12  . flunisolide (NASALIDE) 0.025 % SOLN Inhale 2 sprays into the lungs 2 (two) times daily.  1 Bottle  11  . isosorbide mononitrate (IMDUR) 60 MG 24 hr tablet Take 1 tablet (60 mg total) by mouth every morning.  30 tablet  11  . levalbuterol  (XOPENEX HFA) 45 MCG/ACT inhaler Inhale 1-2 puffs into the lungs every 6 (six) hours as needed.  1 Inhaler  6  . predniSONE (DELTASONE) 10 MG tablet 1 tab po qd x 7d, then 1/2 tab po qd x 8d  11 tablet  0  . tiotropium (SPIRIVA HANDIHALER) 18 MCG inhalation capsule Place 1 capsule (18 mcg total) into inhaler and inhale daily.  30 capsule  3  . DISCONTD: rosuvastatin (CRESTOR) 5 MG tablet Take 1 tablet (5 mg total) by mouth at bedtime.  100 tablet  3    Allergies  Allergen Reactions  . Atorvastatin     REACTION: muscle ache in legs  . Crestor (Rosuvastatin Calcium) Other (See Comments)    Lower ext fatigue and soreness  . Lisinopril Diarrhea  . Morphine     REACTION: AMS - agitation  . Prednisone     REACTION: Can't breath  . Zolpidem Tartrate     REACTION: AMS    Past Medical History  Diagnosis Date  . CAD (coronary artery disease)     post CABG with prior percutaneous intervention of teh saphenous vein graft with known saphenous vein graft disease.  . AAA (abdominal aortic aneurysm) 2010    5.8cm  . Aneurysm of thoracic aorta 1995    Rupture 1995 w/spontaneous resolution  . Popliteal artery aneurysm   . Myocardial infarction   . Ischemic cardiomyopathy   . Paroxysmal atrial fibrillation   . Hypertension   . Peripheral vascular disease   . Dyslipidemia   . Diabetes mellitus 2007    dx'd 2007 w/persisting FBS>125  . GERD (gastroesophageal reflux disease)   . Hiatal hernia   . Diverticular disease   . BPH (benign prostatic hypertrophy)     with hx of acute urinary retention (pt can self-cath)  . COPD (chronic obstructive pulmonary disease)     12" /28/09 PFT FEV1 1.84 (55%), FVC 4.42 (95%), TLC 6.46 (92%), DLCO 72%, no BD response  . Pneumonia 1/09  . Allergic rhinitis   . Shingles   . OSA (obstructive sleep apnea) 06/14/09    PSG RDI 17, PLMI 96, intolerant of CPAP or BPAP  .  Chronic renal insufficiency     Baseline Cr 1.5  . Prostate cancer 07/2008    Low grade;  watchful waiting with [name]  . Pulmonary nodule, left 03/02/2010    Left base; resolved on f/u CT  . Allergic rhinitis   . Carpal tunnel syndrome of left wrist 02/11/2011  . IMPLANTATION OF DEFIBRILLATOR, HX OF 09/02/2008  . IRON DEFICIENCY 06/08/2010    Hemoccult negative:  GI ([name]) recommended no invasive diagnostics--watchful waiting (2012).  . UNSPECIFIED ANEMIA 06/01/2010    Multifactorial: iron def, CRI, vit B12 def,     Past Surgical History  Procedure Date  . Cardiac defibrillator placement 08/28/2004    [name]  . Colon surgery 1995    partial colectomy  . Coronary artery bypass graft 1996  . Aorta - bilateral femoral artery bypass graft 1999    [name] Early  . Nose surgery   . Insertion of pacing lead     New rate sensing pacing lead with removal of a previous implanted ICD and insertion of a device back in the pocket with defibrillation threshold testing  . Abdominal aortic aneurysm repair 1999    Ingrarenal repair/ graft  . Stents     Placed to the saphenous vein graft  . Radiofrequency ablation 2006    A flutter    Family History  Problem Relation Age of Onset  . Stroke Mother 37  . Heart attack Mother     CVA  . Cancer Father 36    Lung  . Heart attack Father 60  . Lung cancer Father   . Coronary artery disease Other     2 of 5 siblings with CAD    History   Social History  . Marital Status: Married    Spouse Name: N/A    Number of Children: N/A  . Years of Education: N/A   Occupational History  . RETIRED     Mechanic   Social History Main Topics  . Smoking status: Former Smoker -- 1.0 packs/day for 30 years    Types: Cigarettes    Quit date: 05/23/2003  . Smokeless tobacco: Former Neurosurgeon    Types: Chew    Quit date: 05/14/2003   Comment: 80 pack a year history  . Alcohol Use: 0.6 oz/week    1 Cans of beer per week     only occ  . Drug Use: No  . Sexually Active: Not on file   Other Topics Concern  . Not on  file   Social History Narrative   Lives in [place] with his wife.Very involved in his church.Enjoys bluegrass music, going to [employer], spending time with extended family.Used to play golf twice a week up until 2012.No alc/drug use.Former smoker, quit 2005.    ROS: Please see the HPI.  All other systems reviewed and negative.  PHYSICAL EXAM:  BP 130/80  Pulse 56  Ht 5' 11.5" (1.816 m)  Wt 166 lb (75.297 kg)  BMI 22.83 kg/m2  General: Well developed, well nourished, in no acute distress. Head:  Normocephalic and atraumatic. Neck: no JVD Lungs: Clear to auscultation and percussion. Heart: Normal S1 and S2.  Prominent mid systolic click.  Late murmur.   Abdomen:  Normal bowel sounds; soft; non tender; no organomegaly Extremities: No clubbing or cyanosis. No edema. Neurologic: Alert and oriented x 3.  EKG:  SB.  Right superior axis.  Anteroseptal MI, age indeterminate.    ASSESSMENT AND PLAN:

## 2011-08-18 ENCOUNTER — Encounter: Payer: Self-pay | Admitting: Family Medicine

## 2011-08-18 NOTE — Assessment & Plan Note (Signed)
Reassured pt that his pain today is likely musculoskeletal, encouraged heating pain use, gentle stretches, no heavy lifting/straining.

## 2011-08-18 NOTE — Assessment & Plan Note (Signed)
Glucoses good fasting. Would like a few checks postprandially. Over the last couple of years his control has been excellent. Will wait until next DM f/u to do next HbA1c. Continue current med. Due for annual urine microalbumin check, so this was ordered today.

## 2011-08-18 NOTE — Assessment & Plan Note (Signed)
Failed flonase and nasonex. Recently responded well to rhinocort but insurance dictates that he'll have to switch to flunisolide or triamcinolone acetonide. I rx'd flunisolide 2 sprays bid for him today.

## 2011-08-18 NOTE — Assessment & Plan Note (Signed)
Due for injection today

## 2011-08-18 NOTE — Assessment & Plan Note (Signed)
+  AAA.  Dr. Arbie Cookey follows him closely for this problem, next f/u exam and CT scheduled for 09/01/11 per pt report today.

## 2011-08-18 NOTE — Assessment & Plan Note (Signed)
Improved on linzess.  Continue this daily or as needed (he has many samples).

## 2011-08-18 NOTE — Assessment & Plan Note (Signed)
Improved s/p low dose steroids for recent mild exacerbation with URI.

## 2011-08-18 NOTE — Assessment & Plan Note (Signed)
As per cardiology mgmt: recent check up with Dr. Riley Kill was uneventful per pt report.

## 2011-08-26 ENCOUNTER — Other Ambulatory Visit: Payer: Self-pay | Admitting: Pulmonary Disease

## 2011-08-26 ENCOUNTER — Other Ambulatory Visit: Payer: Self-pay | Admitting: *Deleted

## 2011-08-26 DIAGNOSIS — E119 Type 2 diabetes mellitus without complications: Secondary | ICD-10-CM

## 2011-08-26 MED ORDER — SITAGLIPTIN PHOSPHATE 100 MG PO TABS: 100.0000 mg | ORAL_TABLET | Freq: Every day | ORAL | Status: DC

## 2011-08-26 MED ORDER — LEVALBUTEROL TARTRATE 45 MCG/ACT IN AERO: 1.0000 | INHALATION_SPRAY | RESPIRATORY_TRACT | Status: DC | PRN

## 2011-08-26 NOTE — Telephone Encounter (Signed)
Vm left by [facility] stating they had requested refill on Januvia.  RC to [place], she states they faxed on 08/19/11.  Advised we never received request.   Pt was last seen on 08/16/11, should follow up on 12/2011.  #30 x 3 refills called to [facility].

## 2011-08-28 ENCOUNTER — Encounter: Payer: Self-pay | Admitting: Pulmonary Disease

## 2011-08-28 ENCOUNTER — Other Ambulatory Visit: Payer: Self-pay | Admitting: *Deleted

## 2011-08-28 ENCOUNTER — Ambulatory Visit (INDEPENDENT_AMBULATORY_CARE_PROVIDER_SITE_OTHER): Payer: Medicare Other | Admitting: Pulmonary Disease

## 2011-08-28 ENCOUNTER — Other Ambulatory Visit: Payer: Self-pay | Admitting: Cardiovascular Disease

## 2011-08-28 VITALS — BP 102/58 | HR 59 | Temp 97.7°F | Ht 71.5 in | Wt 167.0 lb

## 2011-08-28 DIAGNOSIS — J449 Chronic obstructive pulmonary disease, unspecified: Secondary | ICD-10-CM

## 2011-08-28 MED ORDER — LITHIUM CARBONATE ER 300 MG PO TBCR: 300.0000 mg | EXTENDED_RELEASE_TABLET | Freq: Two times a day (BID) | ORAL | Status: DC

## 2011-08-28 MED ORDER — LEVALBUTEROL TARTRATE 45 MCG/ACT IN AERO: 1.0000 | INHALATION_SPRAY | RESPIRATORY_TRACT | Status: DC | PRN

## 2011-08-28 MED ORDER — PREDNISONE 10 MG PO TABS: ORAL_TABLET | ORAL | Status: AC

## 2011-08-28 NOTE — Progress Notes (Signed)
Chief Complaint  Patient presents with  . Follow-up    Pt states he has had a rough time withhis breathing and chest tx x 3 weeks. denies any cough, wheezing, runny nose, sore throat  . Medication Refill    xopenex    History of Present Illness: RUMEAL CULLIPHER is a 76 y.o. male with COPD, and rhinitis.  He has noticed more trouble with his breathing over the past few weeks.  He had a cold then with sinus congestion. This has improved some.  He gets winded with exertion, and sometimes at rest.  He is not having much cough or sputum.  He does get a tight feeling in his chest.    He uses spiriva daily, and pulmicort with xopenex twice per day.    Past Medical History  Diagnosis Date  . CAD (coronary artery disease)     post CABG with prior percutaneous intervention of teh saphenous vein graft with known saphenous vein graft disease.  Marland Kitchen AAA (abdominal aortic aneurysm) 2010    5.8cm  . Aneurysm of thoracic aorta 1995    Rupture 1995 w/spontaneous resolution  . Popliteal artery aneurysm   . Myocardial infarction   . Ischemic cardiomyopathy   . Paroxysmal atrial fibrillation   . Hypertension   . Peripheral vascular disease   . Dyslipidemia   . Diabetes mellitus 2007    dx'd 2007 w/persisting FBS>125  . GERD (gastroesophageal reflux disease)   . Hiatal hernia   . Diverticular disease   . BPH (benign prostatic hypertrophy)     with hx of acute urinary retention (pt can self-cath)  . COPD (chronic obstructive pulmonary disease)     05/09/08 PFT FEV1 1.84 (55%), FVC 4.42 (95%), TLC 6.46 (92%), DLCO 72%, no BD response  . Pneumonia 1/09  . Allergic rhinitis   . Shingles     X 2 episodes, both in left scapula area  . OSA (obstructive sleep apnea) 06/14/09    PSG RDI 17, PLMI 96, intolerant of CPAP or BPAP  . Chronic renal insufficiency     Baseline Cr 1.5  . Prostate cancer 07/2008    Low grade; watchful waiting with [name]  . Pulmonary nodule, left 03/02/2010    Left base;  resolved on f/u CT  . Allergic rhinitis   . Carpal tunnel syndrome of left wrist 02/11/2011  . IMPLANTATION OF DEFIBRILLATOR, HX OF 09/02/2008  . IRON DEFICIENCY 06/08/2010    Hemoccult negative:  GI ([name]) recommended no invasive diagnostics--watchful waiting (2012).  . UNSPECIFIED ANEMIA 06/01/2010    Multifactorial: iron def, CRI, vit B12 def,     Past Surgical History  Procedure Date  . Cardiac defibrillator placement 08/28/2004    [name]  . Colon surgery 1995    partial colectomy  . Coronary artery bypass graft 1996  . Aorta - bilateral femoral artery bypass graft 1999    [name] Early  . Nose surgery   . Insertion of pacing lead     New rate sensing pacing lead with removal of a previous implanted ICD and insertion of a device back in the pocket with defibrillation threshold testing  . Abdominal aortic aneurysm repair 1999    Ingrarenal repair/ graft  . Stents     Placed to the saphenous vein graft  . Radiofrequency ablation 2006    A flutter    Allergies  Allergen Reactions  . Atorvastatin     REACTION: muscle ache in legs  .  Crestor (Rosuvastatin Calcium) Other (See Comments)    Lower ext fatigue and soreness  . Lisinopril Diarrhea  . Morphine     REACTION: AMS - agitation  . Prednisone     REACTION: Can't breath  . Zolpidem Tartrate     REACTION: AMS    Physical Exam:  Blood pressure 102/58, pulse 59, temperature 97.7 F (36.5 C), temperature source Oral, height 5' 11.5" (1.816 m), weight 167 lb (75.751 kg), SpO2 95.00%. Body mass index is 22.97 kg/(m^2). Wt Readings from Last 2 Encounters:  08/28/11 167 lb (75.751 kg)  08/16/11 167 lb (75.751 kg)    General - thin, no distress  HEENT - No sinus tenderness, clear nasal drainage, no oral exudate, no LAN  Cardiac - s1s2 no murmur  Chest - diminished breath sounds, faint wheeze at bases, no rales  Abd - soft, nontender  Ext - no edema  Neuro - normal strength, CN intact, A&O x 3    Psych - normal behavior/mood  Skin - multiple ecchysmoses  Spirometry 08/28/11>>FEV1 1.51 (43%), FEV1% 48   Assessment/Plan:  Outpatient Encounter Prescriptions as of 08/28/2011  Medication Sig Dispense Refill  . acetaminophen (TYLENOL) 325 MG tablet Take 325 mg by mouth every 6 (six) hours as needed. For pain      . albuterol (PROVENTIL) (2.5 MG/3ML) 0.083% nebulizer solution Take 2.5 mg by nebulization every 6 (six) hours as needed. For shortness of breath      . amiodarone (PACERONE) 200 MG tablet       . aspirin 81 MG tablet Take 81 mg by mouth daily.        Marland Kitchen BENICAR 20 MG tablet TAKE ONE-HALF TABLET BY MOUTH DAILY  30 tablet  3  . budesonide (PULMICORT) 0.25 MG/2ML nebulizer solution Take 2 mLs (0.25 mg total) by nebulization 2 (two) times daily.  60 mL  5  . budesonide (RHINOCORT AQUA) 32 MCG/ACT nasal spray Place 2 sprays into the nose daily.  1 Bottle  12  . carvedilol (COREG) 3.125 MG tablet Take 1 tablet (3.125 mg total) by mouth 2 (two) times daily.  60 tablet  11  . cetirizine (ZYRTEC) 10 MG tablet Take 10 mg by mouth daily.        . clotrimazole-betamethasone (LOTRISONE) cream Apply 1 application topically 2 (two) times daily as needed. For skin irritation      . cyanocobalamin 1000 MCG/ML injection Inject into the muscle every 30 (thirty) days.        Marland Kitchen desoximetasone (TOPICORT) 0.25 % cream Apply 1 application topically 2 (two) times daily as needed. For rash      . esomeprazole (NEXIUM) 40 MG capsule Take 1 capsule (40 mg total) by mouth daily.  30 capsule  11  . finasteride (PROSCAR) 5 MG tablet Take 5 mg by mouth daily.        . flunisolide (NASALIDE) 0.025 % SOLN Inhale into the lungs. 2 sprays twice a day      . furosemide (LASIX) 40 MG tablet Take 1 tablet (40 mg total) by mouth daily.  30 tablet  3  . guaiFENesin (MUCINEX) 600 MG 12 hr tablet Take 1,200 mg by mouth 2 (two) times daily as needed. For congestion      . guaiFENesin-dextromethorphan (ROBITUSSIN DM)  100-10 MG/5ML syrup Take 5 mLs by mouth 3 (three) times daily as needed. As needed for cough      . hydrOXYzine (ATARAX/VISTARIL) 10 MG tablet 1 tab po qhs prn insomnia  30 tablet  3  . ipratropium (ATROVENT) 0.03 % nasal spray Two sprays three times a day as needed before meals  30 mL  1  . isosorbide mononitrate (IMDUR) 60 MG 24 hr tablet Take 1 tablet (60 mg total) by mouth every morning.  30 tablet  11  . KLOR-CON 10 10 MEQ CR tablet TAKE 1 TABLET BY MOUTH EVERY DAY  30 tablet  6  . levalbuterol (XOPENEX HFA) 45 MCG/ACT inhaler Inhale 1-2 puffs into the lungs every 4 (four) hours as needed. For shortness of breath  1 Inhaler  6  . levothyroxine (SYNTHROID, LEVOTHROID) 125 MCG tablet Take 1 mcg by mouth as directed. Alternate 1 tab qd with 1/2 tab qd       . lithium 300 MG capsule Take 300 mg by mouth 2 (two) times daily with a meal.        . nitroGLYCERIN (NITROSTAT) 0.3 MG SL tablet Place 0.3 mg under the tongue every 5 (five) minutes as needed. For chest pain      . NON FORMULARY 2 Act daily. Flutter Valve.       . NON FORMULARY as directed. Home nebulizer.       . rosuvastatin (CRESTOR) 5 MG tablet Take 5 mg by mouth once a week. Sundays      . sitaGLIPtin (JANUVIA) 100 MG tablet Take 1 tablet (100 mg total) by mouth daily.  30 tablet  3  . Tamsulosin HCl (FLOMAX) 0.4 MG CAPS Take 0.4 mg by mouth daily.        Marland Kitchen tiotropium (SPIRIVA) 18 MCG inhalation capsule Place 1 capsule (18 mcg total) into inhaler and inhale daily.  30 capsule  3  . warfarin (COUMADIN) 5 MG tablet TAKE AS DIRECTED PER COUMADIN CLINIC  30 tablet  3  . DISCONTD: amiodarone (PACERONE) 200 MG tablet TAKE 2 TABLETS BY MOUTH TWICE A DAY  120 tablet  3  . DISCONTD: flunisolide (NASALIDE) 0.025 % SOLN Inhale 2 sprays into the lungs 2 (two) times daily.  1 Bottle  11  . DISCONTD: levalbuterol (XOPENEX HFA) 45 MCG/ACT inhaler Inhale 1-2 puffs into the lungs every 4 (four) hours as needed. For shortness of breath  1 Inhaler  6  .  levalbuterol (XOPENEX HFA) 45 MCG/ACT inhaler Inhale 1-2 puffs into the lungs every 6 (six) hours as needed.  1 Inhaler  6  . predniSONE (DELTASONE) 10 MG tablet 2 pills for 2 days, 1 pill for 2 days, 1/2 pill for 2 day  7 tablet  0  . tiotropium (SPIRIVA HANDIHALER) 18 MCG inhalation capsule Place 1 capsule (18 mcg total) into inhaler and inhale daily.  30 capsule  3  . DISCONTD: amoxicillin-clavulanate (AUGMENTIN) 500-125 MG per tablet 1 tab po bid x 10d  20 tablet  0  . DISCONTD: carvedilol (COREG) 3.125 MG tablet TAKE 1 TABLET BY MOUTH TWICE A DAY  60 tablet  6  . DISCONTD: isosorbide mononitrate (IMDUR) 60 MG 24 hr tablet Take 60 mg by mouth daily.        Marland Kitchen DISCONTD: Linaclotide (LINZESS) 145 MCG CAPS Take 145 mcg by mouth every morning. (empty stomach)  20 capsule  0  . DISCONTD: predniSONE (DELTASONE) 10 MG tablet 1 tab po qd x 7d, then 1/2 tab po qd x 8d  11 tablet  0    Haley Roza Pager:  [phone] 08/28/2011, 10:38 AM

## 2011-08-28 NOTE — Patient Instructions (Signed)
Prednisone 10 mg pills>>2 pills for 2 days, 1 pill for 2 days, 1/2 pill for 2 days Call if no better after prednisone Follow up in 6 months

## 2011-08-28 NOTE — Telephone Encounter (Signed)
Faxed refill request received from pharmacy for Last filled by MD on 02/05/11, 60 x 6  Last seen on 08/16/11 Follow up 12/2011

## 2011-08-28 NOTE — Assessment & Plan Note (Signed)
Will give him short course of prednisone.  He is to continue spiriva, and schedule pulmicort with xopenex.

## 2011-08-29 ENCOUNTER — Telehealth: Payer: Self-pay | Admitting: Pulmonary Disease

## 2011-08-29 NOTE — Telephone Encounter (Signed)
Pt stated he is returning Mindy's call & can be reached at [phone].  Antionette Fairy

## 2011-08-29 NOTE — Telephone Encounter (Signed)
Spoke to patient and let him know we left a sample of xopenex mdi  out front, PA needs to be done per pharmacy the number to call is [phone], id # [id number]

## 2011-08-30 NOTE — Telephone Encounter (Signed)
I called to initiate PA. Was advised it will have to go into review. Will await approval

## 2011-09-02 LAB — HEMOCCULT SLIDES (X 3 CARDS)
Fecal Occult Blood: NEGATIVE
OCCULT 2: NEGATIVE
OCCULT 3: NEGATIVE
OCCULT 4: NEGATIVE
OCCULT 5: NEGATIVE

## 2011-09-02 NOTE — Telephone Encounter (Signed)
Called [facility], spoke with Dorene Grebe who reported that pt's xopenex has been approved thru 12.31.13.  Approval letter to be faxed to the triage fax machine.  Called [facility], advised pt's xopenex hfa has been approved.  Called spoke with patient and made him aware of this as well.  Will sign off.

## 2011-09-04 ENCOUNTER — Telehealth: Payer: Self-pay

## 2011-09-04 ENCOUNTER — Other Ambulatory Visit: Payer: Self-pay | Admitting: Pulmonary Disease

## 2011-09-04 MED ORDER — TIOTROPIUM BROMIDE MONOHYDRATE 18 MCG IN CAPS: 18.0000 ug | ORAL_CAPSULE | Freq: Every day | RESPIRATORY_TRACT | Status: DC

## 2011-09-04 NOTE — Telephone Encounter (Signed)
Message copied by Court Joy on Wed Sep 04, 2011 10:32 AM ------      Message from: Danise Edge A      Created: Mon Sep 02, 2011  4:23 PM      Regarding: RE: hemocult results       Please notify patient hemeoccults are negative, will be loaded in system soon      ----- Message -----         From: Berline Lopes McIvor         Sent: 09/02/2011   3:54 PM           To: Bradd Canary, MD      Subject: hemocult results                                         Spoke with lab [phone]) at [facility],(another tech verbally gave the results) x6 negative and states she has no idea why results are not in system, I asked if she can load them in she said"sure".

## 2011-09-04 NOTE — Telephone Encounter (Signed)
Received refill request for spiriva from [facility] [address] in [place].  Pt last seen by VS 4.17.13 Med last filled 10.25.13 #30 with 2 refills.  Refills sent to pharmacy #30 with 5 refills.

## 2011-09-04 NOTE — Telephone Encounter (Signed)
Left a detailed message on patients answering machine.

## 2011-09-05 ENCOUNTER — Ambulatory Visit (INDEPENDENT_AMBULATORY_CARE_PROVIDER_SITE_OTHER): Payer: Medicare Other

## 2011-09-05 DIAGNOSIS — I4891 Unspecified atrial fibrillation: Secondary | ICD-10-CM

## 2011-09-05 DIAGNOSIS — Z7901 Long term (current) use of anticoagulants: Secondary | ICD-10-CM

## 2011-09-05 LAB — POCT INR: INR: 2.6

## 2011-09-12 DIAGNOSIS — N182 Chronic kidney disease, stage 2 (mild): Secondary | ICD-10-CM | POA: Insufficient documentation

## 2011-09-12 NOTE — Assessment & Plan Note (Addendum)
Patient remains stable.  No chest pain.  Last echo EF 20-25%.  See overview.  This is NOT in EPIC.  When he returns, we should go ahead and repeat his echo at his next office visit.  However, his current hemodynamics remain stable.

## 2011-09-12 NOTE — Assessment & Plan Note (Signed)
Followed by Dr. Early 

## 2011-09-12 NOTE — Assessment & Plan Note (Signed)
Continue warfarin as scheduled

## 2011-09-12 NOTE — Assessment & Plan Note (Signed)
Monitor BMET.  Check today.

## 2011-09-18 ENCOUNTER — Other Ambulatory Visit: Payer: Self-pay | Admitting: Cardiovascular Disease

## 2011-09-23 ENCOUNTER — Other Ambulatory Visit: Payer: Self-pay | Admitting: *Deleted

## 2011-09-23 MED ORDER — LEVOTHYROXINE SODIUM 125 MCG PO TABS: ORAL_TABLET | ORAL | Status: DC

## 2011-09-23 NOTE — Telephone Encounter (Signed)
Faxed refill request received from pharmacy for levothyroxine Last filled by MD on 03/07/11 Last seen on 08/16/11 Follow up 12/2011 RX sent.

## 2011-09-28 ENCOUNTER — Telehealth: Payer: Self-pay | Admitting: Cardiology

## 2011-09-28 NOTE — Telephone Encounter (Signed)
Returned call to Mr. Plemons regarding his "pacemaker beeping". He reports hearing a beeping noise about 3 days ago and wasn't sure what it was until today when he thought it was probably his pacemaker (He had an ICD placed in 2006 and had a change out "a few years ago"). He denies ICD shock, chest pain, sob, or syncope. He does report feeling poorly for a few weeks, mainly complaining of sharp pain in shoulders and upper back. The pain improves if he sits still with his back straight in a chair. I instructed him to call the office first thing Monday morning for evaluation of ICD, but if he develops any of the symptoms above or his ICD fires he understands that he needs immediate evaluation.  Rhea Thrun PA-C 09/28/2011 10:17 AM

## 2011-09-30 ENCOUNTER — Telehealth: Payer: Self-pay | Admitting: *Deleted

## 2011-09-30 ENCOUNTER — Other Ambulatory Visit: Payer: Self-pay | Admitting: Vascular Surgery

## 2011-09-30 ENCOUNTER — Encounter: Payer: Self-pay | Admitting: Internal Medicine

## 2011-09-30 ENCOUNTER — Encounter: Payer: Medicare Other | Admitting: *Deleted

## 2011-09-30 ENCOUNTER — Encounter: Payer: Self-pay | Admitting: Vascular Surgery

## 2011-09-30 DIAGNOSIS — I428 Other cardiomyopathies: Secondary | ICD-10-CM

## 2011-09-30 NOTE — Telephone Encounter (Signed)
Patient called, stated device had been beeping every 3 hours for the past week.  Has not received any shocks.  He has a 6949 lead in place.  No longer has Field seismologist.  He states he does not have magnet at home.  Advised to have someone drive him to the office now for device interrogation.  Advised if he receives shock, to pull over and call 911.  Patient aware and agrees with plan.

## 2011-10-01 ENCOUNTER — Ambulatory Visit (INDEPENDENT_AMBULATORY_CARE_PROVIDER_SITE_OTHER): Payer: Medicare Other | Admitting: *Deleted

## 2011-10-01 ENCOUNTER — Ambulatory Visit (INDEPENDENT_AMBULATORY_CARE_PROVIDER_SITE_OTHER): Payer: Medicare Other | Admitting: Internal Medicine

## 2011-10-01 ENCOUNTER — Encounter: Payer: Self-pay | Admitting: Internal Medicine

## 2011-10-01 ENCOUNTER — Ambulatory Visit (INDEPENDENT_AMBULATORY_CARE_PROVIDER_SITE_OTHER): Payer: Medicare Other | Admitting: Vascular Surgery

## 2011-10-01 ENCOUNTER — Encounter: Payer: Self-pay | Admitting: Vascular Surgery

## 2011-10-01 ENCOUNTER — Ambulatory Visit
Admission: RE | Admit: 2011-10-01 | Discharge: 2011-10-01 | Disposition: A | Discharge status: Home / Self Care | Payer: Medicare Other | Source: Ambulatory Visit | Attending: Vascular Surgery | Admitting: Vascular Surgery

## 2011-10-01 ENCOUNTER — Ambulatory Visit: Payer: Medicare Other | Admitting: Vascular Surgery

## 2011-10-01 VITALS — BP 90/61 | HR 66 | Resp 16 | Ht 71.5 in | Wt 166.6 lb

## 2011-10-01 VITALS — BP 110/62 | HR 64 | Ht 71.5 in | Wt 165.8 lb

## 2011-10-01 DIAGNOSIS — I712 Thoracic aortic aneurysm, without rupture: Secondary | ICD-10-CM

## 2011-10-01 DIAGNOSIS — Z7901 Long term (current) use of anticoagulants: Secondary | ICD-10-CM

## 2011-10-01 DIAGNOSIS — I4891 Unspecified atrial fibrillation: Secondary | ICD-10-CM

## 2011-10-01 DIAGNOSIS — I2510 Atherosclerotic heart disease of native coronary artery without angina pectoris: Secondary | ICD-10-CM

## 2011-10-01 DIAGNOSIS — T82110A Breakdown (mechanical) of cardiac electrode, initial encounter: Secondary | ICD-10-CM | POA: Insufficient documentation

## 2011-10-01 LAB — ICD DEVICE OBSERVATION
RV LEAD AMPLITUDE: 3.8 mv
RV LEAD IMPEDENCE ICD: 384 Ohm
TZAT-0001SLOWVT: 1
TZAT-0001SLOWVT: 2
TZAT-0004SLOWVT: 8
TZAT-0004SLOWVT: 8
TZAT-0005FASTVT: 88 pct
TZAT-0005SLOWVT: 88 pct
TZAT-0005SLOWVT: 91 pct
TZAT-0011FASTVT: 10 ms
TZAT-0011SLOWVT: 10 ms
TZAT-0011SLOWVT: 10 ms
TZAT-0012FASTVT: 200 ms
TZAT-0012SLOWVT: 200 ms
TZAT-0013SLOWVT: 2
TZAT-0013SLOWVT: 2
TZAT-0019FASTVT: 8 V
TZON-0003FASTVT: 240 ms
TZON-0003SLOWVT: 330 ms
TZON-0004SLOWVT: 40
TZON-0005SLOWVT: 12
TZON-0008FASTVT: 0 ms
TZON-0011AFLUTTER: 70
TZST-0001FASTVT: 3
TZST-0001FASTVT: 5
TZST-0001SLOWVT: 3
TZST-0001SLOWVT: 4
TZST-0001SLOWVT: 6
TZST-0003FASTVT: 20 J
TZST-0003FASTVT: 35 J
TZST-0003FASTVT: 35 J
TZST-0003SLOWVT: 35 J
TZST-0003SLOWVT: 35 J
VENTRICULAR PACING ICD: 0.7 pct

## 2011-10-01 MED ORDER — IOHEXOL 350 MG/ML SOLN
100.0000 mL | Freq: Once | INTRAVENOUS | Status: AC | PRN
  Administered 2011-10-01: 100 mL via INTRAVENOUS

## 2011-10-01 NOTE — Assessment & Plan Note (Signed)
He currently denies anginal symptoms but admits to being very sedentary. He will continue his current medical therapy.

## 2011-10-01 NOTE — Patient Instructions (Signed)
Dr Ladona Ridgel is going to talk with Dr Riley Kill and we will call with follow up

## 2011-10-01 NOTE — Progress Notes (Signed)
HPI [name] returns today for followup. He is a very pleasant 76 year old man with multiple medical problems including an ischemic cardiomyopathy, chronic class II congestive heart failure, status post ICD implantation. The patient has an abdominal aortic aneurysm and a healed ruptured thoracic aneurysm. He had a broken 6949 Med[employer]onic defibrillator lead approximately 3 years ago and underwent insertion of a rate sensing lead at that time. He has done well but recently was noted to have an elevation in the impedance on the shocking portion of his 6949-lead. He returns today for evaluation and discussion about whether or not to place a new defibrillator lead. Allergies  Allergen Reactions  . Atorvastatin     REACTION: muscle ache in legs  . Crestor (Rosuvastatin Calcium) Other (See Comments)    Lower ext fatigue and soreness- if taken everyday  . Lisinopril Diarrhea  . Morphine     REACTION: AMS - agitation  . Prednisone     REACTION: Can't breath  . Zolpidem Tartrate     REACTION: AMS     Current Outpatient Prescriptions  Medication Sig Dispense Refill  . acetaminophen (TYLENOL) 325 MG tablet Take 325 mg by mouth every 6 (six) hours as needed. For pain      . albuterol (PROVENTIL) (2.5 MG/3ML) 0.083% nebulizer solution Take 2.5 mg by nebulization every 6 (six) hours as needed. For shortness of breath      . amiodarone (PACERONE) 200 MG tablet Take 200 mg by mouth daily.       Marland Kitchen aspirin 81 MG tablet Take 81 mg by mouth daily.        Marland Kitchen BENICAR 20 MG tablet TAKE ONE-HALF TABLET BY MOUTH DAILY  30 tablet  3  . budesonide (PULMICORT) 0.25 MG/2ML nebulizer solution Take 2 mLs (0.25 mg total) by nebulization 2 (two) times daily.  60 mL  5  . budesonide (RHINOCORT AQUA) 32 MCG/ACT nasal spray Place 2 sprays into the nose daily.  1 Bottle  12  . carvedilol (COREG) 3.125 MG tablet Take 1 tablet (3.125 mg total) by mouth 2 (two) times daily.  60 tablet  11  . cetirizine (ZYRTEC) 10 MG tablet  Take 10 mg by mouth daily.        . clotrimazole-betamethasone (LOTRISONE) cream Apply 1 application topically 2 (two) times daily as needed. For skin irritation      . cyanocobalamin 1000 MCG/ML injection Inject into the muscle every 30 (thirty) days.        Marland Kitchen desoximetasone (TOPICORT) 0.25 % cream Apply 1 application topically 2 (two) times daily as needed. For rash      . esomeprazole (NEXIUM) 40 MG capsule Take 1 capsule (40 mg total) by mouth daily.  30 capsule  11  . finasteride (PROSCAR) 5 MG tablet Take 5 mg by mouth daily.        . furosemide (LASIX) 40 MG tablet Take 1 tablet (40 mg total) by mouth daily.  30 tablet  3  . guaiFENesin (MUCINEX) 600 MG 12 hr tablet Take 1,200 mg by mouth 2 (two) times daily as needed. For congestion      . guaiFENesin-dextromethorphan (ROBITUSSIN DM) 100-10 MG/5ML syrup Take 5 mLs by mouth 3 (three) times daily as needed. As needed for cough      . hydrOXYzine (ATARAX/VISTARIL) 10 MG tablet 1 tab po qhs prn insomnia  30 tablet  3  . isosorbide mononitrate (IMDUR) 60 MG 24 hr tablet Take 1 tablet (60 mg total) by mouth every  morning.  30 tablet  11  . KLOR-CON 10 10 MEQ tablet TAKE 1 TABLET BY MOUTH EVERY DAY  30 tablet  6  . levalbuterol (XOPENEX HFA) 45 MCG/ACT inhaler Inhale 1-2 puffs into the lungs every 4 (four) hours as needed. For shortness of breath  1 Inhaler  6  . levothyroxine (SYNTHROID, LEVOTHROID) 125 MCG tablet Take 125 mcg by mouth daily. Take a whole tablet Monday, Wednesday, Fridays. Take a a 1/2 tablet on Sundays, Tuesdays, Thursdays, and Saturdays.      . nitroGLYCERIN (NITROSTAT) 0.3 MG SL tablet Place 0.3 mg under the tongue every 5 (five) minutes as needed. For chest pain      . NON FORMULARY 2 Act daily. Flutter Valve.       . NON FORMULARY as directed. Home nebulizer.       . rosuvastatin (CRESTOR) 5 MG tablet Take 5 mg by mouth once a week. Sundays      . sitaGLIPtin (JANUVIA) 100 MG tablet Take 1 tablet (100 mg total) by mouth  daily.  30 tablet  3  . Tamsulosin HCl (FLOMAX) 0.4 MG CAPS Take 0.4 mg by mouth daily.        Marland Kitchen tiotropium (SPIRIVA) 18 MCG inhalation capsule Place 1 capsule (18 mcg total) into inhaler and inhale daily.  30 capsule  5  . warfarin (COUMADIN) 5 MG tablet TAKE AS DIRECTED PER COUMADIN CLINIC  30 tablet  3  . DISCONTD: levothyroxine (SYNTHROID, LEVOTHROID) 125 MCG tablet Alternate 1 tab qd with 1/2 tab qd  23 tablet  3  . ipratropium (ATROVENT) 0.03 % nasal spray Two sprays three times a day as needed before meals  30 mL  1  . levalbuterol (XOPENEX HFA) 45 MCG/ACT inhaler Inhale 1-2 puffs into the lungs every 6 (six) hours as needed.  1 Inhaler  6  . lithium carbonate (LITHOBID) 300 MG CR tablet Take 1 tablet (300 mg total) by mouth 2 (two) times daily.  60 tablet  6  . tiotropium (SPIRIVA HANDIHALER) 18 MCG inhalation capsule Place 1 capsule (18 mcg total) into inhaler and inhale daily.  30 capsule  3  . DISCONTD: rosuvastatin (CRESTOR) 5 MG tablet Take 1 tablet (5 mg total) by mouth at bedtime.  100 tablet  3   No current facility-administered medications for this visit.   Facility-Administered Medications Ordered in Other Visits  Medication Dose Route Frequency Provider Last Rate Last Dose  . iohexol (OMNIPAQUE) 350 MG/ML injection 100 mL  100 mL Intravenous Once PRN Medication Radiologist, MD   100 mL at 10/01/11 1249     Past Medical History  Diagnosis Date  . CAD (coronary artery disease)     post CABG with prior percutaneous intervention of teh saphenous vein graft with known saphenous vein graft disease.  Marland Kitchen AAA (abdominal aortic aneurysm) 2010    5.8cm  . Aneurysm of thoracic aorta 1995    Rupture 1995 w/spontaneous resolution  . Popliteal artery aneurysm   . Myocardial infarction   . Ischemic cardiomyopathy   . Paroxysmal atrial fibrillation   . Hypertension   . Peripheral vascular disease   . Dyslipidemia   . Diabetes mellitus 2007    dx'd 2007 w/persisting FBS>125  .  GERD (gastroesophageal reflux disease)   . Hiatal hernia   . Diverticular disease   . BPH (benign prostatic hypertrophy)     with hx of acute urinary retention (pt can self-cath)  . COPD (chronic obstructive pulmonary disease)  05/09/08 PFT FEV1 1.84 (55%), FVC 4.42 (95%), TLC 6.46 (92%), DLCO 72%, no BD response  . Pneumonia 1/09  . Allergic rhinitis   . Shingles     X 2 episodes, both in left scapula area  . OSA (obstructive sleep apnea) 06/14/09    PSG RDI 17, PLMI 96, intolerant of CPAP or BPAP  . Chronic renal insufficiency     Baseline Cr 1.5  . Prostate cancer 07/2008    Low grade; watchful waiting with [name]  . Pulmonary nodule, left 03/02/2010    Left base; resolved on f/u CT  . Allergic rhinitis   . Carpal tunnel syndrome of left wrist 02/11/2011  . IMPLANTATION OF DEFIBRILLATOR, HX OF 09/02/2008  . IRON DEFICIENCY 06/08/2010    Hemoccult negative:  GI ([name]) recommended no invasive diagnostics--watchful waiting (2012).  . UNSPECIFIED ANEMIA 06/01/2010    Multifactorial: iron def, CRI, vit B12 def,     ROS:   All systems reviewed and negative except as noted in the HPI.   Past Surgical History  Procedure Date  . Cardiac defibrillator placement 08/28/2004    [name]  . Colon surgery 1995    partial colectomy  . Coronary artery bypass graft 1996  . Aorta - bilateral femoral artery bypass graft 1999    [name] Early  . Nose surgery   . Insertion of pacing lead     New rate sensing pacing lead with removal of a previous implanted ICD and insertion of a device back in the pocket with defibrillation threshold testing  . Abdominal aortic aneurysm repair 1999    Ingrarenal repair/ graft  . Stents     Placed to the saphenous vein graft  . Radiofrequency ablation 2006    A flutter     Family History  Problem Relation Age of Onset  . Stroke Mother 29  . Heart attack Mother     CVA  . Cancer Father 62    Lung  . Heart attack Father 37    . Lung cancer Father   . Coronary artery disease Other     2 of 5 siblings with CAD     History   Social History  . Marital Status: Married    Spouse Name: N/A    Number of Children: N/A  . Years of Education: N/A   Occupational History  . RETIRED     Mechanic   Social History Main Topics  . Smoking status: Former Smoker -- 1.0 packs/day for 30 years    Types: Cigarettes    Quit date: 05/23/2003  . Smokeless tobacco: Former Neurosurgeon    Types: Chew    Quit date: 05/14/2003   Comment: 80 pack a year history  . Alcohol Use: 0.6 oz/week    1 Cans of beer per week     only occ  . Drug Use: No  . Sexually Active: Not on file   Other Topics Concern  . Not on file   Social History Narrative   Lives in [place] with his wife.Very involved in his church.Enjoys bluegrass music, going to [employer], spending time with extended family.Used to play golf twice a week up until 2012.No alc/drug use.Former smoker, quit 2005.     BP 110/62  Pulse 64  Ht 5' 11.5" (1.816 m)  Wt 165 lb 12.8 oz (75.206 kg)  BMI 22.80 kg/m2  Physical Exam:  Well appearing 76 year old man, NAD HEENT: Unremarkable Neck:  No JVD, no thyromegally  Lungs:  Clear with no wheezes, rales, or rhonchi. HEART:  Regular rate rhythm, no rubs, no clicks 2/6 systolic murmur is present. Abd:  soft, positive bowel sounds, no organomegally, no rebound, no guarding Ext:  2 plus pulses, no edema, no cyanosis, no clubbing Skin:  No rashes no nodules Neuro:  CN II through XII intact, motor grossly intact  DEVICE  See PaceArt for details. There is an elevation of the SVC coil impedance.  Assess/Plan:

## 2011-10-01 NOTE — Assessment & Plan Note (Signed)
His symptoms appear to be well-controlled. He will continue his current medical therapy. 

## 2011-10-01 NOTE — Assessment & Plan Note (Signed)
The patient has developed a failure of the shocking coil of his defibrillator lead. Today we discussed the treatment options. 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 Revision of the lead would require an extraction as well as insertion of a new lead. The patient is a had a defibrillator for 7 years and received no therapy. He has multiple additional problems. In the short term I am inclined not to recommend an extensive extraction and revision type of procedure. I would like to discuss these issues with his primary cardiologist in the next several weeks. We'll make additional recommendations based on those discussions. For now therapies for his device had been turned off.

## 2011-10-01 NOTE — Progress Notes (Signed)
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 He is status post open infrarenal abdominal aortic repair with bifurcated graft by myself in 2000. He is in his usual health. He is having a generalized decline. He is alert and oriented but is unable to do much walking due to generalized weakness and shortness of breath. He does report no new major cardiac difficulties. He saw Dr. Ladona Ridgel with electrophysiology this morning apparently is having some dysfunction and his defibrillator. He reports that this is never fired. He does report some chest tightness and shortness of breath. He has no symptoms referable to his aorta.  Past Medical History  Diagnosis Date  . CAD (coronary artery disease)     post CABG with prior percutaneous intervention of teh saphenous vein graft with known saphenous vein graft disease.  Marland Kitchen AAA (abdominal aortic aneurysm) 2010    5.8cm  . Aneurysm of thoracic aorta 1995    Rupture 1995 w/spontaneous resolution  . Popliteal artery aneurysm   . Myocardial infarction   . Ischemic cardiomyopathy   . Paroxysmal atrial fibrillation   . Hypertension   . Peripheral vascular disease   . Dyslipidemia   . Diabetes mellitus 2007    dx'd 2007 w/persisting FBS>125  . GERD (gastroesophageal reflux disease)   . Hiatal hernia   . Diverticular disease   . BPH (benign prostatic hypertrophy)     with hx of acute urinary retention (pt can self-cath)  . COPD (chronic obstructive pulmonary disease)     05/09/08 PFT FEV1 1.84 (55%), FVC 4.42 (95%), TLC 6.46 (92%), DLCO 72%, no BD response  . Pneumonia 1/09  . Allergic rhinitis   . Shingles     X 2 episodes, both in left scapula area  . OSA (obstructive sleep apnea) 06/14/09    PSG RDI 17, PLMI 96, intolerant of CPAP or BPAP  . Chronic renal insufficiency     Baseline Cr 1.5  . Prostate cancer 07/2008    Low grade; watchful waiting with [name]  . Pulmonary nodule, left 03/02/2010    Left base;  resolved on f/u CT  . Allergic rhinitis   . Carpal tunnel syndrome of left wrist 02/11/2011  . IMPLANTATION OF DEFIBRILLATOR, HX OF 09/02/2008  . IRON DEFICIENCY 06/08/2010    Hemoccult negative:  GI ([name]) recommended no invasive diagnostics--watchful waiting (2012).  . UNSPECIFIED ANEMIA 06/01/2010    Multifactorial: iron def, CRI, vit B12 def,     History  Substance Use Topics  . Smoking status: Former Smoker -- 1.0 packs/day for 30 years    Types: Cigarettes    Quit date: 05/23/2003  . Smokeless tobacco: Former Neurosurgeon    Types: Chew    Quit date: 05/14/2003   Comment: 80 pack a year history  . Alcohol Use: 0.6 oz/week    1 Cans of beer per week     only occ    Family History  Problem Relation Age of Onset  . Stroke Mother 62  . Heart attack Mother     CVA  . Cancer Father 19    Lung  . Heart attack Father 40  . Lung cancer Father   . Coronary artery disease Other     2 of 5 siblings with CAD    Allergies  Allergen Reactions  . Atorvastatin     REACTION: muscle ache in legs  . Crestor (Rosuvastatin Calcium) Other (See Comments)    Lower ext fatigue and soreness- if taken  everyday  . Lisinopril Diarrhea  . Morphine     REACTION: AMS - agitation  . Prednisone     REACTION: Can't breath  . Zolpidem Tartrate     REACTION: AMS    Current outpatient prescriptions:acetaminophen (TYLENOL) 325 MG tablet, Take 325 mg by mouth every 6 (six) hours as needed. For pain, Disp: , Rfl: ;  albuterol (PROVENTIL) (2.5 MG/3ML) 0.083% nebulizer solution, Take 2.5 mg by nebulization every 6 (six) hours as needed. For shortness of breath, Disp: , Rfl: ;  amiodarone (PACERONE) 200 MG tablet, Take 200 mg by mouth daily. , Disp: , Rfl:  aspirin 81 MG tablet, Take 81 mg by mouth daily.  , Disp: , Rfl: ;  BENICAR 20 MG tablet, TAKE ONE-HALF TABLET BY MOUTH DAILY, Disp: 30 tablet, Rfl: 3;  budesonide (PULMICORT) 0.25 MG/2ML nebulizer solution, Take 2 mLs (0.25 mg total) by nebulization 2  (two) times daily., Disp: 60 mL, Rfl: 5;  budesonide (RHINOCORT AQUA) 32 MCG/ACT nasal spray, Place 2 sprays into the nose daily., Disp: 1 Bottle, Rfl: 12 carvedilol (COREG) 3.125 MG tablet, Take 1 tablet (3.125 mg total) by mouth 2 (two) times daily., Disp: 60 tablet, Rfl: 11;  cetirizine (ZYRTEC) 10 MG tablet, Take 10 mg by mouth daily.  , Disp: , Rfl: ;  clotrimazole-betamethasone (LOTRISONE) cream, Apply 1 application topically 2 (two) times daily as needed. For skin irritation, Disp: , Rfl: ;  cyanocobalamin 1000 MCG/ML injection, Inject into the muscle every 30 (thirty) days.  , Disp: , Rfl:  desoximetasone (TOPICORT) 0.25 % cream, Apply 1 application topically 2 (two) times daily as needed. For rash, Disp: , Rfl: ;  esomeprazole (NEXIUM) 40 MG capsule, Take 1 capsule (40 mg total) by mouth daily., Disp: 30 capsule, Rfl: 11;  finasteride (PROSCAR) 5 MG tablet, Take 5 mg by mouth daily.  , Disp: , Rfl: ;  furosemide (LASIX) 40 MG tablet, Take 1 tablet (40 mg total) by mouth daily., Disp: 30 tablet, Rfl: 3 guaiFENesin (MUCINEX) 600 MG 12 hr tablet, Take 1,200 mg by mouth 2 (two) times daily as needed. For congestion, Disp: , Rfl: ;  guaiFENesin-dextromethorphan (ROBITUSSIN DM) 100-10 MG/5ML syrup, Take 5 mLs by mouth 3 (three) times daily as needed. As needed for cough, Disp: , Rfl: ;  hydrOXYzine (ATARAX/VISTARIL) 10 MG tablet, 1 tab po qhs prn insomnia, Disp: 30 tablet, Rfl: 3 isosorbide mononitrate (IMDUR) 60 MG 24 hr tablet, Take 1 tablet (60 mg total) by mouth every morning., Disp: 30 tablet, Rfl: 11;  KLOR-CON 10 10 MEQ tablet, TAKE 1 TABLET BY MOUTH EVERY DAY, Disp: 30 tablet, Rfl: 6;  levalbuterol (XOPENEX HFA) 45 MCG/ACT inhaler, Inhale 1-2 puffs into the lungs every 4 (four) hours as needed. For shortness of breath, Disp: 1 Inhaler, Rfl: 6 levothyroxine (SYNTHROID, LEVOTHROID) 125 MCG tablet, Take 125 mcg by mouth daily. Take a whole tablet Monday, Wednesday, Fridays. Take a a 1/2 tablet on  Sundays, Tuesdays, Thursdays, and Saturdays., Disp: , Rfl: ;  nitroGLYCERIN (NITROSTAT) 0.3 MG SL tablet, Place 0.3 mg under the tongue every 5 (five) minutes as needed. For chest pain, Disp: , Rfl: ;  NON FORMULARY, 2 Act daily. Flutter Valve. , Disp: , Rfl:  NON FORMULARY, as directed. Home nebulizer. , Disp: , Rfl: ;  rosuvastatin (CRESTOR) 5 MG tablet, Take 5 mg by mouth once a week. Sundays, Disp: , Rfl: ;  sitaGLIPtin (JANUVIA) 100 MG tablet, Take 1 tablet (100 mg total) by mouth daily., Disp: 30  tablet, Rfl: 3;  Tamsulosin HCl (FLOMAX) 0.4 MG CAPS, Take 0.4 mg by mouth daily.  , Disp: , Rfl:  tiotropium (SPIRIVA) 18 MCG inhalation capsule, Place 1 capsule (18 mcg total) into inhaler and inhale daily., Disp: 30 capsule, Rfl: 5;  warfarin (COUMADIN) 5 MG tablet, TAKE AS DIRECTED PER COUMADIN CLINIC, Disp: 30 tablet, Rfl: 3;  ipratropium (ATROVENT) 0.03 % nasal spray, Two sprays three times a day as needed before meals, Disp: 30 mL, Rfl: 1 levalbuterol (XOPENEX HFA) 45 MCG/ACT inhaler, Inhale 1-2 puffs into the lungs every 6 (six) hours as needed., Disp: 1 Inhaler, Rfl: 6;  lithium carbonate (LITHOBID) 300 MG CR tablet, Take 1 tablet (300 mg total) by mouth 2 (two) times daily., Disp: 60 tablet, Rfl: 6;  tiotropium (SPIRIVA HANDIHALER) 18 MCG inhalation capsule, Place 1 capsule (18 mcg total) into inhaler and inhale daily., Disp: 30 capsule, Rfl: 3 DISCONTD: levothyroxine (SYNTHROID, LEVOTHROID) 125 MCG tablet, Alternate 1 tab qd with 1/2 tab qd, Disp: 23 tablet, Rfl: 3;  DISCONTD: rosuvastatin (CRESTOR) 5 MG tablet, Take 1 tablet (5 mg total) by mouth at bedtime., Disp: 100 tablet, Rfl: 3 No current facility-administered medications for this visit. Facility-Administered Medications Ordered in Other Visits: iohexol (OMNIPAQUE) 350 MG/ML injection 100 mL, 100 mL, Intravenous, Once PRN, Medication Radiologist, MD, 100 mL at 10/01/11 1249  BP 90/61  Pulse 66  Resp 16  Ht 5' 11.5" (1.816 m)  Wt 166 lb  9.6 oz (75.569 kg)  BMI 22.91 kg/m2  SpO2 96%  Body mass index is 22.91 kg/(m^2).       Physical exam alert oriented white male in no acute distress Chest clear bilaterally Articular regular rate and rhythm Abdomen soft moderately tender diffusely. Well-healed midline incision Femoral pulses 2+ bilaterally Neurologically he is grossly intact Skin ulcers or rashes  CT scan and chest and abdomen revealed continued increase in size in the thoracoabdominal aneurysm is greater than 5 mm since his scan one year ago. In his upper thorax this is now 6.8 cm and is also enlarged above his old infrarenal graft repair.  Impression and plan: Continued expansion the thoracoabdominal aneurysm above infrarenal open repair in 2000. I discussed this at length with the patient and his wife present. I explained the significant morbidity and mortality associated with thoracoabdominal aneurysm repair. I will discuss this case with Dr. Bennie Pierini and [facility] with potential referral for endovascular versus open options. We will coordinate this and arrange this for the patient

## 2011-10-02 ENCOUNTER — Telehealth: Payer: Self-pay | Admitting: Family Medicine

## 2011-10-02 NOTE — Telephone Encounter (Signed)
FYI

## 2011-10-02 NOTE — Telephone Encounter (Signed)
Noted-PM 

## 2011-10-09 ENCOUNTER — Ambulatory Visit (INDEPENDENT_AMBULATORY_CARE_PROVIDER_SITE_OTHER): Payer: Medicare Other | Admitting: Family Medicine

## 2011-10-09 ENCOUNTER — Encounter: Payer: Self-pay | Admitting: Family Medicine

## 2011-10-09 VITALS — BP 105/58 | HR 62 | Temp 97.6°F | Ht 70.5 in | Wt 164.0 lb

## 2011-10-09 DIAGNOSIS — T82110A Breakdown (mechanical) of cardiac electrode, initial encounter: Secondary | ICD-10-CM

## 2011-10-09 DIAGNOSIS — E538 Deficiency of other specified B group vitamins: Secondary | ICD-10-CM

## 2011-10-09 DIAGNOSIS — R5383 Other fatigue: Secondary | ICD-10-CM

## 2011-10-09 DIAGNOSIS — E119 Type 2 diabetes mellitus without complications: Secondary | ICD-10-CM

## 2011-10-09 DIAGNOSIS — J449 Chronic obstructive pulmonary disease, unspecified: Secondary | ICD-10-CM

## 2011-10-09 DIAGNOSIS — J4489 Other specified chronic obstructive pulmonary disease: Secondary | ICD-10-CM

## 2011-10-09 DIAGNOSIS — T82198A Other mechanical complication of other cardiac electronic device, initial encounter: Secondary | ICD-10-CM

## 2011-10-09 LAB — POCT URINALYSIS DIPSTICK
Protein, UA: NEGATIVE
Spec Grav, UA: 1.015
Urobilinogen, UA: 0.2

## 2011-10-09 LAB — HEMOGLOBIN A1C
Hgb A1c MFr Bld: 5.9 % — ABNORMAL HIGH (ref <5.7)
Mean Plasma Glucose: 123 mg/dL — ABNORMAL HIGH (ref <117)

## 2011-10-09 LAB — CBC WITH DIFFERENTIAL/PLATELET
Eosinophils Absolute: 0.3 10*3/uL (ref 0.0–0.7)
HCT: 36 % — ABNORMAL LOW (ref 39.0–52.0)
Hemoglobin: 11.8 g/dL — ABNORMAL LOW (ref 13.0–17.0)
Lymphs Abs: 1.3 10*3/uL (ref 0.7–4.0)
MCH: 28.9 pg (ref 26.0–34.0)
MCHC: 32.8 g/dL (ref 30.0–36.0)
Monocytes Absolute: 0.6 10*3/uL (ref 0.1–1.0)
Monocytes Relative: 9 % (ref 3–12)
Neutrophils Relative %: 66 % (ref 43–77)
RBC: 4.08 MIL/uL — ABNORMAL LOW (ref 4.22–5.81)

## 2011-10-09 LAB — COMPREHENSIVE METABOLIC PANEL
AST: 14 U/L (ref 0–37)
Albumin: 4.2 g/dL (ref 3.5–5.2)
Alkaline Phosphatase: 70 U/L (ref 39–117)
Calcium: 9 mg/dL (ref 8.4–10.5)
Chloride: 106 mEq/L (ref 96–112)
Glucose, Bld: 104 mg/dL — ABNORMAL HIGH (ref 70–99)
Potassium: 4 mEq/L (ref 3.5–5.3)
Sodium: 141 mEq/L (ref 135–145)
Total Protein: 6.7 g/dL (ref 6.0–8.3)

## 2011-10-09 MED ORDER — CYANOCOBALAMIN 1000 MCG/ML IJ SOLN
1000.0000 ug | Freq: Once | INTRAMUSCULAR | Status: AC
  Administered 2011-10-09: 1000 ug via INTRAMUSCULAR

## 2011-10-09 MED ORDER — PREDNISONE 20 MG PO TABS: ORAL_TABLET | ORAL | Status: DC

## 2011-10-09 NOTE — Assessment & Plan Note (Signed)
This has been disabled for now, as per cardiologists (due to recent malfunction/alarm).

## 2011-10-09 NOTE — Assessment & Plan Note (Signed)
Problem stable.  Continue current medications and diet appropriate for this condition.  We have reviewed our general long term plan for this problem and also reviewed symptoms and signs that should prompt the patient to call or return to the office. Check HbA1c today. Last A1c was excellent:  Lab Results  Component Value Date   HGBA1C 5.9* 03/29/2011

## 2011-10-09 NOTE — Progress Notes (Signed)
OFFICE VISIT  10/09/2011   CC:  Chief Complaint  Patient presents with  . Follow-up    not feeling well, has seen cardiology and vascular MD     HPI:    Patient is a 76 y.o. Caucasian male who presents for " just not feeling well", "give out easily" when he gets up to do anything.  Feels fine when he is resting.  Legs would feel tired and he'd rest just for a minute and feel like he could keep going again.  Some dizziness lately, fell in yard 5d/a when he bent over to hook a trailer up on tractor, no syncope.  Denies palpitations or chest pains or nausea.  Leg sx's seem to be present sometimes and then other times not bother him any at all.  No recent URI or significant cough, no fevers.  No HA's.  He denies dysuria, urinary urgency, frequency, or gross hematuria.  Denies abd pain, flank pain, or new back pains.  No rash, no myalgias.  NO swelling of joints, no redness or warmth of joints. He does note that his neb machine tubing was found to be kinked recently and after he fixed this he began feeling better quickly--"I was ready to jumb up and down yesterday and feel almost as good today").  Still with sleep complaints:  Initiates sleep fine, then wakes up to pee and can't get back to sleep.  Describes lots of restlessness and uncomfortable body position but won't admit to "uncomfortable sensation in lower legs and irresistable urge to move legs".  Appetite is fine.   Bowels moving ok with prn milk of mag.  Noted recent probs with ICD alarm going off, not getting shocked though. Cardiology evaluated this and they are still discussing possible fixes.  At this point his defibrillator is disabled. Also noted recent CV surg f/u at which time a CT showed his thoracic aneurism had enlarged 1/2 cm, plan for possible surgery via tertiary care center being discussed. Reviewed past labs from the last 91mo: noted he's due for a few things and will get them today, but overall he's been monitored  appropriately and the only unanswered issue I have is his past hx of hematuria and any urologic f/u over the last 12 mo. Protimes at coumadin clinic have been therapeutic.  Past Medical History  Diagnosis Date  . CAD (coronary artery disease)     post CABG with prior percutaneous intervention of teh saphenous vein graft with known saphenous vein graft disease.  Marland Kitchen AAA (abdominal aortic aneurysm) 2010    5.8cm  . Aneurysm of thoracic aorta 1995    Rupture 1995 w/spontaneous resolution  . Popliteal artery aneurysm   . Myocardial infarction   . Ischemic cardiomyopathy   . Paroxysmal atrial fibrillation   . Hypertension   . Peripheral vascular disease   . Dyslipidemia   . Diabetes mellitus 2007    dx'd 2007 w/persisting FBS>125  . GERD (gastroesophageal reflux disease)   . Hiatal hernia   . Diverticular disease   . BPH (benign prostatic hypertrophy)     with hx of acute urinary retention (pt can self-cath)  . COPD (chronic obstructive pulmonary disease)     05/09/08 PFT FEV1 1.84 (55%), FVC 4.42 (95%), TLC 6.46 (92%), DLCO 72%, no BD response  . Pneumonia 1/09  . Allergic rhinitis   . Shingles     X 2 episodes, both in left scapula area  . OSA (obstructive sleep apnea) 06/14/09    PSG  RDI 17, PLMI 96, intolerant of CPAP or BPAP  . Chronic renal insufficiency     Baseline Cr 1.5  . Prostate cancer 07/2008    Low grade; watchful waiting with [name]  . Pulmonary nodule, left 03/02/2010    Left base; resolved on f/u CT  . Allergic rhinitis   . Carpal tunnel syndrome of left wrist 02/11/2011  . IMPLANTATION OF DEFIBRILLATOR, HX OF 09/02/2008  . IRON DEFICIENCY 06/08/2010    Hemoccult negative:  GI ([name]) recommended no invasive diagnostics--watchful waiting (2012).  . UNSPECIFIED ANEMIA 06/01/2010    Multifactorial: iron def, CRI, vit B12 def,     Past Surgical History  Procedure Date  . Cardiac defibrillator placement 08/28/2004    [name]  . Colon surgery  1995    partial colectomy  . Coronary artery bypass graft 1996  . Aorta - bilateral femoral artery bypass graft 1999    [name] Early  . Nose surgery   . Insertion of pacing lead     New rate sensing pacing lead with removal of a previous implanted ICD and insertion of a device back in the pocket with defibrillation threshold testing  . Abdominal aortic aneurysm repair 1999    Ingrarenal repair/ graft  . Stents     Placed to the saphenous vein graft  . Radiofrequency ablation 2006    A flutter    Outpatient Prescriptions Prior to Visit  Medication Sig Dispense Refill  . acetaminophen (TYLENOL) 325 MG tablet Take 325 mg by mouth every 6 (six) hours as needed. For pain      . albuterol (PROVENTIL) (2.5 MG/3ML) 0.083% nebulizer solution Take 2.5 mg by nebulization every 6 (six) hours as needed. For shortness of breath      . amiodarone (PACERONE) 200 MG tablet Take 200 mg by mouth daily.       Marland Kitchen aspirin 81 MG tablet Take 81 mg by mouth daily.        Marland Kitchen BENICAR 20 MG tablet TAKE ONE-HALF TABLET BY MOUTH DAILY  30 tablet  3  . budesonide (PULMICORT) 0.25 MG/2ML nebulizer solution Take 2 mLs (0.25 mg total) by nebulization 2 (two) times daily.  60 mL  5  . budesonide (RHINOCORT AQUA) 32 MCG/ACT nasal spray Place 2 sprays into the nose daily.  1 Bottle  12  . carvedilol (COREG) 3.125 MG tablet Take 1 tablet (3.125 mg total) by mouth 2 (two) times daily.  60 tablet  11  . cetirizine (ZYRTEC) 10 MG tablet Take 10 mg by mouth daily.        . clotrimazole-betamethasone (LOTRISONE) cream Apply 1 application topically 2 (two) times daily as needed. For skin irritation      . cyanocobalamin 1000 MCG/ML injection Inject into the muscle every 30 (thirty) days.        Marland Kitchen desoximetasone (TOPICORT) 0.25 % cream Apply 1 application topically 2 (two) times daily as needed. For rash      . esomeprazole (NEXIUM) 40 MG capsule Take 1 capsule (40 mg total) by mouth daily.  30 capsule  11  . finasteride  (PROSCAR) 5 MG tablet Take 5 mg by mouth daily.        . furosemide (LASIX) 40 MG tablet Take 1 tablet (40 mg total) by mouth daily.  30 tablet  3  . guaiFENesin (MUCINEX) 600 MG 12 hr tablet Take 1,200 mg by mouth 2 (two) times daily as needed. For congestion      .  guaiFENesin-dextromethorphan (ROBITUSSIN DM) 100-10 MG/5ML syrup Take 5 mLs by mouth 3 (three) times daily as needed. As needed for cough      . hydrOXYzine (ATARAX/VISTARIL) 10 MG tablet 1 tab po qhs prn insomnia  30 tablet  3  . isosorbide mononitrate (IMDUR) 60 MG 24 hr tablet Take 1 tablet (60 mg total) by mouth every morning.  30 tablet  11  . KLOR-CON 10 10 MEQ tablet TAKE 1 TABLET BY MOUTH EVERY DAY  30 tablet  6  . levalbuterol (XOPENEX HFA) 45 MCG/ACT inhaler Inhale 1-2 puffs into the lungs every 4 (four) hours as needed. For shortness of breath  1 Inhaler  6  . levothyroxine (SYNTHROID, LEVOTHROID) 125 MCG tablet Take 125 mcg by mouth daily. Take a whole tablet Monday, Wednesday, Fridays. Take a a 1/2 tablet on Sundays, Tuesdays, Thursdays, and Saturdays.      . nitroGLYCERIN (NITROSTAT) 0.3 MG SL tablet Place 0.3 mg under the tongue every 5 (five) minutes as needed. For chest pain      . NON FORMULARY 2 Act daily. Flutter Valve.       . NON FORMULARY as directed. Home nebulizer.       . rosuvastatin (CRESTOR) 5 MG tablet Take 5 mg by mouth once a week. Sundays      . sitaGLIPtin (JANUVIA) 100 MG tablet Take 1 tablet (100 mg total) by mouth daily.  30 tablet  3  . Tamsulosin HCl (FLOMAX) 0.4 MG CAPS Take 0.4 mg by mouth daily.        Marland Kitchen tiotropium (SPIRIVA) 18 MCG inhalation capsule Place 1 capsule (18 mcg total) into inhaler and inhale daily.  30 capsule  5  . warfarin (COUMADIN) 5 MG tablet TAKE AS DIRECTED PER COUMADIN CLINIC  30 tablet  3  . ipratropium (ATROVENT) 0.03 % nasal spray Two sprays three times a day as needed before meals  30 mL  1  . levalbuterol (XOPENEX HFA) 45 MCG/ACT inhaler Inhale 1-2 puffs into the  lungs every 6 (six) hours as needed.  1 Inhaler  6  . lithium carbonate (LITHOBID) 300 MG CR tablet Take 1 tablet (300 mg total) by mouth 2 (two) times daily.  60 tablet  6  . tiotropium (SPIRIVA HANDIHALER) 18 MCG inhalation capsule Place 1 capsule (18 mcg total) into inhaler and inhale daily.  30 capsule  3   No facility-administered medications prior to visit.    Allergies  Allergen Reactions  . Atorvastatin     REACTION: muscle ache in legs  . Crestor (Rosuvastatin Calcium) Other (See Comments)    Lower ext fatigue and soreness- if taken everyday  . Lisinopril Diarrhea  . Morphine     REACTION: AMS - agitation  . Prednisone     REACTION: Can't breath  . Zolpidem Tartrate     REACTION: AMS    ROS As per HPI  PE: Blood pressure 105/58, pulse 62, temperature 97.6 F (36.4 C), temperature source Temporal, height 5' 10.5" (1.791 m), weight 164 lb (74.39 kg), SpO2 96.00%. RA 02 sat 94-96%.  With 3 circles of walking around our office his pulse ox never got lower than 92% and was >95% most of the time.  After ambulation, his HR was 62 and BP was 105/58.  Gen: Alert, well appearing.  Patient is oriented to person, place, time, and situation. ENT: Ears: EACs clear, normal epithelium.  TMs with good light reflex and landmarks bilaterally.  Eyes: no injection, icteris, swelling, or  exudate.  EOMI, PERRLA. Nose: no drainage or turbinate edema/swelling.  No injection or focal lesion.  Mouth: lips without lesion/swelling.  Oral mucosa pink and moist.  Dentition intact and without obvious caries or gingival swelling.  Oropharynx without erythema, exudate, or swelling.  Neck - No masses or thyromegaly or limitation in range of motion CV: RRR, 1/6 systolic murmur at LSB and apex.  No rub or gallop.    LUNGS: scattered insp/exp rhonchi that do not clear with coughing, trace exp wheezing,nonlabored resps, good aeration in all lung fields. EXT: trace-1+ pitting in lower ext's bilat.  NO cyanosis  or clubbing.  LABS:  CC UA today showed trace intact blood, otherwise normal.  IMPRESSION AND PLAN:  Fatigue Strictly with ambulation.   Will check a few basic labs, sent his urine for culture and basically treat him for mild COPD exac: prednisone 20mg  qd x 5d. He is on very low dose beta blocker but I wonder if lowering it more would allow for better HR adaptation to ambulation/movement/exertion, and thereby relieve some of these ambulation-related symptoms.  I have low suspicion of cardiac ischemia or dysrhythmia as the culprit for the way he's been feeling lately. Will run any cardiac med changes by Dr. Riley Kill if I'm considering making any such changes. I feel like he also has some waxing/waning of his chronic medical problems and polypharmacy that play a role in the way he feels lately.   DIABETES MELLITUS, TYPE II Problem stable.  Continue current medications and diet appropriate for this condition.  We have reviewed our general long term plan for this problem and also reviewed symptoms and signs that should prompt the patient to call or return to the office. Check HbA1c today. Last A1c was excellent:  Lab Results  Component Value Date   HGBA1C 5.9* 03/29/2011     COPD Possible mild exacerbation--possibly inadvertent noncompliance with nebs lately could have led to some of this. Pred 20mg  qd x 5d. Oxygenation at rest and with ambulation are great today.  Will arrange for overnight oximetry through [facility] resp therapy.  ICD (implantable cardiac defibrillator) lead failure This has been disabled for now, as per cardiologists (due to recent malfunction/alarm).   Spent 1 hr with pt today and >50% of this time was spent counseling pt on his chronic medical issues and coordination of care for his COPD and other chronic illnesses.  FOLLOW UP: Return in about 2 weeks (around 10/23/2011).

## 2011-10-09 NOTE — Assessment & Plan Note (Signed)
Possible mild exacerbation--possibly inadvertent noncompliance with nebs lately could have led to some of this. Pred 20mg  qd x 5d. Oxygenation at rest and with ambulation are great today.  Will arrange for overnight oximetry through [facility] resp therapy.

## 2011-10-09 NOTE — Assessment & Plan Note (Addendum)
Strictly with ambulation.   Will check a few basic labs, sent his urine for culture and basically treat him for mild COPD exac: prednisone 20mg  qd x 5d. He is on very low dose beta blocker but I wonder if lowering it more would allow for better HR adaptation to ambulation/movement/exertion, and thereby relieve some of these ambulation-related symptoms.  I have low suspicion of cardiac ischemia or dysrhythmia as the culprit for the way he's been feeling lately. Will run any cardiac med changes by Dr. Riley Kill if I'm considering making any such changes. I feel like he also has some waxing/waning of his chronic medical problems and polypharmacy that play a role in the way he feels lately.

## 2011-10-16 ENCOUNTER — Telehealth: Payer: Self-pay | Admitting: Family Medicine

## 2011-10-16 NOTE — Telephone Encounter (Signed)
Talked to pt on phone today, notified him of his normal overnight oximetry study, reassured him. He reports feeling better, tolerating the prednisone I gave last visit.  He actually played 2 holes of golf yesterday for the first time in 2 yrs!  Currently he is out in his yard working some today.  --PM

## 2011-10-23 ENCOUNTER — Ambulatory Visit: Payer: Medicare Other | Admitting: Family Medicine

## 2011-10-24 ENCOUNTER — Telehealth: Payer: Self-pay | Admitting: Vascular Surgery

## 2011-10-24 NOTE — Telephone Encounter (Signed)
Per Olegario Messier in medical records at [facility] Dr.Farbar's office requested a cd of a recent CTA performed at [facility] imaging on 10/01/11. Dr. Arbie Cookey saw pt on 10/01/11 as well and is referring pt to [facility] and Vascular.  I spoke w/ Charlie @ [facility] and she will mail the cd to their office in [place]. I notified Elane Fritz regarding this. Jacklyn Shell

## 2011-10-25 ENCOUNTER — Ambulatory Visit (INDEPENDENT_AMBULATORY_CARE_PROVIDER_SITE_OTHER): Payer: Medicare Other | Admitting: Family Medicine

## 2011-10-25 ENCOUNTER — Encounter: Payer: Self-pay | Admitting: Family Medicine

## 2011-10-25 VITALS — BP 101/62 | HR 56 | Ht 70.5 in | Wt 166.0 lb

## 2011-10-25 DIAGNOSIS — R42 Dizziness and giddiness: Secondary | ICD-10-CM

## 2011-10-25 DIAGNOSIS — J449 Chronic obstructive pulmonary disease, unspecified: Secondary | ICD-10-CM

## 2011-10-25 NOTE — Assessment & Plan Note (Signed)
I feel like he is currently about at his baseline. Conitnue current management.

## 2011-10-25 NOTE — Progress Notes (Signed)
OFFICE NOTE  10/25/2011  CC:  Chief Complaint  Patient presents with  . Follow-up    feels about the same as last visit     HPI: Patient is a 76 y.o. Caucasian male who is here for 2 wk f/u for recent illness that I treated as mild COPD exacerbation.  He feels a bit better, "I'm doing ok".   Says he is worried some by his tendency (for at least a couple of months) to occasionally get lightheaded when going from sitting to standing position.  Has almost fallen on occasion.  He says there is no predictability to the episodes --he may have one in a 3-4 day period and then he may have a day where it happens several times. Denies any recent chest pains.  No recent fevers.  Pertinent PMH:  Past Medical History  Diagnosis Date  . CAD (coronary artery disease)     post CABG with prior percutaneous intervention of teh saphenous vein graft with known saphenous vein graft disease.  Marland Kitchen AAA (abdominal aortic aneurysm) 2010    5.8cm  . Aneurysm of thoracic aorta 1995    Rupture 1995 w/spontaneous resolution  . Popliteal artery aneurysm   . Myocardial infarction   . Ischemic cardiomyopathy   . Paroxysmal atrial fibrillation   . Hypertension   . Peripheral vascular disease   . Dyslipidemia   . Diabetes mellitus 2007    dx'd 2007 w/persisting FBS>125  . GERD (gastroesophageal reflux disease)   . Hiatal hernia   . Diverticular disease   . BPH (benign prostatic hypertrophy)     with hx of acute urinary retention (pt can self-cath)  . COPD (chronic obstructive pulmonary disease)     05/09/08 PFT FEV1 1.84 (55%), FVC 4.42 (95%), TLC 6.46 (92%), DLCO 72%, no BD response  . Pneumonia 1/09  . Allergic rhinitis   . Shingles     X 2 episodes, both in left scapula area  . OSA (obstructive sleep apnea) 06/14/09    PSG RDI 17, PLMI 96, intolerant of CPAP or BPAP  . Chronic renal insufficiency     Baseline Cr 1.5  . Prostate cancer 07/2008    Low grade; watchful waiting with [name]  .  Pulmonary nodule, left 03/02/2010    Left base; resolved on f/u CT  . Allergic rhinitis   . Carpal tunnel syndrome of left wrist 02/11/2011  . IMPLANTATION OF DEFIBRILLATOR, HX OF 09/02/2008  . IRON DEFICIENCY 06/08/2010    Hemoccult negative:  GI ([name]) recommended no invasive diagnostics--watchful waiting (2012).  . UNSPECIFIED ANEMIA 06/01/2010    Multifactorial: iron def, CRI, vit B12 def,    Past surgical, social, and family history reviewed and no changes noted since last office visit.  MEDS:  Outpatient Prescriptions Prior to Visit  Medication Sig Dispense Refill  . acetaminophen (TYLENOL) 325 MG tablet Take 325 mg by mouth every 6 (six) hours as needed. For pain      . albuterol (PROVENTIL) (2.5 MG/3ML) 0.083% nebulizer solution Take 2.5 mg by nebulization every 6 (six) hours as needed. For shortness of breath      . amiodarone (PACERONE) 200 MG tablet Take 200 mg by mouth daily.       Marland Kitchen aspirin 81 MG tablet Take 81 mg by mouth daily.        Marland Kitchen BENICAR 20 MG tablet TAKE ONE-HALF TABLET BY MOUTH DAILY  30 tablet  3  . budesonide (PULMICORT) 0.25 MG/2ML nebulizer solution Take  2 mLs (0.25 mg total) by nebulization 2 (two) times daily.  60 mL  5  . budesonide (RHINOCORT AQUA) 32 MCG/ACT nasal spray Place 2 sprays into the nose daily.  1 Bottle  12  . carvedilol (COREG) 3.125 MG tablet Take 1 tablet (3.125 mg total) by mouth 2 (two) times daily.  60 tablet  11  . cetirizine (ZYRTEC) 10 MG tablet Take 10 mg by mouth daily.        . clotrimazole-betamethasone (LOTRISONE) cream Apply 1 application topically 2 (two) times daily as needed. For skin irritation      . cyanocobalamin 1000 MCG/ML injection Inject into the muscle every 30 (thirty) days.        Marland Kitchen desoximetasone (TOPICORT) 0.25 % cream Apply 1 application topically 2 (two) times daily as needed. For rash      . esomeprazole (NEXIUM) 40 MG capsule Take 1 capsule (40 mg total) by mouth daily.  30 capsule  11  . finasteride  (PROSCAR) 5 MG tablet Take 5 mg by mouth daily.        . furosemide (LASIX) 40 MG tablet Take 1 tablet (40 mg total) by mouth daily.  30 tablet  3  . guaiFENesin (MUCINEX) 600 MG 12 hr tablet Take 1,200 mg by mouth 2 (two) times daily as needed. For congestion      . guaiFENesin-dextromethorphan (ROBITUSSIN DM) 100-10 MG/5ML syrup Take 5 mLs by mouth 3 (three) times daily as needed. As needed for cough      . hydrOXYzine (ATARAX/VISTARIL) 10 MG tablet 1 tab po qhs prn insomnia  30 tablet  3  . ipratropium (ATROVENT) 0.03 % nasal spray Two sprays three times a day as needed before meals      . isosorbide mononitrate (IMDUR) 60 MG 24 hr tablet Take 1 tablet (60 mg total) by mouth every morning.  30 tablet  11  . KLOR-CON 10 10 MEQ tablet TAKE 1 TABLET BY MOUTH EVERY DAY  30 tablet  6  . levalbuterol (XOPENEX HFA) 45 MCG/ACT inhaler Inhale 1-2 puffs into the lungs every 4 (four) hours as needed. For shortness of breath  1 Inhaler  6  . levalbuterol (XOPENEX HFA) 45 MCG/ACT inhaler Inhale 1-2 puffs into the lungs every 6 (six) hours as needed.      Marland Kitchen levothyroxine (SYNTHROID, LEVOTHROID) 125 MCG tablet Take 125 mcg by mouth daily. Take a whole tablet Monday, Wednesday, Fridays. Take a a 1/2 tablet on Sundays, Tuesdays, Thursdays, and Saturdays.      Marland Kitchen lithium carbonate (LITHOBID) 300 MG CR tablet Take 300 mg by mouth 2 (two) times daily.      . nitroGLYCERIN (NITROSTAT) 0.3 MG SL tablet Place 0.3 mg under the tongue every 5 (five) minutes as needed. For chest pain      . NON FORMULARY 2 Act daily. Flutter Valve.       . NON FORMULARY as directed. Home nebulizer.       . predniSONE (DELTASONE) 20 MG tablet 1 tab po qd x 5d  5 tablet  0  . rosuvastatin (CRESTOR) 5 MG tablet Take 5 mg by mouth once a week. Sundays      . sitaGLIPtin (JANUVIA) 100 MG tablet Take 1 tablet (100 mg total) by mouth daily.  30 tablet  3  . Tamsulosin HCl (FLOMAX) 0.4 MG CAPS Take 0.4 mg by mouth daily.        Marland Kitchen tiotropium  (SPIRIVA) 18 MCG inhalation capsule Place 1 capsule (  18 mcg total) into inhaler and inhale daily.  30 capsule  5  . warfarin (COUMADIN) 5 MG tablet TAKE AS DIRECTED PER COUMADIN CLINIC  30 tablet  3    PE: Blood pressure 101/62, pulse 56, height 5' 10.5" (1.791 m), weight 166 lb (75.297 kg), SpO2 98.00%. Gen: Alert, well appearing.  Patient is oriented to person, place, time, and situation. CV: RRR, no m/r/g (rate about 60-65) LUNGS: CTA bilat except an occasional end exp wheeze.  Exp phase is not prolonged.  Aeration is good and breathing is nonlabored. EXT: no clubbing, cyanosis, or edema.   IMPRESSION AND PLAN:  Dizziness - light-headed Certainly sounds like orthostatic hypotension. Even though this is happening infrequently for the most part, I'll call Dr. Riley Kill to see if he would agree with slight lowering of one of his BP meds since these sx's make him a higher fall risk and he is on coumadin.  COPD I feel like he is currently about at his baseline. Conitnue current management.     FOLLOW UP: 1 mo

## 2011-10-25 NOTE — Assessment & Plan Note (Signed)
Certainly sounds like orthostatic hypotension. Even though this is happening infrequently for the most part, I'll call Dr. Riley Kill to see if he would agree with slight lowering of one of his BP meds since these sx's make him a higher fall risk and he is on coumadin.

## 2011-10-28 ENCOUNTER — Telehealth: Payer: Self-pay | Admitting: Family Medicine

## 2011-10-28 MED ORDER — OLMESARTAN MEDOXOMIL 5 MG PO TABS: 5.0000 mg | ORAL_TABLET | Freq: Every day | ORAL | Status: DC

## 2011-10-28 NOTE — Telephone Encounter (Signed)
Spoke with pt today and let him know that Dr. Riley Kill was ok with the plan of lowering bp med to see if it helps decrease/abolish his dizziness spells. Will have Stephanie call in benicar 5mg  (he currently is on 10mg  qd) tabs to take 1 daily. --PM

## 2011-10-28 NOTE — Telephone Encounter (Signed)
RX done. 

## 2011-10-30 ENCOUNTER — Ambulatory Visit (INDEPENDENT_AMBULATORY_CARE_PROVIDER_SITE_OTHER): Payer: Medicare Other | Admitting: *Deleted

## 2011-10-30 ENCOUNTER — Encounter: Payer: Self-pay | Admitting: Cardiology

## 2011-10-30 ENCOUNTER — Ambulatory Visit (INDEPENDENT_AMBULATORY_CARE_PROVIDER_SITE_OTHER): Payer: Medicare Other | Admitting: Cardiology

## 2011-10-30 VITALS — BP 118/78 | HR 60 | Ht 71.0 in | Wt 166.0 lb

## 2011-10-30 DIAGNOSIS — I2510 Atherosclerotic heart disease of native coronary artery without angina pectoris: Secondary | ICD-10-CM

## 2011-10-30 DIAGNOSIS — Z7901 Long term (current) use of anticoagulants: Secondary | ICD-10-CM

## 2011-10-30 DIAGNOSIS — I712 Thoracic aortic aneurysm, without rupture, unspecified: Secondary | ICD-10-CM

## 2011-10-30 DIAGNOSIS — N182 Chronic kidney disease, stage 2 (mild): Secondary | ICD-10-CM

## 2011-10-30 DIAGNOSIS — I4891 Unspecified atrial fibrillation: Secondary | ICD-10-CM

## 2011-10-30 DIAGNOSIS — I2589 Other forms of chronic ischemic heart disease: Secondary | ICD-10-CM

## 2011-10-30 DIAGNOSIS — I10 Essential (primary) hypertension: Secondary | ICD-10-CM

## 2011-10-30 NOTE — Patient Instructions (Addendum)
Your physician recommends that you have lab work today: [facility]  Your physician has requested that you have an echocardiogram. Echocardiography is a painless test that uses sound waves to create images of your heart. It provides your doctor with information about the size and shape of your heart and how well your heart's chambers and valves are working. This procedure takes approximately one hour. There are no restrictions for this procedure.  Your physician recommends that you continue on your current medications as directed. Please refer to the Current Medication list given to you today.  Your physician recommends that you schedule a follow-up appointment in: 6 WEEKS with Dr Riley Kill

## 2011-10-31 LAB — BASIC METABOLIC PANEL
BUN: 21 mg/dL (ref 6–23)
CO2: 29 mEq/L (ref 19–32)
Chloride: 106 mEq/L (ref 96–112)
Glucose, Bld: 105 mg/dL — ABNORMAL HIGH (ref 70–99)
Potassium: 3.9 mEq/L (ref 3.5–5.1)

## 2011-11-06 ENCOUNTER — Ambulatory Visit (HOSPITAL_COMMUNITY): Payer: Medicare Other | Attending: Internal Medicine | Admitting: Radiology

## 2011-11-06 ENCOUNTER — Other Ambulatory Visit (HOSPITAL_COMMUNITY): Payer: Self-pay | Admitting: Radiology

## 2011-11-06 DIAGNOSIS — I252 Old myocardial infarction: Secondary | ICD-10-CM | POA: Insufficient documentation

## 2011-11-06 DIAGNOSIS — Z87891 Personal history of nicotine dependence: Secondary | ICD-10-CM | POA: Insufficient documentation

## 2011-11-06 DIAGNOSIS — I4891 Unspecified atrial fibrillation: Secondary | ICD-10-CM

## 2011-11-06 DIAGNOSIS — J4489 Other specified chronic obstructive pulmonary disease: Secondary | ICD-10-CM | POA: Insufficient documentation

## 2011-11-06 DIAGNOSIS — I517 Cardiomegaly: Secondary | ICD-10-CM | POA: Insufficient documentation

## 2011-11-06 DIAGNOSIS — J449 Chronic obstructive pulmonary disease, unspecified: Secondary | ICD-10-CM | POA: Insufficient documentation

## 2011-11-06 DIAGNOSIS — I2510 Atherosclerotic heart disease of native coronary artery without angina pectoris: Secondary | ICD-10-CM | POA: Insufficient documentation

## 2011-11-06 DIAGNOSIS — E119 Type 2 diabetes mellitus without complications: Secondary | ICD-10-CM | POA: Insufficient documentation

## 2011-11-06 MED ORDER — PERFLUTREN PROTEIN A MICROSPH IV SUSP
0.5000 mL | Freq: Once | INTRAVENOUS | Status: AC
  Administered 2011-11-06: 0.5 mL via INTRAVENOUS

## 2011-11-06 NOTE — Progress Notes (Signed)
HPI:  The patient is seen in followup. He recently saw his primary care physician who called me. He's also been referred to [facility] by Dr. Arbie Cookey for evaluation of his complex thoracoabdominal aneurysm. The patient has gotten somewhat lightheaded with standing. He denies any current chest pain. Overall, he appears to be holding his own. He remains on amiodarone at present.  Current Outpatient Prescriptions  Medication Sig Dispense Refill  . acetaminophen (TYLENOL) 325 MG tablet Take 325 mg by mouth every 6 (six) hours as needed. For pain      . albuterol (PROVENTIL) (2.5 MG/3ML) 0.083% nebulizer solution Take 2.5 mg by nebulization every 6 (six) hours as needed. For shortness of breath      . amiodarone (PACERONE) 200 MG tablet Take 200 mg by mouth daily.       Marland Kitchen aspirin 81 MG tablet Take 81 mg by mouth daily.        . budesonide (PULMICORT) 0.25 MG/2ML nebulizer solution Take 2 mLs (0.25 mg total) by nebulization 2 (two) times daily.  60 mL  5  . budesonide (RHINOCORT AQUA) 32 MCG/ACT nasal spray Place 2 sprays into the nose daily.  1 Bottle  12  . carvedilol (COREG) 3.125 MG tablet Take 1 tablet (3.125 mg total) by mouth 2 (two) times daily.  60 tablet  11  . cetirizine (ZYRTEC) 10 MG tablet Take 10 mg by mouth daily.        . clotrimazole-betamethasone (LOTRISONE) cream Apply 1 application topically 2 (two) times daily as needed. For skin irritation      . cyanocobalamin 1000 MCG/ML injection Inject into the muscle every 30 (thirty) days.        Marland Kitchen desoximetasone (TOPICORT) 0.25 % cream Apply 1 application topically 2 (two) times daily as needed. For rash      . esomeprazole (NEXIUM) 40 MG capsule Take 1 capsule (40 mg total) by mouth daily.  30 capsule  11  . finasteride (PROSCAR) 5 MG tablet Take 5 mg by mouth daily.        . furosemide (LASIX) 40 MG tablet Take 1 tablet (40 mg total) by mouth daily.  30 tablet  3  . guaiFENesin (MUCINEX) 600 MG 12 hr tablet Take 1,200 mg by mouth 2 (two) times  daily as needed. For congestion      . guaiFENesin-dextromethorphan (ROBITUSSIN DM) 100-10 MG/5ML syrup Take 5 mLs by mouth 3 (three) times daily as needed. As needed for cough      . ipratropium (ATROVENT) 0.03 % nasal spray Two sprays three times a day as needed before meals      . isosorbide mononitrate (IMDUR) 60 MG 24 hr tablet Take 1 tablet (60 mg total) by mouth every morning.  30 tablet  11  . KLOR-CON 10 10 MEQ tablet TAKE 1 TABLET BY MOUTH EVERY DAY  30 tablet  6  . levalbuterol (XOPENEX HFA) 45 MCG/ACT inhaler Inhale 1-2 puffs into the lungs every 4 (four) hours as needed. For shortness of breath  1 Inhaler  6  . levothyroxine (SYNTHROID, LEVOTHROID) 125 MCG tablet Take 125 mcg by mouth daily. Take a whole tablet Monday, Wednesday, Fridays. Take a a 1/2 tablet on Sundays, Tuesdays, Thursdays, and Saturdays.      Marland Kitchen lithium carbonate (LITHOBID) 300 MG CR tablet Take 300 mg by mouth 2 (two) times daily.      . nitroGLYCERIN (NITROSTAT) 0.3 MG SL tablet Place 0.3 mg under the tongue every 5 (five) minutes  as needed. For chest pain      . NON FORMULARY 2 Act daily. Flutter Valve.       . NON FORMULARY as directed. Home nebulizer.       Marland Kitchen olmesartan (BENICAR) 5 MG tablet Take 1 tablet (5 mg total) by mouth daily.  30 tablet  5  . rosuvastatin (CRESTOR) 5 MG tablet Take 5 mg by mouth once a week. "Sundays      . sitaGLIPtin (JANUVIA) 100 MG tablet Take 1 tablet (100 mg total) by mouth daily.  30 tablet  3  . Tamsulosin HCl (FLOMAX) 0.4 MG CAPS Take 0.4 mg by mouth daily.        . tiotropium (SPIRIVA) 18 MCG inhalation capsule Place 1 capsule (18 mcg total) into inhaler and inhale daily.  30 capsule  5  . warfarin (COUMADIN) 5 MG tablet TAKE AS DIRECTED PER COUMADIN CLINIC  30 tablet  3  . DISCONTD: rosuvastatin (CRESTOR) 5 MG tablet Take 1 tablet (5 mg total) by mouth at bedtime.  100 tablet  3    Allergies  Allergen Reactions  . Atorvastatin     REACTION: muscle ache in legs  . Crestor  (Rosuvastatin Calcium) Other (See Comments)    Lower ext fatigue and soreness- if taken everyday  . Lisinopril Diarrhea  . Morphine     REACTION: AMS - agitation  . Prednisone     REACTION: Can't breath  . Zolpidem Tartrate     REACTION: AMS    Past Medical History  Diagnosis Date  . CAD (coronary artery disease)     post CABG with prior percutaneous intervention of teh saphenous vein graft with known saphenous vein graft disease.  . AAA (abdominal aortic aneurysm) 2010    5.8cm  . Aneurysm of thoracic aorta 1995    Rupture 1995 w/spontaneous resolution  . Popliteal artery aneurysm   . Myocardial infarction   . Ischemic cardiomyopathy   . Paroxysmal atrial fibrillation   . Hypertension   . Peripheral vascular disease   . Dyslipidemia   . Diabetes mellitus 2007    dx'd 2007 w/persisting FBS>125  . GERD (gastroesophageal reflux disease)   . Hiatal hernia   . Diverticular disease   . BPH (benign prostatic hypertrophy)     with hx of acute urinary retention (pt can self-cath)  . COPD (chronic obstructive pulmonary disease)     12" /28/09 PFT FEV1 1.84 (55%), FVC 4.42 (95%), TLC 6.46 (92%), DLCO 72%, no BD response  . Pneumonia 1/09  . Allergic rhinitis   . Shingles     X 2 episodes, both in left scapula area  . OSA (obstructive sleep apnea) 06/14/09    PSG RDI 17, PLMI 96, intolerant of CPAP or BPAP  . Chronic renal insufficiency     Baseline Cr 1.5  . Prostate cancer 07/2008    Low grade; watchful waiting with [name]  . Pulmonary nodule, left 03/02/2010    Left base; resolved on f/u CT  . Allergic rhinitis   . Carpal tunnel syndrome of left wrist 02/11/2011  . IMPLANTATION OF DEFIBRILLATOR, HX OF 09/02/2008  . IRON DEFICIENCY 06/08/2010    Hemoccult negative:  GI ([name]) recommended no invasive diagnostics--watchful waiting (2012).  . UNSPECIFIED ANEMIA 06/01/2010    Multifactorial: iron def, CRI, vit B12 def,     Past Surgical History  Procedure Date  .  Cardiac defibrillator placement 08/28/2004    [name]  . Colon surgery 1995  partial colectomy  . Coronary artery bypass graft 1996  . Aorta - bilateral femoral artery bypass graft 1999    [name] Early  . Nose surgery   . Insertion of pacing lead     New rate sensing pacing lead with removal of a previous implanted ICD and insertion of a device back in the pocket with defibrillation threshold testing  . Abdominal aortic aneurysm repair 1999    Ingrarenal repair/ graft  . Stents     Placed to the saphenous vein graft  . Radiofrequency ablation 2006    A flutter    Family History  Problem Relation Age of Onset  . Stroke Mother 50  . Heart attack Mother     CVA  . Cancer Father 17    Lung  . Heart attack Father 37  . Lung cancer Father   . Coronary artery disease Other     2 of 5 siblings with CAD    History   Social History  . Marital Status: Married    Spouse Name: N/A    Number of Children: N/A  . Years of Education: N/A   Occupational History  . RETIRED     Mechanic   Social History Main Topics  . Smoking status: Former Smoker -- 1.0 packs/day for 30 years    Types: Cigarettes    Quit date: 05/23/2003  . Smokeless tobacco: Former Neurosurgeon    Types: Chew    Quit date: 05/14/2003   Comment: 80 pack a year history  . Alcohol Use: 0.6 oz/week    1 Cans of beer per week     only occ  . Drug Use: No  . Sexually Active: Not on file   Other Topics Concern  . Not on file   Social History Narrative   Lives in [place] with his wife.Very involved in his church.Enjoys bluegrass music, going to [employer], spending time with extended family.Used to play golf twice a week up until 2012.No alc/drug use.Former smoker, quit 2005.    ROS: Please see the HPI.  All other systems reviewed and negative.  PHYSICAL EXAM:  BP 118/78  Pulse 60  Ht 5\' 11"  (1.803 m)  Wt 166 lb (75.297 kg)  BMI 23.15 kg/m2  BP by me supine 130/70 standing 120/70  General:  Well developed, well nourished, in no acute distress. Head:  Normocephalic and atraumatic. Neck: no JVD Lungs: Decrease BS and prolonged expiration.   Heart: Normal S1 and S2.  MVP type murmur at the apex.   Abdomen:  Normal bowel sounds; soft; non tender; no organomegaly Extremities: No clubbing or cyanosis. No edema. Neurologic: Alert and oriented x 3.  EKG:  SB.  Left axis deviation.  Possible old anteroseptal MI.  Nonspecific ST and T abnl.  Poss lateral ischemia.    ASSESSMENT AND PLAN:

## 2011-11-06 NOTE — Progress Notes (Signed)
Echocardiogram performed.  

## 2011-11-08 ENCOUNTER — Ambulatory Visit (INDEPENDENT_AMBULATORY_CARE_PROVIDER_SITE_OTHER): Payer: Medicare Other | Admitting: Internal Medicine

## 2011-11-08 ENCOUNTER — Ambulatory Visit (INDEPENDENT_AMBULATORY_CARE_PROVIDER_SITE_OTHER): Payer: Medicare Other | Admitting: Family Medicine

## 2011-11-08 ENCOUNTER — Encounter: Payer: Self-pay | Admitting: Internal Medicine

## 2011-11-08 VITALS — BP 123/72 | HR 53 | Ht 71.0 in | Wt 166.1 lb

## 2011-11-08 DIAGNOSIS — T82110A Breakdown (mechanical) of cardiac electrode, initial encounter: Secondary | ICD-10-CM

## 2011-11-08 DIAGNOSIS — E538 Deficiency of other specified B group vitamins: Secondary | ICD-10-CM

## 2011-11-08 DIAGNOSIS — I2589 Other forms of chronic ischemic heart disease: Secondary | ICD-10-CM

## 2011-11-08 MED ORDER — CYANOCOBALAMIN 1000 MCG/ML IJ SOLN
1000.0000 ug | Freq: Once | INTRAMUSCULAR | Status: AC
  Administered 2011-11-08: 1000 ug via INTRAMUSCULAR

## 2011-11-08 NOTE — Patient Instructions (Signed)
RETURN IN ONE MONTH FOR NEXT INJECTION

## 2011-11-08 NOTE — Progress Notes (Signed)
Pt arrived for monthly B12 injection.  Pt tolerated well in right deltoid.

## 2011-11-08 NOTE — Patient Instructions (Addendum)
Your physician wants you to follow-up in: 6 months with Dr Taylor You will receive a reminder letter in the mail two months in advance. If you don't receive a letter, please call our office to schedule the follow-up appointment.  

## 2011-11-09 ENCOUNTER — Encounter: Payer: Self-pay | Admitting: Internal Medicine

## 2011-11-09 NOTE — Assessment & Plan Note (Signed)
I have discussed lead extraction with the patient and his wife. For now, will hold off on this as he has not had any ICD therapies and he has other life threatening co-morbidities.

## 2011-11-09 NOTE — Assessment & Plan Note (Signed)
He denies anginal symptoms. Continue current meds and maintain a low sodium diet.

## 2011-11-09 NOTE — Progress Notes (Signed)
HPI [name] returns today for followup. He is a pleasant 76 yo man with multiple medical problems including an ICM, chronic systolic CHF, PAF, and HTN. The patient was diagnosed with an enlarging abdominal aneurysm and is considering insertion of a stent graft. He denies chest pain. He was recently found to a broken ICD lead. He had a rate/sense lead placed approximately 4 years ago. He has now developed a break in his high voltage portion of his ICD lead. In the interim, he has seen a vascular surgeon at [facility] and is considering insertion of a stent graft. He does not have abdominal pain but his aneurysm has been shown to have been enlarging.  Allergies  Allergen Reactions  . Atorvastatin     REACTION: muscle ache in legs  . Crestor (Rosuvastatin Calcium) Other (See Comments)    Lower ext fatigue and soreness- if taken everyday  . Lisinopril Diarrhea  . Morphine     REACTION: AMS - agitation  . Prednisone     REACTION: Can't breath  . Zolpidem Tartrate     REACTION: AMS     Current Outpatient Prescriptions  Medication Sig Dispense Refill  . acetaminophen (TYLENOL) 325 MG tablet Take 325 mg by mouth every 6 (six) hours as needed. For pain      . albuterol (PROVENTIL) (2.5 MG/3ML) 0.083% nebulizer solution Take 2.5 mg by nebulization every 6 (six) hours as needed. For shortness of breath      . amiodarone (PACERONE) 200 MG tablet Take 200 mg by mouth daily.       Marland Kitchen aspirin 81 MG tablet Take 81 mg by mouth daily.        . budesonide (PULMICORT) 0.25 MG/2ML nebulizer solution Take 2 mLs (0.25 mg total) by nebulization 2 (two) times daily.  60 mL  5  . budesonide (RHINOCORT AQUA) 32 MCG/ACT nasal spray Place 2 sprays into the nose daily.  1 Bottle  12  . carvedilol (COREG) 3.125 MG tablet Take 1 tablet (3.125 mg total) by mouth 2 (two) times daily.  60 tablet  11  . cetirizine (ZYRTEC) 10 MG tablet Take 10 mg by mouth daily.        . clotrimazole-betamethasone (LOTRISONE) cream Apply 1  application topically 2 (two) times daily as needed. For skin irritation      . cyanocobalamin 1000 MCG/ML injection Inject into the muscle every 30 (thirty) days.        Marland Kitchen desoximetasone (TOPICORT) 0.25 % cream Apply 1 application topically 2 (two) times daily as needed. For rash      . esomeprazole (NEXIUM) 40 MG capsule Take 1 capsule (40 mg total) by mouth daily.  30 capsule  11  . finasteride (PROSCAR) 5 MG tablet Take 5 mg by mouth daily.        . furosemide (LASIX) 40 MG tablet Take 1 tablet (40 mg total) by mouth daily.  30 tablet  3  . guaiFENesin (MUCINEX) 600 MG 12 hr tablet Take 1,200 mg by mouth 2 (two) times daily as needed. For congestion      . guaiFENesin-dextromethorphan (ROBITUSSIN DM) 100-10 MG/5ML syrup Take 5 mLs by mouth 3 (three) times daily as needed. As needed for cough      . ipratropium (ATROVENT) 0.03 % nasal spray Two sprays three times a day as needed before meals      . isosorbide mononitrate (IMDUR) 60 MG 24 hr tablet Take 1 tablet (60 mg total) by mouth every morning.  30 tablet  11  . KLOR-CON 10 10 MEQ tablet TAKE 1 TABLET BY MOUTH EVERY DAY  30 tablet  6  . levalbuterol (XOPENEX HFA) 45 MCG/ACT inhaler Inhale 1-2 puffs into the lungs every 4 (four) hours as needed. For shortness of breath  1 Inhaler  6  . levothyroxine (SYNTHROID, LEVOTHROID) 125 MCG tablet Take 125 mcg by mouth daily. Take a whole tablet Monday, Wednesday, Fridays. Take a a 1/2 tablet on "Sundays, Tuesdays, Thursdays, and Saturdays.      . lithium carbonate (LITHOBID) 300 MG CR tablet Take 300 mg by mouth 2 (two) times daily.      . nitroGLYCERIN (NITROSTAT) 0.3 MG SL tablet Place 0.3 mg under the tongue every 5 (five) minutes as needed. For chest pain      . NON FORMULARY 2 Act daily. Flutter Valve.       . NON FORMULARY as directed. Home nebulizer.       . olmesartan (BENICAR) 5 MG tablet Take 1 tablet (5 mg total) by mouth daily.  30 tablet  5  . rosuvastatin (CRESTOR) 5 MG tablet Take 5 mg  by mouth once a week. Sundays      . sitaGLIPtin (JANUVIA) 100 MG tablet Take 1 tablet (100 mg total) by mouth daily.  30 tablet  3  . Tamsulosin HCl (FLOMAX) 0.4 MG CAPS Take 0.4 mg by mouth daily.        . tiotropium (SPIRIVA) 18 MCG inhalation capsule Place 1 capsule (18 mcg total) into inhaler and inhale daily.  30 capsule  5  . warfarin (COUMADIN) 5 MG tablet TAKE AS DIRECTED PER COUMADIN CLINIC  30 tablet  3  . DISCONTD: rosuvastatin (CRESTOR) 5 MG tablet Take 1 tablet (5 mg total) by mouth at bedtime.  100 tablet  3     Past Medical History  Diagnosis Date  . CAD (coronary artery disease)     post CABG with prior percutaneous intervention of teh saphenous vein graft with known saphenous vein graft disease.  . AAA (abdominal aortic aneurysm) 2010    5.8cm  . Aneurysm of thoracic aorta 1995    Rupture 1995 w/spontaneous resolution  . Popliteal artery aneurysm   . Myocardial infarction   . Ischemic cardiomyopathy   . Paroxysmal atrial fibrillation   . Hypertension   . Peripheral vascular disease   . Dyslipidemia   . Diabetes mellitus 2007    dx'd 2007 w/persisting FBS>125  . GERD (gastroesophageal reflux disease)   . Hiatal hernia   . Diverticular disease   . BPH (benign prostatic hypertrophy)     with hx of acute urinary retention (pt can self-cath)  . COPD (chronic obstructive pulmonary disease)     12" /28/09 PFT FEV1 1.84 (55%), FVC 4.42 (95%), TLC 6.46 (92%), DLCO 72%, no BD response  . Pneumonia 1/09  . Allergic rhinitis   . Shingles     X 2 episodes, both in left scapula area  . OSA (obstructive sleep apnea) 06/14/09    PSG RDI 17, PLMI 96, intolerant of CPAP or BPAP  . Chronic renal insufficiency     Baseline Cr 1.5  . Prostate cancer 07/2008    Low grade; watchful waiting with [name]  . Pulmonary nodule, left 03/02/2010    Left base; resolved on f/u CT  . Allergic rhinitis   . Carpal tunnel syndrome of left wrist 02/11/2011  . IMPLANTATION OF  DEFIBRILLATOR, HX OF 09/02/2008  . IRON DEFICIENCY 06/08/2010  Hemoccult negative:  GI ([name]) recommended no invasive diagnostics--watchful waiting (2012).  . UNSPECIFIED ANEMIA 06/01/2010    Multifactorial: iron def, CRI, vit B12 def,     ROS:   All systems reviewed and negative except as noted in the HPI.   Past Surgical History  Procedure Date  . Cardiac defibrillator placement 08/28/2004    [name]  . Colon surgery 1995    partial colectomy  . Coronary artery bypass graft 1996  . Aorta - bilateral femoral artery bypass graft 1999    [name] Early  . Nose surgery   . Insertion of pacing lead     New rate sensing pacing lead with removal of a previous implanted ICD and insertion of a device back in the pocket with defibrillation threshold testing  . Abdominal aortic aneurysm repair 1999    Ingrarenal repair/ graft  . Stents     Placed to the saphenous vein graft  . Radiofrequency ablation 2006    A flutter     Family History  Problem Relation Age of Onset  . Stroke Mother 65  . Heart attack Mother     CVA  . Cancer Father 35    Lung  . Heart attack Father 15  . Lung cancer Father   . Coronary artery disease Other     2 of 5 siblings with CAD     History   Social History  . Marital Status: Married    Spouse Name: N/A    Number of Children: N/A  . Years of Education: N/A   Occupational History  . RETIRED     Mechanic   Social History Main Topics  . Smoking status: Former Smoker -- 1.0 packs/day for 30 years    Types: Cigarettes    Quit date: 05/23/2003  . Smokeless tobacco: Former Neurosurgeon    Types: Chew    Quit date: 05/14/2003   Comment: 80 pack a year history  . Alcohol Use: 0.6 oz/week    1 Cans of beer per week     only occ  . Drug Use: No  . Sexually Active: Not on file   Other Topics Concern  . Not on file   Social History Narrative   Lives in [place] with his wife.Very involved in his church.Enjoys bluegrass music,  going to [employer], spending time with extended family.Used to play golf twice a week up until 2012.No alc/drug use.Former smoker, quit 2005.     BP 123/72  Pulse 53  Ht 5\' 11"  (1.803 m)  Wt 166 lb 1.9 oz (75.352 kg)  BMI 23.17 kg/m2  Physical Exam:  Well appearing NAD HEENT: Unremarkable Neck:  7 cm JVD, no thyromegally Lungs:  Clear with no wheezes. HEART:  Regular rate rhythm, no murmurs, no rubs, no clicks Abd:  soft, positive bowel sounds, no organomegally, no rebound, no guarding Ext:  2 plus pulses, no edema, no cyanosis, no clubbing Skin:  No rashes no nodules Neuro:  CN II through XII intact, motor grossly intact  DEVICE  Normal device function.  See PaceArt for details.   Assess/Plan:

## 2011-11-12 ENCOUNTER — Other Ambulatory Visit: Payer: Self-pay | Admitting: *Deleted

## 2011-11-12 DIAGNOSIS — K219 Gastro-esophageal reflux disease without esophagitis: Secondary | ICD-10-CM

## 2011-11-12 MED ORDER — ESOMEPRAZOLE MAGNESIUM 40 MG PO CPDR: 40.0000 mg | DELAYED_RELEASE_CAPSULE | Freq: Every day | ORAL | Status: DC

## 2011-11-12 NOTE — Telephone Encounter (Signed)
Faxed refill request received from pharmacy for NEXIUM  Last filled by MD on 12/04/10, #30 X 11 RX SENT.

## 2011-11-15 ENCOUNTER — Other Ambulatory Visit: Payer: Self-pay | Admitting: *Deleted

## 2011-11-15 DIAGNOSIS — I428 Other cardiomyopathies: Secondary | ICD-10-CM

## 2011-11-15 MED ORDER — FUROSEMIDE 40 MG PO TABS: 40.0000 mg | ORAL_TABLET | Freq: Every day | ORAL | Status: DC

## 2011-11-15 NOTE — Telephone Encounter (Signed)
Faxed refill request received from pharmacy for furosemide Last filled by MD on 07/24/11, #30 x 3 Last seen on 10/25/11 Follow up 11/22/11 RX sent.

## 2011-11-20 ENCOUNTER — Ambulatory Visit (INDEPENDENT_AMBULATORY_CARE_PROVIDER_SITE_OTHER): Payer: Medicare Other | Admitting: Pharmacist

## 2011-11-20 DIAGNOSIS — Z7901 Long term (current) use of anticoagulants: Secondary | ICD-10-CM

## 2011-11-20 DIAGNOSIS — I4891 Unspecified atrial fibrillation: Secondary | ICD-10-CM

## 2011-11-22 ENCOUNTER — Ambulatory Visit (INDEPENDENT_AMBULATORY_CARE_PROVIDER_SITE_OTHER): Payer: Medicare Other | Admitting: Family Medicine

## 2011-11-22 ENCOUNTER — Encounter: Payer: Self-pay | Admitting: Family Medicine

## 2011-11-22 VITALS — BP 121/68 | HR 55 | Ht 70.5 in | Wt 164.0 lb

## 2011-11-22 DIAGNOSIS — N189 Chronic kidney disease, unspecified: Secondary | ICD-10-CM

## 2011-11-22 DIAGNOSIS — I10 Essential (primary) hypertension: Secondary | ICD-10-CM

## 2011-11-22 DIAGNOSIS — J449 Chronic obstructive pulmonary disease, unspecified: Secondary | ICD-10-CM

## 2011-11-22 DIAGNOSIS — I739 Peripheral vascular disease, unspecified: Secondary | ICD-10-CM

## 2011-11-22 LAB — BASIC METABOLIC PANEL
BUN: 21 mg/dL (ref 6–23)
CO2: 29 mEq/L (ref 19–32)
Chloride: 106 mEq/L (ref 96–112)
Creatinine, Ser: 1.3 mg/dL (ref 0.4–1.5)
Glucose, Bld: 91 mg/dL (ref 70–99)

## 2011-11-22 NOTE — Progress Notes (Signed)
OFFICE NOTE  11/24/2011  CC:  Chief Complaint  Roberto [name] presents with  . Follow-up    BP     HPI: Roberto [name] is a 76 y.o. Caucasian male who is here for 1 mo f/u HTN, COPD, peripheral vasc disease. He went to [facility] and saw vascular surgeon (Dr. Pattricia Boss) for consideration of repair of descending thoracic aortic aneurism.  There are many risks of the surgery and he's particularly worried about the possibility of paralysis or kidney damage.  It is a long stay in the hospital and long recovery period, plus his ischemic cardiomyopathy may make it too risky. He is currently stable regarding his COPD sx's.  No coughing.  He has some SOB with moderate activity but this abates rapidly with rest and/or albuterol. Since changing his benicar from 10mg  to 5mg  qd he feels like he hasn't had nearly as much orthostatic dizziness. No fever, no LE edema, no n/v/abd pain.  Pertinent PMH:  Past Medical History  Diagnosis Date  . CAD (coronary artery disease)     post CABG with prior percutaneous intervention of teh saphenous vein graft with known saphenous vein graft disease.  Marland Kitchen AAA (abdominal aortic aneurysm) 2010    5.8cm  . Aneurysm of thoracic aorta 1995    Rupture 1995 w/spontaneous resolution  . Popliteal artery aneurysm   . Myocardial infarction   . Ischemic cardiomyopathy   . Paroxysmal atrial fibrillation   . Hypertension   . Peripheral vascular disease   . Dyslipidemia   . Diabetes mellitus 2007    dx'd 2007 w/persisting FBS>125  . GERD (gastroesophageal reflux disease)   . Hiatal hernia   . Diverticular disease   . BPH (benign prostatic hypertrophy)     with hx of acute urinary retention (pt can self-cath)  . COPD (chronic obstructive pulmonary disease)     05/09/08 PFT FEV1 1.84 (55%), FVC 4.42 (95%), TLC 6.46 (92%), DLCO 72%, no BD response  . Pneumonia 1/09  . Allergic rhinitis   . Shingles     X 2 episodes, both in left scapula area  . OSA (obstructive sleep apnea) 06/14/09    PSG  RDI 17, PLMI 96, intolerant of CPAP or BPAP  . Chronic renal insufficiency     Baseline Cr 1.5  . Prostate cancer 07/2008    Low grade; watchful waiting with [name]  . Pulmonary nodule, left 03/02/2010    Left base; resolved on f/u CT  . Allergic rhinitis   . Carpal tunnel syndrome of left wrist 02/11/2011  . IMPLANTATION OF DEFIBRILLATOR, HX OF 09/02/2008  . IRON DEFICIENCY 06/08/2010    Hemoccult negative:  GI ([name]) recommended no invasive diagnostics--watchful waiting (2012).  . UNSPECIFIED ANEMIA 06/01/2010    Multifactorial: iron def, CRI, vit B12 def,    Past Surgical History  Procedure Date  . Cardiac defibrillator placement 08/28/2004    [name]  . Colon surgery 1995    partial colectomy  . Coronary artery bypass graft 1996  . Aorta - bilateral femoral artery bypass graft 1999    [name] Early  . Nose surgery   . Insertion of pacing lead     New rate sensing pacing lead with removal of a previous implanted ICD and insertion of a device back in the pocket with defibrillation threshold testing  . Abdominal aortic aneurysm repair 1999    Ingrarenal repair/ graft  . Stents     Placed to the saphenous vein graft  . Radiofrequency  ablation 2006    A flutter   Past family and social history reviewed and there are no changes since the Roberto [name]'s last office visit with me.  MEDS:  Outpatient Prescriptions Prior to Visit  Medication Sig Dispense Refill  . acetaminophen (TYLENOL) 325 MG tablet Take 325 mg by mouth every 6 (six) hours as needed. For pain      . albuterol (PROVENTIL) (2.5 MG/3ML) 0.083% nebulizer solution Take 2.5 mg by nebulization every 6 (six) hours as needed. For shortness of breath      . amiodarone (PACERONE) 200 MG tablet Take 200 mg by mouth daily.       Marland Kitchen aspirin 81 MG tablet Take 81 mg by mouth daily.        . budesonide (PULMICORT) 0.25 MG/2ML nebulizer solution Take 2 mLs (0.25 mg total) by nebulization 2 (two) times daily.  60  mL  5  . budesonide (RHINOCORT AQUA) 32 MCG/ACT nasal spray Place 2 sprays into the nose daily.  1 Bottle  12  . carvedilol (COREG) 3.125 MG tablet Take 1 tablet (3.125 mg total) by mouth 2 (two) times daily.  60 tablet  11  . cetirizine (ZYRTEC) 10 MG tablet Take 10 mg by mouth daily.        . clotrimazole-betamethasone (LOTRISONE) cream Apply 1 application topically 2 (two) times daily as needed. For skin irritation      . cyanocobalamin 1000 MCG/ML injection Inject into the muscle every 30 (thirty) days.        Marland Kitchen desoximetasone (TOPICORT) 0.25 % cream Apply 1 application topically 2 (two) times daily as needed. For rash      . esomeprazole (NEXIUM) 40 MG capsule Take 1 capsule (40 mg total) by mouth daily.  30 capsule  11  . finasteride (PROSCAR) 5 MG tablet Take 5 mg by mouth daily.        . furosemide (LASIX) 40 MG tablet Take 1 tablet (40 mg total) by mouth daily.  30 tablet  3  . guaiFENesin (MUCINEX) 600 MG 12 hr tablet Take 1,200 mg by mouth 2 (two) times daily as needed. For congestion      . guaiFENesin-dextromethorphan (ROBITUSSIN DM) 100-10 MG/5ML syrup Take 5 mLs by mouth 3 (three) times daily as needed. As needed for cough      . ipratropium (ATROVENT) 0.03 % nasal spray Two sprays three times a day as needed before meals      . isosorbide mononitrate (IMDUR) 60 MG 24 hr tablet Take 1 tablet (60 mg total) by mouth every morning.  30 tablet  11  . KLOR-CON 10 10 MEQ tablet TAKE 1 TABLET BY MOUTH EVERY DAY  30 tablet  6  . levalbuterol (XOPENEX HFA) 45 MCG/ACT inhaler Inhale 1-2 puffs into the lungs every 4 (four) hours as needed. For shortness of breath  1 Inhaler  6  . levothyroxine (SYNTHROID, LEVOTHROID) 125 MCG tablet Take 125 mcg by mouth daily. Take a whole tablet Monday, Wednesday, Fridays. Take a a 1/2 tablet on Sundays, Tuesdays, Thursdays, and Saturdays.      Marland Kitchen lithium carbonate (LITHOBID) 300 MG CR tablet Take 300 mg by mouth 2 (two) times daily.      . nitroGLYCERIN  (NITROSTAT) 0.3 MG SL tablet Place 0.3 mg under the tongue every 5 (five) minutes as needed. For chest pain      . NON FORMULARY 2 Act daily. Flutter Valve.       . NON FORMULARY as directed. Home  nebulizer.       Marland Kitchen olmesartan (BENICAR) 5 MG tablet Take 1 tablet (5 mg total) by mouth daily.  30 tablet  5  . rosuvastatin (CRESTOR) 5 MG tablet Take 5 mg by mouth once a week. Sundays      . sitaGLIPtin (JANUVIA) 100 MG tablet Take 1 tablet (100 mg total) by mouth daily.  30 tablet  3  . Tamsulosin HCl (FLOMAX) 0.4 MG CAPS Take 0.4 mg by mouth daily.        Marland Kitchen tiotropium (SPIRIVA) 18 MCG inhalation capsule Place 1 capsule (18 mcg total) into inhaler and inhale daily.  30 capsule  5  . warfarin (COUMADIN) 5 MG tablet TAKE AS DIRECTED PER COUMADIN CLINIC  30 tablet  3    PE: Blood pressure 121/68, pulse 55, height 5' 10.5" (1.791 m), weight 164 lb (74.39 kg), SpO2 98.00%. Gen: Alert, well appearing.  Roberto [name] is oriented to person, place, time, and situation. CV: RRR, no m/r/g.   LUNGS: CTA bilat, nonlabored resps, good aeration in all lung fields. EXT: no clubbing, cyanosis, or edema.   IMPRESSION AND PLAN:  PERIPHERAL VASCULAR DISEASE Currently contemplating surgical procedure on his descending thoracic aortic aneurism, but he is leaning towards not getting the procedure done.  HYPERTENSION Stable.  His orthostatic hypotension symptoms seem to have improved on lower dose of benicar (5mg ). Will check BMET today.  COPD Problem stable.  Continue current medications and diet appropriate for this condition.  We have reviewed our general long term plan for this problem and also reviewed symptoms and signs that should prompt the Roberto [name] to call or return to the office.      FOLLOW UP: 2 mo

## 2011-11-24 NOTE — Assessment & Plan Note (Signed)
Currently on Amiodarone and holding rhythm.

## 2011-11-24 NOTE — Assessment & Plan Note (Signed)
Controlled.  

## 2011-11-24 NOTE — Assessment & Plan Note (Signed)
Problem stable.  Continue current medications and diet appropriate for this condition.  We have reviewed our general long term plan for this problem and also reviewed symptoms and signs that should prompt the patient to call or return to the office.  

## 2011-11-24 NOTE — Assessment & Plan Note (Addendum)
Will recheck echo at the present time to reassess status.  Has reduced EF overall.  Also has fairly prominent murmur.

## 2011-11-24 NOTE — Assessment & Plan Note (Signed)
No current angina.  Has occluded RCA and OM SVG with patent diagonal and LIMA grafts.  LV function is a reflection of those findings.  Will recheck 2D echo, but likely will continue with same findings.

## 2011-11-24 NOTE — Assessment & Plan Note (Signed)
Stable.  His orthostatic hypotension symptoms seem to have improved on lower dose of benicar (5mg ). Will check BMET today.

## 2011-11-24 NOTE — Assessment & Plan Note (Signed)
Has been referred to [facility].

## 2011-11-24 NOTE — Assessment & Plan Note (Signed)
Currently contemplating surgical procedure on his descending thoracic aortic aneurism, but he is leaning towards not getting the procedure done.

## 2011-11-24 NOTE — Assessment & Plan Note (Signed)
Need to watch.

## 2011-12-09 ENCOUNTER — Encounter: Payer: Self-pay | Admitting: Cardiology

## 2011-12-09 ENCOUNTER — Ambulatory Visit (INDEPENDENT_AMBULATORY_CARE_PROVIDER_SITE_OTHER): Payer: Medicare Other | Admitting: Cardiology

## 2011-12-09 VITALS — BP 103/63 | HR 53 | Ht 71.5 in | Wt 165.8 lb

## 2011-12-09 DIAGNOSIS — I4891 Unspecified atrial fibrillation: Secondary | ICD-10-CM

## 2011-12-09 DIAGNOSIS — I712 Thoracic aortic aneurysm, without rupture: Secondary | ICD-10-CM

## 2011-12-09 DIAGNOSIS — I25810 Atherosclerosis of coronary artery bypass graft(s) without angina pectoris: Secondary | ICD-10-CM

## 2011-12-09 DIAGNOSIS — I2589 Other forms of chronic ischemic heart disease: Secondary | ICD-10-CM

## 2011-12-09 NOTE — Assessment & Plan Note (Signed)
Patient is been seen at [facility], and a complex operation suggested. Given the risks of the procedure, the patient has declined, and preferred course of conservative course of action. He apparently was told that he had about 2 years. We've encouraged him to remember that many of these things are completely unpredictable.

## 2011-12-09 NOTE — Progress Notes (Signed)
HPI:  The patient is seen today in followup. From a cardiac standpoint continue to remain stable. He's not had any new chest pain. He has had a little bit of back pain but mainly while working in his garden. Notably, the patient went to [facility] where he was seen in the vascular surgery clinic. After calling him the risks associated with the complex surgery was proposed, the patient subsequently decided not to have that done. He's been referred by Dr. Arbie Cookey.  The patient's most recent echocardiogram x-ray demonstrated some improvement with an estimated ejection fraction in the range of 30-35%  Current Outpatient Prescriptions  Medication Sig Dispense Refill  . acetaminophen (TYLENOL) 325 MG tablet Take 325 mg by mouth every 6 (six) hours as needed. For pain      . albuterol (PROVENTIL) (2.5 MG/3ML) 0.083% nebulizer solution Take 2.5 mg by nebulization every 6 (six) hours as needed. For shortness of breath      . amiodarone (PACERONE) 200 MG tablet Take 200 mg by mouth daily.       Marland Kitchen aspirin 81 MG tablet Take 81 mg by mouth daily.        . budesonide (PULMICORT) 0.25 MG/2ML nebulizer solution Take 2 mLs (0.25 mg total) by nebulization 2 (two) times daily.  60 mL  5  . budesonide (RHINOCORT AQUA) 32 MCG/ACT nasal spray Place 2 sprays into the nose daily.  1 Bottle  12  . carvedilol (COREG) 3.125 MG tablet Take 1 tablet (3.125 mg total) by mouth 2 (two) times daily.  60 tablet  11  . cetirizine (ZYRTEC) 10 MG tablet Take 10 mg by mouth daily.        . clotrimazole-betamethasone (LOTRISONE) cream Apply 1 application topically 2 (two) times daily as needed. For skin irritation      . cyanocobalamin 1000 MCG/ML injection Inject into the muscle every 30 (thirty) days.        Marland Kitchen desoximetasone (TOPICORT) 0.25 % cream Apply 1 application topically 2 (two) times daily as needed. For rash      . esomeprazole (NEXIUM) 40 MG capsule Take 1 capsule (40 mg total) by mouth daily.  30 capsule  11  . finasteride  (PROSCAR) 5 MG tablet Take 5 mg by mouth daily.        . furosemide (LASIX) 40 MG tablet Take 1 tablet (40 mg total) by mouth daily.  30 tablet  3  . guaiFENesin (MUCINEX) 600 MG 12 hr tablet Take 1,200 mg by mouth 2 (two) times daily as needed. For congestion      . guaiFENesin-dextromethorphan (ROBITUSSIN DM) 100-10 MG/5ML syrup Take 5 mLs by mouth 3 (three) times daily as needed. As needed for cough      . ipratropium (ATROVENT) 0.03 % nasal spray Two sprays three times a day as needed before meals      . isosorbide mononitrate (IMDUR) 60 MG 24 hr tablet Take 1 tablet (60 mg total) by mouth every morning.  30 tablet  11  . KLOR-CON 10 10 MEQ tablet TAKE 1 TABLET BY MOUTH EVERY DAY  30 tablet  6  . levalbuterol (XOPENEX HFA) 45 MCG/ACT inhaler Inhale 1-2 puffs into the lungs every 4 (four) hours as needed. For shortness of breath  1 Inhaler  6  . levothyroxine (SYNTHROID, LEVOTHROID) 125 MCG tablet Take 125 mcg by mouth daily. Take a whole tablet Monday, Wednesday, Fridays. Take a a 1/2 tablet on Sundays, Tuesdays, Thursdays, and Saturdays.      Marland Kitchen  lithium carbonate (LITHOBID) 300 MG CR tablet Take 300 mg by mouth 2 (two) times daily.      . nitroGLYCERIN (NITROSTAT) 0.3 MG SL tablet Place 0.3 mg under the tongue every 5 (five) minutes as needed. For chest pain      . NON FORMULARY 2 Act daily. Flutter Valve.       . NON FORMULARY as directed. Home nebulizer.       Marland Kitchen olmesartan (BENICAR) 5 MG tablet Take 1 tablet (5 mg total) by mouth daily.  30 tablet  5  . rosuvastatin (CRESTOR) 5 MG tablet Take 5 mg by mouth once a week. "Sundays      . sitaGLIPtin (JANUVIA) 100 MG tablet Take 1 tablet (100 mg total) by mouth daily.  30 tablet  3  . Tamsulosin HCl (FLOMAX) 0.4 MG CAPS Take 0.4 mg by mouth daily.        . tiotropium (SPIRIVA) 18 MCG inhalation capsule Place 1 capsule (18 mcg total) into inhaler and inhale daily.  30 capsule  5  . warfarin (COUMADIN) 5 MG tablet TAKE AS DIRECTED PER COUMADIN  CLINIC  30 tablet  3    Allergies  Allergen Reactions  . Atorvastatin     REACTION: muscle ache in legs  . Crestor (Rosuvastatin Calcium) Other (See Comments)    Lower ext fatigue and soreness- if taken everyday  . Lisinopril Diarrhea  . Morphine     REACTION: AMS - agitation  . Prednisone     REACTION: Can't breath  . Zolpidem Tartrate     REACTION: AMS    Past Medical History  Diagnosis Date  . CAD (coronary artery disease)     post CABG with prior percutaneous intervention of teh saphenous vein graft with known saphenous vein graft disease.  . AAA (abdominal aortic aneurysm) 2010    5.8cm  . Aneurysm of thoracic aorta 1995    Rupture 1995 w/spontaneous resolution  . Popliteal artery aneurysm   . Myocardial infarction   . Ischemic cardiomyopathy   . Paroxysmal atrial fibrillation   . Hypertension   . Peripheral vascular disease   . Dyslipidemia   . Diabetes mellitus 2007    dx'd 2007 w/persisting FBS>125  . GERD (gastroesophageal reflux disease)   . Hiatal hernia   . Diverticular disease   . BPH (benign prostatic hypertrophy)     with hx of acute urinary retention (pt can self-cath)  . COPD (chronic obstructive pulmonary disease)     12" /28/09 PFT FEV1 1.84 (55%), FVC 4.42 (95%), TLC 6.46 (92%), DLCO 72%, no BD response  . Pneumonia 1/09  . Allergic rhinitis   . Shingles     X 2 episodes, both in left scapula area  . OSA (obstructive sleep apnea) 06/14/09    PSG RDI 17, PLMI 96, intolerant of CPAP or BPAP  . Chronic renal insufficiency     Baseline Cr 1.5  . Prostate cancer 07/2008    Low grade; watchful waiting with [name]  . Pulmonary nodule, left 03/02/2010    Left base; resolved on f/u CT  . Allergic rhinitis   . Carpal tunnel syndrome of left wrist 02/11/2011  . IMPLANTATION OF DEFIBRILLATOR, HX OF 09/02/2008  . IRON DEFICIENCY 06/08/2010    Hemoccult negative:  GI ([name]) recommended no invasive diagnostics--watchful waiting (2012).  .  UNSPECIFIED ANEMIA 06/01/2010    Multifactorial: iron def, CRI, vit B12 def,     Past Surgical History  Procedure Date  .  Cardiac defibrillator placement 08/28/2004    [name]  . Colon surgery 1995    partial colectomy  . Coronary artery bypass graft 1996  . Aorta - bilateral femoral artery bypass graft 1999    [name] Early  . Nose surgery   . Insertion of pacing lead     New rate sensing pacing lead with removal of a previous implanted ICD and insertion of a device back in the pocket with defibrillation threshold testing  . Abdominal aortic aneurysm repair 1999    Ingrarenal repair/ graft  . Stents     Placed to the saphenous vein graft  . Radiofrequency ablation 2006    A flutter    Family History  Problem Relation Age of Onset  . Stroke Mother 71  . Heart attack Mother     CVA  . Cancer Father 40    Lung  . Heart attack Father 88  . Lung cancer Father   . Coronary artery disease Other     2 of 5 siblings with CAD    History   Social History  . Marital Status: Married    Spouse Name: N/A    Number of Children: N/A  . Years of Education: N/A   Occupational History  . RETIRED     Mechanic   Social History Main Topics  . Smoking status: Former Smoker -- 1.0 packs/day for 30 years    Types: Cigarettes    Quit date: 05/23/2003  . Smokeless tobacco: Former Neurosurgeon    Types: Chew    Quit date: 05/14/2003   Comment: 80 pack a year history  . Alcohol Use: 0.6 oz/week    1 Cans of beer per week     only occ  . Drug Use: No  . Sexually Active: Not on file   Other Topics Concern  . Not on file   Social History Narrative   Lives in [place] with his wife.Very involved in his church.Enjoys bluegrass music, going to [employer], spending time with extended family.Used to play golf twice a week up until 2012.No alc/drug use.Former smoker, quit 2005.    ROS: Please see the HPI.  All other systems reviewed and negative.  PHYSICAL EXAM:  BP 103/63   Pulse 53  Ht 5' 11.5" (1.816 m)  Wt 165 lb 12.8 oz (75.206 kg)  BMI 22.80 kg/m2  General: Well developed, well nourished, in no acute distress. Head:  Normocephalic and atraumatic. Neck: no JVD Lungs: Prolonged expiration.  Heart: Normal S1 and S2.  Soft apical murmur.    Abdomen:  Normal bowel sounds; soft; non tender; no organomegaly Pulses: Pulses normal in all 4 extremities. Extremities: No clubbing or cyanosis. No edema. Neurologic: Alert and oriented x 3.  EKG:  NSR.  Delay in R wave progression.  T inversion in V4-6, slightly more prominent than last tracing.    ASSESSMENT AND PLAN:

## 2011-12-09 NOTE — Assessment & Plan Note (Signed)
The patient has some T-wave inversion which is slightly more prominent. He's had virtually no symptoms. In reviewing his last catheterization 2011, his obtuse marginal graft was occluded, and his native vasculature was also and not ideal. Continued medical therapy was recommended. I would not make any changes at the present time as he is symptomatically stable. We will continue to follow clinic

## 2011-12-09 NOTE — Assessment & Plan Note (Signed)
The patient rem he also remains on Coumadin anticoagulation. ains on a fairly low dose amiodarone, and appears to be maintaining normal sinus rhythm

## 2011-12-09 NOTE — Patient Instructions (Signed)
Your physician recommends that you schedule a follow-up appointment in: 2-3 MONTHS with Dr Riley Kill  Your physician recommends that you continue on your current medications as directed. Please refer to the Current Medication list given to you today.

## 2011-12-09 NOTE — Assessment & Plan Note (Signed)
Patient's ejection fraction is improved, and he remained stable his current medical regimen.

## 2011-12-18 ENCOUNTER — Ambulatory Visit (INDEPENDENT_AMBULATORY_CARE_PROVIDER_SITE_OTHER): Payer: Medicare Other | Admitting: *Deleted

## 2011-12-18 DIAGNOSIS — Z7901 Long term (current) use of anticoagulants: Secondary | ICD-10-CM

## 2011-12-18 DIAGNOSIS — I4891 Unspecified atrial fibrillation: Secondary | ICD-10-CM

## 2012-01-15 ENCOUNTER — Encounter: Payer: Self-pay | Admitting: Family Medicine

## 2012-01-15 ENCOUNTER — Ambulatory Visit (INDEPENDENT_AMBULATORY_CARE_PROVIDER_SITE_OTHER): Payer: Medicare Other | Admitting: Family Medicine

## 2012-01-15 VITALS — BP 120/69 | HR 73 | Temp 97.2°F | Ht 70.5 in | Wt 165.0 lb

## 2012-01-15 DIAGNOSIS — B9789 Other viral agents as the cause of diseases classified elsewhere: Secondary | ICD-10-CM

## 2012-01-15 DIAGNOSIS — B349 Viral infection, unspecified: Secondary | ICD-10-CM | POA: Insufficient documentation

## 2012-01-15 DIAGNOSIS — I4891 Unspecified atrial fibrillation: Secondary | ICD-10-CM

## 2012-01-15 DIAGNOSIS — R5383 Other fatigue: Secondary | ICD-10-CM

## 2012-01-15 DIAGNOSIS — E538 Deficiency of other specified B group vitamins: Secondary | ICD-10-CM

## 2012-01-15 LAB — CBC WITH DIFFERENTIAL/PLATELET
Eosinophils Relative: 1.3 % (ref 0.0–5.0)
HCT: 36.8 % — ABNORMAL LOW (ref 39.0–52.0)
Lymphs Abs: 1 10*3/uL (ref 0.7–4.0)
MCV: 91.2 fl (ref 78.0–100.0)
Monocytes Absolute: 0.4 10*3/uL (ref 0.1–1.0)
Platelets: 145 10*3/uL — ABNORMAL LOW (ref 150.0–400.0)
WBC: 7.1 10*3/uL (ref 4.5–10.5)

## 2012-01-15 LAB — COMPREHENSIVE METABOLIC PANEL
ALT: 15 U/L (ref 0–53)
CO2: 29 mEq/L (ref 19–32)
Creatinine, Ser: 1.4 mg/dL (ref 0.4–1.5)
GFR: 52.68 mL/min — ABNORMAL LOW (ref >60.00)
Glucose, Bld: 92 mg/dL (ref 70–99)
Total Bilirubin: 0.6 mg/dL (ref 0.3–1.2)

## 2012-01-15 LAB — VITAMIN B12: Vitamin B-12: >1500 pg/mL — ABNORMAL HIGH (ref 211–911)

## 2012-01-15 LAB — PROTIME-INR: INR: 2.97 — ABNORMAL HIGH (ref <1.50)

## 2012-01-15 LAB — TSH: TSH: 0.42 u[IU]/mL (ref 0.35–5.50)

## 2012-01-15 MED ORDER — PREDNISONE 10 MG PO TABS: ORAL_TABLET | ORAL | Status: DC

## 2012-01-15 MED ORDER — CYANOCOBALAMIN 1000 MCG/ML IJ SOLN: 1000.0000 ug | Freq: Once | INTRAMUSCULAR | Status: DC

## 2012-01-15 NOTE — Assessment & Plan Note (Addendum)
With subtle symptoms of mild COPD exacerbation. Will proceed with prednisone 10mg  bid x 3d, then 10mg  qd x 3d, then 5mg  qd x 2d. He'll continue all of his other current meds. I will also check CBC, CMET, TSH, vit B12 level, and PT/INR today. Also, gave 1000 mcg vit B12 IM today.

## 2012-01-15 NOTE — Progress Notes (Signed)
OFFICE NOTE  01/15/2012  CC:  Chief Complaint  Patient presents with  . acute    not feeling well, back and legs hurt, tired and weak x 2-3 weeks     HPI: Patient is a 76 y.o. Caucasian male who is here for "I just don't feel good". Insidious onset over the last 2-3 weeks, feels tired more easily with moving around, back and legs hurt more lately--roving pains, nothing persistent or severe in one region or another.  Admits to only slight/occasional cough, denies wheezing, admits to feeling winded easier with moving around.  Occasional chest and upper abdominal discomfort that is random--not exertionally related or positionally related.  Has had some frontal headaches, without any acute vision complaints.  No nasal congestion/runny nose/sneezing or ST.  He has had chronic gravelly voice changes.  Denies change in mood, denies urinary urgency, frequency, dysuria, or hematuria.  Has a BM qod that he says feels like a decent evacuation. No n/v/abd pain, no rash.  No dizziness, no focal weakness.  Pertinent PMH:  Past Medical History  Diagnosis Date  . CAD (coronary artery disease)     post CABG with prior percutaneous intervention of teh saphenous vein graft with known saphenous vein graft disease.  Marland Kitchen AAA (abdominal aortic aneurysm) 2010    5.8cm  . Aneurysm of thoracic aorta 1995    Rupture 1995 w/spontaneous resolution  . Popliteal artery aneurysm   . Myocardial infarction   . Ischemic cardiomyopathy   . Paroxysmal atrial fibrillation   . Hypertension   . Peripheral vascular disease   . Dyslipidemia   . Diabetes mellitus 2007    dx'd 2007 w/persisting FBS>125  . GERD (gastroesophageal reflux disease)   . Hiatal hernia   . Diverticular disease   . BPH (benign prostatic hypertrophy)     with hx of acute urinary retention (pt can self-cath)  . COPD (chronic obstructive pulmonary disease)     05/09/08 PFT FEV1 1.84 (55%), FVC 4.42 (95%), TLC 6.46 (92%), DLCO 72%, no BD response  .  Pneumonia 1/09  . Allergic rhinitis   . Shingles     X 2 episodes, both in left scapula area  . OSA (obstructive sleep apnea) 06/14/09    PSG RDI 17, PLMI 96, intolerant of CPAP or BPAP  . Chronic renal insufficiency     Baseline Cr 1.5  . Prostate cancer 07/2008    Low grade; watchful waiting with [name]  . Pulmonary nodule, left 03/02/2010    Left base; resolved on f/u CT  . Allergic rhinitis   . Carpal tunnel syndrome of left wrist 02/11/2011  . IMPLANTATION OF DEFIBRILLATOR, HX OF 09/02/2008  . IRON DEFICIENCY 06/08/2010    Hemoccult negative:  GI ([name]) recommended no invasive diagnostics--watchful waiting (2012).  . UNSPECIFIED ANEMIA 06/01/2010    Multifactorial: iron def, CRI, vit B12 def,    Past surgical, social, and family history reviewed and no changes noted since last office visit.  MEDS:  Outpatient Prescriptions Prior to Visit  Medication Sig Dispense Refill  . acetaminophen (TYLENOL) 325 MG tablet Take 325 mg by mouth every 6 (six) hours as needed. For pain      . albuterol (PROVENTIL) (2.5 MG/3ML) 0.083% nebulizer solution Take 2.5 mg by nebulization every 6 (six) hours as needed. For shortness of breath      . amiodarone (PACERONE) 200 MG tablet Take 200 mg by mouth daily.       Marland Kitchen aspirin 81 MG tablet  Take 81 mg by mouth daily.        . budesonide (PULMICORT) 0.25 MG/2ML nebulizer solution Take 2 mLs (0.25 mg total) by nebulization 2 (two) times daily.  60 mL  5  . budesonide (RHINOCORT AQUA) 32 MCG/ACT nasal spray Place 2 sprays into the nose daily.  1 Bottle  12  . carvedilol (COREG) 3.125 MG tablet Take 1 tablet (3.125 mg total) by mouth 2 (two) times daily.  60 tablet  11  . cetirizine (ZYRTEC) 10 MG tablet Take 10 mg by mouth daily.        . clotrimazole-betamethasone (LOTRISONE) cream Apply 1 application topically 2 (two) times daily as needed. For skin irritation      . cyanocobalamin 1000 MCG/ML injection Inject into the muscle every 30 (thirty)  days.        Marland Kitchen desoximetasone (TOPICORT) 0.25 % cream Apply 1 application topically 2 (two) times daily as needed. For rash      . esomeprazole (NEXIUM) 40 MG capsule Take 1 capsule (40 mg total) by mouth daily.  30 capsule  11  . finasteride (PROSCAR) 5 MG tablet Take 5 mg by mouth daily.        . furosemide (LASIX) 40 MG tablet Take 1 tablet (40 mg total) by mouth daily.  30 tablet  3  . guaiFENesin (MUCINEX) 600 MG 12 hr tablet Take 1,200 mg by mouth 2 (two) times daily as needed. For congestion      . guaiFENesin-dextromethorphan (ROBITUSSIN DM) 100-10 MG/5ML syrup Take 5 mLs by mouth 3 (three) times daily as needed. As needed for cough      . ipratropium (ATROVENT) 0.03 % nasal spray Two sprays three times a day as needed before meals      . isosorbide mononitrate (IMDUR) 60 MG 24 hr tablet Take 1 tablet (60 mg total) by mouth every morning.  30 tablet  11  . KLOR-CON 10 10 MEQ tablet TAKE 1 TABLET BY MOUTH EVERY DAY  30 tablet  6  . levalbuterol (XOPENEX HFA) 45 MCG/ACT inhaler Inhale 1-2 puffs into the lungs every 4 (four) hours as needed. For shortness of breath  1 Inhaler  6  . levothyroxine (SYNTHROID, LEVOTHROID) 125 MCG tablet Take 125 mcg by mouth daily. Take a whole tablet Monday, Wednesday, Fridays. Take a a 1/2 tablet on Sundays, Tuesdays, Thursdays, and Saturdays.      Marland Kitchen lithium carbonate (LITHOBID) 300 MG CR tablet Take 300 mg by mouth 2 (two) times daily.      . nitroGLYCERIN (NITROSTAT) 0.3 MG SL tablet Place 0.3 mg under the tongue every 5 (five) minutes as needed. For chest pain      . NON FORMULARY 2 Act daily. Flutter Valve.       . NON FORMULARY as directed. Home nebulizer.       Marland Kitchen olmesartan (BENICAR) 5 MG tablet Take 1 tablet (5 mg total) by mouth daily.  30 tablet  5  . rosuvastatin (CRESTOR) 5 MG tablet Take 5 mg by mouth once a week. Sundays      . sitaGLIPtin (JANUVIA) 100 MG tablet Take 1 tablet (100 mg total) by mouth daily.  30 tablet  3  . Tamsulosin HCl  (FLOMAX) 0.4 MG CAPS Take 0.4 mg by mouth daily.        Marland Kitchen tiotropium (SPIRIVA) 18 MCG inhalation capsule Place 1 capsule (18 mcg total) into inhaler and inhale daily.  30 capsule  5  . warfarin (COUMADIN) 5  MG tablet TAKE AS DIRECTED PER COUMADIN CLINIC  30 tablet  3   No facility-administered medications prior to visit.    PE: Blood pressure 120/69, pulse 73, temperature 97.2 F (36.2 C), temperature source Temporal, height 5' 10.5" (1.791 m), weight 165 lb (74.844 kg), SpO2 95.00%. Gen: Alert, well appearing.  Patient is oriented to person, place, time, and situation. ENT: Ears: EACs clear, normal epithelium.  TMs with good light reflex and landmarks bilaterally.  Eyes: no injection, icteris, swelling, or exudate.  EOMI, PERRLA. Nose: no drainage or turbinate edema/swelling.  No injection or focal lesion.  Mouth: lips without lesion/swelling.  Oral mucosa pink and moist.  Dentition intact and without obvious caries or gingival swelling.  Oropharynx without erythema, exudate, or swelling.  Neck - No masses or thyromegaly or limitation in range of motion CV: RRR, rare ectopic beat.  No rub. LUNGS: trace exp wheezing diffusely in posterior lung fields, aeration is good.   ABD: soft, NT, ND, BS normal.  No hepatospenomegaly or mass.  No bruits. EXT: no clubbing, cyanosis, or edema.   Labs: none today  IMPRESSION AND PLAN:  Viral syndrome With subtle symptoms of mild COPD exacerbation. Will proceed with prednisone 10mg  bid x 3d, then 10mg  qd x 3d, then 5mg  qd x 2d. He'll continue all of his other current meds. I will also check CBC, CMET, TSH, vit B12 level, and PT/INR today. Also, gave 1000 mcg vit B12 IM today.     FOLLOW UP: 2 wks

## 2012-01-16 ENCOUNTER — Telehealth: Payer: Self-pay | Admitting: Family Medicine

## 2012-01-16 ENCOUNTER — Ambulatory Visit (INDEPENDENT_AMBULATORY_CARE_PROVIDER_SITE_OTHER): Payer: Medicare Other | Admitting: Cardiovascular Disease

## 2012-01-16 DIAGNOSIS — I4891 Unspecified atrial fibrillation: Secondary | ICD-10-CM

## 2012-01-16 DIAGNOSIS — Z7901 Long term (current) use of anticoagulants: Secondary | ICD-10-CM

## 2012-01-16 NOTE — Telephone Encounter (Signed)
Notified in result note.

## 2012-01-19 ENCOUNTER — Inpatient Hospital Stay (HOSPITAL_COMMUNITY)
Admission: EM | Admit: 2012-01-19 | Discharge: 2012-01-20 | DRG: 281 | Disposition: A | Discharge status: Home / Self Care | Payer: Medicare Other | Attending: Internal Medicine | Admitting: Internal Medicine

## 2012-01-19 ENCOUNTER — Encounter (HOSPITAL_COMMUNITY): Payer: Self-pay | Admitting: *Deleted

## 2012-01-19 ENCOUNTER — Emergency Department (HOSPITAL_COMMUNITY): Payer: Medicare Other

## 2012-01-19 DIAGNOSIS — I2589 Other forms of chronic ischemic heart disease: Secondary | ICD-10-CM | POA: Diagnosis present

## 2012-01-19 DIAGNOSIS — I10 Essential (primary) hypertension: Secondary | ICD-10-CM | POA: Diagnosis present

## 2012-01-19 DIAGNOSIS — I252 Old myocardial infarction: Secondary | ICD-10-CM

## 2012-01-19 DIAGNOSIS — K219 Gastro-esophageal reflux disease without esophagitis: Secondary | ICD-10-CM | POA: Diagnosis present

## 2012-01-19 DIAGNOSIS — I714 Abdominal aortic aneurysm, without rupture: Secondary | ICD-10-CM

## 2012-01-19 DIAGNOSIS — G4733 Obstructive sleep apnea (adult) (pediatric): Secondary | ICD-10-CM | POA: Diagnosis present

## 2012-01-19 DIAGNOSIS — E039 Hypothyroidism, unspecified: Secondary | ICD-10-CM | POA: Diagnosis present

## 2012-01-19 DIAGNOSIS — I2510 Atherosclerotic heart disease of native coronary artery without angina pectoris: Secondary | ICD-10-CM | POA: Diagnosis present

## 2012-01-19 DIAGNOSIS — Z87891 Personal history of nicotine dependence: Secondary | ICD-10-CM

## 2012-01-19 DIAGNOSIS — E119 Type 2 diabetes mellitus without complications: Secondary | ICD-10-CM | POA: Diagnosis present

## 2012-01-19 DIAGNOSIS — I200 Unstable angina: Secondary | ICD-10-CM

## 2012-01-19 DIAGNOSIS — N182 Chronic kidney disease, stage 2 (mild): Secondary | ICD-10-CM

## 2012-01-19 DIAGNOSIS — Z7982 Long term (current) use of aspirin: Secondary | ICD-10-CM

## 2012-01-19 DIAGNOSIS — K449 Diaphragmatic hernia without obstruction or gangrene: Secondary | ICD-10-CM | POA: Diagnosis present

## 2012-01-19 DIAGNOSIS — N189 Chronic kidney disease, unspecified: Secondary | ICD-10-CM | POA: Diagnosis present

## 2012-01-19 DIAGNOSIS — R079 Chest pain, unspecified: Secondary | ICD-10-CM

## 2012-01-19 DIAGNOSIS — Z95810 Presence of automatic (implantable) cardiac defibrillator: Secondary | ICD-10-CM

## 2012-01-19 DIAGNOSIS — J441 Chronic obstructive pulmonary disease with (acute) exacerbation: Secondary | ICD-10-CM | POA: Diagnosis present

## 2012-01-19 DIAGNOSIS — Z7901 Long term (current) use of anticoagulants: Secondary | ICD-10-CM

## 2012-01-19 DIAGNOSIS — I4892 Unspecified atrial flutter: Secondary | ICD-10-CM | POA: Diagnosis present

## 2012-01-19 DIAGNOSIS — I129 Hypertensive chronic kidney disease with stage 1 through stage 4 chronic kidney disease, or unspecified chronic kidney disease: Secondary | ICD-10-CM | POA: Diagnosis present

## 2012-01-19 DIAGNOSIS — N179 Acute kidney failure, unspecified: Secondary | ICD-10-CM | POA: Diagnosis present

## 2012-01-19 DIAGNOSIS — I4891 Unspecified atrial fibrillation: Secondary | ICD-10-CM | POA: Diagnosis present

## 2012-01-19 DIAGNOSIS — I25810 Atherosclerosis of coronary artery bypass graft(s) without angina pectoris: Secondary | ICD-10-CM | POA: Diagnosis present

## 2012-01-19 DIAGNOSIS — I2582 Chronic total occlusion of coronary artery: Secondary | ICD-10-CM | POA: Diagnosis present

## 2012-01-19 DIAGNOSIS — I249 Acute ischemic heart disease, unspecified: Secondary | ICD-10-CM

## 2012-01-19 DIAGNOSIS — I214 Non-ST elevation (NSTEMI) myocardial infarction: Principal | ICD-10-CM | POA: Diagnosis present

## 2012-01-19 DIAGNOSIS — Z9861 Coronary angioplasty status: Secondary | ICD-10-CM

## 2012-01-19 DIAGNOSIS — J449 Chronic obstructive pulmonary disease, unspecified: Secondary | ICD-10-CM

## 2012-01-19 DIAGNOSIS — N400 Enlarged prostate without lower urinary tract symptoms: Secondary | ICD-10-CM | POA: Diagnosis present

## 2012-01-19 DIAGNOSIS — Z79899 Other long term (current) drug therapy: Secondary | ICD-10-CM

## 2012-01-19 DIAGNOSIS — D509 Iron deficiency anemia, unspecified: Secondary | ICD-10-CM | POA: Diagnosis present

## 2012-01-19 DIAGNOSIS — E785 Hyperlipidemia, unspecified: Secondary | ICD-10-CM | POA: Diagnosis present

## 2012-01-19 DIAGNOSIS — I428 Other cardiomyopathies: Secondary | ICD-10-CM

## 2012-01-19 DIAGNOSIS — C61 Malignant neoplasm of prostate: Secondary | ICD-10-CM | POA: Diagnosis present

## 2012-01-19 DIAGNOSIS — F319 Bipolar disorder, unspecified: Secondary | ICD-10-CM | POA: Diagnosis present

## 2012-01-19 DIAGNOSIS — I739 Peripheral vascular disease, unspecified: Secondary | ICD-10-CM | POA: Diagnosis present

## 2012-01-19 LAB — POCT I-STAT TROPONIN I: Troponin i, poc: 0.09 ng/mL (ref 0.00–0.08)

## 2012-01-19 LAB — CBC
HCT: 38.1 % — ABNORMAL LOW (ref 39.0–52.0)
Hemoglobin: 12.1 g/dL — ABNORMAL LOW (ref 13.0–17.0)
MCH: 29.7 pg (ref 26.0–34.0)
MCHC: 31.8 g/dL (ref 30.0–36.0)
RBC: 4.07 MIL/uL — ABNORMAL LOW (ref 4.22–5.81)

## 2012-01-19 LAB — CK TOTAL AND CKMB (NOT AT ARMC)
CK, MB: 3.8 ng/mL (ref 0.3–4.0)
Total CK: 69 U/L (ref 7–232)

## 2012-01-19 LAB — PROTIME-INR
INR: 2.81 — ABNORMAL HIGH (ref 0.00–1.49)
Prothrombin Time: 30 seconds — ABNORMAL HIGH (ref 11.6–15.2)

## 2012-01-19 LAB — BASIC METABOLIC PANEL
BUN: 33 mg/dL — ABNORMAL HIGH (ref 6–23)
CO2: 28 mEq/L (ref 19–32)
Calcium: 9.8 mg/dL (ref 8.4–10.5)
GFR calc non Af Amer: 39 mL/min — ABNORMAL LOW (ref >90)
Glucose, Bld: 94 mg/dL (ref 70–99)

## 2012-01-19 LAB — GLUCOSE, CAPILLARY: Glucose-Capillary: 142 mg/dL — ABNORMAL HIGH (ref 70–99)

## 2012-01-19 LAB — TROPONIN I: Troponin I: 0.32 ng/mL (ref <0.30)

## 2012-01-19 MED ORDER — ASPIRIN EC 81 MG PO TBEC
81.0000 mg | DELAYED_RELEASE_TABLET | Freq: Every day | ORAL | Status: DC
  Administered 2012-01-20: 81 mg via ORAL
  Filled 2012-01-19: qty 1

## 2012-01-19 MED ORDER — WARFARIN - PHARMACIST DOSING INPATIENT: Freq: Every day | Status: DC

## 2012-01-19 MED ORDER — ALBUTEROL SULFATE (5 MG/ML) 0.5% IN NEBU: 2.5000 mg | INHALATION_SOLUTION | RESPIRATORY_TRACT | Status: DC | PRN

## 2012-01-19 MED ORDER — FLUTICASONE PROPIONATE 50 MCG/ACT NA SUSP
1.0000 | Freq: Every day | NASAL | Status: DC
  Administered 2012-01-20: 1 via NASAL
  Filled 2012-01-19 (×2): qty 16

## 2012-01-19 MED ORDER — ONDANSETRON HCL 4 MG/2ML IJ SOLN: 4.0000 mg | Freq: Four times a day (QID) | INTRAMUSCULAR | Status: DC | PRN

## 2012-01-19 MED ORDER — AMLODIPINE BESYLATE 5 MG PO TABS
5.0000 mg | ORAL_TABLET | Freq: Every day | ORAL | Status: DC
  Administered 2012-01-19 – 2012-01-20 (×2): 5 mg via ORAL
  Filled 2012-01-19 (×2): qty 1

## 2012-01-19 MED ORDER — LEVOTHYROXINE SODIUM 125 MCG PO TABS
125.0000 ug | ORAL_TABLET | Freq: Every day | ORAL | Status: DC
  Filled 2012-01-19 (×2): qty 1

## 2012-01-19 MED ORDER — TIOTROPIUM BROMIDE MONOHYDRATE 18 MCG IN CAPS
18.0000 ug | ORAL_CAPSULE | Freq: Every day | RESPIRATORY_TRACT | Status: DC
  Administered 2012-01-20: 18 ug via RESPIRATORY_TRACT
  Filled 2012-01-19: qty 5

## 2012-01-19 MED ORDER — IPRATROPIUM BROMIDE 0.02 % IN SOLN
0.5000 mg | Freq: Four times a day (QID) | RESPIRATORY_TRACT | Status: DC
  Administered 2012-01-19 – 2012-01-20 (×4): 0.5 mg via RESPIRATORY_TRACT
  Filled 2012-01-19 (×6): qty 2.5

## 2012-01-19 MED ORDER — BUDESONIDE 0.25 MG/2ML IN SUSP
0.2500 mg | Freq: Two times a day (BID) | RESPIRATORY_TRACT | Status: DC
  Administered 2012-01-19: 0.25 mg via RESPIRATORY_TRACT
  Filled 2012-01-19 (×5): qty 2

## 2012-01-19 MED ORDER — METHYLPREDNISOLONE SODIUM SUCC 125 MG IJ SOLR
60.0000 mg | Freq: Four times a day (QID) | INTRAMUSCULAR | Status: DC
  Administered 2012-01-19 – 2012-01-20 (×6): 60 mg via INTRAVENOUS
  Filled 2012-01-19: qty 0.96
  Filled 2012-01-19 (×2): qty 2
  Filled 2012-01-19 (×2): qty 0.96
  Filled 2012-01-19: qty 2
  Filled 2012-01-19 (×3): qty 0.96

## 2012-01-19 MED ORDER — ACETAMINOPHEN 325 MG PO TABS
325.0000 mg | ORAL_TABLET | Freq: Four times a day (QID) | ORAL | Status: DC | PRN
  Administered 2012-01-19: 325 mg via ORAL
  Filled 2012-01-19 (×2): qty 1

## 2012-01-19 MED ORDER — ACETAMINOPHEN 325 MG PO TABS
650.0000 mg | ORAL_TABLET | Freq: Four times a day (QID) | ORAL | Status: DC | PRN
  Administered 2012-01-19: 325 mg via ORAL
  Administered 2012-01-20: 650 mg via ORAL
  Filled 2012-01-19: qty 1
  Filled 2012-01-19: qty 2

## 2012-01-19 MED ORDER — ALUM & MAG HYDROXIDE-SIMETH 200-200-20 MG/5ML PO SUSP: 30.0000 mL | Freq: Four times a day (QID) | ORAL | Status: DC | PRN

## 2012-01-19 MED ORDER — CLOTRIMAZOLE 1 % EX CREA
TOPICAL_CREAM | Freq: Two times a day (BID) | CUTANEOUS | Status: DC
  Administered 2012-01-20: 10:00:00 via TOPICAL
  Filled 2012-01-19: qty 15

## 2012-01-19 MED ORDER — LEVOTHYROXINE SODIUM 125 MCG PO TABS
125.0000 ug | ORAL_TABLET | Freq: Every day | ORAL | Status: DC
  Administered 2012-01-19 – 2012-01-20 (×2): 125 ug via ORAL
  Filled 2012-01-19 (×3): qty 1

## 2012-01-19 MED ORDER — CARVEDILOL 6.25 MG PO TABS
6.2500 mg | ORAL_TABLET | Freq: Two times a day (BID) | ORAL | Status: DC
  Administered 2012-01-19 – 2012-01-20 (×2): 6.25 mg via ORAL
  Filled 2012-01-19 (×4): qty 1

## 2012-01-19 MED ORDER — AMIODARONE HCL 200 MG PO TABS
200.0000 mg | ORAL_TABLET | Freq: Every day | ORAL | Status: DC
  Administered 2012-01-19 – 2012-01-20 (×2): 200 mg via ORAL
  Filled 2012-01-19 (×2): qty 1

## 2012-01-19 MED ORDER — SODIUM CHLORIDE 0.9 % IJ SOLN
3.0000 mL | Freq: Two times a day (BID) | INTRAMUSCULAR | Status: DC
  Administered 2012-01-19 – 2012-01-20 (×2): 3 mL via INTRAVENOUS

## 2012-01-19 MED ORDER — TAMSULOSIN HCL 0.4 MG PO CAPS
0.4000 mg | ORAL_CAPSULE | Freq: Every day | ORAL | Status: DC
  Administered 2012-01-19 – 2012-01-20 (×2): 0.4 mg via ORAL
  Filled 2012-01-19 (×2): qty 1

## 2012-01-19 MED ORDER — LORATADINE 10 MG PO TABS
10.0000 mg | ORAL_TABLET | Freq: Every day | ORAL | Status: DC
  Administered 2012-01-19: 10 mg via ORAL
  Filled 2012-01-19 (×2): qty 1

## 2012-01-19 MED ORDER — GUAIFENESIN ER 600 MG PO TB12
1200.0000 mg | ORAL_TABLET | Freq: Two times a day (BID) | ORAL | Status: DC
  Administered 2012-01-19 (×2): 1200 mg via ORAL
  Filled 2012-01-19 (×5): qty 2

## 2012-01-19 MED ORDER — LITHIUM CARBONATE ER 300 MG PO TBCR
300.0000 mg | EXTENDED_RELEASE_TABLET | Freq: Two times a day (BID) | ORAL | Status: DC
  Administered 2012-01-19 – 2012-01-20 (×3): 300 mg via ORAL
  Filled 2012-01-19 (×5): qty 1

## 2012-01-19 MED ORDER — ISOSORBIDE MONONITRATE ER 60 MG PO TB24
120.0000 mg | ORAL_TABLET | ORAL | Status: DC
  Administered 2012-01-20: 120 mg via ORAL
  Filled 2012-01-19 (×3): qty 2

## 2012-01-19 MED ORDER — ALBUTEROL SULFATE (5 MG/ML) 0.5% IN NEBU
2.5000 mg | INHALATION_SOLUTION | Freq: Four times a day (QID) | RESPIRATORY_TRACT | Status: DC
  Administered 2012-01-19 – 2012-01-20 (×4): 2.5 mg via RESPIRATORY_TRACT
  Filled 2012-01-19 (×6): qty 0.5

## 2012-01-19 MED ORDER — NITROGLYCERIN 0.3 MG SL SUBL
0.3000 mg | SUBLINGUAL_TABLET | SUBLINGUAL | Status: DC | PRN
  Filled 2012-01-19: qty 100

## 2012-01-19 MED ORDER — PANTOPRAZOLE SODIUM 40 MG PO TBEC
40.0000 mg | DELAYED_RELEASE_TABLET | Freq: Every day | ORAL | Status: DC
  Administered 2012-01-19 – 2012-01-20 (×2): 40 mg via ORAL
  Filled 2012-01-19: qty 1

## 2012-01-19 MED ORDER — INSULIN ASPART 100 UNIT/ML ~~LOC~~ SOLN
0.0000 [IU] | Freq: Three times a day (TID) | SUBCUTANEOUS | Status: DC
  Administered 2012-01-19: 17:00:00 via SUBCUTANEOUS
  Administered 2012-01-19 – 2012-01-20 (×3): 2 [IU] via SUBCUTANEOUS

## 2012-01-19 MED ORDER — ONDANSETRON HCL 4 MG PO TABS: 4.0000 mg | ORAL_TABLET | Freq: Four times a day (QID) | ORAL | Status: DC | PRN

## 2012-01-19 MED ORDER — SODIUM CHLORIDE 0.9 % IV SOLN: INTRAVENOUS | Status: DC

## 2012-01-19 MED ORDER — FINASTERIDE 5 MG PO TABS
5.0000 mg | ORAL_TABLET | Freq: Every day | ORAL | Status: DC
  Administered 2012-01-19 – 2012-01-20 (×2): 5 mg via ORAL
  Filled 2012-01-19 (×2): qty 1

## 2012-01-19 MED ORDER — CARVEDILOL 3.125 MG PO TABS
3.1250 mg | ORAL_TABLET | Freq: Two times a day (BID) | ORAL | Status: DC
  Administered 2012-01-19: 3.125 mg via ORAL
  Filled 2012-01-19 (×2): qty 1

## 2012-01-19 MED ORDER — LINAGLIPTIN 5 MG PO TABS
5.0000 mg | ORAL_TABLET | Freq: Every day | ORAL | Status: DC
  Administered 2012-01-19 – 2012-01-20 (×2): 5 mg via ORAL
  Filled 2012-01-19 (×2): qty 1

## 2012-01-19 MED ORDER — ISOSORBIDE MONONITRATE ER 60 MG PO TB24
60.0000 mg | ORAL_TABLET | ORAL | Status: DC
  Administered 2012-01-19: 60 mg via ORAL
  Filled 2012-01-19 (×2): qty 1

## 2012-01-19 NOTE — H&P (Addendum)
[facility]                                                                                    Patient Demographics  Roberto [name], is a 76 y.o. male  CSN: [id number]  MRN: [id number]  DOB - 05/15/1935  Admit Date - 01/19/2012  Outpatient Primary MD for the patient is Roberto Massed, MD   With History of -  Past Medical History  Diagnosis Date  . CAD (coronary artery disease)     post CABG with prior percutaneous intervention of teh saphenous vein graft with known saphenous vein graft disease.  Marland Kitchen AAA (abdominal aortic aneurysm) 2010    5.8cm  . Aneurysm of thoracic aorta 1995    Rupture 1995 w/spontaneous resolution  . Popliteal artery aneurysm   . Myocardial infarction   . Ischemic cardiomyopathy   . Paroxysmal atrial fibrillation   . Hypertension   . Peripheral vascular disease   . Dyslipidemia   . Diabetes mellitus 2007    dx'd 2007 w/persisting FBS>125  . GERD (gastroesophageal reflux disease)   . Hiatal hernia   . Diverticular disease   . BPH (benign prostatic hypertrophy)     with hx of acute urinary retention (pt can self-cath)  . COPD (chronic obstructive pulmonary disease)     05/09/08 PFT FEV1 1.84 (55%), FVC 4.42 (95%), TLC 6.46 (92%), DLCO 72%, no BD response  . Pneumonia 1/09  . Allergic rhinitis   . Shingles     X 2 episodes, both in left scapula area  . OSA (obstructive sleep apnea) 06/14/09    PSG RDI 17, PLMI 96, intolerant of CPAP or BPAP  . Chronic renal insufficiency     Baseline Cr 1.5  . Prostate cancer 07/2008    Low grade; watchful waiting with [name]  . Pulmonary nodule, left 03/02/2010    Left base; resolved on f/u CT  . Allergic rhinitis   . Carpal tunnel syndrome of left wrist 02/11/2011  . IMPLANTATION OF DEFIBRILLATOR, HX OF 09/02/2008  . IRON DEFICIENCY 06/08/2010    Hemoccult negative:  GI ([name]) recommended no invasive diagnostics--watchful waiting (2012).  . UNSPECIFIED ANEMIA 06/01/2010   Multifactorial: iron def, CRI, vit B12 def,       Past Surgical History  Procedure Date  . Cardiac defibrillator placement 08/28/2004    [name]  . Colon surgery 1995    partial colectomy  . Coronary artery bypass graft 1996  . Aorta - bilateral femoral artery bypass graft 1999    [name] Early  . Nose surgery   . Insertion of pacing lead     New rate sensing pacing lead with removal of a previous implanted ICD and insertion of a device back in the pocket with defibrillation threshold testing  . Abdominal aortic aneurysm repair 1999    Ingrarenal repair/ graft  . Stents     Placed to the saphenous vein graft  . Radiofrequency ablation 2006    A flutter    in for   Chief Complaint  Patient presents with  . Chest Pain     HPI  Roberto [name]  is  a 76 y.o. male, with significant past medical history of coronary artery disease status post CABG with cardiac cath in June 2011 showing two out of four patent grafts and ischemic cardiovascular ejection fraction 20-25%, history of axial flutter and fibrillation on chronic warfarin diabetes mellitus hyperlipidemia and COPD, presents with complaints of chest pain, patient reports chest pain started at rest that before going to sleep, as well he reports that it was accompanied by worsening shortness of breath, denies any palpitation, lightheadedness, nausea, diaphoresis, patient reports he took 4 baby aspirin at home, and the air he received care sublingual nitroglycerin by EMS, where he reports his chest pain has completely resolved, patient's first set of cardiac enzymes was negative, upon presentation to ED patient was found to have diffuse wheezing and decreased air entry, where he received multiple nebulizer treatments for COPD exacerbation, hospitalist service we are requested to admit the patient for his COPD exacerbation and cardiac rule out for his chest pain .    Review of Systems    In addition to the HPI above, No  Fever-chills, No Headache, No changes with Vision or hearing, No problems swallowing food or Liquids, Complaints of Chest pain, as well complaints of dry cough, no productive sputum, complaints of worsening shortness of breath No Abdominal pain, No Nausea or Vommitting, Bowel movements are regular, No Blood in stool or Urine, No dysuria, No new skin rashes or bruises, No new joints pains-aches,  No new weakness, tingling, numbness in any extremity, No recent weight gain or loss, No polyuria, polydypsia or polyphagia, No significant Mental Stressors.  A full 10 point Review of Systems was done, except as stated above, all other Review of Systems were negative.   Social History History  Substance Use Topics  . Smoking status: Former Smoker -- 1.0 packs/day for 30 years    Types: Cigarettes    Quit date: 05/23/2003  . Smokeless tobacco: Former Neurosurgeon    Types: Chew    Quit date: 05/14/2003   Comment: 80 pack a year history  . Alcohol Use: 0.6 oz/week    1 Cans of beer per week     only occ     Family History Family History  Problem Relation Age of Onset  . Stroke Mother 22  . Heart attack Mother     CVA  . Cancer Father 6    Lung  . Heart attack Father 9  . Lung cancer Father   . Coronary artery disease Other     2 of 5 siblings with CAD     Prior to Admission medications   Medication Sig Start Date End Date Taking? Authorizing Provider  acetaminophen (TYLENOL) 325 MG tablet Take 325 mg by mouth every 6 (six) hours as needed. For pain   Yes Historical Provider, MD  albuterol (PROVENTIL) (2.5 MG/3ML) 0.083% nebulizer solution Take 2.5 mg by nebulization every 6 (six) hours as needed. For shortness of breath   Yes Historical Provider, MD  amiodarone (PACERONE) 200 MG tablet Take 200 mg by mouth daily.  08/12/11  Yes Tonny Bollman, MD  aspirin 81 MG tablet Take 81 mg by mouth daily.     Yes Historical Provider, MD  budesonide (PULMICORT) 0.25 MG/2ML nebulizer solution  Take 2 mLs (0.25 mg total) by nebulization 2 (two) times daily. 08/07/11  Yes Coralyn Helling, MD  budesonide (RHINOCORT AQUA) 32 MCG/ACT nasal spray Place 2 sprays into the nose daily. 08/08/11 08/07/12 Yes Roberto Massed, MD  carvedilol (COREG) 3.125  MG tablet Take 1 tablet (3.125 mg total) by mouth 2 (two) times daily. 01/30/11  Yes Herby Abraham, MD  cetirizine (ZYRTEC) 10 MG tablet Take 10 mg by mouth daily.     Yes Historical Provider, MD  clotrimazole-betamethasone (LOTRISONE) cream Apply 1 application topically 2 (two) times daily as needed. For skin irritation   Yes Historical Provider, MD  cyanocobalamin 1000 MCG/ML injection Inject into the muscle every 30 (thirty) days.     Yes Historical Provider, MD  desoximetasone (TOPICORT) 0.25 % cream Apply 1 application topically 2 (two) times daily as needed. For rash   Yes Historical Provider, MD  esomeprazole (NEXIUM) 40 MG capsule Take 1 capsule (40 mg total) by mouth daily. 11/12/11 11/11/12 Yes Roberto Massed, MD  finasteride (PROSCAR) 5 MG tablet Take 5 mg by mouth daily.     Yes Historical Provider, MD  furosemide (LASIX) 40 MG tablet Take 1 tablet (40 mg total) by mouth daily. 11/15/11  Yes Roberto Massed, MD  guaiFENesin (MUCINEX) 600 MG 12 hr tablet Take 1,200 mg by mouth 2 (two) times daily as needed. For congestion   Yes Historical Provider, MD  guaiFENesin-dextromethorphan (ROBITUSSIN DM) 100-10 MG/5ML syrup Take 5 mLs by mouth 3 (three) times daily as needed. As needed for cough   Yes Historical Provider, MD  ipratropium (ATROVENT) 0.03 % nasal spray Two sprays three times a day as needed before meals 09/10/10 10/08/12 Yes Coralyn Helling, MD  isosorbide mononitrate (IMDUR) 60 MG 24 hr tablet Take 1 tablet (60 mg total) by mouth every morning. 08/13/11 08/12/12 Yes Herby Abraham, MD  KLOR-CON 10 10 MEQ tablet TAKE 1 TABLET BY MOUTH EVERY DAY 09/18/11  Yes Herby Abraham, MD  levalbuterol [facility] HFA) 45 MCG/ACT inhaler Inhale 1-2 puffs into the  lungs every 4 (four) hours as needed. For shortness of breath 08/28/11  Yes Coralyn Helling, MD  levothyroxine (SYNTHROID, LEVOTHROID) 125 MCG tablet Take 125 mcg by mouth daily. Take a whole tablet Monday, Wednesday, Fridays. Take a a 1/2 tablet on Sundays, Tuesdays, Thursdays, and Saturdays. 09/23/11  Yes Roberto Massed, MD  lithium carbonate (LITHOBID) 300 MG CR tablet Take 300 mg by mouth 2 (two) times daily. 08/28/11 10/08/12 Yes Roberto Massed, MD  nitroGLYCERIN (NITROSTAT) 0.3 MG SL tablet Place 0.3 mg under the tongue every 5 (five) minutes as needed. For chest pain   Yes Historical Provider, MD  olmesartan (BENICAR) 5 MG tablet Take 1 tablet (5 mg total) by mouth daily. 10/28/11 10/27/12 Yes Roberto Massed, MD  sitaGLIPtin (JANUVIA) 100 MG tablet Take 1 tablet (100 mg total) by mouth daily. 08/26/11  Yes Roberto Massed, MD  Tamsulosin HCl (FLOMAX) 0.4 MG CAPS Take 0.4 mg by mouth daily.     Yes Historical Provider, MD  tiotropium (SPIRIVA) 18 MCG inhalation capsule Place 1 capsule (18 mcg total) into inhaler and inhale daily. 09/04/11 09/03/12 Yes Coralyn Helling, MD  warfarin (COUMADIN) 5 MG tablet Take 2.5-5 mg by mouth daily. Take 1/2 TAB DAILY except on Friday then take 1 tab   Yes Historical Provider, MD  NON FORMULARY 2 Act daily. Flutter Valve.     Historical Provider, MD  NON FORMULARY as directed. Home nebulizer.     Historical Provider, MD    Allergies  Allergen Reactions  . Atorvastatin     REACTION: muscle ache in legs  . Crestor (Rosuvastatin Calcium) Other (See Comments)    Lower ext fatigue and soreness- if  taken everyday  . Lisinopril Diarrhea  . Morphine     REACTION: AMS - agitation  . Prednisone     REACTION: Can't breath  . Zolpidem Tartrate     REACTION: AMS    Physical Exam  Vitals  Blood pressure 161/93, pulse 66, temperature 97.8 F (36.6 C), temperature source Oral, resp. rate 18, SpO2 98.00%.   1. General elderly male lying in bed in NAD,   2. Normal  affect and insight, Not Suicidal or Homicidal, Awake Alert, Oriented X 3.  3. No F.N deficits, ALL C.Nerves Intact, Strength 5/5 all 4 extremities, Sensation intact all 4 extremities, Plantars down going.  4. Ears and Eyes appear Normal, Conjunctivae clear, PERRLA. Moist Oral Mucosa.  5. Supple Neck, No JVD, No cervical lymphadenopathy appriciated, No Carotid Bruits.  6. Symmetrical Chest wall movement, decreased air entry bilaterally, with diffuse  7. irregular rhythm, No Gallops, Rubs or Murmurs, No Parasternal Heave.  8. Positive Bowel Sounds, Abdomen Soft, Non tender, No organomegaly appriciated,No rebound -guarding or rigidity.  9.  No Cyanosis, Normal Skin Turgor, No Skin Rash or Bruise.  10. Good muscle tone,  joints appear normal , no effusions, Normal ROM.  11. No Palpable Lymph Nodes in Neck or Axillae  Data Review  CBC  Lab 01/19/12 0233 01/15/12 1501  WBC 9.0 7.1  HGB 12.1* 11.9*  HCT 38.1* 36.8*  PLT 163 145.0*  MCV 93.6 91.2  MCH 29.7 --  MCHC 31.8 32.4  RDW 15.2 15.0*  LYMPHSABS -- 1.0  MONOABS -- 0.4  EOSABS -- 0.1  BASOSABS -- 0.0  BANDABS -- --   ------------------------------------------------------------------------------------------------------------------  Chemistries   Lab 01/19/12 0233 01/15/12 1501  NA 136 136  K 4.1 4.3  CL 102 103  CO2 28 29  GLUCOSE 94 92  BUN 33* 29*  CREATININE 1.68* 1.4  CALCIUM 9.8 9.5  MG -- --  AST -- 15  ALT -- 15  ALKPHOS -- 61  BILITOT -- 0.6   ------------------------------------------------------------------------------------------------------------------ CrCl is unknown because both a height and weight (above a minimum accepted value) are required for this calculation. ------------------------------------------------------------------------------------------------------------------ No results found for this basename: TSH,T4TOTAL,FREET3,T3FREE,THYROIDAB in the last 72 hours   Coagulation  profile  Lab 01/15/12 1505  INR 2.97*  PROTIME --   ------------------------------------------------------------------------------------------------------------------- No results found for this basename: DDIMER:2 in the last 72 hours -------------------------------------------------------------------------------------------------------------------  Cardiac Enzymes No results found for this basename: CK:3,CKMB:3,TROPONINI:3,MYOGLOBIN:3 in the last 168 hours ------------------------------------------------------------------------------------------------------------------ No components found with this basename: POCBNP:3   ---------------------------------------------------------------------------------------------------------------  Urinalysis    Component Value Date/Time   COLORURINE YELLOW 03/23/2011 1127   APPEARANCEUR CLEAR 03/23/2011 1127   LABSPEC 1.006 03/23/2011 1127   PHURINE 7.5 03/23/2011 1127   GLUCOSEU NEGATIVE 03/23/2011 1127   HGBUR LARGE* 03/23/2011 1127   BILIRUBINUR neg 10/09/2011 1538   BILIRUBINUR NEGATIVE 03/23/2011 1127   KETONESUR NEGATIVE 03/23/2011 1127   PROTEINUR NEGATIVE 03/23/2011 1127   UROBILINOGEN 0.2 10/09/2011 1538   UROBILINOGEN 0.2 03/23/2011 1127   NITRITE neg 10/09/2011 1538   NITRITE NEGATIVE 03/23/2011 1127   LEUKOCYTESUR Negative 10/09/2011 1538    ----------------------------------------------------------------------------------------------------------------    Imaging results:   Dg Chest 2 View  01/19/2012  *RADIOLOGY REPORT*  Clinical Data: Sternal chest pain.  Shortness of breath.  CHEST - 2 VIEW  Comparison: 06/06/2011  Findings: Stable postoperative changes in the mediastinum.  Stable appearance of cardiac pacemaker.  Calcification of the aorta with tortuosity and ectatic descending aorta.  Aortic shadow appears stable  since the previous study.  Normal heart size and pulmonary vascularity.  Emphysematous changes in the lungs.   Fibrosis or linear atelectasis in the left lung base.  No blunting of costophrenic angles.  No pneumothorax.  Degenerative changes in the spine.  IMPRESSION: Tortuous and dilated thoracic aorta appears stable since previous study.  Emphysematous changes and fibrosis in the lungs.  No active consolidation.   Original Report Authenticated By: Marlon Pel, M.D.     My personal review of EKG: Showing normal sinus rhythm, with T-wave inversion in V5 V6 which was present on previous EKG in July of this year  Last echo Report 10/2011  - Left ventricle: The cavity size was mildly dilated. Wall thickness was normal. Systolic function was moderately to severely reduced. The estimated ejection fraction was in the range of 30% to 35%. There is akinesis of the entireapical myocardium. The distal 1/3 of the myocardium is akinetic and aneurysmal. No obvious apical thrombus with Definity. - Left atrium: The atrium was moderately dilated. - Pulmonary arteries: PA peak pressure: 42mm Hg (S).      Assessment & Plan  Active Problems:  Chest pain  COPD exacerbation  DIABETES MELLITUS, TYPE II  HYPERLIPIDEMIA  HYPERTENSION  CAD, ARTERY BYPASS GRAFT  ISCHEMIC CARDIOMYOPATHY  Atrial fibrillation  HYPOTHYROIDISM    1. chest pain, patient is known to have history of coronary artery disease, with last cardiac cath showing two out of four patent grafts, received 324 mg of aspirin, we'll cycle troponins, first set of cardiac enzyme is negative, but repeat point-of-care troponin is marginally elevated, consulted cardiology for further recommendation DR Allred recommending following CK and CK-MB,, the patient is already on anticoagulation, and on optimal medical treatment of his CAD, currently patient is chest pain-free  2. COPD exacerbation, patient will be continued on his home medication, and will start him on nebulizer treatment, and IV Symmetrel 60 mg every 6 hours, patient was started on by mouth  prednisone before 5 days by his PCP.  3. Acute on chronic renal failure, patient had elevated creatinine from his baseline, from 1.4-1.68, will hold Lasix and ACE inhibitor,   4. Atrial fibrillation, currently is rate controlled, patient is normal sinus rhythm, and is on anticoagulation, will have pharmacy to dose warfarin.  5. Coronary artery disease, and ischemic cardiomyopathy: Last echo showing ejection fraction of 30%, currently patient appears to be euvolemic, will hold Lasix as he is in acute renal failure, will continue him on aspirin, beta blockers, patient could not tolerate statins previously due to myalgias.  6. Hypothyroidism, continue with Synthroid,  7. Diabetes mellitus, start insulin sliding scale as patient is on steroids, and continue with Januvia  8. Hyperlipidemia, patient can't tolerate statin do to myalagia  9. Bipolar disorder, continue with lithium  DVT Prophylaxis on warfarin AM Labs Ordered, also please review Full Orders  Family Communication: Admission, patients condition and plan of care including tests being ordered have been discussed with the patient who indicate understanding and agree with the plan and Code Status.  Code Status full  Disposition Plan: home  Time spent in minutes :  Condition GUARDED  Randol Kern, Corinthian Mizrahi M.D on 01/19/2012 at 6:52 AM   [facility] Office  [phone]

## 2012-01-19 NOTE — ED Provider Notes (Signed)
History     CSN: [id number]  Arrival date & time 01/19/12  0219   First MD Initiated Contact with Patient 01/19/12 [phone]      Chief Complaint  Patient presents with  . Chest Pain    (Consider location/radiation/quality/duration/timing/severity/associated sxs/prior treatment) HPI Comments: Roberto [name] is a 76 y.o. Male who developed chest pain shortly after lying down tonight. Later, he took 2 nitroglycerin, and his wife  called EMS, then gave him ASA 324mg , EMS gave him additional nitroglycerin; that his pain completely resolved. Patient recently has been having generalized weakness and dyspnea on exertion, leg pain, and malaise. He saw his PCP 5 days ago, and was started on prednisone for wheezing. His shortness of breath is ongoing, he has COPD. He denies fever, chills, back pain, vomiting, or diarrhea. There are no known aggravating or other palliative factors.  Patient is a 76 y.o. male presenting with chest pain. The history is provided by the patient.  Chest Pain     Past Medical History  Diagnosis Date  . CAD (coronary artery disease)     post CABG with prior percutaneous intervention of teh saphenous vein graft with known saphenous vein graft disease.  Marland Kitchen AAA (abdominal aortic aneurysm) 2010    5.8cm  . Aneurysm of thoracic aorta 1995    Rupture 1995 w/spontaneous resolution  . Popliteal artery aneurysm   . Myocardial infarction   . Ischemic cardiomyopathy   . Paroxysmal atrial fibrillation   . Hypertension   . Peripheral vascular disease   . Dyslipidemia   . Diabetes mellitus 2007    dx'd 2007 w/persisting FBS>125  . GERD (gastroesophageal reflux disease)   . Hiatal hernia   . Diverticular disease   . BPH (benign prostatic hypertrophy)     with hx of acute urinary retention (pt can self-cath)  . COPD (chronic obstructive pulmonary disease)     05/09/08 PFT FEV1 1.84 (55%), FVC 4.42 (95%), TLC 6.46 (92%), DLCO 72%, no BD response  . Pneumonia 1/09  . Allergic  rhinitis   . Shingles     X 2 episodes, both in left scapula area  . OSA (obstructive sleep apnea) 06/14/09    PSG RDI 17, PLMI 96, intolerant of CPAP or BPAP  . Chronic renal insufficiency     Baseline Cr 1.5  . Prostate cancer 07/2008    Low grade; watchful waiting with [name]  . Pulmonary nodule, left 03/02/2010    Left base; resolved on f/u CT  . Allergic rhinitis   . Carpal tunnel syndrome of left wrist 02/11/2011  . IMPLANTATION OF DEFIBRILLATOR, HX OF 09/02/2008  . IRON DEFICIENCY 06/08/2010    Hemoccult negative:  GI ([name]) recommended no invasive diagnostics--watchful waiting (2012).  . UNSPECIFIED ANEMIA 06/01/2010    Multifactorial: iron def, CRI, vit B12 def,     Past Surgical History  Procedure Date  . Cardiac defibrillator placement 08/28/2004    [name]  . Colon surgery 1995    partial colectomy  . Coronary artery bypass graft 1996  . Aorta - bilateral femoral artery bypass graft 1999    [name] Early  . Nose surgery   . Insertion of pacing lead     New rate sensing pacing lead with removal of a previous implanted ICD and insertion of a device back in the pocket with defibrillation threshold testing  . Abdominal aortic aneurysm repair 1999    Ingrarenal repair/ graft  . Stents  Placed to the saphenous vein graft  . Radiofrequency ablation 2006    A flutter    Family History  Problem Relation Age of Onset  . Stroke Mother 56  . Heart attack Mother     CVA  . Cancer Father 22    Lung  . Heart attack Father 70  . Lung cancer Father   . Coronary artery disease Other     2 of 5 siblings with CAD    History  Substance Use Topics  . Smoking status: Former Smoker -- 1.0 packs/day for 30 years    Types: Cigarettes    Quit date: 05/23/2003  . Smokeless tobacco: Former Neurosurgeon    Types: Chew    Quit date: 05/14/2003   Comment: 80 pack a year history  . Alcohol Use: 0.6 oz/week    1 Cans of beer per week     only occ       Review of Systems  Cardiovascular: Positive for chest pain.  All other systems reviewed and are negative.    Allergies  Atorvastatin; Crestor; Lisinopril; Morphine; Prednisone; and Zolpidem tartrate  Home Medications   Current Outpatient Rx  Name Route Sig Dispense Refill  . ACETAMINOPHEN 325 MG PO TABS Oral Take 325 mg by mouth every 6 (six) hours as needed. For pain    . ALBUTEROL SULFATE (2.5 MG/3ML) 0.083% IN NEBU Nebulization Take 2.5 mg by nebulization every 6 (six) hours as needed. For shortness of breath    . AMIODARONE HCL 200 MG PO TABS Oral Take 200 mg by mouth daily.     . ASPIRIN 81 MG PO TABS Oral Take 81 mg by mouth daily.      . BUDESONIDE 0.25 MG/2ML IN SUSP Nebulization Take 2 mLs (0.25 mg total) by nebulization 2 (two) times daily. 60 mL 5  . BUDESONIDE 32 MCG/ACT NA SUSP Nasal Place 2 sprays into the nose daily. 1 Bottle 12  . CARVEDILOL 3.125 MG PO TABS Oral Take 1 tablet (3.125 mg total) by mouth 2 (two) times daily. 60 tablet 11  . CETIRIZINE HCL 10 MG PO TABS Oral Take 10 mg by mouth daily.      Marland Kitchen CLOTRIMAZOLE-BETAMETHASONE 1-0.05 % EX CREA Topical Apply 1 application topically 2 (two) times daily as needed. For skin irritation    . CYANOCOBALAMIN 1000 MCG/ML IJ SOLN Intramuscular Inject into the muscle every 30 (thirty) days.      . DESOXIMETASONE 0.25 % EX CREA Topical Apply 1 application topically 2 (two) times daily as needed. For rash    . ESOMEPRAZOLE MAGNESIUM 40 MG PO CPDR Oral Take 1 capsule (40 mg total) by mouth daily. 30 capsule 11  . FINASTERIDE 5 MG PO TABS Oral Take 5 mg by mouth daily.      . FUROSEMIDE 40 MG PO TABS Oral Take 1 tablet (40 mg total) by mouth daily. 30 tablet 3  . GUAIFENESIN ER 600 MG PO TB12 Oral Take 1,200 mg by mouth 2 (two) times daily as needed. For congestion    . GUAIFENESIN-DM 100-10 MG/5ML PO SYRP Oral Take 5 mLs by mouth 3 (three) times daily as needed. As needed for cough    . IPRATROPIUM BROMIDE 0.03 % NA SOLN   Two sprays three times a day as needed before meals    . ISOSORBIDE MONONITRATE ER 60 MG PO TB24 Oral Take 1 tablet (60 mg total) by mouth every morning. 30 tablet 11  . KLOR-CON 10 10 MEQ PO  TBCR  TAKE 1 TABLET BY MOUTH EVERY DAY 30 tablet 6  . LEVALBUTEROL TARTRATE 45 MCG/ACT IN AERO Inhalation Inhale 1-2 puffs into the lungs every 4 (four) hours as needed. For shortness of breath 1 Inhaler 6  . LEVOTHYROXINE SODIUM 125 MCG PO TABS Oral Take 125 mcg by mouth daily. Take a whole tablet Monday, Wednesday, Fridays. Take a a 1/2 tablet on Sundays, Tuesdays, Thursdays, and Saturdays.    Marland Kitchen LITHIUM CARBONATE ER 300 MG PO TBCR Oral Take 300 mg by mouth 2 (two) times daily.    Marland Kitchen NITROGLYCERIN 0.3 MG SL SUBL Sublingual Place 0.3 mg under the tongue every 5 (five) minutes as needed. For chest pain    . OLMESARTAN MEDOXOMIL 5 MG PO TABS Oral Take 1 tablet (5 mg total) by mouth daily. 30 tablet 5  . SITAGLIPTIN PHOSPHATE 100 MG PO TABS Oral Take 1 tablet (100 mg total) by mouth daily. 30 tablet 3  . TAMSULOSIN HCL 0.4 MG PO CAPS Oral Take 0.4 mg by mouth daily.      Marland Kitchen TIOTROPIUM BROMIDE MONOHYDRATE 18 MCG IN CAPS Inhalation Place 1 capsule (18 mcg total) into inhaler and inhale daily. 30 capsule 5  . WARFARIN SODIUM 5 MG PO TABS Oral Take 2.5-5 mg by mouth daily. Take 1/2 TAB DAILY except on Friday then take 1 tab    . NON FORMULARY  2 Act daily. Flutter Valve.     . NON FORMULARY  as directed. Home nebulizer.       BP 145/84  Pulse 67  Temp 97.8 F (36.6 C) (Oral)  Resp 22  SpO2 98%  Physical Exam  Nursing note and vitals reviewed. Constitutional: He is oriented to person, place, and time. He appears well-developed and well-nourished.  HENT:  Head: Normocephalic and atraumatic.  Right Ear: External ear normal.  Left Ear: External ear normal.  Eyes: Conjunctivae and EOM are normal. Pupils are equal, round, and reactive to light.  Neck: Normal range of motion and phonation normal. Neck supple.   Cardiovascular: Normal rate, regular rhythm, normal heart sounds and intact distal pulses.   Pulmonary/Chest: Effort normal. He has no wheezes. He exhibits no bony tenderness.       Decreased breath sounds, bilaterally.  Abdominal: Soft. Normal appearance. There is tenderness (Very mild epigastric tenderness).  Musculoskeletal: Normal range of motion.  Neurological: He is alert and oriented to person, place, and time. He has normal strength. No cranial nerve deficit or sensory deficit. He exhibits normal muscle tone. Coordination normal.  Skin: Skin is warm, dry and intact.  Psychiatric: He has a normal mood and affect. His behavior is normal. Judgment and thought content normal.    ED Course  Procedures (including critical care time)  Initial evaluation is consistent with gastric distress from prednisone. He is already seeing, nitroglycerin, and aspirin. Will await second cardiac marker then contacted his cardiologist.   Date: 01/19/2012  Rate: 73  Rhythm: normal sinus rhythm  QRS Axis: normal  Intervals: normal  ST/T Wave abnormalities: normal  Conduction Disutrbances:none  Narrative Interpretation: left axis  Old EKG Reviewed: unchanged  Reevaluation: 05:35- no recurrence of the discomfort  Discussion with cardiologist: Dr. Mayford Knife recommends, that the patient stay for additional monitoring. She advises that the cardiology service can be consulted in the morning if needed.   Labs Reviewed  CBC - Abnormal; Notable for the following:    RBC 4.07 (*)     Hemoglobin 12.1 (*)     HCT  38.1 (*)     All other components within normal limits  BASIC METABOLIC PANEL - Abnormal; Notable for the following:    BUN 33 (*)     Creatinine, Ser 1.68 (*)     GFR calc non Af Amer 39 (*)     GFR calc Af Amer 45 (*)     All other components within normal limits  POCT I-STAT TROPONIN I - Abnormal; Notable for the following:    Troponin i, poc 0.09 (*)     All other components within normal  limits  POCT I-STAT TROPONIN I   Dg Chest 2 View  01/19/2012  *RADIOLOGY REPORT*  Clinical Data: Sternal chest pain.  Shortness of breath.  CHEST - 2 VIEW  Comparison: 06/06/2011  Findings: Stable postoperative changes in the mediastinum.  Stable appearance of cardiac pacemaker.  Calcification of the aorta with tortuosity and ectatic descending aorta.  Aortic shadow appears stable since the previous study.  Normal heart size and pulmonary vascularity.  Emphysematous changes in the lungs.  Fibrosis or linear atelectasis in the left lung base.  No blunting of costophrenic angles.  No pneumothorax.  Degenerative changes in the spine.  IMPRESSION: Tortuous and dilated thoracic aorta appears stable since previous study.  Emphysematous changes and fibrosis in the lungs.  No active consolidation.   Original Report Authenticated By: Marlon Pel, M.D.      1. Chest pain       MDM  Nonspecific chest pain, with elements of gastritis, and recent cause for gastritis, with prednisone treatment. Doubt ACS, thoracic aortic aneurysm dissection or rupture, PE, or pneumonia. Patient has improved, and is stable in the emergency department with 2 negative troponin tests. Case was discussed with the on-call cardiologist.    Plan: Admit- [facility]      Flint Melter, MD 01/19/12 [phone]

## 2012-01-19 NOTE — Progress Notes (Addendum)
ANTICOAGULATION CONSULT NOTE - Initial Consult  Pharmacy Consult for Coumadin Indication: atrial fibrillation  Allergies  Allergen Reactions  . Atorvastatin     REACTION: muscle ache in legs  . Crestor (Rosuvastatin Calcium) Other (See Comments)    Lower ext fatigue and soreness- if taken everyday  . Lisinopril Diarrhea  . Morphine     REACTION: AMS - agitation  . Prednisone     REACTION: Can't breath  . Zolpidem Tartrate     REACTION: AMS    Patient Measurements:     Vital Signs: Temp: 97.8 F (36.6 C) (09/08 0233) Temp src: Oral (09/08 0233) BP: 161/93 mmHg (09/08 0609) Pulse Rate: 66  (09/08 0600)  Labs:  Basename 01/19/12 0233  HGB 12.1*  HCT 38.1*  PLT 163  APTT --  LABPROT --  INR --  HEPARINUNFRC --  CREATININE 1.68*  CKTOTAL --  CKMB --  TROPONINI --    The CrCl is unknown because both a height and weight (above a minimum accepted value) are required for this calculation.   Medical History: Past Medical History  Diagnosis Date  . CAD (coronary artery disease)     post CABG with prior percutaneous intervention of teh saphenous vein graft with known saphenous vein graft disease.  Marland Kitchen AAA (abdominal aortic aneurysm) 2010    5.8cm  . Aneurysm of thoracic aorta 1995    Rupture 1995 w/spontaneous resolution  . Popliteal artery aneurysm   . Myocardial infarction   . Ischemic cardiomyopathy   . Paroxysmal atrial fibrillation   . Hypertension   . Peripheral vascular disease   . Dyslipidemia   . Diabetes mellitus 2007    dx'd 2007 w/persisting FBS>125  . GERD (gastroesophageal reflux disease)   . Hiatal hernia   . Diverticular disease   . BPH (benign prostatic hypertrophy)     with hx of acute urinary retention (pt can self-cath)  . COPD (chronic obstructive pulmonary disease)     05/09/08 PFT FEV1 1.84 (55%), FVC 4.42 (95%), TLC 6.46 (92%), DLCO 72%, no BD response  . Pneumonia 1/09  . Allergic rhinitis   . Shingles     X 2 episodes, both in  left scapula area  . OSA (obstructive sleep apnea) 06/14/09    PSG RDI 17, PLMI 96, intolerant of CPAP or BPAP  . Chronic renal insufficiency     Baseline Cr 1.5  . Prostate cancer 07/2008    Low grade; watchful waiting with [name]  . Pulmonary nodule, left 03/02/2010    Left base; resolved on f/u CT  . Allergic rhinitis   . Carpal tunnel syndrome of left wrist 02/11/2011  . IMPLANTATION OF DEFIBRILLATOR, HX OF 09/02/2008  . IRON DEFICIENCY 06/08/2010    Hemoccult negative:  GI ([name]) recommended no invasive diagnostics--watchful waiting (2012).  . UNSPECIFIED ANEMIA 06/01/2010    Multifactorial: iron def, CRI, vit B12 def,     Medications:  APAP  Albuterol /Atrovent/Xopenex   Amiodarone  ASA  Pulmicort  COreg  Zyrtec  Nexium  Imdur  Synthroid  Lithium  Benicar  Januvia  Flomax  Coumadin 2.5 mg daily except 5 mg Fridays  Assessment: 76 yo male admitted with chest pain, h/o Afib, to continue anticoagulation  Goal of Therapy:  INR 2-3 Monitor platelets by anticoagulation protocol: Yes   Plan:  F/U daily INR  Abbott, Gary Fleet 01/19/2012,6:54 AM  Addendum: Coumadin currently on hold per Theodore Demark, PA note 9/8 until MD can assess need for  anticoagulation vs cath plans. Will continue to follow.  Tomi Bamberger, PharmD Clinical Pharmacist Pager: [phone] Pharmacy: [phone] 01/19/2012 11:11 AM

## 2012-01-19 NOTE — ED Notes (Signed)
RRaford Pitcher, PA at bedside. Informed & aware of elevated POC troponin

## 2012-01-19 NOTE — ED Notes (Signed)
Admitting MD at bedside.

## 2012-01-19 NOTE — Progress Notes (Signed)
Hospitalist followup note:  Patient was admitted earlier this morning. He's been having some episodes of chest pain which are relieved with nitroglycerin. Evaluated by cardiology feels may not be necessarily related to anything acute cardiac. I went to go see the patient, he tells me he himself states that his breathing is not good. In addition he also notes that at times he does get choked up especially when eating or drinking something.  Cardiovascular: Regular rate and rhythm, S1-S2 Lungs: Bilateral mild end expiratory wheezing.  On concern about possibility of trace aspiration which may be leading to bronchospasm. We'll continue his COPD medications and have asked speech therapy to see for swallow evaluation. We'll followup.

## 2012-01-19 NOTE — Progress Notes (Signed)
Pt c/o 7/10 chest pain, non-radiating.  BP elevated.  1 SL NTG given.  Pt with complete relief of symptoms after 5 minutes.  Will cont to monitor.  Jashan Cotten, [facility]

## 2012-01-19 NOTE — ED Notes (Signed)
Report received, assumed care.  

## 2012-01-19 NOTE — Consult Note (Signed)
CARDIOLOGY CONSULT NOTE   Patient ID: Roberto [name] MRN: [id number] DOB/AGE: 1936-01-07 76 y.o.  Admit date: 01/19/2012  Primary Physician   Jeoffrey Massed, MD Primary Cardiologist   TS (saw 12/09/2011) Reason for Consultation   Abnl Trop, chest pain  Roberto [name] is a 76 y.o. male with a history of CAD.  He went to a social event yesterday and admits to significant dietary indiscretion. He got home about 7:00 and went to bed after 11. About 11:30, he developed substernal chest pain that eventually reached a 9/10. He describes the pain as a pressure. He had some shortness of breath with it but no nausea, vomiting or diaphoresis. Because of all the food he had eaten, he was complaining of some abdominal discomfort. He did not feel he was wheezing acutely. Initially, he treated it with Tom's and then drank echo so he would burp. He says the burping would help the pain temporarily but it would come right back. When the GI medicines did not work, he got his wife to give him sublingual nitroglycerin. He took sublingual nitroglycerin x2 at home. EMS was called and gave him 2 more nitroglycerin. The nitroglycerin significantly helped his pain and by arrival at the hospital the pain was almost gone. He is currently pain-3. Total duration was approximately an hour and half. He cannot remember the last time he had pain like this before. He has not been very active recently because his legs get weak. He is able to get around in the house and the yard. He uses a cane for stability and this helps. He gets short of breath with exertion but this is chronic and has not changed recently. He denies lower extremity edema or PND. He has an element of chronic orthopnea which has not changed. Currently he is resting comfortably.   Past Medical History  Diagnosis Date  . CAD (coronary artery disease)     post CABG with prior percutaneous intervention of the saphenous vein graft with known saphenous vein graft  disease.  Marland Kitchen AAA (abdominal aortic aneurysm) 2010    5.8cm  . Aneurysm of thoracic aorta 1995    Rupture 1995 w/spontaneous resolution  . Popliteal artery aneurysm   . Myocardial infarction   . Ischemic cardiomyopathy   . Paroxysmal atrial fibrillation   . Hypertension   . Peripheral vascular disease   . Dyslipidemia   . Diabetes mellitus 2007    dx'd 2007 w/persisting FBS>125  . GERD (gastroesophageal reflux disease)   . Hiatal hernia   . Diverticular disease   . BPH (benign prostatic hypertrophy)     with hx of acute urinary retention (pt can self-cath)  . COPD (chronic obstructive pulmonary disease)     05/09/08 PFT FEV1 1.84 (55%), FVC 4.42 (95%), TLC 6.46 (92%), DLCO 72%, no BD response  . Pneumonia 1/09  . Allergic rhinitis   . Shingles     X 2 episodes, both in left scapula area  . OSA (obstructive sleep apnea) 06/14/09    PSG RDI 17, PLMI 96, intolerant of CPAP or BPAP  . Chronic renal insufficiency     Baseline Cr 1.5  . Prostate cancer 07/2008    Low grade; watchful waiting with [name]  . Pulmonary nodule, left 03/02/2010    Left base; resolved on f/u CT  . Allergic rhinitis   . Carpal tunnel syndrome of left wrist 02/11/2011  . IMPLANTATION OF DEFIBRILLATOR, HX OF 09/02/2008    Medtronic 7232Cx Maximo  V. Had a [id number] lead - it failed and therapies turned off 5/13.  Marland Kitchen IRON DEFICIENCY 06/08/2010    Hemoccult negative:  GI ([name]) recommended no invasive diagnostics--watchful waiting (2012).  . UNSPECIFIED ANEMIA 06/01/2010    Multifactorial: iron def, CRI, vit B12 def,     Past Surgical History  Procedure Date  . Cardiac defibrillator placement 08/28/2004    [name]  . Colon surgery 1995    partial colectomy  . Coronary artery bypass graft 1996  . Aorta - bilateral femoral artery bypass graft 1999    [name] Early  . Nose surgery   . Insertion of pacing lead     New rate sensing pacing lead with removal of a previous implanted ICD and  insertion of a device back in the pocket with defibrillation threshold testing  . Abdominal aortic aneurysm repair 1999    Ingrarenal repair/ graft  . Stents     Placed to the saphenous vein graft  . Radiofrequency ablation 2006    A flutter    Allergies  Allergen Reactions  . Atorvastatin     REACTION: muscle ache in legs  . Crestor (Rosuvastatin Calcium) Other (See Comments)    Lower ext fatigue and soreness- if taken everyday  . Lisinopril Diarrhea  . Morphine     REACTION: AMS - agitation  . Prednisone     REACTION: Can't breath  . Zolpidem Tartrate     REACTION: AMS    I have reviewed the patient's current medications    . albuterol  2.5 mg Nebulization Q6H  . amiodarone  200 mg Oral Daily  . aspirin  81 mg Oral Daily  . budesonide  0.25 mg Nebulization BID  . carvedilol  3.125 mg Oral BID  . clotrimazole   Topical BID  . finasteride  5 mg Oral Daily  . fluticasone  1 spray Each Nare Daily  . guaiFENesin  1,200 mg Oral BID  . insulin aspart  0-9 Units Subcutaneous TID WC  . ipratropium  0.5 mg Nebulization Q6H  . isosorbide mononitrate  60 mg Oral BH-q7a  . levothyroxine  125 mcg Oral Daily  . linagliptin  5 mg Oral Daily  . lithium carbonate  300 mg Oral BID  . loratadine  10 mg Oral Daily  . methylPREDNISolone (SOLU-MEDROL) injection  60 mg Intravenous Q6H  . pantoprazole  40 mg Oral Daily  . sodium chloride  3 mL Intravenous Q12H  . Tamsulosin HCl  0.4 mg Oral Daily  . tiotropium  18 mcg Inhalation Daily  . Warfarin - Pharmacist Dosing Inpatient   Does not apply q1800     acetaminophen, albuterol, alum & mag hydroxide-simeth, nitroGLYCERIN, ondansetron (ZOFRAN) IV, ondansetron  Medication Sig  acetaminophen  325 MG tablet Take 325 mg by mouth every 6 (six) hours as needed. For pain  Albuterol (2.5 MG/3ML) 0.083% nebulizer solution Take 2.5 mg by nebulization every 6 (six) hours as needed. For shortness of breath  amiodarone   200 MG tablet Take 200 mg  by mouth daily.   aspirin 81 MG tablet Take 81 mg by mouth daily.    budesonide (PULMICORT) 0.25 MG/2ML nebulizer solution Take 2 mLs (0.25 mg total) by nebulization 2 (two) times daily.  budesonide 32 MCG/ACT nasal spray Place 2 sprays into the nose daily.  carvedilol (COREG) 3.125 MG tablet Take 1 tablet (3.125 mg total) by mouth 2 (two) times daily.  cetirizine (ZYRTEC) 10 MG tablet Take 10 mg  by mouth daily.    clotrimazole-betamethasone (LOTRISONE) cream Apply 1 application topically 2 (two) times daily as needed. For skin irritation  cyanocobalamin 1000 MCG/ML injection Inject into the muscle every 30 (thirty) days.    Desoximetasone 0.25 % cream Apply 1 application topically 2  times daily as needed. For rash  esomeprazole (NEXIUM) 40 MG capsule Take 1 capsule (40 mg total) by mouth daily.  finasteride (PROSCAR) 5 MG tablet Take 5 mg by mouth daily.    furosemide (LASIX) 40 MG tablet Take 1 tablet (40 mg total) by mouth daily.  guaiFENesin 600 MG 12 hr tablet Take 1,200 mg by mouth 2 times daily as needed. congestion  guaiFENesin-dextromethorphan (ROBITUSSIN DM) 100-10 MG/5ML syrup Take 5 mLs by mouth 3 (three) times daily as needed. As needed for cough  ipratropium (ATROVENT) 0.03 % nasal spray Two sprays three times a day as needed before meals  isosorbide mononitrate (IMDUR) 60 MG 24 hr tablet Take 1 tablet (60 mg total) by mouth every morning.  KLOR-CON 10 10 MEQ tablet TAKE 1 TABLET BY MOUTH EVERY DAY  levalbuterol (XOPENEX HFA) 45 MCG/ACT inhaler Inhale 1-2 puffs into the lungs every 4 (four) hours as needed. For shortness of breath  levothyroxine (SYNTHROID, LEVOTHROID) 125 MCG tablet Take 125 mcg by mouth daily. Take a whole tablet Monday, Wednesday, Fridays. Take a a 1/2 tablet on Sundays, Tuesdays, Thursdays, and Saturdays.  lithium carbonate (LITHOBID) 300 MG CR tablet Take 300 mg by mouth 2 (two) times daily.  nitroGLYCERIN (NITROSTAT) 0.3 MG SL tablet Place 0.3 mg under the  tongue every 5 (five) minutes as needed. For chest pain  olmesartan (BENICAR) 5 MG tablet Take 1 tablet (5 mg total) by mouth daily.  sitaGLIPtin (JANUVIA) 100 MG tablet Take 1 tablet (100 mg total) by mouth daily.  Tamsulosin HCl (FLOMAX) 0.4 MG CAPS Take 0.4 mg by mouth daily.    tiotropium (SPIRIVA) 18 MCG inhalation capsule Place 1 capsule (18 mcg total) into inhaler and inhale daily.  warfarin (COUMADIN) 5 MG tablet Take 2.5-5 mg by mouth daily. Take 1/2 TAB DAILY except on Friday then take 1 tab  NON FORMULARY 2 Act daily. Flutter Valve.   NON FORMULARY as directed. Home nebulizer.      History   Social History  . Marital Status: Married    Spouse Name: N/A    Number of Children: N/A  . Years of Education: N/A   Occupational History  . RETIRED     Mechanic   Social History Main Topics  . Smoking status: Former Smoker -- 1.0 packs/day for 30 years    Types: Cigarettes    Quit date: 05/23/2003  . Smokeless tobacco: Former Neurosurgeon    Types: Chew    Quit date: 05/14/2003   Comment: 80 pack a year history  . Alcohol Use: 0.6 oz/week    1 Cans of beer per week     only occ  . Drug Use: No  . Sexually Active: Not on file   Other Topics Concern  . Not on file   Social History Narrative   Lives in [place] with his wife.Very involved in his church.Enjoys bluegrass music, going to [employer], spending time with extended family.Used to play golf twice a week up until 2012.No alc/drug use.Former smoker, quit 2005.     Family History  Problem Relation Age of Onset  . Stroke Mother 30  . Heart attack Mother     CVA  . Cancer Father 53  Lung  . Heart attack Father 19  . Lung cancer Father   . Coronary artery disease Other     2 of 5 siblings with CAD     ROS: He has not had fevers or chills. He coughs and wheezes on a regular basis but the cough is generally nonproductive. He has clear nasal discharge but is not sneezing. He has leg weakness which is chronic and some  balance issues but has not fallen. He has musculoskeletal aches and pains. He is currently having some abdominal pain but denies constipation or diarrhea. He has occasional reflux symptoms. Full 14 point review of systems complete and found to be negative unless listed above.  Physical Exam: Blood pressure 161/93, pulse 66, temperature 97.8 F (36.6 C), temperature source Oral, resp. rate 18, SpO2 98.00%.  General: Well developed, well nourished, male in no acute distress Head: Eyes PERRLA, No xanthomas.   Normocephalic and atraumatic, oropharynx without edema or exudate. Dentition: poor Lungs: bilateral basilar rales, slight exp wheeze Heart: HRRR S1 S2, no rub/gallop, systolic 2/6 murmur. pulses are 2+ bilateral upper extrem, slightly decreased lower extrem.   Neck: No carotid bruits. No lymphadenopathy.  JVD at about 8 cm. Abdomen: Bowel sounds present, abdomen soft and non-tender without masses or hernias noted. Msk:  No spine or cva tenderness. No weakness, no joint deformities or effusions. Extremities: No clubbing or cyanosis. No edema.  Neuro: Alert and oriented X 3. No focal deficits noted. Psych:  Good affect, responds appropriately Skin: No rashes or lesions noted.  Labs:   Lab Results  Component Value Date   WBC 9.0 01/19/2012   HGB 12.1* 01/19/2012   HCT 38.1* 01/19/2012   MCV 93.6 01/19/2012   PLT 163 01/19/2012   Lab Results  Component Value Date   INR 2.81* 01/19/2012   INR 2.97* 01/15/2012   INR 2.4 12/18/2011     Lab 01/19/12 0233 01/15/12 1501  NA 136 --  K 4.1 --  CL 102 --  CO2 28 --  BUN 33* 29  CREATININE 1.68* --1.4  CALCIUM 9.8 --  PROT -- 6.3  BILITOT -- 0.6  ALKPHOS -- 61  ALT -- 15  AST -- 15  GLUCOSE 94 --    Basename 01/19/12 0442 01/19/12 0252  TROPIPOC 0.09* 0.00   Lab Results  Component Value Date   CKTOTAL 69 01/19/2012   CKMB 3.8 01/19/2012   TROPONINI 0.40* 01/19/2012    TSH  Date/Time Value Range Status  01/15/2012  3:01 PM 0.42  0.35 - 5.50  uIU/mL Final     Echo: 11/06/2011 Study Conclusions - Left ventricle: The cavity size was mildly dilated. Wall thickness was normal. Systolic function was moderately to severely reduced. The estimated ejection fraction was in the range of 30% to 35%. There is akinesis of the entireapical myocardium. The distal 1/3 of the myocardium is akinetic and aneurysmal. No obvious apical thrombus with Definity. - Left atrium: The atrium was moderately dilated. - Pulmonary arteries: PA peak pressure: 42mm Hg (S).  Cardiac Cath: 10/17/2009 Left main: Patent LAD: 40% Circumflex: 70%; OM 1: Total , OM 2:90%, PL branch: 95% RCA: RV branch: Occluded with some distal recanalization SVG to RCA: Total SVG to circumflex: Occluded, New SVG to diagonal 40% LIMA to LAD: Patent Medical therapy recommended  ECG:  19-Jan-2012 02:31:27  SINUS RHYTHM ~ normal P axis, V-rate 50- 99 LAD, CONSIDER LEFT ANTERIOR FASCICULAR BLOCK ~ axis(240,-40), S>R II III aVF CONSIDER ANTEROSEPTAL INFARCT ~ Q >  30mS, V1 V2 Vent. rate 73 BPM PR interval 176 ms QRS duration 112 ms QT/QTc 416/458 ms P-R-T axes 74 -52 115  Radiology:  Dg Chest 2 View 01/19/2012  *RADIOLOGY REPORT*  Clinical Data: Sternal chest pain.  Shortness of breath.  CHEST - 2 VIEW  Comparison: 06/06/2011  Findings: Stable postoperative changes in the mediastinum.  Stable appearance of cardiac pacemaker.  Calcification of the aorta with tortuosity and ectatic descending aorta.  Aortic shadow appears stable since the previous study.  Normal heart size and pulmonary vascularity.  Emphysematous changes in the lungs.  Fibrosis or linear atelectasis in the left lung base.  No blunting of costophrenic angles.  No pneumothorax.  Degenerative changes in the spine.  IMPRESSION: Tortuous and dilated thoracic aorta appears stable since previous study.  Emphysematous changes and fibrosis in the lungs.  No active consolidation.   Original Report Authenticated By: Marlon Pel, M.D.     ASSESSMENT AND PLAN:   The patient was seen today by Dr [name], the patient evaluated and the data reviewed.   Chest pain - Roberto [name] has a history of coronary artery disease and ischemic cardiomyopathy. He does not exert himself very much but his no recent history of exertional chest pain. His symptoms began after significant dietary indiscretion. His initial troponin is minimally elevated. First CK-MB is within normal limits and followup enzymes are pending. Will continue to cycle enzymes. His ECG does not have acute changes. His INR is therapeutic. We'll hold the Coumadin for now. M.D. advise on this starting heparin. Continue Imdur. Tomorrow, once all data reviewed, decide on catheterization. Will need to follow his kidney function closely. Hold Lasix for now.  Paroxysmal Atrial fibrillation - currently maintaining sinus rhythm, continue amiodarone.  Otherwise, per primary M.D. Active Problems:  DIABETES MELLITUS, TYPE II  HYPERLIPIDEMIA  HYPERTENSION  CAD, ARTERY BYPASS GRAFT  ISCHEMIC CARDIOMYOPATHY  HYPOTHYROIDISM  COPD exacerbation   Signed: Theodore Demark 01/19/2012, 7:36 AM Co-Sign MD   I have seen, examined the patient, and reviewed the above assessment and plan.  Changes to above are made where necessary.  Roberto [name] is well known to our practice.  He has had stable coronary disease with last cath in 2011.  He has a known large thoracic aneurysm followed by Dr Early with referral to [facility] at which time he declined intervention. At this time, he presents with chest pain intermittently controlled with NTG. His CkMB remains negative though troponin is mildly elevated.  EKG reveals stable twi inversions in I,VL, and V5/V6.  At this time, he is pain free. I will therefore plan medical therapy.  I will uptitrate imdur and coreg if able (previously he has been bradycardic).  Add norvasc for angina/ elevated BP.  We will hold coumadin for now until we know  definitively whether or not cath will be required.  If we are able to control his pain with medicine and his CMs do not become robustly elevated then medical therapy may be a reasonable strategy.  If however we cannot control his pain or he rules in with a large MI then cath may be required.  I will ask Dr Riley Kill who knows him well to weigh in on this in the am.  Co Sign: [name], MD 01/19/2012 11:50 AM

## 2012-01-19 NOTE — ED Notes (Signed)
Pt is from home. Dull aching pain in chest, non radiating. Took 2 nitro prior to EMS arrival. EMS gave an additional nitro, currently pain free at this time. 12 lead normal.

## 2012-01-20 ENCOUNTER — Inpatient Hospital Stay (HOSPITAL_COMMUNITY): Payer: Medicare Other

## 2012-01-20 DIAGNOSIS — I214 Non-ST elevation (NSTEMI) myocardial infarction: Secondary | ICD-10-CM

## 2012-01-20 DIAGNOSIS — I10 Essential (primary) hypertension: Secondary | ICD-10-CM

## 2012-01-20 DIAGNOSIS — J449 Chronic obstructive pulmonary disease, unspecified: Secondary | ICD-10-CM

## 2012-01-20 LAB — GLUCOSE, CAPILLARY
Glucose-Capillary: 166 mg/dL — ABNORMAL HIGH (ref 70–99)
Glucose-Capillary: 167 mg/dL — ABNORMAL HIGH (ref 70–99)

## 2012-01-20 LAB — BASIC METABOLIC PANEL
CO2: 25 mEq/L (ref 19–32)
Calcium: 10.3 mg/dL (ref 8.4–10.5)
Creatinine, Ser: 1.23 mg/dL (ref 0.50–1.35)
GFR calc Af Amer: 65 mL/min — ABNORMAL LOW (ref >90)
Sodium: 137 mEq/L (ref 135–145)

## 2012-01-20 LAB — CK TOTAL AND CKMB (NOT AT ARMC)
CK, MB: 4 ng/mL (ref 0.3–4.0)
Total CK: 46 U/L (ref 7–232)

## 2012-01-20 LAB — CBC
MCH: 29.1 pg (ref 26.0–34.0)
MCV: 92.1 fL (ref 78.0–100.0)
Platelets: 165 10*3/uL (ref 150–400)
RBC: 4.05 MIL/uL — ABNORMAL LOW (ref 4.22–5.81)
RDW: 14.9 % (ref 11.5–15.5)
WBC: 10.5 10*3/uL (ref 4.0–10.5)

## 2012-01-20 LAB — PROTIME-INR
INR: 2.57 — ABNORMAL HIGH (ref 0.00–1.49)
Prothrombin Time: 28 seconds — ABNORMAL HIGH (ref 11.6–15.2)

## 2012-01-20 MED ORDER — WARFARIN - PHYSICIAN DOSING INPATIENT: Freq: Every day | Status: DC

## 2012-01-20 MED ORDER — OMEPRAZOLE MAGNESIUM 20 MG PO TBEC: 20.0000 mg | DELAYED_RELEASE_TABLET | Freq: Two times a day (BID) | ORAL | Status: DC

## 2012-01-20 MED ORDER — WARFARIN SODIUM 2.5 MG PO TABS
2.5000 mg | ORAL_TABLET | Freq: Once | ORAL | Status: DC
  Filled 2012-01-20: qty 1

## 2012-01-20 MED ORDER — AMLODIPINE BESYLATE 5 MG PO TABS: 5.0000 mg | ORAL_TABLET | Freq: Every day | ORAL | Status: DC

## 2012-01-20 MED ORDER — ISOSORBIDE MONONITRATE ER 120 MG PO TB24: 120.0000 mg | ORAL_TABLET | ORAL | Status: DC

## 2012-01-20 MED ORDER — CARVEDILOL 6.25 MG PO TABS: 6.2500 mg | ORAL_TABLET | Freq: Two times a day (BID) | ORAL | Status: DC

## 2012-01-20 MED ORDER — HYDROCODONE-ACETAMINOPHEN 5-325 MG PO TABS
1.0000 | ORAL_TABLET | ORAL | Status: AC
  Administered 2012-01-20: 1 via ORAL
  Filled 2012-01-20: qty 1

## 2012-01-20 NOTE — Evaluation (Signed)
Clinical/Bedside Swallow Evaluation Patient Details  Name: Roberto [name] MRN: [id number] Date of Birth: 1936-02-04  Today's Date: 01/20/2012 Time: 8295-6213 SLP Time Calculation (min): 23 min  Past Medical History:  Past Medical History  Diagnosis Date  . CAD (coronary artery disease)     post CABG with prior percutaneous intervention of the saphenous vein graft with known saphenous vein graft disease.  Marland Kitchen AAA (abdominal aortic aneurysm) 2010    5.8cm  . Aneurysm of thoracic aorta 1995    Rupture 1995 w/spontaneous resolution  . Popliteal artery aneurysm   . Myocardial infarction   . Ischemic cardiomyopathy   . Paroxysmal atrial fibrillation   . Hypertension   . Peripheral vascular disease   . Dyslipidemia   . Diabetes mellitus 2007    dx'd 2007 w/persisting FBS>125  . GERD (gastroesophageal reflux disease)   . Hiatal hernia   . Diverticular disease   . BPH (benign prostatic hypertrophy)     with hx of acute urinary retention (pt can self-cath)  . COPD (chronic obstructive pulmonary disease)     05/09/08 PFT FEV1 1.84 (55%), FVC 4.42 (95%), TLC 6.46 (92%), DLCO 72%, no BD response  . Pneumonia 1/09  . Allergic rhinitis   . Shingles     X 2 episodes, both in left scapula area  . OSA (obstructive sleep apnea) 06/14/09    PSG RDI 17, PLMI 96, intolerant of CPAP or BPAP  . Chronic renal insufficiency     Baseline Cr 1.5  . Prostate cancer 07/2008    Low grade; watchful waiting with [name]  . Pulmonary nodule, left 03/02/2010    Left base; resolved on f/u CT  . Allergic rhinitis   . Carpal tunnel syndrome of left wrist 02/11/2011  . IMPLANTATION OF DEFIBRILLATOR, HX OF 09/02/2008    Medtronic 7232Cx Maximo V. Had a 6949 lead - failed and therapies turned off.  . IRON DEFICIENCY 06/08/2010    Hemoccult negative:  GI ([name]) recommended no invasive diagnostics--watchful waiting (2012).  . UNSPECIFIED ANEMIA 06/01/2010    Multifactorial: iron def, CRI, vit B12 def,     Past Surgical History:  Past Surgical History  Procedure Date  . Cardiac defibrillator placement 08/28/2004    [name]  . Colon surgery 1995    partial colectomy  . Coronary artery bypass graft 1996  . Aorta - bilateral femoral artery bypass graft 1999    [name] Early  . Nose surgery   . Insertion of pacing lead     New rate sensing pacing lead with removal of a previous implanted ICD and insertion of a device back in the pocket with defibrillation threshold testing  . Abdominal aortic aneurysm repair 1999    Ingrarenal repair/ graft  . Stents     Placed to the saphenous vein graft  . Radiofrequency ablation 2006    A flutter   HPI:  [name]  is a 76 y.o. male, with significant past medical history of coronary artery disease status post CABG with cardiac cath in June 2011 showing two out of four patent grafts and ischemic cardiovascular ejection fraction 20-25%, history of axial flutter and fibrillation on chronic warfarin diabetes mellitus hyperlipidemia and COPD, presents with complaints of chest pain, patient reports chest pain started at rest that before going to sleep, as well he reports that it was accompanied by worsening shortness of breath, denies any palpitation, lightheadedness, nausea, diaphoresis, patient reports he took 4 baby aspirin at home,  and the air he received care sublingual nitroglycerin by EMS, where he reports his chest pain has completely resolved, patient's first set of cardiac enzymes was negative, upon presentation to ED patient was found to have diffuse wheezing and decreased air entry, where he received multiple nebulizer treatments for COPD exacerbation   Assessment / Plan / Recommendation Clinical Impression  Bedside swallow evaluation complete. Patient presents without overt s/s of aspiration at bedside however with multiple risk factors for aspiration including h/o GERD, COPD, dysphonia. In light of MD suspicions for aspiration combined  with risk factors and patient report of frequent coughing episodes with po intake, will proceed with objective testing. Patient in agreement.     Aspiration Risk   (TBD)    Diet Recommendation Regular;Thin liquid   Liquid Administration via: Cup Medication Administration: Whole meds with liquid Supervision: Patient able to self feed;Intermittent supervision to cue for compensatory strategies Compensations: Slow rate;Small sips/bites Postural Changes and/or Swallow Maneuvers: Seated upright 90 degrees;Upright 30-60 min after meal    Other  Recommendations Recommended Consults: MBS Oral Care Recommendations: Oral care BID   Follow Up Recommendations  Other (comment) (TBD pending MBS)         Swallow Study    General HPI: [name]  is a 76 y.o. male, with significant past medical history of coronary artery disease status post CABG with cardiac cath in June 2011 showing two out of four patent grafts and ischemic cardiovascular ejection fraction 20-25%, history of axial flutter and fibrillation on chronic warfarin diabetes mellitus hyperlipidemia and COPD, presents with complaints of chest pain, patient reports chest pain started at rest that before going to sleep, as well he reports that it was accompanied by worsening shortness of breath, denies any palpitation, lightheadedness, nausea, diaphoresis, patient reports he took 4 baby aspirin at home, and the air he received care sublingual nitroglycerin by EMS, where he reports his chest pain has completely resolved, patient's first set of cardiac enzymes was negative, upon presentation to ED patient was found to have diffuse wheezing and decreased air entry, where he received multiple nebulizer treatments for COPD exacerbation Type of Study: Bedside swallow evaluation Previous Swallow Assessment: per patient, edoscopies only to diagnose hiatal hernia Diet Prior to this Study: Regular;Thin liquids Temperature Spikes Noted: No Respiratory  Status: Room air History of Recent Intubation: No Behavior/Cognition: Alert;Cooperative;Pleasant mood Oral Cavity - Dentition: Adequate natural dentition Self-Feeding Abilities: Able to feed self Patient Positioning: Upright in bed Baseline Vocal Quality: Hoarse Volitional Cough: Strong Volitional Swallow: Able to elicit    Oral/Motor/Sensory Function Overall Oral Motor/Sensory Function: Appears within functional limits for tasks assessed   Ice Chips Ice chips: Not tested   Thin Liquid Thin Liquid: Within functional limits Presentation: Cup;Self Fed;Straw    Nectar Thick Nectar Thick Liquid: Not tested   Honey Thick Honey Thick Liquid: Not tested   Puree Puree: Within functional limits Presentation: Spoon;Self Fed   Solid   GO   Tobyn Osgood MA, CCC-SLP [phone]  Solid: Within functional limits Presentation: Self Fed       Bentlee Drier Meryl 01/20/2012,9:45 AM

## 2012-01-20 NOTE — Procedures (Signed)
Objective Swallowing Evaluation: Modified Barium Swallowing Study  Patient Details  Name: Roberto [name] MRN: [id number] Date of Birth: 05-28-1935  Today's Date: 01/20/2012 Time: 4098-1191 SLP Time Calculation (min): 20 min  Past Medical History:  Past Medical History  Diagnosis Date  . CAD (coronary artery disease)     post CABG with prior percutaneous intervention of the saphenous vein graft with known saphenous vein graft disease.  Marland Kitchen AAA (abdominal aortic aneurysm) 2010    5.8cm  . Aneurysm of thoracic aorta 1995    Rupture 1995 w/spontaneous resolution  . Popliteal artery aneurysm   . Myocardial infarction   . Ischemic cardiomyopathy   . Paroxysmal atrial fibrillation   . Hypertension   . Peripheral vascular disease   . Dyslipidemia   . Diabetes mellitus 2007    dx'd 2007 w/persisting FBS>125  . GERD (gastroesophageal reflux disease)   . Hiatal hernia   . Diverticular disease   . BPH (benign prostatic hypertrophy)     with hx of acute urinary retention (pt can self-cath)  . COPD (chronic obstructive pulmonary disease)     05/09/08 PFT FEV1 1.84 (55%), FVC 4.42 (95%), TLC 6.46 (92%), DLCO 72%, no BD response  . Pneumonia 1/09  . Allergic rhinitis   . Shingles     X 2 episodes, both in left scapula area  . OSA (obstructive sleep apnea) 06/14/09    PSG RDI 17, PLMI 96, intolerant of CPAP or BPAP  . Chronic renal insufficiency     Baseline Cr 1.5  . Prostate cancer 07/2008    Low grade; watchful waiting with [name]  . Pulmonary nodule, left 03/02/2010    Left base; resolved on f/u CT  . Allergic rhinitis   . Carpal tunnel syndrome of left wrist 02/11/2011  . IMPLANTATION OF DEFIBRILLATOR, HX OF 09/02/2008    Medtr[employer] 7232Cx Maximo V. Had a 6949 lead - failed and therapies turned off.  . IRON DEFICIENCY 06/08/2010    Hemoccult negative:  GI ([name]) recommended no invasive diagnostics--watchful waiting (2012).  . UNSPECIFIED ANEMIA 06/01/2010   Multifactorial: iron def, CRI, vit B12 def,    Past Surgical History:  Past Surgical History  Procedure Date  . Cardiac defibrillator placement 08/28/2004    [name]  . Colon surgery 1995    partial colectomy  . Coronary artery bypass graft 1996  . Aorta - bilateral femoral artery bypass graft 1999    [name] Early  . Nose surgery   . Insertion of pacing lead     New rate sensing pacing lead with removal of a previous implanted ICD and insertion of a device back in the pocket with defibrillation threshold testing  . Abdominal aortic aneurysm repair 1999    Ingrarenal repair/ graft  . Stents     Placed to the saphenous vein graft  . Radiofrequency ablation 2006    A flutter   HPI:  Roberto [name]  is a 76 y.o. male, with significant past medical history of coronary artery disease status post CABG with cardiac cath in June 2011 showing two out of four patent grafts and ischemic cardiovascular ejection fraction 20-25%, history of axial flutter and fibrillation on chronic warfarin diabetes mellitus hyperlipidemia and COPD, presents with complaints of chest pain, patient reports chest pain started at rest that before going to sleep, as well he reports that it was accompanied by worsening shortness of breath, denies any palpitation, lightheadedness, nausea, diaphoresis, patient reports he took 4 baby  aspirin at home, and the air he received care sublingual nitroglycerin by EMS, where he reports his chest pain has completely resolved, patient's first set of cardiac enzymes was negative, upon presentation to ED patient was found to have diffuse wheezing and decreased air entry, where he received multiple nebulizer treatments for COPD exacerbation     Assessment / Plan / Recommendation Clinical Impression  Dysphagia Diagnosis: Suspected primary esophageal dysphagia Clinical impression: Patient presents with a suspected primary esophageal dysphagia characterized by mildly decreaed UES  relaxation and trace pharyngeal residuals post swallow despite what appears to be a timely swallow reflex and appropriate hyo-laryngeal elevation and excursion. Residuals likely a result of decreased UES relaxation and what appeared to be lower esophageal residuals post swallow (MD not present to confirm). No aspiration or penetration observed. Suspect esophageal defcitis related to known h/o GERD and hiatal hernia. MD, if feel necessary, f/u GI consult may be beneficial to assess for changes in function. Otherwise, patient appropriate to remain on a regular diet with utilization of reflux/esophageal precautions to eleviate symptoms and decreased chances of a reflux related aspiration episode.     Treatment Recommendation  No treatment recommended at this time    Diet Recommendation Regular;Thin liquid   Liquid Administration via: Cup;Straw Medication Administration: Whole meds with liquid Supervision: Patient able to self feed;Intermittent supervision to cue for compensatory strategies Compensations: Slow rate;Small sips/bites Postural Changes and/or Swallow Maneuvers: Seated upright 90 degrees;Upright 30-60 min after meal    Other  Recommendations Recommended Consults: Consider GI evaluation (per MD if feels beneficial) Oral Care Recommendations: Oral care BID   Follow Up Recommendations  None            General HPI: Roberto [name]  is a 76 y.o. male, with significant past medical history of coronary artery disease status post CABG with cardiac cath in June 2011 showing two out of four patent grafts and ischemic cardiovascular ejection fraction 20-25%, history of axial flutter and fibrillation on chronic warfarin diabetes mellitus hyperlipidemia and COPD, presents with complaints of chest pain, patient reports chest pain started at rest that before going to sleep, as well he reports that it was accompanied by worsening shortness of breath, denies any palpitation, lightheadedness, nausea,  diaphoresis, patient reports he took 4 baby aspirin at home, and the air he received care sublingual nitroglycerin by EMS, where he reports his chest pain has completely resolved, patient's first set of cardiac enzymes was negative, upon presentation to ED patient was found to have diffuse wheezing and decreased air entry, where he received multiple nebulizer treatments for COPD exacerbation Type of Study: Modified Barium Swallowing Study Reason for Referral: Objectively evaluate swallowing function Previous Swallow Assessment: per patient, edoscopies only to diagnose hiatal hernia Diet Prior to this Study: Regular;Thin liquids Temperature Spikes Noted: No Respiratory Status: Room air History of Recent Intubation: No Behavior/Cognition: Alert;Cooperative;Pleasant mood Oral Cavity - Dentition: Adequate natural dentition Oral Motor / Sensory Function: Within functional limits Self-Feeding Abilities: Able to feed self Patient Positioning: Upright in chair Baseline Vocal Quality: Hoarse Volitional Cough: Strong Volitional Swallow: Able to elicit Anatomy: Within functional limits Pharyngeal Secretions: Not observed secondary MBS    Reason for Referral Objectively evaluate swallowing function      Pharyngeal Phase Pharyngeal Phase: Impaired   Cervical Esophageal Phase    GO   Ferdinand Lango MA, CCC-SLP [phone]  Cervical Esophageal Phase: Impaired    Gem Conkle Meryl 01/20/2012, 11:19 AM

## 2012-01-20 NOTE — Progress Notes (Signed)
Subjective:  He is better today.  He had pain, but after an evening of barbecue, and other items at a class reunion.  He feels much better now.  Prior cath data from 2011 reviewed---occluded SVG to both the PDA and OM.  Patent diagonal and LIMA to the LAD.  The OM was not favorable for PCI.  Feels better today.    Objective:  Vital Signs in the last 24 hours: Temp:  [97.4 F (36.3 C)-98.3 F (36.8 C)] 97.4 F (36.3 C) (09/09 0545) Pulse Rate:  [65-81] 69  (09/09 0545) Resp:  [16-18] 18  (09/09 0545) BP: (107-183)/(67-106) 137/85 mmHg (09/09 0545) SpO2:  [94 %-99 %] 99 % (09/09 0545) Weight:  [165 lb (74.844 kg)] 165 lb (74.844 kg) (09/08 0910)  Intake/Output from previous day: 09/08 0701 - 09/09 0700 In: 480 [P.O.:480] Out: -    Physical Exam: General: Well developed, well nourished, in no acute distress. Head:  Normocephalic and atraumatic. Lungs: Prolonged exp with mild expiratory wheezing.   Heart: Normal S1 and S2.  No murmur, rubs or gallops.  Extremities: No clubbing or cyanosis. No edema. Neurologic: Alert and oriented x 3.    Lab Results:  Basename 01/20/12 0127 01/19/12 0233  WBC 10.5 9.0  HGB 11.8* 12.1*  PLT 165 163    Basename 01/20/12 0127 01/19/12 0233  NA 137 136  K 4.4 4.1  CL 104 102  CO2 25 28  GLUCOSE 149* 94  BUN 28* 33*  CREATININE 1.23 1.68*    Basename 01/19/12 1340 01/19/12 0733  TROPONINI 0.32* 0.40*   Hepatic Function Panel No results found for this basename: PROT,ALBUMIN,AST,ALT,ALKPHOS,BILITOT,BILIDIR,IBILI in the last 72 hours No results found for this basename: CHOL in the last 72 hours No results found for this basename: PROTIME in the last 72 hours  Imaging: Dg Chest 2 View  01/19/2012  *RADIOLOGY REPORT*  Clinical Data: Sternal chest pain.  Shortness of breath.  CHEST - 2 VIEW  Comparison: 06/06/2011  Findings: Stable postoperative changes in the mediastinum.  Stable appearance of cardiac pacemaker.  Calcification of the aorta  with tortuosity and ectatic descending aorta.  Aortic shadow appears stable since the previous study.  Normal heart size and pulmonary vascularity.  Emphysematous changes in the lungs.  Fibrosis or linear atelectasis in the left lung base.  No blunting of costophrenic angles.  No pneumothorax.  Degenerative changes in the spine.  IMPRESSION: Tortuous and dilated thoracic aorta appears stable since previous study.  Emphysematous changes and fibrosis in the lungs.  No active consolidation.   Original Report Authenticated By: Marlon Pel, M.D.     EKG:  Some lateral T inversion  (also seen more prominently in 11/2011)    Assessment/Plan:  Patient Active Hospital Problem List: Non STEMI    Borderline enzyme elevation could be secondary to ischemia or LV dysfunction.  CKMB essentially normal.      Rec:  See cath data.  He has multiple issues, and I would be inclined to ambulate him today, and see how he does, restart warfarin for atrial fib later today if he is symptom free.  Our interventional and surgical options are limited, so would prefer to avoid cardiac cath at this point.  He and his wife understand this pretty well as he has declined high risk aortic surgery in [place].  He is amenable to this, and suspects that dietary indiscretion may have been a component of his presentation.  [name], MD, [facility], FSCAI 01/20/2012, 8:13 AM

## 2012-01-20 NOTE — Discharge Summary (Signed)
Physician Discharge Summary  Roberto [name] ION:[id number] DOB: 1936-04-06 DOA: 01/19/2012  PCP: Jeoffrey Massed, MD  Admit date: 01/19/2012 Discharge date: 01/20/2012  Recommendations for Outpatient Follow-up:  1. Patient will followup with [facility] [facility] for EGD to assess for esophageal deficits 2. Patient will follow up with cardiology in the next few weeks to follow up on his blood pressures  Discharge Diagnoses:  Active Problems:  DIABETES MELLITUS, TYPE II  HYPERLIPIDEMIA  HYPERTENSION  CAD, ARTERY BYPASS GRAFT  ISCHEMIC CARDIOMYOPATHY  Atrial fibrillation  HYPOTHYROIDISM  Chest pain  COPD exacerbation  Acute coronary syndrome  Non-STEMI (non-ST elevated myocardial infarction)   Discharge Condition: Improved, being discharged home  Diet recommendation: Heart healthy, carb modified  Filed Weights   01/19/12 0910  Weight: 74.844 kg (165 lb)    History of present illness:  Roberto [name] is a 76 y.o. male, with significant past medical history of coronary artery disease status post CABG with cardiac cath in June 2011 showing two out of four patent grafts and ischemic cardiovascular ejection fraction 20-25%, history of axial flutter and fibrillation on chronic warfarin diabetes mellitus hyperlipidemia and COPD, presents with complaints of chest pain, patient reports chest pain started at rest that before going to sleep, as well he reports that it was accompanied by worsening shortness of breath, denies any palpitation, lightheadedness, nausea, diaphoresis, patient reports he took 4 baby aspirin at home, and the air he received care sublingual nitroglycerin by EMS, where he reports his chest pain has completely resolved, patient's first set of cardiac enzymes was negative, upon presentation to ED patient was found to have diffuse wheezing and decreased air entry, where he received multiple nebulizer treatments for COPD exacerbation, hospitalist service we are requested to  admit the patient for his COPD exacerbation and cardiac rule out for his chest pain .   Hospital Course:  1. chest pain, patient is known to have history of coronary artery disease, with last cardiac cath showing two out of four patent grafts, received 324 mg of aspirin, we'll cycle troponins, first set of cardiac enzyme is negative, but repeat point-of-care troponin is marginally elevated cardiology was consult in follow the patient on hospital day 2. This point the patient was started to have more active chest pain requiring medication and nitrates. Cardiology recommended more aggressive blood pressure control with titrating up his Coreg and Imdur were and adding Norvasc..   they reviewed his previous catheterization done on 2011 noting an occluded SVG to both the PDA and OM with the OM not favorable for PCI. With resolution of his chest pain by hospital day 2 it is felt best to medically manage this. Patient was amenable to plan which avoided another catheterization which the cardiologist agreed with. He will follow up as an outpatient with cardiology.  2. COPD exacerbation, patient will be continued on his home medication, and will start him on nebulizer treatment, and IV  Solu-Medrol. The hospital and 2, the patient was breathing much more comfortably. He did report some choking often with eating and drinking so speech therapy was consulted to see the patient. Patient passed both the bedside and modified barium swallow who recommended continuing or diet with swallowing precautions but no restrictions. He also recommended GI followup to assess the esophagus for deficits. Patient will follow up with GI as an outpatient for possible EGD.  3. Acute on chronic renal failure, patient had elevated creatinine from his baseline, from 1.4-1.68, will hold Lasix and ACE inhibitor.  possibly 3,  his creatinine was down to 1.23. At this point these medicines were restarted.  4. Atrial fibrillation, currently is rate  controlled, patient is normal sinus rhythm, and is on anticoagulation, will have pharmacy to dose warfarin.  INR remained therapeutic  5. Coronary artery disease, and ischemic cardiomyopathy: Last echo showing ejection fraction of 30%, currently patient appears to be euvolemic, will hold Lasix as he is in acute renal failure, will continue him on aspirin, beta blockers, patient could not tolerate statins previously due to myalgias.  see above.  6. Hypothyroidism, continue with Synthroid,  stable.  7. Diabetes mellitus,  patient is continued on sliding scale plus his Januvia. He will continue on his home medicines.  8. Hyperlipidemia, patient can't tolerate statin do to myalagia   9. Bipolar disorder, continue with lithium   Procedures:  None  Consultations:  Thomas Stuckey-cardiology  Discharge Exam: Filed Vitals:   01/19/12 2117 01/20/12 0545 01/20/12 0831 01/20/12 1028  BP:  137/85  111/69  Pulse: 68 69  69  Temp:  97.4 F (36.3 C)    TempSrc:  Oral    Resp: 18 18    Height:      Weight:      SpO2:  99% 96%     General: Alert and oriented x3, no acute distress HEENT: Normocephalic, atraumatic, mucous nares are moist Cardiovascular: Normal sinus rhythm, S1-S2, soft 2/6 systolic ejection murmur Respiratory: Decreased breath sounds throughout with no wheezing-this is improved from the previous day Abdomen: Soft, nontender, nondistended, positive bowel sounds Extremities: No clubbing or cyanosis, trace pitting edema  Discharge Instructions  Discharge Orders    Future Appointments: Provider: Department: Dept Phone: Center:   01/29/2012 3:15 PM Jeoffrey Massed, MD [facility] [phone] None   02/13/2012 12:30 PM [facility] [phone] None   03/16/2012 3:00 PM Herby Abraham, MD [facility] [phone] [facility]     Future Orders Please Complete By Expires   Diet - low sodium heart healthy      Increase  activity slowly        Medication List  As of 01/20/2012  3:10 PM   STOP taking these medications         esomeprazole 40 MG capsule         TAKE these medications         acetaminophen 325 MG tablet   Commonly known as: TYLENOL   Take 325 mg by mouth every 6 (six) hours as needed. For pain      albuterol (2.5 MG/3ML) 0.083% nebulizer solution   Commonly known as: PROVENTIL   Take 2.5 mg by nebulization every 6 (six) hours as needed. For shortness of breath      amiodarone 200 MG tablet   Commonly known as: PACERONE   Take 200 mg by mouth daily.      amLODipine 5 MG tablet   Commonly known as: NORVASC   Take 1 tablet (5 mg total) by mouth daily.      aspirin 81 MG tablet   Take 81 mg by mouth daily.      budesonide 0.25 MG/2ML nebulizer solution   Commonly known as: PULMICORT   Take 2 mLs (0.25 mg total) by nebulization 2 (two) times daily.      budesonide 32 MCG/ACT nasal spray   Commonly known as: RHINOCORT AQUA   Place 2 sprays into the nose daily.      carvedilol 6.25 MG tablet   Commonly known  as: COREG   Take 1 tablet (6.25 mg total) by mouth 2 (two) times daily.      cetirizine 10 MG tablet   Commonly known as: ZYRTEC   Take 10 mg by mouth daily.      clotrimazole-betamethasone cream   Commonly known as: LOTRISONE   Apply 1 application topically 2 (two) times daily as needed. For skin irritation      cyanocobalamin 1000 MCG/ML injection   Commonly known as: (VITAMIN B-12)   Inject into the muscle every 30 (thirty) days.      desoximetasone 0.25 % cream   Commonly known as: TOPICORT   Apply 1 application topically 2 (two) times daily as needed. For rash      finasteride 5 MG tablet   Commonly known as: PROSCAR   Take 5 mg by mouth daily.      FLOMAX 0.4 MG Caps   Generic drug: Tamsulosin HCl   Take 0.4 mg by mouth daily.      furosemide 40 MG tablet   Commonly known as: LASIX   Take 1 tablet (40 mg total) by mouth daily.      guaiFENesin 600  MG 12 hr tablet   Commonly known as: MUCINEX   Take 1,200 mg by mouth 2 (two) times daily as needed. For congestion      guaiFENesin-dextromethorphan 100-10 MG/5ML syrup   Commonly known as: ROBITUSSIN DM   Take 5 mLs by mouth 3 (three) times daily as needed. As needed for cough      ipratropium 0.03 % nasal spray   Commonly known as: ATROVENT   Two sprays three times a day as needed before meals      isosorbide mononitrate 120 MG 24 hr tablet   Commonly known as: IMDUR   Take 1 tablet (120 mg total) by mouth every morning.      KLOR-CON 10 10 MEQ tablet   Generic drug: potassium chloride   TAKE 1 TABLET BY MOUTH EVERY DAY      levalbuterol 45 MCG/ACT inhaler   Commonly known as: XOPENEX HFA   Inhale 1-2 puffs into the lungs every 4 (four) hours as needed. For shortness of breath      levothyroxine 125 MCG tablet   Commonly known as: SYNTHROID, LEVOTHROID   Take 125 mcg by mouth daily. Take a whole tablet Monday, Wednesday, Fridays.  Take a a 1/2 tablet on Sundays, Tuesdays, Thursdays, and Saturdays.      lithium carbonate 300 MG CR tablet   Commonly known as: LITHOBID   Take 300 mg by mouth 2 (two) times daily.      nitroGLYCERIN 0.3 MG SL tablet   Commonly known as: NITROSTAT   Place 0.3 mg under the tongue every 5 (five) minutes as needed. For chest pain      NON FORMULARY   2 Act daily. Flutter Valve.      NON FORMULARY   as directed. Home nebulizer.      olmesartan 5 MG tablet   Commonly known as: BENICAR   Take 1 tablet (5 mg total) by mouth daily.      omeprazole 20 MG tablet   Commonly known as: PRILOSEC OTC   Take 1 tablet (20 mg total) by mouth 2 (two) times daily.      sitaGLIPtin 100 MG tablet   Commonly known as: JANUVIA   Take 1 tablet (100 mg total) by mouth daily.      tiotropium 18 MCG inhalation capsule  Commonly known as: SPIRIVA   Place 1 capsule (18 mcg total) into inhaler and inhale daily.      warfarin 5 MG tablet   Commonly known  as: COUMADIN   Take 2.5-5 mg by mouth daily. Take 1/2 TAB DAILY except on Friday then take 1 tab           Follow-up Information    Follow up with MCGOWEN,PHILIP H, MD. Schedule an appointment as soon as possible for a visit in 1 month.   Contact information:   [employer] [address] [place] [address] [phone]       Follow up with [facility]. Schedule an appointment as soon as possible for a visit in 1 month. (For an EGD)    Contact information:   [address] [place] [address] [phone]      Follow up with [name], MD. Schedule an appointment as soon as possible for a visit in 2 weeks.   Contact information:   [address] [place] [address] [phone]           The results of significant diagnostics from this hospitalization (including imaging, microbiology, ancillary and laboratory) are listed below for reference.    Significant Diagnostic Studies: Dg Chest 2 View 01/19/2012    IMPRESSION: Tortuous and dilated thoracic aorta appears stable since previous study.  Emphysematous changes and fibrosis in the lungs.  No active consolidation.   Original Report Authenticated By: Marlon Pel, M.D.      Labs: Basic Metabolic Panel:  Lab 01/20/12 1027 01/19/12 0233 01/15/12 1501  NA 137 136 136  K 4.4 4.1 4.3  CL 104 102 103  CO2 25 28 29   GLUCOSE 149* 94 92  BUN 28* 33* 29*  CREATININE 1.23 1.68* 1.4  CALCIUM 10.3 9.8 9.5  MG -- -- --  PHOS -- -- --   Liver Function Tests:  Lab 01/15/12 1501  AST 15  ALT 15  ALKPHOS 61  BILITOT 0.6  PROT 6.3  ALBUMIN 3.7   CBC:  Lab 01/20/12 0127 01/19/12 0233 01/15/12 1501  WBC 10.5 9.0 7.1  NEUTROABS -- -- 5.6  HGB 11.8* 12.1* 11.9*  HCT 37.3* 38.1* 36.8*  MCV 92.1 93.6 91.2  PLT 165 163 145.0*   Cardiac Enzymes:  Lab 01/20/12 0127 01/19/12 1830 01/19/12 1340 01/19/12 0733    CKTOTAL 46 60 -- 69  CKMB 4.0 4.3* -- 3.8  CKMBINDEX -- -- -- --  TROPONINI -- -- 0.32* 0.40*   CBG:  Lab 01/20/12 1148 01/20/12 1139 01/20/12 0741 01/19/12 2051 01/19/12 1626  GLUCAP 167* 166* 152* 142* 221*    Time coordinating discharge: 35 minutes  Signed:  Hollice Espy  [facility] 01/20/2012, 3:10 PM

## 2012-01-20 NOTE — Progress Notes (Signed)
Utilization review completed.  

## 2012-01-21 ENCOUNTER — Telehealth: Payer: Self-pay | Admitting: Cardiology

## 2012-01-21 ENCOUNTER — Telehealth: Payer: Self-pay | Admitting: Family Medicine

## 2012-01-21 ENCOUNTER — Encounter: Payer: Self-pay | Admitting: Gastroenterology

## 2012-01-21 NOTE — Telephone Encounter (Signed)
Roberto [name] had recent admit at [facility]. Roberto [name] states he was given a third medication for his breathing in the hospital. Roberto [name] states he doesn't know the name of the med but that he will need a refill of it before he comes for his appt next week. Roberto [name] wants to know if Dr Milinda Cave can see the name of the med in his chart & can he send in an Rx?

## 2012-01-21 NOTE — Telephone Encounter (Signed)
Compared last OV med list and current list.  Message left for pt to return call to discuss medications.

## 2012-01-27 ENCOUNTER — Telehealth: Payer: Self-pay | Admitting: Family Medicine

## 2012-01-27 NOTE — Telephone Encounter (Signed)
Have him stop his amlodipine.  Can keep appt for Wednesday unless he continues to worsen.  I'd be glad to see him tomorrow if he and Chip Boer prefer.-thx

## 2012-01-27 NOTE — Telephone Encounter (Signed)
Vicky notified.  She will talk to Rashid to see if he wants to come tomorrow instead.

## 2012-01-27 NOTE — Telephone Encounter (Signed)
Patient's bp readings today were 105/50, yesterday the bottom # was 47 & patient seems tired. Patient's wife states the doctor at the hospital prescribed Amlodipine. The patient's wife would like to know if this new med could be causing the low bp readings and the lethargy.

## 2012-01-27 NOTE — Telephone Encounter (Signed)
Vicky states pt is weak.  Not sleeping.  New medication-amlodipine-added while in hospital.  Pt is having diastolics in the 40s-50s.  Would like to know if he is taking too much BP med. Also, pt was bit by centipede Friday night.  Pink area about the size of a 50 cent piece on arm.  Pt was advised by pharmacy to take benadryl if he started to have any rash, but he has not needed. Pt has appt on Wednesday.  Does pt need to be seen sooner or adjust any BP meds?

## 2012-01-27 NOTE — Telephone Encounter (Signed)
Per Chip Boer, does not need any refills at this time.

## 2012-01-29 ENCOUNTER — Ambulatory Visit (INDEPENDENT_AMBULATORY_CARE_PROVIDER_SITE_OTHER): Payer: Medicare Other | Admitting: Family Medicine

## 2012-01-29 VITALS — BP 103/67 | HR 52 | Temp 97.2°F | Resp 16 | Wt 166.0 lb

## 2012-01-29 DIAGNOSIS — R5381 Other malaise: Secondary | ICD-10-CM

## 2012-01-29 DIAGNOSIS — R5383 Other fatigue: Secondary | ICD-10-CM

## 2012-01-29 DIAGNOSIS — J449 Chronic obstructive pulmonary disease, unspecified: Secondary | ICD-10-CM

## 2012-01-29 DIAGNOSIS — Z23 Encounter for immunization: Secondary | ICD-10-CM

## 2012-01-31 ENCOUNTER — Encounter: Payer: Self-pay | Admitting: Family Medicine

## 2012-01-31 NOTE — Assessment & Plan Note (Signed)
Seems stable at this time.  No new meds.

## 2012-01-31 NOTE — Assessment & Plan Note (Signed)
With periodic dizziness/low bp and recent mild increased in pulmonary complaints. No sign of acute COPD exacerbation currently.  This could all be slightly worsened baseline CAD with polypharmacy. He'll continue his coreg at 6.25mg  bid dosing and we'll see if he adjusts to this better over time.  Continue imdur, lasix. He'll f/u with GI for upper endoscopy--currently taking omeprazole 20mg  bid.

## 2012-01-31 NOTE — Progress Notes (Signed)
OFFICE NOTE  01/31/2012  CC: No chief complaint on file.    HPI: Patient is a 76 y.o. Caucasian male who is here for f/u recent hospitalization for unstable angina. His coreg was increased to 6.25mg  bid, norvasc was added, imdur dosing at d/c was 120mg  qd (which is what I believe it was prior to admission).  He was having low bp and dizziness soon after d/c home so I recommended he d/c his norvasc.  This has been better, but he still complains of generalized fatigue/malaise.  No specific sx's such as chest pain, palpitations, nausea, diaphoresis.  No LE swelling. He had mild ARF in hosp but this came down with holding lasix and he was d/c'd home back on his lasix. There was question in hosp as to whether he was having GERD episodes and aspiration periodically, so he has GI f/u set up for EGD.  He reports that his bp was 125/70 at home this morning.    Pertinent PMH:  Past Medical History  Diagnosis Date  . CAD (coronary artery disease)     post CABG with prior percutaneous intervention of the saphenous vein graft with known saphenous vein graft disease.  Marland Kitchen AAA (abdominal aortic aneurysm) 2010    5.8cm  . Aneurysm of thoracic aorta 1995    Rupture 1995 w/spontaneous resolution  . Popliteal artery aneurysm   . Myocardial infarction   . Ischemic cardiomyopathy   . Paroxysmal atrial fibrillation   . Hypertension   . Peripheral vascular disease   . Dyslipidemia   . Diabetes mellitus 2007    dx'd 2007 w/persisting FBS>125  . GERD (gastroesophageal reflux disease)   . Hiatal hernia   . Diverticular disease   . BPH (benign prostatic hypertrophy)     with hx of acute urinary retention (pt can self-cath)  . COPD (chronic obstructive pulmonary disease)     05/09/08 PFT FEV1 1.84 (55%), FVC 4.42 (95%), TLC 6.46 (92%), DLCO 72%, no BD response  . Pneumonia 1/09  . Allergic rhinitis   . Shingles     X 2 episodes, both in left scapula area  . OSA (obstructive sleep apnea) 06/14/09    PSG  RDI 17, PLMI 96, intolerant of CPAP or BPAP  . Chronic renal insufficiency     Baseline Cr 1.5  . Prostate cancer 07/2008    Low grade; watchful waiting with [name]  . Pulmonary nodule, left 03/02/2010    Left base; resolved on f/u CT  . Allergic rhinitis   . Carpal tunnel syndrome of left wrist 02/11/2011  . IMPLANTATION OF DEFIBRILLATOR, HX OF 09/02/2008    Medtronic 7232Cx Maximo V. Had a 6949 lead - failed and therapies turned off.  . IRON DEFICIENCY 06/08/2010    Hemoccult negative:  GI ([name]) recommended no invasive diagnostics--watchful waiting (2012).  . UNSPECIFIED ANEMIA 06/01/2010    Multifactorial: iron def, CRI, vit B12 def,     MEDS:  Outpatient Prescriptions Prior to Visit  Medication Sig Dispense Refill  . acetaminophen (TYLENOL) 325 MG tablet Take 325 mg by mouth every 6 (six) hours as needed. For pain      . albuterol (PROVENTIL) (2.5 MG/3ML) 0.083% nebulizer solution Take 2.5 mg by nebulization every 6 (six) hours as needed. For shortness of breath      . amiodarone (PACERONE) 200 MG tablet Take 200 mg by mouth daily.       Marland Kitchen amLODipine (NORVASC) 5 MG tablet Take 1 tablet (5 mg total) by  mouth daily.  30 tablet  0  . aspirin 81 MG tablet Take 81 mg by mouth daily.        . budesonide (PULMICORT) 0.25 MG/2ML nebulizer solution Take 2 mLs (0.25 mg total) by nebulization 2 (two) times daily.  60 mL  5  . budesonide (RHINOCORT AQUA) 32 MCG/ACT nasal spray Place 2 sprays into the nose daily.  1 Bottle  12  . carvedilol (COREG) 6.25 MG tablet Take 1 tablet (6.25 mg total) by mouth 2 (two) times daily.  60 tablet  0  . cetirizine (ZYRTEC) 10 MG tablet Take 10 mg by mouth daily.        . clotrimazole-betamethasone (LOTRISONE) cream Apply 1 application topically 2 (two) times daily as needed. For skin irritation      . cyanocobalamin 1000 MCG/ML injection Inject into the muscle every 30 (thirty) days.        Marland Kitchen desoximetasone (TOPICORT) 0.25 % cream Apply 1  application topically 2 (two) times daily as needed. For rash      . finasteride (PROSCAR) 5 MG tablet Take 5 mg by mouth daily.        . furosemide (LASIX) 40 MG tablet Take 1 tablet (40 mg total) by mouth daily.  30 tablet  3  . guaiFENesin (MUCINEX) 600 MG 12 hr tablet Take 1,200 mg by mouth 2 (two) times daily as needed. For congestion      . guaiFENesin-dextromethorphan (ROBITUSSIN DM) 100-10 MG/5ML syrup Take 5 mLs by mouth 3 (three) times daily as needed. As needed for cough      . ipratropium (ATROVENT) 0.03 % nasal spray Two sprays three times a day as needed before meals      . isosorbide mononitrate (IMDUR) 120 MG 24 hr tablet Take 1 tablet (120 mg total) by mouth every morning.  30 tablet  0  . KLOR-CON 10 10 MEQ tablet TAKE 1 TABLET BY MOUTH EVERY DAY  30 tablet  6  . levalbuterol (XOPENEX HFA) 45 MCG/ACT inhaler Inhale 1-2 puffs into the lungs every 4 (four) hours as needed. For shortness of breath  1 Inhaler  6  . levothyroxine (SYNTHROID, LEVOTHROID) 125 MCG tablet Take 125 mcg by mouth daily. Take a whole tablet Monday, Wednesday, Fridays. Take a a 1/2 tablet on Sundays, Tuesdays, Thursdays, and Saturdays.      Marland Kitchen lithium carbonate (LITHOBID) 300 MG CR tablet Take 300 mg by mouth 2 (two) times daily.      . nitroGLYCERIN (NITROSTAT) 0.3 MG SL tablet Place 0.3 mg under the tongue every 5 (five) minutes as needed. For chest pain      . NON FORMULARY 2 Act daily. Flutter Valve.       . NON FORMULARY as directed. Home nebulizer.       Marland Kitchen olmesartan (BENICAR) 5 MG tablet Take 1 tablet (5 mg total) by mouth daily.  30 tablet  5  . omeprazole (PRILOSEC OTC) 20 MG tablet Take 1 tablet (20 mg total) by mouth 2 (two) times daily.  60 tablet  0  . sitaGLIPtin (JANUVIA) 100 MG tablet Take 1 tablet (100 mg total) by mouth daily.  30 tablet  3  . Tamsulosin HCl (FLOMAX) 0.4 MG CAPS Take 0.4 mg by mouth daily.        Marland Kitchen tiotropium (SPIRIVA) 18 MCG inhalation capsule Place 1 capsule (18 mcg total)  into inhaler and inhale daily.  30 capsule  5  . warfarin (COUMADIN) 5 MG tablet Take  2.5-5 mg by mouth daily. Take 1/2 TAB DAILY except on Friday then take 1 tab        PE: Blood pressure 103/67, pulse 52, temperature 97.2 F (36.2 C), temperature source Temporal, resp. rate 16, weight 166 lb (75.297 kg), SpO2 97.00%. Gen: Alert, well appearing.  Patient is oriented to person, place, time, and situation. ENT: Eyes: no injection, icteris, swelling, or exudate.  EOMI, PERRLA. Nose: no drainage or turbinate edema/swelling.  No injection or focal lesion.  Mouth: lips without lesion/swelling.  Oral mucosa pink and moist.  Dentition intact and without obvious caries or gingival swelling.  Oropharynx without erythema, exudate, or swelling.  Neck - No masses or thyromegaly or limitation in range of motion CV: RRR  LUNGS: CTA bilat, nonlabored resps, good aeration in all lung fields. ABD: soft, NT, ND. EXT: no clubbing, cyanosis, or edema.   LABS: none today  IMPRESSION AND PLAN:  Fatigue With periodic dizziness/low bp and recent mild increased in pulmonary complaints. No sign of acute COPD exacerbation currently.  This could all be slightly worsened baseline CAD with polypharmacy. He'll continue his coreg at 6.25mg  bid dosing and we'll see if he adjusts to this better over time.  Continue imdur, lasix. He'll f/u with GI for upper endoscopy--currently taking omeprazole 20mg  bid.  COPD Seems stable at this time.  No new meds.     FOLLOW UP: 1 mo

## 2012-02-04 ENCOUNTER — Encounter: Payer: Medicare Other | Admitting: Nurse Practitioner

## 2012-02-04 ENCOUNTER — Ambulatory Visit (INDEPENDENT_AMBULATORY_CARE_PROVIDER_SITE_OTHER): Payer: Medicare Other | Admitting: Nurse Practitioner

## 2012-02-04 ENCOUNTER — Encounter: Payer: Self-pay | Admitting: Nurse Practitioner

## 2012-02-04 VITALS — BP 108/64 | HR 53 | Ht 71.0 in | Wt 170.0 lb

## 2012-02-04 DIAGNOSIS — I5022 Chronic systolic (congestive) heart failure: Secondary | ICD-10-CM

## 2012-02-04 DIAGNOSIS — K219 Gastro-esophageal reflux disease without esophagitis: Secondary | ICD-10-CM

## 2012-02-04 DIAGNOSIS — I509 Heart failure, unspecified: Secondary | ICD-10-CM

## 2012-02-04 DIAGNOSIS — I2589 Other forms of chronic ischemic heart disease: Secondary | ICD-10-CM

## 2012-02-04 DIAGNOSIS — I428 Other cardiomyopathies: Secondary | ICD-10-CM

## 2012-02-04 DIAGNOSIS — I959 Hypotension, unspecified: Secondary | ICD-10-CM

## 2012-02-04 DIAGNOSIS — I25810 Atherosclerosis of coronary artery bypass graft(s) without angina pectoris: Secondary | ICD-10-CM

## 2012-02-04 DIAGNOSIS — I255 Ischemic cardiomyopathy: Secondary | ICD-10-CM

## 2012-02-04 DIAGNOSIS — R5383 Other fatigue: Secondary | ICD-10-CM

## 2012-02-04 MED ORDER — CARVEDILOL 3.125 MG PO TABS: 3.1250 mg | ORAL_TABLET | Freq: Two times a day (BID) | ORAL | Status: DC

## 2012-02-04 NOTE — Progress Notes (Signed)
Patient Name: Roberto [name] Date of Encounter: 02/04/2012  Primary Care Provider:  Jeoffrey Massed, MD Primary Cardiologist:  T. Riley Kill, MD  Patient Profile  76 year old male with history of CAD and ischemic myopathy who presents for hospital followup.  Problem List   Past Medical History  Diagnosis Date  . CAD (coronary artery disease)     post CABG with prior percutaneous intervention of the saphenous vein graft with known saphenous vein graft disease.  Marland Kitchen AAA (abdominal aortic aneurysm)     a. CTA 09/2011: 4.1-4.2cm infrarenal AAA.  Marland Kitchen Aneurysm of thoracic aorta 1995    a. Rupture 1995 w/spontaneous resolution;  b. CTA 09/2011: 6.8cm dist, 5.7cm mid  . Popliteal artery aneurysm   . Myocardial infarction   . Ischemic cardiomyopathy   . Paroxysmal atrial fibrillation   . Hypertension   . Peripheral vascular disease   . Dyslipidemia   . Diabetes mellitus 2007    dx'd 2007 w/persisting FBS>125  . GERD (gastroesophageal reflux disease)   . Hiatal hernia   . Diverticular disease   . BPH (benign prostatic hypertrophy)     with hx of acute urinary retention (pt can self-cath)  . COPD (chronic obstructive pulmonary disease)     05/09/08 PFT FEV1 1.84 (55%), FVC 4.42 (95%), TLC 6.46 (92%), DLCO 72%, no BD response  . Pneumonia 1/09  . Allergic rhinitis   . Shingles     X 2 episodes, both in left scapula area  . OSA (obstructive sleep apnea) 06/14/09    PSG RDI 17, PLMI 96, intolerant of CPAP or BPAP  . Chronic renal insufficiency     Baseline Cr 1.5  . Prostate cancer 07/2008    Low grade; watchful waiting with [name]  . Pulmonary nodule, left 03/02/2010    Left base; resolved on f/u CT  . Allergic rhinitis   . Carpal tunnel syndrome of left wrist 02/11/2011  . IMPLANTATION OF DEFIBRILLATOR, HX OF 09/02/2008    Medtronic 7232Cx Maximo V. Had a 6949 lead - failed and therapies turned off.  . IRON DEFICIENCY 06/08/2010    Hemoccult negative:  GI ([name])  recommended no invasive diagnostics--watchful waiting (2012).  . UNSPECIFIED ANEMIA 06/01/2010    Multifactorial: iron def, CRI, vit B12 def,    Past Surgical History  Procedure Date  . Cardiac defibrillator placement 08/28/2004    [name]  . Colon surgery 1995    partial colectomy  . Coronary artery bypass graft 1996  . Aorta - bilateral femoral artery bypass graft 1999    [name] Early  . Nose surgery   . Insertion of pacing lead     New rate sensing pacing lead with removal of a previous implanted ICD and insertion of a device back in the pocket with defibrillation threshold testing  . Abdominal aortic aneurysm repair 1999    Ingrarenal repair/ graft  . Stents     Placed to the saphenous vein graft  . Radiofrequency ablation 2006    A flutter   Allergies  Allergies  Allergen Reactions  . Atorvastatin     REACTION: muscle ache in legs  . Crestor (Rosuvastatin Calcium) Other (See Comments)    Lower ext fatigue and soreness- if taken everyday  . Lisinopril Diarrhea  . Morphine     REACTION: AMS - agitation  . Prednisone     REACTION: Can't breath  . Zolpidem Tartrate     REACTION: AMS   HPI  76 year old  male with the above problem list.  He was admitted to the hospital a few weeks ago with epigastric and midsternal chest discomfort which he felt was indigestion.  That said, he did have rise in his troponin to 0.40.  He was evaluated by cardiology and given his complex anatomy, resolution of symptoms, and only mild troponin bump with normal CKs and MBs, decision was made to continue medical therapy.  His beta blocker therapy was doubled.  It should be noted that his blood pressure was quite high on admission.  During hospitalization, he was also at treated for COPD exacerbation.  He also complained of some choking while eating and swallowing but passed bedside swallow evaluation and also modified barium swallow.  He does have followup with GI as an outpatient next  week.  Since his discharge from the hospital, he has been feeling fatigued.  He has chronic dyspnea exertion which he feels is stable and has not had any chest pain.  His blood pressures have been running in the 80s to 90s and at times he's had recorded heart rates in the high 40s.  He states simply  That he doesn't have as much energy as he use to.  He denies pnd, orthopnea, n, v, dizziness, syncope, edema, weight gain, or early satiety.  He weighs himself twice a day and notes that his weight has been in the same one to 2 pound range ever since discharge.  Home Medications  Prior to Admission medications   Medication Sig Start Date End Date Taking? Authorizing Provider  acetaminophen (TYLENOL) 325 MG tablet Take 325 mg by mouth every 6 (six) hours as needed. For pain   Yes Historical Provider, MD  albuterol (PROVENTIL) (2.5 MG/3ML) 0.083% nebulizer solution Take 2.5 mg by nebulization every 6 (six) hours as needed. For shortness of breath   Yes Historical Provider, MD  amiodarone (PACERONE) 200 MG tablet Take 200 mg by mouth daily.  08/12/11  Yes Tonny Bollman, MD  aspirin 81 MG tablet Take 81 mg by mouth daily.     Yes Historical Provider, MD  budesonide (PULMICORT) 0.25 MG/2ML nebulizer solution Take 2 mLs (0.25 mg total) by nebulization 2 (two) times daily. 08/07/11  Yes Coralyn Helling, MD  budesonide (RHINOCORT AQUA) 32 MCG/ACT nasal spray Place 2 sprays into the nose daily. 08/08/11 08/07/12 Yes Jeoffrey Massed, MD  carvedilol (COREG) 3.125 MG tablet Take 1 tablet (3.125 mg total) by mouth 2 (two) times daily. 02/04/12 02/03/13 Yes Ok Anis, NP  cetirizine (ZYRTEC) 10 MG tablet Take 10 mg by mouth daily.     Yes Historical Provider, MD  clotrimazole-betamethasone (LOTRISONE) cream Apply 1 application topically 2 (two) times daily as needed. For skin irritation   Yes Historical Provider, MD  cyanocobalamin 1000 MCG/ML injection Inject into the muscle every 30 (thirty) days.     Yes  Historical Provider, MD  desoximetasone (TOPICORT) 0.25 % cream Apply 1 application topically 2 (two) times daily as needed. For rash   Yes Historical Provider, MD  finasteride (PROSCAR) 5 MG tablet Take 5 mg by mouth daily.     Yes Historical Provider, MD  furosemide (LASIX) 40 MG tablet Take 1 tablet (40 mg total) by mouth daily. 11/15/11  Yes Jeoffrey Massed, MD  guaiFENesin (MUCINEX) 600 MG 12 hr tablet Take 1,200 mg by mouth 2 (two) times daily as needed. For congestion   Yes Historical Provider, MD  guaiFENesin-dextromethorphan (ROBITUSSIN DM) 100-10 MG/5ML syrup Take 5 mLs by mouth  3 (three) times daily as needed. As needed for cough   Yes Historical Provider, MD  ipratropium (ATROVENT) 0.03 % nasal spray Two sprays three times a day as needed before meals 09/10/10 10/08/12 Yes Coralyn Helling, MD  isosorbide mononitrate (IMDUR) 120 MG 24 hr tablet Take 1 tablet (120 mg total) by mouth every morning. 01/20/12 01/19/13 Yes Hollice Espy, MD  KLOR-CON 10 10 MEQ tablet TAKE 1 TABLET BY MOUTH EVERY DAY 09/18/11  Yes Herby Abraham, MD  levalbuterol [facility] HFA) 45 MCG/ACT inhaler Inhale 1-2 puffs into the lungs every 4 (four) hours as needed. For shortness of breath 08/28/11  Yes Coralyn Helling, MD  levothyroxine (SYNTHROID, LEVOTHROID) 125 MCG tablet Take 125 mcg by mouth daily. Take a whole tablet Monday, Wednesday, Fridays. Take a a 1/2 tablet on Sundays, Tuesdays, Thursdays, and Saturdays. 09/23/11  Yes Jeoffrey Massed, MD  lithium carbonate (LITHOBID) 300 MG CR tablet Take 300 mg by mouth 2 (two) times daily. 08/28/11 10/08/12 Yes Jeoffrey Massed, MD  nitroGLYCERIN (NITROSTAT) 0.3 MG SL tablet Place 0.3 mg under the tongue every 5 (five) minutes as needed. For chest pain   Yes Historical Provider, MD  NON FORMULARY 2 Act daily. Flutter Valve.    Yes Historical Provider, MD  NON FORMULARY as directed. Home nebulizer.    Yes Historical Provider, MD  olmesartan (BENICAR) 5 MG tablet Take 1 tablet (5 mg  total) by mouth daily. 10/28/11 10/27/12 Yes Jeoffrey Massed, MD  omeprazole (PRILOSEC OTC) 20 MG tablet Take 1 tablet (20 mg total) by mouth 2 (two) times daily. 01/20/12 01/19/13 Yes Hollice Espy, MD  sitaGLIPtin (JANUVIA) 100 MG tablet Take 1 tablet (100 mg total) by mouth daily. 08/26/11  Yes Jeoffrey Massed, MD  Tamsulosin HCl (FLOMAX) 0.4 MG CAPS Take 0.4 mg by mouth daily.     Yes Historical Provider, MD  tiotropium (SPIRIVA) 18 MCG inhalation capsule Place 1 capsule (18 mcg total) into inhaler and inhale daily. 09/04/11 09/03/12 Yes Coralyn Helling, MD  warfarin (COUMADIN) 5 MG tablet Take 2.5-5 mg by mouth daily. Take 1/2 TAB DAILY except on Friday then take 1 tab   Yes Historical Provider, MD   Review of Systems  As above, he has been feeling fatigued and run down since hospitalization.  He has not had any chest pain.  He has chronic DOE, which has not changed.  All other systems reviewed and are otherwise negative except as noted above.  Physical Exam  Blood pressure 108/64, pulse 53, height 5\' 11"  (1.803 m), weight 170 lb (77.111 kg), SpO2 93.00%.  General: Pleasant, NAD Psych: Normal affect. Neuro: Alert and oriented X 3. Moves all extremities spontaneously. HEENT: Normal  Neck: Supple without bruits or JVD. Lungs:  Resp regular and unlabored, CTA. Heart: RRR no s3, s4.  2/6 syst murmur rusb. Abdomen: Soft, non-tender, non-distended, BS + x 4.  Extremities: No clubbing, cyanosis or edema.  Hands, wrists, forearms ecchymotic bilat.  DP/PT/Radials 2+ and equal bilaterally.  Assessment & Plan  1.  Fatigue: Since hospitalization, Mr. Hennick has been feeling fatigued.  He has stable dyspnea on exertion and has had no chest pain.  Blood pressure is running in the 80s to 90s at home and is off he is also been bradycardic.  His carvedilol was doubled when he was hospitalized from 3.125-6.25 mg b.i.d.  This is likely playing a role in his symptoms.  I have reduced his carvedilol back to  3.125 mg  b.i.d.  2.  Coronary artery disease: Status post recent hospitalization for chest pain with very mild elevation of troponin.  Is not having recurrent chest pain.  As above, I am reducing his beta blocker dose.  He otherwise remains on nitrate therapy.  He is prior intolerance to statins.  3.  Ischemic cardiomyopathy: Though his weight is up 4 pounds on our scale compared to his primary care provider scale, patient says that his weight at home has been rock steady.  He is pretty diligent about weighing himself twice a day and is in a program with [facility] [facility] where his weights are electronically monitored.  His volume on exam looks good.  He continues on beta blocker, though at now a reduced dose, ARB, and diuretic therapy.  4.  Paroxysmal atrial fibrillation: He is regular on exam today and remains on amiodarone and Coumadin with Coumadin clinic follow next week.  5.  Hypertension: If anything his blood pressure has been running low.  Reducing Coreg as above.  He was also on amlodipine in the past and this was stopped recently.  6.  Thoracic and abdominal aortic aneurysms: This is followed locally by Dr. Arbie Cookey and also at [facility].  7.  Diabetes mellitus: Per PCP.  8.  Hypothyroidism continue Synthroid.  TSH was normal on September 4.  9.  Bipolar disorder: Stable on lithium.  10.  Hyperlipidemia he is intolerant to statins secondary to myalgias.  11.  Disposition: He has multiple followup appointments and multiple clinics.  He is scheduled to be seen here in November.  I will not change that appointment and have advised that if symptoms worsen he should call to that appointment.  [name], NP 02/04/2012, 4:50 PM

## 2012-02-04 NOTE — Patient Instructions (Addendum)
**Note De-Identified Satrina Magallanes Obfuscation** Your physician has recommended you make the following change in your medication: stop taking Amlodipine and decrease Coreg to 3.125 mg twice daily  Your physician recommends that you schedule a follow-up appointment in: as previously scheduled

## 2012-02-06 ENCOUNTER — Ambulatory Visit: Payer: Medicare Other | Admitting: Family Medicine

## 2012-02-13 ENCOUNTER — Ambulatory Visit (INDEPENDENT_AMBULATORY_CARE_PROVIDER_SITE_OTHER): Payer: Medicare Other | Admitting: *Deleted

## 2012-02-13 DIAGNOSIS — Z7901 Long term (current) use of anticoagulants: Secondary | ICD-10-CM

## 2012-02-13 DIAGNOSIS — I4891 Unspecified atrial fibrillation: Secondary | ICD-10-CM

## 2012-02-17 ENCOUNTER — Other Ambulatory Visit: Payer: Self-pay | Admitting: *Deleted

## 2012-02-17 ENCOUNTER — Encounter: Payer: Self-pay | Admitting: Gastroenterology

## 2012-02-17 ENCOUNTER — Ambulatory Visit (INDEPENDENT_AMBULATORY_CARE_PROVIDER_SITE_OTHER): Payer: Medicare Other | Admitting: Gastroenterology

## 2012-02-17 VITALS — BP 120/60 | HR 56 | Ht 70.0 in | Wt 168.0 lb

## 2012-02-17 DIAGNOSIS — E119 Type 2 diabetes mellitus without complications: Secondary | ICD-10-CM

## 2012-02-17 DIAGNOSIS — R1310 Dysphagia, unspecified: Secondary | ICD-10-CM

## 2012-02-17 MED ORDER — SITAGLIPTIN PHOSPHATE 100 MG PO TABS: 100.0000 mg | ORAL_TABLET | Freq: Every day | ORAL | Status: DC

## 2012-02-17 NOTE — Telephone Encounter (Signed)
Faxed refill request received from pharmacy for [facility] Last filled by MD on 08/26/11, #30 X 3 Last seen on 01/29/12 Follow up 02/28/12 RX SENT.

## 2012-02-17 NOTE — Patient Instructions (Addendum)
Barium esophagram for swallowing difficulty. Will decide on further testing after that result is back.  You have been scheduled for a Barium Esophogram at [facility] (1st floor of the hospital) on 02/20/12 at 1030 AM . Please arrive 15 minutes prior to your appointment for registration. Make certain not to have anything to eat or drink 6 hours prior to your test. If you need to reschedule for any reason, please contact radiology at [phone] to do so.

## 2012-02-17 NOTE — Progress Notes (Signed)
HPI: This is a  very pleasant 76 year old man and his wife. I am meeting him for the first time today.  They are not sure why they are here today.  A doctor at the hosptial told him to see me during his hospital admission last month. He was in hosp last month for chest pains and eventually he understood that he was having indigestion.  He was having swallowing issues, voice changes and ultimiately at [facility] and that  HE is on coumadin for afib.     Had esophageal dilation a long time ago.  Sometimes drinking water will cause him to choke.  Never had difficulty with foods.    Has EF 30 %, is on Coumadin for atrial fibrillation.  Cardiologist's note while he was hospitalized shows he may have had a minor NSTEMI last month  Has been gaining weight lately.    Review of systems: Pertinent positive and negative review of systems were noted in the above HPI section. Complete review of systems was performed and was otherwise normal.    Past Medical History  Diagnosis Date  . CAD (coronary artery disease)     post CABG with prior percutaneous intervention of the saphenous vein graft with known saphenous vein graft disease.  Marland Kitchen AAA (abdominal aortic aneurysm)     a. CTA 09/2011: 4.1-4.2cm infrarenal AAA.  Marland Kitchen Aneurysm of thoracic aorta 1995    a. Rupture 1995 w/spontaneous resolution;  b. CTA 09/2011: 6.8cm dist, 5.7cm mid  . Popliteal artery aneurysm   . Myocardial infarction   . Ischemic cardiomyopathy   . Paroxysmal atrial fibrillation   . Hypertension   . Peripheral vascular disease   . Dyslipidemia   . Diabetes mellitus 2007    dx'd 2007 w/persisting FBS>125  . GERD (gastroesophageal reflux disease)   . Hiatal hernia   . Diverticular disease   . BPH (benign prostatic hypertrophy)     with hx of acute urinary retention (pt can self-cath)  . COPD (chronic obstructive pulmonary disease)     05/09/08 PFT FEV1 1.84 (55%), FVC 4.42 (95%), TLC 6.46 (92%), DLCO 72%, no BD response  .  Pneumonia 1/09  . Allergic rhinitis   . Shingles     X 2 episodes, both in left scapula area  . OSA (obstructive sleep apnea) 06/14/09    PSG RDI 17, PLMI 96, intolerant of CPAP or BPAP  . Chronic renal insufficiency     Baseline Cr 1.5  . Prostate cancer 07/2008    Low grade; watchful waiting with [name]  . Pulmonary nodule, left 03/02/2010    Left base; resolved on f/u CT  . Allergic rhinitis   . Carpal tunnel syndrome of left wrist 02/11/2011  . IMPLANTATION OF DEFIBRILLATOR, HX OF 09/02/2008    Medtronic 7232Cx Maximo V. Had a 6949 lead - failed and therapies turned off.  . IRON DEFICIENCY 06/08/2010    Hemoccult negative:  GI ([name]) recommended no invasive diagnostics--watchful waiting (2012).  . UNSPECIFIED ANEMIA 06/01/2010    Multifactorial: iron def, CRI, vit B12 def,     Past Surgical History  Procedure Date  . Cardiac defibrillator placement 08/28/2004    [name]  . Colon surgery 1995    partial colectomy  . Coronary artery bypass graft 1996  . Aorta - bilateral femoral artery bypass graft 1999    [name] Early  . Nose surgery   . Insertion of pacing lead     New rate sensing pacing  lead with removal of a previous implanted ICD and insertion of a device back in the pocket with defibrillation threshold testing  . Abdominal aortic aneurysm repair 1999    Ingrarenal repair/ graft  . Stents     Placed to the saphenous vein graft  . Radiofrequency ablation 2006    A flutter    Current Outpatient Prescriptions  Medication Sig Dispense Refill  . acetaminophen (TYLENOL) 325 MG tablet Take 325 mg by mouth every 6 (six) hours as needed. For pain      . albuterol (PROVENTIL) (2.5 MG/3ML) 0.083% nebulizer solution Take 2.5 mg by nebulization every 6 (six) hours as needed. For shortness of breath      . amiodarone (PACERONE) 200 MG tablet Take 200 mg by mouth daily.       Marland Kitchen aspirin 81 MG tablet Take 81 mg by mouth daily.        . budesonide (PULMICORT)  0.25 MG/2ML nebulizer solution Take 2 mLs (0.25 mg total) by nebulization 2 (two) times daily.  60 mL  5  . budesonide (RHINOCORT AQUA) 32 MCG/ACT nasal spray Place 2 sprays into the nose daily.  1 Bottle  12  . carvedilol (COREG) 3.125 MG tablet Take 1 tablet (3.125 mg total) by mouth 2 (two) times daily.  60 tablet  3  . cetirizine (ZYRTEC) 10 MG tablet Take 10 mg by mouth daily.        . clotrimazole-betamethasone (LOTRISONE) cream Apply 1 application topically 2 (two) times daily as needed. For skin irritation      . cyanocobalamin 1000 MCG/ML injection Inject into the muscle every 30 (thirty) days.        Marland Kitchen desoximetasone (TOPICORT) 0.25 % cream Apply 1 application topically 2 (two) times daily as needed. For rash      . finasteride (PROSCAR) 5 MG tablet Take 5 mg by mouth daily.        . furosemide (LASIX) 40 MG tablet Take 1 tablet (40 mg total) by mouth daily.  30 tablet  3  . guaiFENesin (MUCINEX) 600 MG 12 hr tablet Take 1,200 mg by mouth 2 (two) times daily as needed. For congestion      . guaiFENesin-dextromethorphan (ROBITUSSIN DM) 100-10 MG/5ML syrup Take 5 mLs by mouth 3 (three) times daily as needed. As needed for cough      . ipratropium (ATROVENT) 0.03 % nasal spray Two sprays three times a day as needed before meals      . isosorbide mononitrate (IMDUR) 120 MG 24 hr tablet Take 1 tablet (120 mg total) by mouth every morning.  30 tablet  0  . KLOR-CON 10 10 MEQ tablet TAKE 1 TABLET BY MOUTH EVERY DAY  30 tablet  6  . levalbuterol (XOPENEX HFA) 45 MCG/ACT inhaler Inhale 1-2 puffs into the lungs every 4 (four) hours as needed. For shortness of breath  1 Inhaler  6  . levothyroxine (SYNTHROID, LEVOTHROID) 125 MCG tablet Take 125 mcg by mouth daily. Take a whole tablet Monday, Wednesday, Fridays. Take a a 1/2 tablet on Sundays, Tuesdays, Thursdays, and Saturdays.      Marland Kitchen lithium carbonate (LITHOBID) 300 MG CR tablet Take 300 mg by mouth 2 (two) times daily.      . nitroGLYCERIN  (NITROSTAT) 0.3 MG SL tablet Place 0.3 mg under the tongue every 5 (five) minutes as needed. For chest pain      . NON FORMULARY 2 Act daily. Flutter Valve.       Marland Kitchen  NON FORMULARY as directed. Home nebulizer.       Marland Kitchen olmesartan (BENICAR) 5 MG tablet Take 1 tablet (5 mg total) by mouth daily.  30 tablet  5  . omeprazole (PRILOSEC OTC) 20 MG tablet Take 1 tablet (20 mg total) by mouth 2 (two) times daily.  60 tablet  0  . sitaGLIPtin (JANUVIA) 100 MG tablet Take 1 tablet (100 mg total) by mouth daily.  30 tablet  5  . Tamsulosin HCl (FLOMAX) 0.4 MG CAPS Take 0.4 mg by mouth daily.        Marland Kitchen tiotropium (SPIRIVA) 18 MCG inhalation capsule Place 1 capsule (18 mcg total) into inhaler and inhale daily.  30 capsule  5  . warfarin (COUMADIN) 5 MG tablet Take 2.5-5 mg by mouth daily. Take 1/2 TAB DAILY except on Friday then take 1 tab      . omeprazole (PRILOSEC) 20 MG capsule Take 20 mg by mouth 2 (two) times daily.      Marland Kitchen DISCONTD: sitaGLIPtin (JANUVIA) 100 MG tablet Take 1 tablet (100 mg total) by mouth daily.  30 tablet  3    Allergies as of 02/17/2012 - Review Complete 02/17/2012  Allergen Reaction Noted  . Atorvastatin    . Crestor (rosuvastatin calcium) Other (See Comments) 08/30/2010  . Lisinopril Diarrhea 08/30/2010  . Morphine    . Prednisone  09/05/2008  . Zolpidem tartrate      Family History  Problem Relation Age of Onset  . Stroke Mother 52  . Heart attack Mother     CVA  . Cancer Father 85    Lung  . Heart attack Father 58  . Lung cancer Father   . Coronary artery disease Other     2 of 5 siblings with CAD    History   Social History  . Marital Status: Married    Spouse Name: N/A    Number of Children: N/A  . Years of Education: N/A   Occupational History  . RETIRED     Mechanic   Social History Main Topics  . Smoking status: Former Smoker -- 1.0 packs/day for 30 years    Types: Cigarettes    Quit date: 05/23/2003  . Smokeless tobacco: Former Neurosurgeon    Types:  Chew    Quit date: 05/14/2003   Comment: 80 pack a year history  . Alcohol Use: 0.6 oz/week    1 Cans of beer per week     only occ  . Drug Use: No  . Sexually Active: Not on file   Other Topics Concern  . Not on file   Social History Narrative   Lives in [place] with his wife.Very involved in his church.Enjoys bluegrass music, going to [employer], spending time with extended family.Used to play golf twice a week up until 2012.No alc/drug use.Former smoker, quit 2005.       Physical Exam: BP 120/60  Pulse 56  Ht 5\' 10"  (1.778 m)  Wt 168 lb (76.204 kg)  BMI 24.11 kg/m2 Constitutional: generally well-appearing Psychiatric: alert and oriented x3 Eyes: extraocular movements intact Mouth: oral pharynx moist, no lesions Neck: supple no lymphadenopathy Cardiovascular: heart regular rate and rhythm Lungs: clear to auscultation bilaterally Abdomen: soft, nontender, nondistended, no obvious ascites, no peritoneal signs, normal bowel sounds Extremities: no lower extremity edema bilaterally Skin: no lesions on visible extremities    Assessment and plan: 76 y.o. male with  intermittent swallowing difficulty, voice changes   He has an ejection fraction of  around 30%, he is on Coumadin for atrial fibrillation, he has multiple vascular comorbidities, he may have also had a non-STEMI last month. I think there are many reasons here to try to avoid invasive, endoscopic testing. He and his wife were frankly not really sure why they were here to see a gastroenterologist today. He does have intermittent choking with liquids only. He had a modified barium swallow test while in the hospital that suggested he has no risk for aspiration and that he might have an esophageal source of his dysphagia. I'm going to set him up with barium esophagram and we will take it from there.

## 2012-02-20 ENCOUNTER — Ambulatory Visit (HOSPITAL_COMMUNITY)
Admission: RE | Admit: 2012-02-20 | Discharge: 2012-02-20 | Disposition: A | Discharge status: Home / Self Care | Payer: Medicare Other | Source: Ambulatory Visit | Attending: Gastroenterology | Admitting: Gastroenterology

## 2012-02-20 DIAGNOSIS — K219 Gastro-esophageal reflux disease without esophagitis: Secondary | ICD-10-CM | POA: Insufficient documentation

## 2012-02-20 DIAGNOSIS — K449 Diaphragmatic hernia without obstruction or gangrene: Secondary | ICD-10-CM | POA: Insufficient documentation

## 2012-02-20 DIAGNOSIS — K224 Dyskinesia of esophagus: Secondary | ICD-10-CM | POA: Insufficient documentation

## 2012-02-20 DIAGNOSIS — R1013 Epigastric pain: Secondary | ICD-10-CM | POA: Insufficient documentation

## 2012-02-20 DIAGNOSIS — K3189 Other diseases of stomach and duodenum: Secondary | ICD-10-CM | POA: Insufficient documentation

## 2012-02-20 DIAGNOSIS — R1310 Dysphagia, unspecified: Secondary | ICD-10-CM | POA: Insufficient documentation

## 2012-02-27 ENCOUNTER — Encounter: Payer: Self-pay | Admitting: Pulmonary Disease

## 2012-02-27 ENCOUNTER — Ambulatory Visit: Payer: Medicare Other | Admitting: Pulmonary Disease

## 2012-02-27 ENCOUNTER — Ambulatory Visit (INDEPENDENT_AMBULATORY_CARE_PROVIDER_SITE_OTHER): Payer: Medicare Other | Admitting: Pulmonary Disease

## 2012-02-27 VITALS — BP 92/50 | HR 60 | Temp 97.7°F | Ht 71.0 in | Wt 168.6 lb

## 2012-02-27 DIAGNOSIS — J310 Chronic rhinitis: Secondary | ICD-10-CM

## 2012-02-27 DIAGNOSIS — J449 Chronic obstructive pulmonary disease, unspecified: Secondary | ICD-10-CM

## 2012-02-27 DIAGNOSIS — R490 Dysphonia: Secondary | ICD-10-CM

## 2012-02-27 DIAGNOSIS — J4489 Other specified chronic obstructive pulmonary disease: Secondary | ICD-10-CM

## 2012-02-27 NOTE — Progress Notes (Signed)
Chief Complaint  Patient presents with  . Follow-up    pt states breathing has been "fair" since last ov still having SOB with exertion. no complaints today.    History of Present Illness: Roberto [name] is a 76 y.o. male with COPD, and rhinitis.  He has been doing okay.  He still gets hoarse at times.  He is not bringing up much sputum.  He had an esophagram done earlier this month.  This showed moderate esophageal dysmotility and spasm with GERD and small hiatal hernia.  He is due to f/u with [name] with GI.   He was seen at [facility] for his aneurysm, but opted against interventions due to risk of complications.  He uses spiriva daily, and pulmicort with xopenex twice per day.    Tests: PFT 05/09/08 >>FEV1 1.84 (55%), FVC 4.42 (95%), TLC 6.46 (92%), DLCO 72%, no BD response Spirometry 08/28/11>>FEV1 1.51 (43%), FEV1% 48 CT chest 10/01/11>>6.5 cm descending TAA Echo 11/06/11>>EF 30 to 35%, mod LA dilation, PAS 42 mm Hg  Past Medical History  Diagnosis Date  . CAD (coronary artery disease)     post CABG with prior percutaneous intervention of the saphenous vein graft with known saphenous vein graft disease.  Marland Kitchen AAA (abdominal aortic aneurysm)     a. CTA 09/2011: 4.1-4.2cm infrarenal AAA.  Marland Kitchen Aneurysm of thoracic aorta 1995    a. Rupture 1995 w/spontaneous resolution;  b. CTA 09/2011: 6.8cm dist, 5.7cm mid  . Popliteal artery aneurysm   . Myocardial infarction   . Ischemic cardiomyopathy   . Paroxysmal atrial fibrillation   . Hypertension   . Peripheral vascular disease   . Dyslipidemia   . Diabetes mellitus 2007    dx'd 2007 w/persisting FBS>125  . GERD (gastroesophageal reflux disease)   . Hiatal hernia   . Diverticular disease   . BPH (benign prostatic hypertrophy)     with hx of acute urinary retention (pt can self-cath)  . COPD (chronic obstructive pulmonary disease)     05/09/08 PFT FEV1 1.84 (55%), FVC 4.42 (95%), TLC 6.46 (92%), DLCO 72%, no BD response  . Pneumonia  1/09  . Allergic rhinitis   . Shingles     X 2 episodes, both in left scapula area  . OSA (obstructive sleep apnea) 06/14/09    PSG RDI 17, PLMI 96, intolerant of CPAP or BPAP  . Chronic renal insufficiency     Baseline Cr 1.5  . Prostate cancer 07/2008    Low grade; watchful waiting with [name]  . Pulmonary nodule, left 03/02/2010    Left base; resolved on f/u CT  . Allergic rhinitis   . Carpal tunnel syndrome of left wrist 02/11/2011  . IMPLANTATION OF DEFIBRILLATOR, HX OF 09/02/2008    Medtronic 7232Cx Maximo V. Had a 6949 lead - failed and therapies turned off.  . IRON DEFICIENCY 06/08/2010    Hemoccult negative:  GI ([name]) recommended no invasive diagnostics--watchful waiting (2012).  . UNSPECIFIED ANEMIA 06/01/2010    Multifactorial: iron def, CRI, vit B12 def,     Past Surgical History  Procedure Date  . Cardiac defibrillator placement 08/28/2004    [name]  . Colon surgery 1995    partial colectomy  . Coronary artery bypass graft 1996  . Aorta - bilateral femoral artery bypass graft 1999    [name] Early  . Nose surgery   . Insertion of pacing lead     New rate sensing pacing lead with removal  of a previous implanted ICD and insertion of a device back in the pocket with defibrillation threshold testing  . Abdominal aortic aneurysm repair 1999    Ingrarenal repair/ graft  . Stents     Placed to the saphenous vein graft  . Radiofrequency ablation 2006    A flutter    Allergies  Allergen Reactions  . Atorvastatin     REACTION: muscle ache in legs  . Crestor (Rosuvastatin Calcium) Other (See Comments)    Lower ext fatigue and soreness- if taken everyday  . Lisinopril Diarrhea  . Morphine     REACTION: AMS - agitation  . Prednisone     REACTION: Can't breath  . Zolpidem Tartrate     REACTION: AMS    Physical Exam:  Filed Vitals:   02/27/12 1547  BP: 92/50  Pulse: 60  Temp: 97.7 F (36.5 C)  TempSrc: Oral  Height: 5\' 11"  (1.803 m)   Weight: 168 lb 9.6 oz (76.476 kg)  SpO2: 94%    Body mass index is 23.51 kg/(m^2).  Wt Readings from Last 2 Encounters:  02/27/12 168 lb 9.6 oz (76.476 kg)  02/17/12 168 lb (76.204 kg)    General - thin, no distress  HEENT - No sinus tenderness, clear nasal drainage, no oral exudate, no LAN  Cardiac - s1s2 no murmur  Chest - diminished breath sounds, no wheeze/rales  Abd - soft, nontender  Ext - no edema  Neuro - normal strength Psych - normal behavior/mood  Skin - multiple ecchysmoses  Assessment/Plan:  Outpatient Encounter Prescriptions as of 02/27/2012  Medication Sig Dispense Refill  . acetaminophen (TYLENOL) 325 MG tablet Take 325 mg by mouth every 6 (six) hours as needed. For pain      . albuterol (PROVENTIL) (2.5 MG/3ML) 0.083% nebulizer solution Take 2.5 mg by nebulization every 6 (six) hours as needed. For shortness of breath      . amiodarone (PACERONE) 200 MG tablet Take 200 mg by mouth daily.       Marland Kitchen aspirin 81 MG tablet Take 81 mg by mouth daily.        . budesonide (PULMICORT) 0.25 MG/2ML nebulizer solution Take 2 mLs (0.25 mg total) by nebulization 2 (two) times daily.  60 mL  5  . budesonide (RHINOCORT AQUA) 32 MCG/ACT nasal spray Place 2 sprays into the nose daily.  1 Bottle  12  . carvedilol (COREG) 3.125 MG tablet Take 1 tablet (3.125 mg total) by mouth 2 (two) times daily.  60 tablet  3  . cetirizine (ZYRTEC) 10 MG tablet Take 10 mg by mouth daily.        . clotrimazole-betamethasone (LOTRISONE) cream Apply 1 application topically 2 (two) times daily as needed. For skin irritation      . cyanocobalamin 1000 MCG/ML injection Inject into the muscle every 30 (thirty) days.        Marland Kitchen desoximetasone (TOPICORT) 0.25 % cream Apply 1 application topically 2 (two) times daily as needed. For rash      . finasteride (PROSCAR) 5 MG tablet Take 5 mg by mouth daily.        . furosemide (LASIX) 40 MG tablet Take 1 tablet (40 mg total) by mouth daily.  30 tablet  3  .  guaiFENesin (MUCINEX) 600 MG 12 hr tablet Take 1,200 mg by mouth 2 (two) times daily as needed. For congestion      . guaiFENesin-dextromethorphan (ROBITUSSIN DM) 100-10 MG/5ML syrup Take 5 mLs by mouth 3 (three)  times daily as needed. As needed for cough      . ipratropium (ATROVENT) 0.03 % nasal spray Two sprays three times a day as needed before meals      . isosorbide mononitrate (IMDUR) 120 MG 24 hr tablet Take 1 tablet (120 mg total) by mouth every morning.  30 tablet  0  . KLOR-CON 10 10 MEQ tablet TAKE 1 TABLET BY MOUTH EVERY DAY  30 tablet  6  . levalbuterol (XOPENEX HFA) 45 MCG/ACT inhaler Inhale 1-2 puffs into the lungs every 4 (four) hours as needed. For shortness of breath  1 Inhaler  6  . levothyroxine (SYNTHROID, LEVOTHROID) 125 MCG tablet Take 125 mcg by mouth daily. Take a whole tablet Monday, Wednesday, Fridays. Take a a 1/2 tablet on Sundays, Tuesdays, Thursdays, and Saturdays.      Marland Kitchen lithium carbonate (LITHOBID) 300 MG CR tablet Take 300 mg by mouth 2 (two) times daily.      . nitroGLYCERIN (NITROSTAT) 0.3 MG SL tablet Place 0.3 mg under the tongue every 5 (five) minutes as needed. For chest pain      . NON FORMULARY 2 Act daily. Flutter Valve.       . NON FORMULARY as directed. Home nebulizer.       Marland Kitchen olmesartan (BENICAR) 5 MG tablet Take 1 tablet (5 mg total) by mouth daily.  30 tablet  5  . omeprazole (PRILOSEC OTC) 20 MG tablet Take 1 tablet (20 mg total) by mouth 2 (two) times daily.  60 tablet  0  . sitaGLIPtin (JANUVIA) 100 MG tablet Take 1 tablet (100 mg total) by mouth daily.  30 tablet  5  . Tamsulosin HCl (FLOMAX) 0.4 MG CAPS Take 0.4 mg by mouth daily.        Marland Kitchen tiotropium (SPIRIVA) 18 MCG inhalation capsule Place 1 capsule (18 mcg total) into inhaler and inhale daily.  30 capsule  5  . warfarin (COUMADIN) 5 MG tablet Take 2.5-5 mg by mouth daily. Take 1/2 TAB DAILY except on Friday then take 1 tab      . DISCONTD: omeprazole (PRILOSEC) 20 MG capsule Take 20 mg by  mouth 2 (two) times daily.        Ruqaya Strauss Pager:  [phone] 02/27/2012, 4:02 PM

## 2012-02-27 NOTE — Patient Instructions (Signed)
Follow up in 6 months 

## 2012-02-28 ENCOUNTER — Encounter: Payer: Self-pay | Admitting: Family Medicine

## 2012-02-28 ENCOUNTER — Ambulatory Visit (INDEPENDENT_AMBULATORY_CARE_PROVIDER_SITE_OTHER): Payer: Medicare Other | Admitting: Family Medicine

## 2012-02-28 VITALS — BP 111/69 | HR 58 | Ht 70.5 in | Wt 171.0 lb

## 2012-02-28 DIAGNOSIS — R259 Unspecified abnormal involuntary movements: Secondary | ICD-10-CM

## 2012-02-28 DIAGNOSIS — R5381 Other malaise: Secondary | ICD-10-CM

## 2012-02-28 DIAGNOSIS — R42 Dizziness and giddiness: Secondary | ICD-10-CM

## 2012-02-28 DIAGNOSIS — R279 Unspecified lack of coordination: Secondary | ICD-10-CM

## 2012-02-28 DIAGNOSIS — R251 Tremor, unspecified: Secondary | ICD-10-CM

## 2012-02-28 DIAGNOSIS — R5383 Other fatigue: Secondary | ICD-10-CM

## 2012-02-28 DIAGNOSIS — R270 Ataxia, unspecified: Secondary | ICD-10-CM

## 2012-02-28 NOTE — Progress Notes (Signed)
OFFICE NOTE  02/28/2012  CC:  Chief Complaint  Patient presents with  . Follow-up     HPI: Patient is a 76 y.o. Caucasian male who is here for 1 mo f/u fatigue/malaise.  This has been an ongoing problem for him--no acute changes.  He has good days and bad days.  No chest pain, no cough/SOB lately, no HA's.  Says he walks like he's veering off to the side frequently--again, not a new complaint.  No vertigo but frequently a bit "dizzy" esp with going from sitting to standing.  Saw GI as part of post-hospitalization f/u for his recent chest pain episode.  Swallowing study in hospital showed no risk of aspiration.  GI saw him and felt like EGD was not an option given his age/comorbidities, but they scheduled a barium esophagram.    Saw pulmonologist recently: no changes made.  Pertinent PMH:  Past Medical History  Diagnosis Date  . CAD (coronary artery disease)     post CABG with prior percutaneous intervention of the saphenous vein graft with known saphenous vein graft disease.  Marland Kitchen AAA (abdominal aortic aneurysm)     a. CTA 09/2011: 4.1-4.2cm infrarenal AAA.  Marland Kitchen Aneurysm of thoracic aorta 1995    a. Rupture 1995 w/spontaneous resolution;  b. CTA 09/2011: 6.8cm dist, 5.7cm mid  . Popliteal artery aneurysm   . Myocardial infarction   . Ischemic cardiomyopathy   . Paroxysmal atrial fibrillation   . Hypertension   . Peripheral vascular disease   . Dyslipidemia   . Diabetes mellitus 2007    dx'd 2007 w/persisting FBS>125  . GERD (gastroesophageal reflux disease)   . Hiatal hernia   . Diverticular disease   . BPH (benign prostatic hypertrophy)     with hx of acute urinary retention (pt can self-cath)  . COPD (chronic obstructive pulmonary disease)     05/09/08 PFT FEV1 1.84 (55%), FVC 4.42 (95%), TLC 6.46 (92%), DLCO 72%, no BD response  . Pneumonia 1/09  . Allergic rhinitis   . Shingles     X 2 episodes, both in left scapula area  . OSA (obstructive sleep apnea) 06/14/09    PSG RDI  17, PLMI 96, intolerant of CPAP or BPAP  . Chronic renal insufficiency     Baseline Cr 1.5  . Prostate cancer 07/2008    Low grade; watchful waiting with [name]  . Pulmonary nodule, left 03/02/2010    Left base; resolved on f/u CT  . Allergic rhinitis   . Carpal tunnel syndrome of left wrist 02/11/2011  . IMPLANTATION OF DEFIBRILLATOR, HX OF 09/02/2008    Medtronic 7232Cx Maximo V. Had a 6949 lead - failed and therapies turned off.  . IRON DEFICIENCY 06/08/2010    Hemoccult negative:  GI ([name]) recommended no invasive diagnostics--watchful waiting (2012).  . UNSPECIFIED ANEMIA 06/01/2010    Multifactorial: iron def, CRI, vit B12 def,     MEDS:  Outpatient Prescriptions Prior to Visit  Medication Sig Dispense Refill  . acetaminophen (TYLENOL) 325 MG tablet Take 325 mg by mouth every 6 (six) hours as needed. For pain      . albuterol (PROVENTIL) (2.5 MG/3ML) 0.083% nebulizer solution Take 2.5 mg by nebulization every 6 (six) hours as needed. For shortness of breath      . amiodarone (PACERONE) 200 MG tablet Take 200 mg by mouth daily.       Marland Kitchen aspirin 81 MG tablet Take 81 mg by mouth daily.        Marland Kitchen  budesonide (PULMICORT) 0.25 MG/2ML nebulizer solution Take 2 mLs (0.25 mg total) by nebulization 2 (two) times daily.  60 mL  5  . budesonide (RHINOCORT AQUA) 32 MCG/ACT nasal spray Place 2 sprays into the nose daily.  1 Bottle  12  . carvedilol (COREG) 3.125 MG tablet Take 1 tablet (3.125 mg total) by mouth 2 (two) times daily.  60 tablet  3  . cetirizine (ZYRTEC) 10 MG tablet Take 10 mg by mouth daily.        . clotrimazole-betamethasone (LOTRISONE) cream Apply 1 application topically 2 (two) times daily as needed. For skin irritation      . cyanocobalamin 1000 MCG/ML injection Inject into the muscle every 30 (thirty) days.        Marland Kitchen desoximetasone (TOPICORT) 0.25 % cream Apply 1 application topically 2 (two) times daily as needed. For rash      . finasteride (PROSCAR) 5 MG tablet  Take 5 mg by mouth daily.        . furosemide (LASIX) 40 MG tablet Take 1 tablet (40 mg total) by mouth daily.  30 tablet  3  . guaiFENesin (MUCINEX) 600 MG 12 hr tablet Take 1,200 mg by mouth 2 (two) times daily as needed. For congestion      . guaiFENesin-dextromethorphan (ROBITUSSIN DM) 100-10 MG/5ML syrup Take 5 mLs by mouth 3 (three) times daily as needed. As needed for cough      . ipratropium (ATROVENT) 0.03 % nasal spray Two sprays three times a day as needed before meals      . isosorbide mononitrate (IMDUR) 120 MG 24 hr tablet Take 1 tablet (120 mg total) by mouth every morning.  30 tablet  0  . KLOR-CON 10 10 MEQ tablet TAKE 1 TABLET BY MOUTH EVERY DAY  30 tablet  6  . levalbuterol (XOPENEX HFA) 45 MCG/ACT inhaler Inhale 1-2 puffs into the lungs every 4 (four) hours as needed. For shortness of breath  1 Inhaler  6  . levothyroxine (SYNTHROID, LEVOTHROID) 125 MCG tablet Take 125 mcg by mouth daily. Take a whole tablet Monday, Wednesday, Fridays. Take a a 1/2 tablet on Sundays, Tuesdays, Thursdays, and Saturdays.      Marland Kitchen lithium carbonate (LITHOBID) 300 MG CR tablet Take 300 mg by mouth 2 (two) times daily.      . nitroGLYCERIN (NITROSTAT) 0.3 MG SL tablet Place 0.3 mg under the tongue every 5 (five) minutes as needed. For chest pain      . NON FORMULARY 2 Act daily. Flutter Valve.       . NON FORMULARY as directed. Home nebulizer.       Marland Kitchen olmesartan (BENICAR) 5 MG tablet Take 1 tablet (5 mg total) by mouth daily.  30 tablet  5  . omeprazole (PRILOSEC OTC) 20 MG tablet Take 1 tablet (20 mg total) by mouth 2 (two) times daily.  60 tablet  0  . sitaGLIPtin (JANUVIA) 100 MG tablet Take 1 tablet (100 mg total) by mouth daily.  30 tablet  5  . Tamsulosin HCl (FLOMAX) 0.4 MG CAPS Take 0.4 mg by mouth daily.        Marland Kitchen tiotropium (SPIRIVA) 18 MCG inhalation capsule Place 1 capsule (18 mcg total) into inhaler and inhale daily.  30 capsule  5  . warfarin (COUMADIN) 5 MG tablet Take 2.5-5 mg by mouth  daily. Take 1/2 TAB DAILY except on Friday then take 1 tab        PE: Blood pressure 111/69,  pulse 58, height 5' 10.5" (1.791 m), weight 171 lb (77.565 kg), SpO2 95.00%. Gen: Alert, well appearing.  Patient is oriented to person, place, time, and situation. CV: RRR LUNGS: CTA bilat, nonlabored, good aeration. Neuro: CN 2-12 intact bilaterally, strength 5/5 in proximal and distal upper extremities and lower extremities bilaterally.  No sensory deficits. Mild resting and intention tremor.   He walks without a broad based gait, but does periodically veer to one side sometime and he catches his balance pretty quick.  No rigidity, no bradykinesia.  Upper extremity and lower extremity DTRs symmetric.  No pronator drift.   IMPRESSION AND PLAN:  Malaise and fatigue Most likely his sx's are the result of the combination of aging, polypharmacy, and significant chronic cardiovascular and pulmonary disease. It is hard to make a case at this point for neuroimaging based on the mild tremor, ataxia, and dizziness that he has--primarily b/c this has all been insidious in its onset and course.  However, I did mention this possibility to he and his wife today as a possible future avenue of investigation.  He would be unable to get an MRI due to having an ICD. At this time, I have made no changes in his medical regimen and have not ordered any imaging.  I'll ask radiologist if brain CT with contrast would be helpful or not, although even this would have to be done with low dose contrast and pt would have to understand the risk of possible contrast induced ARF.   An After Visit Summary was printed and given to the patient.   FOLLOW UP: 34mo

## 2012-03-01 NOTE — Assessment & Plan Note (Signed)
Continue prn atrovent nasal spray.     

## 2012-03-01 NOTE — Assessment & Plan Note (Signed)
Stable on current regimen.  I have given him samples of spiriva.

## 2012-03-01 NOTE — Assessment & Plan Note (Signed)
He had esophagram earlier this month.  He is to f/u with GI to discuss results in more detail.

## 2012-03-03 DIAGNOSIS — R5381 Other malaise: Secondary | ICD-10-CM | POA: Insufficient documentation

## 2012-03-03 NOTE — Assessment & Plan Note (Signed)
Most likely his sx's are the result of the combination of aging, polypharmacy, and significant chronic cardiovascular and pulmonary disease. It is hard to make a case at this point for neuroimaging based on the mild tremor, ataxia, and dizziness that he has--primarily b/c this has all been insidious in its onset and course.  However, I did mention this possibility to he and his wife today as a possible future avenue of investigation.  He would be unable to get an MRI due to having an ICD. At this time, I have made no changes in his medical regimen and have not ordered any imaging.  I'll ask radiologist if brain CT with contrast would be helpful or not, although even this would have to be done with low dose contrast and pt would have to understand the risk of possible contrast induced ARF.

## 2012-03-09 ENCOUNTER — Ambulatory Visit (INDEPENDENT_AMBULATORY_CARE_PROVIDER_SITE_OTHER): Payer: Medicare Other | Admitting: *Deleted

## 2012-03-09 DIAGNOSIS — Z7901 Long term (current) use of anticoagulants: Secondary | ICD-10-CM

## 2012-03-09 DIAGNOSIS — I4891 Unspecified atrial fibrillation: Secondary | ICD-10-CM

## 2012-03-09 LAB — POCT INR: INR: 2.6

## 2012-03-10 ENCOUNTER — Other Ambulatory Visit: Payer: Self-pay | Admitting: Family Medicine

## 2012-03-11 ENCOUNTER — Telehealth: Payer: Self-pay | Admitting: Cardiology

## 2012-03-11 NOTE — Telephone Encounter (Signed)
[facility] needs new rx for pt's isosorbide pt states dr Riley Kill increased it and they need it called in with correct dosage

## 2012-03-12 MED ORDER — ISOSORBIDE MONONITRATE ER 120 MG PO TB24: 120.0000 mg | ORAL_TABLET | ORAL | Status: DC

## 2012-03-12 NOTE — Telephone Encounter (Signed)
rx sent into pharmacy, pt notified. 

## 2012-03-16 ENCOUNTER — Ambulatory Visit: Payer: Medicare Other | Admitting: Cardiology

## 2012-03-16 ENCOUNTER — Other Ambulatory Visit: Payer: Self-pay | Admitting: Family Medicine

## 2012-03-16 ENCOUNTER — Ambulatory Visit (INDEPENDENT_AMBULATORY_CARE_PROVIDER_SITE_OTHER): Payer: Medicare Other | Admitting: Cardiology

## 2012-03-16 ENCOUNTER — Other Ambulatory Visit: Payer: Self-pay | Admitting: Pulmonary Disease

## 2012-03-16 VITALS — BP 122/74 | HR 54 | Wt 169.0 lb

## 2012-03-16 DIAGNOSIS — I712 Thoracic aortic aneurysm, without rupture, unspecified: Secondary | ICD-10-CM

## 2012-03-16 DIAGNOSIS — I4891 Unspecified atrial fibrillation: Secondary | ICD-10-CM

## 2012-03-16 DIAGNOSIS — I2510 Atherosclerotic heart disease of native coronary artery without angina pectoris: Secondary | ICD-10-CM

## 2012-03-16 DIAGNOSIS — I25810 Atherosclerosis of coronary artery bypass graft(s) without angina pectoris: Secondary | ICD-10-CM

## 2012-03-16 NOTE — Patient Instructions (Addendum)
Your physician recommends that you schedule a follow-up appointment in: 2 MONTHS  Your physician recommends that you continue on your current medications as directed. Please refer to the Current Medication list given to you today.  Your physician recommends that you have lab work today: CBC, BMP, LIVER, TSH

## 2012-03-16 NOTE — Progress Notes (Signed)
HPI:  The patient is in for followup. He was recently in the hospital. He says is currently got sent to the GI service for some swallowing difficulties, and a swallowing study was compatible with esophageal dysmotility. He is seen [name] and has followup planned with him he's not had any further chest pain  Current Outpatient Prescriptions  Medication Sig Dispense Refill  . acetaminophen (TYLENOL) 325 MG tablet Take 325 mg by mouth every 6 (six) hours as needed. For pain      . albuterol (PROVENTIL) (2.5 MG/3ML) 0.083% nebulizer solution Take 2.5 mg by nebulization every 6 (six) hours as needed. For shortness of breath      . amiodarone (PACERONE) 200 MG tablet Take 200 mg by mouth daily.       Marland Kitchen aspirin 81 MG tablet Take 81 mg by mouth daily.        . budesonide (PULMICORT) 0.25 MG/2ML nebulizer solution Take 2 mLs (0.25 mg total) by nebulization 2 (two) times daily.  60 mL  5  . budesonide (RHINOCORT AQUA) 32 MCG/ACT nasal spray Place 2 sprays into the nose daily.  1 Bottle  12  . carvedilol (COREG) 3.125 MG tablet Take 1 tablet (3.125 mg total) by mouth 2 (two) times daily.  60 tablet  3  . cetirizine (ZYRTEC) 10 MG tablet Take 10 mg by mouth daily.        . clotrimazole-betamethasone (LOTRISONE) cream Apply 1 application topically 2 (two) times daily as needed. For skin irritation      . cyanocobalamin 1000 MCG/ML injection Inject into the muscle every 30 (thirty) days.        Marland Kitchen desoximetasone (TOPICORT) 0.25 % cream Apply 1 application topically 2 (two) times daily as needed. For rash      . finasteride (PROSCAR) 5 MG tablet Take 5 mg by mouth daily.        . furosemide (LASIX) 40 MG tablet TAKE 1 TABLET BY MOUTH EVERY DAY  30 tablet  3  . guaiFENesin (MUCINEX) 600 MG 12 hr tablet Take 1,200 mg by mouth 2 (two) times daily as needed. For congestion      . guaiFENesin-dextromethorphan (ROBITUSSIN DM) 100-10 MG/5ML syrup Take 5 mLs by mouth 3 (three) times daily as needed. As needed for  cough      . ipratropium (ATROVENT) 0.03 % nasal spray Two sprays three times a day as needed before meals      . isosorbide mononitrate (IMDUR) 120 MG 24 hr tablet Take 1 tablet (120 mg total) by mouth every morning.  30 tablet  0  . KLOR-CON 10 10 MEQ tablet TAKE 1 TABLET BY MOUTH EVERY DAY  30 tablet  6  . levalbuterol (XOPENEX HFA) 45 MCG/ACT inhaler Inhale 1-2 puffs into the lungs every 4 (four) hours as needed. For shortness of breath  1 Inhaler  6  . levothyroxine (SYNTHROID, LEVOTHROID) 125 MCG tablet Take a whole tablet Monday, Wednesday, Fridays. Take a a 1/2 tablet on Sundays, Tuesdays, Thursdays, and Saturdays.      Marland Kitchen lithium carbonate (LITHOBID) 300 MG CR tablet Take 300 mg by mouth 2 (two) times daily.      . nitroGLYCERIN (NITROSTAT) 0.3 MG SL tablet Place 0.3 mg under the tongue every 5 (five) minutes as needed. For chest pain      . NON FORMULARY 2 Act daily. Flutter Valve.       . NON FORMULARY as directed. Home nebulizer.       Marland Kitchen  olmesartan (BENICAR) 5 MG tablet Take 1 tablet (5 mg total) by mouth daily.  30 tablet  5  . omeprazole (PRILOSEC OTC) 20 MG tablet Take 1 tablet (20 mg total) by mouth 2 (two) times daily.  60 tablet  0  . sitaGLIPtin (JANUVIA) 100 MG tablet Take 1 tablet (100 mg total) by mouth daily.  30 tablet  5  . SPIRIVA HANDIHALER 18 MCG inhalation capsule PLACE 1 CAPSULE (18 MCG TOTAL) INTO INHALER AND INHALE DAILY.  30 each  5  . Tamsulosin HCl (FLOMAX) 0.4 MG CAPS Take 0.4 mg by mouth daily.        Marland Kitchen warfarin (COUMADIN) 5 MG tablet Take 2.5-5 mg by mouth daily. Take 1/2 TAB DAILY except on Friday then take 1 tab      . [DISCONTINUED] tiotropium (SPIRIVA) 18 MCG inhalation capsule Place 1 capsule (18 mcg total) into inhaler and inhale daily.  30 capsule  5    Allergies  Allergen Reactions  . Atorvastatin     REACTION: muscle ache in legs  . Crestor (Rosuvastatin Calcium) Other (See Comments)    Lower ext fatigue and soreness- if taken everyday  .  Lisinopril Diarrhea  . Morphine     REACTION: AMS - agitation  . Prednisone     REACTION: Can't breath  . Zolpidem Tartrate     REACTION: AMS    Past Medical History  Diagnosis Date  . CAD (coronary artery disease)     post CABG with prior percutaneous intervention of the saphenous vein graft with known saphenous vein graft disease.  Marland Kitchen AAA (abdominal aortic aneurysm)     a. CTA 09/2011: 4.1-4.2cm infrarenal AAA.  Marland Kitchen Aneurysm of thoracic aorta 1995    a. Rupture 1995 w/spontaneous resolution;  b. CTA 09/2011: 6.8cm dist, 5.7cm mid  . Popliteal artery aneurysm   . Myocardial infarction   . Ischemic cardiomyopathy   . Paroxysmal atrial fibrillation   . Hypertension   . Peripheral vascular disease   . Dyslipidemia   . Diabetes mellitus 2007    dx'd 2007 w/persisting FBS>125  . GERD (gastroesophageal reflux disease)   . Hiatal hernia   . Diverticular disease   . BPH (benign prostatic hypertrophy)     with hx of acute urinary retention (pt can self-cath)  . COPD (chronic obstructive pulmonary disease)     05/09/08 PFT FEV1 1.84 (55%), FVC 4.42 (95%), TLC 6.46 (92%), DLCO 72%, no BD response  . Pneumonia 1/09  . Allergic rhinitis   . Shingles     X 2 episodes, both in left scapula area  . OSA (obstructive sleep apnea) 06/14/09    PSG RDI 17, PLMI 96, intolerant of CPAP or BPAP  . Chronic renal insufficiency     Baseline Cr 1.5  . Prostate cancer 07/2008    Low grade; watchful waiting with [name]  . Pulmonary nodule, left 03/02/2010    Left base; resolved on f/u CT  . Allergic rhinitis   . Carpal tunnel syndrome of left wrist 02/11/2011  . IMPLANTATION OF DEFIBRILLATOR, HX OF 09/02/2008    Medtronic 7232Cx Maximo V. Had a 6949 lead - failed and therapies turned off.  . IRON DEFICIENCY 06/08/2010    Hemoccult negative:  GI ([name]) recommended no invasive diagnostics--watchful waiting (2012).  . UNSPECIFIED ANEMIA 06/01/2010    Multifactorial: iron def, CRI, vit B12  def,     Past Surgical History  Procedure Date  . Cardiac defibrillator placement 08/28/2004  [name]  . Colon surgery 1995    partial colectomy  . Coronary artery bypass graft 1996  . Aorta - bilateral femoral artery bypass graft 1999    [name] Early  . Nose surgery   . Insertion of pacing lead     New rate sensing pacing lead with removal of a previous implanted ICD and insertion of a device back in the pocket with defibrillation threshold testing  . Abdominal aortic aneurysm repair 1999    Ingrarenal repair/ graft  . Stents     Placed to the saphenous vein graft  . Radiofrequency ablation 2006    A flutter    Family History  Problem Relation Age of Onset  . Stroke Mother 77  . Heart attack Mother     CVA  . Cancer Father 62    Lung  . Heart attack Father 61  . Lung cancer Father   . Coronary artery disease Other     2 of 5 siblings with CAD    History   Social History  . Marital Status: Married    Spouse Name: N/A    Number of Children: N/A  . Years of Education: N/A   Occupational History  . RETIRED     Mechanic   Social History Main Topics  . Smoking status: Former Smoker -- 1.0 packs/day for 30 years    Types: Cigarettes    Quit date: 05/23/2003  . Smokeless tobacco: Former Neurosurgeon    Types: Chew    Quit date: 05/14/2003     Comment: 80 pack a year history  . Alcohol Use: 0.6 oz/week    1 Cans of beer per week     Comment: only occ  . Drug Use: No  . Sexually Active: Not on file   Other Topics Concern  . Not on file   Social History Narrative   Lives in [place] with his wife.Very involved in his church.Enjoys bluegrass music, going to [employer], spending time with extended family.Used to play golf twice a week up until 2012.No alc/drug use.Former smoker, quit 2005.    ROS: Please see the HPI.  All other systems reviewed and negative.  PHYSICAL EXAM:  BP 122/74  Pulse 54  Wt 169 lb (76.658 kg)  General: Well developed,  well nourished, in no acute distress. Head:  Normocephalic and atraumatic. Neck: no JVD Lungs: Some expiratory wheezes.   Heart: Normal S1 and S2.  S4 gallop Abdomen:  Normal bowel sounds; soft; non tender; no organomegaly Pulses: Pulses normal in all 4 extremities. Extremities: No clubbing or cyanosis. No edema. Neurologic: Alert and oriented x 3.  EKG:  SB.  Left axis deviation.  Nonspecific ST and T changes.    ASSESSMENT AND PLAN:

## 2012-03-17 ENCOUNTER — Telehealth: Payer: Self-pay | Admitting: Cardiology

## 2012-03-17 LAB — CBC WITH DIFFERENTIAL/PLATELET
Basophils Absolute: 0 K/uL (ref 0.0–0.1)
Basophils Relative: 0.6 % (ref 0.0–3.0)
Eosinophils Absolute: 0.3 K/uL (ref 0.0–0.7)
Eosinophils Relative: 4.4 % (ref 0.0–5.0)
HCT: 38.8 % — ABNORMAL LOW (ref 39.0–52.0)
Hemoglobin: 12.4 g/dL — ABNORMAL LOW (ref 13.0–17.0)
Lymphocytes Relative: 18.2 % (ref 12.0–46.0)
Lymphs Abs: 1.3 K/uL (ref 0.7–4.0)
MCHC: 32.1 g/dL (ref 30.0–36.0)
MCV: 92.3 fl (ref 78.0–100.0)
Monocytes Absolute: 0.5 K/uL (ref 0.1–1.0)
Monocytes Relative: 7.2 % (ref 3.0–12.0)
Neutro Abs: 5 K/uL (ref 1.4–7.7)
Neutrophils Relative %: 69.6 % (ref 43.0–77.0)
Platelets: 141 K/uL — ABNORMAL LOW (ref 150.0–400.0)
RBC: 4.21 Mil/uL — ABNORMAL LOW (ref 4.22–5.81)
RDW: 14.6 % (ref 11.5–14.6)
WBC: 7.2 K/uL (ref 4.5–10.5)

## 2012-03-17 LAB — HEPATIC FUNCTION PANEL
ALT: 18 U/L (ref 0–53)
AST: 18 U/L (ref 0–37)
Albumin: 4 g/dL (ref 3.5–5.2)
Alkaline Phosphatase: 59 U/L (ref 39–117)
Bilirubin, Direct: 0.1 mg/dL (ref 0.0–0.3)
Total Bilirubin: 0.6 mg/dL (ref 0.3–1.2)
Total Protein: 6.9 g/dL (ref 6.0–8.3)

## 2012-03-17 LAB — TSH: TSH: 0.53 u[IU]/mL (ref 0.35–5.50)

## 2012-03-17 LAB — BASIC METABOLIC PANEL
CO2: 29 mEq/L (ref 19–32)
Calcium: 9.2 mg/dL (ref 8.4–10.5)
Glucose, Bld: 88 mg/dL (ref 70–99)
Potassium: 4.1 mEq/L (ref 3.5–5.1)
Sodium: 139 mEq/L (ref 135–145)

## 2012-03-17 NOTE — Telephone Encounter (Signed)
Pt has questions regarding medication he is not sure what he needs to take

## 2012-03-17 NOTE — Telephone Encounter (Signed)
I spoke with the pt and he took 120mg  of Isosorbide MN this morning.  The pt reviewed his medication list from our office and we have 120mg  daily on file.  The pt looked through his medication bottles and he has two bottles. One said 60mg  daily the other has 60mg  take two daily.  The pt has only been taking one daily, but today decided to take two. The pt complains of weakness and feels like knees are going to give out. The pt has not checked his BP today.  I made the pt aware that he needs to go back to 60mg  daily and check his BP.  The pt will call back if his BP is low.

## 2012-03-18 ENCOUNTER — Other Ambulatory Visit: Payer: Self-pay | Admitting: Family Medicine

## 2012-03-18 NOTE — Telephone Encounter (Signed)
Electronic refill request for Amlodipine.  This med was dc'd on 9/24 by cardiology.  Refill denied.

## 2012-03-19 ENCOUNTER — Encounter: Payer: Self-pay | Admitting: Cardiology

## 2012-03-19 NOTE — Telephone Encounter (Signed)
Mr Hornecker wants Dr Riley Kill to recommend a cardiologist for his son in law.

## 2012-03-19 NOTE — Telephone Encounter (Signed)
Pt calling lauren re a dr's visit

## 2012-03-23 NOTE — Telephone Encounter (Signed)
Follow-up:    Patient called in about his last call.  Please call back.

## 2012-03-23 NOTE — Telephone Encounter (Signed)
I spoke  This encounter was created in error - please disregard.

## 2012-03-30 ENCOUNTER — Other Ambulatory Visit: Payer: Self-pay | Admitting: Family Medicine

## 2012-03-30 NOTE — Assessment & Plan Note (Signed)
Remains on warfarin. ?

## 2012-03-30 NOTE — Assessment & Plan Note (Signed)
Overall remaining stable at present.  Will not change current treatment.

## 2012-03-30 NOTE — Telephone Encounter (Signed)
eScribe request for refill on LITHIUM Last filled -  Last seen on - 02/28/12 Follow up - 3 MONTHS Please advise refills

## 2012-03-30 NOTE — Assessment & Plan Note (Signed)
Continues with conservative management.  Referral to [facility] was proposed, patient went, and decided against any novel intervention.  Will continue to monitor for symptoms.

## 2012-04-07 ENCOUNTER — Ambulatory Visit (INDEPENDENT_AMBULATORY_CARE_PROVIDER_SITE_OTHER): Payer: Medicare Other | Admitting: *Deleted

## 2012-04-07 ENCOUNTER — Encounter: Payer: Self-pay | Admitting: Gastroenterology

## 2012-04-07 ENCOUNTER — Ambulatory Visit (INDEPENDENT_AMBULATORY_CARE_PROVIDER_SITE_OTHER): Payer: Medicare Other | Admitting: Gastroenterology

## 2012-04-07 VITALS — BP 90/50 | HR 66 | Ht 70.5 in | Wt 165.8 lb

## 2012-04-07 DIAGNOSIS — R1310 Dysphagia, unspecified: Secondary | ICD-10-CM

## 2012-04-07 DIAGNOSIS — I4891 Unspecified atrial fibrillation: Secondary | ICD-10-CM

## 2012-04-07 DIAGNOSIS — Z7901 Long term (current) use of anticoagulants: Secondary | ICD-10-CM

## 2012-04-07 NOTE — Progress Notes (Signed)
Review of pertinent gastrointestinal problems: 1. Dysphagia:  In hospital MBSS 2013 showed no aspiration. Barium esophagram 02/2012 showed dysmotility and "slight GE junction ring, tablet passed through easily".   HPI: This is a  very pleasant 76 year old man whom I last saw about 2 months ago.  Has trouble with water.  No pill associated dysphagia.  No solid food dysphagia.  Liquids cause him to choke.  Very clear that he has absolute no troubles with solids, no troubles with pills. He has no dysphasia. The only problem he has is when he drinks water and this causes him to choke, causes strangling sensation.  Past Medical History  Diagnosis Date  . CAD (coronary artery disease)     post CABG with prior percutaneous intervention of the saphenous vein graft with known saphenous vein graft disease.  Marland Kitchen AAA (abdominal aortic aneurysm)     a. CTA 09/2011: 4.1-4.2cm infrarenal AAA.  Marland Kitchen Aneurysm of thoracic aorta 1995    a. Rupture 1995 w/spontaneous resolution;  b. CTA 09/2011: 6.8cm dist, 5.7cm mid  . Popliteal artery aneurysm   . Myocardial infarction   . Ischemic cardiomyopathy   . Paroxysmal atrial fibrillation   . Hypertension   . Peripheral vascular disease   . Dyslipidemia   . Diabetes mellitus 2007    dx'd 2007 w/persisting FBS>125  . GERD (gastroesophageal reflux disease)   . Hiatal hernia   . Diverticular disease   . BPH (benign prostatic hypertrophy)     with hx of acute urinary retention (pt can self-cath)  . COPD (chronic obstructive pulmonary disease)     05/09/08 PFT FEV1 1.84 (55%), FVC 4.42 (95%), TLC 6.46 (92%), DLCO 72%, no BD response  . Pneumonia 1/09  . Allergic rhinitis   . Shingles     X 2 episodes, both in left scapula area  . OSA (obstructive sleep apnea) 06/14/09    PSG RDI 17, PLMI 96, intolerant of CPAP or BPAP  . Chronic renal insufficiency     Baseline Cr 1.5  . Prostate cancer 07/2008    Low grade; watchful waiting with [name]  . Pulmonary nodule,  left 03/02/2010    Left base; resolved on f/u CT  . Allergic rhinitis   . Carpal tunnel syndrome of left wrist 02/11/2011  . IMPLANTATION OF DEFIBRILLATOR, HX OF 09/02/2008    Medtronic 7232Cx Maximo V. Had a 6949 lead - failed and therapies turned off.  . IRON DEFICIENCY 06/08/2010    Hemoccult negative:  GI ([name]) recommended no invasive diagnostics--watchful waiting (2012).  . UNSPECIFIED ANEMIA 06/01/2010    Multifactorial: iron def, CRI, vit B12 def,     Past Surgical History  Procedure Date  . Cardiac defibrillator placement 08/28/2004    [name]  . Colon surgery 1995    partial colectomy  . Coronary artery bypass graft 1996  . Aorta - bilateral femoral artery bypass graft 1999    [name] Early  . Nose surgery   . Insertion of pacing lead     New rate sensing pacing lead with removal of a previous implanted ICD and insertion of a device back in the pocket with defibrillation threshold testing  . Abdominal aortic aneurysm repair 1999    Ingrarenal repair/ graft  . Stents     Placed to the saphenous vein graft  . Radiofrequency ablation 2006    A flutter    Current Outpatient Prescriptions  Medication Sig Dispense Refill  . acetaminophen (TYLENOL) 325 MG  tablet Take 325 mg by mouth every 6 (six) hours as needed. For pain      . albuterol (PROVENTIL) (2.5 MG/3ML) 0.083% nebulizer solution Take 2.5 mg by nebulization every 6 (six) hours as needed. For shortness of breath      . amiodarone (PACERONE) 200 MG tablet Take 200 mg by mouth daily.       Marland Kitchen aspirin 81 MG tablet Take 81 mg by mouth daily.        . budesonide (PULMICORT) 0.25 MG/2ML nebulizer solution Take 2 mLs (0.25 mg total) by nebulization 2 (two) times daily.  60 mL  5  . budesonide (RHINOCORT AQUA) 32 MCG/ACT nasal spray Place 2 sprays into the nose daily.  1 Bottle  12  . carvedilol (COREG) 3.125 MG tablet Take 1 tablet (3.125 mg total) by mouth 2 (two) times daily.  60 tablet  3  . cetirizine  (ZYRTEC) 10 MG tablet Take 10 mg by mouth daily.        . clotrimazole-betamethasone (LOTRISONE) cream Apply 1 application topically 2 (two) times daily as needed. For skin irritation      . cyanocobalamin 1000 MCG/ML injection Inject into the muscle every 30 (thirty) days.        Marland Kitchen desoximetasone (TOPICORT) 0.25 % cream Apply 1 application topically 2 (two) times daily as needed. For rash      . finasteride (PROSCAR) 5 MG tablet Take 5 mg by mouth daily.        . furosemide (LASIX) 40 MG tablet TAKE 1 TABLET BY MOUTH EVERY DAY  30 tablet  3  . guaiFENesin (MUCINEX) 600 MG 12 hr tablet Take 1,200 mg by mouth 2 (two) times daily as needed. For congestion      . guaiFENesin-dextromethorphan (ROBITUSSIN DM) 100-10 MG/5ML syrup Take 5 mLs by mouth 3 (three) times daily as needed. As needed for cough      . ipratropium (ATROVENT) 0.03 % nasal spray Two sprays three times a day as needed before meals      . isosorbide mononitrate (IMDUR) 60 MG 24 hr tablet Take 1 tablet (60 mg total) by mouth daily.  1 tablet  0  . KLOR-CON 10 10 MEQ tablet TAKE 1 TABLET BY MOUTH EVERY DAY  30 tablet  6  . levalbuterol (XOPENEX HFA) 45 MCG/ACT inhaler Inhale 1-2 puffs into the lungs every 4 (four) hours as needed. For shortness of breath  1 Inhaler  6  . levothyroxine (SYNTHROID, LEVOTHROID) 125 MCG tablet ALTERNATING TAKING 1 TABLET DAILY WITH 1/2 TABLET DAILY.  23 tablet  5  . lithium carbonate (LITHOBID) 300 MG CR tablet TAKE 1 TABLET (300 MG TOTAL) BY MOUTH 2 (TWO) TIMES DAILY.  60 tablet  6  . nitroGLYCERIN (NITROSTAT) 0.3 MG SL tablet Place 0.3 mg under the tongue every 5 (five) minutes as needed. For chest pain      . NON FORMULARY 2 Act daily. Flutter Valve.       . NON FORMULARY as directed. Home nebulizer.       Marland Kitchen olmesartan (BENICAR) 5 MG tablet Take 1 tablet (5 mg total) by mouth daily.  30 tablet  5  . omeprazole (PRILOSEC OTC) 20 MG tablet Take 1 tablet (20 mg total) by mouth 2 (two) times daily.  60 tablet   0  . sitaGLIPtin (JANUVIA) 100 MG tablet Take 1 tablet (100 mg total) by mouth daily.  30 tablet  5  . SPIRIVA HANDIHALER 18 MCG inhalation capsule  PLACE 1 CAPSULE (18 MCG TOTAL) INTO INHALER AND INHALE DAILY.  30 each  5  . Tamsulosin HCl (FLOMAX) 0.4 MG CAPS Take 0.4 mg by mouth daily.        Marland Kitchen warfarin (COUMADIN) 5 MG tablet Take 2.5-5 mg by mouth daily. Take 1/2 TAB DAILY except on Friday then take 1 tab        Allergies as of 04/07/2012 - Review Complete 04/07/2012  Allergen Reaction Noted  . Atorvastatin    . Crestor (rosuvastatin calcium) Other (See Comments) 08/30/2010  . Lisinopril Diarrhea 08/30/2010  . Morphine    . Prednisone  09/05/2008  . Zolpidem tartrate      Family History  Problem Relation Age of Onset  . Stroke Mother 16  . Heart attack Mother     CVA  . Cancer Father 40    Lung  . Heart attack Father 40  . Lung cancer Father   . Coronary artery disease Other     2 of 5 siblings with CAD    History   Social History  . Marital Status: Married    Spouse Name: N/A    Number of Children: N/A  . Years of Education: N/A   Occupational History  . RETIRED     Mechanic   Social History Main Topics  . Smoking status: Former Smoker -- 1.0 packs/day for 30 years    Types: Cigarettes    Quit date: 05/23/2003  . Smokeless tobacco: Former Neurosurgeon    Types: Chew    Quit date: 05/14/2003     Comment: 80 pack a year history  . Alcohol Use: 0.6 oz/week    1 Cans of beer per week     Comment: only occ  . Drug Use: No  . Sexually Active: Not on file   Other Topics Concern  . Not on file   Social History Narrative   Lives in [place] with his wife.Very involved in his church.Enjoys bluegrass music, going to [employer], spending time with extended family.Used to play golf twice a week up until 2012.No alc/drug use.Former smoker, quit 2005.      Physical Exam: BP 90/50  Pulse 66  Ht 5' 10.5" (1.791 m)  Wt 165 lb 12.8 oz (75.206 kg)  BMI 23.45 kg/m2   SpO2 98% Constitutional: generally well-appearing Psychiatric: alert and oriented x3 Abdomen: soft, nontender, nondistended, no obvious ascites, no peritoneal signs, normal bowel sounds     Assessment and plan: 76 y.o. male with liquid associated choking, coughing  He is very clear that he has no difficulty with solids, no difficulty with pills. He has no esophageal dysphasia symptoms. He only has issues when he drinks water or other liquids and these cause him to choke, cough. He had a modified barium swallow evaluation done about 3 or 4 months ago while hospitalized and this showed no aspiration however clinically he describes aspiration fairly clearly. I'm going to have him get a repeat formal modified barium swallow study.

## 2012-04-07 NOTE — Patient Instructions (Addendum)
Chew your food well, eat slowly, take small bites. We will set up speech therapy, Modified Barium Swallow Study for liquid associated coughing, choking. We will send copy of today's note to Dr. Milinda Cave.  You have been scheduled for a modified barium swallow on 04/13/12 at 1 pm . Please arrive 15 minutes prior to your test for registration. You will go to [facility]  Radiology (1st Floor) for your appointment. Please refrain from eating or drinking anything 4 hours prior to your test. Should you need to cancel or reschedule your appointment, please contact  [phone] [facility]). _____________________________________________________________________ A Modified Barium Swallow Study, or MBS, is a special x-ray that is taken to check swallowing skills. It is carried out by a Marine scientist and a Warehouse manager (SLP). During this test, yourmouth, throat, and esophagus, a muscular tube which connects your mouth to your stomach, is checked. The test will help you, your doctor, and the SLP plan what types of foods and liquids are easier for you to swallow. The SLP will also identify positions and ways to help you swallow more easily and safely. What will happen during an MBS? You will be taken to an x-ray room and seated comfortably. You will be asked to swallow small amounts of food and liquid mixed with barium. Barium is a liquid or paste that allows images of your mouth, throat and esophagus to be seen on x-ray. The x-ray captures moving images of the food you are swallowing as it travels from your mouth through your throat and into your esophagus. This test helps identify whether food or liquid is entering your lungs (aspiration). The test also shows which part of your mouth or throat lacks strength or coordination to move the food or liquid in the right direction. This test typically takes 30 minutes to 1 hour to  complete. _______________________________________________________________________

## 2012-04-13 ENCOUNTER — Ambulatory Visit (HOSPITAL_COMMUNITY)
Admission: RE | Admit: 2012-04-13 | Discharge: 2012-04-13 | Disposition: A | Discharge status: Home / Self Care | Payer: Medicare Other | Source: Ambulatory Visit | Attending: Gastroenterology | Admitting: Gastroenterology

## 2012-04-13 DIAGNOSIS — J449 Chronic obstructive pulmonary disease, unspecified: Secondary | ICD-10-CM | POA: Insufficient documentation

## 2012-04-13 DIAGNOSIS — K224 Dyskinesia of esophagus: Secondary | ICD-10-CM | POA: Insufficient documentation

## 2012-04-13 DIAGNOSIS — K219 Gastro-esophageal reflux disease without esophagitis: Secondary | ICD-10-CM | POA: Insufficient documentation

## 2012-04-13 DIAGNOSIS — E119 Type 2 diabetes mellitus without complications: Secondary | ICD-10-CM | POA: Insufficient documentation

## 2012-04-13 DIAGNOSIS — I252 Old myocardial infarction: Secondary | ICD-10-CM | POA: Insufficient documentation

## 2012-04-13 DIAGNOSIS — Z79899 Other long term (current) drug therapy: Secondary | ICD-10-CM | POA: Insufficient documentation

## 2012-04-13 DIAGNOSIS — I10 Essential (primary) hypertension: Secondary | ICD-10-CM | POA: Insufficient documentation

## 2012-04-13 DIAGNOSIS — T17308A Unspecified foreign body in larynx causing other injury, initial encounter: Secondary | ICD-10-CM | POA: Insufficient documentation

## 2012-04-13 DIAGNOSIS — R1310 Dysphagia, unspecified: Secondary | ICD-10-CM

## 2012-04-13 DIAGNOSIS — K222 Esophageal obstruction: Secondary | ICD-10-CM | POA: Insufficient documentation

## 2012-04-13 DIAGNOSIS — K449 Diaphragmatic hernia without obstruction or gangrene: Secondary | ICD-10-CM | POA: Insufficient documentation

## 2012-04-13 DIAGNOSIS — IMO0002 Reserved for concepts with insufficient information to code with codable children: Secondary | ICD-10-CM | POA: Insufficient documentation

## 2012-04-13 DIAGNOSIS — J3489 Other specified disorders of nose and nasal sinuses: Secondary | ICD-10-CM | POA: Insufficient documentation

## 2012-04-13 DIAGNOSIS — J4489 Other specified chronic obstructive pulmonary disease: Secondary | ICD-10-CM | POA: Insufficient documentation

## 2012-04-13 NOTE — Procedures (Addendum)
Objective Swallowing Evaluation: Modified Barium Swallowing Study  Patient Details  Name: Roberto [name] MRN: [id number] Date of Birth: [birth year]  Today's Date: 04/13/2012 Time: 3086-5784 SLP Time Calculation (min): 50 min  Past Medical History:  Past Medical History  Diagnosis Date  . CAD (coronary artery disease)     post CABG with prior percutaneous intervention of the saphenous vein graft with known saphenous vein graft disease.  Marland Kitchen AAA (abdominal aortic aneurysm)     a. CTA 09/2011: 4.1-4.2cm infrarenal AAA.  Marland Kitchen Aneurysm of thoracic aorta 1995    a. Rupture 1995 w/spontaneous resolution;  b. CTA 09/2011: 6.8cm dist, 5.7cm mid  . Popliteal artery aneurysm   . Myocardial infarction   . Ischemic cardiomyopathy   . Paroxysmal atrial fibrillation   . Hypertension   . Peripheral vascular disease   . Dyslipidemia   . Diabetes mellitus 2007    dx'd 2007 w/persisting FBS>125  . GERD (gastroesophageal reflux disease)   . Hiatal hernia   . Diverticular disease   . BPH (benign prostatic hypertrophy)     with hx of acute urinary retention (pt can self-cath)  . COPD (chronic obstructive pulmonary disease)     05/09/08 PFT FEV1 1.84 (55%), FVC 4.42 (95%), TLC 6.46 (92%), DLCO 72%, no BD response  . Pneumonia 1/09  . Allergic rhinitis   . Shingles     X 2 episodes, both in left scapula area  . OSA (obstructive sleep apnea) 06/14/09    PSG RDI 17, PLMI 96, intolerant of CPAP or BPAP  . Chronic renal insufficiency     Baseline Cr 1.5  . Prostate cancer 07/2008    Low grade; watchful waiting with [name]  . Pulmonary nodule, left 03/02/2010    Left base; resolved on f/u CT  . Allergic rhinitis   . Carpal tunnel syndrome of left wrist 02/11/2011  . IMPLANTATION OF DEFIBRILLATOR, HX OF 09/02/2008    Medtronic 7232Cx Maximo V. Had a 6949 lead - failed and therapies turned off.  . IRON DEFICIENCY 06/08/2010    Hemoccult negative:  GI ([name]) recommended no invasive  diagnostics--watchful waiting (2012).  . UNSPECIFIED ANEMIA 06/01/2010    Multifactorial: iron def, CRI, vit B12 def,    Past Surgical History:  Past Surgical History  Procedure Date  . Cardiac defibrillator placement 08/28/2004    [name]  . Colon surgery 1995    partial colectomy  . Coronary artery bypass graft 1996  . Aorta - bilateral femoral artery bypass graft 1999    [name] Early  . Nose surgery   . Insertion of pacing lead     New rate sensing pacing lead with removal of a previous implanted ICD and insertion of a device back in the pocket with defibrillation threshold testing  . Abdominal aortic aneurysm repair 1999    Ingrarenal repair/ graft  . Stents     Placed to the saphenous vein graft  . Radiofrequency ablation 2006    A flutter   HPI:  76 yo male referred by Dr Christella Hartigan for MBS due to pt report of getting choked on liquids, but pt admits it can happen with food as well.  Pt states this can occur a few times a week and even a few times during the same meal.  Pt with complex medical hx including COPD, AAA, CAD s/p CAGG, MI, HTN, PVD, DM, GERD, hiatal hernia, diverticular dx, pna, allergic rhinitis, shingles - 2 episodes, CRI, OSA, allergic rhinitis,  anemia, iron deficiency.    Pt medication list is extensive but includes Zyrtec, lasix, mucinex, prilosec.  Pt states he does not have problems swallowing pills and can take several at one time.   Pt reports no problems with weight loss but does acknowledge occasional shortness of breath with eating and h/o pna.  Sinus drainage is an issue for this pt and he reports sensing secretions in pharynx chronically.        Assessment / Plan / Recommendation Clinical Impression  Dysphagia Diagnosis: Suspected primary esophageal dysphagia  Clinical impression: Pt presents with findings consistent with MBS completed 01/20/2012.  No aspiration or penetration was observed during testing - and SLP challenged pt to "chug" the liquid  barium.  Given pt has COPD, SLP questions if he may intermittently aspirate water due to discoordination of breathing and swallowing.  Mild pharyngeal residuals likely due to decr UES opening.    Unfortunately as pt did not cough during eval, it is difficult to isolate source of cough with liquids.  Pt appeared with stasis distally in esophagus (more with increased viscocity - pudding, cracker) WITHOUT sensation.  Thin liquid swallows x2 appeared to aid clearance but with a single episode of retrograde propulsion.  Radiologist not present to confirm findings.    Differential diagnosis for cough with intake may include episodic aspiration from respiratory/swallow discoordination if eating when dyspneic or/and reflux, dysmotitily, spasm occuring during meals as diagnosed on barium swallow 02/2012.    Pt did acknowledge he "turns his cup up" taking large sips and lays in recliner soon after meals.  Advised him to take small bites/sips, follow solids with several sips of room temperature liquid and stay upright for at least 30-45 minutes after meals in hopes to decrease pt's dysphagia symptoms.      Please note, pt also with ? Mild right facial asymmetry and slight lingual deviation to right upon protrusion.  Trace amount of viscous white colored secretions noted in posterior oral cavity.   Written instructions left with pt with SLP contact number.    Thanks for this referral.      Treatment Recommendation       Diet Recommendation Regular;Thin liquid   Liquid Administration via: Cup;Straw Medication Administration: Other (Comment) (as tolerated) Supervision: Patient able to self feed Compensations: Slow rate;Small sips/bites;Follow solids with liquid, intermittent dry swallow  Postural Changes and/or Swallow Maneuvers: Seated upright 90 degrees;Upright 30-60 min after meal    Other  Recommendations     Follow Up Recommendations  None    Frequency and Duration             General Date  of Onset: 04/13/12 HPI: 76 yo male referred by Dr Christella Hartigan for MBS due to pt report of getting choked on liquids, but pt admits it can happen with food as well.  Pt states this can occur a few times a week and even a few times during the same meal.  Pt with complex medical hx including COPD, AAA, CAD s/p CAGG, MI, HTN, PVD, DM, GERD, hiatal hernia, diverticular dx, pna, allergic rhinitis, shingles - 2 episodes, CRI, OSA, allergic rhinitis, anemia, iron deficiency.    Pt medication list is extensive but includes Zyrtec, lasix, mucinex, prilosec.  Pt states he does not have problems swallowing pills and can take several at one time.   Pt reports no problems with weight loss but does acknowledge occasional shortness of breath with eating and h/o pna.  Sinus drainage is an issue for this  pt and he reports sensing secretions in pharynx chronically.    Type of Study: Modified Barium Swallowing Study Reason for Referral: Objectively evaluate swallowing function Previous Swallow Assessment: MBS mildly decr UES relaxation and trace pharyngeal residuals post swallow with appearance of lower esophageal stasis without pt diet.  Rec regular/thin with precautions.  Esophagram 02/2012 showed Moderate esophageal dysmotility.  Mild to moderate esophageal spasm, distal esophageal ring, hiatal hernia, reflux.   Diet Prior to this Study: Regular;Thin liquids Temperature Spikes Noted: No Respiratory Status: Room air Behavior/Cognition: Alert;Cooperative;Pleasant mood Oral Cavity - Dentition: Adequate natural dentition Oral Motor / Sensory Function:  (slight facial asymmetry right, slight lingual dev right) Self-Feeding Abilities: Able to feed self Patient Positioning: Upright in chair Baseline Vocal Quality: Hoarse (pt reports chronic hoarseness ) Volitional Cough: Weak Volitional Swallow: Able to elicit Anatomy: Within functional limits Pharyngeal Secretions: Not observed secondary MBS    Reason for Referral  Objectively evaluate swallowing function   Oral Phase Oral Preparation/Oral Phase Oral Phase: WFL   Pharyngeal Phase Pharyngeal Phase Pharyngeal Phase: Impaired  Cervical Esophageal Phase    GO    Cervical Esophageal Phase Cervical Esophageal Phase: Impaired Cervical Esophageal Phase - Comment Cervical Esophageal Comment: appearance of decr UES relaxation/opening resulting in minimal stasis in pharynx - that appear to mix with secretions, pt with dysmotility, spasms, hiatal hernia, GERD per esophagram 02/2012    Functional Assessment Tool Used: MBS, clinical judgement Functional Limitations: Swallowing Swallow Current Status ([id number]): At least 1 percent but less than 20 percent impaired, limited or restricted Swallow Goal Status [phone]): At least 1 percent but less than 20 percent impaired, limited or restricted Swallow Discharge Status [id number]): At least 1 percent but less than 20 percent impaired, limited or restricted   Donavan Burnet, MS [facility] SLP [phone]

## 2012-04-14 ENCOUNTER — Encounter: Payer: Self-pay | Admitting: Family Medicine

## 2012-04-14 ENCOUNTER — Emergency Department (HOSPITAL_COMMUNITY): Payer: Medicare Other

## 2012-04-14 ENCOUNTER — Observation Stay (HOSPITAL_COMMUNITY)
Admission: EM | Admit: 2012-04-14 | Discharge: 2012-04-15 | Disposition: A | Discharge status: Home / Self Care | Payer: Medicare Other | Attending: Internal Medicine | Admitting: Internal Medicine

## 2012-04-14 ENCOUNTER — Ambulatory Visit (INDEPENDENT_AMBULATORY_CARE_PROVIDER_SITE_OTHER): Payer: Medicare Other | Admitting: Family Medicine

## 2012-04-14 ENCOUNTER — Encounter (HOSPITAL_COMMUNITY): Payer: Self-pay | Admitting: Neurology

## 2012-04-14 VITALS — BP 84/58 | HR 65 | Temp 99.0°F | Ht 70.5 in | Wt 171.0 lb

## 2012-04-14 DIAGNOSIS — I10 Essential (primary) hypertension: Secondary | ICD-10-CM | POA: Diagnosis present

## 2012-04-14 DIAGNOSIS — R262 Difficulty in walking, not elsewhere classified: Secondary | ICD-10-CM | POA: Insufficient documentation

## 2012-04-14 DIAGNOSIS — R531 Weakness: Secondary | ICD-10-CM | POA: Diagnosis present

## 2012-04-14 DIAGNOSIS — E119 Type 2 diabetes mellitus without complications: Secondary | ICD-10-CM

## 2012-04-14 DIAGNOSIS — I2589 Other forms of chronic ischemic heart disease: Secondary | ICD-10-CM | POA: Diagnosis present

## 2012-04-14 DIAGNOSIS — I4891 Unspecified atrial fibrillation: Secondary | ICD-10-CM

## 2012-04-14 DIAGNOSIS — E860 Dehydration: Secondary | ICD-10-CM

## 2012-04-14 DIAGNOSIS — I428 Other cardiomyopathies: Secondary | ICD-10-CM

## 2012-04-14 DIAGNOSIS — J449 Chronic obstructive pulmonary disease, unspecified: Secondary | ICD-10-CM

## 2012-04-14 DIAGNOSIS — G4733 Obstructive sleep apnea (adult) (pediatric): Secondary | ICD-10-CM | POA: Insufficient documentation

## 2012-04-14 DIAGNOSIS — I739 Peripheral vascular disease, unspecified: Secondary | ICD-10-CM | POA: Insufficient documentation

## 2012-04-14 DIAGNOSIS — Z7982 Long term (current) use of aspirin: Secondary | ICD-10-CM | POA: Insufficient documentation

## 2012-04-14 DIAGNOSIS — E538 Deficiency of other specified B group vitamins: Secondary | ICD-10-CM

## 2012-04-14 DIAGNOSIS — Z79899 Other long term (current) drug therapy: Secondary | ICD-10-CM | POA: Insufficient documentation

## 2012-04-14 DIAGNOSIS — Z7901 Long term (current) use of anticoagulants: Secondary | ICD-10-CM | POA: Insufficient documentation

## 2012-04-14 DIAGNOSIS — I959 Hypotension, unspecified: Secondary | ICD-10-CM

## 2012-04-14 DIAGNOSIS — N182 Chronic kidney disease, stage 2 (mild): Secondary | ICD-10-CM

## 2012-04-14 DIAGNOSIS — F329 Major depressive disorder, single episode, unspecified: Secondary | ICD-10-CM | POA: Diagnosis present

## 2012-04-14 DIAGNOSIS — D649 Anemia, unspecified: Secondary | ICD-10-CM | POA: Insufficient documentation

## 2012-04-14 DIAGNOSIS — J4489 Other specified chronic obstructive pulmonary disease: Secondary | ICD-10-CM

## 2012-04-14 DIAGNOSIS — I2510 Atherosclerotic heart disease of native coronary artery without angina pectoris: Secondary | ICD-10-CM

## 2012-04-14 DIAGNOSIS — E039 Hypothyroidism, unspecified: Secondary | ICD-10-CM | POA: Diagnosis present

## 2012-04-14 DIAGNOSIS — J189 Pneumonia, unspecified organism: Secondary | ICD-10-CM

## 2012-04-14 DIAGNOSIS — I252 Old myocardial infarction: Secondary | ICD-10-CM | POA: Insufficient documentation

## 2012-04-14 DIAGNOSIS — M6281 Muscle weakness (generalized): Principal | ICD-10-CM | POA: Insufficient documentation

## 2012-04-14 DIAGNOSIS — R197 Diarrhea, unspecified: Secondary | ICD-10-CM | POA: Insufficient documentation

## 2012-04-14 DIAGNOSIS — R42 Dizziness and giddiness: Secondary | ICD-10-CM | POA: Insufficient documentation

## 2012-04-14 DIAGNOSIS — I714 Abdominal aortic aneurysm, without rupture, unspecified: Secondary | ICD-10-CM | POA: Insufficient documentation

## 2012-04-14 DIAGNOSIS — E785 Hyperlipidemia, unspecified: Secondary | ICD-10-CM | POA: Insufficient documentation

## 2012-04-14 DIAGNOSIS — D510 Vitamin B12 deficiency anemia due to intrinsic factor deficiency: Secondary | ICD-10-CM | POA: Diagnosis present

## 2012-04-14 DIAGNOSIS — R5383 Other fatigue: Secondary | ICD-10-CM

## 2012-04-14 HISTORY — DX: Heart failure, unspecified: I50.9

## 2012-04-14 HISTORY — DX: Unspecified atrial fibrillation: I48.91

## 2012-04-14 HISTORY — DX: Hypotension, unspecified: I95.9

## 2012-04-14 LAB — CBC WITH DIFFERENTIAL/PLATELET
Basophils Absolute: 0 10*3/uL (ref 0.0–0.1)
Basophils Relative: 0 % (ref 0–1)
Eosinophils Relative: 4 % (ref 0–5)
Lymphocytes Relative: 18 % (ref 12–46)
MCHC: 31.2 g/dL (ref 30.0–36.0)
MCV: 93.3 fL (ref 78.0–100.0)
Neutro Abs: 4.3 10*3/uL (ref 1.7–7.7)
Platelets: 147 10*3/uL — ABNORMAL LOW (ref 150–400)
RDW: 14.4 % (ref 11.5–15.5)
WBC: 6 10*3/uL (ref 4.0–10.5)

## 2012-04-14 LAB — URINALYSIS, ROUTINE W REFLEX MICROSCOPIC
Bilirubin Urine: NEGATIVE
Glucose, UA: NEGATIVE mg/dL
Hgb urine dipstick: NEGATIVE
Specific Gravity, Urine: 1.01 (ref 1.005–1.030)
pH: 6.5 (ref 5.0–8.0)

## 2012-04-14 LAB — COMPREHENSIVE METABOLIC PANEL
ALT: 13 U/L (ref 0–53)
AST: 12 U/L (ref 0–37)
Albumin: 3.3 g/dL — ABNORMAL LOW (ref 3.5–5.2)
CO2: 26 mEq/L (ref 19–32)
Calcium: 8.9 mg/dL (ref 8.4–10.5)
GFR calc non Af Amer: 48 mL/min — ABNORMAL LOW (ref >90)
Sodium: 138 mEq/L (ref 135–145)

## 2012-04-14 LAB — POCT I-STAT TROPONIN I: Troponin i, poc: 0 ng/mL (ref 0.00–0.08)

## 2012-04-14 LAB — PROTIME-INR
INR: 2.69 — ABNORMAL HIGH (ref 0.00–1.49)
Prothrombin Time: 27.3 seconds — ABNORMAL HIGH (ref 11.6–15.2)

## 2012-04-14 LAB — CG4 I-STAT (LACTIC ACID): Lactic Acid, Venous: 0.91 mmol/L (ref 0.5–2.2)

## 2012-04-14 LAB — GLUCOSE, CAPILLARY: Glucose-Capillary: 145 mg/dL — ABNORMAL HIGH (ref 70–99)

## 2012-04-14 LAB — TROPONIN I: Troponin I: <0.3 ng/mL (ref <0.30)

## 2012-04-14 MED ORDER — FLUTICASONE PROPIONATE 50 MCG/ACT NA SUSP
2.0000 | Freq: Every day | NASAL | Status: DC
  Administered 2012-04-15: 2 via NASAL
  Filled 2012-04-14: qty 16

## 2012-04-14 MED ORDER — BUDESONIDE 0.25 MG/2ML IN SUSP
0.2500 mg | Freq: Two times a day (BID) | RESPIRATORY_TRACT | Status: DC
  Administered 2012-04-15: 0.25 mg via RESPIRATORY_TRACT
  Filled 2012-04-14 (×3): qty 2

## 2012-04-14 MED ORDER — GLUCERNA SHAKE PO LIQD
237.0000 mL | Freq: Three times a day (TID) | ORAL | Status: DC
  Administered 2012-04-14: 237 mL via ORAL

## 2012-04-14 MED ORDER — ASPIRIN EC 81 MG PO TBEC
81.0000 mg | DELAYED_RELEASE_TABLET | Freq: Every day | ORAL | Status: DC
  Administered 2012-04-15: 81 mg via ORAL
  Filled 2012-04-14: qty 1

## 2012-04-14 MED ORDER — LEVALBUTEROL TARTRATE 45 MCG/ACT IN AERO
1.0000 | INHALATION_SPRAY | RESPIRATORY_TRACT | Status: DC | PRN
  Administered 2012-04-15: 2 via RESPIRATORY_TRACT
  Filled 2012-04-14: qty 15

## 2012-04-14 MED ORDER — SODIUM CHLORIDE 0.9 % IV BOLUS (SEPSIS)
500.0000 mL | Freq: Once | INTRAVENOUS | Status: AC
  Administered 2012-04-14: 500 mL via INTRAVENOUS

## 2012-04-14 MED ORDER — AZITHROMYCIN 250 MG PO TABS
500.0000 mg | ORAL_TABLET | Freq: Once | ORAL | Status: AC
  Administered 2012-04-14: 500 mg via ORAL
  Filled 2012-04-14: qty 2

## 2012-04-14 MED ORDER — AMIODARONE HCL 200 MG PO TABS
200.0000 mg | ORAL_TABLET | Freq: Every day | ORAL | Status: DC
Start: 2012-04-15 — End: 2012-04-15
  Administered 2012-04-15: 200 mg via ORAL
  Filled 2012-04-14: qty 1

## 2012-04-14 MED ORDER — WARFARIN SODIUM 5 MG PO TABS: 5.0000 mg | ORAL_TABLET | ORAL | Status: DC

## 2012-04-14 MED ORDER — INSULIN ASPART 100 UNIT/ML ~~LOC~~ SOLN: 0.0000 [IU] | Freq: Every day | SUBCUTANEOUS | Status: DC

## 2012-04-14 MED ORDER — OMEPRAZOLE MAGNESIUM 20 MG PO TBEC: 20.0000 mg | DELAYED_RELEASE_TABLET | Freq: Two times a day (BID) | ORAL | Status: DC

## 2012-04-14 MED ORDER — ONDANSETRON HCL 4 MG PO TABS: 4.0000 mg | ORAL_TABLET | Freq: Four times a day (QID) | ORAL | Status: DC | PRN

## 2012-04-14 MED ORDER — LEVOFLOXACIN 500 MG PO TABS
500.0000 mg | ORAL_TABLET | ORAL | Status: DC
  Administered 2012-04-14: 500 mg via ORAL
  Filled 2012-04-14 (×2): qty 1

## 2012-04-14 MED ORDER — ONDANSETRON HCL 4 MG/2ML IJ SOLN: 4.0000 mg | Freq: Four times a day (QID) | INTRAMUSCULAR | Status: DC | PRN

## 2012-04-14 MED ORDER — LINAGLIPTIN 5 MG PO TABS
5.0000 mg | ORAL_TABLET | Freq: Every day | ORAL | Status: DC
  Administered 2012-04-15: 5 mg via ORAL
  Filled 2012-04-14: qty 1

## 2012-04-14 MED ORDER — WARFARIN SODIUM 2.5 MG PO TABS: 2.5000 mg | ORAL_TABLET | Freq: Every day | ORAL | Status: DC

## 2012-04-14 MED ORDER — POTASSIUM CHLORIDE ER 10 MEQ PO TBCR
10.0000 meq | EXTENDED_RELEASE_TABLET | Freq: Every day | ORAL | Status: DC
  Administered 2012-04-15: 10 meq via ORAL
  Filled 2012-04-14: qty 1

## 2012-04-14 MED ORDER — LITHIUM CARBONATE 300 MG PO CAPS
300.0000 mg | ORAL_CAPSULE | Freq: Two times a day (BID) | ORAL | Status: DC
  Administered 2012-04-15: 300 mg via ORAL
  Filled 2012-04-14 (×3): qty 1

## 2012-04-14 MED ORDER — LORATADINE 10 MG PO TABS
10.0000 mg | ORAL_TABLET | Freq: Every day | ORAL | Status: DC
  Administered 2012-04-15: 10 mg via ORAL
  Filled 2012-04-14: qty 1

## 2012-04-14 MED ORDER — GUAIFENESIN-DM 100-10 MG/5ML PO SYRP: 5.0000 mL | ORAL_SOLUTION | Freq: Three times a day (TID) | ORAL | Status: DC | PRN

## 2012-04-14 MED ORDER — ASPIRIN 81 MG PO TABS: 81.0000 mg | ORAL_TABLET | Freq: Every day | ORAL | Status: DC

## 2012-04-14 MED ORDER — PANTOPRAZOLE SODIUM 40 MG PO TBEC
40.0000 mg | DELAYED_RELEASE_TABLET | Freq: Two times a day (BID) | ORAL | Status: DC
  Administered 2012-04-15: 40 mg via ORAL
  Filled 2012-04-14: qty 1

## 2012-04-14 MED ORDER — LEVOTHYROXINE SODIUM 125 MCG PO TABS
125.0000 ug | ORAL_TABLET | Freq: Every day | ORAL | Status: DC
  Administered 2012-04-15: 125 ug via ORAL
  Filled 2012-04-14 (×2): qty 1

## 2012-04-14 MED ORDER — WARFARIN - PHARMACIST DOSING INPATIENT: Freq: Every day | Status: DC

## 2012-04-14 MED ORDER — TIOTROPIUM BROMIDE MONOHYDRATE 18 MCG IN CAPS
18.0000 ug | ORAL_CAPSULE | Freq: Every day | RESPIRATORY_TRACT | Status: DC
  Administered 2012-04-15: 18 ug via RESPIRATORY_TRACT
  Filled 2012-04-14: qty 5

## 2012-04-14 MED ORDER — ACETAMINOPHEN 325 MG PO TABS: 650.0000 mg | ORAL_TABLET | Freq: Four times a day (QID) | ORAL | Status: DC | PRN

## 2012-04-14 MED ORDER — CARVEDILOL 3.125 MG PO TABS
3.1250 mg | ORAL_TABLET | Freq: Two times a day (BID) | ORAL | Status: DC
  Administered 2012-04-14 – 2012-04-15 (×2): 3.125 mg via ORAL
  Filled 2012-04-14 (×3): qty 1

## 2012-04-14 MED ORDER — FINASTERIDE 5 MG PO TABS
5.0000 mg | ORAL_TABLET | Freq: Every day | ORAL | Status: DC
  Administered 2012-04-15: 5 mg via ORAL
  Filled 2012-04-14: qty 1

## 2012-04-14 MED ORDER — ISOSORBIDE MONONITRATE ER 30 MG PO TB24
30.0000 mg | ORAL_TABLET | Freq: Every day | ORAL | Status: DC
  Administered 2012-04-15: 30 mg via ORAL
  Filled 2012-04-14: qty 1

## 2012-04-14 MED ORDER — ISOSORBIDE MONONITRATE ER 30 MG PO TB24: 30.0000 mg | ORAL_TABLET | Freq: Every day | ORAL | Status: DC

## 2012-04-14 MED ORDER — SODIUM CHLORIDE 0.9 % IV SOLN: INTRAVENOUS | Status: DC

## 2012-04-14 MED ORDER — DEXTROSE 5 % IV SOLN
1.0000 g | Freq: Once | INTRAVENOUS | Status: AC
  Administered 2012-04-14: 1 g via INTRAVENOUS
  Filled 2012-04-14: qty 10

## 2012-04-14 MED ORDER — WARFARIN SODIUM 2.5 MG PO TABS
2.5000 mg | ORAL_TABLET | ORAL | Status: DC
  Administered 2012-04-14: 2.5 mg via ORAL
  Filled 2012-04-14 (×2): qty 1

## 2012-04-14 MED ORDER — GUAIFENESIN ER 600 MG PO TB12
1200.0000 mg | ORAL_TABLET | Freq: Two times a day (BID) | ORAL | Status: DC | PRN
  Filled 2012-04-14: qty 2

## 2012-04-14 MED ORDER — ALBUTEROL SULFATE (5 MG/ML) 0.5% IN NEBU: 2.5000 mg | INHALATION_SOLUTION | Freq: Four times a day (QID) | RESPIRATORY_TRACT | Status: DC | PRN

## 2012-04-14 MED ORDER — ACETAMINOPHEN 650 MG RE SUPP: 650.0000 mg | Freq: Four times a day (QID) | RECTAL | Status: DC | PRN

## 2012-04-14 MED ORDER — INSULIN ASPART 100 UNIT/ML ~~LOC~~ SOLN: 0.0000 [IU] | Freq: Three times a day (TID) | SUBCUTANEOUS | Status: DC

## 2012-04-14 MED ORDER — TAMSULOSIN HCL 0.4 MG PO CAPS
0.4000 mg | ORAL_CAPSULE | Freq: Every day | ORAL | Status: DC
  Administered 2012-04-14 – 2012-04-15 (×2): 0.4 mg via ORAL
  Filled 2012-04-14 (×2): qty 1

## 2012-04-14 MED ORDER — SODIUM CHLORIDE 0.9 % IV SOLN
INTRAVENOUS | Status: AC
  Administered 2012-04-14: 18:00:00 via INTRAVENOUS

## 2012-04-14 NOTE — Progress Notes (Addendum)
ANTICOAGULATION CONSULT NOTE - Initial Consult  Pharmacy Consult for Levaquin and Coumadin Indication: Bronchitis and Afib  Allergies  Allergen Reactions  . Atorvastatin     REACTION: muscle ache in legs  . Crestor (Rosuvastatin Calcium) Other (See Comments)    Lower ext fatigue and soreness- if taken everyday  . Lisinopril Diarrhea  . Morphine     REACTION: AMS - agitation  . Prednisone     REACTION: Can't breath  . Zolpidem Tartrate     REACTION: AMS    Patient Measurements: Height: 5\' 11"  (180.3 cm) Weight: 165 lb 6.4 oz (75.025 kg) (scale A) IBW/kg (Calculated) : 75.3  Heparin Dosing Weight:   Vital Signs: Temp: 97.5 F (36.4 C) (12/03 1821) Temp src: Oral (12/03 1821) BP: 113/71 mmHg (12/03 1821) Pulse Rate: 74  (12/03 1821)  Labs:  Basename 04/14/12 1750 04/14/12 1419  HGB -- 11.3*  HCT -- 36.2*  PLT -- 147*  APTT -- --  LABPROT -- 27.3*  INR -- 2.69*  HEPARINUNFRC -- --  CREATININE -- 1.41*  CKTOTAL -- --  CKMB -- --  TROPONINI <0.30 --    Estimated Creatinine Clearance: 48.8 ml/min (by C-G formula based on Cr of 1.41).   Medical History: Past Medical History  Diagnosis Date  . CAD (coronary artery disease)     post CABG with prior percutaneous intervention of the saphenous vein graft with known saphenous vein graft disease.  Marland Kitchen AAA (abdominal aortic aneurysm)     a. CTA 09/2011: 4.1-4.2cm infrarenal AAA.  Marland Kitchen Aneurysm of thoracic aorta 1995    a. Rupture 1995 w/spontaneous resolution;  b. CTA 09/2011: 6.8cm dist, 5.7cm mid  . Popliteal artery aneurysm   . Myocardial infarction   . Ischemic cardiomyopathy   . Paroxysmal atrial fibrillation   . Hypertension   . Peripheral vascular disease   . Dyslipidemia   . Diabetes mellitus 2007    dx'd 2007 w/persisting FBS>125  . GERD (gastroesophageal reflux disease)   . Hiatal hernia   . Diverticular disease   . BPH (benign prostatic hypertrophy)     with hx of acute urinary retention (pt can  self-cath)  . COPD (chronic obstructive pulmonary disease)     05/09/08 PFT FEV1 1.84 (55%), FVC 4.42 (95%), TLC 6.46 (92%), DLCO 72%, no BD response  . Pneumonia 1/09  . Allergic rhinitis   . Shingles     X 2 episodes, both in left scapula area  . OSA (obstructive sleep apnea) 06/14/09    PSG RDI 17, PLMI 96, intolerant of CPAP or BPAP  . Chronic renal insufficiency     Baseline Cr 1.5  . Prostate cancer 07/2008    Low grade; watchful waiting with [name]  . Pulmonary nodule, left 03/02/2010    Left base; resolved on f/u CT  . Allergic rhinitis   . Carpal tunnel syndrome of left wrist 02/11/2011  . IMPLANTATION OF DEFIBRILLATOR, HX OF 09/02/2008    [employer] 7232Cx Maximo V. Had a 6949 lead - failed and therapies turned off.  . IRON DEFICIENCY 06/08/2010    Hemoccult negative:  GI ([name]) recommended no invasive diagnostics--watchful waiting (2012).  . UNSPECIFIED ANEMIA 06/01/2010    Multifactorial: iron def, CRI, vit B12 def,   . Hypotension 04/14/2012  . CHF (congestive heart failure)   . Atrial fibrillation     Medications:  Prescriptions prior to admission  Medication Sig Dispense Refill  . acetaminophen (TYLENOL) 325 MG tablet Take 325 mg by  mouth every 6 (six) hours as needed. For pain      . albuterol (PROVENTIL) (2.5 MG/3ML) 0.083% nebulizer solution Take 2.5 mg by nebulization every 6 (six) hours as needed. For shortness of breath      . amiodarone (PACERONE) 200 MG tablet Take 200 mg by mouth daily.       Marland Kitchen aspirin 81 MG tablet Take 81 mg by mouth daily.        . budesonide (PULMICORT) 0.25 MG/2ML nebulizer solution Take 2 mLs (0.25 mg total) by nebulization 2 (two) times daily.  60 mL  5  . budesonide (RHINOCORT AQUA) 32 MCG/ACT nasal spray Place 2 sprays into the nose daily.  1 Bottle  12  . carvedilol (COREG) 3.125 MG tablet Take 1 tablet (3.125 mg total) by mouth 2 (two) times daily.  60 tablet  3  . cetirizine (ZYRTEC) 10 MG tablet Take 10 mg by mouth  daily.        . clotrimazole-betamethasone (LOTRISONE) cream Apply 1 application topically 2 (two) times daily as needed. For skin irritation      . cyanocobalamin 1000 MCG/ML injection Inject into the muscle every 30 (thirty) days.        Marland Kitchen desoximetasone (TOPICORT) 0.25 % cream Apply 1 application topically 2 (two) times daily as needed. For rash      . finasteride (PROSCAR) 5 MG tablet Take 5 mg by mouth daily.        . furosemide (LASIX) 40 MG tablet Take 40 mg by mouth daily.      Marland Kitchen guaiFENesin (MUCINEX) 600 MG 12 hr tablet Take 1,200 mg by mouth 2 (two) times daily as needed. For congestion      . guaiFENesin-dextromethorphan (ROBITUSSIN DM) 100-10 MG/5ML syrup Take 5 mLs by mouth 3 (three) times daily as needed. As needed for cough      . ipratropium (ATROVENT) 0.03 % nasal spray Two sprays three times a day as needed before meals      . isosorbide mononitrate (IMDUR) 60 MG 24 hr tablet Take 1 tablet (60 mg total) by mouth daily.  1 tablet  0  . levalbuterol (XOPENEX HFA) 45 MCG/ACT inhaler Inhale 1-2 puffs into the lungs every 4 (four) hours as needed. For shortness of breath  1 Inhaler  6  . levothyroxine (SYNTHROID, LEVOTHROID) 125 MCG tablet Take 125 mcg by mouth daily.      Marland Kitchen lithium carbonate 300 MG capsule Take 300 mg by mouth 2 (two) times daily with a meal.      . nitroGLYCERIN (NITROSTAT) 0.3 MG SL tablet Place 0.3 mg under the tongue every 5 (five) minutes as needed. For chest pain      . olmesartan (BENICAR) 5 MG tablet Take 1 tablet (5 mg total) by mouth daily.  30 tablet  5  . omeprazole (PRILOSEC OTC) 20 MG tablet Take 1 tablet (20 mg total) by mouth 2 (two) times daily.  60 tablet  0  . potassium chloride (K-DUR) 10 MEQ tablet Take 10 mEq by mouth daily.      . sitaGLIPtin (JANUVIA) 100 MG tablet Take 1 tablet (100 mg total) by mouth daily.  30 tablet  5  . Tamsulosin HCl (FLOMAX) 0.4 MG CAPS Take 0.4 mg by mouth daily.        Marland Kitchen tiotropium (SPIRIVA) 18 MCG inhalation capsule  Place 18 mcg into inhaler and inhale daily.      Marland Kitchen warfarin (COUMADIN) 5 MG tablet Take 2.5-5 mg  by mouth daily. Take 1 tab on Monday and Friday. Take 1/2 a tab the rest of the week        Assessment: Generalized weakness  1. DOA, weakness, SOB, dizziness with hypotension. Treat for dehydration and chronic cough/bronchitis with h/o COPD. Afebrile. Levaquin x 5d. CrCl 45-50 (CKD).  2. Chronic anticoagulation for afib. Significant cardiac PMH. Cycle cardiac enzymes empirically. INR currently therapeutic at 2.69 but watch for drug interaction with Levaquin.   Goal of Therapy:  INR 2-3 Monitor platelets by anticoagulation protocol: Yes   Plan:  Levaquin 500mg  po q48h Home Coumadin regimen ok at 5mg  MF, 2.5mg  other days Daily PT/INR  Roberto [name] 04/14/2012,6:45 PM

## 2012-04-14 NOTE — H&P (Signed)
Patient's PCP: Jeoffrey Massed, MD Patient's cardiologist: Dr. Riley Kill Patient's pulmonologist: Dr. [name]  Chief Complaint: Generalized weakness  History of Present Illness: Roberto [name] is a 76 y.o. Caucasian male CAD, AAA, ischemic cardiomyopathy, paroxysmal atrial fibrillation, DM, hyperlipemia, DM, GERD, diverticular disease, BPH, COPD, CKD stage III, chronic systolic congestive heart failure who presents with the above complaints.  Patient was recently hospitalized from 01/19/2012 till 01/20/2012 for chest pain and COPD exacerbation.  Patient reports that over the last few years he has had dyspnea on exertion.  Over the last 3 days he has noted increased weakness and shortness of breath with exertion.  He checked his blood pressure over the weekend and noted his blood pressure was 80/53.  He went to his primary care physician's office and saw Dr. Abner Greenspan.  His blood pressure at his primary care physician's office was 84/58 as a result he was referred to the emergency department for further evaluation.  He also has noted dizziness at times.  He denies any recent fevers, chills, nausea or vomiting.  Denies any chest pain or shortness of breath.  Denies any abdominal pain.  Had diarrhea yesterday and has had no further episodes.  Has chronic headaches but has improved recently.  Denies any vision changes.  Reports having chronic nonproductive cough which is unchanged.  Past Medical History  Diagnosis Date  . CAD (coronary artery disease)     post CABG with prior percutaneous intervention of the saphenous vein graft with known saphenous vein graft disease.  Marland Kitchen AAA (abdominal aortic aneurysm)     a. CTA 09/2011: 4.1-4.2cm infrarenal AAA.  Marland Kitchen Aneurysm of thoracic aorta 1995    a. Rupture 1995 w/spontaneous resolution;  b. CTA 09/2011: 6.8cm dist, 5.7cm mid  . Popliteal artery aneurysm   . Myocardial infarction   . Ischemic cardiomyopathy   . Paroxysmal atrial fibrillation   . Hypertension   .  Peripheral vascular disease   . Dyslipidemia   . Diabetes mellitus 2007    dx'd 2007 w/persisting FBS>125  . GERD (gastroesophageal reflux disease)   . Hiatal hernia   . Diverticular disease   . BPH (benign prostatic hypertrophy)     with hx of acute urinary retention (pt can self-cath)  . COPD (chronic obstructive pulmonary disease)     05/09/08 PFT FEV1 1.84 (55%), FVC 4.42 (95%), TLC 6.46 (92%), DLCO 72%, no BD response  . Pneumonia 1/09  . Allergic rhinitis   . Shingles     X 2 episodes, both in left scapula area  . OSA (obstructive sleep apnea) 06/14/09    PSG RDI 17, PLMI 96, intolerant of CPAP or BPAP  . Chronic renal insufficiency     Baseline Cr 1.5  . Prostate cancer 07/2008    Low grade; watchful waiting with [name]  . Pulmonary nodule, left 03/02/2010    Left base; resolved on f/u CT  . Allergic rhinitis   . Carpal tunnel syndrome of left wrist 02/11/2011  . IMPLANTATION OF DEFIBRILLATOR, HX OF 09/02/2008    Medtronic 7232Cx Maximo V. Had a 6949 lead - failed and therapies turned off.  . IRON DEFICIENCY 06/08/2010    Hemoccult negative:  GI ([name]) recommended no invasive diagnostics--watchful waiting (2012).  . UNSPECIFIED ANEMIA 06/01/2010    Multifactorial: iron def, CRI, vit B12 def,   . Hypotension 04/14/2012  . CHF (congestive heart failure)   . Atrial fibrillation    Past Surgical History  Procedure Date  . Cardiac defibrillator  placement 08/28/2004    [name]  . Colon surgery 1995    partial colectomy  . Coronary artery bypass graft 1996  . Aorta - bilateral femoral artery bypass graft 1999    [name] Early  . Nose surgery   . Insertion of pacing lead     New rate sensing pacing lead with removal of a previous implanted ICD and insertion of a device back in the pocket with defibrillation threshold testing  . Abdominal aortic aneurysm repair 1999    Ingrarenal repair/ graft  . Stents     Placed to the saphenous vein graft  .  Radiofrequency ablation 2006    A flutter   Family History  Problem Relation Age of Onset  . Stroke Mother 83  . Heart attack Mother     CVA  . Cancer Father 68    Lung  . Heart attack Father 49  . Lung cancer Father   . Coronary artery disease Other     2 of 5 siblings with CAD   History   Social History  . Marital Status: Married    Spouse Name: N/A    Number of Children: N/A  . Years of Education: N/A   Occupational History  . RETIRED     Mechanic   Social History Main Topics  . Smoking status: Former Smoker -- 1.0 packs/day for 30 years    Types: Cigarettes    Quit date: 05/23/2003  . Smokeless tobacco: Former Neurosurgeon    Types: Chew    Quit date: 05/14/2003     Comment: 80 pack a year history  . Alcohol Use: 0.6 oz/week    1 Cans of beer per week     Comment: occasionally  . Drug Use: No  . Sexually Active: Not on file   Other Topics Concern  . Not on file   Social History Narrative   Lives in [place] with his wife.Very involved in his church.Enjoys bluegrass music, going to [employer], spending time with extended family.Used to play golf twice a week up until 2012.No alc/drug use.Former smoker, quit 2005.   Allergies: Atorvastatin; Crestor; Lisinopril; Morphine; Prednisone; and Zolpidem tartrate  Home Meds: Prior to Admission medications   Medication Sig Start Date End Date Taking? Authorizing Provider  acetaminophen (TYLENOL) 325 MG tablet Take 325 mg by mouth every 6 (six) hours as needed. For pain   Yes Historical Provider, MD  albuterol (PROVENTIL) (2.5 MG/3ML) 0.083% nebulizer solution Take 2.5 mg by nebulization every 6 (six) hours as needed. For shortness of breath   Yes Historical Provider, MD  amiodarone (PACERONE) 200 MG tablet Take 200 mg by mouth daily.  08/12/11  Yes Tonny Bollman, MD  aspirin 81 MG tablet Take 81 mg by mouth daily.     Yes Historical Provider, MD  budesonide (PULMICORT) 0.25 MG/2ML nebulizer solution Take 2 mLs (0.25 mg total)  by nebulization 2 (two) times daily. 08/07/11  Yes Coralyn Helling, MD  budesonide (RHINOCORT AQUA) 32 MCG/ACT nasal spray Place 2 sprays into the nose daily. 08/08/11 08/07/12 Yes Jeoffrey Massed, MD  carvedilol (COREG) 3.125 MG tablet Take 1 tablet (3.125 mg total) by mouth 2 (two) times daily. 02/04/12 02/03/13 Yes Ok Anis, NP  cetirizine (ZYRTEC) 10 MG tablet Take 10 mg by mouth daily.     Yes Historical Provider, MD  clotrimazole-betamethasone (LOTRISONE) cream Apply 1 application topically 2 (two) times daily as needed. For skin irritation   Yes Historical Provider,  MD  cyanocobalamin 1000 MCG/ML injection Inject into the muscle every 30 (thirty) days.     Yes Historical Provider, MD  desoximetasone (TOPICORT) 0.25 % cream Apply 1 application topically 2 (two) times daily as needed. For rash   Yes Historical Provider, MD  finasteride (PROSCAR) 5 MG tablet Take 5 mg by mouth daily.     Yes Historical Provider, MD  furosemide (LASIX) 40 MG tablet Take 40 mg by mouth daily.   Yes Historical Provider, MD  guaiFENesin (MUCINEX) 600 MG 12 hr tablet Take 1,200 mg by mouth 2 (two) times daily as needed. For congestion   Yes Historical Provider, MD  guaiFENesin-dextromethorphan (ROBITUSSIN DM) 100-10 MG/5ML syrup Take 5 mLs by mouth 3 (three) times daily as needed. As needed for cough   Yes Historical Provider, MD  ipratropium (ATROVENT) 0.03 % nasal spray Two sprays three times a day as needed before meals 09/10/10 10/08/12 Yes Coralyn Helling, MD  isosorbide mononitrate (IMDUR) 60 MG 24 hr tablet Take 1 tablet (60 mg total) by mouth daily. 03/17/12  Yes Herby Abraham, MD  levalbuterol [facility] HFA) 45 MCG/ACT inhaler Inhale 1-2 puffs into the lungs every 4 (four) hours as needed. For shortness of breath 08/28/11  Yes Coralyn Helling, MD  levothyroxine (SYNTHROID, LEVOTHROID) 125 MCG tablet Take 125 mcg by mouth daily.   Yes Historical Provider, MD  lithium carbonate 300 MG capsule Take 300 mg by mouth 2  (two) times daily with a meal.   Yes Historical Provider, MD  nitroGLYCERIN (NITROSTAT) 0.3 MG SL tablet Place 0.3 mg under the tongue every 5 (five) minutes as needed. For chest pain   Yes Historical Provider, MD  olmesartan (BENICAR) 5 MG tablet Take 1 tablet (5 mg total) by mouth daily. 10/28/11 10/27/12 Yes Jeoffrey Massed, MD  omeprazole (PRILOSEC OTC) 20 MG tablet Take 1 tablet (20 mg total) by mouth 2 (two) times daily. 01/20/12 01/19/13 Yes Hollice Espy, MD  potassium chloride (K-DUR) 10 MEQ tablet Take 10 mEq by mouth daily.   Yes Historical Provider, MD  sitaGLIPtin (JANUVIA) 100 MG tablet Take 1 tablet (100 mg total) by mouth daily. 02/17/12  Yes Jeoffrey Massed, MD  Tamsulosin HCl (FLOMAX) 0.4 MG CAPS Take 0.4 mg by mouth daily.     Yes Historical Provider, MD  tiotropium (SPIRIVA) 18 MCG inhalation capsule Place 18 mcg into inhaler and inhale daily.   Yes Historical Provider, MD  warfarin (COUMADIN) 5 MG tablet Take 2.5-5 mg by mouth daily. Take 1 tab on Monday and Friday. Take 1/2 a tab the rest of the week   Yes Historical Provider, MD    Review of Systems: All systems reviewed with the patient and positive as per history of present illness, otherwise all other systems are negative.   Physical Exam: Blood pressure 125/94, pulse 97, temperature 97.6 F (36.4 C), temperature source Oral, resp. rate 20, SpO2 98.00%. General: Awake, Oriented x3, No acute distress. HEENT: EOMI, Moist mucous membranes Neck: Supple CV: S1 and S2 Lungs: Clear to ascultation bilaterally, scattered upper airway wheezing, no appreciable lower airway wheezing. Abdomen: Soft, Nontender, Nondistended, +bowel sounds. Ext: Good pulses. Trace edema. No clubbing or cyanosis noted. Neuro: Cranial Nerves II-XII grossly intact. Has 5/5 motor strength in upper and lower extremities.  Lab results:  [facility] 04/14/12 1419  NA 138  K 3.7  CL 105  CO2 26  GLUCOSE 109*  BUN 23  CREATININE 1.41*  CALCIUM 8.9   MG --  PHOS --    Basename 04/14/12 1419  AST 12  ALT 13  ALKPHOS 61  BILITOT 0.5  PROT 5.8*  ALBUMIN 3.3*   No results found for this basename: LIPASE:2,AMYLASE:2 in the last 72 hours  Basename 04/14/12 1419  WBC 6.0  NEUTROABS 4.3  HGB 11.3*  HCT 36.2*  MCV 93.3  PLT 147*   No results found for this basename: CKTOTAL:3,CKMB:3,CKMBINDEX:3,TROPONINI:3 in the last 72 hours No components found with this basename: POCBNP:3 No results found for this basename: DDIMER in the last 72 hours No results found for this basename: HGBA1C:2 in the last 72 hours No results found for this basename: CHOL:2,HDL:2,LDLCALC:2,TRIG:2,CHOLHDL:2,LDLDIRECT:2 in the last 72 hours No results found for this basename: TSH,T4TOTAL,FREET3,T3FREE,THYROIDAB in the last 72 hours No results found for this basename: VITAMINB12:2,FOLATE:2,FERRITIN:2,TIBC:2,IRON:2,RETICCTPCT:2 in the last 72 hours Imaging results:  Dg Chest 2 View  04/14/2012  *RADIOLOGY REPORT*  Clinical Data: Weakness  CHEST - 2 VIEW  Comparison: 01/19/2012  Findings: Cardiomegaly again noted.  Status post CABG.  Dual lead cardiac pacemaker is unchanged in position.  No pulmonary edema. There is left basilar atelectasis early infiltrate or scarring. Hyperinflation again noted.  IMPRESSION: Hyperinflation.  Status post CABG.  There is left basilar atelectasis, scarring or early infiltrate.  No pulmonary edema.   Original Report Authenticated By: Natasha Mead, M.D.    Dg Swallowing Func-speech Pathology  04/13/2012  Chales Abrahams, CCC-SLP     04/13/2012  2:39 PM Objective Swallowing Evaluation: Modified Barium Swallowing Study   Patient Details  Name: Roberto [name] MRN: [id number] Date of Birth: 02/08/1936  Today's Date: 04/13/2012 Time: 4098-1191 SLP Time Calculation (min): 50 min  Past Medical History:  Past Medical History  Diagnosis Date  . CAD (coronary artery disease)     post CABG with prior percutaneous intervention of the saphenous  vein  graft with known saphenous vein graft disease.  Marland Kitchen AAA (abdominal aortic aneurysm)     a. CTA 09/2011: 4.1-4.2cm infrarenal AAA.  Marland Kitchen Aneurysm of thoracic aorta 1995    a. Rupture 1995 w/spontaneous resolution;  b. CTA 09/2011: 6.8cm  dist, 5.7cm mid  . Popliteal artery aneurysm   . Myocardial infarction   . Ischemic cardiomyopathy   . Paroxysmal atrial fibrillation   . Hypertension   . Peripheral vascular disease   . Dyslipidemia   . Diabetes mellitus 2007    dx'd 2007 w/persisting FBS>125  . GERD (gastroesophageal reflux disease)   . Hiatal hernia   . Diverticular disease   . BPH (benign prostatic hypertrophy)     with hx of acute urinary retention (pt can self-cath)  . COPD (chronic obstructive pulmonary disease)     05/09/08 PFT FEV1 1.84 (55%), FVC 4.42 (95%), TLC 6.46 (92%),  DLCO 72%, no BD response  . Pneumonia 1/09  . Allergic rhinitis   . Shingles     X 2 episodes, both in left scapula area  . OSA (obstructive sleep apnea) 06/14/09    PSG RDI 17, PLMI 96, intolerant of CPAP or BPAP  . Chronic renal insufficiency     Baseline Cr 1.5  . Prostate cancer 07/2008    Low grade; watchful waiting with [name]  . Pulmonary nodule, left 03/02/2010    Left base; resolved on f/u CT  . Allergic rhinitis   . Carpal tunnel syndrome of left wrist 02/11/2011  . IMPLANTATION OF DEFIBRILLATOR, HX OF 09/02/2008    Medtronic 7232Cx Maximo V. Had a 6949 lead - failed  and  therapies turned off.  . IRON DEFICIENCY 06/08/2010    Hemoccult negative:  GI ([name]) recommended no invasive  diagnostics--watchful waiting (2012).  . UNSPECIFIED ANEMIA 06/01/2010    Multifactorial: iron def, CRI, vit B12 def,    Past Surgical History:  Past Surgical History  Procedure Date  . Cardiac defibrillator placement 08/28/2004    [name]  . Colon surgery 1995    partial colectomy  . Coronary artery bypass graft 1996  . Aorta - bilateral femoral artery bypass graft 1999    [name] Early  . Nose surgery   . Insertion of pacing lead      New rate sensing pacing lead with removal of a previous  implanted ICD and insertion of a device back in the pocket with  defibrillation threshold testing  . Abdominal aortic aneurysm repair 1999    Ingrarenal repair/ graft  . Stents     Placed to the saphenous vein graft  . Radiofrequency ablation 2006    A flutter   HPI:  76 yo male referred by Dr Christella Hartigan for MBS due to pt report of  getting choked on liquids, but pt admits it can happen with food  as well.  Pt states this can occur a few times a week and even a  few times during the same meal.  Pt with complex medical hx  including COPD, AAA, CAD s/p CAGG, MI, HTN, PVD, DM, GERD, hiatal  hernia, diverticular dx, pna, allergic rhinitis, shingles - 2  episodes, CRI, OSA, allergic rhinitis, anemia, iron deficiency.     Pt medication list is extensive but includes Zyrtec, lasix,  mucinex, prilosec.  Pt states he does not have problems  swallowing pills and can take several at one time.   Pt reports  no problems with weight loss but does acknowledge occasional  shortness of breath with eating and h/o pna.  Sinus drainage is  an issue for this pt and he reports sensing secretions in pharynx  chronically.        Assessment / Plan / Recommendation Clinical Impression  Dysphagia Diagnosis: Suspected primary esophageal dysphagia  Clinical impression: Pt presents with findings consistent with  MBS completed 01/20/2012.  No aspiration or penetration was  observed during testing - and SLP challenged pt to "chug" the  liquid barium.  Given pt has COPD, SLP questions if he may  intermittently aspirate water due to discoordination of breathing  and swallowing.  Mild pharyngeal residuals likely due to decr UES  opening.    Unfortunately as pt did not cough during eval, it is difficult to  isolate source of cough with liquids.  Pt appeared with stasis  distally in esophagus (more with increased viscocity - pudding,  cracker) WITHOUT sensation.  Thin liquid swallows x2 appeared to   aid clearance but with a single episode of retrograde propulsion.   Radiologist not present to confirm findings.    Differential diagnosis for cough with intake may include episodic  aspiration from respiratory/swallow discoordination if eating  when dyspneic or/and reflux, dysmotitily, spasm occuring during  meals as diagnosed on barium swallow 02/2012.    Pt did acknowledge he "turns his cup up" taking large sips and  lays in recliner soon after meals.  Advised him to take small  bites/sips, follow solids with several sips of room temperature  liquid and stay upright for at least 30-45 minutes after meals in  hopes to decrease pt's dysphagia symptoms.  Please note, pt also with ? Mild right facial asymmetry and  slight lingual deviation to right upon protrusion.  Trace amount  of viscous white colored secretions noted in posterior oral  cavity.   Written instructions left with pt with SLP contact number.    Thanks for this referral.      Treatment Recommendation       Diet Recommendation Regular;Thin liquid   Liquid Administration via: Cup;Straw Medication Administration: Other (Comment) (as tolerated) Supervision: Patient able to self feed Compensations: Slow rate;Small sips/bites;Follow solids with  liquid, intermittent dry swallow  Postural Changes and/or Swallow Maneuvers: Seated upright 90  degrees;Upright 30-60 min after meal    Other  Recommendations     Follow Up Recommendations  None    Frequency and Duration             General Date of Onset: 04/13/12 HPI: 76 yo male referred by Dr Christella Hartigan for MBS due to pt report of  getting choked on liquids, but pt admits it can happen with food  as well.  Pt states this can occur a few times a week and even a  few times during the same meal.  Pt with complex medical hx  including COPD, AAA, CAD s/p CAGG, MI, HTN, PVD, DM, GERD, hiatal  hernia, diverticular dx, pna, allergic rhinitis, shingles - 2  episodes, CRI, OSA, allergic rhinitis, anemia, iron deficiency.      Pt medication list is extensive but includes Zyrtec, lasix,  mucinex, prilosec.  Pt states he does not have problems  swallowing pills and can take several at one time.   Pt reports  no problems with weight loss but does acknowledge occasional  shortness of breath with eating and h/o pna.  Sinus drainage is  an issue for this pt and he reports sensing secretions in pharynx  chronically.    Type of Study: Modified Barium Swallowing Study Reason for Referral: Objectively evaluate swallowing function Previous Swallow Assessment: MBS mildly decr UES relaxation and  trace pharyngeal residuals post swallow with appearance of lower  esophageal stasis without pt diet.  Rec regular/thin with  precautions.  Esophagram 02/2012 showed Moderate esophageal  dysmotility.  Mild to moderate esophageal spasm, distal  esophageal ring, hiatal hernia, reflux.   Diet Prior to this Study: Regular;Thin liquids Temperature Spikes Noted: No Respiratory Status: Room air Behavior/Cognition: Alert;Cooperative;Pleasant mood Oral Cavity - Dentition: Adequate natural dentition Oral Motor / Sensory Function:  (slight facial asymmetry right,  slight lingual dev right) Self-Feeding Abilities: Able to feed self Patient Positioning: Upright in chair Baseline Vocal Quality: Hoarse (pt reports chronic hoarseness ) Volitional Cough: Weak Volitional Swallow: Able to elicit Anatomy: Within functional limits Pharyngeal Secretions: Not observed secondary MBS    Reason for Referral Objectively evaluate swallowing function   Oral Phase Oral Preparation/Oral Phase Oral Phase: WFL   Pharyngeal Phase Pharyngeal Phase Pharyngeal Phase: Impaired  Cervical Esophageal Phase    GO    Cervical Esophageal Phase Cervical Esophageal Phase: Impaired Cervical Esophageal Phase - Comment Cervical Esophageal Comment: appearance of decr UES  relaxation/opening resulting in minimal stasis in pharynx - that  appear to mix with secretions, pt with dysmotility, spasms,  hiatal  hernia, GERD per esophagram 02/2012    Functional Assessment Tool Used: MBS, clinical judgement Functional Limitations: Swallowing Swallow Current Status (G9562): At least 1 percent but less than  20 percent impaired, limited or restricted Swallow Goal Status [id number]): At least 1 percent but less than 20  percent impaired,  limited or restricted Swallow Discharge Status [phone]): At least 1 percent but less  than 20 percent impaired, limited or restricted   Donavan Burnet, MS [facility] SLP [phone]     Other results: EKG: A. fib with rate of 96.  Assessment & Plan by Problem: Generalized weakness Etiology unclear.  Suspect is likely due to dehydration.  Patient feels improved with gentle hydration in the emergency department.  Will request PT evaluation.  Admit to telemetry and cycle cardiac enzymes given his cardiac history.  Dehydration Improved with hydration.  Suspect is likely due to diuretics.  Patient may have poor by mouth intake at home.  Furosemide held for now, and depending on patient's clinical course consider restarting tomorrow.  Will check orthostatic vitals on admission as they may be unreliable as patient has received IV fluids.  Hypertension with hypotension in the emergency department Improved with hydration.  Furosemide held. ARB held.  Continue carvedilol at current dose with hold parameters.  Decreased patient's dose of Imdur from 60 to 30 mg daily.  Depending on patient's clinical course consider restarting furosemide and ARB, and increasing Imdur dose.  Chronic cough/bronchitis versus early pneumonia On exam not impressive for pneumonia.  Patient afebrile and has no leukocytosis.  Will treat empirically for presumed bronchitis with levofloxacin for a total of 5 days.  COPD Patient compensated at this time.  Continue home medications.  Hypothyroidism Continue home medications.  Coronary artery disease/ischemic cardiomyopathy/chronic systolic congestive heart failure EF 30-35%  racemic 2-D echocardiogram on 11/06/2011.  Continue carvedilol. ARB (olemsartan) held due to hypotension.  Diabetes type 2 uncontrolled with complications Diabetic diet.  Sliding scale insulin. Patient on sitagliptin which has been substituted with linagliptin.  Poor by mouth intake/malnutrition Start Glucerna to supplement his nutrition.  Anemia Hold cyanocobalamin.  Resume at discharge.  Hemoglobin at baseline for him.  Atrial fibrillation Rate controlled.  Continue anticoagulation on Coumadin.  Chronic kidney disease stage II Creatinine at baseline.  Continue to monitor.  Chronic low back pain Stable.  Hyperlipidemia Intolerance to statin.  Recent history of questionable esophageal dysphasia Initially noted in September of 2013.  Has seen GI, [name].  Had a modified barium swallow eval performed on 04/13/2012, no aspiration or penetration was observed, recommended regular diet with thin liquids.  Prophylaxis Therapeutic on INR.  Continue Coumadin.  CODE STATUS Full code.  Patient undecided about his CODE STATUS as a result will have the patient full code.  Disposition Admit the patient to telemetry as observation.  Consider discharge tomorrow if blood pressure improved after physical therapy.   Time spent on admission, talking to the patient, and coordinating care was: 60 mins.  Raider Valbuena A, MD 04/14/2012, 5:15 PM

## 2012-04-14 NOTE — ED Provider Notes (Signed)
History     CSN: [id number]  Arrival date & time 04/14/12  1319   First MD Initiated Contact with Patient 04/14/12 1331      Chief Complaint  Patient presents with  . generalized weakness     (Consider location/radiation/quality/duration/timing/severity/associated sxs/prior treatment) HPI Comments: Patient with extensive, complicated past medical history as listed -- presents to emergency department from his primary care physician with a three-day history of generalized weakness, disequilibrium, dizziness. No stroke symptoms, chest pain. Was seen by his primary care physician today and was found to be hypotensive into the 80s systolic. He appears clinically dehydrated. Was sent to the emergency department for further evaluation. Patient denies symptoms of infection including fever, upper respiratory tract infection symptoms, cough, urinary symptoms, abdominal pain. He states that he feels dizzy with ambulation which is described as feeling unsteady on his feet and some lightheadedness. He had diarrhea yesterday which was improved with Imodium. Patient denies black stools or blood in stool. No vertigo or full syncope. No skin changes or rashes. Onset gradual. Course is gradually worsening. Nothing makes symptoms better or worse.  The history is provided by the patient.    Past Medical History  Diagnosis Date  . CAD (coronary artery disease)     post CABG with prior percutaneous intervention of the saphenous vein graft with known saphenous vein graft disease.  Marland Kitchen AAA (abdominal aortic aneurysm)     a. CTA 09/2011: 4.1-4.2cm infrarenal AAA.  Marland Kitchen Aneurysm of thoracic aorta 1995    a. Rupture 1995 w/spontaneous resolution;  b. CTA 09/2011: 6.8cm dist, 5.7cm mid  . Popliteal artery aneurysm   . Myocardial infarction   . Ischemic cardiomyopathy   . Paroxysmal atrial fibrillation   . Hypertension   . Peripheral vascular disease   . Dyslipidemia   . Diabetes mellitus 2007    dx'd 2007  w/persisting FBS>125  . GERD (gastroesophageal reflux disease)   . Hiatal hernia   . Diverticular disease   . BPH (benign prostatic hypertrophy)     with hx of acute urinary retention (pt can self-cath)  . COPD (chronic obstructive pulmonary disease)     05/09/08 PFT FEV1 1.84 (55%), FVC 4.42 (95%), TLC 6.46 (92%), DLCO 72%, no BD response  . Pneumonia 1/09  . Allergic rhinitis   . Shingles     X 2 episodes, both in left scapula area  . OSA (obstructive sleep apnea) 06/14/09    PSG RDI 17, PLMI 96, intolerant of CPAP or BPAP  . Chronic renal insufficiency     Baseline Cr 1.5  . Prostate cancer 07/2008    Low grade; watchful waiting with [name]  . Pulmonary nodule, left 03/02/2010    Left base; resolved on f/u CT  . Allergic rhinitis   . Carpal tunnel syndrome of left wrist 02/11/2011  . IMPLANTATION OF DEFIBRILLATOR, HX OF 09/02/2008    Medtronic 7232Cx Maximo V. Had a 6949 lead - failed and therapies turned off.  . IRON DEFICIENCY 06/08/2010    Hemoccult negative:  GI ([name]) recommended no invasive diagnostics--watchful waiting (2012).  . UNSPECIFIED ANEMIA 06/01/2010    Multifactorial: iron def, CRI, vit B12 def,   . Hypotension 04/14/2012    Past Surgical History  Procedure Date  . Cardiac defibrillator placement 08/28/2004    [name]  . Colon surgery 1995    partial colectomy  . Coronary artery bypass graft 1996  . Aorta - bilateral femoral artery bypass graft 1999  [name] Early  . Nose surgery   . Insertion of pacing lead     New rate sensing pacing lead with removal of a previous implanted ICD and insertion of a device back in the pocket with defibrillation threshold testing  . Abdominal aortic aneurysm repair 1999    Ingrarenal repair/ graft  . Stents     Placed to the saphenous vein graft  . Radiofrequency ablation 2006    A flutter    Family History  Problem Relation Age of Onset  . Stroke Mother 34  . Heart attack Mother     CVA   . Cancer Father 56    Lung  . Heart attack Father 63  . Lung cancer Father   . Coronary artery disease Other     2 of 5 siblings with CAD    History  Substance Use Topics  . Smoking status: Former Smoker -- 1.0 packs/day for 30 years    Types: Cigarettes    Quit date: 05/23/2003  . Smokeless tobacco: Former Neurosurgeon    Types: Chew    Quit date: 05/14/2003     Comment: 80 pack a year history  . Alcohol Use: 0.6 oz/week    1 Cans of beer per week     Comment: only occ      Review of Systems  Constitutional: Negative for fever.  HENT: Negative for sore throat and rhinorrhea.   Eyes: Negative for redness.  Respiratory: Negative for cough.   Cardiovascular: Negative for chest pain and leg swelling.  Gastrointestinal: Positive for nausea and diarrhea. Negative for vomiting and abdominal pain.  Genitourinary: Negative for dysuria.  Musculoskeletal: Positive for arthralgias (baseline). Negative for myalgias.  Skin: Negative for rash.  Neurological: Positive for dizziness, weakness and light-headedness. Negative for headaches.    Allergies  Atorvastatin; Crestor; Lisinopril; Morphine; Prednisone; and Zolpidem tartrate  Home Medications   Current Outpatient Rx  Name  Route  Sig  Dispense  Refill  . ACETAMINOPHEN 325 MG PO TABS   Oral   Take 325 mg by mouth every 6 (six) hours as needed. For pain         . ALBUTEROL SULFATE (2.5 MG/3ML) 0.083% IN NEBU   Nebulization   Take 2.5 mg by nebulization every 6 (six) hours as needed. For shortness of breath         . AMIODARONE HCL 200 MG PO TABS   Oral   Take 200 mg by mouth daily.          . ASPIRIN 81 MG PO TABS   Oral   Take 81 mg by mouth daily.           . BUDESONIDE 0.25 MG/2ML IN SUSP   Nebulization   Take 2 mLs (0.25 mg total) by nebulization 2 (two) times daily.   60 mL   5   . BUDESONIDE 32 MCG/ACT NA SUSP   Nasal   Place 2 sprays into the nose daily.   1 Bottle   12   . CARVEDILOL 3.125 MG PO  TABS   Oral   Take 1 tablet (3.125 mg total) by mouth 2 (two) times daily.   60 tablet   3     Dose decrease   . CETIRIZINE HCL 10 MG PO TABS   Oral   Take 10 mg by mouth daily.           Marland Kitchen CLOTRIMAZOLE-BETAMETHASONE 1-0.05 % EX CREA   Topical   Apply  1 application topically 2 (two) times daily as needed. For skin irritation         . CYANOCOBALAMIN 1000 MCG/ML IJ SOLN   Intramuscular   Inject into the muscle every 30 (thirty) days.           . DESOXIMETASONE 0.25 % EX CREA   Topical   Apply 1 application topically 2 (two) times daily as needed. For rash         . FINASTERIDE 5 MG PO TABS   Oral   Take 5 mg by mouth daily.           . GUAIFENESIN ER 600 MG PO TB12   Oral   Take 1,200 mg by mouth 2 (two) times daily as needed. For congestion         . GUAIFENESIN-DM 100-10 MG/5ML PO SYRP   Oral   Take 5 mLs by mouth 3 (three) times daily as needed. As needed for cough         . IPRATROPIUM BROMIDE 0.03 % NA SOLN      Two sprays three times a day as needed before meals         . ISOSORBIDE MONONITRATE ER 60 MG PO TB24   Oral   Take 1 tablet (60 mg total) by mouth daily.   1 tablet   0   . LEVALBUTEROL TARTRATE 45 MCG/ACT IN AERO   Inhalation   Inhale 1-2 puffs into the lungs every 4 (four) hours as needed. For shortness of breath   1 Inhaler   6   . NITROGLYCERIN 0.3 MG SL SUBL   Sublingual   Place 0.3 mg under the tongue every 5 (five) minutes as needed. For chest pain         . OLMESARTAN MEDOXOMIL 5 MG PO TABS   Oral   Take 1 tablet (5 mg total) by mouth daily.   30 tablet   5   . OMEPRAZOLE MAGNESIUM 20 MG PO TBEC   Oral   Take 1 tablet (20 mg total) by mouth 2 (two) times daily.   60 tablet   0   . SITAGLIPTIN PHOSPHATE 100 MG PO TABS   Oral   Take 1 tablet (100 mg total) by mouth daily.   30 tablet   5   . SPIRIVA HANDIHALER 18 MCG IN CAPS      PLACE 1 CAPSULE (18 MCG TOTAL) INTO INHALER AND INHALE DAILY.   30 each    5   . TAMSULOSIN HCL 0.4 MG PO CAPS   Oral   Take 0.4 mg by mouth daily.           . WARFARIN SODIUM 5 MG PO TABS   Oral   Take 2.5-5 mg by mouth daily. Take 1/2 TAB DAILY except on Friday then take 1 tab           BP 80/46  Pulse 107  Temp 97.6 F (36.4 C) (Oral)  Resp 20  SpO2 97%  Physical Exam  Nursing note and vitals reviewed. Constitutional: He appears well-developed and well-nourished.       Mild pallor  HENT:  Head: Normocephalic and atraumatic.  Mouth/Throat: Uvula is midline and oropharynx is clear and moist. Mucous membranes are dry.  Eyes: Conjunctivae normal are normal. Pupils are equal, round, and reactive to light. Right eye exhibits no discharge. Left eye exhibits no discharge.  Neck: Normal range of motion. Neck supple. No JVD present.  Cardiovascular: Normal  rate, regular rhythm and normal heart sounds.   Pulmonary/Chest: Effort normal and breath sounds normal. No respiratory distress. He has no wheezes. He has no rales. He exhibits no tenderness.  Abdominal: Soft. He exhibits no distension. There is no tenderness. There is no rebound and no guarding.  Neurological: He is alert. He has normal strength. No cranial nerve deficit or sensory deficit. Coordination normal. GCS eye subscore is 4. GCS verbal subscore is 5. GCS motor subscore is 6.       No truncal ataxia.   Skin: Skin is warm and dry.  Psychiatric: He has a normal mood and affect.    ED Course  Procedures (including critical care time)  Labs Reviewed  CBC WITH DIFFERENTIAL - Abnormal; Notable for the following:    RBC 3.88 (*)     Hemoglobin 11.3 (*)     HCT 36.2 (*)     Platelets 147 (*)     All other components within normal limits  COMPREHENSIVE METABOLIC PANEL - Abnormal; Notable for the following:    Glucose, Bld 109 (*)     Creatinine, Ser 1.41 (*)     Total Protein 5.8 (*)     Albumin 3.3 (*)     GFR calc non Af Amer 48 (*)     GFR calc Af Amer 55 (*)     All other components  within normal limits  PROTIME-INR - Abnormal; Notable for the following:    Prothrombin Time 27.3 (*)     INR 2.69 (*)     All other components within normal limits  URINALYSIS, ROUTINE W REFLEX MICROSCOPIC  CG4 I-STAT (LACTIC ACID)  POCT I-STAT TROPONIN I     1. Dehydration   2. Generalized weakness   3. CAP (community acquired pneumonia)     1:45 PM Patient seen and examined. Work-up initiated. Medications ordered.   Vital signs reviewed and are as follows: Filed Vitals:   04/14/12 1340  BP: 80/46  Pulse:   Temp:   Resp:   BP 80/46  Pulse 107  Temp 97.6 F (36.4 C) (Oral)  Resp 20  SpO2 97%  4:33 PM X-ray reviewed by myself. Radiologist report reviewed. ? Early infiltrate vs atelectasis.  Will treat as another source of generalized weakness is not seen.   Hypotension improving with bolus.   Dr. Rhunette Croft has seen. Admit to [facility] for constellation of symptoms.     Date: 04/14/2012  Rate: 96  Rhythm: atrial fibrillation  QRS Axis: left  Intervals: normal  ST/T Wave abnormalities: nonspecific T wave changes  Conduction Disutrbances:left anterior fascicular block  Narrative Interpretation:   Old EKG Reviewed: unchanged from 03/31/2012      MDM  Admit for dehydration, weakness in complex patient. Also possible early PNA. Admit obs.         Renne Crigler, [place] 04/14/12 [phone]

## 2012-04-14 NOTE — Assessment & Plan Note (Addendum)
EKG done today, rate controlled on Amiodarone and coumadin

## 2012-04-14 NOTE — Progress Notes (Signed)
Patient ID: Roberto [name], male   DOB: 07/15/1935, 76 y.o.   MRN: [id number] MORDECAI TINDOL [id number] 1935/12/12 04/14/2012      Progress Note-Follow Up  Subjective  Chief Complaint  Chief Complaint  Patient presents with  . Back Pain    upper and lower- upper worse this morning  . Shortness of Breath    worse today    HPI  Patient is a 76 year old Caucasian male who was a patient of Dr. Marvel Plan. He has not been feeling well for about 4 days. He says the symptoms started on Saturday with a sense of weakness and unsteady gait. He was using a cane for the first time to study his gait [place]. He notes over the last several days he's been sleeping very poorly and having increased back shoulder and neck pain as well. He denies any other signs of obvious acute illness. He denies fevers, chills, congestion, sore throat, cough, GI or GU complaints. He says his been no change in his bowels but no bloody or tarry stool. He does note some increasing dyspnea on exertion but is not striking. Denies chest pressure or palpitations. There have been some difficulties with household maintenance and his wife acknowledges he worries frequently and sleeps poorly when this happens. He underwent a barium swallow yesterday and his gastroenterologist office and actually became dehydrated at that time. This morning drink Coca-Cola. He continues to feel lightheaded and weak. Is very anxious here in the office and is hypotensive. He is morning but acknowledges his appetite is somewhat depressed. Had an episode of diarrhea yesterday but that the dose of Imodium and that resolved. Is complaining of dry mouth as well and is clinically dehydrated in the visit today. Blood sugars have been very slightly hi for him in the 130s or 140s.  Past Medical History  Diagnosis Date  . CAD (coronary artery disease)     post CABG with prior percutaneous intervention of the saphenous vein graft with  known saphenous vein graft disease.  Marland Kitchen AAA (abdominal aortic aneurysm)     a. CTA 09/2011: 4.1-4.2cm infrarenal AAA.  Marland Kitchen Aneurysm of thoracic aorta 1995    a. Rupture 1995 w/spontaneous resolution;  b. CTA 09/2011: 6.8cm dist, 5.7cm mid  . Popliteal artery aneurysm   . Myocardial infarction   . Ischemic cardiomyopathy   . Paroxysmal atrial fibrillation   . Hypertension   . Peripheral vascular disease   . Dyslipidemia   . Diabetes mellitus 2007    dx'd 2007 w/persisting FBS>125  . GERD (gastroesophageal reflux disease)   . Hiatal hernia   . Diverticular disease   . BPH (benign prostatic hypertrophy)     with hx of acute urinary retention (pt can self-cath)  . COPD (chronic obstructive pulmonary disease)     05/09/08 PFT FEV1 1.84 (55%), FVC 4.42 (95%), TLC 6.46 (92%), DLCO 72%, no BD response  . Pneumonia 1/09  . Allergic rhinitis   . Shingles     X 2 episodes, both in left scapula area  . OSA (obstructive sleep apnea) 06/14/09    PSG RDI 17, PLMI 96, intolerant of CPAP or BPAP  . Chronic renal insufficiency     Baseline Cr 1.5  . Prostate cancer 07/2008    Low grade; watchful waiting with [name]  . Pulmonary nodule, left 03/02/2010    Left base; resolved on f/u CT  . Allergic rhinitis   . Carpal tunnel syndrome  of left wrist 02/11/2011  . IMPLANTATION OF DEFIBRILLATOR, HX OF 09/02/2008    Medtronic 7232Cx Maximo V. Had a 6949 lead - failed and therapies turned off.  . IRON DEFICIENCY 06/08/2010    Hemoccult negative:  GI ([name]) recommended no invasive diagnostics--watchful waiting (2012).  . UNSPECIFIED ANEMIA 06/01/2010    Multifactorial: iron def, CRI, vit B12 def,   . Hypotension 04/14/2012    Past Surgical History  Procedure Date  . Cardiac defibrillator placement 08/28/2004    [name]  . Colon surgery 1995    partial colectomy  . Coronary artery bypass graft 1996  . Aorta - bilateral femoral artery bypass graft 1999    [name] Early  . Nose  surgery   . Insertion of pacing lead     New rate sensing pacing lead with removal of a previous implanted ICD and insertion of a device back in the pocket with defibrillation threshold testing  . Abdominal aortic aneurysm repair 1999    Ingrarenal repair/ graft  . Stents     Placed to the saphenous vein graft  . Radiofrequency ablation 2006    A flutter    Family History  Problem Relation Age of Onset  . Stroke Mother 7  . Heart attack Mother     CVA  . Cancer Father 43    Lung  . Heart attack Father 74  . Lung cancer Father   . Coronary artery disease Other     2 of 5 siblings with CAD    History   Social History  . Marital Status: Married    Spouse Name: N/A    Number of Children: N/A  . Years of Education: N/A   Occupational History  . RETIRED     Mechanic   Social History Main Topics  . Smoking status: Former Smoker -- 1.0 packs/day for 30 years    Types: Cigarettes    Quit date: 05/23/2003  . Smokeless tobacco: Former Neurosurgeon    Types: Chew    Quit date: 05/14/2003     Comment: 80 pack a year history  . Alcohol Use: 0.6 oz/week    1 Cans of beer per week     Comment: only occ  . Drug Use: No  . Sexually Active: Not on file   Other Topics Concern  . Not on file   Social History Narrative   Lives in [place] with his wife.Very involved in his church.Enjoys bluegrass music, going to [employer], spending time with extended family.Used to play golf twice a week up until 2012.No alc/drug use.Former smoker, quit 2005.    Current Outpatient Prescriptions on File Prior to Visit  Medication Sig Dispense Refill  . acetaminophen (TYLENOL) 325 MG tablet Take 325 mg by mouth every 6 (six) hours as needed. For pain      . albuterol (PROVENTIL) (2.5 MG/3ML) 0.083% nebulizer solution Take 2.5 mg by nebulization every 6 (six) hours as needed. For shortness of breath      . amiodarone (PACERONE) 200 MG tablet Take 200 mg by mouth daily.       Marland Kitchen aspirin 81 MG tablet  Take 81 mg by mouth daily.        . budesonide (PULMICORT) 0.25 MG/2ML nebulizer solution Take 2 mLs (0.25 mg total) by nebulization 2 (two) times daily.  60 mL  5  . budesonide (RHINOCORT AQUA) 32 MCG/ACT nasal spray Place 2 sprays into the nose daily.  1 Bottle  12  .  carvedilol (COREG) 3.125 MG tablet Take 1 tablet (3.125 mg total) by mouth 2 (two) times daily.  60 tablet  3  . cetirizine (ZYRTEC) 10 MG tablet Take 10 mg by mouth daily.        . clotrimazole-betamethasone (LOTRISONE) cream Apply 1 application topically 2 (two) times daily as needed. For skin irritation      . cyanocobalamin 1000 MCG/ML injection Inject into the muscle every 30 (thirty) days.        Marland Kitchen desoximetasone (TOPICORT) 0.25 % cream Apply 1 application topically 2 (two) times daily as needed. For rash      . finasteride (PROSCAR) 5 MG tablet Take 5 mg by mouth daily.        . furosemide (LASIX) 40 MG tablet TAKE 1 TABLET BY MOUTH EVERY DAY  30 tablet  3  . guaiFENesin (MUCINEX) 600 MG 12 hr tablet Take 1,200 mg by mouth 2 (two) times daily as needed. For congestion      . guaiFENesin-dextromethorphan (ROBITUSSIN DM) 100-10 MG/5ML syrup Take 5 mLs by mouth 3 (three) times daily as needed. As needed for cough      . ipratropium (ATROVENT) 0.03 % nasal spray Two sprays three times a day as needed before meals      . isosorbide mononitrate (IMDUR) 60 MG 24 hr tablet Take 1 tablet (60 mg total) by mouth daily.  1 tablet  0  . KLOR-CON 10 10 MEQ tablet TAKE 1 TABLET BY MOUTH EVERY DAY  30 tablet  6  . levalbuterol (XOPENEX HFA) 45 MCG/ACT inhaler Inhale 1-2 puffs into the lungs every 4 (four) hours as needed. For shortness of breath  1 Inhaler  6  . levothyroxine (SYNTHROID, LEVOTHROID) 125 MCG tablet ALTERNATING TAKING 1 TABLET DAILY WITH 1/2 TABLET DAILY.  23 tablet  5  . lithium carbonate (LITHOBID) 300 MG CR tablet TAKE 1 TABLET (300 MG TOTAL) BY MOUTH 2 (TWO) TIMES DAILY.  60 tablet  6  . nitroGLYCERIN (NITROSTAT) 0.3 MG SL  tablet Place 0.3 mg under the tongue every 5 (five) minutes as needed. For chest pain      . NON FORMULARY 2 Act daily. Flutter Valve.       . NON FORMULARY as directed. Home nebulizer.       Marland Kitchen olmesartan (BENICAR) 5 MG tablet Take 1 tablet (5 mg total) by mouth daily.  30 tablet  5  . omeprazole (PRILOSEC OTC) 20 MG tablet Take 1 tablet (20 mg total) by mouth 2 (two) times daily.  60 tablet  0  . sitaGLIPtin (JANUVIA) 100 MG tablet Take 1 tablet (100 mg total) by mouth daily.  30 tablet  5  . SPIRIVA HANDIHALER 18 MCG inhalation capsule PLACE 1 CAPSULE (18 MCG TOTAL) INTO INHALER AND INHALE DAILY.  30 each  5  . Tamsulosin HCl (FLOMAX) 0.4 MG CAPS Take 0.4 mg by mouth daily.        Marland Kitchen warfarin (COUMADIN) 5 MG tablet Take 2.5-5 mg by mouth daily. Take 1/2 TAB DAILY except on Friday then take 1 tab        Allergies  Allergen Reactions  . Atorvastatin     REACTION: muscle ache in legs  . Crestor (Rosuvastatin Calcium) Other (See Comments)    Lower ext fatigue and soreness- if taken everyday  . Lisinopril Diarrhea  . Morphine     REACTION: AMS - agitation  . Prednisone     REACTION: Can't breath  . Zolpidem Tartrate  REACTION: AMS    Review of Systems  Review of Systems  Constitutional: Positive for malaise/fatigue. Negative for fever and diaphoresis.  HENT: Positive for congestion and neck pain.   Eyes: Negative for discharge.  Respiratory: Positive for shortness of breath. Negative for cough and sputum production.   Cardiovascular: Negative for chest pain, palpitations and leg swelling.  Gastrointestinal: Positive for heartburn and nausea. Negative for abdominal pain, diarrhea, constipation, blood in stool and melena.  Genitourinary: Positive for frequency. Negative for dysuria, urgency and hematuria.  Musculoskeletal: Positive for myalgias and back pain. Negative for falls.  Skin: Negative for rash.  Neurological: Positive for weakness and headaches. Negative for loss of  consciousness.  Endo/Heme/Allergies: Negative for polydipsia.  Psychiatric/Behavioral: Negative for depression and suicidal ideas. The patient is not nervous/anxious and does not have insomnia.     Objective  BP 84/58  Pulse 65  Temp 99 F (37.2 C) (Temporal)  Ht 5' 10.5" (1.791 m)  Wt 171 lb (77.565 kg)  BMI 24.19 kg/m2  SpO2 97%  Physical Exam  Physical Exam  Constitutional: He is oriented to person, place, and time and well-developed, well-nourished, and in no distress. No distress.  HENT:  Head: Normocephalic and atraumatic.       Dry mucus membranes  Eyes: Conjunctivae normal are normal.  Neck: Neck supple. No thyromegaly present.  Cardiovascular: Normal rate and normal heart sounds.   No murmur heard.      Pacer in left upper chest wall, irregularly irregular  Pulmonary/Chest: Effort normal and breath sounds normal. No respiratory distress.  Abdominal: He exhibits no distension and no mass. There is no tenderness.  Musculoskeletal: He exhibits no edema.  Neurological: He is alert and oriented to person, place, and time.  Skin: Skin is warm.  Psychiatric: Memory, affect and judgment normal.    Lab Results  Component Value Date   TSH 0.53 03/16/2012   Lab Results  Component Value Date   WBC 7.2 03/16/2012   HGB 12.4* 03/16/2012   HCT 38.8* 03/16/2012   MCV 92.3 03/16/2012   PLT 141.0* 03/16/2012   Lab Results  Component Value Date   CREATININE 1.3 03/16/2012   BUN 22 03/16/2012   NA 139 03/16/2012   K 4.1 03/16/2012   CL 103 03/16/2012   CO2 29 03/16/2012   Lab Results  Component Value Date   ALT 18 03/16/2012   AST 18 03/16/2012   ALKPHOS 59 03/16/2012   BILITOT 0.6 03/16/2012   Lab Results  Component Value Date   CHOL 166 05/17/2010   Lab Results  Component Value Date   HDL 46.10 05/17/2010   Lab Results  Component Value Date   LDLCALC 101* 05/17/2010   Lab Results  Component Value Date   TRIG 95.0 05/17/2010   Lab Results  Component Value Date    CHOLHDL 4 05/17/2010     Assessment & Plan  Hypotension Patient is in today for evaluation of 4 days worth of worsening symptoms. Has a concerning a very complicated past medical history in the past 4 days he began to struggle with increased fatigue, weakness and unsteady gait. He notes over the last several days she's had increasing shortness of breath with even minimal exertion he also has ongoing back pain and neck pain. Technologist she's not been sleeping well and has also been very anxious. Yesterday underwent a barium swallow with gastroenterology as well. Throughout the day he drinks very few fluids in today only drunk:. At this point  he is dehydrated and hypotensive feels weak and is very anxious. The decision was made to transport him to the emergency room for more thorough evaluation given his complicated past medical history. He is likely need of IV fluids for resuscitation and electrolyte evaluation. EMS did call because his wife unable to drive him to the emergency room.  Atrial fibrillation EKG done today, rate controlled on Amiodarone and coumadin  B12 deficiency Anemia noted and patient maintained on Comadin. No change in bowel habits or dark stool noted per patient  DIABETES MELLITUS, TYPE II Generally well controlled, running in the 130s and 140s lately

## 2012-04-14 NOTE — Assessment & Plan Note (Signed)
Patient is in today for evaluation of 4 days worth of worsening symptoms. Has a concerning a very complicated past medical history in the past 4 days he began to struggle with increased fatigue, weakness and unsteady gait. He notes over the last several days she's had increasing shortness of breath with even minimal exertion he also has ongoing back pain and neck pain. Technologist she's not been sleeping well and has also been very anxious. Yesterday underwent a barium swallow with gastroenterology as well. Throughout the day he drinks very few fluids in today only drunk:. At this point he is dehydrated and hypotensive feels weak and is very anxious. The decision was made to transport him to the emergency room for more thorough evaluation given his complicated past medical history. He is likely need of IV fluids for resuscitation and electrolyte evaluation. EMS did call because his wife unable to drive him to the emergency room.

## 2012-04-14 NOTE — ED Notes (Signed)
Results of lactic acid shown to Dr. Rhunette Croft

## 2012-04-14 NOTE — Progress Notes (Signed)
1840 L  Shoulder pressure resolved immediately  By rest. EKG done

## 2012-04-14 NOTE — Assessment & Plan Note (Signed)
Anemia noted and patient maintained on Comadin. No change in bowel habits or dark stool noted per patient

## 2012-04-14 NOTE — Assessment & Plan Note (Signed)
Generally well controlled, running in the 130s and 140s lately

## 2012-04-14 NOTE — ED Notes (Signed)
Per ems- pt comes from [place] office where he went today c/o generalized weakness x 3 days. Reporting diarrhea yesterday, diminished appetite. BP at office 84/56. EKG AFIB which is hx. C/o back/shoulder pain, "pressure" in upper left chest.CBG 105, EMS BP 106/70. Pt a x 4.

## 2012-04-15 LAB — CBC
Hemoglobin: 11.4 g/dL — ABNORMAL LOW (ref 13.0–17.0)
MCHC: 31.8 g/dL (ref 30.0–36.0)
RDW: 14.3 % (ref 11.5–15.5)

## 2012-04-15 LAB — BASIC METABOLIC PANEL
BUN: 17 mg/dL (ref 6–23)
Creatinine, Ser: 1.22 mg/dL (ref 0.50–1.35)
GFR calc Af Amer: 66 mL/min — ABNORMAL LOW (ref >90)
GFR calc non Af Amer: 57 mL/min — ABNORMAL LOW (ref >90)
Potassium: 3.7 mEq/L (ref 3.5–5.1)

## 2012-04-15 LAB — PROTIME-INR
INR: 2.65 — ABNORMAL HIGH (ref 0.00–1.49)
Prothrombin Time: 27 seconds — ABNORMAL HIGH (ref 11.6–15.2)

## 2012-04-15 MED ORDER — FUROSEMIDE 40 MG PO TABS: 40.0000 mg | ORAL_TABLET | Freq: Every day | ORAL | Status: DC

## 2012-04-15 MED ORDER — LEVOFLOXACIN 500 MG PO TABS: 500.0000 mg | ORAL_TABLET | ORAL | Status: DC

## 2012-04-15 NOTE — Progress Notes (Signed)
ANTICOAGULATION CONSULT NOTE - Initial Consult  Pharmacy Consult for Levaquin and warfarin Indication: Bronchitis and Afib  Allergies  Allergen Reactions  . Atorvastatin     REACTION: muscle ache in legs  . Crestor (Rosuvastatin Calcium) Other (See Comments)    Lower ext fatigue and soreness- if taken everyday  . Lisinopril Diarrhea  . Morphine     REACTION: AMS - agitation  . Prednisone     REACTION: Can't breath  . Zolpidem Tartrate     REACTION: AMS    Patient Measurements: Height: 5\' 11"  (180.3 cm) Weight: 76.1 kg IBW/kg (Calculated) : 75.3 kg  Vital Signs: Temp: 97.9 F (36.6 C) (12/04 0606) Temp src: Oral (12/04 0606) BP: 117/67 mmHg (12/04 0611) Pulse Rate: 66  (12/04 0611)  Labs:  Basename 04/15/12 0905 04/15/12 0555 04/14/12 2224 04/14/12 1750 04/14/12 1419  HGB -- 11.4* -- -- 11.3*  HCT -- 35.8* -- -- 36.2*  PLT -- 140* -- -- 147*  APTT -- -- -- -- --  LABPROT 27.0* -- -- -- 27.3*  INR 2.65* -- -- -- 2.69*  HEPARINUNFRC -- -- -- -- --  CREATININE -- 1.22 -- -- 1.41*  CKTOTAL -- -- -- -- --  CKMB -- -- -- -- --  TROPONINI -- <0.30 <0.30 <0.30 --    Estimated Creatinine Clearance: 56.6 ml/min (by C-G formula based on Cr of 1.22).   Medications:  Prescriptions prior to admission  Medication Sig Dispense Refill  . acetaminophen (TYLENOL) 325 MG tablet Take 325 mg by mouth every 6 (six) hours as needed. For pain      . albuterol (PROVENTIL) (2.5 MG/3ML) 0.083% nebulizer solution Take 2.5 mg by nebulization every 6 (six) hours as needed. For shortness of breath      . amiodarone (PACERONE) 200 MG tablet Take 200 mg by mouth daily.       Marland Kitchen aspirin 81 MG tablet Take 81 mg by mouth daily.        . budesonide (PULMICORT) 0.25 MG/2ML nebulizer solution Take 2 mLs (0.25 mg total) by nebulization 2 (two) times daily.  60 mL  5  . budesonide (RHINOCORT AQUA) 32 MCG/ACT nasal spray Place 2 sprays into the nose daily.  1 Bottle  12  . carvedilol (COREG) 3.125 MG  tablet Take 1 tablet (3.125 mg total) by mouth 2 (two) times daily.  60 tablet  3  . cetirizine (ZYRTEC) 10 MG tablet Take 10 mg by mouth daily.        . clotrimazole-betamethasone (LOTRISONE) cream Apply 1 application topically 2 (two) times daily as needed. For skin irritation      . cyanocobalamin 1000 MCG/ML injection Inject into the muscle every 30 (thirty) days.        Marland Kitchen desoximetasone (TOPICORT) 0.25 % cream Apply 1 application topically 2 (two) times daily as needed. For rash      . finasteride (PROSCAR) 5 MG tablet Take 5 mg by mouth daily.        . furosemide (LASIX) 40 MG tablet Take 40 mg by mouth daily.      Marland Kitchen guaiFENesin (MUCINEX) 600 MG 12 hr tablet Take 1,200 mg by mouth 2 (two) times daily as needed. For congestion      . guaiFENesin-dextromethorphan (ROBITUSSIN DM) 100-10 MG/5ML syrup Take 5 mLs by mouth 3 (three) times daily as needed. As needed for cough      . ipratropium (ATROVENT) 0.03 % nasal spray Two sprays three times a day as needed  before meals      . isosorbide mononitrate (IMDUR) 60 MG 24 hr tablet Take 1 tablet (60 mg total) by mouth daily.  1 tablet  0  . levalbuterol (XOPENEX HFA) 45 MCG/ACT inhaler Inhale 1-2 puffs into the lungs every 4 (four) hours as needed. For shortness of breath  1 Inhaler  6  . levothyroxine (SYNTHROID, LEVOTHROID) 125 MCG tablet Take 125 mcg by mouth daily.      Marland Kitchen lithium carbonate 300 MG capsule Take 300 mg by mouth 2 (two) times daily with a meal.      . nitroGLYCERIN (NITROSTAT) 0.3 MG SL tablet Place 0.3 mg under the tongue every 5 (five) minutes as needed. For chest pain      . olmesartan (BENICAR) 5 MG tablet Take 1 tablet (5 mg total) by mouth daily.  30 tablet  5  . omeprazole (PRILOSEC OTC) 20 MG tablet Take 1 tablet (20 mg total) by mouth 2 (two) times daily.  60 tablet  0  . potassium chloride (K-DUR) 10 MEQ tablet Take 10 mEq by mouth daily.      . sitaGLIPtin (JANUVIA) 100 MG tablet Take 1 tablet (100 mg total) by mouth daily.   30 tablet  5  . Tamsulosin HCl (FLOMAX) 0.4 MG CAPS Take 0.4 mg by mouth daily.        Marland Kitchen tiotropium (SPIRIVA) 18 MCG inhalation capsule Place 18 mcg into inhaler and inhale daily.      Marland Kitchen warfarin (COUMADIN) 5 MG tablet Take 2.5-5 mg by mouth daily. Take 1 tab on Monday and Friday. Take 1/2 a tab the rest of the week        Assessment:  Roberto [name] admitted 12/3 with generalized weakness, DOA, SOB, and dizziness. He had seen his PCP and was sent to the hospital due to a blood pressure of 84/58. He has a history of atrial fibrillation for which he is on chronic warfarin. His INR on admission was therapeutic at 2.69 and this morning is is 2.65. CBC is stable (he does have a history of anemia) and no bleeding is noted. His SCr is 1.22 which appears to be around his baseline with a CrCl of ~73mL/min. There are no cultures, but he is being treated for bronchitis with Levaquin for a total of 5 days-planned end date is 12/8.  Goal of Therapy:  INR 2-3 Monitor platelets by anticoagulation protocol: Yes Eradication of infection   Plan:  1. Continue Levaquin 500mg  po q24h for total of 5 days 2. Continue home warfarin regimen- 5mg  on Mondays and Fridays and 2.5mg  all other days 3. Daily PT/INR  Shannan Slinker D. Jamaris Biernat, PharmD Clinical Pharmacist Pager: [phone] 04/15/2012 10:13 AM

## 2012-04-15 NOTE — Evaluation (Signed)
Physical Therapy Evaluation Patient Details Name: Roberto [name] MRN: [id number] DOB: 06/29/1935 Today's Date: 04/15/2012 Time: 0912-0924 PT Time Calculation (min): 12 min  PT Assessment / Plan / Recommendation Clinical Impression  76 y/o male with complicated medical history admitted for generalized weakness and found to be hypotensive and dehydrated for which he was hydrated in the ED. Presents to PT today back to his baseline demonstrating independence with mobility. No further PT needs at this time.     PT Assessment  Patent does not need any further PT services    Follow Up Recommendations  No PT follow up    Does the patient have the potential to tolerate intense rehabilitation      Barriers to Discharge        Equipment Recommendations  None recommended by PT    Recommendations for Other Services     Frequency      Precautions / Restrictions Precautions Precautions: None Restrictions Weight Bearing Restrictions: No   Pertinent Vitals/Pain DOE 1/4 with activity but pt reports this is baseline for him; SpO2 97% with ambulation on RA      Mobility  Bed Mobility Bed Mobility: Not assessed Transfers Transfers: Sit to Stand;Stand to Sit Sit to Stand: 7: Independent;From chair/3-in-1;From bed Stand to Sit: 7: Independent;To chair/3-in-1;To bed Ambulation/Gait Ambulation/Gait Assistance: 7: Independent Ambulation Distance (Feet): 400 Feet Assistive device: None Gait Pattern: Within Functional Limits Stairs: Yes Stairs Assistance: 6: Modified independent (Device/Increase time) Stairs Assistance Details (indicate cue type and reason): pt actually able to walk down 2 steps backward while holding rail with no difficulty; ascended forward Stair Management Technique: One rail Right;Forwards Number of Stairs: 2         Visit Information  Last PT Received On: 04/15/12 Assistance Needed: +1    Subjective Data  Subjective: Im ready to go home! Patient Stated  Goal: home today   Prior Functioning  Home Living Lives With: Spouse Available Help at Discharge: Family;Available PRN/intermittently Type of Home: House Home Access: Stairs to enter Entrance Stairs-Number of Steps: 2 Entrance Stairs-Rails:  (holds onto door) Home Layout: Two level;Able to live on main level with bedroom/bathroom Alternate Level Stairs-Number of Steps: doesn't go up stairs much, when he does he takes it slow and holds onto rail Alternate Level Stairs-Rails: Right Bathroom Shower/Tub: Health visitor: Standard Home Adaptive Equipment: Straight cane Additional Comments: uses cane occasionally depending on how he is feeling for the day Prior Function Level of Independence: Independent Able to Take Stairs?: Yes Driving: Yes Vocation: On disability Communication Communication: No difficulties    Cognition  Overall Cognitive Status: Appears within functional limits for tasks assessed/performed Arousal/Alertness: Awake/alert Orientation Level: Appears intact for tasks assessed Behavior During Session: [facility] for tasks performed    Extremity/Trunk Assessment Right Upper Extremity Assessment RUE ROM/Strength/Tone: Within functional levels Left Upper Extremity Assessment LUE ROM/Strength/Tone: Within functional levels Right Lower Extremity Assessment RLE ROM/Strength/Tone: Within functional levels Left Lower Extremity Assessment LLE ROM/Strength/Tone: Within functional levels Trunk Assessment Trunk Assessment: Kyphotic   Balance Standardized Balance Assessment Standardized Balance Assessment: Dynamic Gait Index Dynamic Gait Index Level Surface: Normal Change in Gait Speed: Normal Gait with Horizontal Head Turns: Mild Impairment Gait with Vertical Head Turns: Mild Impairment Gait and Pivot Turn: Normal Step Over Obstacle: Normal Step Around Obstacles: Normal Steps: Mild Impairment Total Score: 21  High Level Balance High Level Balance Activites:  Sudden stops High Level Balance Comments: no difficulties noted  End of Session PT - End of  Session Equipment Utilized During Treatment: Gait belt Activity Tolerance: Patient tolerated treatment well Patient left: in chair;with call bell/phone within reach (with nutritionist present) Nurse Communication: Mobility status  GP Functional Limitation: Mobility: Walking and moving around Mobility: Walking and Moving Around Current Status (Z6109): 0 percent impaired, limited or restricted Mobility: Walking and Moving Around Goal Status (U0454): 0 percent impaired, limited or restricted Mobility: Walking and Moving Around Discharge Status [phone]): 0 percent impaired, limited or restricted   [facility] HELEN 04/15/2012, 9:33 AM

## 2012-04-15 NOTE — Progress Notes (Signed)
Pt. Received discharge instructions and prescriptions.  Knowledge base ascertained via teach-back method.  Anxious to go home and "get back to living".  Pt. discharged home via private vehicle and accompanied by pastor.  Escorted to exit by RN via wheelchair.

## 2012-04-15 NOTE — Progress Notes (Signed)
Order received, chart reviewed, pt was a one time eval for PT. Spoke to PT and they said that pt could possibly benefit from energy conservation education. Went to patients room and he was all dressed with coat on and ready to go. Did give him the hand out on energy conservation and he was much appreciative. Eval not completed. Acute OT signing off.

## 2012-04-15 NOTE — ED Provider Notes (Signed)
Medical screening examination/treatment/procedure(s) were conducted as a shared visit with non-physician practitioner(s) and myself.  I personally evaluated the patient during the encounter.  Pt sent to the ED with hypotension. Pt has hx of AAA. States that he also feel dizzy. No abd pain/discomfort, no back pain. CXR is equivocal - no fevers, no leukocytosis, but possible infiltrate. BP improved post hydration, but given the AAA, other co morbidities and hypotension at presentation with a possible infiltrate, admitting as Observation admission.  Derwood Kaplan, MD 04/15/12 [phone]

## 2012-04-15 NOTE — Progress Notes (Signed)
Utilization review completed- retro 

## 2012-04-15 NOTE — Progress Notes (Signed)
I Cherrie Gauze, RN's assessment, med administration, notes, I/O, and care plan/education.

## 2012-04-15 NOTE — Progress Notes (Signed)
INITIAL ADULT NUTRITION ASSESSMENT Date: 04/15/2012   Time: 9:55 AM Reason for Assessment: MST (Malnutrition Screening Tool)   INTERVENTION: 1. Continue Glucerna shake as ordered while here 2. Recommend pt continue Glucerna shake BID at home 3. RD will continue to follow    DOCUMENTATION CODES Per approved criteria  -Not Applicable    ASSESSMENT: Male 76 y.o.  Dx: Generalized weakness  Hx:  Past Medical History  Diagnosis Date  . CAD (coronary artery disease)     post CABG with prior percutaneous intervention of the saphenous vein graft with known saphenous vein graft disease.  Marland Kitchen AAA (abdominal aortic aneurysm)     a. CTA 09/2011: 4.1-4.2cm infrarenal AAA.  Marland Kitchen Aneurysm of thoracic aorta 1995    a. Rupture 1995 w/spontaneous resolution;  b. CTA 09/2011: 6.8cm dist, 5.7cm mid  . Popliteal artery aneurysm   . Myocardial infarction   . Ischemic cardiomyopathy   . Paroxysmal atrial fibrillation   . Hypertension   . Peripheral vascular disease   . Dyslipidemia   . Diabetes mellitus 2007    dx'd 2007 w/persisting FBS>125  . GERD (gastroesophageal reflux disease)   . Hiatal hernia   . Diverticular disease   . BPH (benign prostatic hypertrophy)     with hx of acute urinary retention (pt can self-cath)  . COPD (chronic obstructive pulmonary disease)     05/09/08 PFT FEV1 1.84 (55%), FVC 4.42 (95%), TLC 6.46 (92%), DLCO 72%, no BD response  . Pneumonia 1/09  . Allergic rhinitis   . Shingles     X 2 episodes, both in left scapula area  . OSA (obstructive sleep apnea) 06/14/09    PSG RDI 17, PLMI 96, intolerant of CPAP or BPAP  . Chronic renal insufficiency     Baseline Cr 1.5  . Prostate cancer 07/2008    Low grade; watchful waiting with [name]  . Pulmonary nodule, left 03/02/2010    Left base; resolved on f/u CT  . Allergic rhinitis   . Carpal tunnel syndrome of left wrist 02/11/2011  . IMPLANTATION OF DEFIBRILLATOR, HX OF 09/02/2008    Medtronic 7232Cx Maximo V. Had a  6949 lead - failed and therapies turned off.  . IRON DEFICIENCY 06/08/2010    Hemoccult negative:  GI ([name]) recommended no invasive diagnostics--watchful waiting (2012).  . UNSPECIFIED ANEMIA 06/01/2010    Multifactorial: iron def, CRI, vit B12 def,   . Hypotension 04/14/2012  . CHF (congestive heart failure)   . Atrial fibrillation     Past Surgical History  Procedure Date  . Cardiac defibrillator placement 08/28/2004    [name]  . Colon surgery 1995    partial colectomy  . Coronary artery bypass graft 1996  . Aorta - bilateral femoral artery bypass graft 1999    [name] Early  . Nose surgery   . Insertion of pacing lead     New rate sensing pacing lead with removal of a previous implanted ICD and insertion of a device back in the pocket with defibrillation threshold testing  . Abdominal aortic aneurysm repair 1999    Ingrarenal repair/ graft  . Stents     Placed to the saphenous vein graft  . Radiofrequency ablation 2006    A flutter     Related Meds:     . amiodarone  200 mg Oral Daily  . aspirin EC  81 mg Oral Daily  . [COMPLETED] azithromycin  500 mg Oral Once  . budesonide  0.25  mg Nebulization BID  . carvedilol  3.125 mg Oral BID  . [COMPLETED] cefTRIAXone (ROCEPHIN)  IV  1 g Intravenous Once  . feeding supplement  237 mL Oral TID BM  . finasteride  5 mg Oral Daily  . fluticasone  2 spray Each Nare Daily  . insulin aspart  0-5 Units Subcutaneous QHS  . insulin aspart  0-9 Units Subcutaneous TID WC  . isosorbide mononitrate  30 mg Oral Daily  . levofloxacin  500 mg Oral Q24H  . levothyroxine  125 mcg Oral QAC breakfast  . linagliptin  5 mg Oral Daily  . lithium carbonate  300 mg Oral BID WC  . loratadine  10 mg Oral Daily  . pantoprazole  40 mg Oral BID AC  . potassium chloride  10 mEq Oral Daily  . [COMPLETED] sodium chloride  500 mL Intravenous Once  . Tamsulosin HCl  0.4 mg Oral Daily  . tiotropium  18 mcg Inhalation Daily  . warfarin   2.5 mg Oral Custom  . warfarin  5 mg Oral Custom  . Warfarin - Pharmacist Dosing Inpatient   Does not apply q1800  . [DISCONTINUED] aspirin  81 mg Oral Daily  . [DISCONTINUED] isosorbide mononitrate  30 mg Oral Daily  . [DISCONTINUED] omeprazole  20 mg Oral BID  . [DISCONTINUED] warfarin  2.5-5 mg Oral Daily     Ht: 5\' 11"  (180.3 cm)  Wt: 167 lb 12.3 oz (76.1 kg) (scale A)  Ideal Wt: 78.2 kg  % Ideal Wt: 97%  Usual Wt:  Wt Readings from Last 5 Encounters:  04/15/12 167 lb 12.3 oz (76.1 kg)  04/14/12 171 lb (77.565 kg)  04/07/12 165 lb 12.8 oz (75.206 kg)  03/16/12 169 lb (76.658 kg)  02/28/12 171 lb (77.565 kg)   ~163 lbs per pt report  % Usual Wt: 102%  Body mass index is 23.40 kg/(m^2). WNL   Food/Nutrition Related Hx: Pt reports poor appetite, possible weight loss   Labs:  CMP     Component Value Date/Time   NA 141 04/15/2012 0555   K 3.7 04/15/2012 0555   CL 109 04/15/2012 0555   CO2 24 04/15/2012 0555   GLUCOSE 113* 04/15/2012 0555   BUN 17 04/15/2012 0555   CREATININE 1.22 04/15/2012 0555   CREATININE 1.44* 10/09/2011 1529   CALCIUM 9.1 04/15/2012 0555   PROT 5.8* 04/14/2012 1419   ALBUMIN 3.3* 04/14/2012 1419   AST 12 04/14/2012 1419   ALT 13 04/14/2012 1419   ALKPHOS 61 04/14/2012 1419   BILITOT 0.5 04/14/2012 1419   GFRNONAA 57* 04/15/2012 0555   GFRAA 66* 04/15/2012 0555      Intake/Output Summary (Last 24 hours) at 04/15/12 0957 Last data filed at 04/15/12 0631  Gross per 24 hour  Intake    375 ml  Output    425 ml  Net    -50 ml     Diet Order: Carb Control  Supplements/Tube Feeding: Glucerna Shake po TID, each supplement provides 220 kcal and 10 grams of protein.   IVF:    [EXPIRED] sodium chloride Last Rate: 50 mL/hr at 04/14/12 1755  [DISCONTINUED] sodium chloride     Estimated Nutritional Needs:   Kcal: 1800-2000 Protein: 75-85 gm  Fluid: 1.8-2 L   Pt admitted with weakness, likely due to dehydration per notes.  Pt has had poor po  intake at home. Only eating small meals. States he has "lost the taste for food."  Pt had Glucerna Shake in  room, unopened. RD encouraged pt to try shake, willing to drink BID at this time. Also encouraged pt to eat small frequent meals and continue nutrition supplement at home BID. Pt agreeable.  Pt weight is variable with fluid status, unsure if pt has had any weight loss at this time.  RN says pt likely to go home today. Continue Glucerna shake while here, recommend pt continue at home at least BID.   NUTRITION DIAGNOSIS: Inadequate oral intake r/t poor appetite and desire to eat AEB pt reported poor meal completion   MONITORING/EVALUATION(Goals): Goal: PO intake to meet >/=90% estimated nutrition needs  Monitor: PO intake, weight, labs  EDUCATION NEEDS: -No education needs identified at this time    Clarene Duke RD, LDN Pager [phone] After Hours pager [phone]  04/15/2012, 9:55 AM

## 2012-04-16 ENCOUNTER — Encounter (HOSPITAL_COMMUNITY): Payer: Self-pay

## 2012-04-16 ENCOUNTER — Telehealth: Payer: Self-pay

## 2012-04-16 ENCOUNTER — Ambulatory Visit (HOSPITAL_COMMUNITY): Admit: 2012-04-16 | Payer: Self-pay | Admitting: Gastroenterology

## 2012-04-16 SURGERY — ESOPHAGOGASTRODUODENOSCOPY (EGD) WITH ESOPHAGEAL DILATION
Anesthesia: Moderate Sedation

## 2012-04-16 NOTE — Telephone Encounter (Signed)
Pts spouse came into the office today and would like Dr Milinda Cave to review patients xray results from hospital. Pts spouse stated that one doctor said he had pneumonia and put him on an antibiotic, but another doctor came in and said no it wasn't pneumonia. Pts spouse stated that pts dad passed from lung cancer and she wants to make sure its not cancer that there seeing on the xray? Pts spouse was advised that md is not going to be in the office until Monday. Pts spouse voiced that was ok.

## 2012-04-17 ENCOUNTER — Other Ambulatory Visit: Payer: Self-pay

## 2012-04-17 ENCOUNTER — Telehealth: Payer: Self-pay

## 2012-04-17 DIAGNOSIS — R1310 Dysphagia, unspecified: Secondary | ICD-10-CM

## 2012-04-17 NOTE — Telephone Encounter (Signed)
Left a message for pts spouse to return my call.

## 2012-04-17 NOTE — Telephone Encounter (Signed)
Pls notify pt's wife that I reviewed his chest x-ray and his hospital records and I want to reassure her that their is no sign of lung cancer.  The x-ray shows an area of scarring on the bottom part of the left lung.  The area was there on previous x-rays but looked a little worse on this most recent x-ray--commonly leading to suspicion of either early pneumonia OR a little bit of lung not completely filling with air, which commonly happens on and off around these areas of scarring at the bottom parts of the lung.  Either way, with his situation of low blood pressure on admission and such a long list of complex medical problems, it is fairly common for the admitting physician to start antibiotics for possible infection in these circumstances.  In reality, though, it sounds like the antibiotics were more of a precaution in this case, and I don't think the x-ray finding is of any significance and I don't think it has anything to do with his recent low bp/illness/etc.  Reassure, reassure, reassure!--Thank you!

## 2012-04-17 NOTE — Telephone Encounter (Signed)
Pt aware and will call if he doesn't hear from our office regarding coumadin

## 2012-04-20 NOTE — Telephone Encounter (Signed)
Notified wife.

## 2012-04-21 ENCOUNTER — Other Ambulatory Visit: Payer: Self-pay | Admitting: *Deleted

## 2012-04-21 MED ORDER — WARFARIN SODIUM 5 MG PO TABS: ORAL_TABLET | ORAL | Status: DC

## 2012-04-22 ENCOUNTER — Other Ambulatory Visit: Payer: Self-pay | Admitting: *Deleted

## 2012-04-22 NOTE — Telephone Encounter (Signed)
Fax request received for diabetic testing supplies from [facility] Delivered.  I spoke with the patient's wife and she states they did request supplies from this company.  Form is signed and faxed.

## 2012-04-23 NOTE — Discharge Summary (Addendum)
Physician Discharge Summary  Patient ID: Roberto [name] MRN: [id number] DOB/AGE: 1936-01-30 76 y.o.  Admit date: 04/14/2012 Discharge date: 04/15/2012  Primary Care Physician:  Roberto Massed, MD   Discharge Diagnoses:    Principal Problem:  *Generalized weakness Active Problems:  DIABETES MELLITUS, TYPE II  HYPERLIPIDEMIA  HYPERTENSION  CAD, ARTERY BYPASS GRAFT  ISCHEMIC CARDIOMYOPATHY  Atrial fibrillation  PERIPHERAL VASCULAR DISEASE  COPD  HYPOTHYROIDISM  Pernicious anemia  Low back pain  Depression  Coronary artery disease  CKD (chronic kidney disease) stage 2, GFR 60-89 ml/min  Hypotension  Dehydration      Medication List     As of 04/23/2012 10:44 PM    STOP taking these medications         olmesartan 5 MG tablet   Commonly known as: BENICAR      TAKE these medications         acetaminophen 325 MG tablet   Commonly known as: TYLENOL   Take 325 mg by mouth [name] 6 (six) hours as needed. For pain      albuterol (2.5 MG/3ML) 0.083% nebulizer solution   Commonly known as: PROVENTIL   Take 2.5 mg by nebulization [name] 6 (six) hours as needed. For shortness of breath      amiodarone 200 MG tablet   Commonly known as: PACERONE   Take 200 mg by mouth daily.      aspirin 81 MG tablet   Take 81 mg by mouth daily.      budesonide 0.25 MG/2ML nebulizer solution   Commonly known as: PULMICORT   Take 2 mLs (0.25 mg total) by nebulization 2 (two) times daily.      budesonide 32 MCG/ACT nasal spray   Commonly known as: RHINOCORT AQUA   Place 2 sprays into the nose daily.      carvedilol 3.125 MG tablet   Commonly known as: COREG   Take 1 tablet (3.125 mg total) by mouth 2 (two) times daily.      cetirizine 10 MG tablet   Commonly known as: ZYRTEC   Take 10 mg by mouth daily.      clotrimazole-betamethasone cream   Commonly known as: LOTRISONE   Apply 1 application topically 2 (two) times daily as needed. For skin irritation      cyanocobalamin  1000 MCG/ML injection   Commonly known as: (VITAMIN B-12)   Inject into the muscle [name] 30 (thirty) days.      desoximetasone 0.25 % cream   Commonly known as: TOPICORT   Apply 1 application topically 2 (two) times daily as needed. For rash      finasteride 5 MG tablet   Commonly known as: PROSCAR   Take 5 mg by mouth daily.      FLOMAX 0.4 MG Caps   Generic drug: Tamsulosin HCl   Take 0.4 mg by mouth daily.      furosemide 40 MG tablet   Commonly known as: LASIX   Take 1 tablet (40 mg total) by mouth daily. Start tomorrow      guaiFENesin 600 MG 12 hr tablet   Commonly known as: MUCINEX   Take 1,200 mg by mouth 2 (two) times daily as needed. For congestion      guaiFENesin-dextromethorphan 100-10 MG/5ML syrup   Commonly known as: ROBITUSSIN DM   Take 5 mLs by mouth 3 (three) times daily as needed. As needed for cough      ipratropium 0.03 % nasal spray   Commonly known  as: ATROVENT   Two sprays three times a day as needed before meals      isosorbide mononitrate 60 MG 24 hr tablet   Commonly known as: IMDUR   Take 1 tablet (60 mg total) by mouth daily.      levalbuterol 45 MCG/ACT inhaler   Commonly known as: XOPENEX HFA   Inhale 1-2 puffs into the lungs [name] 4 (four) hours as needed. For shortness of breath      levofloxacin 500 MG tablet   Commonly known as: LEVAQUIN   Take 1 tablet (500 mg total) by mouth daily. For 6 days      levothyroxine 125 MCG tablet   Commonly known as: SYNTHROID, LEVOTHROID   Take 125 mcg by mouth daily.      lithium carbonate 300 MG capsule   Take 300 mg by mouth 2 (two) times daily with a meal.      nitroGLYCERIN 0.3 MG SL tablet   Commonly known as: NITROSTAT   Place 0.3 mg under the tongue [name] 5 (five) minutes as needed. For chest pain      omeprazole 20 MG tablet   Commonly known as: PRILOSEC OTC   Take 1 tablet (20 mg total) by mouth 2 (two) times daily.      potassium chloride 10 MEQ tablet   Commonly known as: K-DUR    Take 10 mEq by mouth daily.      sitaGLIPtin 100 MG tablet   Commonly known as: JANUVIA   Take 1 tablet (100 mg total) by mouth daily.      tiotropium 18 MCG inhalation capsule   Commonly known as: SPIRIVA   Place 18 mcg into inhaler and inhale daily.         Disposition and Follow-up:  PCP in 1 week  Significant Diagnostic Studies:  No results found.  Brief H and P: Roberto [name] is a 76 y.o. Caucasian male CAD, AAA, ischemic cardiomyopathy, paroxysmal atrial fibrillation, DM, hyperlipemia, DM, GERD, diverticular disease, BPH, COPD, CKD stage III, chronic systolic congestive heart failure who presents with the above complaints. Patient was recently hospitalized from 01/19/2012 till 01/20/2012 for chest pain and COPD exacerbation. Patient reports that over the last few years he has had dyspnea on exertion. Over the last 3 days he has noted increased weakness and shortness of breath with exertion. He checked his blood pressure over the weekend and noted his blood pressure was 80/53. He went to his primary care physician's office and saw Dr. Abner [name]. His blood pressure at his primary care physician's office was 84/58 as a result he was referred to the emergency department for further evaluation. He also has noted dizziness at times. He denies any recent fevers, chills, nausea or vomiting. Denies any chest pain or shortness of breath. Denies any abdominal pain. Had diarrhea yesterday and has had no further episodes. Has chronic headaches but has improved recently. Denies any vision changes. Reports having chronic nonproductive cough which is unchanged.     Hospital Course:  Generalized weakness   Suspected to be likely due to dehydration. Patient felt  Much improved with hydration only, subsequently felt back to baseline the next day. EK cardiac enzymes, labs unremarkable  Dehydration  Improved with hydration, likely due to diuretics. Patient may have poor by mouth intake at home.  Furosemide held for now, advised to resume tomorrow.  Hypertension with hypotension in the emergency department  Improved with hydration. Furosemide held. ARB held. Continue carvedilol at current dose  with hold parameters. Decreased patient's dose of Imdur from 60 to 30 mg daily. Slowly resumed home antihypertensives except Benicar .  Chronic cough/bronchitis versus early pneumonia  On exam not impressive for pneumonia. Patient afebrile and has no leukocytosis. Will treat empirically for presumed bronchitis with levofloxacin for a total of 5 days.  COPD  Patient compensated at this time. Continue home medications.  Hypothyroidism  Continue home medications.  Coronary artery disease/ischemic cardiomyopathy/chronic systolic congestive heart failure  EF 30-35% racemic 2-D echocardiogram on 11/06/2011. Continue carvedilol. ARB (olemsartan) held due to hypotension.  Diabetes type 2 uncontrolled with complications  Diabetic diet. Sliding scale insulin. Patient on sitagliptin which has been substituted with linagliptin.  Poor by mouth intake/malnutrition  Start Glucerna to supplement his nutrition.  Anemia  Hold cyanocobalamin. Resume at discharge. Hemoglobin at baseline for him.  Atrial fibrillation  Rate controlled. Continue anticoagulation on Coumadin.  Chronic kidney disease stage II  Creatinine at baseline. Continue to monitor.  Chronic low back pain  Stable.  Hyperlipidemia  Intolerance to statin.  Recent history of questionable esophageal dysphasia  Initially noted in September of 2013. Has seen GI, [name]. Had a modified barium swallow eval performed on 04/13/2012, no aspiration or penetration was observed, tolerated regular diet with thin liquids.      Time spent on Discharge:  Signed: Sherwin Hollingshed [facility]  04/23/2012, 10:44 PM

## 2012-04-30 ENCOUNTER — Other Ambulatory Visit: Payer: Self-pay | Admitting: *Deleted

## 2012-04-30 MED ORDER — POTASSIUM CHLORIDE ER 10 MEQ PO TBCR: 10.0000 meq | EXTENDED_RELEASE_TABLET | Freq: Every day | ORAL | Status: DC

## 2012-05-04 ENCOUNTER — Telehealth: Payer: Self-pay | Admitting: Cardiology

## 2012-05-04 MED ORDER — NITROGLYCERIN 0.3 MG SL SUBL: 0.3000 mg | SUBLINGUAL_TABLET | SUBLINGUAL | Status: AC | PRN

## 2012-05-04 NOTE — Telephone Encounter (Signed)
New Problem:    Patient called in needing a refill of his nitroGLYCERIN (NITROSTAT) 0.3 MG SL tablet.

## 2012-05-05 ENCOUNTER — Telehealth: Payer: Self-pay | Admitting: Emergency Medicine

## 2012-05-05 MED ORDER — IPRATROPIUM BROMIDE 0.03 % NA SOLN: NASAL | Status: DC

## 2012-05-05 NOTE — Telephone Encounter (Signed)
Rx sent to [facility]-thx

## 2012-05-11 ENCOUNTER — Telehealth: Payer: Self-pay

## 2012-05-11 NOTE — Telephone Encounter (Signed)
Pt is aware to hold coumadin 5 days prior to appt

## 2012-05-11 NOTE — Telephone Encounter (Signed)
Mr. Bargo should be able to stop his warfarin prior to the procedure. Bonnee Quin ----- Message ----- From: Donata Duff, CMA Sent: 04/17/2012 11:50 AM To: Herby Abraham, MD  Left message on machine to call back

## 2012-05-12 ENCOUNTER — Encounter: Payer: Medicare Other | Admitting: Internal Medicine

## 2012-05-12 ENCOUNTER — Telehealth: Payer: Self-pay | Admitting: Family Medicine

## 2012-05-12 NOTE — Telephone Encounter (Signed)
Noted  

## 2012-05-12 NOTE — Telephone Encounter (Signed)
Patient's spouse stated she had to take Roberto [name] to poison control last night after talking to [facility]. She thinks he took too much lithium carbonate, he also took Amiodarone & Carvedilol. He seems to be doing okay this morning, just a little groggy. His bp last night was 135/71 10:45PM, 135/80 12:45AM, 121/75 this morning. Please contact patient.

## 2012-05-12 NOTE — Telephone Encounter (Signed)
Spoke to pt.  He took Monday and Tuesday night medications last night.  Noticed his mistake and called [facility] and poison control.  He did not go to ED.  He was told to monitor BP, rest and drink lots of fluids.  He was told to take most of his AM meds at 1pm today, others at normal time.  To take PM meds tonight at regular time per cardiology.  Pt is feeling good now.  Pt wanted you to know.

## 2012-05-18 ENCOUNTER — Other Ambulatory Visit: Payer: Self-pay | Admitting: Family Medicine

## 2012-05-21 ENCOUNTER — Ambulatory Visit (INDEPENDENT_AMBULATORY_CARE_PROVIDER_SITE_OTHER): Payer: Medicare Other | Admitting: *Deleted

## 2012-05-21 ENCOUNTER — Encounter: Payer: Self-pay | Admitting: Internal Medicine

## 2012-05-21 ENCOUNTER — Ambulatory Visit (INDEPENDENT_AMBULATORY_CARE_PROVIDER_SITE_OTHER): Payer: Medicare Other | Admitting: Internal Medicine

## 2012-05-21 VITALS — BP 115/66 | HR 58 | Wt 161.0 lb

## 2012-05-21 DIAGNOSIS — T82110A Breakdown (mechanical) of cardiac electrode, initial encounter: Secondary | ICD-10-CM

## 2012-05-21 DIAGNOSIS — I4891 Unspecified atrial fibrillation: Secondary | ICD-10-CM

## 2012-05-21 DIAGNOSIS — Z7901 Long term (current) use of anticoagulants: Secondary | ICD-10-CM

## 2012-05-21 DIAGNOSIS — T82198A Other mechanical complication of other cardiac electronic device, initial encounter: Secondary | ICD-10-CM

## 2012-05-21 DIAGNOSIS — I2510 Atherosclerotic heart disease of native coronary artery without angina pectoris: Secondary | ICD-10-CM

## 2012-05-21 LAB — POCT INR: INR: 3.5

## 2012-05-21 NOTE — Assessment & Plan Note (Signed)
His device remains de-activated at this time. I would not recommend insertion of a new device. He will undergo watchful waiting.

## 2012-05-21 NOTE — Patient Instructions (Signed)
See Dr. Ladona Ridgel in 3 months

## 2012-05-21 NOTE — Progress Notes (Signed)
HPI Roberto [name] returns today for followup. He is a very pleasant 76-year-old man with an ischemic cardiomyopathy, chronic systolic heart failure, paroxysmal atrial fibrillation, chronic amiodarone therapy, status post ICD implantation. Several months ago, his ICD high-voltage lead was fractured. We discussed the treatment options including lead extraction and insertion of a new system. After much reflection, we elected not to pursue this option. In the interim, he has been stable though not well. His appetite is poor. He has class III heart failure symptoms. He has become more sedentary. No syncope.  Allergies  Allergen Reactions  . Atorvastatin     REACTION: muscle ache in legs  . Crestor (Rosuvastatin Calcium) Other (See Comments)    Lower ext fatigue and soreness- if taken everyday  . Lisinopril Diarrhea  . Morphine     REACTION: AMS - agitation  . Prednisone     REACTION: Can't breath  . Zolpidem Tartrate     REACTION: AMS     Current Outpatient Prescriptions  Medication Sig Dispense Refill  . acetaminophen (TYLENOL) 325 MG tablet Take 325 mg by mouth every 6 (six) hours as needed. For pain      . albuterol (PROVENTIL) (2.5 MG/3ML) 0.083% nebulizer solution Take 2.5 mg by nebulization every 6 (six) hours as needed. For shortness of breath      . amiodarone (PACERONE) 200 MG tablet Take 200 mg by mouth daily.       . aspirin 81 MG tablet Take 81 mg by mouth daily.        . budesonide (PULMICORT) 0.25 MG/2ML nebulizer solution Take 2 mLs (0.25 mg total) by nebulization 2 (two) times daily.  60 mL  5  . budesonide (RHINOCORT AQUA) 32 MCG/ACT nasal spray Place 2 sprays into the nose daily.  1 Bottle  12  . carvedilol (COREG) 3.125 MG tablet Take 1 tablet (3.125 mg total) by mouth 2 (two) times daily.  60 tablet  3  . cetirizine (ZYRTEC) 10 MG tablet Take 10 mg by mouth daily.        . clotrimazole-betamethasone (LOTRISONE) cream Apply 1 application topically 2 (two) times daily as  needed. For skin irritation      . cyanocobalamin 1000 MCG/ML injection Inject into the muscle every 30 (thirty) days.        . desoximetasone (TOPICORT) 0.25 % cream Apply 1 application topically 2 (two) times daily as needed. For rash      . finasteride (PROSCAR) 5 MG tablet Take 5 mg by mouth daily.        . furosemide (LASIX) 40 MG tablet Take 1 tablet (40 mg total) by mouth daily. Start tomorrow  30 tablet  0  . guaiFENesin (MUCINEX) 600 MG 12 hr tablet Take 1,200 mg by mouth 2 (two) times daily as needed. For congestion      . guaiFENesin-dextromethorphan (ROBITUSSIN DM) 100-10 MG/5ML syrup Take 5 mLs by mouth 3 (three) times daily as needed. As needed for cough      . ipratropium (ATROVENT) 0.03 % nasal spray Two sprays three times a day as needed before meals  30 mL  11  . isosorbide mononitrate (IMDUR) 60 MG 24 hr tablet Take 1 tablet (60 mg total) by mouth daily.  1 tablet  0  . levalbuterol (XOPENEX HFA) 45 MCG/ACT inhaler Inhale 1-2 puffs into the lungs every 4 (four) hours as needed. For shortness of breath  1 Inhaler  6  . levofloxacin (LEVAQUIN) 500 MG tablet Take 1 tablet (  500 mg total) by mouth daily. For 6 days  6 tablet  0  . levothyroxine (SYNTHROID, LEVOTHROID) 125 MCG tablet Take 125 mcg by mouth as directed.       . lithium carbonate 300 MG capsule Take 300 mg by mouth 2 (two) times daily with a meal.      . nitroGLYCERIN (NITROSTAT) 0.3 MG SL tablet Place 1 tablet (0.3 mg total) under the tongue every 5 (five) minutes as needed. For chest pain  25 tablet  2  . omeprazole (PRILOSEC OTC) 20 MG tablet Take 1 tablet (20 mg total) by mouth 2 (two) times daily.  60 tablet  0  . potassium chloride (K-DUR) 10 MEQ tablet Take 1 tablet (10 mEq total) by mouth daily.  30 tablet  6  . sitaGLIPtin (JANUVIA) 100 MG tablet Take 1 tablet (100 mg total) by mouth daily.  30 tablet  5  . Tamsulosin HCl (FLOMAX) 0.4 MG CAPS Take 0.4 mg by mouth daily.        . tiotropium (SPIRIVA) 18 MCG  inhalation capsule Place 18 mcg into inhaler and inhale daily.      . warfarin (COUMADIN) 5 MG tablet Take as directed by coumadin clinic  30 tablet  3     Past Medical History  Diagnosis Date  . CAD (coronary artery disease)     post CABG with prior percutaneous intervention of the saphenous vein graft with known saphenous vein graft disease.  . AAA (abdominal aortic aneurysm)     a. CTA 09/2011: 4.1-4.2cm infrarenal AAA.  . Aneurysm of thoracic aorta 1995    a. Rupture 1995 w/spontaneous resolution;  b. CTA 09/2011: 6.8cm dist, 5.7cm mid  . Popliteal artery aneurysm   . Myocardial infarction   . Ischemic cardiomyopathy   . Paroxysmal atrial fibrillation   . Hypertension   . Peripheral vascular disease   . Dyslipidemia   . Diabetes mellitus 2007    dx'd 2007 w/persisting FBS>125  . GERD (gastroesophageal reflux disease)   . Hiatal hernia   . Diverticular disease   . BPH (benign prostatic hypertrophy)     with hx of acute urinary retention (pt can self-cath)  . COPD (chronic obstructive pulmonary disease)     05/09/08 PFT FEV1 1.84 (55%), FVC 4.42 (95%), TLC 6.46 (92%), DLCO 72%, no BD response  . Pneumonia 1/09  . Allergic rhinitis   . Shingles     X 2 episodes, both in left scapula area  . OSA (obstructive sleep apnea) 06/14/09    PSG RDI 17, PLMI 96, intolerant of CPAP or BPAP  . Chronic renal insufficiency     Baseline Cr 1.5  . Prostate cancer 07/2008    Low grade; watchful waiting with Dr. Peterson  . Pulmonary nodule, left 03/02/2010    Left base; resolved on f/u CT  . Allergic rhinitis   . Carpal tunnel syndrome of left wrist 02/11/2011  . IMPLANTATION OF DEFIBRILLATOR, HX OF 09/02/2008    Medtronic 7232Cx Maximo V. Had a 6949 lead - failed and therapies turned off.  . IRON DEFICIENCY 06/08/2010    Hemoccult negative:  GI (Dr. Patterson) recommended no invasive diagnostics--watchful waiting (2012).  . UNSPECIFIED ANEMIA 06/01/2010    Multifactorial: iron def, CRI, vit  B12 def,   . Hypotension 04/14/2012  . CHF (congestive heart failure)   . Atrial fibrillation     ROS:   All systems reviewed and negative except as noted in the HPI.   Past   Surgical History  Procedure Date  . Cardiac defibrillator placement 08/28/2004    Dr. Devondre Guzzetta  . Colon surgery 1995    partial colectomy  . Coronary artery bypass graft 1996  . Aorta - bilateral femoral artery bypass graft 1999    Dr. Todd Early  . Nose surgery   . Insertion of pacing lead     New rate sensing pacing lead with removal of a previous implanted ICD and insertion of a device back in the pocket with defibrillation threshold testing  . Abdominal aortic aneurysm repair 1999    Ingrarenal repair/ graft  . Stents     Placed to the saphenous vein graft  . Radiofrequency ablation 2006    A flutter     Family History  Problem Relation Age of Onset  . Stroke Mother 80  . Heart attack Mother     CVA  . Cancer Father 74    Lung  . Heart attack Father 50  . Lung cancer Father   . Coronary artery disease Other     2 of 5 siblings with CAD     History   Social History  . Marital Status: Married    Spouse Name: N/A    Number of Children: N/A  . Years of Education: N/A   Occupational History  . RETIRED     Mechanic   Social History Main Topics  . Smoking status: Former Smoker -- 1.0 packs/day for 30 years    Types: Cigarettes    Quit date: 05/23/2003  . Smokeless tobacco: Former User    Types: Chew    Quit date: 05/14/2003     Comment: 80 pack a year history  . Alcohol Use: 0.6 oz/week    1 Cans of beer per week     Comment: occasionally  . Drug Use: No  . Sexually Active: Not on file   Other Topics Concern  . Not on file   Social History Narrative   Lives in [place] with his wife.Very involved in his church.Enjoys bluegrass music, going to the beach, spending time with extended family.Used to play golf twice a week up until 2012.No alc/drug use.Former smoker, quit  2005.     BP 115/66  Pulse 58  Wt 161 lb (73.029 kg)  Physical Exam:  Chronically ill appearing76-year-old man,  NAD HEENT: Unremarkable Neck:  7 cm JVD, no thyromegally Lungs:  Clear except for basilar rales.  HEART:  Regular rate rhythm, no murmurs, no rubs, no clicks Abd:  soft, positive bowel sounds, no organomegally, no rebound, no guarding Ext:  2 plus pulses, no edema, no cyanosis, no clubbing Skin:  No rashes no nodules Neuro:  CN II through XII intact, motor grossly intact  Assess/Plan:  

## 2012-05-21 NOTE — Assessment & Plan Note (Signed)
The patient denies anginal symptoms. I've encouraged the patient to increase his physical activity.

## 2012-05-27 ENCOUNTER — Encounter (HOSPITAL_COMMUNITY): Payer: Self-pay | Admitting: Pharmacy Technician

## 2012-05-28 ENCOUNTER — Encounter (HOSPITAL_COMMUNITY)
Admission: RE | Disposition: A | Discharge status: Home / Self Care | Payer: Self-pay | Source: Ambulatory Visit | Attending: Gastroenterology

## 2012-05-28 ENCOUNTER — Encounter (HOSPITAL_COMMUNITY): Payer: Self-pay

## 2012-05-28 ENCOUNTER — Ambulatory Visit (HOSPITAL_COMMUNITY)
Admission: RE | Admit: 2012-05-28 | Discharge: 2012-05-28 | Disposition: A | Discharge status: Home / Self Care | Payer: Medicare Other | Source: Ambulatory Visit | Attending: Gastroenterology | Admitting: Gastroenterology

## 2012-05-28 DIAGNOSIS — I129 Hypertensive chronic kidney disease with stage 1 through stage 4 chronic kidney disease, or unspecified chronic kidney disease: Secondary | ICD-10-CM | POA: Insufficient documentation

## 2012-05-28 DIAGNOSIS — R1310 Dysphagia, unspecified: Secondary | ICD-10-CM

## 2012-05-28 DIAGNOSIS — K449 Diaphragmatic hernia without obstruction or gangrene: Secondary | ICD-10-CM

## 2012-05-28 DIAGNOSIS — K222 Esophageal obstruction: Secondary | ICD-10-CM

## 2012-05-28 DIAGNOSIS — C61 Malignant neoplasm of prostate: Secondary | ICD-10-CM | POA: Insufficient documentation

## 2012-05-28 DIAGNOSIS — Z7982 Long term (current) use of aspirin: Secondary | ICD-10-CM | POA: Insufficient documentation

## 2012-05-28 DIAGNOSIS — I2510 Atherosclerotic heart disease of native coronary artery without angina pectoris: Secondary | ICD-10-CM | POA: Insufficient documentation

## 2012-05-28 DIAGNOSIS — G4733 Obstructive sleep apnea (adult) (pediatric): Secondary | ICD-10-CM | POA: Insufficient documentation

## 2012-05-28 DIAGNOSIS — I739 Peripheral vascular disease, unspecified: Secondary | ICD-10-CM | POA: Insufficient documentation

## 2012-05-28 DIAGNOSIS — E119 Type 2 diabetes mellitus without complications: Secondary | ICD-10-CM | POA: Insufficient documentation

## 2012-05-28 DIAGNOSIS — I252 Old myocardial infarction: Secondary | ICD-10-CM | POA: Insufficient documentation

## 2012-05-28 DIAGNOSIS — I4891 Unspecified atrial fibrillation: Secondary | ICD-10-CM | POA: Insufficient documentation

## 2012-05-28 DIAGNOSIS — J4489 Other specified chronic obstructive pulmonary disease: Secondary | ICD-10-CM | POA: Insufficient documentation

## 2012-05-28 DIAGNOSIS — N189 Chronic kidney disease, unspecified: Secondary | ICD-10-CM | POA: Insufficient documentation

## 2012-05-28 DIAGNOSIS — Q409 Congenital malformation of upper alimentary tract, unspecified: Secondary | ICD-10-CM | POA: Insufficient documentation

## 2012-05-28 DIAGNOSIS — E785 Hyperlipidemia, unspecified: Secondary | ICD-10-CM | POA: Insufficient documentation

## 2012-05-28 DIAGNOSIS — Z7901 Long term (current) use of anticoagulants: Secondary | ICD-10-CM | POA: Insufficient documentation

## 2012-05-28 DIAGNOSIS — Z79899 Other long term (current) drug therapy: Secondary | ICD-10-CM | POA: Insufficient documentation

## 2012-05-28 DIAGNOSIS — I509 Heart failure, unspecified: Secondary | ICD-10-CM | POA: Insufficient documentation

## 2012-05-28 DIAGNOSIS — J449 Chronic obstructive pulmonary disease, unspecified: Secondary | ICD-10-CM | POA: Insufficient documentation

## 2012-05-28 DIAGNOSIS — I2589 Other forms of chronic ischemic heart disease: Secondary | ICD-10-CM | POA: Insufficient documentation

## 2012-05-28 HISTORY — PX: ESOPHAGOGASTRODUODENOSCOPY (EGD) WITH ESOPHAGEAL DILATION: SHX5812

## 2012-05-28 SURGERY — ESOPHAGOGASTRODUODENOSCOPY (EGD) WITH ESOPHAGEAL DILATION
Anesthesia: Moderate Sedation

## 2012-05-28 MED ORDER — SODIUM CHLORIDE 0.9 % IV SOLN
INTRAVENOUS | Status: DC
  Administered 2012-05-28: 500 mL via INTRAVENOUS

## 2012-05-28 MED ORDER — FENTANYL CITRATE 0.05 MG/ML IJ SOLN
INTRAMUSCULAR | Status: AC
  Filled 2012-05-28: qty 2

## 2012-05-28 MED ORDER — MIDAZOLAM HCL 10 MG/2ML IJ SOLN
INTRAMUSCULAR | Status: DC | PRN
  Administered 2012-05-28 (×2): 2 mg via INTRAVENOUS

## 2012-05-28 MED ORDER — BUTAMBEN-TETRACAINE-BENZOCAINE 2-2-14 % EX AERO
INHALATION_SPRAY | CUTANEOUS | Status: DC | PRN
  Administered 2012-05-28: 2 via TOPICAL

## 2012-05-28 MED ORDER — FENTANYL CITRATE 0.05 MG/ML IJ SOLN
INTRAMUSCULAR | Status: DC | PRN
  Administered 2012-05-28 (×2): 25 ug via INTRAVENOUS

## 2012-05-28 MED ORDER — MIDAZOLAM HCL 10 MG/2ML IJ SOLN
INTRAMUSCULAR | Status: AC
  Filled 2012-05-28: qty 2

## 2012-05-28 NOTE — Interval H&P Note (Signed)
History and Physical Interval Note:  05/28/2012 8:54 AM  Roberto [name]  has presented today for surgery, with the diagnosis of Dysphagia [787.20]  The various methods of treatment have been discussed with the patient and family. After consideration of risks, benefits and other options for treatment, the patient has consented to  Procedure(s) (LRB) with comments: ESOPHAGOGASTRODUODENOSCOPY (EGD) WITH ESOPHAGEAL DILATION (N/A) as a surgical intervention .  The patient's history has been reviewed, patient examined, no change in status, stable for surgery.  I have reviewed the patient's chart and labs.  Questions were answered to the patient's satisfaction.     Rob Bunting

## 2012-05-28 NOTE — H&P (View-Only) (Signed)
HPI Roberto [name] returns today for followup. He is a very pleasant 77 year old man with an ischemic cardiomyopathy, chronic systolic heart failure, paroxysmal atrial fibrillation, chronic amiodarone therapy, status post ICD implantation. Several months ago, his ICD high-voltage lead was fractured. We discussed the treatment options including lead extraction and insertion of a new system. After much reflection, we elected not to pursue this option. In the interim, he has been stable though not well. His appetite is poor. He has class III heart failure symptoms. He has become more sedentary. No syncope.  Allergies  Allergen Reactions  . Atorvastatin     REACTION: muscle ache in legs  . Crestor (Rosuvastatin Calcium) Other (See Comments)    Lower ext fatigue and soreness- if taken everyday  . Lisinopril Diarrhea  . Morphine     REACTION: AMS - agitation  . Prednisone     REACTION: Can't breath  . Zolpidem Tartrate     REACTION: AMS     Current Outpatient Prescriptions  Medication Sig Dispense Refill  . acetaminophen (TYLENOL) 325 MG tablet Take 325 mg by mouth every 6 (six) hours as needed. For pain      . albuterol (PROVENTIL) (2.5 MG/3ML) 0.083% nebulizer solution Take 2.5 mg by nebulization every 6 (six) hours as needed. For shortness of breath      . amiodarone (PACERONE) 200 MG tablet Take 200 mg by mouth daily.       Marland Kitchen aspirin 81 MG tablet Take 81 mg by mouth daily.        . budesonide (PULMICORT) 0.25 MG/2ML nebulizer solution Take 2 mLs (0.25 mg total) by nebulization 2 (two) times daily.  60 mL  5  . budesonide (RHINOCORT AQUA) 32 MCG/ACT nasal spray Place 2 sprays into the nose daily.  1 Bottle  12  . carvedilol (COREG) 3.125 MG tablet Take 1 tablet (3.125 mg total) by mouth 2 (two) times daily.  60 tablet  3  . cetirizine (ZYRTEC) 10 MG tablet Take 10 mg by mouth daily.        . clotrimazole-betamethasone (LOTRISONE) cream Apply 1 application topically 2 (two) times daily as  needed. For skin irritation      . cyanocobalamin 1000 MCG/ML injection Inject into the muscle every 30 (thirty) days.        Marland Kitchen desoximetasone (TOPICORT) 0.25 % cream Apply 1 application topically 2 (two) times daily as needed. For rash      . finasteride (PROSCAR) 5 MG tablet Take 5 mg by mouth daily.        . furosemide (LASIX) 40 MG tablet Take 1 tablet (40 mg total) by mouth daily. Start tomorrow  30 tablet  0  . guaiFENesin (MUCINEX) 600 MG 12 hr tablet Take 1,200 mg by mouth 2 (two) times daily as needed. For congestion      . guaiFENesin-dextromethorphan (ROBITUSSIN DM) 100-10 MG/5ML syrup Take 5 mLs by mouth 3 (three) times daily as needed. As needed for cough      . ipratropium (ATROVENT) 0.03 % nasal spray Two sprays three times a day as needed before meals  30 mL  11  . isosorbide mononitrate (IMDUR) 60 MG 24 hr tablet Take 1 tablet (60 mg total) by mouth daily.  1 tablet  0  . levalbuterol (XOPENEX HFA) 45 MCG/ACT inhaler Inhale 1-2 puffs into the lungs every 4 (four) hours as needed. For shortness of breath  1 Inhaler  6  . levofloxacin (LEVAQUIN) 500 MG tablet Take 1 tablet (  500 mg total) by mouth daily. For 6 days  6 tablet  0  . levothyroxine (SYNTHROID, LEVOTHROID) 125 MCG tablet Take 125 mcg by mouth as directed.       . lithium carbonate 300 MG capsule Take 300 mg by mouth 2 (two) times daily with a meal.      . nitroGLYCERIN (NITROSTAT) 0.3 MG SL tablet Place 1 tablet (0.3 mg total) under the tongue every 5 (five) minutes as needed. For chest pain  25 tablet  2  . omeprazole (PRILOSEC OTC) 20 MG tablet Take 1 tablet (20 mg total) by mouth 2 (two) times daily.  60 tablet  0  . potassium chloride (K-DUR) 10 MEQ tablet Take 1 tablet (10 mEq total) by mouth daily.  30 tablet  6  . sitaGLIPtin (JANUVIA) 100 MG tablet Take 1 tablet (100 mg total) by mouth daily.  30 tablet  5  . Tamsulosin HCl (FLOMAX) 0.4 MG CAPS Take 0.4 mg by mouth daily.        Marland Kitchen tiotropium (SPIRIVA) 18 MCG  inhalation capsule Place 18 mcg into inhaler and inhale daily.      Marland Kitchen warfarin (COUMADIN) 5 MG tablet Take as directed by coumadin clinic  30 tablet  3     Past Medical History  Diagnosis Date  . CAD (coronary artery disease)     post CABG with prior percutaneous intervention of the saphenous vein graft with known saphenous vein graft disease.  Marland Kitchen AAA (abdominal aortic aneurysm)     a. CTA 09/2011: 4.1-4.2cm infrarenal AAA.  Marland Kitchen Aneurysm of thoracic aorta 1995    a. Rupture 1995 w/spontaneous resolution;  b. CTA 09/2011: 6.8cm dist, 5.7cm mid  . Popliteal artery aneurysm   . Myocardial infarction   . Ischemic cardiomyopathy   . Paroxysmal atrial fibrillation   . Hypertension   . Peripheral vascular disease   . Dyslipidemia   . Diabetes mellitus 2007    dx'd 2007 w/persisting FBS>125  . GERD (gastroesophageal reflux disease)   . Hiatal hernia   . Diverticular disease   . BPH (benign prostatic hypertrophy)     with hx of acute urinary retention (pt can self-cath)  . COPD (chronic obstructive pulmonary disease)     05/09/08 PFT FEV1 1.84 (55%), FVC 4.42 (95%), TLC 6.46 (92%), DLCO 72%, no BD response  . Pneumonia 1/09  . Allergic rhinitis   . Shingles     X 2 episodes, both in left scapula area  . OSA (obstructive sleep apnea) 06/14/09    PSG RDI 17, PLMI 96, intolerant of CPAP or BPAP  . Chronic renal insufficiency     Baseline Cr 1.5  . Prostate cancer 07/2008    Low grade; watchful waiting with [name]  . Pulmonary nodule, left 03/02/2010    Left base; resolved on f/u CT  . Allergic rhinitis   . Carpal tunnel syndrome of left wrist 02/11/2011  . IMPLANTATION OF DEFIBRILLATOR, HX OF 09/02/2008    Medtronic 7232Cx Maximo V. Had a 6949 lead - failed and therapies turned off.  . IRON DEFICIENCY 06/08/2010    Hemoccult negative:  GI ([name]) recommended no invasive diagnostics--watchful waiting (2012).  . UNSPECIFIED ANEMIA 06/01/2010    Multifactorial: iron def, CRI, vit  B12 def,   . Hypotension 04/14/2012  . CHF (congestive heart failure)   . Atrial fibrillation     ROS:   All systems reviewed and negative except as noted in the HPI.   Past  Surgical History  Procedure Date  . Cardiac defibrillator placement 08/28/2004    [name]  . Colon surgery 1995    partial colectomy  . Coronary artery bypass graft 1996  . Aorta - bilateral femoral artery bypass graft 1999    [name] Early  . Nose surgery   . Insertion of pacing lead     New rate sensing pacing lead with removal of a previous implanted ICD and insertion of a device back in the pocket with defibrillation threshold testing  . Abdominal aortic aneurysm repair 1999    Ingrarenal repair/ graft  . Stents     Placed to the saphenous vein graft  . Radiofrequency ablation 2006    A flutter     Family History  Problem Relation Age of Onset  . Stroke Mother 36  . Heart attack Mother     CVA  . Cancer Father 81    Lung  . Heart attack Father 91  . Lung cancer Father   . Coronary artery disease Other     2 of 5 siblings with CAD     History   Social History  . Marital Status: Married    Spouse Name: N/A    Number of Children: N/A  . Years of Education: N/A   Occupational History  . RETIRED     Mechanic   Social History Main Topics  . Smoking status: Former Smoker -- 1.0 packs/day for 30 years    Types: Cigarettes    Quit date: 05/23/2003  . Smokeless tobacco: Former Neurosurgeon    Types: Chew    Quit date: 05/14/2003     Comment: 80 pack a year history  . Alcohol Use: 0.6 oz/week    1 Cans of beer per week     Comment: occasionally  . Drug Use: No  . Sexually Active: Not on file   Other Topics Concern  . Not on file   Social History Narrative   Lives in [place] with his wife.Very involved in his church.Enjoys bluegrass music, going to [employer], spending time with extended family.Used to play golf twice a week up until 2012.No alc/drug use.Former smoker, quit  2005.     BP 115/66  Pulse 58  Wt 161 lb (73.029 kg)  Physical Exam:  Chronically ill appearing37 year old man,  NAD HEENT: Unremarkable Neck:  7 cm JVD, no thyromegally Lungs:  Clear except for basilar rales.  HEART:  Regular rate rhythm, no murmurs, no rubs, no clicks Abd:  soft, positive bowel sounds, no organomegally, no rebound, no guarding Ext:  2 plus pulses, no edema, no cyanosis, no clubbing Skin:  No rashes no nodules Neuro:  CN II through XII intact, motor grossly intact  Assess/Plan:

## 2012-05-28 NOTE — Op Note (Signed)
[facility] [address] [place], [address]   ENDOSCOPY PROCEDURE REPORT  PATIENT: Roberto [name], Roberto [name]  MR#: [id number] BIRTHDATE: 1935/12/08 , 76  yrs. old GENDER: Male ENDOSCOPIST: Rachael Fee, MD PROCEDURE DATE:  05/28/2012 PROCEDURE:  EGD, balloon dilation ASA CLASS:     Class III INDICATIONS:  dysphagia, slight GE junction narrowing on imaging. MEDICATIONS: Fentanyl 50 mcg IV and Versed 4 mg IV TOPICAL ANESTHETIC: Cetacaine Spray  DESCRIPTION OF PROCEDURE: After the risks benefits and alternatives of the procedure were thoroughly explained, informed consent was obtained.  The Pentax Gastroscope [id number] endoscope was introduced through the mouth and advanced to the second portion of the duodenum. Without limitations.  The instrument was slowly withdrawn as the mucosa was fully examined.     There was a small hiatal hernia, distall tortuous esophagus.  The mucosa at GE junction was normal but slightly narrowed.  The GE junction was dilated with CRE TTS balloon held inflated for 60 seconds at 19mm.  The examination was otherwise normal. Retroflexed views revealed no abnormalities.     The scope was then withdrawn from the patient and the procedure completed.  COMPLICATIONS: There were no complications. ENDOSCOPIC IMPRESSION: There was a small hiatal hernia, distall tortuous esophagus.  The mucosa at GE junction was normal but slightly narrowed.  The GE junction was dilated with CRE TTS balloon held inflated for 60 seconds at 19mm.  The examination was otherwise normal.  RECOMMENDATIONS: You can resume your coumadin today. Follow up as needed for swallowing troubles.    eSigned:  Rachael Fee, MD 05/28/2012 9:40 AM   CC: Santiago Bumpers, MD

## 2012-05-28 NOTE — Progress Notes (Signed)
Late entry - pt had EKG due to concern re: his sinus brady and his pacemaker - whether or not is was working properly.  Jana Half, MDT came and interrogated the pacer and confirmed no changes in device function since last office interrogation.  Per Leta Jungling, Dr Ladona Ridgel was contacted and informed ok to d/c pt to home.  Paged Dr. Ladona Ridgel and he confirmed this d/c.

## 2012-05-29 ENCOUNTER — Ambulatory Visit (INDEPENDENT_AMBULATORY_CARE_PROVIDER_SITE_OTHER): Payer: Medicare Other | Admitting: Family Medicine

## 2012-05-29 ENCOUNTER — Encounter (HOSPITAL_COMMUNITY): Payer: Self-pay | Admitting: Gastroenterology

## 2012-05-29 VITALS — BP 125/73 | HR 53 | Ht 70.5 in | Wt 167.0 lb

## 2012-05-29 DIAGNOSIS — E538 Deficiency of other specified B group vitamins: Secondary | ICD-10-CM

## 2012-05-29 MED ORDER — FLUTICASONE PROPIONATE 50 MCG/ACT NA SUSP: 2.0000 | Freq: Every day | NASAL | Status: DC

## 2012-05-29 MED ORDER — CYANOCOBALAMIN 1000 MCG/ML IJ SOLN
1000.0000 ug | Freq: Once | INTRAMUSCULAR | Status: AC
  Administered 2012-05-29: 1000 ug via INTRAMUSCULAR

## 2012-05-29 MED ORDER — PREDNISONE 20 MG PO TABS: ORAL_TABLET | ORAL | Status: DC

## 2012-05-29 NOTE — Progress Notes (Signed)
OFFICE NOTE  05/29/2012  CC:  Chief Complaint  Patient presents with  . Follow-up    malaise/dizziness     HPI: Patient is a 77 y.o. Caucasian male who is here for routine chronic illness follow up.  He has his usual complaint of late, which is that he "gives out" easily when walking or doing any activity.  No chest pain, no fevers. No change in baseline mild cough, which is primarily a throat clearing cough.  Sugars have been normal still. Uses xopenex 2-4 times per day avg.  Rare feeling of random, brief lightheadedness-- not clearly connected to postural changes.  Pertinent PMH:  Past Medical History  Diagnosis Date  . CAD (coronary artery disease)     post CABG with prior percutaneous intervention of the saphenous vein graft with known saphenous vein graft disease.  Marland Kitchen AAA (abdominal aortic aneurysm)     a. CTA 09/2011: 4.1-4.2cm infrarenal AAA.  Marland Kitchen Aneurysm of thoracic aorta 1995    a. Rupture 1995 w/spontaneous resolution;  b. CTA 09/2011: 6.8cm dist, 5.7cm mid  . Popliteal artery aneurysm   . Myocardial infarction   . Ischemic cardiomyopathy   . Paroxysmal atrial fibrillation   . Hypertension   . Peripheral vascular disease   . Dyslipidemia   . Diabetes mellitus 2007    dx'd 2007 w/persisting FBS>125  . GERD (gastroesophageal reflux disease)   . Hiatal hernia   . Diverticular disease   . BPH (benign prostatic hypertrophy)     with hx of acute urinary retention (pt can self-cath)  . COPD (chronic obstructive pulmonary disease)     05/09/08 PFT FEV1 1.84 (55%), FVC 4.42 (95%), TLC 6.46 (92%), DLCO 72%, no BD response  . Pneumonia 1/09  . Allergic rhinitis   . Shingles     X 2 episodes, both in left scapula area  . OSA (obstructive sleep apnea) 06/14/09    PSG RDI 17, PLMI 96, intolerant of CPAP or BPAP  . Chronic renal insufficiency     Baseline Cr 1.5  . Prostate cancer 07/2008    Low grade; watchful waiting with [name]  . Pulmonary nodule, left 03/02/2010   Left base; resolved on f/u CT  . Allergic rhinitis   . Carpal tunnel syndrome of left wrist 02/11/2011  . IMPLANTATION OF DEFIBRILLATOR, HX OF 09/02/2008    Medtronic 7232Cx Maximo V. Had a 6949 lead - failed and therapies turned off.  . IRON DEFICIENCY 06/08/2010    Hemoccult negative:  GI ([name]) recommended no invasive diagnostics--watchful waiting (2012).  . UNSPECIFIED ANEMIA 06/01/2010    Multifactorial: iron def, CRI, vit B12 def,   . Hypotension 04/14/2012  . CHF (congestive heart failure)   . Atrial fibrillation    Past surgical, social, and family history reviewed and no changes noted since last office visit.  MEDS:  Facility-Administered Medications Prior to Visit  Medication Dose Route Frequency Provider Last Rate Last Dose  . 0.9 %  sodium chloride infusion   Intravenous Continuous Rachael Fee, MD 20 mL/hr at 05/28/12 0910 500 mL at 05/28/12 [id number]   Outpatient Prescriptions Prior to Visit  Medication Sig Dispense Refill  . acetaminophen (TYLENOL) 500 MG tablet Take 1,000 mg by mouth every 6 (six) hours as needed. For headache, pain or fever.      Marland Kitchen albuterol (PROVENTIL) (2.5 MG/3ML) 0.083% nebulizer solution Take 2.5 mg by nebulization every 6 (six) hours as needed. For shortness of breath      .  amiodarone (PACERONE) 200 MG tablet Take 200 mg by mouth every morning.       Marland Kitchen aspirin EC 81 MG tablet Take 81 mg by mouth every morning.      . budesonide (PULMICORT) 0.25 MG/2ML nebulizer solution Take 2 mLs (0.25 mg total) by nebulization 2 (two) times daily.  60 mL  5  . budesonide (RHINOCORT AQUA) 32 MCG/ACT nasal spray Place 2 sprays into the nose every morning.      . carvedilol (COREG) 3.125 MG tablet Take 1 tablet (3.125 mg total) by mouth 2 (two) times daily.  60 tablet  3  . cetirizine (ZYRTEC) 10 MG tablet Take 10 mg by mouth every morning.       . cyanocobalamin 1000 MCG/ML injection Inject 1,000 mcg into the muscle every 30 (thirty) days.       . finasteride  (PROSCAR) 5 MG tablet Take 5 mg by mouth at bedtime.       . furosemide (LASIX) 40 MG tablet Take 40 mg by mouth every morning.       Marland Kitchen guaiFENesin (MUCINEX) 600 MG 12 hr tablet Take 1,200 mg by mouth 2 (two) times daily as needed. For congestion      . guaiFENesin-dextromethorphan (ROBITUSSIN DM) 100-10 MG/5ML syrup Take 5 mLs by mouth 3 (three) times daily as needed. For cough.      Marland Kitchen ipratropium (ATROVENT) 0.03 % nasal spray Place 2 sprays into the nose 3 (three) times daily as needed. For allergies.      . isosorbide mononitrate (IMDUR) 60 MG 24 hr tablet Take 60 mg by mouth every morning.   1 tablet  0  . levalbuterol (XOPENEX HFA) 45 MCG/ACT inhaler Inhale 1-2 puffs into the lungs every 4 (four) hours as needed. For shortness of breath  1 Inhaler  6  . levothyroxine (SYNTHROID, LEVOTHROID) 125 MCG tablet Take 62.5-125 mcg by mouth every other day. He alternates daily with a whole tablet ( ) or a half a tablet (6.52mcg).      Marland Kitchen lithium carbonate 300 MG capsule Take 300 mg by mouth 2 (two) times daily with a meal.      . nitroGLYCERIN (NITROSTAT) 0.3 MG SL tablet Place 1 tablet (0.3 mg total) under the tongue every 5 (five) minutes as needed. For chest pain  25 tablet  2  . potassium chloride (K-DUR) 10 MEQ tablet Take 10 mEq by mouth every morning.      . ranitidine (ZANTAC) 150 MG tablet Take 150 mg by mouth 2 (two) times daily.      . sitaGLIPtin (JANUVIA) 100 MG tablet Take 100 mg by mouth every morning.      . Tamsulosin HCl (FLOMAX) 0.4 MG CAPS Take 0.8 mg by mouth every morning.       . tiotropium (SPIRIVA) 18 MCG inhalation capsule Place 18 mcg into inhaler and inhale every morning.       . warfarin (COUMADIN) 5 MG tablet Take 2.5-5 mg by mouth every evening. He takes one tablet (5mg ) on Monday and Friday and half a tablet (2.5mg ) on Tuesday, Wednesday, Thursday, Saturday and Sunday.      . clotrimazole-betamethasone (LOTRISONE) cream Apply 1 application topically 2 (two) times daily  as needed. For skin irritation      . desoximetasone (TOPICORT) 0.25 % cream Apply 1 application topically 2 (two) times daily as needed. For rash       Last reviewed on 05/29/2012  2:27 PM by Luisa Dago, CMA  PE: Blood pressure 125/73, pulse 53, height 5' 10.5" (1.791 m), weight 167 lb (75.751 kg). Gen: Alert, well appearing.  Patient is oriented to person, place, time, and situation. CV: RRR LUNGS: trace insp rhonchi, trace exp wheeze diffusely, nonlabored resps.  Exp phase not significantly prolonged, aeration is decent, symmetric. EXT: no clubbing, cyanosis, or edema.    IMPRESSION AND PLAN:  DOE: I believe he is have mild COPD exacerbation.  He could also conceivably be at the point of needing low dose daily systemic steroid for this. Will do low dose taper--he doesn't tolerate high doses of prednisone well at all (psych sx's).  Will do 20mg  qd x 5d, 10mg  qd x 6d, and 10 mg qod x 6 doses. Check INR in 1 wk (already scheduled).  Spent 30 min with pt today, with >50% of this time spent in counseling and care coordination regarding the above problems.  FOLLOW UP: 3 mo

## 2012-06-02 ENCOUNTER — Other Ambulatory Visit: Payer: Self-pay | Admitting: Nurse Practitioner

## 2012-06-04 ENCOUNTER — Other Ambulatory Visit: Payer: Self-pay | Admitting: Nurse Practitioner

## 2012-06-05 ENCOUNTER — Ambulatory Visit (INDEPENDENT_AMBULATORY_CARE_PROVIDER_SITE_OTHER): Payer: Medicare Other | Admitting: Cardiology

## 2012-06-05 ENCOUNTER — Encounter: Payer: Self-pay | Admitting: Cardiology

## 2012-06-05 ENCOUNTER — Ambulatory Visit (INDEPENDENT_AMBULATORY_CARE_PROVIDER_SITE_OTHER): Payer: Medicare Other

## 2012-06-05 VITALS — BP 140/82 | HR 59 | Ht 71.5 in | Wt 163.0 lb

## 2012-06-05 DIAGNOSIS — I4891 Unspecified atrial fibrillation: Secondary | ICD-10-CM

## 2012-06-05 DIAGNOSIS — I712 Thoracic aortic aneurysm, without rupture, unspecified: Secondary | ICD-10-CM

## 2012-06-05 DIAGNOSIS — I2510 Atherosclerotic heart disease of native coronary artery without angina pectoris: Secondary | ICD-10-CM

## 2012-06-05 DIAGNOSIS — I2589 Other forms of chronic ischemic heart disease: Secondary | ICD-10-CM

## 2012-06-05 DIAGNOSIS — I25810 Atherosclerosis of coronary artery bypass graft(s) without angina pectoris: Secondary | ICD-10-CM

## 2012-06-05 DIAGNOSIS — N182 Chronic kidney disease, stage 2 (mild): Secondary | ICD-10-CM

## 2012-06-05 DIAGNOSIS — Z7901 Long term (current) use of anticoagulants: Secondary | ICD-10-CM

## 2012-06-05 LAB — BASIC METABOLIC PANEL
BUN: 24 mg/dL — ABNORMAL HIGH (ref 6–23)
Chloride: 104 mEq/L (ref 96–112)
Creat: 1.29 mg/dL (ref 0.50–1.35)
Glucose, Bld: 100 mg/dL — ABNORMAL HIGH (ref 70–99)
Potassium: 3.9 mEq/L (ref 3.5–5.3)

## 2012-06-05 NOTE — Patient Instructions (Addendum)
Your physician recommends that you have lab work today: BMP  Your physician recommends that you schedule a follow-up appointment in: 3 MONTHS with Dr Excell Seltzer  Your physician recommends that you continue on your current medications as directed. Please refer to the Current Medication list given to you today.  Please have a device check performed on Monday 06/08/12.

## 2012-06-07 NOTE — Assessment & Plan Note (Signed)
We continue to monitor closely.

## 2012-06-07 NOTE — Assessment & Plan Note (Signed)
Has been followed by Dr. Arbie Cookey --- referred to [facility] but patient elected against high risk surgery.

## 2012-06-07 NOTE — Assessment & Plan Note (Addendum)
Holding his own on a complicated regimen.  Currently on low dose beta blockers  (cannot increase carvedilol due to COPD).  Also on amiodarone, nitrates, diuretics.  Not currently on an ACE or ARB.  Was on ACE previously and it was stopped some time between 2011-12.  Cannot find records as to why it was discontinued.

## 2012-06-07 NOTE — Progress Notes (Signed)
HPI:  Patient returns in followup. He does not appear be doing all that well, and yet he seems to be holding his own. He is limited by shortness of breath, but he saw his primary care Dr., and prednisone was increased. This seems to have helped his breathing quite a bit. He denies chest pain. He currently is not having abdominal pain. His current rhythm remained stable on amiodarone  Current Outpatient Prescriptions  Medication Sig Dispense Refill  . acetaminophen (TYLENOL) 500 MG tablet Take 1,000 mg by mouth every 6 (six) hours as needed. For headache, pain or fever.      Marland Kitchen albuterol (PROVENTIL) (2.5 MG/3ML) 0.083% nebulizer solution Take 2.5 mg by nebulization every 6 (six) hours as needed. For shortness of breath      . amiodarone (PACERONE) 200 MG tablet Take 200 mg by mouth every morning.       Marland Kitchen aspirin EC 81 MG tablet Take 81 mg by mouth every morning.      . budesonide (PULMICORT) 0.25 MG/2ML nebulizer solution Take 2 mLs (0.25 mg total) by nebulization 2 (two) times daily.  60 mL  5  . carvedilol (COREG) 3.125 MG tablet TAKE 1 TABLET BY MOUTH 2 TIMES A DAY  60 tablet  3  . cetirizine (ZYRTEC) 10 MG tablet Take 10 mg by mouth every morning.       . clotrimazole-betamethasone (LOTRISONE) cream Apply 1 application topically 2 (two) times daily as needed. For skin irritation      . cyanocobalamin 1000 MCG/ML injection Inject 1,000 mcg into the muscle every 30 (thirty) days.       Marland Kitchen desoximetasone (TOPICORT) 0.25 % cream Apply 1 application topically 2 (two) times daily as needed. For rash      . finasteride (PROSCAR) 5 MG tablet Take 5 mg by mouth at bedtime.       . fluticasone (FLONASE) 50 MCG/ACT nasal spray Place 2 sprays into the nose daily.  16 g  6  . furosemide (LASIX) 40 MG tablet Take 40 mg by mouth every morning.       Marland Kitchen guaiFENesin (MUCINEX) 600 MG 12 hr tablet Take 1,200 mg by mouth 2 (two) times daily as needed. For congestion      . guaiFENesin-dextromethorphan (ROBITUSSIN  DM) 100-10 MG/5ML syrup Take 5 mLs by mouth 3 (three) times daily as needed. For cough.      Marland Kitchen ipratropium (ATROVENT) 0.03 % nasal spray Place 2 sprays into the nose 3 (three) times daily as needed. For allergies.      . isosorbide mononitrate (IMDUR) 60 MG 24 hr tablet Take 60 mg by mouth every morning.   1 tablet  0  . levalbuterol (XOPENEX HFA) 45 MCG/ACT inhaler Inhale 1-2 puffs into the lungs every 4 (four) hours as needed. For shortness of breath  1 Inhaler  6  . levothyroxine (SYNTHROID, LEVOTHROID) 125 MCG tablet Take 62.5-125 mcg by mouth every other day. He alternates daily with a whole tablet ( ) or a half a tablet (6.81mcg).      Marland Kitchen lithium carbonate 300 MG capsule Take 300 mg by mouth 2 (two) times daily with a meal.      . nitroGLYCERIN (NITROSTAT) 0.3 MG SL tablet Place 1 tablet (0.3 mg total) under the tongue every 5 (five) minutes as needed. For chest pain  25 tablet  2  . potassium chloride (KLOR-CON M10) 10 MEQ tablet Take 10 mEq by mouth daily.      Marland Kitchen  predniSONE (DELTASONE) 20 MG tablet 1 tab po qd x 5d, then 1/2 tab po qd x 6d, then 1/2 tab po qod x 6 doses, then stop  11 tablet  0  . ranitidine (ZANTAC) 150 MG tablet Take 150 mg by mouth 2 (two) times daily.      . sitaGLIPtin (JANUVIA) 100 MG tablet Take 100 mg by mouth every morning.      . Tamsulosin HCl (FLOMAX) 0.4 MG CAPS Take 0.8 mg by mouth every morning.       . tiotropium (SPIRIVA) 18 MCG inhalation capsule Place 18 mcg into inhaler and inhale every morning.       . warfarin (COUMADIN) 5 MG tablet Take 2.5-5 mg by mouth every evening. He takes one tablet (5mg ) on Monday and Friday and half a tablet (2.5mg ) on Tuesday, Wednesday, Thursday, Saturday and "Sunday.        Allergies  Allergen Reactions  . Atorvastatin     REACTION: muscle ache in legs  . Crestor (Rosuvastatin Calcium) Other (See Comments)    Lower ext fatigue and soreness- if taken everyday  . Lisinopril Diarrhea  . Morphine     REACTION: AMS -  agitation  . Prednisone     REACTION: Can't breath  . Zolpidem Tartrate     REACTION: AMS    Past Medical History  Diagnosis Date  . CAD (coronary artery disease)     post CABG with prior percutaneous intervention of the saphenous vein graft with known saphenous vein graft disease.  . AAA (abdominal aortic aneurysm)     a. CTA 09/2011: 4.1-4.2cm infrarenal AAA.  . Aneurysm of thoracic aorta 1995    a. Rupture 1995 w/spontaneous resolution;  b. CTA 09/2011: 6.8cm dist, 5.7cm mid  . Popliteal artery aneurysm   . Myocardial infarction   . Ischemic cardiomyopathy   . Paroxysmal atrial fibrillation   . Hypertension   . Peripheral vascular disease   . Dyslipidemia   . Diabetes mellitus 2007    dx'd 2007 w/persisting FBS>125  . GERD (gastroesophageal reflux disease)   . Hiatal hernia   . Diverticular disease   . BPH (benign prostatic hypertrophy)     with hx of acute urinary retention (pt can self-cath)  . COPD (chronic obstructive pulmonary disease)     12" /28/09 PFT FEV1 1.84 (55%), FVC 4.42 (95%), TLC 6.46 (92%), DLCO 72%, no BD response  . Pneumonia 1/09  . Allergic rhinitis   . Shingles     X 2 episodes, both in left scapula area  . OSA (obstructive sleep apnea) 06/14/09    PSG RDI 17, PLMI 96, intolerant of CPAP or BPAP  . Chronic renal insufficiency     Baseline Cr 1.5  . Prostate cancer 07/2008    Low grade; watchful waiting with [name]  . Pulmonary nodule, left 03/02/2010    Left base; resolved on f/u CT  . Allergic rhinitis   . Carpal tunnel syndrome of left wrist 02/11/2011  . IMPLANTATION OF DEFIBRILLATOR, HX OF 09/02/2008    Medtronic 7232Cx Maximo V. Had a 6949 lead - failed and therapies turned off.  . IRON DEFICIENCY 06/08/2010    Hemoccult negative:  GI ([name]) recommended no invasive diagnostics--watchful waiting (2012).  . UNSPECIFIED ANEMIA 06/01/2010    Multifactorial: iron def, CRI, vit B12 def,   . Hypotension 04/14/2012  . CHF (congestive heart  failure)   . Atrial fibrillation     Past Surgical History  Procedure Date  .  Cardiac defibrillator placement 08/28/2004    [name]  . Colon surgery 1995    partial colectomy  . Coronary artery bypass graft 1996  . Aorta - bilateral femoral artery bypass graft 1999    [name] Early  . Nose surgery   . Insertion of pacing lead     New rate sensing pacing lead with removal of a previous implanted ICD and insertion of a device back in the pocket with defibrillation threshold testing  . Abdominal aortic aneurysm repair 1999    Ingrarenal repair/ graft  . Stents     Placed to the saphenous vein graft  . Radiofrequency ablation 2006    A flutter  . Esophagogastroduodenoscopy (egd) with esophageal dilation 05/28/2012    Procedure: ESOPHAGOGASTRODUODENOSCOPY (EGD) WITH ESOPHAGEAL DILATION;  Surgeon: Rachael Fee, MD;  Location: [facility];  Service: Endoscopy;  Laterality: N/A;    Family History  Problem Relation Age of Onset  . Stroke Mother 19  . Heart attack Mother     CVA  . Cancer Father 32    Lung  . Heart attack Father 16  . Lung cancer Father   . Coronary artery disease Other     2 of 5 siblings with CAD    History   Social History  . Marital Status: Married    Spouse Name: N/A    Number of Children: N/A  . Years of Education: N/A   Occupational History  . RETIRED     Mechanic   Social History Main Topics  . Smoking status: Former Smoker -- 1.0 packs/day for 30 years    Types: Cigarettes    Quit date: 05/23/2003  . Smokeless tobacco: Former Neurosurgeon    Types: Chew    Quit date: 05/14/2003     Comment: 80 pack a year history  . Alcohol Use: 0.6 oz/week    1 Cans of beer per week     Comment: occasionally  . Drug Use: No  . Sexually Active: Not on file   Other Topics Concern  . Not on file   Social History Narrative   Lives in [place] with his wife.Very involved in his church.Enjoys bluegrass music, going to [employer], spending time with  extended family.Used to play golf twice a week up until 2012.No alc/drug use.Former smoker, quit 2005.    ROS: Please see the HPI.  All other systems reviewed and negative.  PHYSICAL EXAM:  BP 140/82  Pulse 59  Ht 5' 11.5" (1.816 m)  Wt 163 lb (73.936 kg)  BMI 22.42 kg/m2  SpO2 99%  General: Friendly older gentleman, no distress Head:  Normocephalic and atraumatic. Neck: no JVD Lungs: Prolonged expiration without rales.  Heart: Normal S1 and S2.  No murmur, rubs or gallops.  Abdomen:  Normal bowel sounds; soft; non tender; no organomegaly  (has known AAA) Pulses: decreased pulses lower extremities.   Extremities: No clubbing or cyanosis. No edema. Neurologic: Alert and oriented x 3.  EKG:  SB.  Leftward axis.  Nonspecific T flattening.  QTc borderline.  Cannot exclude lateral ischemia.    ECHO:  Study Conclusions  - Left ventricle: The cavity size was mildly dilated. Wall thickness was normal. Systolic function was moderately to severely reduced. The estimated ejection fraction was in the range of 30% to 35%. There is akinesis of the entireapical myocardium. The distal 1/3 of the myocardium is akinetic and aneurysmal. No obvious apical thrombus with Definity. - Left atrium: The atrium was moderately  dilated. - Pulmonary arteries: PA peak pressure: 42mm Hg (S).  LAST CTA  CTA CHEST  Findings: Maximal degree of dilatation of the distal descending thoracic aorta just above, and at, the diaphragmatic hiatus is slightly larger compared to the prior CTA measurements. Current maximal AP diameter is 6.5 cm and maximal width is 6.8 cm. When making measurements today on the prior scan, comparable prior measurements were 6.0 cm and 6.3 cm respectively. Aneurysm growth is approximately 0.5 cm over the last year. Maximal caliber of the mid descending thoracic aorta is also slightly larger compared to the prior study now measuring 5.7 cm compared to approximately 5.5 cm at  comparable level previously.  Other measurements of the thoracic aorta are stable including proximal descending diameter of 3.9 cm and proximal arch diameter of 3.7 cm. Proximal great vessels continue to demonstrate no significant obstructive disease. There is no evidence of interval aortic dissection or rupture. No intramural hemorrhage identified.  No evidence of pulmonary infiltrate, nodule or adenopathy in the chest. Coronary bypass graft again visualized. No pleural or pericardial fluid. Stable appearance of the pacemaker.  Review of the MIP images confirms the above findings.  IMPRESSION: Interval enlargement of distal descending thoracic aortic aneurysmal disease as above with maximal diameter of 6.8 cm and estimated enlargement of approximately 0.5 cm in both transverse dimensions over the last year. Segment of aneurysmal dilatation of the mid descending thoracic aorta shows milder interval enlargement, now measuring 5.7 cm in diameter.  CTA ABDOMEN AND PELVIS  Findings: Aneurysmal dilatation of the native abdominal aorta just inferior to the renal arteries and proximal to an aortic graft is stable, measuring approximately 4.1 - 4.2 cm. Aortic graft and limbs are stable and normally patent with anastomosis of the right limb to the right external iliac artery and the left limb to the left common femoral artery.  Stable focal stenosis at the origin of the celiac axis approaching 70% with associated post stenotic dilatation. Stable mild stenosis at the origin of the superior mesenteric artery approaching 40%. Stable patency of bilateral single renal arteries without evidence of renal artery stenosis.  Stable enlargement of the prostate gland. Small left inguinal hernia contains fat. Stable parapelvic left renal cyst. Stable cysts and both left and right lobes of the liver. Stable calcified splenic artery without evidence of splenic artery aneurysm.  Review of the MIP  images confirms the above findings.  IMPRESSION: Stable dilatation of the native infrarenal abdominal aorta to 4.1 - 4.2 cm just superior to an aortic graft.  Original Report Authenticated By: Reola Calkins, M.D.       Last Resulted: 10/01/11 1:55 PM          ASSESSMENT AND PLAN:

## 2012-06-07 NOTE — Assessment & Plan Note (Signed)
No current angina.  Will continue to follow for now.  No new symtpoms.

## 2012-06-08 ENCOUNTER — Ambulatory Visit (INDEPENDENT_AMBULATORY_CARE_PROVIDER_SITE_OTHER): Payer: Medicare Other | Admitting: *Deleted

## 2012-06-08 DIAGNOSIS — I2589 Other forms of chronic ischemic heart disease: Secondary | ICD-10-CM

## 2012-06-08 LAB — ICD DEVICE OBSERVATION
BATTERY VOLTAGE: 2.65 V
BRDY-0002RV: 40 {beats}/min
CHARGE TIME: 10.42 s
RV LEAD IMPEDENCE ICD: 448 Ohm
RV LEAD THRESHOLD: 1 V
TZAT-0001FASTVT: 1
TZAT-0001SLOWVT: 1
TZAT-0001SLOWVT: 2
TZAT-0004FASTVT: 8
TZAT-0004SLOWVT: 8
TZAT-0004SLOWVT: 8
TZAT-0005FASTVT: 88 pct
TZAT-0018FASTVT: NEGATIVE
TZAT-0020SLOWVT: 1.6 ms
TZAT-0020SLOWVT: 1.6 ms
TZON-0003FASTVT: 240 ms
TZON-0003SLOWVT: 330 ms
TZON-0011AFLUTTER: 70
TZST-0001FASTVT: 2
TZST-0001FASTVT: 5
TZST-0001FASTVT: 6
TZST-0001SLOWVT: 3
TZST-0001SLOWVT: 5
TZST-0003FASTVT: 20 J
TZST-0003FASTVT: 35 J
TZST-0003FASTVT: 35 J
TZST-0003SLOWVT: 10 J
TZST-0003SLOWVT: 35 J

## 2012-06-08 NOTE — Progress Notes (Signed)
ICD check 

## 2012-06-19 ENCOUNTER — Encounter: Payer: Self-pay | Admitting: Internal Medicine

## 2012-06-29 ENCOUNTER — Other Ambulatory Visit: Payer: Self-pay | Admitting: Family Medicine

## 2012-06-29 ENCOUNTER — Encounter: Payer: Self-pay | Admitting: Vascular Surgery

## 2012-06-30 ENCOUNTER — Ambulatory Visit (INDEPENDENT_AMBULATORY_CARE_PROVIDER_SITE_OTHER): Payer: Medicare Other | Admitting: Vascular Surgery

## 2012-06-30 ENCOUNTER — Encounter: Payer: Self-pay | Admitting: Vascular Surgery

## 2012-06-30 VITALS — BP 119/70 | HR 61 | Ht 71.5 in | Wt 166.8 lb

## 2012-06-30 DIAGNOSIS — I712 Thoracic aortic aneurysm, without rupture: Secondary | ICD-10-CM

## 2012-06-30 NOTE — Progress Notes (Signed)
Patient presents today for continued discussion of his thoracoabdominal aneurysm. He saw Dr. Pattricia Boss at [facility] in July of 2013. He was quoted a 15-20% chance of rupture on an annual basis. He also had (discussion of the potential complications with open and endovascular stent graft repair. The patient is able to do his activities of daily living and does enjoy being outside. He is severely limited from a pulmonary standpoint. He is unable to walk from the office to the heart without significant shortness of breath. He has no symptoms referable to his aneurysm.  On physical exam a well-developed well-nourished gentleman acute distress. He does have palpable radial and femoral pulses. His abdomen is soft nontender with no evidence of incisional hernia and no evidence of aneurysm.  He does not have any new imaging studies. I again had a long discussion with Roberto [name] and his wife present. He has decided not to pursue repair of his thoracoabdominal aneurysm. He reports that he is with this decision understands that if he does have rupture that this will be fatal. I am for this decision as well. I feel that he would be at extremely high risk for repair with high risk for cardiac pulmonary or renal dysfunction. He will not to have ongoing follow up with myself of this he wishes further discussion. I appreciate Dr. Pattricia Boss willingness to see him in consultation

## 2012-06-30 NOTE — Telephone Encounter (Signed)
eScribe request for refill on FUROSEMIDE Last seen on - 05/29/12 Refill sent per [facility] refill protocol.

## 2012-07-03 ENCOUNTER — Ambulatory Visit (INDEPENDENT_AMBULATORY_CARE_PROVIDER_SITE_OTHER): Payer: Medicare Other

## 2012-07-03 DIAGNOSIS — I4891 Unspecified atrial fibrillation: Secondary | ICD-10-CM

## 2012-07-03 DIAGNOSIS — Z7901 Long term (current) use of anticoagulants: Secondary | ICD-10-CM

## 2012-07-15 ENCOUNTER — Ambulatory Visit (INDEPENDENT_AMBULATORY_CARE_PROVIDER_SITE_OTHER): Payer: Medicare Other | Admitting: Family Medicine

## 2012-07-15 ENCOUNTER — Encounter: Payer: Self-pay | Admitting: Family Medicine

## 2012-07-15 VITALS — BP 130/80 | HR 65 | Ht 70.5 in | Wt 163.0 lb

## 2012-07-15 DIAGNOSIS — E119 Type 2 diabetes mellitus without complications: Secondary | ICD-10-CM

## 2012-07-15 DIAGNOSIS — J449 Chronic obstructive pulmonary disease, unspecified: Secondary | ICD-10-CM

## 2012-07-15 LAB — BASIC METABOLIC PANEL
CO2: 31 mEq/L (ref 19–32)
Calcium: 9.4 mg/dL (ref 8.4–10.5)
Chloride: 105 mEq/L (ref 96–112)
Glucose, Bld: 112 mg/dL — ABNORMAL HIGH (ref 70–99)
Sodium: 142 mEq/L (ref 135–145)

## 2012-07-15 MED ORDER — PREDNISONE 20 MG PO TABS: ORAL_TABLET | ORAL | Status: DC

## 2012-07-15 MED ORDER — CYANOCOBALAMIN 1000 MCG/ML IJ SOLN
1000.0000 ug | Freq: Once | INTRAMUSCULAR | Status: AC
Start: 2012-07-15 — End: 2012-07-15
  Administered 2012-07-15: 1000 ug via INTRAMUSCULAR

## 2012-07-15 NOTE — Progress Notes (Signed)
OFFICE NOTE  07/17/2012  CC:  Chief Complaint  Patient presents with  . Follow-up    muliple issues  . B12 Injection    monthly     HPI: Patient is a 77 y.o. Caucasian male who is here for routine f/u but says he doesn't feel good. C/o feeling easily SOB, "washed out", sometimes very light wheezing, tired--this is usually brought on by walking around some but he says he gets it sometimes just sitting and doing nothing.  He gets significantly improved when he takes his pulmicort/alb mix in the mornings and evenings and when he uses his xopenex HFA.  He felt much improved while on most recent prednisone taper.  When this ended he started feeling bad again.  No chest pain. He has only occasional coughing.  No ST or HA.  Denies feeling swollen in abd area or legs.   No fevers.  PO intake is fine.  Denies depressed mood.  Pertinent PMH:  Past Medical History  Diagnosis Date  . CAD (coronary artery disease)     post CABG with prior percutaneous intervention of the saphenous vein graft with known saphenous vein graft disease.  Marland Kitchen AAA (abdominal aortic aneurysm)     a. CTA 09/2011: 4.1-4.2cm infrarenal AAA.  Marland Kitchen Aneurysm of thoracic aorta 1995    a. Rupture 1995 w/spontaneous resolution;  b. CTA 09/2011: 6.8cm dist, 5.7cm mid  . Popliteal artery aneurysm   . Myocardial infarction   . Ischemic cardiomyopathy   . Paroxysmal atrial fibrillation   . Hypertension   . Peripheral vascular disease   . Dyslipidemia   . Diabetes mellitus 2007    dx'd 2007 w/persisting FBS>125  . GERD (gastroesophageal reflux disease)   . Hiatal hernia   . Diverticular disease   . BPH (benign prostatic hypertrophy)     with hx of acute urinary retention (pt can self-cath)  . COPD (chronic obstructive pulmonary disease)     05/09/08 PFT FEV1 1.84 (55%), FVC 4.42 (95%), TLC 6.46 (92%), DLCO 72%, no BD response.  Overnight oximetry 10/2011 normal on RA.  Marland Kitchen Pneumonia 1/09  . Allergic rhinitis   . Shingles     X 2  episodes, both in left scapula area  . OSA (obstructive sleep apnea) 06/14/09    PSG RDI 17, PLMI 96, intolerant of CPAP or BPAP  . Chronic renal insufficiency     Baseline Cr 1.5  . Prostate cancer 07/2008    Low grade; watchful waiting with [name]  . Pulmonary nodule, left 03/02/2010    Left base; resolved on f/u CT  . Allergic rhinitis   . Carpal tunnel syndrome of left wrist 02/11/2011  . IMPLANTATION OF DEFIBRILLATOR, HX OF 09/02/2008    Medtronic 7232Cx Maximo V. Had a 6949 lead - failed and therapies turned off.  . IRON DEFICIENCY 06/08/2010    Hemoccult negative:  GI ([name]) recommended no invasive diagnostics--watchful waiting (2012).  . UNSPECIFIED ANEMIA 06/01/2010    Multifactorial: iron def, CRI, vit B12 def,   . Hypotension 04/14/2012  . CHF (congestive heart failure)   . Atrial fibrillation     MEDS:  Outpatient Prescriptions Prior to Visit  Medication Sig Dispense Refill  . acetaminophen (TYLENOL) 500 MG tablet Take 1,000 mg by mouth every 6 (six) hours as needed. For headache, pain or fever.      Marland Kitchen albuterol (PROVENTIL) (2.5 MG/3ML) 0.083% nebulizer solution Take 2.5 mg by nebulization every 6 (six) hours as needed. For shortness of  breath      . amiodarone (PACERONE) 200 MG tablet Take 200 mg by mouth every morning.       Marland Kitchen aspirin EC 81 MG tablet Take 81 mg by mouth every morning.      . budesonide (PULMICORT) 0.25 MG/2ML nebulizer solution Take 2 mLs (0.25 mg total) by nebulization 2 (two) times daily.  60 mL  5  . carvedilol (COREG) 3.125 MG tablet TAKE 1 TABLET BY MOUTH 2 TIMES A DAY  60 tablet  3  . cetirizine (ZYRTEC) 10 MG tablet Take 10 mg by mouth every morning.       . clotrimazole-betamethasone (LOTRISONE) cream Apply 1 application topically 2 (two) times daily as needed. For skin irritation      . cyanocobalamin 1000 MCG/ML injection Inject 1,000 mcg into the muscle every 30 (thirty) days.       Marland Kitchen desoximetasone (TOPICORT) 0.25 % cream Apply 1  application topically 2 (two) times daily as needed. For rash      . finasteride (PROSCAR) 5 MG tablet Take 5 mg by mouth at bedtime.       . fluticasone (FLONASE) 50 MCG/ACT nasal spray Place 2 sprays into the nose daily.  16 g  6  . furosemide (LASIX) 40 MG tablet TAKE 1 TABLET BY MOUTH EVERY DAY  30 tablet  3  . guaiFENesin (MUCINEX) 600 MG 12 hr tablet Take 1,200 mg by mouth 2 (two) times daily as needed. For congestion      . guaiFENesin-dextromethorphan (ROBITUSSIN DM) 100-10 MG/5ML syrup Take 5 mLs by mouth 3 (three) times daily as needed. For cough.      Marland Kitchen ipratropium (ATROVENT) 0.03 % nasal spray Place 2 sprays into the nose 3 (three) times daily as needed. For allergies.      . isosorbide mononitrate (IMDUR) 60 MG 24 hr tablet Take 60 mg by mouth every morning.   1 tablet  0  . levalbuterol (XOPENEX HFA) 45 MCG/ACT inhaler Inhale 1-2 puffs into the lungs every 4 (four) hours as needed. For shortness of breath  1 Inhaler  6  . levothyroxine (SYNTHROID, LEVOTHROID) 125 MCG tablet Take 62.5-125 mcg by mouth every other day. He alternates daily with a whole tablet ( ) or a half a tablet (6.33mcg).      Marland Kitchen lithium carbonate 300 MG capsule Take 300 mg by mouth 2 (two) times daily with a meal.      . nitroGLYCERIN (NITROSTAT) 0.3 MG SL tablet Place 1 tablet (0.3 mg total) under the tongue every 5 (five) minutes as needed. For chest pain  25 tablet  2  . potassium chloride (KLOR-CON M10) 10 MEQ tablet Take 10 mEq by mouth daily.      . ranitidine (ZANTAC) 150 MG tablet Take 150 mg by mouth 2 (two) times daily.      . sitaGLIPtin (JANUVIA) 100 MG tablet Take 100 mg by mouth every morning.      . Tamsulosin HCl (FLOMAX) 0.4 MG CAPS Take 0.8 mg by mouth every morning.       . tiotropium (SPIRIVA) 18 MCG inhalation capsule Place 18 mcg into inhaler and inhale every morning.       . warfarin (COUMADIN) 5 MG tablet Take 2.5-5 mg by mouth every evening. He takes one tablet (5mg ) on Monday and Friday  and half a tablet (2.5mg ) on Tuesday, Wednesday, Thursday, Saturday and Sunday.      . predniSONE (DELTASONE) 20 MG tablet 1 tab po qd x  5d, then 1/2 tab po qd x 6d, then 1/2 tab po qod x 6 doses, then stop  11 tablet  0   No facility-administered medications prior to visit.    PE: Blood pressure 130/80, pulse 65, height 5' 10.5" (1.791 m), weight 163 lb (73.936 kg). Oxygen sat at rest on RA 96%.  This did not change with ambulation about 60 steps around our clinic today. Gen: alert, NAD.  He looks tired.  Oriented x 4. AFFECT: pleasant, lucid thought and speech. CV: RRR, no m/r/g.   LUNGS: CTA bilat, nonlabored resps, good aeration in all lung fields. EXT: no clubbing, cyanosis, or edema.  He has bronze/grey discoloration of forearms (amiodarone effect? + warfarin).  No cyanosis  IMPRESSION AND PLAN:  COPD Gradually worsening baseline functioning vs acute flare once again.   Will do another steroid taper: pred 20 qd x 5, then 10mg  qd x 5, then 10 mg qod x 6d. Continue pulmicort mixed with albuterol nebs bid, with use of xopenex HFA in between these two alb nebs. Will likely add daliresp soon, but I want to make sure it is going to be ok with all the meds he is on--will check on this and add on if ok. I looked back at his relatively recent CXR and there was no sign of interstitial lung dz that would indicate any possibility of amiodarone toxicity playing a role in his dyspnea complaints of late.   Will check lithium level, bmet, and Hb A1c today to make sure these parameters are all ok given the way he has been feeling lately.   An After Visit Summary was printed and given to the patient.  FOLLOW UP: 10d

## 2012-07-17 NOTE — Assessment & Plan Note (Addendum)
Gradually worsening baseline functioning vs acute flare once again.   Will do another steroid taper: pred 20 qd x 5, then 10mg  qd x 5, then 10 mg qod x 6d. Continue pulmicort mixed with albuterol nebs bid, with use of xopenex HFA in between these two alb nebs. Will likely add daliresp soon, but I want to make sure it is going to be ok with all the meds he is on--will check on this and add on if ok. I looked back at his relatively recent CXR and there was no sign of interstitial lung dz that would indicate any possibility of amiodarone toxicity playing a role in his dyspnea complaints of late.   Will check lithium level, bmet, and Hb A1c today to make sure these parameters are all ok given the way he has been feeling lately.

## 2012-07-24 ENCOUNTER — Ambulatory Visit (INDEPENDENT_AMBULATORY_CARE_PROVIDER_SITE_OTHER): Payer: Medicare Other | Admitting: *Deleted

## 2012-07-27 ENCOUNTER — Ambulatory Visit (INDEPENDENT_AMBULATORY_CARE_PROVIDER_SITE_OTHER): Payer: Medicare Other | Admitting: Family Medicine

## 2012-07-27 MED ORDER — POLYETHYLENE GLYCOL 3350 17 GM/SCOOP PO POWD: ORAL | Status: DC

## 2012-07-27 NOTE — Progress Notes (Signed)
  Subjective:    Patient ID: Roberto [name], male    DOB: [birth year], 77 y.o.   MRN: [id number]  HPI    Review of Systems     Objective:   Physical Exam        Assessment & Plan:  Talked to pt on phone.   Due to slick road conditions he will reschedule his f/u visit. He said he is mildly improved compared to when I saw him last week.  He is constipated and asked for rx. I sent eRx for miralax to his pharmacy.  He will call back and reschedule this afternoon's appt.--PM

## 2012-07-29 ENCOUNTER — Ambulatory Visit (INDEPENDENT_AMBULATORY_CARE_PROVIDER_SITE_OTHER): Payer: Medicare Other | Admitting: *Deleted

## 2012-07-29 LAB — POCT INR: INR: 2.9

## 2012-08-05 ENCOUNTER — Ambulatory Visit (INDEPENDENT_AMBULATORY_CARE_PROVIDER_SITE_OTHER): Payer: Medicare Other | Admitting: Family Medicine

## 2012-08-05 ENCOUNTER — Encounter: Payer: Self-pay | Admitting: Family Medicine

## 2012-08-05 VITALS — BP 128/78 | HR 66 | Temp 99.2°F | Resp 14 | Wt 155.0 lb

## 2012-08-05 DIAGNOSIS — G4700 Insomnia, unspecified: Secondary | ICD-10-CM

## 2012-08-05 DIAGNOSIS — R5383 Other fatigue: Secondary | ICD-10-CM

## 2012-08-05 DIAGNOSIS — R6881 Early satiety: Secondary | ICD-10-CM

## 2012-08-05 DIAGNOSIS — R634 Abnormal weight loss: Secondary | ICD-10-CM

## 2012-08-05 DIAGNOSIS — R5381 Other malaise: Secondary | ICD-10-CM

## 2012-08-05 DIAGNOSIS — R630 Anorexia: Secondary | ICD-10-CM

## 2012-08-05 LAB — LIPASE: Lipase: 18 U/L (ref 11.0–59.0)

## 2012-08-05 LAB — CBC WITH DIFFERENTIAL/PLATELET
Basophils Relative: 0.2 % (ref 0.0–3.0)
Eosinophils Relative: 0.7 % (ref 0.0–5.0)
HCT: 37.8 % — ABNORMAL LOW (ref 39.0–52.0)
MCV: 86.1 fl (ref 78.0–100.0)
Monocytes Absolute: 0.5 10*3/uL (ref 0.1–1.0)
Neutrophils Relative %: 83.4 % — ABNORMAL HIGH (ref 43.0–77.0)
RBC: 4.39 Mil/uL (ref 4.22–5.81)
WBC: 9.4 10*3/uL (ref 4.5–10.5)

## 2012-08-05 LAB — COMPREHENSIVE METABOLIC PANEL
Albumin: 3.8 g/dL (ref 3.5–5.2)
Alkaline Phosphatase: 61 U/L (ref 39–117)
BUN: 19 mg/dL (ref 6–23)
Calcium: 10 mg/dL (ref 8.4–10.5)
Glucose, Bld: 106 mg/dL — ABNORMAL HIGH (ref 70–99)
Potassium: 4 mEq/L (ref 3.5–5.1)

## 2012-08-05 LAB — SEDIMENTATION RATE: Sed Rate: 13 mm/hr (ref 0–22)

## 2012-08-05 MED ORDER — TRAZODONE HCL 50 MG PO TABS: ORAL_TABLET | ORAL | Status: DC

## 2012-08-05 NOTE — Progress Notes (Signed)
OFFICE NOTE  08/06/2012  CC:  Chief Complaint  Patient presents with  . Follow-up     HPI: Patient is a 77 y.o. Caucasian male who is here for 3 wk f/u a nonspecific malaise/intermittent DOE illness that has been lingering lately.  I treated him with a taper of steroids hoping this was slight AECOPD but this doesn't seem to have made a big difference.  Denies fevers, productive cough, chest pain, palpitations, or dizziness.  He still says he feels like he gives out quickly whenever he tries to do anything like walk for more than 10-12 steps.  However, he admits to feeling very frustrated with life, esp with cold weather lately not allowing him to get out and do anything--so part of this is psychological.  He and wife are concerned about his gradually worsened poor appetite and early satiety over the last 2 mo or so.  Denies n/v or abd pain.  Has GER with spicy foods, as per his usual.  He does seem to admit to an appetite for sweets/dessert foods.  Says bowels moving ok with use of miralax.  Also, insomnia lately.  Often either has trouble initiating sleep or wakes up after only a few hours of sleep and can't get back to sleep due to his mind constantly going over and over the things he wants to get done but can't get done for one reason or another.  Denies RLS or pain inhibiting sleep.     Pertinent PMH:  Past Medical History  Diagnosis Date  . CAD (coronary artery disease)     post CABG with prior percutaneous intervention of the saphenous vein graft with known saphenous vein graft disease.  Marland Kitchen AAA (abdominal aortic aneurysm)     a. CTA 09/2011: 4.1-4.2cm infrarenal AAA.  Marland Kitchen Aneurysm of thoracic aorta 1995    a. Rupture 1995 w/spontaneous resolution;  b. CTA 09/2011: 6.8cm dist, 5.7cm mid  . Popliteal artery aneurysm   . Myocardial infarction   . Ischemic cardiomyopathy     10/2011 ECHO: EF 30-35%, apical akinesis  . Paroxysmal atrial fibrillation   . Hypertension   . Peripheral  vascular disease   . Dyslipidemia   . Diabetes mellitus 2007    dx'd 2007 w/persisting FBS>125  . GERD (gastroesophageal reflux disease)   . Hiatal hernia   . Diverticular disease   . BPH (benign prostatic hypertrophy)     with hx of acute urinary retention (pt can self-cath)  . COPD (chronic obstructive pulmonary disease)     05/09/08 PFT FEV1 1.84 (55%), FVC 4.42 (95%), TLC 6.46 (92%), DLCO 72%, no BD response.  Overnight oximetry 10/2011 normal on RA.  Marland Kitchen Pneumonia 1/09  . Allergic rhinitis   . Shingles     X 2 episodes, both in left scapula area  . OSA (obstructive sleep apnea) 06/14/09    PSG RDI 17, PLMI 96, intolerant of CPAP or BPAP  . Chronic renal insufficiency     Baseline Cr 1.5  . Prostate cancer 07/2008    Low grade; watchful waiting with [name]  . Pulmonary nodule, left 03/02/2010    Left base; resolved on f/u CT  . Allergic rhinitis   . Carpal tunnel syndrome of left wrist 02/11/2011  . IMPLANTATION OF DEFIBRILLATOR, HX OF 09/02/2008    Medtronic 7232Cx Maximo V. Had a 6949 lead - failed and therapies turned off.  . IRON DEFICIENCY 06/08/2010    Hemoccult negative:  GI ([name]) recommended no invasive diagnostics--watchful  waiting (2012).  . UNSPECIFIED ANEMIA 06/01/2010    Multifactorial: iron def, CRI, vit B12 def,   . Hypotension 04/14/2012  . CHF (congestive heart failure)   . Atrial fibrillation    Past Surgical History  Procedure Laterality Date  . Cardiac defibrillator placement  08/28/2004    [name]  . Colon surgery  1995    partial colectomy  . Coronary artery bypass graft  1996  . Aorta - bilateral femoral artery bypass graft  1999    [name] Early  . Nose surgery    . Insertion of pacing lead      New rate sensing pacing lead with removal of a previous implanted ICD and insertion of a device back in the pocket with defibrillation threshold testing  . Abdominal aortic aneurysm repair  1999    Ingrarenal repair/ graft  . Stents       Placed to the saphenous vein graft  . Radiofrequency ablation  2006    A flutter  . Esophagogastroduodenoscopy (egd) with esophageal dilation  05/28/2012    Procedure: ESOPHAGOGASTRODUODENOSCOPY (EGD) WITH ESOPHAGEAL DILATION;  Surgeon: Rachael Fee, MD;  Location: [facility];  Service: Endoscopy;  Laterality: N/A;    MEDS:  Outpatient Prescriptions Prior to Visit  Medication Sig Dispense Refill  . acetaminophen (TYLENOL) 500 MG tablet Take 1,000 mg by mouth every 6 (six) hours as needed. For headache, pain or fever.      Marland Kitchen albuterol (PROVENTIL) (2.5 MG/3ML) 0.083% nebulizer solution Take 2.5 mg by nebulization every 6 (six) hours as needed. For shortness of breath      . amiodarone (PACERONE) 200 MG tablet Take 200 mg by mouth every morning.       Marland Kitchen aspirin EC 81 MG tablet Take 81 mg by mouth every morning.      . budesonide (PULMICORT) 0.25 MG/2ML nebulizer solution Take 2 mLs (0.25 mg total) by nebulization 2 (two) times daily.  60 mL  5  . carvedilol (COREG) 3.125 MG tablet TAKE 1 TABLET BY MOUTH 2 TIMES A DAY  60 tablet  3  . cetirizine (ZYRTEC) 10 MG tablet Take 10 mg by mouth every morning.       . clotrimazole-betamethasone (LOTRISONE) cream Apply 1 application topically 2 (two) times daily as needed. For skin irritation      . cyanocobalamin 1000 MCG/ML injection Inject 1,000 mcg into the muscle every 30 (thirty) days.       Marland Kitchen desoximetasone (TOPICORT) 0.25 % cream Apply 1 application topically 2 (two) times daily as needed. For rash      . finasteride (PROSCAR) 5 MG tablet Take 5 mg by mouth at bedtime.       . fluticasone (FLONASE) 50 MCG/ACT nasal spray Place 2 sprays into the nose daily.  16 g  6  . furosemide (LASIX) 40 MG tablet TAKE 1 TABLET BY MOUTH EVERY DAY  30 tablet  3  . guaiFENesin (MUCINEX) 600 MG 12 hr tablet Take 1,200 mg by mouth 2 (two) times daily as needed. For congestion      . guaiFENesin-dextromethorphan (ROBITUSSIN DM) 100-10 MG/5ML syrup Take 5 mLs  by mouth 3 (three) times daily as needed. For cough.      Marland Kitchen ipratropium (ATROVENT) 0.03 % nasal spray Place 2 sprays into the nose 3 (three) times daily as needed. For allergies.      . isosorbide mononitrate (IMDUR) 60 MG 24 hr tablet Take 60 mg by mouth every morning.  1 tablet  0  . levalbuterol (XOPENEX HFA) 45 MCG/ACT inhaler Inhale 1-2 puffs into the lungs every 4 (four) hours as needed. For shortness of breath  1 Inhaler  6  . levothyroxine (SYNTHROID, LEVOTHROID) 125 MCG tablet Take 62.5-125 mcg by mouth every other day. He alternates daily with a whole tablet ( ) or a half a tablet (6.53mcg).      Marland Kitchen lithium carbonate 300 MG capsule Take 300 mg by mouth 2 (two) times daily with a meal.      . nitroGLYCERIN (NITROSTAT) 0.3 MG SL tablet Place 1 tablet (0.3 mg total) under the tongue every 5 (five) minutes as needed. For chest pain  25 tablet  2  . polyethylene glycol powder (GLYCOLAX/MIRALAX) powder 1 capful one to three times per day as needed for constipation  255 g  6  . potassium chloride (KLOR-CON M10) 10 MEQ tablet Take 10 mEq by mouth daily.      . ranitidine (ZANTAC) 150 MG tablet Take 150 mg by mouth 2 (two) times daily.      . sitaGLIPtin (JANUVIA) 100 MG tablet Take 100 mg by mouth every morning.      . Tamsulosin HCl (FLOMAX) 0.4 MG CAPS Take 0.8 mg by mouth every morning.       . tiotropium (SPIRIVA) 18 MCG inhalation capsule Place 18 mcg into inhaler and inhale every morning.       . warfarin (COUMADIN) 5 MG tablet Take 2.5-5 mg by mouth every evening. He takes one tablet (5mg ) on Monday and Friday and half a tablet (2.5mg ) on Tuesday, Wednesday, Thursday, Saturday and Sunday.      . predniSONE (DELTASONE) 20 MG tablet 1 tab po qd x 5d, then 1/2 tab po qd x 6d, then 1/2 tab po qod x 6 doses, then stop  11 tablet  0   No facility-administered medications prior to visit.    PE: Blood pressure 128/78, pulse 66, temperature 99.2 F (37.3 C), temperature source Temporal,  resp. rate 14, weight 155 lb (70.308 kg), SpO2 99.00%. Gen: Alert, well appearing.  Patient is oriented to person, place, time, and situation. ENT: Ears: EACs clear, normal epithelium.  TMs with good light reflex and landmarks bilaterally.  Eyes: no injection, icteris, swelling, or exudate.  EOMI, PERRLA. Nose: no drainage or turbinate edema/swelling.  No injection or focal lesion.  Mouth: lips without lesion/swelling.  Oral mucosa pink and moist.  Dentition intact and without obvious caries or gingival swelling.  Oropharynx without erythema, exudate, or swelling.  CV: RRR LUNGS: CTA bilat, nonlabored resps. ABD: soft, NT/ND, no HSM or mass. EXT: no clubbing, cyanosis, or edema.    IMPRESSION AND PLAN:  Anorexia With early satiety and gradual weight loss (16 lb over the last 6 mo). Will check lipase, CMET, CBC, and ESR today. Recent EGD showed no abnormality except mild GE jxn stricture, so this was dilated. Will have to keep in mind GB dz for him.    Fatigue Nonspecific symptoms for the most part.  The most distinct symptoms are respiratory, which I think are at baseline for him--combo of severe COPD and chronic systolic heart failure.  I don't detect any acute problem occurring.  Insomnia Trazodone 50mg , 1-2 qhs prn.  Therapeutic expectations and side effect profile of medication discussed today.  Patient's questions answered.    An After Visit Summary was printed and given to the patient.  FOLLOW UP: 24mo

## 2012-08-06 ENCOUNTER — Encounter: Payer: Self-pay | Admitting: Family Medicine

## 2012-08-06 DIAGNOSIS — R6881 Early satiety: Secondary | ICD-10-CM | POA: Insufficient documentation

## 2012-08-06 DIAGNOSIS — R634 Abnormal weight loss: Secondary | ICD-10-CM | POA: Insufficient documentation

## 2012-08-06 DIAGNOSIS — R630 Anorexia: Secondary | ICD-10-CM | POA: Insufficient documentation

## 2012-08-06 NOTE — Assessment & Plan Note (Addendum)
With early satiety and gradual weight loss (16 lb over the last 6 mo). Will check lipase, CMET, CBC, and ESR today. Recent EGD showed no abnormality except mild GE jxn stricture, so this was dilated. Will have to keep in mind GB dz for him.

## 2012-08-06 NOTE — Assessment & Plan Note (Signed)
Trazodone 50mg , 1-2 qhs prn.  Therapeutic expectations and side effect profile of medication discussed today.  Patient's questions answered.

## 2012-08-06 NOTE — Assessment & Plan Note (Signed)
Nonspecific symptoms for the most part.  The most distinct symptoms are respiratory, which I think are at baseline for him--combo of severe COPD and chronic systolic heart failure.  I don't detect any acute problem occurring.

## 2012-08-17 ENCOUNTER — Encounter: Payer: Self-pay | Admitting: Family Medicine

## 2012-08-17 ENCOUNTER — Ambulatory Visit (INDEPENDENT_AMBULATORY_CARE_PROVIDER_SITE_OTHER): Payer: Medicare Other | Admitting: Family Medicine

## 2012-08-17 ENCOUNTER — Ambulatory Visit (INDEPENDENT_AMBULATORY_CARE_PROVIDER_SITE_OTHER)
Admission: RE | Admit: 2012-08-17 | Discharge: 2012-08-17 | Disposition: A | Discharge status: Home / Self Care | Payer: Medicare Other | Source: Ambulatory Visit | Attending: Family Medicine | Admitting: Family Medicine

## 2012-08-17 VITALS — BP 114/76 | HR 76 | Temp 99.1°F | Ht 70.5 in | Wt 159.0 lb

## 2012-08-17 DIAGNOSIS — R0609 Other forms of dyspnea: Secondary | ICD-10-CM

## 2012-08-17 DIAGNOSIS — J069 Acute upper respiratory infection, unspecified: Secondary | ICD-10-CM

## 2012-08-22 ENCOUNTER — Encounter: Payer: Self-pay | Admitting: Family Medicine

## 2012-08-22 NOTE — Progress Notes (Signed)
OFFICE NOTE  08/22/2012  CC:  Chief Complaint  Patient presents with  . Follow-up    shortness of breath     HPI: Patient is a 77 y.o. Caucasian male who is here for nasal congestion, onset 2 d/a, no cough, no ST, no fever. Question of change in baseline DOE, but no chest pain.  He sometimes can walk around his house fine, without too much SOB, then says at other times he just "gives out" after walking from chair to bathroom. Has not been on any prednisone now for a couple of weeks.  Pertinent PMH:  Past Medical History  Diagnosis Date  . CAD (coronary artery disease)     post CABG with prior percutaneous intervention of the saphenous vein graft with known saphenous vein graft disease.  Marland Kitchen AAA (abdominal aortic aneurysm)     a. CTA 09/2011: 4.1-4.2cm infrarenal AAA.  Marland Kitchen Aneurysm of thoracic aorta 1995    a. Rupture 1995 w/spontaneous resolution;  b. CTA 09/2011: 6.8cm dist, 5.7cm mid  . Popliteal artery aneurysm   . Myocardial infarction   . Ischemic cardiomyopathy     10/2011 ECHO: EF 30-35%, apical akinesis  . Paroxysmal atrial fibrillation   . Hypertension   . Peripheral vascular disease   . Dyslipidemia   . Diabetes mellitus 2007    dx'd 2007 w/persisting FBS>125  . GERD (gastroesophageal reflux disease)   . Hiatal hernia   . Diverticular disease   . BPH (benign prostatic hypertrophy)     with hx of acute urinary retention (pt can self-cath)  . COPD (chronic obstructive pulmonary disease)     05/09/08 PFT FEV1 1.84 (55%), FVC 4.42 (95%), TLC 6.46 (92%), DLCO 72%, no BD response.  Overnight oximetry 10/2011 normal on RA.  Marland Kitchen Pneumonia 1/09  . Allergic rhinitis   . Shingles     X 2 episodes, both in left scapula area  . OSA (obstructive sleep apnea) 06/14/09    PSG RDI 17, PLMI 96, intolerant of CPAP or BPAP  . Chronic renal insufficiency     Baseline Cr 1.5  . Prostate cancer 07/2008    Low grade; watchful waiting with [name]  . Pulmonary nodule, left 03/02/2010    Left base; resolved on f/u CT  . Allergic rhinitis   . Carpal tunnel syndrome of left wrist 02/11/2011  . IMPLANTATION OF DEFIBRILLATOR, HX OF 09/02/2008    Medtronic 7232Cx Maximo V. Had a 6949 lead - failed and therapies turned off.  . IRON DEFICIENCY 06/08/2010    Hemoccult negative:  GI ([name]) recommended no invasive diagnostics--watchful waiting (2012).  . UNSPECIFIED ANEMIA 06/01/2010    Multifactorial: iron def, CRI, vit B12 def,   . Hypotension 04/14/2012  . CHF (congestive heart failure)   . Atrial fibrillation     MEDS:  Outpatient Prescriptions Prior to Visit  Medication Sig Dispense Refill  . acetaminophen (TYLENOL) 500 MG tablet Take 1,000 mg by mouth every 6 (six) hours as needed. For headache, pain or fever.      Marland Kitchen albuterol (PROVENTIL) (2.5 MG/3ML) 0.083% nebulizer solution Take 2.5 mg by nebulization every 6 (six) hours as needed. For shortness of breath      . amiodarone (PACERONE) 200 MG tablet Take 200 mg by mouth every morning.       Marland Kitchen aspirin EC 81 MG tablet Take 81 mg by mouth every morning.      . budesonide (PULMICORT) 0.25 MG/2ML nebulizer solution Take 2 mLs (0.25 mg total)  by nebulization 2 (two) times daily.  60 mL  5  . carvedilol (COREG) 3.125 MG tablet TAKE 1 TABLET BY MOUTH 2 TIMES A DAY  60 tablet  3  . cetirizine (ZYRTEC) 10 MG tablet Take 10 mg by mouth every morning.       . clotrimazole-betamethasone (LOTRISONE) cream Apply 1 application topically 2 (two) times daily as needed. For skin irritation      . cyanocobalamin 1000 MCG/ML injection Inject 1,000 mcg into the muscle every 30 (thirty) days.       Marland Kitchen desoximetasone (TOPICORT) 0.25 % cream Apply 1 application topically 2 (two) times daily as needed. For rash      . finasteride (PROSCAR) 5 MG tablet Take 5 mg by mouth at bedtime.       . fluticasone (FLONASE) 50 MCG/ACT nasal spray Place 2 sprays into the nose daily.  16 g  6  . furosemide (LASIX) 40 MG tablet TAKE 1 TABLET BY MOUTH EVERY DAY   30 tablet  3  . guaiFENesin (MUCINEX) 600 MG 12 hr tablet Take 1,200 mg by mouth 2 (two) times daily as needed. For congestion      . guaiFENesin-dextromethorphan (ROBITUSSIN DM) 100-10 MG/5ML syrup Take 5 mLs by mouth 3 (three) times daily as needed. For cough.      Marland Kitchen ipratropium (ATROVENT) 0.03 % nasal spray Place 2 sprays into the nose 3 (three) times daily as needed. For allergies.      . isosorbide mononitrate (IMDUR) 60 MG 24 hr tablet Take 60 mg by mouth every morning.   1 tablet  0  . levalbuterol (XOPENEX HFA) 45 MCG/ACT inhaler Inhale 1-2 puffs into the lungs every 4 (four) hours as needed. For shortness of breath  1 Inhaler  6  . levothyroxine (SYNTHROID, LEVOTHROID) 125 MCG tablet Take 62.5-125 mcg by mouth every other day. He alternates daily with a whole tablet ( ) or a half a tablet (6.24mcg).      Marland Kitchen lithium carbonate 300 MG capsule Take 300 mg by mouth 2 (two) times daily with a meal.      . nitroGLYCERIN (NITROSTAT) 0.3 MG SL tablet Place 1 tablet (0.3 mg total) under the tongue every 5 (five) minutes as needed. For chest pain  25 tablet  2  . polyethylene glycol powder (GLYCOLAX/MIRALAX) powder 1 capful one to three times per day as needed for constipation  255 g  6  . potassium chloride (KLOR-CON M10) 10 MEQ tablet Take 10 mEq by mouth daily.      . ranitidine (ZANTAC) 150 MG tablet Take 150 mg by mouth 2 (two) times daily.      . sitaGLIPtin (JANUVIA) 100 MG tablet Take 100 mg by mouth every morning.      . Tamsulosin HCl (FLOMAX) 0.4 MG CAPS Take 0.8 mg by mouth every morning.       . tiotropium (SPIRIVA) 18 MCG inhalation capsule Place 18 mcg into inhaler and inhale every morning.       . traZODone (DESYREL) 50 MG tablet 1-2 tabs po qhs  60 tablet  3  . warfarin (COUMADIN) 5 MG tablet Take 2.5-5 mg by mouth every evening. He takes one tablet (5mg ) on Monday and Friday and half a tablet (2.5mg ) on Tuesday, Wednesday, Thursday, Saturday and Sunday.      . predniSONE  (DELTASONE) 20 MG tablet 1 tab po qd x 5d, then 1/2 tab po qd x 6d, then 1/2 tab po qod x 6 doses,  then stop  11 tablet  0  . traZODone (DESYREL) 50 MG tablet 1-2 tabs po qhs  30 tablet  1  . traZODone (DESYREL) 50 MG tablet 1-2 tabs po qhs prn insomnia  60 tablet  5   No facility-administered medications prior to visit.    PE: Blood pressure 114/76, pulse 76, temperature 99.1 F (37.3 C), temperature source Temporal, height 5' 10.5" (1.791 m), weight 159 lb (72.122 kg), SpO2 93.00%. VS: noted--normal. Gen: alert, NAD, NONTOXIC APPEARING. HEENT: eyes without injection, drainage, or swelling.  Ears: EACs clear, TMs with normal light reflex and landmarks.  Nose: Clear rhinorrhea, with some dried, crusty exudate adherent to mildly edematous mucosa.  No purulent d/c.  No paranasal sinus TTP.  No facial swelling.  Throat and mouth without focal lesion.  No pharyngial swelling, erythema, or exudate.   Neck: supple, no LAD.   LUNGS: CTA bilat, nonlabored resps.   CV: RRR, no m/r/g. EXT: no c/c/e SKIN: no rash  IMPRESSION AND PLAN:  Viral URI. Pt with chronic systolic HF + COPD, very tenuous respiratory function. Will check CXR today.  FOLLOW UP: prn

## 2012-08-26 ENCOUNTER — Ambulatory Visit (INDEPENDENT_AMBULATORY_CARE_PROVIDER_SITE_OTHER): Payer: Medicare Other | Admitting: *Deleted

## 2012-08-26 ENCOUNTER — Other Ambulatory Visit: Payer: Self-pay | Admitting: Family Medicine

## 2012-08-26 DIAGNOSIS — I4891 Unspecified atrial fibrillation: Secondary | ICD-10-CM

## 2012-08-26 DIAGNOSIS — Z7901 Long term (current) use of anticoagulants: Secondary | ICD-10-CM

## 2012-08-26 NOTE — Telephone Encounter (Signed)
eScribe request for refill on JANUVIA Last filled - 02/16/13, #30 X 5 Last seen on - 08/17/12 Follow up - as directed RX sent

## 2012-08-27 ENCOUNTER — Ambulatory Visit (INDEPENDENT_AMBULATORY_CARE_PROVIDER_SITE_OTHER): Payer: Medicare Other | Admitting: Family Medicine

## 2012-08-27 ENCOUNTER — Encounter: Payer: Self-pay | Admitting: Family Medicine

## 2012-08-27 VITALS — BP 120/68 | HR 63 | Temp 98.0°F | Ht 70.0 in | Wt 156.8 lb

## 2012-08-27 DIAGNOSIS — J449 Chronic obstructive pulmonary disease, unspecified: Secondary | ICD-10-CM

## 2012-08-27 DIAGNOSIS — F329 Major depressive disorder, single episode, unspecified: Secondary | ICD-10-CM

## 2012-08-27 DIAGNOSIS — J4489 Other specified chronic obstructive pulmonary disease: Secondary | ICD-10-CM

## 2012-08-27 DIAGNOSIS — F3289 Other specified depressive episodes: Secondary | ICD-10-CM

## 2012-08-27 DIAGNOSIS — E538 Deficiency of other specified B group vitamins: Secondary | ICD-10-CM

## 2012-08-27 MED ORDER — MIRTAZAPINE 15 MG PO TABS: 15.0000 mg | ORAL_TABLET | Freq: Every day | ORAL | Status: DC

## 2012-08-27 MED ORDER — CYANOCOBALAMIN 1000 MCG/ML IJ SOLN
1000.0000 ug | Freq: Once | INTRAMUSCULAR | Status: AC
  Administered 2012-08-27: 1000 ug via INTRAMUSCULAR

## 2012-08-27 NOTE — Progress Notes (Signed)
OFFICE NOTE  08/27/2012  CC:  Chief Complaint  Patient presents with  . Follow-up    3 month     HPI: Patient is a 77 y.o. Caucasian male who is here for routine chronic illness/polypharmacy follow up. I saw him 1 wk ago for URI sx's that had been present for a couple of days.  A CXR at that time was stable. He continues to struggle with chronic feeling of fatigue/malaise, DOE.  However, symptoms wax and wane, and he admits that nothing acute seems to feel wrong, simply "no get up and go".  Denies chest pain or heaviness, nausea, abdominal pain, or melena/hematochezia. He says he doesn't feel hungry and he can't sleep at night--these two things bother him and his wife the most right now.   Pertinent PMH:  Past Medical History  Diagnosis Date  . CAD (coronary artery disease)     post CABG with prior percutaneous intervention of the saphenous vein graft with known saphenous vein graft disease.  Marland Kitchen AAA (abdominal aortic aneurysm)     a. CTA 09/2011: 4.1-4.2cm infrarenal AAA.  Marland Kitchen Aneurysm of thoracic aorta 1995    a. Rupture 1995 w/spontaneous resolution;  b. CTA 09/2011: 6.8cm dist, 5.7cm mid  . Popliteal artery aneurysm   . Myocardial infarction   . Ischemic cardiomyopathy     10/2011 ECHO: EF 30-35%, apical akinesis  . Paroxysmal atrial fibrillation   . Hypertension   . Peripheral vascular disease   . Dyslipidemia   . Diabetes mellitus 2007    dx'd 2007 w/persisting FBS>125  . GERD (gastroesophageal reflux disease)   . Hiatal hernia   . Diverticular disease   . BPH (benign prostatic hypertrophy)     with hx of acute urinary retention (pt can self-cath)  . COPD (chronic obstructive pulmonary disease)     05/09/08 PFT FEV1 1.84 (55%), FVC 4.42 (95%), TLC 6.46 (92%), DLCO 72%, no BD response.  Overnight oximetry 10/2011 normal on RA.  Marland Kitchen Pneumonia 1/09  . Allergic rhinitis   . Shingles     X 2 episodes, both in left scapula area  . OSA (obstructive sleep apnea) 06/14/09    PSG RDI  17, PLMI 96, intolerant of CPAP or BPAP  . Chronic renal insufficiency     Baseline Cr 1.5  . Prostate cancer 07/2008    Low grade; watchful waiting with [name]  . Pulmonary nodule, left 03/02/2010    Left base; resolved on f/u CT  . Allergic rhinitis   . Carpal tunnel syndrome of left wrist 02/11/2011  . IMPLANTATION OF DEFIBRILLATOR, HX OF 09/02/2008    Medtronic 7232Cx Maximo V. Had a 6949 lead - failed and therapies turned off.  . IRON DEFICIENCY 06/08/2010    Hemoccult negative:  GI ([name]) recommended no invasive diagnostics--watchful waiting (2012).  . UNSPECIFIED ANEMIA 06/01/2010    Multifactorial: iron def, CRI, vit B12 def,   . Hypotension 04/14/2012  . CHF (congestive heart failure)   . Atrial fibrillation    Past Surgical History  Procedure Laterality Date  . Cardiac defibrillator placement  08/28/2004    [name]  . Colon surgery  1995    partial colectomy  . Coronary artery bypass graft  1996  . Aorta - bilateral femoral artery bypass graft  1999    [name] Early  . Nose surgery    . Insertion of pacing lead      New rate sensing pacing lead with removal of a  previous implanted ICD and insertion of a device back in the pocket with defibrillation threshold testing  . Abdominal aortic aneurysm repair  1999    Ingrarenal repair/ graft  . Stents      Placed to the saphenous vein graft  . Radiofrequency ablation  2006    A flutter  . Esophagogastroduodenoscopy (egd) with esophageal dilation  05/28/2012    Procedure: ESOPHAGOGASTRODUODENOSCOPY (EGD) WITH ESOPHAGEAL DILATION;  Surgeon: Rachael Fee, MD;  Location: [facility];  Service: Endoscopy;  Laterality: N/A;    MEDS:  Outpatient Prescriptions Prior to Visit  Medication Sig Dispense Refill  . acetaminophen (TYLENOL) 500 MG tablet Take 1,000 mg by mouth every 6 (six) hours as needed. For headache, pain or fever.      Marland Kitchen albuterol (PROVENTIL) (2.5 MG/3ML) 0.083% nebulizer solution Take 2.5  mg by nebulization every 6 (six) hours as needed. For shortness of breath      . amiodarone (PACERONE) 200 MG tablet Take 200 mg by mouth every morning.       Marland Kitchen aspirin EC 81 MG tablet Take 81 mg by mouth every morning.      . budesonide (PULMICORT) 0.25 MG/2ML nebulizer solution Take 2 mLs (0.25 mg total) by nebulization 2 (two) times daily.  60 mL  5  . carvedilol (COREG) 3.125 MG tablet TAKE 1 TABLET BY MOUTH 2 TIMES A DAY  60 tablet  3  . cetirizine (ZYRTEC) 10 MG tablet Take 10 mg by mouth every morning.       . clotrimazole-betamethasone (LOTRISONE) cream Apply 1 application topically 2 (two) times daily as needed. For skin irritation      . cyanocobalamin 1000 MCG/ML injection Inject 1,000 mcg into the muscle every 30 (thirty) days.       Marland Kitchen desoximetasone (TOPICORT) 0.25 % cream Apply 1 application topically 2 (two) times daily as needed. For rash      . finasteride (PROSCAR) 5 MG tablet Take 5 mg by mouth at bedtime.       . fluticasone (FLONASE) 50 MCG/ACT nasal spray Place 2 sprays into the nose daily.  16 g  6  . furosemide (LASIX) 40 MG tablet TAKE 1 TABLET BY MOUTH EVERY DAY  30 tablet  3  . guaiFENesin (MUCINEX) 600 MG 12 hr tablet Take 1,200 mg by mouth 2 (two) times daily as needed. For congestion      . guaiFENesin-dextromethorphan (ROBITUSSIN DM) 100-10 MG/5ML syrup Take 5 mLs by mouth 3 (three) times daily as needed. For cough.      Marland Kitchen ipratropium (ATROVENT) 0.03 % nasal spray Place 2 sprays into the nose 3 (three) times daily as needed. For allergies.      . isosorbide mononitrate (IMDUR) 60 MG 24 hr tablet Take 60 mg by mouth every morning.   1 tablet  0  . JANUVIA 100 MG tablet TAKE 1 TABLET BY MOUTH EVERY DAY  30 tablet  5  . levalbuterol (XOPENEX HFA) 45 MCG/ACT inhaler Inhale 1-2 puffs into the lungs every 4 (four) hours as needed. For shortness of breath  1 Inhaler  6  . levothyroxine (SYNTHROID, LEVOTHROID) 125 MCG tablet Take 62.5-125 mcg by mouth every other day. He  alternates daily with a whole tablet ( ) or a half a tablet (6.39mcg).      Marland Kitchen lithium carbonate 300 MG capsule Take 300 mg by mouth 2 (two) times daily with a meal.      . nitroGLYCERIN (NITROSTAT) 0.3 MG SL tablet Place  1 tablet (0.3 mg total) under the tongue every 5 (five) minutes as needed. For chest pain  25 tablet  2  . polyethylene glycol powder (GLYCOLAX/MIRALAX) powder 1 capful one to three times per day as needed for constipation  255 g  6  . potassium chloride (KLOR-CON M10) 10 MEQ tablet Take 10 mEq by mouth daily.      . predniSONE (DELTASONE) 20 MG tablet 1 tab po qd x 5d, then 1/2 tab po qd x 6d, then 1/2 tab po qod x 6 doses, then stop  11 tablet  0  . ranitidine (ZANTAC) 150 MG tablet Take 150 mg by mouth 2 (two) times daily.      . Tamsulosin HCl (FLOMAX) 0.4 MG CAPS Take 0.8 mg by mouth every morning.       . tiotropium (SPIRIVA) 18 MCG inhalation capsule Place 18 mcg into inhaler and inhale every morning.       . traZODone (DESYREL) 50 MG tablet 1-2 tabs po qhs  60 tablet  3  . warfarin (COUMADIN) 5 MG tablet Take 2.5-5 mg by mouth every evening. He takes one tablet (5mg ) on Monday and Friday and half a tablet (2.5mg ) on Tuesday, Wednesday, Thursday, Saturday and Sunday.      . traZODone (DESYREL) 50 MG tablet 1-2 tabs po qhs  30 tablet  1  . traZODone (DESYREL) 50 MG tablet 1-2 tabs po qhs prn insomnia  60 tablet  5   No facility-administered medications prior to visit.  **Currently not taking trazodone. Current coumadin dosing is 5mg  on Monday, 2.5mg  all other days of the week.  PE: Blood pressure 120/68, pulse 63, temperature 98 F (36.7 C), temperature source Oral, height 5\' 10"  (1.778 m), weight 156 lb 12 oz (71.101 kg), SpO2 95.00%. Gen: Alert, well appearing.  Patient is oriented to person, place, time, and situation. ENT: Ears: EACs clear, normal epithelium.  TMs with good light reflex and landmarks bilaterally.  Eyes: no injection, icteris, swelling, or  exudate.  EOMI, PERRLA. Nose: no drainage or turbinate edema/swelling.  No injection or focal lesion.  Mouth: lips without lesion/swelling.  Oral mucosa pink and moist.  Dentition intact and without obvious caries or gingival swelling.  Oropharynx without erythema, exudate, or swelling.  Neck - No masses or thyromegaly or limitation in range of motion CV: RRR, no m/r/g.   LUNGS: CTA bilat, nonlabored resps, good aeration in all lung fields. ABD: soft, NT/ND EXT: no clubbing, cyanosis, or edema.    IMPRESSION AND PLAN:  Depression Start trial of remeron 15mg  qhs.  Therapeutic expectations and side effect profile of medication discussed today.  Patient's questions answered. Hopefully this will start helping his appetite and sleep within 2 wks.  COPD Severe.   This is combining with chronic systolic heart failure to markedly limit him from a stamina standpoint.   No acute decompensation.   An After Visit Summary was printed and given to the patient.  Spent 30 min with pt today, with >50% of this time spent in counseling and care coordination regarding the above problems.  FOLLOW UP: 1 mo

## 2012-08-29 NOTE — Assessment & Plan Note (Signed)
Severe.   This is combining with chronic systolic heart failure to markedly limit him from a stamina standpoint.   No acute decompensation.

## 2012-08-29 NOTE — Assessment & Plan Note (Signed)
Start trial of remeron 15mg  qhs.  Therapeutic expectations and side effect profile of medication discussed today.  Patient's questions answered. Hopefully this will start helping his appetite and sleep within 2 wks.

## 2012-09-01 ENCOUNTER — Telehealth: Payer: Self-pay | Admitting: Cardiovascular Disease

## 2012-09-01 ENCOUNTER — Telehealth: Payer: Self-pay | Admitting: *Deleted

## 2012-09-01 MED ORDER — LEVOTHYROXINE SODIUM 125 MCG PO TABS: 62.5000 ug | ORAL_TABLET | ORAL | Status: AC

## 2012-09-01 NOTE — Telephone Encounter (Signed)
Rx request to pharmacy/[facility]  

## 2012-09-01 NOTE — Telephone Encounter (Signed)
Called patient about rx error with keep getting due to difference in DOB. Pharmacy stated that they have received rx.

## 2012-09-01 NOTE — Telephone Encounter (Signed)
Pt not aware dr/ nurse in today and he will wait till his app next week.

## 2012-09-01 NOTE — Telephone Encounter (Signed)
New problem    Put on new medication was told to call back to discuss.

## 2012-09-04 ENCOUNTER — Encounter: Payer: Self-pay | Admitting: Internal Medicine

## 2012-09-04 ENCOUNTER — Ambulatory Visit (INDEPENDENT_AMBULATORY_CARE_PROVIDER_SITE_OTHER): Payer: Medicare Other | Admitting: Internal Medicine

## 2012-09-04 ENCOUNTER — Ambulatory Visit (INDEPENDENT_AMBULATORY_CARE_PROVIDER_SITE_OTHER): Payer: Medicare Other | Admitting: Pharmacist

## 2012-09-04 VITALS — BP 102/70 | HR 72 | Wt 157.0 lb

## 2012-09-04 DIAGNOSIS — I2589 Other forms of chronic ischemic heart disease: Secondary | ICD-10-CM

## 2012-09-04 DIAGNOSIS — Z7901 Long term (current) use of anticoagulants: Secondary | ICD-10-CM

## 2012-09-04 DIAGNOSIS — I4891 Unspecified atrial fibrillation: Secondary | ICD-10-CM

## 2012-09-04 LAB — POCT INR: INR: 2.7

## 2012-09-04 NOTE — Progress Notes (Signed)
HPI Roberto [name] returns today for followup. He is a very pleasant 77 year old man with a long-standing ischemic cardio myopathy, chronic class II systolic heart failure, fairly severe peripheral vascular disease including aortic vascular disease, who is status post ICD implantation. The patient had a rate sensing problem on his Medtronic 6949-lead and a new ventricular pacing lead was placed 3 years ago. He suddenly developed an elevated high voltage lead impedance approximately one year ago, and his defibrillator was turned off. In the interim, he has been stable. He denies syncope, chest pain or palpitations. He has chronic class III heart failure. I saw the patient last, he was doing very poorly but has recently improved as he is now sleeping better and eating better. Allergies  Allergen Reactions  . Atorvastatin     REACTION: muscle ache in legs  . Crestor (Rosuvastatin Calcium) Other (See Comments)    Lower ext fatigue and soreness- if taken everyday  . Lisinopril Diarrhea  . Morphine     REACTION: AMS - agitation  . Prednisone     REACTION: Can't breath  . Trazodone And Nefazodone Other (See Comments)    "drunk"  . Zolpidem Tartrate     REACTION: AMS     Current Outpatient Prescriptions  Medication Sig Dispense Refill  . acetaminophen (TYLENOL) 500 MG tablet Take 1,000 mg by mouth every 6 (six) hours as needed. For headache, pain or fever.      Marland Kitchen albuterol (PROVENTIL) (2.5 MG/3ML) 0.083% nebulizer solution Take 2.5 mg by nebulization every 6 (six) hours as needed. For shortness of breath      . amiodarone (PACERONE) 200 MG tablet Take 200 mg by mouth every morning.       Marland Kitchen aspirin EC 81 MG tablet Take 81 mg by mouth every morning.      . budesonide (PULMICORT) 0.25 MG/2ML nebulizer solution Take 2 mLs (0.25 mg total) by nebulization 2 (two) times daily.  60 mL  5  . carvedilol (COREG) 3.125 MG tablet TAKE 1 TABLET BY MOUTH 2 TIMES A DAY  60 tablet  3  . cetirizine (ZYRTEC) 10 MG  tablet Take 10 mg by mouth every morning.       . clotrimazole-betamethasone (LOTRISONE) cream Apply 1 application topically 2 (two) times daily as needed. For skin irritation      . cyanocobalamin 1000 MCG/ML injection Inject 1,000 mcg into the muscle every 30 (thirty) days.       Marland Kitchen desoximetasone (TOPICORT) 0.25 % cream Apply 1 application topically 2 (two) times daily as needed. For rash      . finasteride (PROSCAR) 5 MG tablet Take 5 mg by mouth at bedtime.       . fluticasone (FLONASE) 50 MCG/ACT nasal spray Place 2 sprays into the nose daily.  16 g  6  . furosemide (LASIX) 40 MG tablet TAKE 1 TABLET BY MOUTH EVERY DAY  30 tablet  3  . guaiFENesin (MUCINEX) 600 MG 12 hr tablet Take 1,200 mg by mouth 2 (two) times daily as needed. For congestion      . guaiFENesin-dextromethorphan (ROBITUSSIN DM) 100-10 MG/5ML syrup Take 5 mLs by mouth 3 (three) times daily as needed. For cough.      Marland Kitchen ipratropium (ATROVENT) 0.03 % nasal spray Place 2 sprays into the nose 3 (three) times daily as needed. For allergies.      . isosorbide mononitrate (IMDUR) 60 MG 24 hr tablet Take 60 mg by mouth every morning.   1 tablet  0  . JANUVIA 100 MG tablet TAKE 1 TABLET BY MOUTH EVERY DAY  30 tablet  5  . levalbuterol (XOPENEX HFA) 45 MCG/ACT inhaler Inhale 1-2 puffs into the lungs every 4 (four) hours as needed. For shortness of breath  1 Inhaler  6  . levothyroxine (SYNTHROID, LEVOTHROID) 125 MCG tablet Take 0.5-1 tablets (62.5-125 mcg total) by mouth every other day. Alternate daily with a whole tablet ( ) or a half a tablet (6.53mcg)  30 tablet  5  . lithium carbonate 300 MG capsule Take 300 mg by mouth 2 (two) times daily with a meal.      . mirtazapine (REMERON) 15 MG tablet Take 1 tablet (15 mg total) by mouth at bedtime.  30 tablet  1  . nitroGLYCERIN (NITROSTAT) 0.3 MG SL tablet Place 1 tablet (0.3 mg total) under the tongue every 5 (five) minutes as needed. For chest pain  25 tablet  2  . polyethylene  glycol powder (GLYCOLAX/MIRALAX) powder 1 capful one to three times per day as needed for constipation  255 g  6  . potassium chloride (KLOR-CON M10) 10 MEQ tablet Take 10 mEq by mouth daily.      . ranitidine (ZANTAC) 150 MG tablet Take 150 mg by mouth 2 (two) times daily.      . Tamsulosin HCl (FLOMAX) 0.4 MG CAPS Take 0.8 mg by mouth every morning.       . tiotropium (SPIRIVA) 18 MCG inhalation capsule Place 18 mcg into inhaler and inhale every morning.       . warfarin (COUMADIN) 5 MG tablet Take 2.5-5 mg by mouth every evening. He takes one tablet (5mg ) on Monday and Friday and half a tablet (2.5mg ) on Tuesday, Wednesday, Thursday, Saturday and "Sunday.       No current facility-administered medications for this visit.     Past Medical History  Diagnosis Date  . CAD (coronary artery disease)     post CABG with prior percutaneous intervention of the saphenous vein graft with known saphenous vein graft disease.  . AAA (abdominal aortic aneurysm)     a. CTA 09/2011: 4.1-4.2cm infrarenal AAA.  . Aneurysm of thoracic aorta 1995    a. Rupture 1995 w/spontaneous resolution;  b. CTA 09/2011: 6.8cm dist, 5.7cm mid  . Popliteal artery aneurysm   . Myocardial infarction   . Ischemic cardiomyopathy     10/2011 ECHO: EF 30-35%, apical akinesis  . Paroxysmal atrial fibrillation   . Hypertension   . Peripheral vascular disease   . Dyslipidemia   . Diabetes mellitus 2007    dx'd 2007 w/persisting FBS>125  . GERD (gastroesophageal reflux disease)   . Hiatal hernia   . Diverticular disease   . BPH (benign prostatic hypertrophy)     with hx of acute urinary retention (pt can self-cath)  . COPD (chronic obstructive pulmonary disease)     12" /28/09 PFT FEV1 1.84 (55%), FVC 4.42 (95%), TLC 6.46 (92%), DLCO 72%, no BD response.  Overnight oximetry 10/2011 normal on RA.  Marland Kitchen Pneumonia 1/09  . Allergic rhinitis   . Shingles     X 2 episodes, both in left scapula area  . OSA (obstructive sleep apnea)  06/14/09    PSG RDI 17, PLMI 96, intolerant of CPAP or BPAP  . Chronic renal insufficiency     Baseline Cr 1.5  . Prostate cancer 07/2008    Low grade; watchful waiting with [name]  . Pulmonary nodule, left 03/02/2010    Left  base; resolved on f/u CT  . Allergic rhinitis   . Carpal tunnel syndrome of left wrist 02/11/2011  . IMPLANTATION OF DEFIBRILLATOR, HX OF 09/02/2008    Medtronic 7232Cx Maximo V. Had a 6949 lead - failed and therapies turned off.  . IRON DEFICIENCY 06/08/2010    Hemoccult negative:  GI ([name]) recommended no invasive diagnostics--watchful waiting (2012).  . UNSPECIFIED ANEMIA 06/01/2010    Multifactorial: iron def, CRI, vit B12 def,   . Hypotension 04/14/2012  . CHF (congestive heart failure)   . Atrial fibrillation     ROS:   All systems reviewed and negative except as noted in the HPI.   Past Surgical History  Procedure Laterality Date  . Cardiac defibrillator placement  08/28/2004    [name]  . Colon surgery  1995    partial colectomy  . Coronary artery bypass graft  1996  . Aorta - bilateral femoral artery bypass graft  1999    [name] Early  . Nose surgery    . Insertion of pacing lead      New rate sensing pacing lead with removal of a previous implanted ICD and insertion of a device back in the pocket with defibrillation threshold testing  . Abdominal aortic aneurysm repair  1999    Ingrarenal repair/ graft  . Stents      Placed to the saphenous vein graft  . Radiofrequency ablation  2006    A flutter  . Esophagogastroduodenoscopy (egd) with esophageal dilation  05/28/2012    Procedure: ESOPHAGOGASTRODUODENOSCOPY (EGD) WITH ESOPHAGEAL DILATION;  Surgeon: Rachael Fee, MD;  Location: [facility];  Service: Endoscopy;  Laterality: N/A;     Family History  Problem Relation Age of Onset  . Stroke Mother 82  . Heart attack Mother     CVA  . Cancer Father 76    Lung  . Heart attack Father 73  . Lung cancer Father   .  Coronary artery disease Other     2 of 5 siblings with CAD     History   Social History  . Marital Status: Married    Spouse Name: N/A    Number of Children: N/A  . Years of Education: N/A   Occupational History  . RETIRED     Mechanic   Social History Main Topics  . Smoking status: Former Smoker -- 1.00 packs/day for 30 years    Types: Cigarettes    Quit date: 05/23/2003  . Smokeless tobacco: Former Neurosurgeon    Types: Chew    Quit date: 05/14/2003     Comment: 80 pack a year history  . Alcohol Use: 0.6 oz/week    1 Cans of beer per week     Comment: occasionally  . Drug Use: No  . Sexually Active: Not on file   Other Topics Concern  . Not on file   Social History Narrative   Lives in [place] with his wife.   Very involved in his church.   Enjoys bluegrass music, going to [employer], spending time with extended family.   Used to play golf twice a week up until 2012.   No alc/drug use.   Former smoker, quit 2005.              BP 102/70  Pulse 72  Wt 157 lb (71.215 kg)  BMI 22.53 kg/m2  Physical Exam:  Well appearing 77 year old man,NAD HEENT: Unremarkable Neck:  7 cm JVD, no thyromegally Back:  No  CVA tenderness Lungs:  Clear with no wheezes, rales, or rhonchi. HEART:  Regular rate rhythm, no murmurs, no rubs, no clicks Abd:  soft, positive bowel sounds, no organomegally, no rebound, no guarding Ext:  2 plus pulses, no edema, no cyanosis, no clubbing Skin:  No rashes no nodules Neuro:  CN II through XII intact, motor grossly intact  EKG - normal sinus rhythm with prior septal MI  DEVICE  ICD tachycardia therapies had been turned off  Assess/Plan:

## 2012-09-04 NOTE — Assessment & Plan Note (Signed)
He is sedentary but denies anginal symptoms. No change in medical therapy.

## 2012-09-04 NOTE — Assessment & Plan Note (Signed)
From an arrhythmia perspective, he has been stable over the last year. We discussed the pros and cons of defibrillator lead revision in detail. He has never had an ICD therapy. I've recommended watchful waiting. I do not think he is a particularly good candidate for lead extraction.

## 2012-09-04 NOTE — Patient Instructions (Addendum)
Your physician wants you to follow-up in: 6 months with Dr Taylor You will receive a reminder letter in the mail two months in advance. If you don't receive a letter, please call our office to schedule the follow-up appointment.  

## 2012-09-08 ENCOUNTER — Ambulatory Visit (INDEPENDENT_AMBULATORY_CARE_PROVIDER_SITE_OTHER): Payer: Medicare Other | Admitting: Cardiovascular Disease

## 2012-09-08 ENCOUNTER — Encounter: Payer: Self-pay | Admitting: Cardiovascular Disease

## 2012-09-08 ENCOUNTER — Ambulatory Visit: Payer: Medicare Other | Admitting: Cardiovascular Disease

## 2012-09-08 VITALS — BP 126/78 | HR 74 | Ht 71.5 in | Wt 161.6 lb

## 2012-09-08 DIAGNOSIS — I5022 Chronic systolic (congestive) heart failure: Secondary | ICD-10-CM

## 2012-09-08 MED ORDER — ISOSORBIDE MONONITRATE ER 60 MG PO TB24: 60.0000 mg | ORAL_TABLET | Freq: Every morning | ORAL | Status: DC

## 2012-09-08 NOTE — Patient Instructions (Addendum)
Your physician recommends that you schedule a follow-up appointment in: 4 MONTHS with Dr Cooper  Your physician recommends that you continue on your current medications as directed. Please refer to the Current Medication list given to you today.  

## 2012-09-09 ENCOUNTER — Other Ambulatory Visit: Payer: Self-pay | Admitting: *Deleted

## 2012-09-10 ENCOUNTER — Encounter: Payer: Self-pay | Admitting: Cardiovascular Disease

## 2012-09-10 NOTE — Progress Notes (Addendum)
HPI:  77 year old gentleman presenting for followup evaluation. He has been followed for many years by Dr. Riley Kill, I will be assuming his cardiac care and Dr. Rosalyn Charters retirement. The patient has a multitude of medical problems. He has an ischemic cardiomyopathy. He's undergone coronary bypass surgery and PCI. His last heart catheterization was in 2011 by Dr. Juanda Chance. This demonstrated severe three-vessel native coronary disease. His bypass grafts were occluded with the exception of the LIMA to LAD and vein graft to the diagonal. Last assessment of LV function was in June 2013 and this demonstrated a left ventricular ejection fraction of 30-35%. This was improved from his previous LV function. The patient also has a large arachnoidal abdominal aneurysm. He was evaluated by Dr. Pattricia Boss at [facility]. He was quoted a 15-20% annual risk of rupture. However, and considering his operative morbidity and mortality risk, he decided not to pursue surgery.  Overall he feels that his symptoms are stable. He denies any symptoms of exertional chest pain or pressure. He is limited by long-standing exertional dyspnea. He denies edema, palpitations, or syncope.  Outpatient Encounter Prescriptions as of 09/08/2012  Medication Sig Dispense Refill  . acetaminophen (TYLENOL) 500 MG tablet Take 1,000 mg by mouth every 6 (six) hours as needed. For headache, pain or fever.      Marland Kitchen albuterol (PROVENTIL) (2.5 MG/3ML) 0.083% nebulizer solution Take 2.5 mg by nebulization every 6 (six) hours as needed. For shortness of breath      . amiodarone (PACERONE) 200 MG tablet Take 200 mg by mouth every morning.       Marland Kitchen aspirin EC 81 MG tablet Take 81 mg by mouth every morning.      . budesonide (PULMICORT) 0.25 MG/2ML nebulizer solution Take 2 mLs (0.25 mg total) by nebulization 2 (two) times daily.  60 mL  5  . carvedilol (COREG) 3.125 MG tablet TAKE 1 TABLET BY MOUTH 2 TIMES A DAY  60 tablet  3  . cetirizine (ZYRTEC) 10 MG tablet Take 10 mg  by mouth every morning.       . clotrimazole-betamethasone (LOTRISONE) cream Apply 1 application topically 2 (two) times daily as needed. For skin irritation      . cyanocobalamin 1000 MCG/ML injection Inject 1,000 mcg into the muscle every 30 (thirty) days.       Marland Kitchen desoximetasone (TOPICORT) 0.25 % cream Apply 1 application topically 2 (two) times daily as needed. For rash      . finasteride (PROSCAR) 5 MG tablet Take 5 mg by mouth at bedtime.       . fluticasone (FLONASE) 50 MCG/ACT nasal spray Place 2 sprays into the nose daily.  16 g  6  . furosemide (LASIX) 40 MG tablet TAKE 1 TABLET BY MOUTH EVERY DAY  30 tablet  3  . guaiFENesin (MUCINEX) 600 MG 12 hr tablet Take 1,200 mg by mouth 2 (two) times daily as needed. For congestion      . guaiFENesin-dextromethorphan (ROBITUSSIN DM) 100-10 MG/5ML syrup Take 5 mLs by mouth 3 (three) times daily as needed. For cough.      Marland Kitchen ipratropium (ATROVENT) 0.03 % nasal spray Place 2 sprays into the nose 3 (three) times daily as needed. For allergies.      . isosorbide mononitrate (IMDUR) 60 MG 24 hr tablet Take 1 tablet (60 mg total) by mouth every morning.  30 tablet  11  . JANUVIA 100 MG tablet TAKE 1 TABLET BY MOUTH EVERY DAY  30 tablet  5  .  levalbuterol (XOPENEX HFA) 45 MCG/ACT inhaler Inhale 1-2 puffs into the lungs every 4 (four) hours as needed. For shortness of breath  1 Inhaler  6  . levothyroxine (SYNTHROID, LEVOTHROID) 125 MCG tablet Take 0.5-1 tablets (62.5-125 mcg total) by mouth every other day. Alternate daily with a whole tablet ( ) or a half a tablet (6.53mcg)  30 tablet  5  . lithium carbonate 300 MG capsule Take 300 mg by mouth 2 (two) times daily with a meal.      . mirtazapine (REMERON) 15 MG tablet Take 1 tablet (15 mg total) by mouth at bedtime.  30 tablet  1  . nitroGLYCERIN (NITROSTAT) 0.3 MG SL tablet Place 1 tablet (0.3 mg total) under the tongue every 5 (five) minutes as needed. For chest pain  25 tablet  2  . polyethylene  glycol powder (GLYCOLAX/MIRALAX) powder 1 capful one to three times per day as needed for constipation  255 g  6  . potassium chloride (KLOR-CON M10) 10 MEQ tablet Take 10 mEq by mouth daily.      . ranitidine (ZANTAC) 150 MG tablet Take 150 mg by mouth 2 (two) times daily.      . Tamsulosin HCl (FLOMAX) 0.4 MG CAPS Take 0.8 mg by mouth every morning.       . tiotropium (SPIRIVA) 18 MCG inhalation capsule Place 18 mcg into inhaler and inhale every morning.       . warfarin (COUMADIN) 5 MG tablet Take 2.5-5 mg by mouth every evening. He takes one tablet (5mg ) on Monday and Friday and half a tablet (2.5mg ) on Tuesday, Wednesday, Thursday, Saturday and Sunday.      . [DISCONTINUED] isosorbide mononitrate (IMDUR) 60 MG 24 hr tablet Take 60 mg by mouth every morning.   1 tablet  0   No facility-administered encounter medications on file as of 09/08/2012.    Allergies  Allergen Reactions  . Atorvastatin     REACTION: muscle ache in legs  . Crestor (Rosuvastatin Calcium) Other (See Comments)    Lower ext fatigue and soreness- if taken everyday  . Lisinopril Diarrhea  . Morphine     REACTION: AMS - agitation  . Prednisone     REACTION: Can\'t breath  . Trazodone And Nefazodone Other (See Comments)    "drunk"  . Zolpidem Tartrate     REACTION: AMS    Past Medical History  Diagnosis Date  . CAD (coronary artery disease)     post CABG with prior percutaneous intervention of the saphenous vein graft with known saphenous vein graft disease.  . AAA (abdominal aortic aneurysm)     a. CTA 09/2011: 4.1-4.2cm infrarenal AAA.  . Aneurysm of thoracic aorta 1995    a. Rupture 1995 w/spontaneous resolution;  b. CTA 09/2011: 6.8cm dist, 5.7cm mid  . Popliteal artery aneurysm   . Myocardial infarction   . Ischemic cardiomyopathy     10/2011 ECHO: EF 30-35%, apical akinesis  . Paroxysmal atrial fibrillation   . Hypertension   . Peripheral vascular disease   . Dyslipidemia   . Diabetes mellitus 2007     dx\'d 2007 w/persisting FBS>125  . GERD (gastroesophageal reflux disease)   . Hiatal hernia   . Diverticular disease   . BPH (benign prostatic hypertrophy)     with hx of acute urinary retention (pt can self-cath)  . COPD (chronic obstructive pulmonary disease)     12 /28/09 PFT FEV1 1.84 (55%), FVC 4.42 (95%), TLC 6.46 (92%), DLCO 72%, no BD  response.  Overnight oximetry 10/2011 normal on RA.  Marland Kitchen Pneumonia 1/09  . Allergic rhinitis   . Shingles     X 2 episodes, both in left scapula area  . OSA (obstructive sleep apnea) 06/14/09    PSG RDI 17, PLMI 96, intolerant of CPAP or BPAP  . Chronic renal insufficiency     Baseline Cr 1.5  . Prostate cancer 07/2008    Low grade; watchful waiting with [name]  . Pulmonary nodule, left 03/02/2010    Left base; resolved on f/u CT  . Allergic rhinitis   . Carpal tunnel syndrome of left wrist 02/11/2011  . IMPLANTATION OF DEFIBRILLATOR, HX OF 09/02/2008    [employer] 7232Cx Maximo V. Had a 6949 lead - failed and therapies turned off.  . IRON DEFICIENCY 06/08/2010    Hemoccult negative:  GI ([name]) recommended no invasive diagnostics--watchful waiting (2012).  . UNSPECIFIED ANEMIA 06/01/2010    Multifactorial: iron def, CRI, vit B12 def,   . Hypotension 04/14/2012  . CHF (congestive heart failure)   . Atrial fibrillation     ROS: Negative except as per HPI  BP 126/78  Pulse 74  Ht 5' 11.5" (1.816 m)  Wt 73.301 kg (161 lb 9.6 oz)  BMI 22.23 kg/m2  PHYSICAL EXAM: Pt is alert and oriented, chronically ill-appearing thin male in NAD HEENT: normal Neck: JVP - normal Lungs: CTA bilaterally with prolonged expiratory phase CV: RRR without murmur or gallop Abd: soft, NT, Positive BS, no hepatomegaly Ext: no C/C/E Skin: warm/dry no rash  ASSESSMENT AND PLAN: 1. CAD status post CABG in PCI. The patient appears stable without anginal symptoms. He will continue on aspirin for antiplatelet therapy and isosorbide and carvedilol for antianginal  therapy. I will see him back in 4 months.  2. The chronic systolic heart failure secondary to underlying ischemic cardiomyopathy. He also appears stable with respect to his heart failure. His medical therapy was reviewed and is appropriate. He is either allergic or intolerant to ACE inhibitors and I will review this in the future but I suspect is not a good candidate for an ACE or ARB because of previous intolerance.  3. Atrial fibrillation. EKG last week when he saw Dr. Ladona Ridgel was reviewed. He was in sinus rhythm. He is anticoagulated with warfarin. He is on amiodarone as well.  For followup I will see him back in 4 months. Overall he seems to be stable despite multiple significant problems.  Tonny Bollman 09/10/2012 6:28 PM   ADDENDUM: Sherron Monday with Mr Ekholm this afternoon. Have received request for cardiac clearance for TURP. As outlined he has multiple comorbidities with chronic CHF, CAD, and abdominal aortic aneurysm. His risk of cardiovascular events with surgery is at least moderately increased. However, his symptoms are stable and he has no angina. I don't think further cardiac imaging/stress testing will help with risk stratification. Echo shows stable findings from 2013 study. He can proceed on his current medical program with close cardiopulmonary monitoring perioperatively. He understands additional risk of cardiac problems related to issues above.  Tonny Bollman 11/04/2012 3:43 PM

## 2012-09-15 ENCOUNTER — Other Ambulatory Visit: Payer: Self-pay | Admitting: *Deleted

## 2012-09-16 ENCOUNTER — Other Ambulatory Visit: Payer: Self-pay | Admitting: *Deleted

## 2012-09-17 ENCOUNTER — Other Ambulatory Visit: Payer: Self-pay | Admitting: *Deleted

## 2012-09-17 ENCOUNTER — Ambulatory Visit: Payer: Medicare Other | Admitting: Pulmonary Disease

## 2012-09-17 ENCOUNTER — Ambulatory Visit (INDEPENDENT_AMBULATORY_CARE_PROVIDER_SITE_OTHER): Payer: Medicare Other

## 2012-09-17 DIAGNOSIS — I4891 Unspecified atrial fibrillation: Secondary | ICD-10-CM

## 2012-09-17 DIAGNOSIS — Z7901 Long term (current) use of anticoagulants: Secondary | ICD-10-CM

## 2012-09-17 LAB — POCT INR: INR: 3.2

## 2012-09-21 ENCOUNTER — Encounter: Payer: Self-pay | Admitting: Pulmonary Disease

## 2012-09-21 ENCOUNTER — Ambulatory Visit (INDEPENDENT_AMBULATORY_CARE_PROVIDER_SITE_OTHER): Payer: Medicare Other | Admitting: Pulmonary Disease

## 2012-09-21 ENCOUNTER — Other Ambulatory Visit (INDEPENDENT_AMBULATORY_CARE_PROVIDER_SITE_OTHER): Payer: Medicare Other

## 2012-09-21 VITALS — BP 116/62 | HR 60 | Ht 71.5 in | Wt 160.8 lb

## 2012-09-21 DIAGNOSIS — J449 Chronic obstructive pulmonary disease, unspecified: Secondary | ICD-10-CM

## 2012-09-21 LAB — CBC
Hemoglobin: 11.2 g/dL — ABNORMAL LOW (ref 13.0–17.0)
MCHC: 33.2 g/dL (ref 30.0–36.0)
MCV: 85.9 fl (ref 78.0–100.0)
Platelets: 190 10*3/uL (ref 150.0–400.0)
RDW: 16.6 % — ABNORMAL HIGH (ref 11.5–14.6)

## 2012-09-21 LAB — BASIC METABOLIC PANEL
BUN: 30 mg/dL — ABNORMAL HIGH (ref 6–23)
CO2: 25 mEq/L (ref 19–32)
GFR: 31.21 mL/min — ABNORMAL LOW (ref >60.00)
Glucose, Bld: 93 mg/dL (ref 70–99)
Potassium: 4.2 mEq/L (ref 3.5–5.1)

## 2012-09-21 MED ORDER — FORMOTEROL FUMARATE 20 MCG/2ML IN NEBU: 20.0000 ug | INHALATION_SOLUTION | Freq: Two times a day (BID) | RESPIRATORY_TRACT | Status: DC

## 2012-09-21 NOTE — Patient Instructions (Signed)
Lab work today Continue spiriva one puff daily Performist one vial nebulized twice per day Budesonide one vial nebulized twice per day >> use after using performist Albuterol one vial nebulized as needed for cough, wheeze, or chest congestion Xopenex two puffs as needed for cough, wheeze, or chest congestion Follow up in 2 months

## 2012-09-21 NOTE — Assessment & Plan Note (Signed)
He has progressive symptoms of dyspnea.  He had recent cardiac evaluation and this was stable.  Will add performist and continue pulmicort.  He is to continue spiriva.  He is to continue prn albuterol and xopenex.  Will check his BMET to assess his HCO3 >> if this is elevated, then there is concern that he could be retaining CO2 and would need ABG to further assess.  Will check his CBC to exclude anemia as a contributor to his dyspnea.

## 2012-09-21 NOTE — Progress Notes (Signed)
Chief Complaint  Patient presents with  . Follow-up    Pt states his breathing has not been good at all. He has occaisonal cough w/ clear phlem. He occasionally has wheezing just resting but mainly with activity and chest tx w/ activity.     History of Present Illness: Roberto [name] is a 77 y.o. male with COPD, and rhinitis.  He is using pulmicort twice per day, and spiriva once per day.  He uses albuterol in his nebulizer at least twice per day, and xopenex inhaler at least twice per day.  He feels his breathing is getting progressively worse.  He has tremors in his hands, and has trouble feeling unsteady with his walking.  He can't do anything without getting winded.  He still gets intermittent hoarseness.  His wife notes that he sometimes gets confused, and will have intermittent twitching.  He likes to work in his garden [employer], but this has been more difficult this season.  Tests: PFT 05/09/08 >>FEV1 1.84 (55%), FVC 4.42 (95%), TLC 6.46 (92%), DLCO 72%, no BD response Spirometry 08/28/11>>FEV1 1.51 (43%), FEV1% 48 CT chest 10/01/11>>6.5 cm descending TAA Echo 11/06/11>>EF 30 to 35%, mod LA dilation, PAS 42 mm Hg   He  has a past medical history of CAD (coronary artery disease); AAA (abdominal aortic aneurysm); Aneurysm of thoracic aorta (1995); Popliteal artery aneurysm; Myocardial infarction; Ischemic cardiomyopathy; Paroxysmal atrial fibrillation; Hypertension; Peripheral vascular disease; Dyslipidemia; Diabetes mellitus (2007); GERD (gastroesophageal reflux disease); Hiatal hernia; Diverticular disease; BPH (benign prostatic hypertrophy); COPD (chronic obstructive pulmonary disease); Pneumonia (1/09); Allergic rhinitis; Shingles; OSA (obstructive sleep apnea) (06/14/09); Chronic renal insufficiency; Prostate cancer (07/2008); Pulmonary nodule, left (03/02/2010); Allergic rhinitis; Carpal tunnel syndrome of left wrist (02/11/2011); IMPLANTATION OF DEFIBRILLATOR, HX OF (09/02/2008);  IRON DEFICIENCY (06/08/2010); UNSPECIFIED ANEMIA (06/01/2010); Hypotension (04/14/2012); CHF (congestive heart failure); and Atrial fibrillation.   He  has past surgical history that includes Cardiac defibrillator placement (08/28/2004); Colon surgery (1995); Coronary artery bypass graft (1996); Aorta - bilateral femoral artery bypass graft (1999); Nose surgery; Insertion of pacing lead; Abdominal aortic aneurysm repair (1999); Stents; Radiofrequency ablation (2006); and Esophagogastroduodenoscopy (egd) with esophageal dilation (05/28/2012).   Current Outpatient Prescriptions on File Prior to Visit  Medication Sig Dispense Refill  . acetaminophen (TYLENOL) 500 MG tablet Take 1,000 mg by mouth every 6 (six) hours as needed. For headache, pain or fever.      Marland Kitchen albuterol (PROVENTIL) (2.5 MG/3ML) 0.083% nebulizer solution Take 2.5 mg by nebulization every 6 (six) hours as needed. For shortness of breath      . amiodarone (PACERONE) 200 MG tablet Take 200 mg by mouth every morning.       Marland Kitchen aspirin EC 81 MG tablet Take 81 mg by mouth every morning.      . budesonide (PULMICORT) 0.25 MG/2ML nebulizer solution Take 2 mLs (0.25 mg total) by nebulization 2 (two) times daily.  60 mL  5  . carvedilol (COREG) 3.125 MG tablet TAKE 1 TABLET BY MOUTH 2 TIMES A DAY  60 tablet  3  . cetirizine (ZYRTEC) 10 MG tablet Take 10 mg by mouth every morning.       . clotrimazole-betamethasone (LOTRISONE) cream Apply 1 application topically 2 (two) times daily as needed. For skin irritation      . cyanocobalamin 1000 MCG/ML injection Inject 1,000 mcg into the muscle every 30 (thirty) days.       Marland Kitchen desoximetasone (TOPICORT) 0.25 % cream Apply 1 application topically 2 (two) times daily  as needed. For rash      . finasteride (PROSCAR) 5 MG tablet Take 5 mg by mouth at bedtime.       . fluticasone (FLONASE) 50 MCG/ACT nasal spray Place 2 sprays into the nose daily.  16 g  6  . furosemide (LASIX) 40 MG tablet TAKE 1 TABLET BY MOUTH  EVERY DAY  30 tablet  3  . guaiFENesin (MUCINEX) 600 MG 12 hr tablet Take 1,200 mg by mouth 2 (two) times daily as needed. For congestion      . guaiFENesin-dextromethorphan (ROBITUSSIN DM) 100-10 MG/5ML syrup Take 5 mLs by mouth 3 (three) times daily as needed. For cough.      Marland Kitchen ipratropium (ATROVENT) 0.03 % nasal spray Place 2 sprays into the nose 3 (three) times daily as needed. For allergies.      . isosorbide mononitrate (IMDUR) 60 MG 24 hr tablet Take 1 tablet (60 mg total) by mouth every morning.  30 tablet  11  . JANUVIA 100 MG tablet TAKE 1 TABLET BY MOUTH EVERY DAY  30 tablet  5  . levalbuterol (XOPENEX HFA) 45 MCG/ACT inhaler Inhale 1-2 puffs into the lungs every 4 (four) hours as needed. For shortness of breath  1 Inhaler  6  . levothyroxine (SYNTHROID, LEVOTHROID) 125 MCG tablet Take 0.5-1 tablets (62.5-125 mcg total) by mouth every other day. Alternate daily with a whole tablet ( ) or a half a tablet (6.37mcg)  30 tablet  5  . lithium carbonate 300 MG capsule Take 300 mg by mouth 2 (two) times daily with a meal.      . mirtazapine (REMERON) 15 MG tablet Take 1 tablet (15 mg total) by mouth at bedtime.  30 tablet  1  . nitroGLYCERIN (NITROSTAT) 0.3 MG SL tablet Place 1 tablet (0.3 mg total) under the tongue every 5 (five) minutes as needed. For chest pain  25 tablet  2  . polyethylene glycol powder (GLYCOLAX/MIRALAX) powder 1 capful one to three times per day as needed for constipation  255 g  6  . potassium chloride (KLOR-CON M10) 10 MEQ tablet Take 10 mEq by mouth daily.      . ranitidine (ZANTAC) 150 MG tablet Take 150 mg by mouth 2 (two) times daily.      . Tamsulosin HCl (FLOMAX) 0.4 MG CAPS Take 0.8 mg by mouth every morning.       . tiotropium (SPIRIVA) 18 MCG inhalation capsule Place 18 mcg into inhaler and inhale every morning.       . warfarin (COUMADIN) 5 MG tablet Take 2.5-5 mg by mouth every evening. He takes one tablet (5mg ) on Monday and Friday and half a tablet  (2.5mg ) on Tuesday, Wednesday, Thursday, Saturday and Sunday.       No current facility-administered medications on file prior to visit.    Allergies  Allergen Reactions  . Atorvastatin     REACTION: muscle ache in legs  . Crestor (Rosuvastatin Calcium) Other (See Comments)    Lower ext fatigue and soreness- if taken everyday  . Lisinopril Diarrhea  . Morphine     REACTION: AMS - agitation  . Prednisone     REACTION: Can't breath  . Trazodone And Nefazodone Other (See Comments)    "drunk"  . Zolpidem Tartrate     REACTION: AMS    Physical Exam:  General - thin HEENT - No sinus tenderness, clear nasal drainage, no oral exudate, no LAN  Cardiac - s1s2 no murmur  Chest -  diminished breath sounds, no wheeze/rales  Abd - soft, nontender  Ext - no edema  Neuro - normal strength Psych - normal behavior/mood  Skin - multiple ecchysmoses  Dg Chest 2 View  08/17/2012  *RADIOLOGY REPORT*  Clinical Data: Dyspnea on exertion.  CHEST - 2 VIEW  Comparison: 04/14/2012  Findings: Two views of the chest demonstrate a left cardiac ICD. Heart size is stable.  Post CABG changes.  Chronic densities at the left lung base are suggestive for scarring or atelectasis. No evidence for pulmonary edema. Again noted are bulges involving the descending thoracic aorta consistent with aneurysmal dilatation.  IMPRESSION: No acute chest findings.  Descending thoracic aortic aneurysm.   Original Report Authenticated By: Richarda Overlie, M.D.     Assessment/Plan:  Coralyn Helling, MD  Pulmonary/Critical Care Pager:  [phone]

## 2012-09-22 ENCOUNTER — Telehealth: Payer: Self-pay | Admitting: Family Medicine

## 2012-09-22 ENCOUNTER — Telehealth: Payer: Self-pay | Admitting: Pulmonary Disease

## 2012-09-22 NOTE — Telephone Encounter (Signed)
Lab Results  Component Value Date   CREATININE 2.2* 09/21/2012   BUN 30* 09/21/2012   NA 139 09/21/2012   K 4.2 09/21/2012   CL 108 09/21/2012   CO2 25 09/21/2012   Lab Results  Component Value Date   WBC 9.1 09/21/2012   HGB 11.2* 09/21/2012   HCT 33.8* 09/21/2012   MCV 85.9 09/21/2012   PLT 190.0 09/21/2012   Results d/w pt.  Explained that he has increase in creatinine.  Concern is for what is causing this, and what adjustments need to be made to his medications.  Will forward results to Dr. Milinda Cave.  Advised pt to call Dr. Milinda Cave to discuss further assessment.

## 2012-09-22 NOTE — Telephone Encounter (Signed)
Dr. [name] is requesting that Dr. Milinda Cave fu with patient regarding recent blood test results. Patient's wife also states she thinks patient is unsure how to take his inhaler.

## 2012-09-22 NOTE — Telephone Encounter (Signed)
Talked to Dr. [name] today and he asked me to f/u on Roberto [name] regarding possible lithium toxicity and also for acute worsening of his CRI. I talked to [name] wife, on the phone today and he is apparently breathing better since seeing Dr. [name] and starting new med (progressa).   I asked her to have him to a in/out cath to check post-void residual.  He is maintaining good hydration and has not been taking any extra diuretic. I recommended he stop his lithium immediately. I'll see him early next week and recheck BMET.

## 2012-09-23 ENCOUNTER — Telehealth: Payer: Self-pay | Admitting: Pulmonary Disease

## 2012-09-23 MED ORDER — TIOTROPIUM BROMIDE MONOHYDRATE 18 MCG IN CAPS: 18.0000 ug | ORAL_CAPSULE | Freq: Every morning | RESPIRATORY_TRACT | Status: DC

## 2012-09-23 NOTE — Telephone Encounter (Signed)
Patient collected 4.5 oz. after urination this morning.

## 2012-09-23 NOTE — Telephone Encounter (Signed)
Pls call pt and tell him I recommend he do i/o cath three times per day b/c he is not emptying his bladder well enough and I think that is causing his kidneys to function poorly.  Also, please call [facility] ---he already sees Dr. Mena Goes there-and try to get him an acute visit for urinary retention and acute-on-chronic renal failure--first available provider there is fine, actually.  He has appt scheduled with me for this Friday but if this has to be changed to get him in with urologist then that is what he needs the most.  --thx

## 2012-09-23 NOTE — Telephone Encounter (Signed)
I spoke with Roberto [name]. Pt needs refill on spiriva. RX has been sent to pharmacy. Nothing further was needed

## 2012-09-23 NOTE — Telephone Encounter (Signed)
FYI: Patient informed, understood & agreed; appointment scheduled w/[name] at [facility] for Thurs, Sep 24, 2012 @ 10:00am/SLS

## 2012-09-25 ENCOUNTER — Other Ambulatory Visit: Payer: Self-pay | Admitting: Pulmonary Disease

## 2012-09-25 ENCOUNTER — Telehealth: Payer: Self-pay | Admitting: Pulmonary Disease

## 2012-09-25 ENCOUNTER — Encounter: Payer: Self-pay | Admitting: Family Medicine

## 2012-09-25 ENCOUNTER — Ambulatory Visit (INDEPENDENT_AMBULATORY_CARE_PROVIDER_SITE_OTHER): Payer: Medicare Other | Admitting: Family Medicine

## 2012-09-25 VITALS — BP 126/76 | HR 57 | Temp 97.4°F | Resp 18 | Wt 157.8 lb

## 2012-09-25 DIAGNOSIS — R339 Retention of urine, unspecified: Secondary | ICD-10-CM

## 2012-09-25 DIAGNOSIS — F329 Major depressive disorder, single episode, unspecified: Secondary | ICD-10-CM

## 2012-09-25 DIAGNOSIS — R0609 Other forms of dyspnea: Secondary | ICD-10-CM

## 2012-09-25 DIAGNOSIS — R5381 Other malaise: Secondary | ICD-10-CM

## 2012-09-25 DIAGNOSIS — I509 Heart failure, unspecified: Secondary | ICD-10-CM

## 2012-09-25 DIAGNOSIS — R5383 Other fatigue: Secondary | ICD-10-CM

## 2012-09-25 DIAGNOSIS — N179 Acute kidney failure, unspecified: Secondary | ICD-10-CM

## 2012-09-25 LAB — BASIC METABOLIC PANEL
BUN: 34 mg/dL — ABNORMAL HIGH (ref 6–23)
Calcium: 9.3 mg/dL (ref 8.4–10.5)
Chloride: 108 mEq/L (ref 96–112)
Creat: 2.58 mg/dL — ABNORMAL HIGH (ref 0.50–1.35)

## 2012-09-25 NOTE — Telephone Encounter (Signed)
I spoke with the pharmacy because we sent rx for perforomist at time of OV. This was on hold because insurance will not cover. The Roberto [name] has medicare so this needs to be sent to DME company. I [id number] to explain this to the Roberto [name].Jennifer [place], CMA

## 2012-09-25 NOTE — Telephone Encounter (Signed)
Pt is aware that both of these medications have been already been sent to [facility]. Nothing further was needed.

## 2012-09-26 DIAGNOSIS — N179 Acute kidney failure, unspecified: Secondary | ICD-10-CM | POA: Insufficient documentation

## 2012-09-26 DIAGNOSIS — R339 Retention of urine, unspecified: Secondary | ICD-10-CM | POA: Insufficient documentation

## 2012-09-26 NOTE — Assessment & Plan Note (Signed)
His mood is pretty stable, considering all his health complaints lately.   Denies improvement in mood from remeron but he certainly has found that this med helps with his insomnia and his poor appetite..  Continue this med at current dosing for now.

## 2012-09-26 NOTE — Progress Notes (Signed)
OFFICE NOTE  09/26/2012  CC:  Chief Complaint  Patient presents with  . Follow-up    1-mth [Appetite, Sleep, Depression]     HPI: Patient is a 77 y.o. Caucasian male who is here for f/u recent worsening fatigue, disequilibrium, dyspnea, and acute urinary retention. He describes just feeling more and more SOB with walking, even sometimes at rest.  No fever, no signif cough, no chest pain, no peripheral swelling.  He admits his SOB is a waxing/waning feature of this that is not entirely predictable by activity. Recent addition of long acting beta agonist via nebulizer bid by Dr. [name] has helped per pt.   Dr. Evlyn Courier lab work at recent f/u visit revealed normal HCO3- but showed ARF.   I then called pt and asked him to begin doing i/o bladder cath b/c he has a hx of episodes of urinary retention.  This did reveal that he was having residuals in the 4 oz range, and i/o caths since then have even shown residuals as high as 9-12 oz.  Denies dyruria, urinary urgency, or urinary frequency.  He has no abdominal pain. He does admit to having to strain a lot to get normal/soft stool to evacuate from his colon, and must use miralax on a regular basis to get even this to occur.    Pertinent PMH:  Past Medical History  Diagnosis Date  . CAD (coronary artery disease)     post CABG with prior percutaneous intervention of the saphenous vein graft with known saphenous vein graft disease.  Marland Kitchen AAA (abdominal aortic aneurysm)     a. CTA 09/2011: 4.1-4.2cm infrarenal AAA.  Marland Kitchen Aneurysm of thoracic aorta 1995    a. Rupture 1995 w/spontaneous resolution;  b. CTA 09/2011: 6.8cm dist, 5.7cm mid  . Popliteal artery aneurysm   . Myocardial infarction   . Ischemic cardiomyopathy     10/2011 ECHO: EF 30-35%, apical akinesis  . Paroxysmal atrial fibrillation   . Hypertension   . Peripheral vascular disease   . Dyslipidemia   . Diabetes mellitus 2007    dx'd 2007 w/persisting FBS>125  . GERD (gastroesophageal reflux  disease)   . Hiatal hernia   . Diverticular disease   . BPH (benign prostatic hypertrophy)     with hx of acute urinary retention (pt can self-cath)  . COPD (chronic obstructive pulmonary disease)     05/09/08 PFT FEV1 1.84 (55%), FVC 4.42 (95%), TLC 6.46 (92%), DLCO 72%, no BD response.  Overnight oximetry 10/2011 normal on RA.  Marland Kitchen Pneumonia 1/09  . Allergic rhinitis   . Shingles     X 2 episodes, both in left scapula area  . OSA (obstructive sleep apnea) 06/14/09    PSG RDI 17, PLMI 96, intolerant of CPAP or BPAP  . Chronic renal insufficiency     Baseline Cr 1.5  . Prostate cancer 07/2008    Low grade; watchful waiting with [name]  . Pulmonary nodule, left 03/02/2010    Left base; resolved on f/u CT  . Allergic rhinitis   . Carpal tunnel syndrome of left wrist 02/11/2011  . IMPLANTATION OF DEFIBRILLATOR, HX OF 09/02/2008    Medtronic 7232Cx Maximo V. Had a 6949 lead - failed and therapies turned off.  . IRON DEFICIENCY 06/08/2010    Hemoccult negative:  GI ([name]) recommended no invasive diagnostics--watchful waiting (2012).  . UNSPECIFIED ANEMIA 06/01/2010    Multifactorial: iron def, CRI, vit B12 def,   . Hypotension 04/14/2012  . CHF (congestive  heart failure)   . Atrial fibrillation    Past surgical, social, and family history reviewed and no changes noted since last office visit.  MEDS:  Outpatient Prescriptions Prior to Visit  Medication Sig Dispense Refill  . acetaminophen (TYLENOL) 500 MG tablet Take 1,000 mg by mouth every 6 (six) hours as needed. For headache, pain or fever.      Marland Kitchen albuterol (PROVENTIL) (2.5 MG/3ML) 0.083% nebulizer solution Take 2.5 mg by nebulization every 6 (six) hours as needed. For shortness of breath      . amiodarone (PACERONE) 200 MG tablet Take 200 mg by mouth every morning.       Marland Kitchen aspirin EC 81 MG tablet Take 81 mg by mouth every morning.      . budesonide (PULMICORT) 0.25 MG/2ML nebulizer solution Take 2 mLs (0.25 mg total) by  nebulization 2 (two) times daily.  60 mL  5  . carvedilol (COREG) 3.125 MG tablet TAKE 1 TABLET BY MOUTH 2 TIMES A DAY  60 tablet  3  . cetirizine (ZYRTEC) 10 MG tablet Take 10 mg by mouth every morning.       . clotrimazole-betamethasone (LOTRISONE) cream Apply 1 application topically 2 (two) times daily as needed. For skin irritation      . cyanocobalamin 1000 MCG/ML injection Inject 1,000 mcg into the muscle every 30 (thirty) days.       Marland Kitchen desoximetasone (TOPICORT) 0.25 % cream Apply 1 application topically 2 (two) times daily as needed. For rash      . finasteride (PROSCAR) 5 MG tablet Take 5 mg by mouth at bedtime.       . fluticasone (FLONASE) 50 MCG/ACT nasal spray Place 2 sprays into the nose daily.  16 g  6  . formoterol (PERFOROMIST) 20 MCG/2ML nebulizer solution Take 2 mLs (20 mcg total) by nebulization 2 (two) times daily.  40 mL  5  . furosemide (LASIX) 40 MG tablet TAKE 1 TABLET BY MOUTH EVERY DAY  30 tablet  3  . guaiFENesin (MUCINEX) 600 MG 12 hr tablet Take 1,200 mg by mouth 2 (two) times daily as needed. For congestion      . guaiFENesin-dextromethorphan (ROBITUSSIN DM) 100-10 MG/5ML syrup Take 5 mLs by mouth 3 (three) times daily as needed. For cough.      Marland Kitchen ipratropium (ATROVENT) 0.03 % nasal spray Place 2 sprays into the nose 3 (three) times daily as needed. For allergies.      . isosorbide mononitrate (IMDUR) 60 MG 24 hr tablet Take 1 tablet (60 mg total) by mouth every morning.  30 tablet  11  . JANUVIA 100 MG tablet TAKE 1 TABLET BY MOUTH EVERY DAY  30 tablet  5  . levothyroxine (SYNTHROID, LEVOTHROID) 125 MCG tablet Take 0.5-1 tablets (62.5-125 mcg total) by mouth every other day. Alternate daily with a whole tablet ( ) or a half a tablet (6.88mcg)  30 tablet  5  . lithium carbonate 300 MG capsule Take 300 mg by mouth 2 (two) times daily with a meal.      . mirtazapine (REMERON) 15 MG tablet Take 1 tablet (15 mg total) by mouth at bedtime.  30 tablet  1  .  nitroGLYCERIN (NITROSTAT) 0.3 MG SL tablet Place 1 tablet (0.3 mg total) under the tongue every 5 (five) minutes as needed. For chest pain  25 tablet  2  . polyethylene glycol powder (GLYCOLAX/MIRALAX) powder 1 capful one to three times per day as needed for constipation  255  g  6  . potassium chloride (KLOR-CON M10) 10 MEQ tablet Take 10 mEq by mouth daily.      . ranitidine (ZANTAC) 150 MG tablet Take 150 mg by mouth 2 (two) times daily.      . Tamsulosin HCl (FLOMAX) 0.4 MG CAPS Take 0.8 mg by mouth every morning.       . tiotropium (SPIRIVA) 18 MCG inhalation capsule Place 1 capsule (18 mcg total) into inhaler and inhale every morning.  30 capsule  6  . warfarin (COUMADIN) 5 MG tablet Take 2.5-5 mg by mouth every evening. He takes one tablet (5mg ) on Monday and Friday and half a tablet (2.5mg ) on Tuesday, Wednesday, Thursday, Saturday and Sunday.      . levalbuterol (XOPENEX HFA) 45 MCG/ACT inhaler Inhale 1-2 puffs into the lungs every 4 (four) hours as needed. For shortness of breath  1 Inhaler  6   No facility-administered medications prior to visit.    PE: Blood pressure 126/76, pulse 57, temperature 97.4 F (36.3 C), temperature source Oral, resp. rate 18, weight 157 lb 12 oz (71.555 kg), SpO2 96.00%. Gen: Alert, well appearing.  Patient is oriented to person, place, time, and situation. ENT:   Eyes: no injection, icteris, swelling, or exudate.  EOMI, PERRLA. Nose: no drainage or turbinate edema/swelling.  No injection or focal lesion.  Mouth: lips without lesion/swelling.  Oral mucosa pink and moist. Oropharynx without erythema, exudate, or swelling.  CV: RRR, no murmur or rub.  S4 gallop. LUNGS: Clear on inspiration, but mild end exp wheeze noted on expiration, aeration is pretty good.  Breathing is nonlabored. ABD: soft, ND, NT EXT: no clubbing, cyanosis, or edema.  RECTAL: rectal tone is moderately diminished.  Prostate is diffusely enlarged but nontender.  No nodule or  induration.  LAB: none today. Cr 2.2 on 09/21/12 compared to 1.4 on blood work about 6 weeks prior.  IMPRESSION AND PLAN:  Fatigue Generalized worsening of his chronic complaints of DOE, dysp at rest, fatigue, and feeling of being off balance. Suspect this is from a combination of gradually progressive CHF, COPD, recent acute-on-chronic renal insufficiency, and ongoing polypharmacy. Will do echocardiogram (last was 10/2011).  No change in his meds were made today.  Depression His mood is pretty stable, considering all his health complaints lately.   Denies improvement in mood from remeron but he certainly has found that this med helps with his insomnia and his poor appetite..  Continue this med at current dosing for now.  Acute renal failure Working dx is obstructive nephropathy secondary to his latest bout of urinary retention. We'll recheck BMET today. He'll continue i/o cath bid as long as residual is 4 oz or less, and increase to tid if residual > 4 oz.  Urinary retention Acute on chronic, suspect this is from his BPH but agree with Urology's plan to do urodynamics. Continue i/o cath for now. If obstruction is the cause, he would likely need suprapubic cath placement b/c he is not a surgical candidate for TURP or prostatectomy. If obstruction not noted to be significant on urodynamics then we may need to do further w/u for neurogenic bladder.   An After Visit Summary was printed and given to the patient.  FOLLOW UP: 56mo

## 2012-09-26 NOTE — Assessment & Plan Note (Signed)
Working dx is obstructive nephropathy secondary to his latest bout of urinary retention. We'll recheck BMET today. He'll continue i/o cath bid as long as residual is 4 oz or less, and increase to tid if residual > 4 oz.

## 2012-09-26 NOTE — Assessment & Plan Note (Signed)
Generalized worsening of his chronic complaints of DOE, dysp at rest, fatigue, and feeling of being off balance. Suspect this is from a combination of gradually progressive CHF, COPD, recent acute-on-chronic renal insufficiency, and ongoing polypharmacy. Will do echocardiogram (last was 10/2011).  No change in his meds were made today.

## 2012-09-26 NOTE — Assessment & Plan Note (Signed)
Acute on chronic, suspect this is from his BPH but agree with Urology's plan to do urodynamics. Continue i/o cath for now. If obstruction is the cause, he would likely need suprapubic cath placement b/c he is not a surgical candidate for TURP or prostatectomy. If obstruction not noted to be significant on urodynamics then we may need to do further w/u for neurogenic bladder.

## 2012-09-28 NOTE — Telephone Encounter (Signed)
LMTCBx2. Jennifer Castillo, CMA  

## 2012-09-29 ENCOUNTER — Telehealth: Payer: Self-pay | Admitting: Cardiovascular Disease

## 2012-09-29 MED ORDER — POTASSIUM CHLORIDE ER 10 MEQ PO TBCR: 10.0000 meq | EXTENDED_RELEASE_TABLET | Freq: Every day | ORAL | Status: DC

## 2012-09-29 MED ORDER — CARVEDILOL 3.125 MG PO TABS: 3.1250 mg | ORAL_TABLET | Freq: Two times a day (BID) | ORAL | Status: DC

## 2012-09-29 MED ORDER — BUDESONIDE 0.25 MG/2ML IN SUSP: 0.2500 mg | Freq: Two times a day (BID) | RESPIRATORY_TRACT | Status: AC

## 2012-09-29 MED ORDER — ALBUTEROL SULFATE (2.5 MG/3ML) 0.083% IN NEBU: 2.5000 mg | INHALATION_SOLUTION | Freq: Four times a day (QID) | RESPIRATORY_TRACT | Status: AC | PRN

## 2012-09-29 NOTE — Telephone Encounter (Signed)
Spoke with patient who states he cannot get his prescriptions because they have Dr.Stuckey's name on them.  Patient states he is out of his Carvedilol and his KLOR.  Patient saw Dr. Excell Seltzer 4/29.  I am resending prescriptions with Dr. Earmon Phoenix name.  Patient verbalized understanding.

## 2012-09-29 NOTE — Telephone Encounter (Addendum)
ATC pt at home x2 - line busy Called pt's cell >> spoke with patient and discussed with him our findings below.  Advised that Medicare typically will not pay for nebulizer medications thru a local pharmacy but will do so thru a DME company.  Pt stated he uses a home care company but was unable to remember the name of this company (pt seemed confused and was difficult to understand on the phone)  Pt already taking budesonide.  After extensive search in pt's chart, found that he uses [facility].  This has been addeded to his Care Coordination Note.  Called spoke with Dois Davenport w/ [facility] who verified pt's account (NOTE: [facility] and [facility] have pt's dob listed as [birth year] > verified pt's identity using 2 other identifiers) and also stated that pt gets albuterol neb thru that company.  The Budesonide has not been filled since 12.23.13 for a 30 day supply and the albuterol 07/2010.  Pt called again and stated that he gets his albuterol neb thru [facility] but asked if I was calling about his perforomist from last ov.  Answered that yes, I was.  Pt stated that he has already [facility]ist [facility] and it cost him $40 for a 30day supply.  Apologized to pt for the confusion and told him that we were not aware that [facility] filled this for him as last documentation states that the rx was on hold.  Advised pt that this medication will likely NOT be any cheaper thru DME and to call back if he would like to switch to [facility] to supply.  Pt verbalized his understanding and denied any further questions.    Dr [name], per [facility] pt is non-compliant with his neb meds.  Would you like these refilled or discuss w/ pt first?  Please advise, thanks!

## 2012-09-29 NOTE — Telephone Encounter (Signed)
New Problem:    Patient called in wanting to speak to a nurse about his carvedilol (COREG) 3.125 MG tablet and potassium chloride (KLOR-CON M10) 10 MEQ tablet. Patient is unable to get his medication refilled because they still have Dr. Rosalyn Charters name on them.  Please call back.

## 2012-09-29 NOTE — Telephone Encounter (Signed)
Appears to me that pt is compliant with his medications, but he is getting them from multiple sources [facility] and his pharmacy).  Please confirm with pt which route of medication refill is best for him >> [facility] versus his pharmacy.  He needs to remain on scheduled performist and budesonide one vial nebulized bid, spiriva one puff daily, and prn albuterol qid prn.

## 2012-09-29 NOTE — Telephone Encounter (Signed)
I spoke with pt and is aware of VS recs. He stated he would prefer to go through the pharmacy since it is cheaper. RX's for budesonide and albuterol has been sent. Nothing further was needed

## 2012-10-01 ENCOUNTER — Ambulatory Visit (INDEPENDENT_AMBULATORY_CARE_PROVIDER_SITE_OTHER): Payer: Medicare Other | Admitting: *Deleted

## 2012-10-01 ENCOUNTER — Ambulatory Visit (HOSPITAL_COMMUNITY): Payer: Medicare Other | Attending: Cardiology | Admitting: Radiology

## 2012-10-01 ENCOUNTER — Other Ambulatory Visit: Payer: Self-pay | Admitting: *Deleted

## 2012-10-01 DIAGNOSIS — J4489 Other specified chronic obstructive pulmonary disease: Secondary | ICD-10-CM | POA: Insufficient documentation

## 2012-10-01 DIAGNOSIS — Z7901 Long term (current) use of anticoagulants: Secondary | ICD-10-CM

## 2012-10-01 DIAGNOSIS — R0609 Other forms of dyspnea: Secondary | ICD-10-CM | POA: Insufficient documentation

## 2012-10-01 DIAGNOSIS — J449 Chronic obstructive pulmonary disease, unspecified: Secondary | ICD-10-CM | POA: Insufficient documentation

## 2012-10-01 DIAGNOSIS — I509 Heart failure, unspecified: Secondary | ICD-10-CM

## 2012-10-01 DIAGNOSIS — I2510 Atherosclerotic heart disease of native coronary artery without angina pectoris: Secondary | ICD-10-CM | POA: Insufficient documentation

## 2012-10-01 DIAGNOSIS — I059 Rheumatic mitral valve disease, unspecified: Secondary | ICD-10-CM | POA: Insufficient documentation

## 2012-10-01 DIAGNOSIS — Z87891 Personal history of nicotine dependence: Secondary | ICD-10-CM | POA: Insufficient documentation

## 2012-10-01 DIAGNOSIS — R5381 Other malaise: Secondary | ICD-10-CM | POA: Insufficient documentation

## 2012-10-01 DIAGNOSIS — R0989 Other specified symptoms and signs involving the circulatory and respiratory systems: Secondary | ICD-10-CM | POA: Insufficient documentation

## 2012-10-01 DIAGNOSIS — E119 Type 2 diabetes mellitus without complications: Secondary | ICD-10-CM | POA: Insufficient documentation

## 2012-10-01 DIAGNOSIS — N289 Disorder of kidney and ureter, unspecified: Secondary | ICD-10-CM | POA: Insufficient documentation

## 2012-10-01 DIAGNOSIS — I4891 Unspecified atrial fibrillation: Secondary | ICD-10-CM

## 2012-10-01 MED ORDER — LEVALBUTEROL TARTRATE 45 MCG/ACT IN AERO: INHALATION_SPRAY | RESPIRATORY_TRACT | Status: DC

## 2012-10-01 NOTE — Progress Notes (Signed)
Echocardiogram performed.  

## 2012-10-01 NOTE — Telephone Encounter (Signed)
Rx request to pharmacy; note: PLEASE VERIFY DOB MEDICAL RECORD [birth year]/SLS

## 2012-10-02 ENCOUNTER — Other Ambulatory Visit: Payer: Self-pay | Admitting: Family Medicine

## 2012-10-02 DIAGNOSIS — N189 Chronic kidney disease, unspecified: Secondary | ICD-10-CM

## 2012-10-07 ENCOUNTER — Telehealth: Payer: Self-pay | Admitting: Family Medicine

## 2012-10-07 ENCOUNTER — Telehealth: Payer: Self-pay | Admitting: Cardiovascular Disease

## 2012-10-07 ENCOUNTER — Other Ambulatory Visit: Payer: Self-pay | Admitting: Family Medicine

## 2012-10-07 DIAGNOSIS — R809 Proteinuria, unspecified: Secondary | ICD-10-CM

## 2012-10-07 DIAGNOSIS — R7989 Other specified abnormal findings of blood chemistry: Secondary | ICD-10-CM

## 2012-10-07 NOTE — Telephone Encounter (Signed)
New Problem:    Called in concerned because the patient has been passing blood clots in his urine while at the urologists office and would like to know how to proceed.  PAtient is on coumadin.  Please call back.

## 2012-10-07 NOTE — Telephone Encounter (Signed)
Pls call pt and tell him I would like him to come in for lab visit to recheck kidney function and check a urine sample at his earliest convenience.  Order for blood is entered but pls have Kristi enter POCT UA order when he gets here for lab visit (dx are elevated serum creatinine and proteinuria).--thx

## 2012-10-07 NOTE — Telephone Encounter (Signed)
I spoke with the pt and he has had small blood clots in the past due to his recent urinary problems.  Yesterday the pt noticed "large" amounts of blood and clots when he urinated.  The pt does not have this when he performs his catheterizations. The pt did contact Urology today and they advised him to contact the [facility].  The pt is suppose to contact Urology tomorrow to arrange an appointment for evaluation. The pt states that our office can contact Fabienne Bruns PA or Dr Mena Goes with questions. I will forward this message to the coumadin clinic to make them aware that the pt is having bleeding issues and have them follow-up with Urology to determine if the pt needs to hold coumadin.

## 2012-10-08 ENCOUNTER — Other Ambulatory Visit (INDEPENDENT_AMBULATORY_CARE_PROVIDER_SITE_OTHER): Payer: Medicare Other

## 2012-10-08 DIAGNOSIS — R7989 Other specified abnormal findings of blood chemistry: Secondary | ICD-10-CM

## 2012-10-08 DIAGNOSIS — R799 Abnormal finding of blood chemistry, unspecified: Secondary | ICD-10-CM

## 2012-10-08 DIAGNOSIS — R809 Proteinuria, unspecified: Secondary | ICD-10-CM

## 2012-10-08 LAB — BASIC METABOLIC PANEL
BUN: 29 mg/dL — ABNORMAL HIGH (ref 6–23)
CO2: 22 mEq/L (ref 19–32)
Glucose, Bld: 140 mg/dL — ABNORMAL HIGH (ref 70–99)
Potassium: 3.8 mEq/L (ref 3.5–5.1)
Sodium: 137 mEq/L (ref 135–145)

## 2012-10-08 LAB — POCT URINALYSIS DIPSTICK
Bilirubin, UA: NEGATIVE
Glucose, UA: NEGATIVE
Ketones, UA: NEGATIVE
Nitrite, UA: NEGATIVE
Spec Grav, UA: 1.01
pH, UA: 6.5

## 2012-10-08 NOTE — Progress Notes (Signed)
LABS ONLY  

## 2012-10-08 NOTE — Patient Instructions (Signed)
Labs only

## 2012-10-08 NOTE — Telephone Encounter (Signed)
LMOM for pt or spouse to call back to [place] to let us know what Urology plans are, and if coumadin needs to be held.

## 2012-10-09 ENCOUNTER — Other Ambulatory Visit: Payer: Self-pay | Admitting: *Deleted

## 2012-10-09 ENCOUNTER — Telehealth: Payer: Self-pay | Admitting: *Deleted

## 2012-10-09 ENCOUNTER — Telehealth: Payer: Self-pay | Admitting: Family Medicine

## 2012-10-09 ENCOUNTER — Other Ambulatory Visit: Payer: Self-pay | Admitting: Cardiovascular Disease

## 2012-10-09 MED ORDER — AMIODARONE HCL 200 MG PO TABS: 200.0000 mg | ORAL_TABLET | Freq: Every morning | ORAL | Status: AC

## 2012-10-09 NOTE — Telephone Encounter (Signed)
Pt calls back and states that he is having a test at [facility] and is not sure if he is to hold his coumadin or not. He states the name of test is a Urodynamic test. Will call [facility] to find out if pt is to hold coumadin. Spoke with Steward Drone at [facility] and she states pt does not have to hold coumadin for upcoming Urodynamic test.

## 2012-10-09 NOTE — Telephone Encounter (Signed)
S/w pt bday is August 07, 1935 in epic and 04/20/1936 on paper script sent in by pharm pt stated to much to change bday so keeps it a month apart for insurance purposes. Will fill amiodarone pt stated dose is (200 mg ) daily [facility] [place]

## 2012-10-09 NOTE — Telephone Encounter (Signed)
Talked to pt on phone just now. He was apparently unaware that urology did not think his recent renal failure was due to a urologic problem. I explained things to him again and reiterated the importance of getting in with nephrologist ASAP and he expressed understanding and will be glad to go.  Diane, will you call the nephrologist's office back and tell them to pursue an appt with him again--first available.--thx!  (Also, send their office his most recent labs and urinalysis--done this week).

## 2012-10-15 ENCOUNTER — Ambulatory Visit (INDEPENDENT_AMBULATORY_CARE_PROVIDER_SITE_OTHER): Payer: Medicare Other

## 2012-10-15 DIAGNOSIS — Z7901 Long term (current) use of anticoagulants: Secondary | ICD-10-CM

## 2012-10-15 DIAGNOSIS — I4891 Unspecified atrial fibrillation: Secondary | ICD-10-CM

## 2012-10-20 NOTE — Telephone Encounter (Signed)
New rpoblem    Upcoming Turp procedure . Need cardiac clearance.

## 2012-10-21 NOTE — Telephone Encounter (Signed)
I left a message for Pam to call the office in regards to surgery.

## 2012-10-21 NOTE — Telephone Encounter (Signed)
I spoke with Pam and the pt is not scheduled for TURP at this time.  Dr Mena Goes is awaiting cardiac clearance and directions from the Cardiologist in regards to Coumadin. The pt's procedure will most likely be scheduled in July with a one night stay at [facility].  Once clearance is obtained this should be faxed to Pam at [phone]. I will forward this message to Dr Excell Seltzer for review.

## 2012-10-21 NOTE — Telephone Encounter (Signed)
Called the patient to discuss cardiac clearance with him. He was recently seen in the office and had no anginal symptoms. However, his risk is clearly increased in the setting of his cardiomyopathy and known coronary disease with multiple bypass grafts occluded. He also has a large aneurysm that is being managed medically. He was not home tonight, but I did speak with his wife. I will contact him in the next few days to discuss further. I don't think further testing will help with risk stratification. He is going to need to make an informed decision about surgery. It sounds like surgery needs to be done as he is self catheterizing and having hematuria.  Tonny Bollman 10/21/2012 6:09 PM

## 2012-10-26 ENCOUNTER — Telehealth: Payer: Self-pay | Admitting: *Deleted

## 2012-10-26 MED ORDER — MIRTAZAPINE 15 MG PO TABS: 15.0000 mg | ORAL_TABLET | Freq: Every day | ORAL | Status: DC

## 2012-10-26 NOTE — Telephone Encounter (Signed)
Fax request for Mirtazapine Last Rx: 04.17.14, #30x2 Last OV: 05.16.14 Return: 58-Month, needs f/u appointment Rx sent to pharmacy per [facility] refill protocol/SLS

## 2012-10-29 ENCOUNTER — Telehealth: Payer: Self-pay | Admitting: Cardiovascular Disease

## 2012-10-29 NOTE — Telephone Encounter (Signed)
New problem    Status of cardiac clearance .  

## 2012-10-29 NOTE — Telephone Encounter (Signed)
Roberto [name] at [facility] that Dr. Excell Seltzer and Leotis Shames are unavailable until next week and this request will be addressed then.

## 2012-11-02 NOTE — Telephone Encounter (Signed)
New Prob     Would like to speak to nurse regard a procedure he has. Please call.

## 2012-11-02 NOTE — Telephone Encounter (Signed)
Spoke with patient who wants to know if cardiac clearance has been done for his urology procedure.  Patient states he called last week and was told that Dr. Excell Seltzer and Leotis Shames would be back this week.  I advised patient that they are not in the office today and that I will send message to them.  Patient verbalized understanding and said that Lauren can call him if she has any questions.

## 2012-11-04 ENCOUNTER — Ambulatory Visit (INDEPENDENT_AMBULATORY_CARE_PROVIDER_SITE_OTHER): Payer: Medicare Other | Admitting: Nurse Practitioner

## 2012-11-04 ENCOUNTER — Encounter: Payer: Self-pay | Admitting: Nurse Practitioner

## 2012-11-04 ENCOUNTER — Telehealth: Payer: Self-pay | Admitting: Cardiovascular Disease

## 2012-11-04 VITALS — BP 110/80 | HR 76 | Temp 98.1°F | Ht 70.0 in | Wt 156.2 lb

## 2012-11-04 DIAGNOSIS — R8290 Unspecified abnormal findings in urine: Secondary | ICD-10-CM

## 2012-11-04 DIAGNOSIS — N390 Urinary tract infection, site not specified: Secondary | ICD-10-CM

## 2012-11-04 DIAGNOSIS — R82998 Other abnormal findings in urine: Secondary | ICD-10-CM

## 2012-11-04 LAB — POCT URINALYSIS DIPSTICK
Bilirubin, UA: NEGATIVE
Glucose, UA: NEGATIVE
Ketones, UA: NEGATIVE
Protein, UA: NEGATIVE

## 2012-11-04 MED ORDER — SULFAMETHOXAZOLE-TRIMETHOPRIM 800-160 MG PO TABS: 1.0000 | ORAL_TABLET | Freq: Two times a day (BID) | ORAL | Status: DC

## 2012-11-04 NOTE — Progress Notes (Signed)
Subjective:    Roberto [name] is a 77 y.o. male who complains of pain in the lower abdomen and cloudy urine. He has had symptoms for 1 day. Patient also complains of nausea times 1 yesterday relieved after he took 4 tums.. Patient denies back pain and fever. Patient does not have a history of recurrent UTI. Patient does not have a history of pyelonephritis. He does have enlarged prostate and has been instructed by urology to self-catherize once daily in am in order to facilitate complete emptying of bladder. The following portions of the patient's history were reviewed and updated as appropriate: allergies, current medications, past medical history, past surgical history and problem list.  Review of Systems Constitutional: negative for chills, fatigue, fevers and night sweats Respiratory: positive for cough and wheezing Cardiovascular: negative for chest pain, chest pressure/discomfort, exertional chest pressure/discomfort, palpitations and syncope Gastrointestinal: positive for abdominal pain Genitourinary:positive for cloudy urine    Objective:    BP 110/80  Pulse 76  Temp(Src) 98.1 F (36.7 C) (Oral)  Ht 5\' 10"  (1.778 m)  Wt 156 lb 4 oz (70.875 kg)  BMI 22.42 kg/m2  SpO2 95% General appearance: alert, cooperative, appears stated age and no distress Head: Normocephalic, without obvious abnormality, atraumatic Lungs: wheezes posterior - right Heart: regular rate and rhythm, S1, S2 normal, no murmur, click, rub or gallop Abdomen: soft, no HSM, slight bulge R umbilical area, c/o tenderness L periumbilical area. Extremities: extremities normal, atraumatic, no cyanosis or edema  Laboratory:  Urine dipstick: pos for mod leukocytes & trace blood.   Micro exam: not done.    Assessment:  1.  Acute cystitis, probably r/t daily self-cath. 2.wheeze-pt states always has wheeze      Plan:  1. Medications: TMP/SMX. Maintain adequate hydration. Follow up if symptoms not improving, and as  needed.  May need to adjust coumadin-has appt tomorrow at coumadin clinic.  May see urology later this week or next week for prostate surgery as cardiology gave clearance today, if so, will f/u with urology re cystitis. 2.Continue COPD meds. Seek re-eval if become SOB.

## 2012-11-04 NOTE — Patient Instructions (Addendum)
Urinary Tract Infection  Urinary tract infections (UTIs) can develop anywhere along your urinary tract. Your urinary tract is your body's drainage system for removing wastes and extra water. Your urinary tract includes two kidneys, two ureters, a bladder, and a urethra. Your kidneys are a pair of bean-shaped organs. Each kidney is about the size of your fist. They are located below your ribs, one on each side of your spine.  CAUSES  Infections are caused by microbes, which are microscopic organisms, including fungi, viruses, and bacteria. These organisms are so small that they can only be seen through a microscope. Bacteria are the microbes that most commonly cause UTIs.  SYMPTOMS   Symptoms of UTIs may vary by age and gender of the patient and by the location of the infection. Symptoms in young women typically include a frequent and intense urge to urinate and a painful, burning feeling in the bladder or urethra during urination. Older women and men are more likely to be tired, shaky, and weak and have muscle aches and abdominal pain. A fever may mean the infection is in your kidneys. Other symptoms of a kidney infection include pain in your back or sides below the ribs, nausea, and vomiting.  DIAGNOSIS  To diagnose a UTI, your caregiver will ask you about your symptoms. Your caregiver also will ask to provide a urine sample. The urine sample will be tested for bacteria and white blood cells. White blood cells are made by your body to help fight infection.  TREATMENT   Typically, UTIs can be treated with medication. Because most UTIs are caused by a bacterial infection, they usually can be treated with the use of antibiotics. The choice of antibiotic and length of treatment depend on your symptoms and the type of bacteria causing your infection.  HOME CARE INSTRUCTIONS   If you were prescribed antibiotics, take them exactly as your caregiver instructs you. Finish the medication even if you feel better after you  have only taken some of the medication.   Drink enough water and fluids to keep your urine clear or pale yellow.   Avoid caffeine, tea, and carbonated beverages. They tend to irritate your bladder.   Empty your bladder often. Avoid holding urine for long periods of time.   Empty your bladder before and after sexual intercourse.   After a bowel movement, women should cleanse from front to back. Use each tissue only once.  SEEK MEDICAL CARE IF:    You have back pain.   You develop a fever.   Your symptoms do not begin to resolve within 3 days.  SEEK IMMEDIATE MEDICAL CARE IF:    You have severe back pain or lower abdominal pain.   You develop chills.   You have nausea or vomiting.   You have continued burning or discomfort with urination.  MAKE SURE YOU:    Understand these instructions.   Will watch your condition.   Will get help right away if you are not doing well or get worse.  Document Released: 02/06/2005 Document Revised: 10/29/2011 Document Reviewed: 06/07/2011  [employer] Patient Information 2014 [employer].

## 2012-11-04 NOTE — Telephone Encounter (Signed)
Left message will see pt tomorrow and check INR, instructed to bring bottle of antibiotics when he comes in for appt.

## 2012-11-04 NOTE — Telephone Encounter (Signed)
New Problem:     Patient's wife called in because the patient is going to be placed on an antibiotic and would like to know if he needed to make any special preparations.  Please call back.

## 2012-11-05 ENCOUNTER — Ambulatory Visit (INDEPENDENT_AMBULATORY_CARE_PROVIDER_SITE_OTHER): Payer: Medicare Other | Admitting: *Deleted

## 2012-11-05 ENCOUNTER — Other Ambulatory Visit: Payer: Self-pay | Admitting: Urology

## 2012-11-05 DIAGNOSIS — Z7901 Long term (current) use of anticoagulants: Secondary | ICD-10-CM

## 2012-11-05 DIAGNOSIS — I4891 Unspecified atrial fibrillation: Secondary | ICD-10-CM

## 2012-11-05 LAB — URINALYSIS, MICROSCOPIC ONLY

## 2012-11-05 LAB — URINALYSIS, ROUTINE W REFLEX MICROSCOPIC
Bilirubin Urine: NEGATIVE
Hgb urine dipstick: NEGATIVE
Ketones, ur: NEGATIVE mg/dL
Nitrite: NEGATIVE
Urobilinogen, UA: 0.2 mg/dL (ref 0.0–1.0)

## 2012-11-05 LAB — POCT INR: INR: 1.9

## 2012-11-05 NOTE — Patient Instructions (Addendum)
11/28/2012 last day to take coumadin  11/29/2012 no coumadin 11/30/2012  No coumadin 12/01/2012 no coumadin 12/02/2012 no coumadin 12/03/2012 no coumadin 12/04/2012 no coumadin day of procedure  Then when instructed by physician doing surgery to restart coumadin take extra 1/2 tablet with normal dose for 2 days then recheck in  Coumadin clinic  on August 1st

## 2012-11-05 NOTE — Telephone Encounter (Signed)
New problem   Pam/[facility] need to know if they can hold coumadin and if so for how long. Prior to sx.

## 2012-11-05 NOTE — Telephone Encounter (Signed)
Per Dr Excell Seltzer the pt can hold coumadin 5 days prior to surgery.  Pam aware and I will fax this phone note and recent office note with addendum to [facility].

## 2012-11-05 NOTE — Telephone Encounter (Signed)
This message was handled in 6/19 phone note.

## 2012-11-07 LAB — URINE CULTURE: Colony Count: >=100000

## 2012-11-09 ENCOUNTER — Telehealth: Payer: Self-pay | Admitting: Nurse Practitioner

## 2012-11-09 ENCOUNTER — Encounter: Payer: Self-pay | Admitting: Internal Medicine

## 2012-11-09 NOTE — Telephone Encounter (Signed)
Pt is to stop antibiotic, if asymtpomatic other than cloudy urine. Cloudy urine is to be expected from time to time as pt is colonized with pseudomonas. Will not treat unless symptoms other than cloudy urine.

## 2012-11-09 NOTE — Telephone Encounter (Signed)
Told pt to stop antibiotics.  Cloudy urine is to be expected from time to time.  Pt states he is finished with antibiotics and does not have any other symptoms, just cloudy urine.

## 2012-11-10 ENCOUNTER — Other Ambulatory Visit: Payer: Self-pay | Admitting: Family Medicine

## 2012-11-10 MED ORDER — FUROSEMIDE 40 MG PO TABS: ORAL_TABLET | ORAL | Status: DC

## 2012-11-10 NOTE — Telephone Encounter (Signed)
Gave pt one month refill of furosemide 40mg .  Pt must make an appointment for future refills.

## 2012-11-19 ENCOUNTER — Other Ambulatory Visit: Payer: Self-pay

## 2012-11-23 ENCOUNTER — Encounter: Payer: Self-pay | Admitting: Pulmonary Disease

## 2012-11-23 ENCOUNTER — Ambulatory Visit (INDEPENDENT_AMBULATORY_CARE_PROVIDER_SITE_OTHER): Payer: Medicare Other | Admitting: Pulmonary Disease

## 2012-11-23 VITALS — BP 102/72 | HR 60 | Temp 97.8°F | Ht 71.5 in | Wt 157.0 lb

## 2012-11-23 DIAGNOSIS — Z01811 Encounter for preprocedural respiratory examination: Secondary | ICD-10-CM | POA: Insufficient documentation

## 2012-11-23 DIAGNOSIS — J310 Chronic rhinitis: Secondary | ICD-10-CM

## 2012-11-23 DIAGNOSIS — J449 Chronic obstructive pulmonary disease, unspecified: Secondary | ICD-10-CM

## 2012-11-23 NOTE — Assessment & Plan Note (Signed)
Improved with transition to formeterol/budesonide via nebulizer.  Continue this with spiriva.

## 2012-11-23 NOTE — Assessment & Plan Note (Signed)
He is schedule for transurethral removal of prostate with spinal anesthesia.  I think he can proceed with surgery.  He does have history of COPD and OSA.  He has not been tolerant of CPAP.  He will need to continue with his inhaler and nebulizer regimen during the post-operative period.  He will need close monitoring of his oxygenation during the post-operative period.  Pulmonary service can be available if needed after surgery.

## 2012-11-23 NOTE — Progress Notes (Addendum)
Chief Complaint  Patient presents with  . COPD    Breathing is unchanged. Reports SOB. Denies chest tightness, coughing or wheezing.    History of Present Illness: CAEDON BOND is a 77 y.o. male with COPD, and rhinitis.  He is not as hoarse as before.  He is not having chest congestion or wheezing.  He still gets occasional cough.  He gets winded with minimal activity.  He is followed by Dr. Jerilee Field for prostate problems, and is schedule for TURP.  He was told he will have spinal anesthesia.  Tests: PFT 05/09/08 >>FEV1 1.84 (55%), FVC 4.42 (95%), TLC 6.46 (92%), DLCO 72%, no BD response Spirometry 08/28/11>>FEV1 1.51 (43%), FEV1% 48 CT chest 10/01/11>>6.5 cm descending TAA Echo 10/01/12 >> mild LVH, EF 30 to 35%, mild MR, mild LA dilation   He  has a past medical history of CAD (coronary artery disease); AAA (abdominal aortic aneurysm); Aneurysm of thoracic aorta (1995); Popliteal artery aneurysm; Myocardial infarction; Ischemic cardiomyopathy; Paroxysmal atrial fibrillation; Hypertension; Peripheral vascular disease; Dyslipidemia; Diabetes mellitus (2007); GERD (gastroesophageal reflux disease); Hiatal hernia; Diverticular disease; BPH (benign prostatic hypertrophy); COPD (chronic obstructive pulmonary disease); Pneumonia (1/09); Allergic rhinitis; Shingles; OSA (obstructive sleep apnea) (06/14/09); Chronic renal insufficiency; Prostate cancer (07/2008); Pulmonary nodule, left (03/02/2010); Allergic rhinitis; Carpal tunnel syndrome of left wrist (02/11/2011); IMPLANTATION OF DEFIBRILLATOR, HX OF (09/02/2008); IRON DEFICIENCY (06/08/2010); UNSPECIFIED ANEMIA (06/01/2010); Hypotension (04/14/2012); CHF (congestive heart failure); and Atrial fibrillation.   He  has past surgical history that includes Cardiac defibrillator placement (08/28/2004); Colon surgery (1995); Coronary artery bypass graft (1996); Aorta - bilateral femoral artery bypass graft (1999); Nose surgery; Insertion of pacing  lead; Abdominal aortic aneurysm repair (1999); Stents; Radiofrequency ablation (2006); and Esophagogastroduodenoscopy (egd) with esophageal dilation (05/28/2012).   Current Outpatient Prescriptions on File Prior to Visit  Medication Sig Dispense Refill  . acetaminophen (TYLENOL) 500 MG tablet Take 1,000 mg by mouth every 6 (six) hours as needed. For headache, pain or fever.      Marland Kitchen albuterol (PROVENTIL) (2.5 MG/3ML) 0.083% nebulizer solution Take 3 mLs (2.5 mg total) by nebulization 4 (four) times daily as needed. Dx 496  360 mL  5  . amiodarone (PACERONE) 200 MG tablet Take 1 tablet (200 mg total) by mouth every morning.  90 tablet  3  . aspirin EC 81 MG tablet Take 81 mg by mouth every morning.      . budesonide (PULMICORT) 0.25 MG/2ML nebulizer solution Take 2 mLs (0.25 mg total) by nebulization 2 (two) times daily.  120 mL  5  . carvedilol (COREG) 3.125 MG tablet Take 1 tablet (3.125 mg total) by mouth 2 (two) times daily.  180 tablet  3  . cetirizine (ZYRTEC) 10 MG tablet Take 10 mg by mouth every morning.       . clotrimazole-betamethasone (LOTRISONE) cream Apply 1 application topically 2 (two) times daily as needed. For skin irritation      . cyanocobalamin 1000 MCG/ML injection Inject 1,000 mcg into the muscle every 30 (thirty) days.       Marland Kitchen desoximetasone (TOPICORT) 0.25 % cream Apply 1 application topically 2 (two) times daily as needed. For rash      . finasteride (PROSCAR) 5 MG tablet Take 5 mg by mouth at bedtime.       . fluticasone (FLONASE) 50 MCG/ACT nasal spray Place 2 sprays into the nose daily.  16 g  6  . formoterol (PERFOROMIST) 20 MCG/2ML nebulizer solution Take 2 mLs (20 mcg  total) by nebulization 2 (two) times daily.  40 mL  5  . furosemide (LASIX) 40 MG tablet TAKE 1 TABLET BY MOUTH EVERY DAY  30 tablet  0  . guaiFENesin (MUCINEX) 600 MG 12 hr tablet Take 1,200 mg by mouth 2 (two) times daily as needed. For congestion      . guaiFENesin-dextromethorphan (ROBITUSSIN DM)  100-10 MG/5ML syrup Take 5 mLs by mouth 3 (three) times daily as needed. For cough.      Marland Kitchen ipratropium (ATROVENT) 0.03 % nasal spray Place 2 sprays into the nose 3 (three) times daily as needed. For allergies.      . isosorbide mononitrate (IMDUR) 60 MG 24 hr tablet Take 1 tablet (60 mg total) by mouth every morning.  30 tablet  11  . JANUVIA 100 MG tablet TAKE 1 TABLET BY MOUTH EVERY DAY  30 tablet  5  . levalbuterol (XOPENEX HFA) 45 MCG/ACT inhaler INHALE 1-2 PUFFS INTO THE LUNGS EVERY 4 (FOUR) HOURS AS NEEDED. FOR SHORTNESS OF BREATH  15 g  5  . levothyroxine (SYNTHROID, LEVOTHROID) 125 MCG tablet Take 0.5-1 tablets (62.5-125 mcg total) by mouth every other day. Alternate daily with a whole tablet ( ) or a half a tablet (6.30mcg)  30 tablet  5  . lithium carbonate 300 MG capsule Take 300 mg by mouth 2 (two) times daily with a meal.      . mirtazapine (REMERON) 15 MG tablet Take 1 tablet (15 mg total) by mouth at bedtime.  30 tablet  1  . nitroGLYCERIN (NITROSTAT) 0.3 MG SL tablet Place 1 tablet (0.3 mg total) under the tongue every 5 (five) minutes as needed. For chest pain  25 tablet  2  . polyethylene glycol powder (GLYCOLAX/MIRALAX) powder 1 capful one to three times per day as needed for constipation  255 g  6  . potassium chloride (K-DUR) 10 MEQ tablet Take 1 tablet (10 mEq total) by mouth daily.  90 tablet  3  . ranitidine (ZANTAC) 150 MG tablet Take 150 mg by mouth 2 (two) times daily.      Marland Kitchen sulfamethoxazole-trimethoprim (BACTRIM DS,SEPTRA DS) 800-160 MG per tablet Take 1 tablet by mouth 2 (two) times daily.  10 tablet  0  . Tamsulosin HCl (FLOMAX) 0.4 MG CAPS Take 0.8 mg by mouth every morning.       . tiotropium (SPIRIVA) 18 MCG inhalation capsule Place 1 capsule (18 mcg total) into inhaler and inhale every morning.  30 capsule  6  . warfarin (COUMADIN) 5 MG tablet Take 2.5-5 mg by mouth every evening. He takes one tablet (5mg ) on Monday and Friday and half a tablet (2.5mg ) on  Tuesday, Wednesday, Thursday, Saturday and Sunday.       No current facility-administered medications on file prior to visit.    Allergies  Allergen Reactions  . Atorvastatin     REACTION: muscle ache in legs  . Crestor (Rosuvastatin Calcium) Other (See Comments)    Lower ext fatigue and soreness- if taken everyday  . Lisinopril Diarrhea  . Morphine     REACTION: AMS - agitation  . Prednisone     REACTION: Can't breath  . Trazodone And Nefazodone Other (See Comments)    "drunk"  . Zolpidem Tartrate     REACTION: AMS    Physical Exam:  General - thin HEENT - No sinus tenderness, no oral exudate, no LAN  Cardiac - s1s2 no murmur  Chest - diminished breath sounds, no wheeze/rales  Abd -  soft, nontender  Ext - no edema  Neuro - normal strength Psych - normal behavior/mood  Skin - multiple ecchysmoses    Assessment/Plan:  Coralyn Helling, MD [facility] Pager:  [phone]

## 2012-11-23 NOTE — Assessment & Plan Note (Signed)
Continue prn atrovent nasal spray.

## 2012-11-23 NOTE — Patient Instructions (Signed)
Follow up in 4 months 

## 2012-11-27 ENCOUNTER — Telehealth: Payer: Self-pay | Admitting: *Deleted

## 2012-11-27 NOTE — Telephone Encounter (Signed)
Patient states he was started on amoxicillin for abscessed tooth, informed him this would not interfere with his INR. He is to take his last dose coumadin tomorrow for a procedure on 12/04/2012, instructions given with last anticoagulation visit.

## 2012-11-30 ENCOUNTER — Encounter (HOSPITAL_COMMUNITY): Payer: Self-pay | Admitting: Pharmacy Technician

## 2012-11-30 NOTE — Patient Instructions (Signed)
EMILEO SEMEL  11/30/2012   Your procedure is scheduled on:  12/04/12               Surgery 0730am-0930am  Report to [facility] at   0530  AM.  Call this number if you have problems the morning of surgery: [phone]   Remember:   Do not eat food or drink liquids after midnight.   Take these medicines the morning of surgery with A SIP OF WATER:    Do not wear jewelry  Do not wear lotions, powders, or perfumes.  . Men may shave face and neck.  Do not bring valuables to the hospital.  Contacts, dentures or bridgework may not be worn into surgery.  Leave suitcase in the car. After surgery it may be brought to your room.  For patients admitted to the hospital, checkout time is 11:00 AM the day of  discharge.     SEE CHG INSTRUCTION SHEET    Please read over the following fact sheets that you were given: , coughing and deep breathing exercises, leg exercises               Failure to comply with these instructions may result in cancellation of your surgery.                Patient Signature ____________________________              Nurse Signature _____________________________

## 2012-12-01 ENCOUNTER — Encounter (HOSPITAL_COMMUNITY): Payer: Self-pay

## 2012-12-01 ENCOUNTER — Encounter (HOSPITAL_COMMUNITY)
Admission: RE | Admit: 2012-12-01 | Discharge: 2012-12-01 | Disposition: A | Discharge status: Home / Self Care | Payer: Medicare Other | Source: Ambulatory Visit | Attending: Urology | Admitting: Urology

## 2012-12-01 DIAGNOSIS — Z7901 Long term (current) use of anticoagulants: Secondary | ICD-10-CM

## 2012-12-01 DIAGNOSIS — E872 Acidosis, unspecified: Secondary | ICD-10-CM | POA: Diagnosis not present

## 2012-12-01 DIAGNOSIS — I2510 Atherosclerotic heart disease of native coronary artery without angina pectoris: Secondary | ICD-10-CM | POA: Diagnosis present

## 2012-12-01 DIAGNOSIS — Y836 Removal of other organ (partial) (total) as the cause of abnormal reaction of the patient, or of later complication, without mention of misadventure at the time of the procedure: Secondary | ICD-10-CM | POA: Diagnosis not present

## 2012-12-01 DIAGNOSIS — I5022 Chronic systolic (congestive) heart failure: Secondary | ICD-10-CM | POA: Diagnosis present

## 2012-12-01 DIAGNOSIS — E876 Hypokalemia: Secondary | ICD-10-CM | POA: Diagnosis not present

## 2012-12-01 DIAGNOSIS — N401 Enlarged prostate with lower urinary tract symptoms: Principal | ICD-10-CM | POA: Diagnosis present

## 2012-12-01 DIAGNOSIS — N182 Chronic kidney disease, stage 2 (mild): Secondary | ICD-10-CM | POA: Diagnosis present

## 2012-12-01 DIAGNOSIS — E119 Type 2 diabetes mellitus without complications: Secondary | ICD-10-CM | POA: Diagnosis present

## 2012-12-01 DIAGNOSIS — Z79899 Other long term (current) drug therapy: Secondary | ICD-10-CM

## 2012-12-01 DIAGNOSIS — Z87891 Personal history of nicotine dependence: Secondary | ICD-10-CM

## 2012-12-01 DIAGNOSIS — T380X5A Adverse effect of glucocorticoids and synthetic analogues, initial encounter: Secondary | ICD-10-CM | POA: Diagnosis not present

## 2012-12-01 DIAGNOSIS — Z950 Presence of cardiac pacemaker: Secondary | ICD-10-CM

## 2012-12-01 DIAGNOSIS — J95821 Acute postprocedural respiratory failure: Secondary | ICD-10-CM | POA: Diagnosis not present

## 2012-12-01 DIAGNOSIS — J441 Chronic obstructive pulmonary disease with (acute) exacerbation: Secondary | ICD-10-CM | POA: Diagnosis present

## 2012-12-01 DIAGNOSIS — G4733 Obstructive sleep apnea (adult) (pediatric): Secondary | ICD-10-CM | POA: Diagnosis present

## 2012-12-01 DIAGNOSIS — E785 Hyperlipidemia, unspecified: Secondary | ICD-10-CM | POA: Diagnosis present

## 2012-12-01 DIAGNOSIS — I129 Hypertensive chronic kidney disease with stage 1 through stage 4 chronic kidney disease, or unspecified chronic kidney disease: Secondary | ICD-10-CM | POA: Diagnosis present

## 2012-12-01 DIAGNOSIS — D72829 Elevated white blood cell count, unspecified: Secondary | ICD-10-CM | POA: Diagnosis not present

## 2012-12-01 DIAGNOSIS — R310 Gross hematuria: Secondary | ICD-10-CM | POA: Diagnosis present

## 2012-12-01 DIAGNOSIS — N179 Acute kidney failure, unspecified: Secondary | ICD-10-CM | POA: Diagnosis not present

## 2012-12-01 DIAGNOSIS — I4891 Unspecified atrial fibrillation: Secondary | ICD-10-CM | POA: Diagnosis present

## 2012-12-01 DIAGNOSIS — D62 Acute posthemorrhagic anemia: Secondary | ICD-10-CM | POA: Diagnosis not present

## 2012-12-01 DIAGNOSIS — I712 Thoracic aortic aneurysm, without rupture, unspecified: Secondary | ICD-10-CM | POA: Diagnosis present

## 2012-12-01 DIAGNOSIS — K219 Gastro-esophageal reflux disease without esophagitis: Secondary | ICD-10-CM | POA: Diagnosis present

## 2012-12-01 DIAGNOSIS — I714 Abdominal aortic aneurysm, without rupture, unspecified: Secondary | ICD-10-CM | POA: Diagnosis present

## 2012-12-01 DIAGNOSIS — E039 Hypothyroidism, unspecified: Secondary | ICD-10-CM | POA: Diagnosis present

## 2012-12-01 DIAGNOSIS — Z9049 Acquired absence of other specified parts of digestive tract: Secondary | ICD-10-CM

## 2012-12-01 DIAGNOSIS — E875 Hyperkalemia: Secondary | ICD-10-CM | POA: Diagnosis not present

## 2012-12-01 DIAGNOSIS — I214 Non-ST elevation (NSTEMI) myocardial infarction: Secondary | ICD-10-CM | POA: Diagnosis not present

## 2012-12-01 DIAGNOSIS — T8119XA Other postprocedural shock, initial encounter: Secondary | ICD-10-CM | POA: Diagnosis not present

## 2012-12-01 DIAGNOSIS — N3289 Other specified disorders of bladder: Secondary | ICD-10-CM | POA: Diagnosis not present

## 2012-12-01 DIAGNOSIS — I739 Peripheral vascular disease, unspecified: Secondary | ICD-10-CM | POA: Diagnosis present

## 2012-12-01 DIAGNOSIS — R339 Retention of urine, unspecified: Secondary | ICD-10-CM | POA: Diagnosis present

## 2012-12-01 DIAGNOSIS — N138 Other obstructive and reflux uropathy: Principal | ICD-10-CM | POA: Diagnosis present

## 2012-12-01 DIAGNOSIS — Z951 Presence of aortocoronary bypass graft: Secondary | ICD-10-CM

## 2012-12-01 DIAGNOSIS — I2589 Other forms of chronic ischemic heart disease: Secondary | ICD-10-CM | POA: Diagnosis present

## 2012-12-01 DIAGNOSIS — K5900 Constipation, unspecified: Secondary | ICD-10-CM | POA: Diagnosis not present

## 2012-12-01 HISTORY — DX: Unspecified osteoarthritis, unspecified site: M19.90

## 2012-12-01 HISTORY — DX: Shortness of breath: R06.02

## 2012-12-01 HISTORY — DX: Hypothyroidism, unspecified: E03.9

## 2012-12-01 HISTORY — DX: Presence of automatic (implantable) cardiac defibrillator: Z95.810

## 2012-12-01 LAB — BASIC METABOLIC PANEL
BUN: 22 mg/dL (ref 6–23)
Calcium: 9 mg/dL (ref 8.4–10.5)
GFR calc non Af Amer: 43 mL/min — ABNORMAL LOW (ref >90)
Glucose, Bld: 108 mg/dL — ABNORMAL HIGH (ref 70–99)

## 2012-12-01 LAB — CBC
HCT: 36.5 % — ABNORMAL LOW (ref 39.0–52.0)
Hemoglobin: 11.1 g/dL — ABNORMAL LOW (ref 13.0–17.0)
MCH: 27.3 pg (ref 26.0–34.0)
MCHC: 30.4 g/dL (ref 30.0–36.0)

## 2012-12-01 NOTE — Progress Notes (Signed)
Patient performs some self caths prn per wife.

## 2012-12-01 NOTE — Progress Notes (Signed)
ECHO 10/01/12- EPIC  DR Excell Seltzer LOV note 09/08/12- See in note where ICD is turned off.   DR [name] -LOV note 11/23/12 EPIC

## 2012-12-01 NOTE — Progress Notes (Signed)
Per patient and wife patient has a abscessed root of tooth that was discovered on 11/27/12.  Dr Mena Goes office ( [facility]) was made aware of on 12/01/12 per wife.

## 2012-12-01 NOTE — Progress Notes (Signed)
PT,PTT and BMP results faxed via EPIC to Dr Mena Goes.

## 2012-12-03 ENCOUNTER — Other Ambulatory Visit: Payer: Self-pay | Admitting: Urology

## 2012-12-03 NOTE — H&P (Signed)
H&P   History of Present Illness: Roberto [name] has a history of BPH, urinary retention, gross hematuria believed to be from prostate source. He had primarily large median lobe on cystoscopy and elects to proceed with transurethral resection of the prostate. He's been well without fevers or dysuria.  He is at moderately increased cardiovascular risk and he understands this. He was cleared for surgery by Dr. Excell Seltzer. He also saw his pulmonologist. He has COPD.  He has low risk PCa under surveillance.     Past Medical History  Diagnosis Date  . AAA (abdominal aortic aneurysm)     a. CTA 09/2011: 4.1-4.2cm infrarenal AAA.  Marland Kitchen Aneurysm of thoracic aorta 1995    a. Rupture 1995 w/spontaneous resolution;  b. CTA 09/2011: 6.8cm dist, 5.7cm mid  . Popliteal artery aneurysm   . Myocardial infarction   . Ischemic cardiomyopathy     10/2011 ECHO: EF 30-35%, apical akinesis  . Paroxysmal atrial fibrillation   . Hypertension   . Peripheral vascular disease   . Dyslipidemia   . Diabetes mellitus 2007    dx'd 2007 w/persisting FBS>125  . GERD (gastroesophageal reflux disease)   . Hiatal hernia   . Diverticular disease   . BPH (benign prostatic hypertrophy)     with hx of acute urinary retention (pt can self-cath)  . COPD (chronic obstructive pulmonary disease)     05/09/08 PFT FEV1 1.84 (55%), FVC 4.42 (95%), TLC 6.46 (92%), DLCO 72%, no BD response.  Overnight oximetry 10/2011 normal on RA.  Marland Kitchen Pneumonia 1/09  . Allergic rhinitis   . Shingles     X 2 episodes, both in left scapula area  . OSA (obstructive sleep apnea) 06/14/09    PSG RDI 17, PLMI 96, intolerant of CPAP or BPAP  . Chronic renal insufficiency     Baseline Cr 1.5  . Prostate cancer 07/2008    Low grade; watchful waiting with [name]  . Pulmonary nodule, left 03/02/2010    Left base; resolved on f/u CT  . Allergic rhinitis   . Carpal tunnel syndrome of left wrist 02/11/2011  . IMPLANTATION OF DEFIBRILLATOR, HX OF 09/02/2008     Medtronic 7232Cx Maximo V. Had a 6949 lead - failed and therapies turned off.  . IRON DEFICIENCY 06/08/2010    Hemoccult negative:  GI ([name]) recommended no invasive diagnostics--watchful waiting (2012).  . UNSPECIFIED ANEMIA 06/01/2010    Multifactorial: iron def, CRI, vit B12 def,   . Hypotension 04/14/2012  . CHF (congestive heart failure)   . Atrial fibrillation   . CAD (coronary artery disease)     post CABG with prior percutaneous intervention of the saphenous vein graft with known saphenous vein graft disease.  . Automatic implantable cardioverter-defibrillator in situ     turned off per office visit note of Dr Excell Seltzer   . Hypothyroidism   . Shortness of breath   . Arthritis    Past Surgical History  Procedure Laterality Date  . Cardiac defibrillator placement  08/28/2004    [name]  . Colon surgery  1995    partial colectomy  . Coronary artery bypass graft  1996  . Aorta - bilateral femoral artery bypass graft  1999    [name] Early  . Nose surgery    . Insertion of pacing lead      New rate sensing pacing lead with removal of a previous implanted ICD and insertion of a device back in the pocket with defibrillation threshold  testing  . Abdominal aortic aneurysm repair  1999    Ingrarenal repair/ graft  . Stents      Placed to the saphenous vein graft  . Radiofrequency ablation  2006    A flutter  . Esophagogastroduodenoscopy (egd) with esophageal dilation  05/28/2012    Procedure: ESOPHAGOGASTRODUODENOSCOPY (EGD) WITH ESOPHAGEAL DILATION;  Surgeon: Rachael Fee, MD;  Location: [facility];  Service: Endoscopy;  Laterality: N/A;  . Coronary angioplasty    . Coronary stents       Home Medications:  No prescriptions prior to admission   Allergies:  Allergies  Allergen Reactions  . Atorvastatin     REACTION: muscle ache in legs  . Crestor (Rosuvastatin Calcium) Other (See Comments)    Lower ext fatigue and soreness- if taken everyday  .  Lisinopril Diarrhea  . Morphine     REACTION: AMS - agitation  . Prednisone     REACTION: Can't breath  . Trazodone And Nefazodone Other (See Comments)    "drunk"  . Zolpidem Tartrate     REACTION: AMS    Family History  Problem Relation Age of Onset  . Stroke Mother 44  . Heart attack Mother     CVA  . Cancer Father 52    Lung  . Heart attack Father 101  . Lung cancer Father   . Coronary artery disease Other     2 of 5 siblings with CAD   Social History:  reports that he quit smoking about 9 years ago. His smoking use included Cigarettes. He has a 30 pack-year smoking history. He quit smokeless tobacco use about 9 years ago. His smokeless tobacco use included Chew. He reports that he drinks about 0.6 ounces of alcohol per week. He reports that he does not use illicit drugs.  ROS: A complete review of systems was performed.  All systems are negative except for pertinent findings as noted. @ROS @   Physical Exam:  Vital signs in last 24 hours:   Filed Vitals:   12/04/12 0540  BP: 126/74  Pulse: 75  Temp: 97.9 F (36.6 C)  Resp: 16    General:  Alert and oriented, No acute distress HEENT: Normocephalic, atraumatic Neck: No JVD or lymphadenopathy Cardiovascular: Regular rate and rhythm Lungs: Regular rate and effort Abdomen: Soft, nontender, nondistended, no abdominal masses Back: No CVA tenderness Extremities: No edema Neurologic: Grossly intact  Laboratory Data:  No results found for this or any previous visit (from the past 24 hour(s)). No results found for this or any previous visit (from the past 240 hour(s)). Creatinine:  Recent Labs  12/01/12 1540  CREATININE 1.53*  CBC, PT/INR stable  Impression/Assessment:  Prostate Ca BPH Urinary retention Gross hematuria  Plan:  I discussed with the patient the nature, potential benefits, risks and alternatives to TURP/TUVP, including side effects of the proposed treatment, the likelihood of the patient  achieving the goals of the procedure, and any potential problems that might occur during the procedure or recuperation. We discussed this is not a tx for PCa. We discussed risks of incontinence and stricture among others. We discussed poss staged procedure. All questions answered. Patient elects to proceed.    Antony Haste

## 2012-12-04 ENCOUNTER — Encounter (HOSPITAL_COMMUNITY): Payer: Self-pay | Admitting: Anesthesiology

## 2012-12-04 ENCOUNTER — Encounter (HOSPITAL_COMMUNITY): Payer: Self-pay | Admitting: *Deleted

## 2012-12-04 ENCOUNTER — Inpatient Hospital Stay (HOSPITAL_COMMUNITY)
Admission: RE | Admit: 2012-12-04 | Discharge: 2012-12-10 | DRG: 713 | Disposition: A | Discharge status: Home / Self Care | Payer: Medicare Other | Source: Ambulatory Visit | Attending: Internal Medicine | Admitting: Internal Medicine

## 2012-12-04 ENCOUNTER — Encounter (HOSPITAL_COMMUNITY)
Admission: RE | Disposition: A | Discharge status: Home / Self Care | Payer: Self-pay | Source: Ambulatory Visit | Attending: Internal Medicine

## 2012-12-04 ENCOUNTER — Observation Stay (HOSPITAL_COMMUNITY): Payer: Medicare Other

## 2012-12-04 ENCOUNTER — Ambulatory Visit (HOSPITAL_COMMUNITY): Payer: Medicare Other | Admitting: Anesthesiology

## 2012-12-04 DIAGNOSIS — I4891 Unspecified atrial fibrillation: Secondary | ICD-10-CM

## 2012-12-04 DIAGNOSIS — N182 Chronic kidney disease, stage 2 (mild): Secondary | ICD-10-CM

## 2012-12-04 DIAGNOSIS — I2589 Other forms of chronic ischemic heart disease: Secondary | ICD-10-CM

## 2012-12-04 DIAGNOSIS — I712 Thoracic aortic aneurysm, without rupture, unspecified: Secondary | ICD-10-CM

## 2012-12-04 DIAGNOSIS — Z7901 Long term (current) use of anticoagulants: Secondary | ICD-10-CM

## 2012-12-04 DIAGNOSIS — J9620 Acute and chronic respiratory failure, unspecified whether with hypoxia or hypercapnia: Secondary | ICD-10-CM

## 2012-12-04 DIAGNOSIS — I5021 Acute systolic (congestive) heart failure: Secondary | ICD-10-CM

## 2012-12-04 DIAGNOSIS — I214 Non-ST elevation (NSTEMI) myocardial infarction: Secondary | ICD-10-CM

## 2012-12-04 DIAGNOSIS — J449 Chronic obstructive pulmonary disease, unspecified: Secondary | ICD-10-CM | POA: Diagnosis present

## 2012-12-04 DIAGNOSIS — E875 Hyperkalemia: Secondary | ICD-10-CM | POA: Diagnosis present

## 2012-12-04 DIAGNOSIS — E872 Acidosis, unspecified: Secondary | ICD-10-CM

## 2012-12-04 DIAGNOSIS — E039 Hypothyroidism, unspecified: Secondary | ICD-10-CM

## 2012-12-04 DIAGNOSIS — I714 Abdominal aortic aneurysm, without rupture, unspecified: Secondary | ICD-10-CM

## 2012-12-04 DIAGNOSIS — Z9889 Other specified postprocedural states: Secondary | ICD-10-CM

## 2012-12-04 DIAGNOSIS — D510 Vitamin B12 deficiency anemia due to intrinsic factor deficiency: Secondary | ICD-10-CM

## 2012-12-04 DIAGNOSIS — I10 Essential (primary) hypertension: Secondary | ICD-10-CM

## 2012-12-04 DIAGNOSIS — N189 Chronic kidney disease, unspecified: Secondary | ICD-10-CM

## 2012-12-04 DIAGNOSIS — I5022 Chronic systolic (congestive) heart failure: Secondary | ICD-10-CM

## 2012-12-04 DIAGNOSIS — R7401 Elevation of levels of liver transaminase levels: Secondary | ICD-10-CM

## 2012-12-04 DIAGNOSIS — I249 Acute ischemic heart disease, unspecified: Secondary | ICD-10-CM

## 2012-12-04 DIAGNOSIS — Z8679 Personal history of other diseases of the circulatory system: Secondary | ICD-10-CM

## 2012-12-04 DIAGNOSIS — C61 Malignant neoplasm of prostate: Secondary | ICD-10-CM | POA: Diagnosis present

## 2012-12-04 DIAGNOSIS — J4489 Other specified chronic obstructive pulmonary disease: Secondary | ICD-10-CM

## 2012-12-04 DIAGNOSIS — I959 Hypotension, unspecified: Secondary | ICD-10-CM | POA: Diagnosis present

## 2012-12-04 DIAGNOSIS — T68XXXA Hypothermia, initial encounter: Secondary | ICD-10-CM

## 2012-12-04 DIAGNOSIS — I5020 Unspecified systolic (congestive) heart failure: Secondary | ICD-10-CM | POA: Diagnosis present

## 2012-12-04 DIAGNOSIS — D62 Acute posthemorrhagic anemia: Secondary | ICD-10-CM

## 2012-12-04 DIAGNOSIS — I739 Peripheral vascular disease, unspecified: Secondary | ICD-10-CM

## 2012-12-04 DIAGNOSIS — E119 Type 2 diabetes mellitus without complications: Secondary | ICD-10-CM

## 2012-12-04 DIAGNOSIS — E785 Hyperlipidemia, unspecified: Secondary | ICD-10-CM

## 2012-12-04 DIAGNOSIS — N179 Acute kidney failure, unspecified: Secondary | ICD-10-CM | POA: Diagnosis present

## 2012-12-04 HISTORY — PX: TRANSURETHRAL RESECTION OF PROSTATE: SHX73

## 2012-12-04 LAB — BLOOD GAS, ARTERIAL
Acid-base deficit: 4.8 mmol/L — ABNORMAL HIGH (ref 0.0–2.0)
Bicarbonate: 18.3 mEq/L — ABNORMAL LOW (ref 20.0–24.0)
O2 Saturation: 98.6 %
Patient temperature: 98.6
TCO2: 17.4 mmol/L (ref 0–100)

## 2012-12-04 LAB — BASIC METABOLIC PANEL
BUN: 25 mg/dL — ABNORMAL HIGH (ref 6–23)
CO2: 12 mEq/L — ABNORMAL LOW (ref 19–32)
Calcium: 8.6 mg/dL (ref 8.4–10.5)
Chloride: 106 mEq/L (ref 96–112)
Chloride: 104 mEq/L (ref 96–112)
Creatinine, Ser: 1.59 mg/dL — ABNORMAL HIGH (ref 0.50–1.35)
Creatinine, Ser: 1.87 mg/dL — ABNORMAL HIGH (ref 0.50–1.35)
GFR calc non Af Amer: 41 mL/min — ABNORMAL LOW (ref >90)
Potassium: 5.3 mEq/L — ABNORMAL HIGH (ref 3.5–5.1)
Sodium: 138 mEq/L (ref 135–145)
Sodium: 136 mEq/L (ref 135–145)

## 2012-12-04 LAB — CBC
HCT: 29.2 % — ABNORMAL LOW (ref 39.0–52.0)
MCH: 27.8 pg (ref 26.0–34.0)
MCV: 90.5 fL (ref 78.0–100.0)
MCV: 92.1 fL (ref 78.0–100.0)
Platelets: 182 10*3/uL (ref 150–400)
RBC: 3.17 MIL/uL — ABNORMAL LOW (ref 4.22–5.81)
RDW: 15.4 % (ref 11.5–15.5)
WBC: 16 10*3/uL — ABNORMAL HIGH (ref 4.0–10.5)

## 2012-12-04 LAB — GLUCOSE, CAPILLARY
Glucose-Capillary: 116 mg/dL — ABNORMAL HIGH (ref 70–99)
Glucose-Capillary: 123 mg/dL — ABNORMAL HIGH (ref 70–99)

## 2012-12-04 LAB — LACTIC ACID, PLASMA: Lactic Acid, Venous: 5.6 mmol/L — ABNORMAL HIGH (ref 0.5–2.2)

## 2012-12-04 LAB — HEMOGLOBIN AND HEMATOCRIT, BLOOD
HCT: 28.8 % — ABNORMAL LOW (ref 39.0–52.0)
Hemoglobin: 8.8 g/dL — ABNORMAL LOW (ref 13.0–17.0)

## 2012-12-04 SURGERY — TRANSURETHRAL RESECTION OF THE PROSTATE WITH GYRUS INSTRUMENTS
Anesthesia: Spinal | Wound class: Clean Contaminated

## 2012-12-04 MED ORDER — ASPIRIN EC 81 MG PO TBEC: 81.0000 mg | DELAYED_RELEASE_TABLET | Freq: Every morning | ORAL | Status: DC

## 2012-12-04 MED ORDER — DOCUSATE SODIUM 100 MG PO CAPS
100.0000 mg | ORAL_CAPSULE | Freq: Two times a day (BID) | ORAL | Status: DC
  Administered 2012-12-04: 100 mg via ORAL
  Filled 2012-12-04 (×3): qty 1

## 2012-12-04 MED ORDER — FENTANYL CITRATE 0.05 MG/ML IJ SOLN
INTRAMUSCULAR | Status: DC | PRN
  Administered 2012-12-04 (×2): 25 ug via INTRAVENOUS

## 2012-12-04 MED ORDER — PANTOPRAZOLE SODIUM 40 MG PO TBEC
40.0000 mg | DELAYED_RELEASE_TABLET | Freq: Every day | ORAL | Status: DC
  Administered 2012-12-05 – 2012-12-10 (×6): 40 mg via ORAL
  Filled 2012-12-04 (×7): qty 1

## 2012-12-04 MED ORDER — PROPOFOL INFUSION 10 MG/ML OPTIME
INTRAVENOUS | Status: DC | PRN
  Administered 2012-12-04: 25 ug/kg/min via INTRAVENOUS

## 2012-12-04 MED ORDER — ACETAMINOPHEN 325 MG PO TABS
650.0000 mg | ORAL_TABLET | ORAL | Status: DC | PRN
  Administered 2012-12-04 – 2012-12-10 (×9): 650 mg via ORAL
  Filled 2012-12-04 (×9): qty 2

## 2012-12-04 MED ORDER — FINASTERIDE 5 MG PO TABS
5.0000 mg | ORAL_TABLET | Freq: Every day | ORAL | Status: DC
  Administered 2012-12-04 – 2012-12-09 (×6): 5 mg via ORAL
  Filled 2012-12-04 (×8): qty 1

## 2012-12-04 MED ORDER — BUDESONIDE 0.25 MG/2ML IN SUSP
0.2500 mg | Freq: Two times a day (BID) | RESPIRATORY_TRACT | Status: DC
  Administered 2012-12-04 – 2012-12-09 (×11): 0.25 mg via RESPIRATORY_TRACT
  Filled 2012-12-04 (×13): qty 2

## 2012-12-04 MED ORDER — PROPOFOL 10 MG/ML IV BOLUS
INTRAVENOUS | Status: DC | PRN
  Administered 2012-12-04: 20 mg via INTRAVENOUS

## 2012-12-04 MED ORDER — HYDROMORPHONE HCL PF 1 MG/ML IJ SOLN
0.5000 mg | INTRAMUSCULAR | Status: DC | PRN
  Administered 2012-12-05: 0.5 mg via INTRAVENOUS
  Filled 2012-12-04: qty 1

## 2012-12-04 MED ORDER — CEFAZOLIN SODIUM-DEXTROSE 2-3 GM-% IV SOLR: 2.0000 g | INTRAVENOUS | Status: DC

## 2012-12-04 MED ORDER — POTASSIUM CHLORIDE CRYS ER 10 MEQ PO TBCR: 10.0000 meq | EXTENDED_RELEASE_TABLET | Freq: Every day | ORAL | Status: DC

## 2012-12-04 MED ORDER — SODIUM CHLORIDE 0.9 % IV SOLN
INTRAVENOUS | Status: DC
  Administered 2012-12-04: 75 mL/h via INTRAVENOUS

## 2012-12-04 MED ORDER — LINAGLIPTIN 5 MG PO TABS
5.0000 mg | ORAL_TABLET | Freq: Every day | ORAL | Status: DC
  Administered 2012-12-05 – 2012-12-10 (×6): 5 mg via ORAL
  Filled 2012-12-04 (×7): qty 1

## 2012-12-04 MED ORDER — HYOSCYAMINE SULFATE 0.125 MG SL SUBL
0.1250 mg | SUBLINGUAL_TABLET | SUBLINGUAL | Status: DC | PRN
  Administered 2012-12-04 – 2012-12-08 (×3): 0.125 mg via ORAL
  Filled 2012-12-04 (×4): qty 1

## 2012-12-04 MED ORDER — AMIODARONE HCL 200 MG PO TABS
200.0000 mg | ORAL_TABLET | Freq: Every morning | ORAL | Status: DC
  Administered 2012-12-05 – 2012-12-10 (×6): 200 mg via ORAL
  Filled 2012-12-04 (×7): qty 1

## 2012-12-04 MED ORDER — CARVEDILOL 3.125 MG PO TABS
3.1250 mg | ORAL_TABLET | Freq: Two times a day (BID) | ORAL | Status: DC
  Filled 2012-12-04 (×2): qty 1

## 2012-12-04 MED ORDER — CIPROFLOXACIN IN D5W 400 MG/200ML IV SOLN
INTRAVENOUS | Status: DC | PRN
  Administered 2012-12-04: 400 mg via INTRAVENOUS

## 2012-12-04 MED ORDER — LACTATED RINGERS IV SOLN
INTRAVENOUS | Status: DC | PRN
  Administered 2012-12-04 (×2): via INTRAVENOUS

## 2012-12-04 MED ORDER — LACTATED RINGERS IV SOLN: INTRAVENOUS | Status: DC

## 2012-12-04 MED ORDER — TAMSULOSIN HCL 0.4 MG PO CAPS
0.8000 mg | ORAL_CAPSULE | Freq: Every morning | ORAL | Status: DC
  Administered 2012-12-05 – 2012-12-10 (×6): 0.8 mg via ORAL
  Filled 2012-12-04 (×6): qty 2

## 2012-12-04 MED ORDER — DIPHENHYDRAMINE HCL 12.5 MG/5ML PO ELIX: 12.5000 mg | ORAL_SOLUTION | Freq: Four times a day (QID) | ORAL | Status: DC | PRN

## 2012-12-04 MED ORDER — PROMETHAZINE HCL 25 MG/ML IJ SOLN: 6.2500 mg | INTRAMUSCULAR | Status: DC | PRN

## 2012-12-04 MED ORDER — TRAMADOL HCL 50 MG PO TABS
50.0000 mg | ORAL_TABLET | Freq: Four times a day (QID) | ORAL | Status: DC | PRN
  Administered 2012-12-04 – 2012-12-05 (×4): 50 mg via ORAL
  Filled 2012-12-04 (×4): qty 1

## 2012-12-04 MED ORDER — LEVOTHYROXINE SODIUM 125 MCG PO TABS
125.0000 ug | ORAL_TABLET | ORAL | Status: DC
  Administered 2012-12-07 – 2012-12-09 (×2): 125 ug via ORAL
  Filled 2012-12-04 (×4): qty 1

## 2012-12-04 MED ORDER — NITROGLYCERIN 0.3 MG SL SUBL
0.3000 mg | SUBLINGUAL_TABLET | SUBLINGUAL | Status: DC | PRN
  Administered 2012-12-05 – 2012-12-07 (×4): 0.3 mg via SUBLINGUAL
  Filled 2012-12-04: qty 100

## 2012-12-04 MED ORDER — HYOSCYAMINE SULFATE 0.125 MG SL SUBL: 0.1250 mg | SUBLINGUAL_TABLET | SUBLINGUAL | Status: AC | PRN

## 2012-12-04 MED ORDER — WARFARIN SODIUM 5 MG PO TABS: 2.5000 mg | ORAL_TABLET | Freq: Every day | ORAL | Status: DC

## 2012-12-04 MED ORDER — CEPHALEXIN 500 MG PO CAPS: 500.0000 mg | ORAL_CAPSULE | Freq: Every day | ORAL | Status: DC

## 2012-12-04 MED ORDER — ALBUTEROL SULFATE (5 MG/ML) 0.5% IN NEBU: 2.5000 mg | INHALATION_SOLUTION | Freq: Four times a day (QID) | RESPIRATORY_TRACT | Status: DC | PRN

## 2012-12-04 MED ORDER — FUROSEMIDE 40 MG PO TABS
40.0000 mg | ORAL_TABLET | Freq: Every morning | ORAL | Status: DC
  Filled 2012-12-04: qty 1

## 2012-12-04 MED ORDER — TIOTROPIUM BROMIDE MONOHYDRATE 18 MCG IN CAPS
18.0000 ug | ORAL_CAPSULE | Freq: Every day | RESPIRATORY_TRACT | Status: DC
  Filled 2012-12-04: qty 5

## 2012-12-04 MED ORDER — LORATADINE 10 MG PO TABS
10.0000 mg | ORAL_TABLET | Freq: Every day | ORAL | Status: DC
  Filled 2012-12-04: qty 1

## 2012-12-04 MED ORDER — ALBUTEROL SULFATE (5 MG/ML) 0.5% IN NEBU
2.5000 mg | INHALATION_SOLUTION | RESPIRATORY_TRACT | Status: DC
  Administered 2012-12-04 – 2012-12-05 (×4): 2.5 mg via RESPIRATORY_TRACT
  Filled 2012-12-04 (×3): qty 0.5

## 2012-12-04 MED ORDER — MIRTAZAPINE 15 MG PO TABS
15.0000 mg | ORAL_TABLET | Freq: Every day | ORAL | Status: DC
  Administered 2012-12-05 – 2012-12-09 (×5): 15 mg via ORAL
  Filled 2012-12-04 (×8): qty 1

## 2012-12-04 MED ORDER — ALBUTEROL SULFATE (5 MG/ML) 0.5% IN NEBU: 2.5000 mg | INHALATION_SOLUTION | RESPIRATORY_TRACT | Status: DC | PRN

## 2012-12-04 MED ORDER — INSULIN ASPART 100 UNIT/ML ~~LOC~~ SOLN
2.0000 [IU] | SUBCUTANEOUS | Status: DC
Start: 2012-12-04 — End: 2012-12-09
  Administered 2012-12-05: 4 [IU] via SUBCUTANEOUS
  Administered 2012-12-05: 2 [IU] via SUBCUTANEOUS
  Administered 2012-12-05: 4 [IU] via SUBCUTANEOUS
  Administered 2012-12-05: 2 [IU] via SUBCUTANEOUS
  Administered 2012-12-05 – 2012-12-06 (×3): 4 [IU] via SUBCUTANEOUS
  Administered 2012-12-06: 6 [IU] via SUBCUTANEOUS
  Administered 2012-12-06: 2 [IU] via SUBCUTANEOUS
  Administered 2012-12-06: 4 [IU] via SUBCUTANEOUS
  Administered 2012-12-06: 2 [IU] via SUBCUTANEOUS
  Administered 2012-12-06 – 2012-12-07 (×4): 4 [IU] via SUBCUTANEOUS
  Administered 2012-12-07 – 2012-12-08 (×3): 2 [IU] via SUBCUTANEOUS
  Administered 2012-12-08: 4 [IU] via SUBCUTANEOUS
  Administered 2012-12-08: 2 [IU] via SUBCUTANEOUS
  Administered 2012-12-08: 4 [IU] via SUBCUTANEOUS
  Administered 2012-12-09: 2 [IU] via SUBCUTANEOUS
  Administered 2012-12-09 (×3): 4 [IU] via SUBCUTANEOUS

## 2012-12-04 MED ORDER — CIPROFLOXACIN HCL 500 MG PO TABS
500.0000 mg | ORAL_TABLET | Freq: Two times a day (BID) | ORAL | Status: DC
  Administered 2012-12-04 – 2012-12-05 (×2): 500 mg via ORAL
  Filled 2012-12-04 (×4): qty 1

## 2012-12-04 MED ORDER — METHYLPREDNISOLONE SODIUM SUCC 40 MG IJ SOLR
40.0000 mg | Freq: Two times a day (BID) | INTRAMUSCULAR | Status: DC
  Administered 2012-12-05 – 2012-12-07 (×5): 40 mg via INTRAVENOUS
  Filled 2012-12-04 (×9): qty 1

## 2012-12-04 MED ORDER — FENTANYL CITRATE 0.05 MG/ML IJ SOLN: 25.0000 ug | INTRAMUSCULAR | Status: DC | PRN

## 2012-12-04 MED ORDER — TAMSULOSIN HCL 0.4 MG PO CAPS
0.8000 mg | ORAL_CAPSULE | Freq: Every morning | ORAL | Status: DC
  Filled 2012-12-04: qty 2

## 2012-12-04 MED ORDER — OMEPRAZOLE MAGNESIUM 20 MG PO TBEC: 20.0000 mg | DELAYED_RELEASE_TABLET | Freq: Every day | ORAL | Status: DC

## 2012-12-04 MED ORDER — EPHEDRINE SULFATE 50 MG/ML IJ SOLN
INTRAMUSCULAR | Status: DC | PRN
  Administered 2012-12-04 (×3): 5 mg via INTRAVENOUS

## 2012-12-04 MED ORDER — SODIUM POLYSTYRENE SULFONATE 15 GM/60ML PO SUSP
30.0000 g | Freq: Once | ORAL | Status: AC
  Administered 2012-12-04: 30 g via ORAL
  Filled 2012-12-04: qty 120

## 2012-12-04 MED ORDER — BUPIVACAINE IN DEXTROSE 0.75-8.25 % IT SOLN
INTRATHECAL | Status: DC | PRN
  Administered 2012-12-04: 2 mL via INTRATHECAL

## 2012-12-04 MED ORDER — BACITRACIN-NEOMYCIN-POLYMYXIN 400-5-5000 EX OINT: 1.0000 "application " | TOPICAL_OINTMENT | Freq: Three times a day (TID) | CUTANEOUS | Status: DC | PRN

## 2012-12-04 MED ORDER — LEVALBUTEROL TARTRATE 45 MCG/ACT IN AERO
1.0000 | INHALATION_SPRAY | RESPIRATORY_TRACT | Status: DC | PRN
  Filled 2012-12-04: qty 15

## 2012-12-04 MED ORDER — ISOSORBIDE MONONITRATE ER 60 MG PO TB24: 60.0000 mg | ORAL_TABLET | Freq: Every morning | ORAL | Status: DC

## 2012-12-04 MED ORDER — SODIUM CHLORIDE 0.9 % IR SOLN
3000.0000 mL | Status: DC
  Administered 2012-12-04 – 2012-12-06 (×18): 3000 mL

## 2012-12-04 MED ORDER — ARFORMOTEROL TARTRATE 15 MCG/2ML IN NEBU
15.0000 ug | INHALATION_SOLUTION | Freq: Two times a day (BID) | RESPIRATORY_TRACT | Status: DC
  Administered 2012-12-04: 15 ug via RESPIRATORY_TRACT
  Filled 2012-12-04 (×4): qty 2

## 2012-12-04 MED ORDER — LEVOTHYROXINE SODIUM 125 MCG PO TABS
62.5000 ug | ORAL_TABLET | ORAL | Status: DC
  Administered 2012-12-05 – 2012-12-10 (×4): 62.5 ug via ORAL
  Filled 2012-12-04 (×6): qty 0.5

## 2012-12-04 MED ORDER — DIPHENHYDRAMINE HCL 50 MG/ML IJ SOLN: 12.5000 mg | Freq: Four times a day (QID) | INTRAMUSCULAR | Status: DC | PRN

## 2012-12-04 MED ORDER — CYANOCOBALAMIN 1000 MCG/ML IJ SOLN
1000.0000 ug | INTRAMUSCULAR | Status: DC
Start: 2012-12-11 — End: 2012-12-10

## 2012-12-04 MED ORDER — ONDANSETRON HCL 4 MG/2ML IJ SOLN
4.0000 mg | INTRAMUSCULAR | Status: DC | PRN
  Administered 2012-12-05: 4 mg via INTRAVENOUS
  Filled 2012-12-04: qty 2

## 2012-12-04 SURGICAL SUPPLY — 23 items
BAG URINE DRAINAGE (UROLOGICAL SUPPLIES) ×2 IMPLANT
BAG URO CATCHER STRL LF (DRAPE) ×2 IMPLANT
CATH FOLEY 3WAY 30CC 22FR (CATHETERS) ×2 IMPLANT
CATH FOLEY 3WAY 30CC 24FR (CATHETERS) ×1
CATH URTH STD 24FR FL 3W 2 (CATHETERS) ×1 IMPLANT
CLOTH BEACON ORANGE TIMEOUT ST (SAFETY) ×2 IMPLANT
DRAPE CAMERA CLOSED 9X96 (DRAPES) ×2 IMPLANT
ELECT BUTTON HF 24-28F 2 30DE (ELECTRODE) ×4 IMPLANT
ELECT LOOP MED HF 24F 12D (CUTTING LOOP) ×2 IMPLANT
ELECT LOOP MED HF 24F 12D CBL (CLIP) IMPLANT
ELECT RESECT VAPORIZE 12D CBL (ELECTRODE) IMPLANT
EVACUATOR MICROVAS BLADDER (UROLOGICAL SUPPLIES) ×2 IMPLANT
GLOVE BIOGEL M STRL SZ7.5 (GLOVE) ×2 IMPLANT
GOWN STRL REIN XL XLG (GOWN DISPOSABLE) ×2 IMPLANT
HOLDER FOLEY CATH W/STRAP (MISCELLANEOUS) IMPLANT
IV NS IRRIG 3000ML ARTHROMATIC (IV SOLUTION) ×16 IMPLANT
MANIFOLD NEPTUNE II (INSTRUMENTS) ×2 IMPLANT
PACK CYSTO (CUSTOM PROCEDURE TRAY) ×2 IMPLANT
PLUG CATH AND CAP STER (CATHETERS) ×2 IMPLANT
SYR 30ML LL (SYRINGE) IMPLANT
SYRINGE IRR TOOMEY STRL 70CC (SYRINGE) ×2 IMPLANT
TUBING CONNECTING 10 (TUBING) ×2 IMPLANT
WATER STERILE IRR 500ML POUR (IV SOLUTION) ×2 IMPLANT

## 2012-12-04 NOTE — Op Note (Signed)
Preop diagnosis:  BPH Urinary retention Gross hematuria Prostate cancer  Postop diagnosis: Same  Procedure: Exam under anesthesia Transurethral resection and vaporization of the prostate  Surgeon: Mena Goes Anesthesia: Fortune  Type of anesthesia: Spinal  Findings: On exam under anesthesia the prostate exhibited BPH but was smooth without hard area or nodule. There was no evidence of prostate cancer or extracapsular extension.  On cystoscopy there was trilobar hypertrophy with a large obstructing median lobe.  Description of procedure: After consent was obtained patient brought to the operating room. After adequate anesthesia he was placed in lithotomy position and prepped and draped in the usual sterile fashion. A timeout was performed to confirm the patient and procedure. An exam under anesthesia was performed. The resectoscope sheath was passed with the visual obturator. The ureteral orifice these were identified. The bladder had no obvious tumors or stones. Photos were taken but the equipment malfunctioned and these were not able to be retrieved. Resection commenced at 7:00 and a created a groove between the median lobe and the lateral lobe  on the patient's right-hand side  down to the bladder neck and prostatic capsule. This was carried down to the veru. Similar channel was created 5:00. This groove was carried down from the bladder neck to the veru. Using this depth as a guide the median lobe was resected. Next the proximal half of the left lateral lobe was resected from the bladder neck to the mid prostate top to bottom meeting down with the median lobe resection. And in the distal half of the lateral lobe from top to bottom. Similarly the right lateral lobe was resected from top to bottom on the proximal half and in the distal half. No resection was carried distal to the veru. All the chips were evacuated. The ureteral orifices were identified and noted to be without injury. I used the  button to fulgurate the resected tissue and also vaporize some residual lateral lobe tissue. Hemostasis was adequate. The scope was removed and a 24 [place] three-way catheter was placed with catheter guide. The balloon was inflated with 30 cc and seated at the bladder neck. The catheter was then placed gravity drainage and the urine was light red to light pink.   EBL: Minimal  Complications: None   Drains: 24 French three-way Foley   specimens: TURP chips   Disposition: Patient stable to PACU.

## 2012-12-04 NOTE — Consult Note (Signed)
[facility] Medical Consultation  ABDUL BEIRNE ZOX:[id number] DOB: 1936/01/10 DOA: 12/04/2012 PCP: Jeoffrey Massed, MD   Requesting physician: Dr. Mena Goes  Date of consultation: 12/04/2012 Reason for consultation: Hg drop 11 (12/01/2012) --> 8.8 (12/04/2012)  Impression/Recommendations  Principal Problem:   Acute and chronic respiratory failure - unclear etiology at this time, wheezing on exam present - possibly related to COPD - will transfer to SDU, obtain stat ABG, 12 lead EKG, CXR - continue with NRB for now, add albuterol nebulizer prn Q2 hours and scheduled  - will alert PCCM of pt's transfer to SDU Active Problems:   HYPERTENSION - BP currently on the soft side - will hold Isosorbide and Coreg - continue Lasix but may need to hold if further drop in BP    COPD - with wheezing on exam, management with nebulizer as needed and scheduled - add Solumedrol as well, NRB if needed - follow upon CXR   ISCHEMIC CARDIOMYOPATHY - last 2 D ECHO 30 -35% in May 2014, no signs of volume overload on exam - continue Lasix for now - daily weights, strict I's and O's   Atrial fibrillation - rate controlled - continue Amiodarone, will continue this but will hold Coumadin due to acute blood loss anemia     CKD (chronic kidney disease) stage 2, GFR 60-89 ml/min - creatinine at baseline - BMP in AM   ABDOMINAL AORTIC ANEURYSM   PROSTATE CANCER  I will followup again tomorrow. Due to complexity of medical conditions, will transfer service to [facility], urology team will continue to consult. I have discussed this with Dr. Mena Goes.   Chief Complaint: Post op Hg drop  HPI:  Pt is 77 yo male with complicated and complex medical conditions including ischemic cardiomyopathy with EF 30 - 35% based on last 2 D ECHO 09/2012 (on Lasix at home), PAF on Aspirin, Coumadin, Amiodarone, COPD, CKD stage II - III with baseline Cr ~2 (per last BMP in 09/2012), BPH and urinary retention, CAD and s/p  CABG, who has presented to [facility] for TUPR. He has tolerated procedure well earlier today 07/25 and after several hours upon arrival to the floor has suddenly decompensated with acute onset of dyspnea at rest, oxygen desaturation to low 80's, SBP in 80's. He was placed on NRB and has responded well. He explains he feels well at this time, denies chest pain or shortness of breath, no specific abdominal or urinary concerns, he denies cough, no fevers, chill, no LE swelling, no specific focal neurological symptoms.   Due to acute onset of hypoxic respiratory failure, pt required transfer to SDU, Dr. Mena Goes notified, PCCM notified as well.   Review of Systems:  Constitutional: Negative for fever, chills, diaphoresis, activity change, appetite change and fatigue.  HENT: Negative for ear pain, nosebleeds, congestion, facial swelling, rhinorrhea, neck pain, neck stiffness and ear discharge.   Eyes: Negative for pain, discharge, redness, itching and visual disturbance.  Respiratory: per HPI Cardiovascular: Negative for chest pain, palpitations and leg swelling.  Gastrointestinal: Negative for abdominal distention.  Genitourinary: Negative for flank pain, has chronic hematuria and urinary retention  Musculoskeletal: Negative for back pain, joint swelling, arthralgias and gait problem.  Neurological: Negative for dizziness, tremors, seizures, syncope, facial asymmetry, speech difficulty, weakness, light-headedness, numbness and headaches.  Hematological: Negative for adenopathy. Does not bruise/bleed easily.  Psychiatric/Behavioral: Negative for hallucinations, behavioral problems, confusion, dysphoric mood, decreased concentration and agitation.    Past Medical History  Diagnosis Date  . AAA (abdominal aortic aneurysm)  a. CTA 09/2011: 4.1-4.2cm infrarenal AAA.  Marland Kitchen Aneurysm of thoracic aorta 1995    a. Rupture 1995 w/spontaneous resolution;  b. CTA 09/2011: 6.8cm dist, 5.7cm mid  . Popliteal artery  aneurysm   . Myocardial infarction   . Ischemic cardiomyopathy     10/2011 ECHO: EF 30-35%, apical akinesis  . Paroxysmal atrial fibrillation   . Hypertension   . Peripheral vascular disease   . Dyslipidemia   . Diabetes mellitus 2007    dx'd 2007 w/persisting FBS>125  . GERD (gastroesophageal reflux disease)   . Hiatal hernia   . Diverticular disease   . BPH (benign prostatic hypertrophy)     with hx of acute urinary retention (pt can self-cath)  . COPD (chronic obstructive pulmonary disease)     05/09/08 PFT FEV1 1.84 (55%), FVC 4.42 (95%), TLC 6.46 (92%), DLCO 72%, no BD response.  Overnight oximetry 10/2011 normal on RA.  Marland Kitchen Pneumonia 1/09  . Allergic rhinitis   . Shingles     X 2 episodes, both in left scapula area  . OSA (obstructive sleep apnea) 06/14/09    PSG RDI 17, PLMI 96, intolerant of CPAP or BPAP  . Chronic renal insufficiency     Baseline Cr 1.5  . Prostate cancer 07/2008    Low grade; watchful waiting with [name]  . Pulmonary nodule, left 03/02/2010    Left base; resolved on f/u CT  . Allergic rhinitis   . Carpal tunnel syndrome of left wrist 02/11/2011  . IMPLANTATION OF DEFIBRILLATOR, HX OF 09/02/2008    Medtronic 7232Cx Maximo V. Had a 6949 lead - failed and therapies turned off.  . IRON DEFICIENCY 06/08/2010    Hemoccult negative:  GI ([name]) recommended no invasive diagnostics--watchful waiting (2012).  . UNSPECIFIED ANEMIA 06/01/2010    Multifactorial: iron def, CRI, vit B12 def,   . Hypotension 04/14/2012  . CHF (congestive heart failure)   . Atrial fibrillation   . CAD (coronary artery disease)     post CABG with prior percutaneous intervention of the saphenous vein graft with known saphenous vein graft disease.  . Automatic implantable cardioverter-defibrillator in situ     turned off per office visit note of Dr Excell Seltzer   . Hypothyroidism   . Shortness of breath   . Arthritis    Past Surgical History  Procedure Laterality Date  . Cardiac  defibrillator placement  08/28/2004    [name]  . Colon surgery  1995    partial colectomy  . Coronary artery bypass graft  1996  . Aorta - bilateral femoral artery bypass graft  1999    [name] Early  . Nose surgery    . Insertion of pacing lead      New rate sensing pacing lead with removal of a previous implanted ICD and insertion of a device back in the pocket with defibrillation threshold testing  . Abdominal aortic aneurysm repair  1999    Ingrarenal repair/ graft  . Stents      Placed to the saphenous vein graft  . Radiofrequency ablation  2006    A flutter  . Esophagogastroduodenoscopy (egd) with esophageal dilation  05/28/2012    Procedure: ESOPHAGOGASTRODUODENOSCOPY (EGD) WITH ESOPHAGEAL DILATION;  Surgeon: Rachael Fee, MD;  Location: [facility];  Service: Endoscopy;  Laterality: N/A;  . Coronary angioplasty    . Coronary stents      Social History:  reports that he quit smoking about 9 years ago. His smoking use  included Cigarettes. He has a 30 pack-year smoking history. He quit smokeless tobacco use about 9 years ago. His smokeless tobacco use included Chew. He reports that he drinks about 0.6 ounces of alcohol per week. He reports that he does not use illicit drugs.  Allergies  Allergen Reactions  . Atorvastatin     REACTION: muscle ache in legs  . Crestor (Rosuvastatin Calcium) Other (See Comments)    Lower ext fatigue and soreness- if taken everyday  . Lisinopril Diarrhea  . Morphine     REACTION: AMS - agitation  . Prednisone     REACTION: Can't breath  . Trazodone And Nefazodone Other (See Comments)    "drunk"  . Zolpidem Tartrate     REACTION: AMS   Family History  Problem Relation Age of Onset  . Stroke Mother 33  . Heart attack Mother     CVA  . Cancer Father 26    Lung  . Heart attack Father 25  . Lung cancer Father   . Coronary artery disease Other     2 of 5 siblings with CAD    Prior to Admission medications   Medication Sig  Start Date End Date Taking? Authorizing Provider  acetaminophen (TYLENOL) 500 MG tablet Take 1,000 mg by mouth every 6 (six) hours as needed. For headache, pain or fever.   Yes Historical Provider, MD  albuterol (PROVENTIL) (2.5 MG/3ML) 0.083% nebulizer solution Take 3 mLs (2.5 mg total) by nebulization 4 (four) times daily as needed. Dx 496 09/29/12  Yes Coralyn Helling, MD  amiodarone (PACERONE) 200 MG tablet Take 1 tablet (200 mg total) by mouth every morning. 10/09/12  Yes Herby Abraham, MD  aspirin EC 81 MG tablet Take 81 mg by mouth every morning.   Yes Historical Provider, MD  budesonide (PULMICORT) 0.25 MG/2ML nebulizer solution Take 2 mLs (0.25 mg total) by nebulization 2 (two) times daily. 09/29/12  Yes Coralyn Helling, MD  carvedilol (COREG) 3.125 MG tablet Take 3.125 mg by mouth 2 (two) times daily with a meal.   Yes Historical Provider, MD  cetirizine (ZYRTEC) 10 MG tablet Take 10 mg by mouth every morning.    Yes Historical Provider, MD  formoterol (PERFOROMIST) 20 MCG/2ML nebulizer solution Take 2 mLs (20 mcg total) by nebulization 2 (two) times daily. 09/21/12  Yes Coralyn Helling, MD  furosemide (LASIX) 40 MG tablet Take 40 mg by mouth every morning.   Yes Historical Provider, MD  isosorbide mononitrate (IMDUR) 60 MG 24 hr tablet Take 60 mg by mouth every morning.   Yes Historical Provider, MD  levalbuterol [facility] HFA) 45 MCG/ACT inhaler Inhale 1-2 puffs into the lungs every 4 (four) hours as needed for wheezing or shortness of breath.   Yes Historical Provider, MD  levothyroxine (SYNTHROID, LEVOTHROID) 125 MCG tablet Take 0.5-1 tablets (62.5-125 mcg total) by mouth every other day. Alternate daily with a whole tablet ( ) or a half a tablet (6.33mcg) 09/01/12  Yes Jeoffrey Massed, MD  mirtazapine (REMERON) 15 MG tablet Take 15 mg by mouth at bedtime.   Yes Historical Provider, MD  nitroGLYCERIN (NITROSTAT) 0.3 MG SL tablet Place 1 tablet (0.3 mg total) under the tongue every 5 (five) minutes  as needed. For chest pain 05/04/12  Yes Herby Abraham, MD  omeprazole (PRILOSEC OTC) 20 MG tablet Take 20 mg by mouth daily.   Yes Historical Provider, MD  potassium chloride (K-DUR,KLOR-CON) 10 MEQ tablet Take 10 mEq by mouth daily.   Yes  Historical Provider, MD  sitaGLIPtin (JANUVIA) 100 MG tablet Take 100 mg by mouth daily.   Yes Historical Provider, MD  Tamsulosin HCl (FLOMAX) 0.4 MG CAPS Take 0.8 mg by mouth every morning.    Yes Historical Provider, MD  tiotropium (SPIRIVA) 18 MCG inhalation capsule Place 18 mcg into inhaler and inhale daily.   Yes Historical Provider, MD  cephALEXin (KEFLEX) 500 MG capsule Take 1 capsule (500 mg total) by mouth daily. 12/04/12   Antony Haste, MD  cyanocobalamin 1000 MCG/ML injection Inject 1,000 mcg into the muscle every 30 (thirty) days.     Historical Provider, MD  finasteride (PROSCAR) 5 MG tablet Take 5 mg by mouth at bedtime.     Historical Provider, MD  hyoscyamine (LEVSIN SL) 0.125 MG SL tablet Place 1 tablet (0.125 mg total) under the tongue every 4 (four) hours as needed for cramping. 12/04/12   Antony Haste, MD  warfarin (COUMADIN) 5 MG tablet Take 0.5-1 tablets (2.5-5 mg total) by mouth daily. Takes 1/2 tablet every day except Monday he takes 1 tablet 12/07/12   Antony Haste, MD   Physical Exam: Blood pressure 94/57, pulse 93, temperature 97.6 F (36.4 C), temperature source Oral, resp. rate 20, SpO2 100.00%. Filed Vitals:   12/04/12 1045 12/04/12 1100 12/04/12 1129 12/04/12 1342  BP: 121/100 117/71 131/79 94/57  Pulse: 58 61 72 93  Temp: 96.7 F (35.9 C) 97.4 F (36.3 C) 97.8 F (36.6 C) 97.6 F (36.4 C)  TempSrc:   Oral Oral  Resp: 23 18 25 20   SpO2: 100% 100% 100% 100%   Physical Exam  Constitutional: Appears to be in mild distress due to shortness of breath, on NRB HENT: Normocephalic. External right and left ear normal. Oropharynx is clear and moist.  Eyes: Conjunctivae and EOM are normal. PERRLA,  no scleral icterus.  Neck: Normal ROM. Neck supple. No JVD. No tracheal deviation. No thyromegaly.  CVS: RRR, S1/S2 +, no murmurs, no gallops, no carotid bruit.  Pulmonary: Diminished breath sounds at bases with expiratory wheezing, no accessory muscles used for breathing   Abdominal: Soft. BS +,  no distension, tenderness, rebound or guarding.  Musculoskeletal: Normal range of motion. No edema and no tenderness.  Lymphadenopathy: No lymphadenopathy noted, cervical, inguinal. Neuro: Alert. Normal reflexes, muscle tone coordination. No cranial nerve deficit. Skin: Skin is warm and dry. No rash noted. Not diaphoretic. No erythema. No pallor.  Psychiatric: Normal mood and affect. Behavior, judgment, thought content normal.   Labs on Admission:  Basic Metabolic Panel:  Recent Labs Lab 12/01/12 1540  NA 138  K 4.4  CL 106  CO2 25  GLUCOSE 108*  BUN 22  CREATININE 1.53*  CALCIUM 9.0   CBC:  Recent Labs Lab 12/01/12 1540 12/04/12 0947  WBC 4.9  --   HGB 11.1* 8.8*  HCT 36.5* 28.8*  MCV 89.7  --   PLT 177  --    CBG:  Recent Labs Lab 12/04/12 0631 12/04/12 1001  GLUCAP 116* 123*    Radiological Exams on Admission: No results found.  EKG: 12 lead EKG pending   Time spent: 75 minutes   Debbora Presto [facility] Pager [phone]  If 7PM-7AM, please contact night-coverage [email or web address] Password [facility] 12/04/2012, 2:33 PM

## 2012-12-04 NOTE — Progress Notes (Signed)
Called to room by pt's wife, pt stating "it's hard to breath". Pt gray in color and skin clammy, saturations 87, placed on nonrebreather and Rapid Response nurse called. Ruby rapid response nurse at bedside with in 5 min. Pt's saturations increased to 100 on nonrebreather, he states he feels somewhat better, skin slightly clammy. Dr. Izola Price and [facility] PA at bedside. Chest xray, labs and transfer to ICU ordered. Foley continues to have red bloody return, CBI kept at higher flow in order to decrease risk of clotting off. Will transfer to ICU asap.

## 2012-12-04 NOTE — Progress Notes (Signed)
Patient ID: Roberto [name], male   DOB: [birth year], 77 y.o.   MRN: [id number]   Pt SOB is better. Pt getting blood transfusion. He's thirsty and want something to drink.  PE: NAD On non-rebreather  Abd soft, NT His urine is light pink on a slow CBI gtt.   Imp - I've reviewed all the notes and reviewed labs, spoken with Dr. Izola Price, Dr. Berneice Heinrich, [facility] and family. Cont supportive care , appreciate hospitalist and [facility] management.  Hematuria continues to improve, cont CBI

## 2012-12-04 NOTE — Plan of Care (Signed)
Called by nsg that 3-way catheter not working well. Flushing in but not out. Pt's temp very low (94). Denies abd distenion or acute increase WOB.    Pt seen and examined, agree catheter malfunctioning. Catheter exchanged for new 107F coude tipped 3-way with wire core. Bladder then irrigated quantitatively in 70cc aliquots with 2L warm NS by hand. Copious clot removed and then very clear.   Quantitative irrigation suggests intact bladder. Belly soft. Reconnected to NS gtt at 3 per second and efflux light pink and working well. Suspect low temp partially due to high volume room temp irrigation, Will transition to 101 degree irrigation and bear hugger.  Nsg informed to call me anytime with issues.

## 2012-12-04 NOTE — Progress Notes (Signed)
Dr Berneice Heinrich in unit and observed foley catheter not draining.  Catheter irrigated by Dr Berneice Heinrich and flowing red/pink/no clots.

## 2012-12-04 NOTE — Progress Notes (Signed)
Late Entry Note:  Called to see post op patient for c/o acute SOB and hypotension.  Fluid bolus x 1, stat labs, ABG, Chest x-ray done.  Awaiting H/H results and then transfuse blood.  Unable to obtain SAT readings or Temp.   Transferred to SD.

## 2012-12-04 NOTE — Anesthesia Postprocedure Evaluation (Signed)
Anesthesia Post Note  Patient: Roberto [name]  Procedure(s) Performed: Procedure(s) (LRB): BIPOLAR TRANSURETHRAL RESECTION OF THE PROSTATE WITH GYRUS INSTRUMENTS (N/A)  Anesthesia type: General  Patient location: PACU  Post pain: Pain level controlled  Post assessment: Post-op Vital signs reviewed  Last Vitals:  Filed Vitals:   12/04/12 1015  BP: 137/58  Pulse: 51  Temp:   Resp: 17    Post vital signs: Reviewed  Level of consciousness: sedated  Complications: No apparent anesthesia complications

## 2012-12-04 NOTE — Progress Notes (Signed)
Patient ID: Roberto [name], male   DOB: Feb 06, 1936, 77 y.o.   MRN: [id number]   Asked to see pt due to hypotension and low O2 sat.  Pts BP had been 70s/50s with 89% O2 on 3L via [place].  He began to complain of SOB to the RN who placed him on a NRB with rise in sat to 90s.  A rapid response was called and Dr. Izola Price had already evaled the pt by the time I was asked to see him.  He was receiving a 500cc fluid bolus and a CXR was about to be taken.  His urine had been grossly bloody since procedure and CBIs were running.  Last Hg was 8.8.  I ordered a stat H/H and 2units of blood.  Blood will be held until CXR is reviewed as pt has a hx of CHF.  At time of eval pt's BP had increased to low 100s systolic and he was feeling better. Dr. Izola Price had arranged for pt to be moved to ICU and had consulted critical care for aid with the pt's medical issues.

## 2012-12-04 NOTE — Progress Notes (Signed)
Name: WAHID HOLLEY MRN: [id number] DOB: Jan 07, 1936  ELECTRONIC ICU PHYSICIAN NOTE  Problem:  Abnormal labs    Recent Labs Lab 12/01/12 1540 12/04/12 1520  NA 138 138  K 4.4 5.3*  CL 106 106  CO2 25 16*  BUN 22 25*  CREATININE 1.53* 1.59*  GLUCOSE 108* 283*    Recent Labs Lab 12/01/12 1540 12/04/12 0947 12/04/12 1520  HGB 11.1* 8.8* 7.3*  HCT 36.5* 28.8* 23.8*  WBC 4.9  --  10.7*  PLT 177  --  182      Intervention:  Will hold 2nd unit until recheck K   Sandrea Hughs 12/04/2012, 8:06 PM

## 2012-12-04 NOTE — Progress Notes (Signed)
Patient ID: Roberto [name], male   DOB: [birth year], 77 y.o.   MRN: [id number]  Pt without complaint. Watching TV.   Filed Vitals:   12/04/12 1129  BP: 131/79  Pulse: 72  Temp: 97.8 F (36.6 C)  Resp: 25     CBI running and urine is light red, CBI slower than in PACU.  Abd soft, NT  Imp s/p TURP Plan Supportive care - hematuria improved Clears advance to regular Check H/H at 1600

## 2012-12-04 NOTE — Progress Notes (Signed)
Pt without complaint in PACU. Hematuria remains more than I would expect and he remains on CBI. I spoke with his wife and it turns out he has remained on his ASA.   Filed Vitals:   12/04/12 1015  BP: 137/58  Pulse: 51  Temp:   Resp: 17   Hemoglobin & Hematocrit     Component Value Date/Time   HGB 8.8* 12/04/2012 0947   HCT 28.8* 12/04/2012 0947    NAD Abd - soft, NT Urine - red on a steady CBI  In light of his extensive CV and pulm hx, I'll have hospitalist see for poss transfusion and/or platelets, blood pressure management, etc. Appreciate hospitalist assistance.

## 2012-12-04 NOTE — Anesthesia Preprocedure Evaluation (Addendum)
Anesthesia Evaluation  Patient identified by MRN, date of birth, ID band Patient awake    Reviewed: Allergy & Precautions, H&P , NPO status , Patient's Chart, lab work & pertinent test results  Airway Mallampati: II TM Distance: >3 FB Neck ROM: Full    Dental  (+) Poor Dentition, Missing, Partial Upper and Partial Lower   Pulmonary shortness of breath, with exertion and at rest, sleep apnea (Noncompliant with CPAP) , pneumonia -, COPD COPD inhaler,  + rhonchi   + wheezing      Cardiovascular hypertension, + CAD, + Past MI, + Peripheral Vascular Disease, +CHF and DVT + dysrhythmias Atrial Fibrillation + Cardiac Defibrillator Rhythm:Regular Rate:Normal     Neuro/Psych Depression  Neuromuscular disease negative psych ROS   GI/Hepatic negative GI ROS, Neg liver ROS, hiatal hernia, GERD-  ,  Endo/Other  negative endocrine ROSdiabetesHypothyroidism   Renal/GU Renal InsufficiencyRenal diseasenegative Renal ROS  negative genitourinary   Musculoskeletal negative musculoskeletal ROS (+)   Abdominal   Peds  Hematology negative hematology ROS (+)   Anesthesia Other Findings Significant DOE with minimal exertional tolerance. Patient indicates he uses sublingual NTG for frequent chest/cardiac pain.  Reproductive/Obstetrics                         Anesthesia Physical Anesthesia Plan  ASA: IV  Anesthesia Plan: Spinal   Post-op Pain Management:    Induction: Intravenous  Airway Management Planned:   Additional Equipment:   Intra-op Plan:   Post-operative Plan: Extubation in OR  Informed Consent: I have reviewed the patients History and Physical, chart, labs and discussed the procedure including the risks, benefits and alternatives for the proposed anesthesia with the patient or authorized representative who has indicated his/her understanding and acceptance.   Dental advisory given  Plan Discussed  with: CRNA  Anesthesia Plan Comments: (Note borderline PT/PTT on lab report. Because of multiple pulmonary and cardiac abnormalities plan to proceed with SAB with small gauge needle.)        Anesthesia Quick Evaluation

## 2012-12-04 NOTE — Consult Note (Signed)
PULMONARY  / CRITICAL CARE MEDICINE  Name: Roberto [name] MRN: [id number] DOB: 10-11-1935    ADMISSION DATE:  12/04/2012 CONSULTATION DATE:  12/04/12  REFERRING MD :  Danie Binder, MD PRIMARY SERVICE: [facility]  CHIEF COMPLAINT:  Post op TURP, acute on chronic respiratory failure.  BRIEF PATIENT DESCRIPTION:  77 years old male with PMH relevant for ischemic cardiomyopathy, AAA,  post CABG, post pacemaker, last echo with LVEF of 35%. He also has COPD with chronic respiratory failure. Presented electively for TURP this morning. At about 2 pm he became SOB and hypoxic, rapid response was called and the patient was transferred to the SD unit under the Hospitalist service.  SIGNIFICANT EVENTS / STUDIES:   12/04/12 Rapid response called and transferred to SD for hypoxemia and hypotension.  LINES / TUBES: - Foley catheter - Peripheral IV's  CULTURES: Ordered 12/04/12  ANTIBIOTICS: - PO cipro  HISTORY OF PRESENT ILLNESS:   77 years old male with PMH relevant for ischemic cardiomyopathy, A. Fib on coumadin, AAA,  post CABG, post AICD, last echo with LVEF of 35%. He also has COPD with chronic respiratory failure, CKD, DM.  Presented electively for TURP this morning. At about 2 pm he became SOB and hypoxic, rapid response was called and the patient was transferred to the SD unit under the Hospitalist service. Critical care consult called to assist with management. At the time of my exam the patient is awake, alert, oriented x 3, with BP of 108/70, HR in the 50's and saturating 100% on 50% FiO2. His RR is 20 to 24/min. Of note, his rectal temperature was 92 F. He complains ob dull back pain likely from being in bed. He does have SOB but is improved compared to a couple of hours ago and states that he is feeling better.   PAST MEDICAL HISTORY :  Past Medical History  Diagnosis Date  . AAA (abdominal aortic aneurysm)     a. CTA 09/2011: 4.1-4.2cm infrarenal AAA.  Marland Kitchen Aneurysm of thoracic aorta  1995    a. Rupture 1995 w/spontaneous resolution;  b. CTA 09/2011: 6.8cm dist, 5.7cm mid  . Popliteal artery aneurysm   . Myocardial infarction   . Ischemic cardiomyopathy     10/2011 ECHO: EF 30-35%, apical akinesis  . Paroxysmal atrial fibrillation   . Hypertension   . Peripheral vascular disease   . Dyslipidemia   . Diabetes mellitus 2007    dx'd 2007 w/persisting FBS>125  . GERD (gastroesophageal reflux disease)   . Hiatal hernia   . Diverticular disease   . BPH (benign prostatic hypertrophy)     with hx of acute urinary retention (pt can self-cath)  . COPD (chronic obstructive pulmonary disease)     05/09/08 PFT FEV1 1.84 (55%), FVC 4.42 (95%), TLC 6.46 (92%), DLCO 72%, no BD response.  Overnight oximetry 10/2011 normal on RA.  Marland Kitchen Pneumonia 1/09  . Allergic rhinitis   . Shingles     X 2 episodes, both in left scapula area  . OSA (obstructive sleep apnea) 06/14/09    PSG RDI 17, PLMI 96, intolerant of CPAP or BPAP  . Chronic renal insufficiency     Baseline Cr 1.5  . Prostate cancer 07/2008    Low grade; watchful waiting with [name]  . Pulmonary nodule, left 03/02/2010    Left base; resolved on f/u CT  . Allergic rhinitis   . Carpal tunnel syndrome of left wrist 02/11/2011  . IMPLANTATION OF DEFIBRILLATOR, HX OF  09/02/2008    Medtronic 7232Cx Maximo V. Had a 6949 lead - failed and therapies turned off.  . IRON DEFICIENCY 06/08/2010    Hemoccult negative:  GI ([name]) recommended no invasive diagnostics--watchful waiting (2012).  . UNSPECIFIED ANEMIA 06/01/2010    Multifactorial: iron def, CRI, vit B12 def,   . Hypotension 04/14/2012  . CHF (congestive heart failure)   . Atrial fibrillation   . CAD (coronary artery disease)     post CABG with prior percutaneous intervention of the saphenous vein graft with known saphenous vein graft disease.  . Automatic implantable cardioverter-defibrillator in situ     turned off per office visit note of Dr Excell Seltzer   .  Hypothyroidism   . Shortness of breath   . Arthritis    Past Surgical History  Procedure Laterality Date  . Cardiac defibrillator placement  08/28/2004    [name]  . Colon surgery  1995    partial colectomy  . Coronary artery bypass graft  1996  . Aorta - bilateral femoral artery bypass graft  1999    [name] Early  . Nose surgery    . Insertion of pacing lead      New rate sensing pacing lead with removal of a previous implanted ICD and insertion of a device back in the pocket with defibrillation threshold testing  . Abdominal aortic aneurysm repair  1999    Ingrarenal repair/ graft  . Stents      Placed to the saphenous vein graft  . Radiofrequency ablation  2006    A flutter  . Esophagogastroduodenoscopy (egd) with esophageal dilation  05/28/2012    Procedure: ESOPHAGOGASTRODUODENOSCOPY (EGD) WITH ESOPHAGEAL DILATION;  Surgeon: Rachael Fee, MD;  Location: [facility];  Service: Endoscopy;  Laterality: N/A;  . Coronary angioplasty    . Coronary stents      Prior to Admission medications   Medication Sig Start Date End Date Taking? Authorizing Provider  acetaminophen (TYLENOL) 500 MG tablet Take 1,000 mg by mouth every 6 (six) hours as needed. For headache, pain or fever.   Yes Historical Provider, MD  albuterol (PROVENTIL) (2.5 MG/3ML) 0.083% nebulizer solution Take 3 mLs (2.5 mg total) by nebulization 4 (four) times daily as needed. Dx 496 09/29/12  Yes Coralyn Helling, MD  amiodarone (PACERONE) 200 MG tablet Take 1 tablet (200 mg total) by mouth every morning. 10/09/12  Yes Herby Abraham, MD  aspirin EC 81 MG tablet Take 81 mg by mouth every morning.   Yes Historical Provider, MD  budesonide (PULMICORT) 0.25 MG/2ML nebulizer solution Take 2 mLs (0.25 mg total) by nebulization 2 (two) times daily. 09/29/12  Yes Coralyn Helling, MD  carvedilol (COREG) 3.125 MG tablet Take 3.125 mg by mouth 2 (two) times daily with a meal.   Yes Historical Provider, MD  cetirizine (ZYRTEC)  10 MG tablet Take 10 mg by mouth every morning.    Yes Historical Provider, MD  formoterol (PERFOROMIST) 20 MCG/2ML nebulizer solution Take 2 mLs (20 mcg total) by nebulization 2 (two) times daily. 09/21/12  Yes Coralyn Helling, MD  furosemide (LASIX) 40 MG tablet Take 40 mg by mouth every morning.   Yes Historical Provider, MD  isosorbide mononitrate (IMDUR) 60 MG 24 hr tablet Take 60 mg by mouth every morning.   Yes Historical Provider, MD  levalbuterol [place] HFA) 45 MCG/ACT inhaler Inhale 1-2 puffs into the lungs every 4 (four) hours as needed for wheezing or shortness of breath.   Yes  Historical Provider, MD  levothyroxine (SYNTHROID, LEVOTHROID) 125 MCG tablet Take 0.5-1 tablets (62.5-125 mcg total) by mouth every other day. Alternate daily with a whole tablet ( ) or a half a tablet (6.25mcg) 09/01/12  Yes Jeoffrey Massed, MD  mirtazapine (REMERON) 15 MG tablet Take 15 mg by mouth at bedtime.   Yes Historical Provider, MD  nitroGLYCERIN (NITROSTAT) 0.3 MG SL tablet Place 1 tablet (0.3 mg total) under the tongue every 5 (five) minutes as needed. For chest pain 05/04/12  Yes Herby Abraham, MD  omeprazole (PRILOSEC OTC) 20 MG tablet Take 20 mg by mouth daily.   Yes Historical Provider, MD  potassium chloride (K-DUR,KLOR-CON) 10 MEQ tablet Take 10 mEq by mouth daily.   Yes Historical Provider, MD  sitaGLIPtin (JANUVIA) 100 MG tablet Take 100 mg by mouth daily.   Yes Historical Provider, MD  Tamsulosin HCl (FLOMAX) 0.4 MG CAPS Take 0.8 mg by mouth every morning.    Yes Historical Provider, MD  tiotropium (SPIRIVA) 18 MCG inhalation capsule Place 18 mcg into inhaler and inhale daily.   Yes Historical Provider, MD  cephALEXin (KEFLEX) 500 MG capsule Take 1 capsule (500 mg total) by mouth daily. 12/04/12   Antony Haste, MD  cyanocobalamin 1000 MCG/ML injection Inject 1,000 mcg into the muscle every 30 (thirty) days.     Historical Provider, MD  finasteride (PROSCAR) 5 MG tablet Take 5 mg  by mouth at bedtime.     Historical Provider, MD  hyoscyamine (LEVSIN SL) 0.125 MG SL tablet Place 1 tablet (0.125 mg total) under the tongue every 4 (four) hours as needed for cramping. 12/04/12   Antony Haste, MD  warfarin (COUMADIN) 5 MG tablet Take 0.5-1 tablets (2.5-5 mg total) by mouth daily. Takes 1/2 tablet every day except Monday he takes 1 tablet 12/07/12   Antony Haste, MD   Allergies  Allergen Reactions  . Atorvastatin     REACTION: muscle ache in legs  . Crestor (Rosuvastatin Calcium) Other (See Comments)    Lower ext fatigue and soreness- if taken everyday  . Lisinopril Diarrhea  . Morphine     REACTION: AMS - agitation  . Prednisone     REACTION: Can't breath  . Trazodone And Nefazodone Other (See Comments)    "drunk"  . Zolpidem Tartrate     REACTION: AMS    FAMILY HISTORY:  Family History  Problem Relation Age of Onset  . Stroke Mother 80  . Heart attack Mother     CVA  . Cancer Father 70    Lung  . Heart attack Father 10  . Lung cancer Father   . Coronary artery disease Other     2 of 5 siblings with CAD   SOCIAL HISTORY:  reports that he quit smoking about 9 years ago. His smoking use included Cigarettes. He has a 30 pack-year smoking history. He quit smokeless tobacco use about 9 years ago. His smokeless tobacco use included Chew. He reports that he drinks about 0.6 ounces of alcohol per week. He reports that he does not use illicit drugs.  REVIEW OF SYSTEMS:   All systems reviewed and found negative except for what I mentioned in the HPI.  SUBJECTIVE:   VITAL SIGNS: Temp:  [92 F (33.3 C)-100.7 F (38.2 C)] 94.4 F (34.7 C) (07/25 2046) Pulse Rate:  [51-98] 54 (07/25 2046) Resp:  [12-28] 23 (07/25 2046) BP: (62-144)/(36-100) 100/61 mmHg (07/25 2046) SpO2:  [90 %-100 %] 100 % (07/25  2046) FiO2 (%):  [50 %] 50 % (07/25 2046) Weight:  [156 lb 15.5 oz (71.2 kg)] 156 lb 15.5 oz (71.2 kg) (07/25 1600) HEMODYNAMICS:   VENTILATOR  SETTINGS: Vent Mode:  [-]  FiO2 (%):  [50 %] 50 % INTAKE / OUTPUT: Intake/Output     07/25 0701 - 07/26 0700   I.V. (mL/kg) 2621.7 (36.8)   Blood 362.5   Other 14000   Total Intake(mL/kg) 16984.2 (238.5)   Urine (mL/kg/hr) 21625 (21.5)   Total Output 16109   Net -4640.8         PHYSICAL EXAMINATION: General: Mild respiratory distress. Eyes: Anicteric sclerae. ENT: Oropharynx clear. Dry mucous membranes. No thrush Lymph: No cervical, supraclavicular, or axillary lymphadenopathy. Heart: Regular rhytm. No murmurs, rubs, or gallops appreciated. No bruits, equal pulses. Lungs: Diminished breath sounds bilaterally, without wheezes or crackles. Normal upper airway sounds without evidence of stridor. Abdomen: Abdomen soft, non-tender and not distended, normoactive bowel sounds. No hepatosplenomegaly or masses. Musculoskeletal: No clubbing or synovitis. Skin: No rashes or lesions Neuro: No focal neurologic deficits. Awake and alert.  LABS:  Recent Labs Lab 12/01/12 1540 12/04/12 0630 12/04/12 0947 12/04/12 1502 12/04/12 1520 12/04/12 2020  HGB 11.1*  --  8.8*  --  7.3* 8.9*  WBC 4.9  --   --   --  10.7* 16.0*  PLT 177  --   --   --  182 166  NA 138  --   --   --  138 136  K 4.4  --   --   --  5.3* 6.0*  CL 106  --   --   --  106 104  CO2 25  --   --   --  16* 12*  GLUCOSE 108*  --   --   --  283* 191*  BUN 22  --   --   --  25* 27*  CREATININE 1.53*  --   --   --  1.59* 1.87*  CALCIUM 9.0  --   --   --  8.6 8.4  APTT 40*  --   --   --   --   --   INR 2.10* 1.34  --   --   --   --   LATICACIDVEN  --   --   --   --  5.6*  --   TROPONINI  --   --   --   --  <0.30  --   PHART  --   --   --  7.442  --   --   PCO2ART  --   --   --  27.2*  --   --   PO2ART  --   --   --  268.0*  --   --     Recent Labs Lab 12/04/12 0631 12/04/12 1001  GLUCAP 116* 123*    CXR:  No acute infiltrates  ASSESSMENT / PLAN:  PULMONARY A: 1) Acute on chronic respiratory failure, now on  50% FiOs. No acute infiltrates on chest X ray. P:   - Continue Brovana / Pulmicort / Spiriva - Continue scheduled and PRN albuterol - Incentive spirometry - Agree with IV steroids - Continue supplemetal oxygen and titrate to goal O2 sat of 92%   CARDIOVASCULAR A:  1) Ischemic cardiomyopathy 2) Systolic CHF 3) Hypotension likely secondary to blood loss and volume depletion 4) Hypothermia and elevated WBC, concern for sepsis 5) A fib, rate controlled P:  -  Agree with holding antihypertensives - Will stop maintenance IVF's and give boluses for hypotension (MAP <65) - I will hold lasix for tonight and assess again in the morning. - Continue amiodarone  RENAL A:   - CKD, Creatinine trending up but still close to baseline - Hyperkalemia K: 6 P:   - Will hold PO K - Kayexalate PO - Will hold the second unit of blood for now - Will follow K in 4 hrs  GASTROINTESTINAL A:   1) No issues P:   - PPI  HEMATOLOGIC A:   1) Acute blood loss anemia, last Hgb 8.9 after 1 unit of blood P:  - Second unit on hold, will give after K is better.  INFECTIOUS A:   1) Hypotension, hypothermia and elevated WBC. No obvious source of infection P:   - We will order blood cultures - We will continue PO cipro only for now but if he decompensates further we will broaden his antibiotics to cover for health care associated microorganisms.   ENDOCRINE A:   1) DM P:   1) Novolog sliding scale  NEUROLOGIC A:   1) No issues, awake, alert, oriented x 3    I have personally obtained a history, examined the patient, evaluated laboratory and imaging results, formulated the assessment and plan and placed orders. CRITICAL CARE: The patient is critically ill with multiple organ systems failure and requires high complexity decision making for assessment and support, frequent evaluation and titration of therapies, application of advanced monitoring technologies and extensive interpretation of  multiple databases. Critical Care Time devoted to patient care services described in this note is 60 minutes.   Overton Mam, MD Pulmonary and Critical Care Medicine [facility] Pager: [phone]  12/04/2012, 9:09 PM

## 2012-12-04 NOTE — Transfer of Care (Signed)
Immediate Anesthesia Transfer of Care Note  Patient: Roberto [name]  Procedure(s) Performed: Procedure(s) (LRB): BIPOLAR TRANSURETHRAL RESECTION OF THE PROSTATE WITH GYRUS INSTRUMENTS (N/A)  Patient Location: PACU  Anesthesia Type: Spinal  Level of Consciousness: sedated, patient cooperative and responds to stimulaton  Airway & Oxygen Therapy: Patient Spontanous Breathing and Patient connected to face mask oxgen  Post-op Assessment: Report given to PACU RN and Post -op Vital signs reviewed and stable  Post vital signs: Reviewed and stable T-12 level on release to PACU staff, denied pain or complaint.   Complications: No apparent anesthesia complications

## 2012-12-04 NOTE — Progress Notes (Signed)
INITIAL NUTRITION ASSESSMENT  DOCUMENTATION CODES Per approved criteria  -Not Applicable   INTERVENTION: Diet advancement per MD discretion Provide Glucerna Shakes BID when diet advanced  NUTRITION DIAGNOSIS: Predicted suboptimal energy intake related to recent surgery/medical condition as evidenced by pt s/p TURP and hypotensive with SOB .   Goal: Pt to meet >/= 90% of their estimated nutrition needs   Monitor:  Diet advancement PO intake Weight Labs  Reason for Assessment: Malnutrition Screening Tool, score of 2  77 y.o. male  Admitting Dx: BPH, hematuria, urinary retention  ASSESSMENT: Pt with history of BPH, urinary retention, gross hematuria believed to be from prostate source. He had primarily large median lobe on cystoscopy and elects to proceed with transurethral resection of the prostate. He's been well without fevers or dysuria. Pt hypotensive with SOB when attempting to visit pt and Rapid Response called. Per chart pt's usual body weight is 163 lbs.   Height: Ht Readings from Last 1 Encounters:  11/23/12 5' 11.5" (1.816 m)    Weight: Wt Readings from Last 1 Encounters:  11/23/12 157 lb (71.215 kg)    Ideal Body Weight: 175 lbs  % Ideal Body Weight: 90%  Wt Readings from Last 10 Encounters:  11/23/12 157 lb (71.215 kg)  11/04/12 156 lb 4 oz (70.875 kg)  09/25/12 157 lb 12 oz (71.555 kg)  09/21/12 160 lb 12.8 oz (72.938 kg)  09/08/12 161 lb 9.6 oz (73.301 kg)  09/04/12 157 lb (71.215 kg)  08/27/12 156 lb 12 oz (71.101 kg)  08/17/12 159 lb (72.122 kg)  08/05/12 155 lb (70.308 kg)  07/15/12 163 lb (73.936 kg)    Usual Body Weight: 163 lbs  % Usual Body Weight: 96%  BMI:  There is no weight on file to calculate BMI.  Estimated Nutritional Needs: Kcal: 1740-1880 Protein: 110-120 grams Fluid: 2.1 L  Skin: no notes  Diet Order:    EDUCATION NEEDS: -No education needs identified at this time   Intake/Output Summary (Last 24 hours) at  12/04/12 1537 Last data filed at 12/04/12 1425  Gross per 24 hour  Intake  16109 ml  Output  16325 ml  Net  -3625 ml    Last BM: PTA   Labs:   Recent Labs Lab 12/01/12 1540  NA 138  K 4.4  CL 106  CO2 25  BUN 22  CREATININE 1.53*  CALCIUM 9.0  GLUCOSE 108*    CBG (last 3)   Recent Labs  12/04/12 0631 12/04/12 1001  GLUCAP 116* 123*    Scheduled Meds: . albuterol  2.5 mg Nebulization Q4H  . [START ON 12/05/2012] amiodarone  200 mg Oral q morning - 10a  . arformoterol  15 mcg Nebulization Q12H  . budesonide  0.25 mg Nebulization BID  . ciprofloxacin  500 mg Oral BID  . [START ON 12/11/2012] cyanocobalamin  1,000 mcg Intramuscular Q30 days  . docusate sodium  100 mg Oral BID  . finasteride  5 mg Oral QHS  . furosemide  40 mg Oral q morning - 10a  . [START ON 12/07/2012] levothyroxine  125 mcg Oral Custom  . [START ON 12/05/2012] levothyroxine  62.5 mcg Oral Custom  . [START ON 12/05/2012] linagliptin  5 mg Oral Daily  . [START ON 12/05/2012] loratadine  10 mg Oral Daily  . mirtazapine  15 mg Oral QHS  . [START ON 12/05/2012] pantoprazole  40 mg Oral Daily  . [START ON 12/05/2012] potassium chloride  10 mEq Oral Daily  . [  START ON 12/05/2012] tamsulosin  0.8 mg Oral q morning - 10a  . [START ON 12/05/2012] tiotropium  18 mcg Inhalation Daily    Continuous Infusions: . sodium chloride 500 mL/hr at 12/04/12 1425  . sodium chloride irrigation      Past Medical History  Diagnosis Date  . AAA (abdominal aortic aneurysm)     a. CTA 09/2011: 4.1-4.2cm infrarenal AAA.  Marland Kitchen Aneurysm of thoracic aorta 1995    a. Rupture 1995 w/spontaneous resolution;  b. CTA 09/2011: 6.8cm dist, 5.7cm mid  . Popliteal artery aneurysm   . Myocardial infarction   . Ischemic cardiomyopathy     10/2011 ECHO: EF 30-35%, apical akinesis  . Paroxysmal atrial fibrillation   . Hypertension   . Peripheral vascular disease   . Dyslipidemia   . Diabetes mellitus 2007    dx'd 2007 w/persisting  FBS>125  . GERD (gastroesophageal reflux disease)   . Hiatal hernia   . Diverticular disease   . BPH (benign prostatic hypertrophy)     with hx of acute urinary retention (pt can self-cath)  . COPD (chronic obstructive pulmonary disease)     05/09/08 PFT FEV1 1.84 (55%), FVC 4.42 (95%), TLC 6.46 (92%), DLCO 72%, no BD response.  Overnight oximetry 10/2011 normal on RA.  Marland Kitchen Pneumonia 1/09  . Allergic rhinitis   . Shingles     X 2 episodes, both in left scapula area  . OSA (obstructive sleep apnea) 06/14/09    PSG RDI 17, PLMI 96, intolerant of CPAP or BPAP  . Chronic renal insufficiency     Baseline Cr 1.5  . Prostate cancer 07/2008    Low grade; watchful waiting with [name]  . Pulmonary nodule, left 03/02/2010    Left base; resolved on f/u CT  . Allergic rhinitis   . Carpal tunnel syndrome of left wrist 02/11/2011  . IMPLANTATION OF DEFIBRILLATOR, HX OF 09/02/2008    Medtronic 7232Cx Maximo V. Had a 6949 lead - failed and therapies turned off.  . IRON DEFICIENCY 06/08/2010    Hemoccult negative:  GI ([name]) recommended no invasive diagnostics--watchful waiting (2012).  . UNSPECIFIED ANEMIA 06/01/2010    Multifactorial: iron def, CRI, vit B12 def,   . Hypotension 04/14/2012  . CHF (congestive heart failure)   . Atrial fibrillation   . CAD (coronary artery disease)     post CABG with prior percutaneous intervention of the saphenous vein graft with known saphenous vein graft disease.  . Automatic implantable cardioverter-defibrillator in situ     turned off per office visit note of Dr Excell Seltzer   . Hypothyroidism   . Shortness of breath   . Arthritis     Past Surgical History  Procedure Laterality Date  . Cardiac defibrillator placement  08/28/2004    [name]  . Colon surgery  1995    partial colectomy  . Coronary artery bypass graft  1996  . Aorta - bilateral femoral artery bypass graft  1999    [name] Early  . Nose surgery    . Insertion of pacing lead       New rate sensing pacing lead with removal of a previous implanted ICD and insertion of a device back in the pocket with defibrillation threshold testing  . Abdominal aortic aneurysm repair  1999    Ingrarenal repair/ graft  . Stents      Placed to the saphenous vein graft  . Radiofrequency ablation  2006    A flutter  .  Esophagogastroduodenoscopy (egd) with esophageal dilation  05/28/2012    Procedure: ESOPHAGOGASTRODUODENOSCOPY (EGD) WITH ESOPHAGEAL DILATION;  Surgeon: Rachael Fee, MD;  Location: [facility];  Service: Endoscopy;  Laterality: N/A;  . Coronary angioplasty    . Coronary stents       Ian Malkin RD, LDN Inpatient Clinical Dietitian Pager: [phone] After Hours Pager: [phone]

## 2012-12-05 ENCOUNTER — Inpatient Hospital Stay (HOSPITAL_COMMUNITY): Payer: Medicare Other

## 2012-12-05 ENCOUNTER — Observation Stay (HOSPITAL_COMMUNITY): Payer: Medicare Other

## 2012-12-05 DIAGNOSIS — T68XXXA Hypothermia, initial encounter: Secondary | ICD-10-CM

## 2012-12-05 DIAGNOSIS — N179 Acute kidney failure, unspecified: Secondary | ICD-10-CM | POA: Diagnosis present

## 2012-12-05 DIAGNOSIS — R7401 Elevation of levels of liver transaminase levels: Secondary | ICD-10-CM | POA: Diagnosis present

## 2012-12-05 DIAGNOSIS — D62 Acute posthemorrhagic anemia: Secondary | ICD-10-CM | POA: Diagnosis present

## 2012-12-05 DIAGNOSIS — N189 Chronic kidney disease, unspecified: Secondary | ICD-10-CM | POA: Diagnosis present

## 2012-12-05 DIAGNOSIS — J9620 Acute and chronic respiratory failure, unspecified whether with hypoxia or hypercapnia: Secondary | ICD-10-CM

## 2012-12-05 DIAGNOSIS — E875 Hyperkalemia: Secondary | ICD-10-CM | POA: Diagnosis present

## 2012-12-05 DIAGNOSIS — E872 Acidosis: Secondary | ICD-10-CM | POA: Diagnosis present

## 2012-12-05 DIAGNOSIS — I5020 Unspecified systolic (congestive) heart failure: Secondary | ICD-10-CM | POA: Diagnosis present

## 2012-12-05 LAB — GLUCOSE, CAPILLARY
Glucose-Capillary: 173 mg/dL — ABNORMAL HIGH (ref 70–99)
Glucose-Capillary: 171 mg/dL — ABNORMAL HIGH (ref 70–99)
Glucose-Capillary: 137 mg/dL — ABNORMAL HIGH (ref 70–99)

## 2012-12-05 LAB — BASIC METABOLIC PANEL
CO2: 17 mEq/L — ABNORMAL LOW (ref 19–32)
Calcium: 8 mg/dL — ABNORMAL LOW (ref 8.4–10.5)
Creatinine, Ser: 2.28 mg/dL — ABNORMAL HIGH (ref 0.50–1.35)
GFR calc non Af Amer: 27 mL/min — ABNORMAL LOW (ref >90)
GFR calc non Af Amer: 27 mL/min — ABNORMAL LOW (ref >90)
Glucose, Bld: 189 mg/dL — ABNORMAL HIGH (ref 70–99)
Glucose, Bld: 190 mg/dL — ABNORMAL HIGH (ref 70–99)
Potassium: 4.2 mEq/L (ref 3.5–5.1)
Sodium: 139 mEq/L (ref 135–145)
Sodium: 135 mEq/L (ref 135–145)

## 2012-12-05 LAB — COMPREHENSIVE METABOLIC PANEL
ALT: 228 U/L — ABNORMAL HIGH (ref 0–53)
CO2: 16 mEq/L — ABNORMAL LOW (ref 19–32)
Calcium: 7.7 mg/dL — ABNORMAL LOW (ref 8.4–10.5)
Creatinine, Ser: 2.15 mg/dL — ABNORMAL HIGH (ref 0.50–1.35)
GFR calc Af Amer: 33 mL/min — ABNORMAL LOW (ref >90)
GFR calc non Af Amer: 29 mL/min — ABNORMAL LOW (ref >90)
Glucose, Bld: 161 mg/dL — ABNORMAL HIGH (ref 70–99)

## 2012-12-05 LAB — BLOOD GAS, ARTERIAL
Acid-base deficit: 10.1 mmol/L — ABNORMAL HIGH (ref 0.0–2.0)
Drawn by: 310571
Drawn by: 310571
O2 Content: 5 L/min
O2 Saturation: 95.8 %
Patient temperature: 98.6
TCO2: 13.7 mmol/L (ref 0–100)
pCO2 arterial: 26.7 mmHg — ABNORMAL LOW (ref 35.0–45.0)
pO2, Arterial: 114 mmHg — ABNORMAL HIGH (ref 80.0–100.0)

## 2012-12-05 LAB — CBC
MCV: 89.3 fL (ref 78.0–100.0)
Platelets: 123 10*3/uL — ABNORMAL LOW (ref 150–400)
RBC: 3 MIL/uL — ABNORMAL LOW (ref 4.22–5.81)
WBC: 13.9 10*3/uL — ABNORMAL HIGH (ref 4.0–10.5)

## 2012-12-05 LAB — TROPONIN I
Troponin I: <0.3 ng/mL (ref <0.30)
Troponin I: 0.6 ng/mL (ref <0.30)

## 2012-12-05 LAB — PRO B NATRIURETIC PEPTIDE: Pro B Natriuretic peptide (BNP): 695.8 pg/mL — ABNORMAL HIGH (ref 0–125)

## 2012-12-05 LAB — HEMOGLOBIN A1C: Hgb A1c MFr Bld: 6.1 % — ABNORMAL HIGH (ref <5.7)

## 2012-12-05 MED ORDER — IOHEXOL 300 MG/ML  SOLN
50.0000 mL | Freq: Once | INTRAMUSCULAR | Status: AC | PRN
  Administered 2012-12-05: 50 mL

## 2012-12-05 MED ORDER — POLYETHYLENE GLYCOL 3350 17 G PO PACK
17.0000 g | PACK | Freq: Every day | ORAL | Status: DC
  Administered 2012-12-05 – 2012-12-10 (×6): 17 g via ORAL
  Filled 2012-12-05 (×7): qty 1

## 2012-12-05 MED ORDER — GI COCKTAIL ~~LOC~~
30.0000 mL | Freq: Three times a day (TID) | ORAL | Status: DC | PRN
  Administered 2012-12-05: 30 mL via ORAL
  Filled 2012-12-05: qty 30

## 2012-12-05 MED ORDER — ASPIRIN 300 MG RE SUPP
300.0000 mg | Freq: Every day | RECTAL | Status: DC
  Administered 2012-12-05: 300 mg via RECTAL
  Filled 2012-12-05 (×2): qty 1

## 2012-12-05 MED ORDER — CALCIUM CARBONATE ANTACID 500 MG PO CHEW
1.0000 | CHEWABLE_TABLET | Freq: Three times a day (TID) | ORAL | Status: DC | PRN
  Administered 2012-12-05 – 2012-12-06 (×4): 200 mg via ORAL
  Filled 2012-12-05 (×6): qty 1

## 2012-12-05 MED ORDER — METOPROLOL TARTRATE 1 MG/ML IV SOLN
INTRAVENOUS | Status: AC
  Filled 2012-12-05: qty 5

## 2012-12-05 MED ORDER — SENNOSIDES-DOCUSATE SODIUM 8.6-50 MG PO TABS
2.0000 | ORAL_TABLET | Freq: Two times a day (BID) | ORAL | Status: DC
  Administered 2012-12-05 – 2012-12-10 (×8): 2 via ORAL
  Filled 2012-12-05 (×14): qty 2

## 2012-12-05 MED ORDER — METOPROLOL TARTRATE 1 MG/ML IV SOLN
5.0000 mg | INTRAVENOUS | Status: DC
  Administered 2012-12-05 – 2012-12-09 (×22): 5 mg via INTRAVENOUS
  Filled 2012-12-05 (×27): qty 5

## 2012-12-05 MED ORDER — METOPROLOL TARTRATE 1 MG/ML IV SOLN
5.0000 mg | Freq: Four times a day (QID) | INTRAVENOUS | Status: DC
  Administered 2012-12-05: 5 mg via INTRAVENOUS

## 2012-12-05 MED ORDER — ALBUTEROL SULFATE (5 MG/ML) 0.5% IN NEBU
2.5000 mg | INHALATION_SOLUTION | RESPIRATORY_TRACT | Status: DC
  Administered 2012-12-05 – 2012-12-09 (×25): 2.5 mg via RESPIRATORY_TRACT
  Filled 2012-12-05 (×25): qty 0.5

## 2012-12-05 MED ORDER — LEVALBUTEROL HCL 0.63 MG/3ML IN NEBU
0.6300 mg | INHALATION_SOLUTION | RESPIRATORY_TRACT | Status: DC
  Filled 2012-12-05: qty 3

## 2012-12-05 MED ORDER — CEFTRIAXONE SODIUM 1 G IJ SOLR
1.0000 g | Freq: Every day | INTRAMUSCULAR | Status: DC
  Administered 2012-12-05 – 2012-12-08 (×4): 1 g via INTRAVENOUS
  Filled 2012-12-05 (×4): qty 10

## 2012-12-05 MED ORDER — SODIUM BICARBONATE 8.4 % IV SOLN
INTRAVENOUS | Status: DC
  Administered 2012-12-05 – 2012-12-06 (×2): via INTRAVENOUS
  Filled 2012-12-05 (×4): qty 150

## 2012-12-05 MED ORDER — BISACODYL 5 MG PO TBEC
5.0000 mg | DELAYED_RELEASE_TABLET | Freq: Every day | ORAL | Status: DC
  Administered 2012-12-05 – 2012-12-10 (×6): 5 mg via ORAL
  Filled 2012-12-05 (×6): qty 1

## 2012-12-05 MED ORDER — ALBUTEROL SULFATE (5 MG/ML) 0.5% IN NEBU: 2.5000 mg | INHALATION_SOLUTION | RESPIRATORY_TRACT | Status: DC

## 2012-12-05 MED ORDER — SODIUM POLYSTYRENE SULFONATE 15 GM/60ML PO SUSP
60.0000 g | Freq: Once | ORAL | Status: AC
  Administered 2012-12-05: 60 g via ORAL
  Filled 2012-12-05 (×2): qty 240

## 2012-12-05 MED ORDER — IPRATROPIUM BROMIDE 0.02 % IN SOLN
0.5000 mg | RESPIRATORY_TRACT | Status: DC
  Administered 2012-12-05 – 2012-12-09 (×25): 0.5 mg via RESPIRATORY_TRACT
  Filled 2012-12-05 (×25): qty 2.5

## 2012-12-05 NOTE — Progress Notes (Signed)
eLink Physician-Brief Progress Note Patient Name: BECKHAM CAPISTRAN DOB: 1936/02/04 MRN: [id number]  Date of Service  12/05/2012   HPI/Events of Note   AF/RVR Coreg, ASA held as NPO Mildly elevated Trop 0.6   eICU Interventions   Continue top trend Trop ASA suppository Increase Metoprolol IV frequency to q4h     Intervention Category Major Interventions: Arrhythmia - evaluation and management  Omega Durante 12/05/2012, 9:07 PM

## 2012-12-05 NOTE — Progress Notes (Signed)
Subjective:  1 - Prostatic Hyperplasia With Recurrent Hematuria - s/p transurethral resection of prostate 12/04/12 by Mena Goes, admitted to ICU for close monitoring and acute COPD exacerbation. Also continuous bladder irrigation which required hand irrigation and catheter exchange late 7/25.  Received 1upRBC 7/25.  2 - Renal Insufficiency - Baseline Cr 1.5-2 range in past year.   Today Roberto [name] continues to complain of dyspnea. Catheter now working much better and draining much clearer, hgb >8.  Objective: Vital signs in last 24 hours: Temp:  [92 F (33.3 C)-100.7 F (38.2 C)] 97.5 F (36.4 C) (07/26 0900) Pulse Rate:  [51-98] 67 (07/26 0900) Resp:  [12-28] 21 (07/26 0900) BP: (62-186)/(36-121) 161/78 mmHg (07/26 0800) SpO2:  [90 %-100 %] 100 % (07/26 0912) FiO2 (%):  [50 %] 50 % (07/26 0306) Weight:  [71.2 kg (156 lb 15.5 oz)] 71.2 kg (156 lb 15.5 oz) (07/25 1600) Last BM Date: 12/02/12  Intake/Output from previous day: 07/25 0701 - 07/26 0700 In: 16109.6 [P.O.:600; I.V.:2621.7; Blood:712.5] Out: 04540 [Urine:44625] Intake/Output this shift:    General appearance: alert, cooperative, appears stated age and dyspnic Head: Normocephalic, without obvious abnormality, atraumatic Eyes: conjunctivae/corneas clear. PERRL, EOM's intact. Fundi benign. Ears: normal TM's and external ear canals both ears Nose: Nares normal. Septum midline. Mucosa normal. No drainage or sinus tenderness. Throat: lips, mucosa, and tongue normal; teeth and gums normal Neck: no adenopathy, no carotid bruit, no JVD, supple, symmetrical, trachea midline and thyroid not enlarged, symmetric, no tenderness/mass/nodules Back: symmetric, no curvature. ROM normal. No CVA tenderness. Resp: clear to auscultation bilaterally and increased WOB on facemask, bedside O2 sat 100% Chest wall: no tenderness Cardio: regular rate and rhythm, S1, S2 normal, no murmur, click, rub or gallop GI: soft, non-tender; bowel sounds normal;  no masses,  no organomegaly and Non tender, non-rigid, non-distended. Male genitalia: normal, 57F 3 way catheter to warmed NS irriation at 2gtt per second, efflux now very light pink w/o clots. Extremities: extremities normal, atraumatic, no cyanosis or edema Pulses: 2+ and symmetric Skin: Skin color, texture, turgor normal. No rashes or lesions Lymph nodes: Cervical, supraclavicular, and axillary nodes normal. Neurologic: Grossly normal  Lab Results:   Recent Labs  12/04/12 2020 12/05/12 0155  WBC 16.0* 13.9*  HGB 8.9* 8.4*  HCT 29.2* 26.8*  PLT 166 123*   BMET  Recent Labs  12/05/12 0155 12/05/12 0731  NA 137 139  K 6.3* 5.8*  CL 109 110  CO2 16* 16*  GLUCOSE 161* 189*  BUN 29* 31*  CREATININE 2.15* 2.28*  CALCIUM 7.7* 8.0*   PT/INR  Recent Labs  12/04/12 0630  LABPROT 16.3*  INR 1.34   ABG  Recent Labs  12/04/12 1502 12/05/12 0900  PHART 7.442 7.203*  HCO3 18.3* 10.9*    Studies/Results: Dg Chest Port 1 View  12/05/2012   *RADIOLOGY REPORT*  Clinical Data: Worsening shortness of breath.  Hypotension.  Renal insufficiency.  PORTABLE CHEST - 1 VIEW 12/05/2012 0851 hours:  Comparison: Portable chest x-ray yesterday.  Two-view chest x-ray 08/17/2012, 04/14/2012.  Findings: Prior sternotomy for CABG.  Cardiac silhouette upper normal in size to slightly enlarged but stable.  Left subclavian pacing defibrillator unchanged.  Suboptimal inspiration with atelectasis in the lung bases, right greater than left, new since yesterday.  Lungs otherwise clear.  IMPRESSION: Suboptimal inspiration accounts for bibasilar atelectasis, right greater than left.   Original Report Authenticated By: Hulan Saas, M.D.   Dg Chest Port 1 View  12/04/2012   *RADIOLOGY REPORT*  Clinical Data: Respiratory failure  PORTABLE CHEST - 1 VIEW  Comparison: 08/17/2012  Findings: Pacer / AICD.  Hyperinflation. Numerous leads and wires project over the chest.  Midline trachea.  Normal heart  size.  No pleural effusion or pneumothorax.  Biapical pleural thickening. Lower thoracic aortic aneurysm, as detailed previously.  Scarring at the left lung base.  IMPRESSION: Hyperinflation/COPD, without acute superimposed process.  Descending thoracic aortic aneurysm, grossly similar.   Original Report Authenticated By: Jeronimo Greaves, M.D.    Anti-infectives: Anti-infectives   Start     Dose/Rate Route Frequency Ordered Stop   12/04/12 2000  ciprofloxacin (CIPRO) tablet 500 mg     500 mg Oral 2 times daily 12/04/12 1135     12/04/12 0548  ceFAZolin (ANCEF) IVPB 2 g/50 mL premix  Status:  Discontinued     2 g 100 mL/hr over 30 Minutes Intravenous 30 min pre-op 12/04/12 0548 12/04/12 1134   12/04/12 0000  cephALEXin (KEFLEX) 500 MG capsule     500 mg Oral Daily 12/04/12 0947        Assessment/Plan:  1 - Prostatic Hyperplasia With Recurrent Hematuria - Improving POD1 with much less blood grossly. Continue CBI for now.   2 - Renal Insufficiency - Likely pre-renal / intrinsic renal from relative anemia periop. Will monitor, if GFR declines will consider renal imaging to verify no hydro.   3 - Greatly appreciate PCCM and hospitalist input.  [facility], Bennie Scaff 12/05/2012

## 2012-12-05 NOTE — Progress Notes (Signed)
ANTIBIOTIC CONSULT NOTE - INITIAL  Pharmacy Consult for ceftriaxone Indication: prophylaxis, small bladder rupture   Allergies  Allergen Reactions  . Atorvastatin     REACTION: muscle ache in legs  . Crestor (Rosuvastatin Calcium) Other (See Comments)    Lower ext fatigue and soreness- if taken everyday  . Lisinopril Diarrhea  . Morphine     REACTION: AMS - agitation  . Prednisone     REACTION: Can't breath  . Trazodone And Nefazodone Other (See Comments)    "drunk"  . Zolpidem Tartrate     REACTION: AMS    Patient Measurements: Height: 5\' 11"  (180.3 cm) Weight: 156 lb 15.5 oz (71.2 kg) IBW/kg (Calculated) : 75.3 Adjusted Body Weight:   Vital Signs: Temp: 98.3 F (36.8 C) (07/26 1900) Temp src: Axillary (07/26 1900) BP: 114/80 mmHg (07/26 2007) Pulse Rate: 88 (07/26 2131) Intake/Output from previous day: 07/25 0701 - 07/26 0700 In: 16109.6 [P.O.:600; I.V.:2621.7; Blood:712.5] Out: 04540 [Urine:44625] Intake/Output from this shift: Total I/O In: 150 [I.V.:150] Out: 2320 [Urine:2320]  Labs:  Recent Labs  12/04/12 1520 12/04/12 2020 12/05/12 0155 12/05/12 0731 12/05/12 1425  WBC 10.7* 16.0* 13.9*  --   --   HGB 7.3* 8.9* 8.4*  --   --   PLT 182 166 123*  --   --   CREATININE 1.59* 1.87* 2.15* 2.28* 2.26*   Estimated Creatinine Clearance: 28.9 ml/min (by C-G formula based on Cr of 2.26). No results found for this basename: VANCOTROUGH, Leodis Binet, VANCORANDOM, GENTTROUGH, GENTPEAK, GENTRANDOM, TOBRATROUGH, TOBRAPEAK, TOBRARND, AMIKACINPEAK, AMIKACINTROU, AMIKACIN,  in the last 72 hours   Microbiology: Recent Results (from the past 720 hour(s))  MRSA PCR SCREENING     Status: None   Collection Time    12/04/12  4:00 PM      Result Value Range Status   MRSA by PCR NEGATIVE  NEGATIVE Final   Comment:            The GeneXpert MRSA Assay (FDA     approved for NASAL specimens     only), is one component of a     comprehensive MRSA colonization   surveillance program. It is not     intended to diagnose MRSA     infection nor to guide or     monitor treatment for     MRSA infections.    Medical History: Past Medical History  Diagnosis Date  . AAA (abdominal aortic aneurysm)     a. CTA 09/2011: 4.1-4.2cm infrarenal AAA.  Marland Kitchen Aneurysm of thoracic aorta 1995    a. Rupture 1995 w/spontaneous resolution;  b. CTA 09/2011: 6.8cm dist, 5.7cm mid  . Popliteal artery aneurysm   . Myocardial infarction   . Ischemic cardiomyopathy     10/2011 ECHO: EF 30-35%, apical akinesis  . Paroxysmal atrial fibrillation   . Hypertension   . Peripheral vascular disease   . Dyslipidemia   . Diabetes mellitus 2007    dx'd 2007 w/persisting FBS>125  . GERD (gastroesophageal reflux disease)   . Hiatal hernia   . Diverticular disease   . BPH (benign prostatic hypertrophy)     with hx of acute urinary retention (pt can self-cath)  . COPD (chronic obstructive pulmonary disease)     05/09/08 PFT FEV1 1.84 (55%), FVC 4.42 (95%), TLC 6.46 (92%), DLCO 72%, no BD response.  Overnight oximetry 10/2011 normal on RA.  Marland Kitchen Pneumonia 1/09  . Allergic rhinitis   . Shingles     X 2 episodes, both  in left scapula area  . OSA (obstructive sleep apnea) 06/14/09    PSG RDI 17, PLMI 96, intolerant of CPAP or BPAP  . Chronic renal insufficiency     Baseline Cr 1.5  . Prostate cancer 07/2008    Low grade; watchful waiting with [name]  . Pulmonary nodule, left 03/02/2010    Left base; resolved on f/u CT  . Allergic rhinitis   . Carpal tunnel syndrome of left wrist 02/11/2011  . IMPLANTATION OF DEFIBRILLATOR, HX OF 09/02/2008    [employer] 7232Cx Maximo V. Had a 6949 lead - failed and therapies turned off.  . IRON DEFICIENCY 06/08/2010    Hemoccult negative:  GI ([name]) recommended no invasive diagnostics--watchful waiting (2012).  . UNSPECIFIED ANEMIA 06/01/2010    Multifactorial: iron def, CRI, vit B12 def,   . Hypotension 04/14/2012  . CHF (congestive heart  failure)   . Atrial fibrillation   . CAD (coronary artery disease)     post CABG with prior percutaneous intervention of the saphenous vein graft with known saphenous vein graft disease.  . Automatic implantable cardioverter-defibrillator in situ     turned off per office visit note of Dr Excell Seltzer   . Hypothyroidism   . Shortness of breath   . Arthritis     Medications:  Anti-infectives   Start     Dose/Rate Route Frequency Ordered Stop   12/05/12 2230  cefTRIAXone (ROCEPHIN) 1 g in dextrose 5 % 50 mL IVPB     1 g 100 mL/hr over 30 Minutes Intravenous Daily at bedtime 12/05/12 2226     12/04/12 2000  ciprofloxacin (CIPRO) tablet 500 mg  Status:  Discontinued     500 mg Oral 2 times daily 12/04/12 1135 12/05/12 2224   12/04/12 0548  ceFAZolin (ANCEF) IVPB 2 g/50 mL premix  Status:  Discontinued     2 g 100 mL/hr over 30 Minutes Intravenous 30 min pre-op 12/04/12 0548 12/04/12 1134   12/04/12 0000  cephALEXin (KEFLEX) 500 MG capsule     500 mg Oral Daily 12/04/12 0947       Assessment: Patient with small bladder rupture.  MD wants rocephin for prophylaxis.  Goal of Therapy:  Rocephin dosed based on patient weight and renal function   Plan:  Measure antibiotic drug levels at steady state Follow up culture results Rocephin 1gm iv q24hr  Aleene Davidson Crowford 12/05/2012,10:28 PM

## 2012-12-05 NOTE — Progress Notes (Signed)
Patient ID: Roberto [name], male   DOB: 1935/12/31, 77 y.o.   MRN: [id number]  [facility] PROGRESS NOTE  Roberto [name]:[id number] DOB: 08-12-1935 DOA: 12/04/2012 PCP: Jeoffrey Massed, MD  Brief narrative: Pt is 77 yo male with complicated and complex medical conditions including ischemic cardiomyopathy with EF 30 - 35% based on last 2 D ECHO 09/2012 (on Lasix at home), PAF on Aspirin, Coumadin, Amiodarone, COPD, CKD stage II - III with baseline Cr ~2 (per last BMP in 09/2012), BPH and urinary retention, CAD and s/p CABG, who has presented to [facility] for [facility]. He has tolerated procedure well earlier today 07/25 and after several hours upon arrival to the floor has suddenly decompensated with acute onset of dyspnea at rest, oxygen desaturation to low 80's, SBP in 80's. He was placed on NRB and has responded well. He explains he feels well at this time, denies chest pain or shortness of breath, no specific abdominal or urinary concerns, he denies cough, no fevers, chill, no LE swelling, no specific focal neurological symptoms.   Due to acute onset of hypoxic respiratory failure, pt required transfer to SDU, Dr. Mena Goes notified, PCCM notified as well.   Principal Problem:   Principal Problem:   Acute and chronic respiratory failure - most likely secondary to COPD in the setting of acute blood loss anemia - pt currently on NRB and still reports shortness of breath - continue BD's scheduled and as needed, solumedrol IV  - will see with PCCM if we should hold Amiodarone for now   COPD - management as noted above Active Problems:   Hypotension and Hypothermia - resolved, will plan on resuming home antihypertensive regimen Coreg and Isosorbide low dose  - continue to monitor in SDU    ISCHEMIC CARDIOMYOPATHY - last 2 D ECHO 30 -35% in May 2014, no signs of volume overload on exam  - daily weights, strict I's and O's  - Lasix still on hold for now   Atrial fibrillation - rate controlled   - will continue to hold Coumadin due to acute blood loss anemia  - discuss with PCCM holding Amiodarone as well   Systolic CHF - management as noted above, no signs of volume overload on exam this AM   Acute on chronic renal failure - CKD stage II, creatinine up from yesterday 1.59 --> 1.87 --> 2.15 - continuing to hold Lasix  - strict I's and O's, daily weights    Urinary retention - status post TURP, post op day #1, per urology    Hyperkalemia - status post 2 doses of kayexalaate    Leukocytosis - likely secondary to steroids, WBC trending down  - CBC in AM   Acute blood loss anemia - status post one unit of PRBC transfusion 12/04/2012 - close monitoring of Hg/Hct   Transaminitis - unclear etiology, ? Shock liver, liver panel checked 07/2012 was within normal limits  - repeat LFT's in AM   DIABETES MELLITUS, TYPE II - last A1C 07/2012 was < 6, will re check A1C - continue SSI   Hypothyroidism - will check TSH, continue current dose of Synthroid per home regimen    Constipation - last BP 3 days ago, continue colace and and add miralax, bisacodyl   HYPERTENSION - resume Isosorbide and Coreg   ABDOMINAL AORTIC ANEURYSM   PROSTATE CANCER  Consultants:  PCCM  Urology  Procedures/Studies: Dg Chest Port 1 View 12/04/2012  Hyperinflation/COPD, without acute superimposed process.  Descending thoracic aortic aneurysm,  grossly similar.     Antibiotics:  Cipro 07/25 -->  Code Status: Full Family Communication: Pt at bedside Disposition Plan: Home when medically stable  HPI/Subjective: No events overnight.   Objective: Filed Vitals:   12/05/12 0500 12/05/12 0600 12/05/12 0700 12/05/12 0735  BP: 137/121 149/82 186/80 154/82  Pulse: 65 65 66 66  Temp:      TempSrc:      Resp: 22 23 22 20   Height:      Weight:      SpO2: 100% 100% 100% 100%    Intake/Output Summary (Last 24 hours) at 12/05/12 0752 Last data filed at 12/05/12 0700  Gross per 24 hour  Intake  44934.17 ml  Output  09811 ml  Net 309.17 ml    Exam:   General:  Pt is alert, follows commands appropriately, not in acute distress  Cardiovascular: Regular rate and rhythm, S1/S2, no murmurs, no rubs, no gallops  Respiratory: Diminished breath sounds at bases with minimal end expiratory wheezing, on NRB   Abdomen: Somewhat hard, tender in lower abdominal quadrants, slightly distended, bowel sounds faint, no guarding  Extremities: No edema, pulses DP and PT palpable bilaterally  Neuro: Grossly nonfocal  Data Reviewed: Basic Metabolic Panel:  Recent Labs Lab 12/01/12 1540 12/04/12 1520 12/04/12 2020 12/05/12 0155  NA 138 138 136 137  K 4.4 5.3* 6.0* 6.3*  CL 106 106 104 109  CO2 25 16* 12* 16*  GLUCOSE 108* 283* 191* 161*  BUN 22 25* 27* 29*  CREATININE 1.53* 1.59* 1.87* 2.15*  CALCIUM 9.0 8.6 8.4 7.7*   Liver Function Tests:  Recent Labs Lab 12/05/12 0155  AST 214*  ALT 228*  ALKPHOS 53  BILITOT 0.3  PROT 5.3*  ALBUMIN 2.6*   CBC:  Recent Labs Lab 12/01/12 1540 12/04/12 0947 12/04/12 1520 12/04/12 2020 12/05/12 0155  WBC 4.9  --  10.7* 16.0* 13.9*  HGB 11.1* 8.8* 7.3* 8.9* 8.4*  HCT 36.5* 28.8* 23.8* 29.2* 26.8*  MCV 89.7  --  90.5 92.1 89.3  PLT 177  --  182 166 123*   Cardiac Enzymes:  Recent Labs Lab 12/04/12 1520  TROPONINI <0.30   CBG:  Recent Labs Lab 12/04/12 0631 12/04/12 1001 12/05/12 0011  GLUCAP 116* 123* 145*    Recent Results (from the past 240 hour(s))  MRSA PCR SCREENING     Status: None   Collection Time    12/04/12  4:00 PM      Result Value Range Status   MRSA by PCR NEGATIVE  NEGATIVE Final   Comment:            The GeneXpert MRSA Assay (FDA     approved for NASAL specimens     only), is one component of a     comprehensive MRSA colonization     surveillance program. It is not     intended to diagnose MRSA     infection nor to guide or     monitor treatment for     MRSA infections.     Scheduled  Meds: . albuterol  2.5 mg Nebulization Q4H  . amiodarone  200 mg Oral q morning - 10a  . arformoterol  15 mcg Nebulization Q12H  . budesonide  0.25 mg Nebulization BID  . ciprofloxacin  500 mg Oral BID  . docusate sodium  100 mg Oral BID  . finasteride  5 mg Oral QHS  . insulin aspart  2-6 Units Subcutaneous Q4H  . levothyroxine  125 mcg Oral Custom  . levothyroxine  62.5 mcg Oral Custom  . linagliptin  5 mg Oral Daily  . loratadine  10 mg Oral Daily  . methylPREDNISolone i  40 mg Intravenous Q12H  . mirtazapine  15 mg Oral QHS  . pantoprazole  40 mg Oral Daily  . tamsulosin  0.8 mg Oral q morning - 10a  . tiotropium  18 mcg Inhalation Daily   Continuous Infusions: . sodium chloride irrigation      Debbora Presto, MD  [facility] Pager [phone]  If 7PM-7AM, please contact night-coverage [email or web address] Password [facility] 12/05/2012, 7:52 AM   LOS: 1 day

## 2012-12-05 NOTE — Progress Notes (Signed)
Patient ID: Roberto [name], male   DOB: 1935/11/16, 77 y.o.   MRN: [id number]  Pt without complaints. No CP or SOB. Looks much better. I discussed patient with Dr. Berneice Heinrich and have reviewed all the notes, labs, CT imaging. Getting ASA PR due to a slight bump in troponin.   Filed Vitals:   12/05/12 2200  BP:   Pulse: 113  Temp:   Resp: 16  BP114/80  NAD Watching TV Abd - soft, NT, ND, no R/G Urine on CBI, clear, no hematuria    CBC    Component Value Date/Time   WBC 13.9* 12/05/2012 0155   RBC 3.00* 12/05/2012 0155   HGB 8.4* 12/05/2012 0155   HCT 26.8* 12/05/2012 0155   PLT 123* 12/05/2012 0155   MCV 89.3 12/05/2012 0155   MCH 28.0 12/05/2012 0155   MCHC 31.3 12/05/2012 0155   RDW 15.1 12/05/2012 0155   LYMPHSABS 0.9 08/05/2012 1518   MONOABS 0.5 08/05/2012 1518   EOSABS 0.1 08/05/2012 1518   BASOSABS 0.0 08/05/2012 1518     BMET    Component Value Date/Time   NA 135 12/05/2012 1425   K 4.2 12/05/2012 1425   CL 104 12/05/2012 1425   CO2 17* 12/05/2012 1425   GLUCOSE 190* 12/05/2012 1425   BUN 32* 12/05/2012 1425   CREATININE 2.26* 12/05/2012 1425   CREATININE 2.58* 09/25/2012 1610   CALCIUM 7.8* 12/05/2012 1425   GFRNONAA 27* 12/05/2012 1425   GFRAA 31* 12/05/2012 1425      I discussed with him CT results and the extraperitoneal bladder rupture. We discussed treatment with foley catheter for 2-3 weeks.  We discussed the events from yesterday were likely from some post-op bleeding which was surprising given the procedure was straightforward, did not take an excessive amount of time, and apparent hemostasis endoscopically in the OR. He stopped coumadin on Sat and his ASA Tues.   I appreciate the excellent care from Dr. Berneice Heinrich, Dr. Izola Price and PCCM team.   He needs ASA for cardiac even but I would caution on restarting coumadin just yet as he is at high risk of bleeding.

## 2012-12-05 NOTE — Plan of Care (Signed)
S: Called by nursing that pt with 8/10 abdominal pain that is somewhat positional, better with standing. No nausea / emesis. Trop I cycled by PCCM. No catheter problems today.  O:  NAD SNTND Foley draining well of slow NS gtt, hand irrigated quantitiively with 1L NS with minimal clot, but still pain.  A/P:  1 - Abd Pain After TURP - will obtain urgent CT Cystogram to further verify bladder integrity and r/o other intraabdominal process.  NPO until study reviewed.

## 2012-12-05 NOTE — Consult Note (Signed)
PULMONARY  / CRITICAL CARE MEDICINE  Name: Roberto [name] MRN: [id number] DOB: [birth year]    ADMISSION DATE:  12/04/2012 CONSULTATION DATE:  12/04/12  REFERRING MD :  Danie Binder, MD PRIMARY SERVICE: [facility]  CHIEF COMPLAINT:  Post op TURP, acute on chronic respiratory failure.    BRIEF   77 years old male with PMH relevant for ischemic cardiomyopathy, A. Fib on coumadin, AAA,  post CABG, post AICD, last echo with LVEF of 35%. He also has COPD with chronic respiratory failure, CKD, DM.  Presented electively for TURP this morning. At about 2 pm he became SOB and hypoxic, rapid response was called and the patient was transferred to the SD unit under the Hospitalist service. Critical care consult called to assist with management. At the time of my exam the patient is awake, alert, oriented x 3, with BP of 108/70, HR in the 50's and saturating 100% on 50% FiO2. His RR is 20 to 24/min. Of note, his rectal temperature was 92 F. He complains ob dull back pain likely from being in bed. He does have SOB but is improved compared to a couple of hours ago and states that he is feeling better. PCCM consulted 12/04/12 on admit date  LINES / TUBES: - Foley catheter - Peripheral IV's  CULTURES: Results for orders placed during the hospital encounter of 12/04/12  MRSA PCR SCREENING     Status: None   Collection Time    12/04/12  4:00 PM      Result Value Range Status   MRSA by PCR NEGATIVE  NEGATIVE Final   Comment:            The GeneXpert MRSA Assay (FDA     approved for NASAL specimens     only), is one component of a     comprehensive MRSA colonization     surveillance program. It is not     intended to diagnose MRSA     infection nor to guide or     monitor treatment for     MRSA infections.    ANTIBIOTICS: - PO cipro   SIGNIFICANT EVENTS / STUDIES:    12/04/12 Rapid response called and transferred to SD for hypoxemia and hypotension.   SUBJECTIVE/OVERNIGHT/INTERVAL  HX 12/05/12:  more dyspneic than baseline though sitting and talking on phone. Worsening bicarb and creat noted. ABG with metabolic acidosis - new onset.  BP okay. Was febrile last night and then low temp but now normal temp    VITAL SIGNS: Temp:  [92 F (33.3 C)-100.7 F (38.2 C)] 98.1 F (36.7 C) (07/26 0400) Pulse Rate:  [51-98] 66 (07/26 0735) Resp:  [12-28] 20 (07/26 0735) BP: (62-186)/(36-121) 154/82 mmHg (07/26 0735) SpO2:  [90 %-100 %] 100 % (07/26 0735) FiO2 (%):  [50 %] 50 % (07/26 0306) Weight:  [71.2 kg (156 lb 15.5 oz)] 71.2 kg (156 lb 15.5 oz) (07/25 1600) HEMODYNAMICS:   VENTILATOR SETTINGS: Vent Mode:  [-]  FiO2 (%):  [50 %] 50 % INTAKE / OUTPUT: Intake/Output     07/25 0701 - 07/26 0700 07/26 0701 - 07/27 0700   P.O. 600    I.V. (mL/kg) 2621.7 (36.8)    Blood 712.5    Other 41000    Total Intake(mL/kg) 44934.2 (631.1)    Urine (mL/kg/hr) 44625 (26.1)    Total Output 40981     Net +309.2            PHYSICAL EXAMINATION: General: Looks ok .  Cachexia. Smiling. Dyspneic Eyes: Anicteric sclerae. ENT: Oropharynx clear. Dry mucous membranes. No thrush Lymph: No cervical, supraclavicular, or axillary lymphadenopathy. Heart: Regular rhytm. No murmurs, rubs, or gallops appreciated. No bruits, equal pulses. Lungs: Diminished breath sounds bilaterally, without wheezes . Normal upper airway sounds without evidence of stridor. TACHYPNEIC. BASAL CRACKLEs + Abdomen: Abdomen soft, non-tender and not distended, normoactive bowel sounds. No hepatosplenomegaly or masses. Musculoskeletal: No clubbing or synovitis. Skin: No rashes or lesions Neuro: No focal neurologic deficits. Awake and alert.  LABS: PULMONARY  Recent Labs Lab 12/04/12 1502  PHART 7.442  PCO2ART 27.2*  PO2ART 268.0*  HCO3 18.3*  TCO2 17.4  O2SAT 98.6    CBC  Recent Labs Lab 12/04/12 1520 12/04/12 2020 12/05/12 0155  HGB 7.3* 8.9* 8.4*  HCT 23.8* 29.2* 26.8*  WBC 10.7* 16.0* 13.9*  PLT  182 166 123*    COAGULATION  Recent Labs Lab 12/01/12 1540 12/04/12 0630  INR 2.10* 1.34    CARDIAC    Recent Labs Lab 12/04/12 1520  TROPONINI <0.30   No results found for this basename: PROBNP,  in the last 168 hours   CHEMISTRY  Recent Labs Lab 12/01/12 1540 12/04/12 1520 12/04/12 2020 12/05/12 0155 12/05/12 0731  NA 138 138 136 137 139  K 4.4 5.3* 6.0* 6.3* 5.8*  CL 106 106 104 109 110  CO2 25 16* 12* 16* 16*  GLUCOSE 108* 283* 191* 161* 189*  BUN 22 25* 27* 29* 31*  CREATININE 1.53* 1.59* 1.87* 2.15* 2.28*  CALCIUM 9.0 8.6 8.4 7.7* 8.0*    LIVER  Recent Labs Lab 12/01/12 1540 12/04/12 0630 12/05/12 0155  AST  --   --  214*  ALT  --   --  228*  ALKPHOS  --   --  53  BILITOT  --   --  0.3  PROT  --   --  5.3*  ALBUMIN  --   --  2.6*  INR 2.10* 1.34  --      INFECTIOUS  Recent Labs Lab 12/04/12 1520  LATICACIDVEN 5.6*     ENDOCRINE CBG (last 3)   Recent Labs  12/04/12 0631 12/04/12 1001 12/05/12 0011  GLUCAP 116* 123* 145*         IMAGING x48h  Dg Chest Port 1 View  12/04/2012   *RADIOLOGY REPORT*  Clinical Data: Respiratory failure  PORTABLE CHEST - 1 VIEW  Comparison: 08/17/2012  Findings: Pacer / AICD.  Hyperinflation. Numerous leads and wires project over the chest.  Midline trachea.  Normal heart size.  No pleural effusion or pneumothorax.  Biapical pleural thickening. Lower thoracic aortic aneurysm, as detailed previously.  Scarring at the left lung base.  IMPRESSION: Hyperinflation/COPD, without acute superimposed process.  Descending thoracic aortic aneurysm, grossly similar.   Original Report Authenticated By: Jeronimo Greaves, M.D.    ASSESSMENT / PLAN:  PULMONARY A: 1) Acute on chronic respiratory failure, now on 50% FiOs.   12/05/12: Worsening dyspnea. Developing pulmnary congestion + metab acidossi related renal failure. High risk for worsening. Has cpap intolerance  P:   -  Change BD to pulmicort bid and  duoneb q4h (alb neb wil help with high K)  - repeat abg 3pm - bipap/intubation depending on couse - IV steroids - Incentive spirometry - BIcarg gtt for dyspnea related to acidosis - Continue supplemetal oxygen and titrate to goal O2 sat of 92%   CARDIOVASCULAR A:  1) Ischemic cardiomyopathy 2) Systolic CHF 3) Hypotension likely secondary to blood  loss and volume depletion 4) Hypothermia and elevated WBC, concern for sepsis 5) A fib, rate controlled  11/16/22: normal bP. HR 68.   P:  - Agree with holding antihypertensives - CHECK BNP  -No coreg in presnce of copd - I will hold lasix for tonight and assess again in the morning. - Continue amiodarone  RENAL A:   - CKD, Baseline crat 1.5  12/05/12: Worsening renal failur ewith gap and non-gap acidosis  P:   - Kayexalate PO -start bicarb gtt   GASTROINTESTINAL A:   1) constipated  P:   - senna docusate  HEMATOLOGIC A:   1) Acute blood loss anemia, last Hgb 8.9 after 1 unit of blood  12/05/12: Blood loss settled. Hgb stable  P:  - PRBC for hgb < 7gm%  Only   INFECTIOUS A:   1) Hypotension, hypothermia and elevated WBC. No obvious source of infection P:   - AWait  blood cultures - check Lactate and PCT - We will continue PO cipro only for now but if he decompensates further we will broaden his antibiotics to cover for health care associated microorganisms.   ENDOCRINE A:   1) DM P:   1) Novolog sliding scale  NEUROLOGIC A:   1) No issues, awake, alert, oriented x 3   The patient is critically ill with multiple organ systems failure and requires high complexity decision making for assessment and support, frequent evaluation and titration of therapies, application of advanced monitoring technologies and extensive interpretation of multiple databases.   Critical Care Time devoted to patient care services described in this note is  35  Minutes.  Dr. Kalman Shan, M.D., [facility].C.P Pulmonary and Critical  Care Medicine Staff Physician [facility] [place] [facility] Pager: [phone], If no answer or between  15:00h - 7:00h: call [phone]  12/05/2012 9:07 AM

## 2012-12-05 NOTE — Progress Notes (Signed)
eLink Physician-Brief Progress Note Patient Name: Roberto [name] DOB: 06-Aug-1935 MRN: [id number]  Date of Service  12/05/2012   HPI/Events of Note  Continued hyperkalemia with K of 6.3.  Had received 30 gm earlier with no response.   eICU Interventions  Plan: 60 gm kayexalate po times one dose now Repeat BMET @ 7am   Intervention Category Major Interventions: Electrolyte abnormality - evaluation and management  DETERDING,ELIZABETH 12/05/2012, 2:59 AM

## 2012-12-05 NOTE — Progress Notes (Signed)
Late Entry: At 2230, foley drained only 650 out over 3 hours with 4000 CBI irrigant in,  Flushed catheter with NS but did not have any return.  Unable to withdrawal from foley.  Dr. Kathrynn Running called with orders received and stated that he would come in.  After arrival MD irrigated and changed foley.  CBI currently running.  Foley intact and will continue to monitor.

## 2012-12-05 NOTE — Progress Notes (Signed)
CRITICAL VALUE ALERT  Critical value received:Potassium 6.3  Date of notification 12/05/2012  Time of notification: 0250   Critical value read back yes  Nurse who received alert:  Clayborne Artist, RN  MD notified (1st page):  Dr. Darrick Penna  Time of first page:  0251  MD notified (2nd page):no  Time of second page:no  Responding MD:  Dr. Darrick Penna  Time MD responded:  [phone]

## 2012-12-05 NOTE — Progress Notes (Signed)
CRITICAL VALUE ALERT  Critical value received:trp 0.60 Date of notification:  12/05/12  Time of notification:  2100 Critical value read back:yes  Nurse who received alert:  c Shalan Neault  MD notified (1st page): elink md-dr. [email or web address] Time of first page: 2102 MD notified (2nd page):  Time of second page:  Responding MD:  elink md-dr. zubelevitskiy   Time MD responded:  2102

## 2012-12-05 NOTE — Progress Notes (Addendum)
Patient ID: Roberto [name], male   DOB: 11/23/1935, 77 y.o.   MRN: [id number]  [facility] PROGRESS NOTE  JEFFREY GRAEFE WUJ:[id number] DOB: Feb 04, 1936 DOA: 12/04/2012 PCP: Jeoffrey Massed, MD  Brief narrative: Pt is 77 yo male with complicated and complex medical conditions including ischemic cardiomyopathy with EF 30 - 35% based on last 2 D ECHO 09/2012 (on Lasix at home), PAF on Aspirin, Coumadin, Amiodarone, COPD, CKD stage II - III with baseline Cr ~2 (per last BMP in 09/2012), BPH and urinary retention, CAD and s/p CABG, who has presented to [facility] for TUPR. He has tolerated procedure well earlier today 07/25 and after several hours upon arrival to the floor has suddenly decompensated with acute onset of dyspnea at rest, oxygen desaturation to low 80's, SBP in 80's. He was placed on NRB and has responded well. He explains he feels well at this time, denies chest pain or shortness of breath, no specific abdominal or urinary concerns, he denies cough, no fevers, chill, no LE swelling, no specific focal neurological symptoms.   Due to acute onset of hypoxic respiratory failure, pt required transfer to SDU, Dr. Mena Goes notified, PCCM notified as well.   Principal Problem:   Principal Problem:   Acute and chronic respiratory failure - most likely secondary to COPD in the setting of acute blood loss anemia - pt currently on NRB and still reports shortness of breath - continue BD's scheduled and as needed, solumedrol IV    COPD - management as noted above Active Problems:   Hypotension and Hypothermia - resolved, will plan on resuming home antihypertensive regimen Coreg and Isosorbide low dose  - continue to monitor in SDU    ISCHEMIC CARDIOMYOPATHY - last 2 D ECHO 30 -35% in May 2014, no signs of volume overload on exam  - daily weights, strict I's and O's  - Lasix still on hold for now   Atrial fibrillation - rate controlled  - will continue to hold Coumadin due to acute blood loss  anemia  - discuss with PCCM holding Amiodarone as well   Systolic CHF - management as noted above, no signs of volume overload on exam this AM   Acute on chronic renal failure - CKD stage II, creatinine up from yesterday 1.59 --> 1.87 --> 2.15 - continuing to hold Lasix  - strict I's and O's, daily weights    Urinary retention - status post TURP, post op day #1, per urology    Hyperkalemia - status post 2 doses of kayexalaate    Leukocytosis - likely secondary to steroids, WBC trending down  - CBC in AM   Acute blood loss anemia - status post one unit of PRBC transfusion 12/04/2012 - close monitoring of Hg/Hct   Transaminitis - unclear etiology, ? Shock liver, liver panel checked 07/2012 was within normal limits  - repeat LFT's in AM   DIABETES MELLITUS, TYPE II - last A1C 07/2012 was < 6, will re check A1C - continue SSI   Hypothyroidism - will check TSH, continue current dose of Synthroid per home regimen    Constipation - last BP 3 days ago, continue colace and and add miralax, bisacodyl   HYPERTENSION - continue to monitor in SDU   ABDOMINAL AORTIC ANEURYSM   PROSTATE CANCER  Consultants:  PCCM  Urology  Procedures/Studies: Dg Chest Port 1 View 12/04/2012  Hyperinflation/COPD, without acute superimposed process.  Descending thoracic aortic aneurysm, grossly similar.     Antibiotics:  Cipro 07/25 -->  Code Status: Full Family Communication: Pt at bedside Disposition Plan: Home when medically stable  HPI/Subjective: No events overnight.   Objective: Filed Vitals:   12/05/12 0500 12/05/12 0600 12/05/12 0700 12/05/12 0735  BP: 137/121 149/82 186/80 154/82  Pulse: 65 65 66 66  Temp:      TempSrc:      Resp: 22 23 22 20   Height:      Weight:      SpO2: 100% 100% 100% 100%    Intake/Output Summary (Last 24 hours) at 12/05/12 0838 Last data filed at 12/05/12 0700  Gross per 24 hour  Intake 43934.17 ml  Output  16109 ml  Net -690.83 ml     Exam:   General:  Pt is alert, follows commands appropriately, not in acute distress  Cardiovascular: Regular rate and rhythm, S1/S2, no murmurs, no rubs, no gallops  Respiratory: Diminished breath sounds at bases with minimal end expiratory wheezing, on NRB   Abdomen: Somewhat hard, tender in lower abdominal quadrants, slightly distended, bowel sounds faint, no guarding  Extremities: No edema, pulses DP and PT palpable bilaterally  Neuro: Grossly nonfocal  Data Reviewed: Basic Metabolic Panel:  Recent Labs Lab 12/01/12 1540 12/04/12 1520 12/04/12 2020 12/05/12 0155 12/05/12 0731  NA 138 138 136 137 139  K 4.4 5.3* 6.0* 6.3* 5.8*  CL 106 106 104 109 110  CO2 25 16* 12* 16* 16*  GLUCOSE 108* 283* 191* 161* 189*  BUN 22 25* 27* 29* 31*  CREATININE 1.53* 1.59* 1.87* 2.15* 2.28*  CALCIUM 9.0 8.6 8.4 7.7* 8.0*   Liver Function Tests:  Recent Labs Lab 12/05/12 0155  AST 214*  ALT 228*  ALKPHOS 53  BILITOT 0.3  PROT 5.3*  ALBUMIN 2.6*   CBC:  Recent Labs Lab 12/01/12 1540 12/04/12 0947 12/04/12 1520 12/04/12 2020 12/05/12 0155  WBC 4.9  --  10.7* 16.0* 13.9*  HGB 11.1* 8.8* 7.3* 8.9* 8.4*  HCT 36.5* 28.8* 23.8* 29.2* 26.8*  MCV 89.7  --  90.5 92.1 89.3  PLT 177  --  182 166 123*   Cardiac Enzymes:  Recent Labs Lab 12/04/12 1520  TROPONINI <0.30   CBG:  Recent Labs Lab 12/04/12 0631 12/04/12 1001 12/05/12 0011  GLUCAP 116* 123* 145*    Recent Results (from the past 240 hour(s))  MRSA PCR SCREENING     Status: None   Collection Time    12/04/12  4:00 PM      Result Value Range Status   MRSA by PCR NEGATIVE  NEGATIVE Final   Comment:            The GeneXpert MRSA Assay (FDA     approved for NASAL specimens     only), is one component of a     comprehensive MRSA colonization     surveillance program. It is not     intended to diagnose MRSA     infection nor to guide or     monitor treatment for     MRSA infections.      Scheduled Meds: . albuterol  2.5 mg Nebulization Q4H  . amiodarone  200 mg Oral q morning - 10a  . arformoterol  15 mcg Nebulization Q12H  . budesonide  0.25 mg Nebulization BID  . ciprofloxacin  500 mg Oral BID  . docusate sodium  100 mg Oral BID  . finasteride  5 mg Oral QHS  . insulin aspart  2-6 Units Subcutaneous Q4H  . levothyroxine  125  mcg Oral Custom  . levothyroxine  62.5 mcg Oral Custom  . linagliptin  5 mg Oral Daily  . loratadine  10 mg Oral Daily  . methylPREDNISolone i  40 mg Intravenous Q12H  . mirtazapine  15 mg Oral QHS  . pantoprazole  40 mg Oral Daily  . tamsulosin  0.8 mg Oral q morning - 10a  . tiotropium  18 mcg Inhalation Daily   Continuous Infusions: . sodium chloride irrigation      Debbora Presto, MD  [facility] Pager [phone]  If 7PM-7AM, please contact night-coverage [email or web address] Password [facility] 12/05/2012, 8:38 AM   LOS: 1 day

## 2012-12-05 NOTE — Progress Notes (Signed)
Utilization review complete 

## 2012-12-06 DIAGNOSIS — I714 Abdominal aortic aneurysm, without rupture: Secondary | ICD-10-CM

## 2012-12-06 DIAGNOSIS — I200 Unstable angina: Secondary | ICD-10-CM

## 2012-12-06 DIAGNOSIS — I219 Acute myocardial infarction, unspecified: Secondary | ICD-10-CM

## 2012-12-06 LAB — GLUCOSE, CAPILLARY
Glucose-Capillary: 160 mg/dL — ABNORMAL HIGH (ref 70–99)
Glucose-Capillary: 175 mg/dL — ABNORMAL HIGH (ref 70–99)
Glucose-Capillary: 221 mg/dL — ABNORMAL HIGH (ref 70–99)
Glucose-Capillary: 115 mg/dL — ABNORMAL HIGH (ref 70–99)

## 2012-12-06 LAB — BASIC METABOLIC PANEL
BUN: 28 mg/dL — ABNORMAL HIGH (ref 6–23)
Chloride: 102 mEq/L (ref 96–112)
Creatinine, Ser: 1.74 mg/dL — ABNORMAL HIGH (ref 0.50–1.35)
GFR calc Af Amer: 43 mL/min — ABNORMAL LOW (ref >90)

## 2012-12-06 LAB — COMPREHENSIVE METABOLIC PANEL
ALT: 175 U/L — ABNORMAL HIGH (ref 0–53)
AST: 145 U/L — ABNORMAL HIGH (ref 0–37)
Alkaline Phosphatase: 42 U/L (ref 39–117)
CO2: 24 mEq/L (ref 19–32)
Chloride: 104 mEq/L (ref 96–112)
GFR calc non Af Amer: 31 mL/min — ABNORMAL LOW (ref >90)
Sodium: 138 mEq/L (ref 135–145)
Total Bilirubin: 0.1 mg/dL — ABNORMAL LOW (ref 0.3–1.2)

## 2012-12-06 LAB — CBC
Hemoglobin: 8.7 g/dL — ABNORMAL LOW (ref 13.0–17.0)
MCH: 27.8 pg (ref 26.0–34.0)
MCH: 27.7 pg (ref 26.0–34.0)
MCHC: 31.9 g/dL (ref 30.0–36.0)
MCHC: 32.5 g/dL (ref 30.0–36.0)
Platelets: 107 10*3/uL — ABNORMAL LOW (ref 150–400)
RDW: 15.9 % — ABNORMAL HIGH (ref 11.5–15.5)

## 2012-12-06 LAB — TROPONIN I: Troponin I: 1.76 ng/mL (ref <0.30)

## 2012-12-06 LAB — PROCALCITONIN: Procalcitonin: 3.63 ng/mL

## 2012-12-06 MED ORDER — FUROSEMIDE 10 MG/ML IJ SOLN
60.0000 mg | Freq: Once | INTRAMUSCULAR | Status: AC
  Administered 2012-12-06: 60 mg via INTRAVENOUS

## 2012-12-06 MED ORDER — WARFARIN - PHARMACIST DOSING INPATIENT: Freq: Every day | Status: DC

## 2012-12-06 MED ORDER — CHLORHEXIDINE GLUCONATE 0.12 % MT SOLN
15.0000 mL | Freq: Two times a day (BID) | OROMUCOSAL | Status: DC
  Administered 2012-12-06 – 2012-12-09 (×7): 15 mL via OROMUCOSAL
  Filled 2012-12-06 (×8): qty 15

## 2012-12-06 MED ORDER — WARFARIN SODIUM 2.5 MG PO TABS
2.5000 mg | ORAL_TABLET | Freq: Once | ORAL | Status: AC
  Administered 2012-12-06: 2.5 mg via ORAL
  Filled 2012-12-06: qty 1

## 2012-12-06 MED ORDER — ASPIRIN 81 MG PO CHEW
81.0000 mg | CHEWABLE_TABLET | Freq: Every day | ORAL | Status: DC
  Administered 2012-12-06: 81 mg via ORAL
  Filled 2012-12-06 (×2): qty 1

## 2012-12-06 MED ORDER — POTASSIUM CHLORIDE CRYS ER 20 MEQ PO TBCR
40.0000 meq | EXTENDED_RELEASE_TABLET | Freq: Once | ORAL | Status: AC
  Administered 2012-12-06: 40 meq via ORAL
  Filled 2012-12-06: qty 2

## 2012-12-06 MED ORDER — BIOTENE DRY MOUTH MT LIQD
15.0000 mL | Freq: Two times a day (BID) | OROMUCOSAL | Status: DC
  Administered 2012-12-06 – 2012-12-09 (×8): 15 mL via OROMUCOSAL

## 2012-12-06 MED ORDER — ALBUTEROL SULFATE (5 MG/ML) 0.5% IN NEBU: 2.5000 mg | INHALATION_SOLUTION | Freq: Once | RESPIRATORY_TRACT | Status: AC

## 2012-12-06 MED ORDER — ALBUTEROL SULFATE (5 MG/ML) 0.5% IN NEBU
INHALATION_SOLUTION | RESPIRATORY_TRACT | Status: AC
  Administered 2012-12-06: 2.5 mg via RESPIRATORY_TRACT
  Filled 2012-12-06: qty 0.5

## 2012-12-06 MED ORDER — FUROSEMIDE 10 MG/ML IJ SOLN
INTRAMUSCULAR | Status: AC
  Filled 2012-12-06: qty 8

## 2012-12-06 NOTE — Progress Notes (Signed)
PULMONARY  / CRITICAL CARE MEDICINE  Name: Roberto [name] MRN: [id number] DOB: 06/23/1935 PCP Jeoffrey Massed, MD Pulm: Dr [name] Cards: Dr Stuckey/Dr Excell Seltzer [facility] : Dr Early    ADMISSION DATE:  12/04/2012 CONSULTATION DATE:  12/04/12  REFERRING MD :  Danie Binder, MD PRIMARY SERVICE: [facility]  CHIEF COMPLAINT:  Post op TURP, acute on chronic respiratory failure.    BRIEF   77 years old male with PMH relevant for ischemic cardiomyopathy,  COPD (sees Dr [name]) , Thoracic Aneurysm (large and inoperable) and  AAA (large and inperopable, given 1 year prognosis at [facility] 18 months ago) A. Fib on coumadin, AAA,  post CABG, post AICD, last echo with LVEF of 35%. He also has COPD with chronic respiratory failure, CKD, DM.  Presented electively for TURP 12/04/2012  morning. At about 2 pm he became SOB and hypoxic, rapid response was called and the patient was transferred to the SD unit under the Hospitalist service. Critical care consult called to assist with management. At the time of PCCM exam the patient is awake, alert, oriented x 3, with BP of 108/70, HR in the 50's and saturating 100% on 50% FiO2. His RR is 20 to 24/min. Of note, his rectal temperature was 92 F. He complains ob dull back pain likely from being in bed. He does have SOB but is improved compared to a couple of hours ago and states that he is feeling better. PCCM consulted 12/04/12 on admit date  LINES / TUBES: - Foley catheter - Peripheral IV's  CULTURES: Results for orders placed during the hospital encounter of 12/04/12  MRSA PCR SCREENING     Status: None   Collection Time    12/04/12  4:00 PM      Result Value Range Status   MRSA by PCR NEGATIVE  NEGATIVE Final   Comment:            The GeneXpert MRSA Assay (FDA     approved for NASAL specimens     only), is one component of a     comprehensive MRSA colonization     surveillance program. It is not     intended to diagnose MRSA     infection nor to guide or      monitor treatment for     MRSA infections.    ANTIBIOTICS: Anti-infectives   Start     Dose/Rate Route Frequency Ordered Stop   12/05/12 2230  cefTRIAXone (ROCEPHIN) 1 g in dextrose 5 % 50 mL IVPB     1 g 100 mL/hr over 30 Minutes Intravenous Daily at bedtime 12/05/12 2226     12/04/12 2000  ciprofloxacin (CIPRO) tablet 500 mg  Status:  Discontinued     500 mg Oral 2 times daily 12/04/12 1135 12/05/12 2224   12/04/12 0548  ceFAZolin (ANCEF) IVPB 2 g/50 mL premix  Status:  Discontinued     2 g 100 mL/hr over 30 Minutes Intravenous 30 min pre-op 12/04/12 0548 12/04/12 1134   12/04/12 0000  cephALEXin (KEFLEX) 500 MG capsule     500 mg Oral Daily 12/04/12 0947         SIGNIFICANT EVENTS / STUDIES:   - S/ p transurethral resection of prostate 12/04/12 by Mena Goes, admitted to ICU for close monitoring and acute COPD exacerbation. Also continuous bladder irrigation which required hand irrigation and catheter exchange late 7/25. Received 1upRBC 7/25.    12/04/12 Rapid response called and transferred to SD for hypoxemia and hypotension. -  12/05/12:  more dyspneic than baseline though sitting and talking on phone. Worsening bicarb and creat noted. ABG with metabolic acidosis - new onset.  BP okay. Was febrile last night and then low temp but now normal temp   SUBJECTIVE/OVERNIGHT/INTERVAL HX  12/06/12: Dyspne much better fter bicarb yesterday fo metabolic acidosis.  Temp is normal. New NSTEMI  +. Renal function and acidosis - improving.   Overnight had abdominal pain:  Small Extraperitoneal Bladder Rupture - very small extraperitoneal rupture noted by CT cystogram 7/26 on w/u of continued abdominal pain. No large volume intraabdominal fluid or fluid collections. Initial plan for conservative management with continued foley (x2-3 weeks, then repeat cystogram). Catheter draining quantitiatively w/o issues.   HGd dropped 6gm% and given  - 2U PRBC. No visible blee  CT also shows 6.5cm Infra-renal  AAA that despite prior grafitig is enlarged. Adjacent duodenal inflammation vmycotic aneurysm. Also increse in thoracic aneyrsum to 7.1cm    VITAL SIGNS: Temp:  [97.5 F (36.4 C)-98.4 F (36.9 C)] 98 F (36.7 C) (07/27 0845) Pulse Rate:  [76-137] 79 (07/27 1000) Resp:  [16-29] 19 (07/27 1000) BP: (104-158)/(49-89) 129/81 mmHg (07/27 1000) SpO2:  [92 %-100 %] 100 % (07/27 1000) HEMODYNAMICS:   VENTILATOR SETTINGS:   INTAKE / OUTPUT: Intake/Output     07/26 0701 - 07/27 0700 07/27 0701 - 07/28 0700   P.O. 480 240   I.V. (mL/kg) 1590 (22.3) 225 (3.2)   Blood 363    Other 36000    IV Piggyback 50    Total Intake(mL/kg) 38483 (540.5) 465 (6.5)   Urine (mL/kg/hr) 35245 (20.6) 5175 (16.4)   Total Output 40981 5175   Net +3238 -4710          PHYSICAL EXAMINATION: General: Looks better. Cachexia. Smiling. Sitting i chair. Watching golf. Feeling better Eyes: Anicteric sclerae. ENT: Oropharynx clear. Dry mucous membranes. No thrush Lymph: No cervical, supraclavicular, or axillary lymphadenopathy. Heart: Regular rhytm. No murmurs, rubs, or gallops appreciated. No bruits, equal pulses. Lungs: Diminished breath sounds bilaterally, without wheezes . Normal upper airway sounds without evidence of stridor.  Abdomen: Abdomen soft, non-tender and not distended, normoactive bowel sounds. No hepatosplenomegaly or masses. Musculoskeletal: No clubbing or synovitis. Skin: No rashes or lesions Neuro: No focal neurologic deficits. Awake and alert.  LABS: PULMONARY  Recent Labs Lab 12/04/12 1502 12/05/12 0900 12/05/12 1500  PHART 7.442 7.203* 7.346*  PCO2ART 27.2* 28.8* 26.7*  PO2ART 268.0* 104.0* 114.0*  HCO3 18.3* 10.9* 14.2*  TCO2 17.4 10.8 13.7  O2SAT 98.6 95.8 98.2    CBC  Recent Labs Lab 12/05/12 0155 12/06/12 0214 12/06/12 1017  HGB 8.4* 6.2* 8.7*  HCT 26.8* 18.8* 26.8*  WBC 13.9* 12.4* 13.2*  PLT 123* SPECIMEN CHECKED FOR CLOTS 107*    COAGULATION  Recent  Labs Lab 12/01/12 1540 12/04/12 0630  INR 2.10* 1.34    CARDIAC    Recent Labs Lab 12/05/12 0833 12/05/12 1425 12/05/12 2030 12/06/12 0200 12/06/12 1017  TROPONINI <0.30 <0.30 0.60* 1.76* 5.56*    Recent Labs Lab 12/05/12 0910  PROBNP 695.8*     CHEMISTRY  Recent Labs Lab 12/05/12 0155 12/05/12 0731 12/05/12 1425 12/06/12 0214 12/06/12 1020  NA 137 139 135 138 141  K 6.3* 5.8* 4.2 3.7 3.4*  CL 109 110 104 104 102  CO2 16* 16* 17* 24 29  GLUCOSE 161* 189* 190* 148* 177*  BUN 29* 31* 32* 31* 28*  CREATININE 2.15* 2.28* 2.26* 2.01* 1.74*  CALCIUM 7.7* 8.0*  7.8* 8.0* 8.5    LIVER  Recent Labs Lab 12/01/12 1540 12/04/12 0630 12/05/12 0155 12/06/12 0214  AST  --   --  214* 145*  ALT  --   --  228* 175*  ALKPHOS  --   --  53 42  BILITOT  --   --  0.3 0.1*  PROT  --   --  5.3* 4.9*  ALBUMIN  --   --  2.6* 2.4*  INR 2.10* 1.34  --   --      INFECTIOUS  Recent Labs Lab 12/04/12 1520 12/05/12 0909 12/05/12 0910 12/06/12 0200  LATICACIDVEN 5.6* 6.0*  --   --   PROCALCITON  --   --  5.59 3.63     ENDOCRINE CBG (last 3)   Recent Labs  12/05/12 2009 12/06/12 0347 12/06/12 0753  GLUCAP 137* 142* 175*         IMAGING x48h  Ct Abdomen Pelvis Wo Contrast  12/05/2012   *RADIOLOGY REPORT*  Clinical Data: Abdominal pain status post TURP  CT ABDOMEN AND PELVIS WITHOUT CONTRAST  Technique:  Multidetector CT imaging of the abdomen and pelvis was performed following the standard protocol without intravenous contrast.  Comparison: 10/01/2011  Findings: Small bilateral pleural effusions, right greater than left.  Associated lower lobe opacities, likely atelectasis.  6.7 x 7.1 cm lower thoracic aortic aneurysm (series 2/image 11), previously 6.5 x 6.8 cm.  1.5 cm fluid density lesion in the inferior right hepatic lobe (series 2/image 32), stable versus minimally increased, benign.  Unenhanced spleen, pancreas, and adrenal glands are within normal  limits.  Gallbladder is unremarkable.  No intrahepatic or extrahepatic ductal dilatation.  Suspected left lower pole renal sinus cyst when correlating with the prior study (series 2/image 31).  Kidneys are otherwise within normal limits, noting bilateral renal vascular calcifications.  No hydronephrosis.  No evidence of bowel obstruction.  Normal appendix.  Colonic diverticulosis, including involving the descending colon, although without convincing associated inflammatory changes.  Prior sigmoid resection.  6.2 x 5.6 cm infrarenal aortic aneurysm, previously 4.8 x 4.5 cm, with aortobi-iliac grafting.  Adjacent inflammatory changes anteriorly/inferiorly are favored to be more closely related to the 3rd portion of the duodenum (series 2/image 37).  An inflammatory/mycotic aneurysm is difficult to exclude by imaging.  Small volume abdominopelvic ascites with presacral fluid.  No suspicious abdominopelvic lymphadenopathy.  Prostatomegaly with enlargement of the central gland.  A Foley catheter balloon is located within the TURP defect (series 2/image 75).  Contrast opacifies the bladder.  Mild extravasation of contrast along the right lateral aspect of the bladder (series 2/image 69), with additional gas along the anterior bladder dome (series 2/image 57).  Scattered foci of gas beneath the right anterior abdominal wall (series 2/53).  A few scattered foci of gas in the left pelvis (series 2/image 57).  These findings are most compatible with extraperitoneal bladder rupture.  Small fat-containing left inguinal hernia.  Visualized osseous structures are within normal limits.  IMPRESSION: Findings compatible with extraperitoneal bladder rupture, as described above.  Foley catheter balloon is located within the TURP defect.  6.2 x 5.6 cm infrarenal aortic aneurysm, increased, with associated aortobi-iliac stent.  Adjacent inflammatory changes are favored to be related to the duodenum, although an inflammatory/mycotic  aneurysm is not excluded by imaging.  6.7 x 7.1 cm lower thoracic aortic aneurysm, increased.  Additional ancillary findings as above.  These results were called by telephone on 12/05/2012 at approximately 2200 hours to Dr. Normand Sloop  Manny, who verbally acknowledged these results.   Original Report Authenticated By: Charline Bills, M.D.   Dg Chest Port 1 View  12/05/2012   *RADIOLOGY REPORT*  Clinical Data: Worsening shortness of breath.  Hypotension.  Renal insufficiency.  PORTABLE CHEST - 1 VIEW 12/05/2012 0851 hours:  Comparison: Portable chest x-ray yesterday.  Two-view chest x-ray 08/17/2012, 04/14/2012.  Findings: Prior sternotomy for CABG.  Cardiac silhouette upper normal in size to slightly enlarged but stable.  Left subclavian pacing defibrillator unchanged.  Suboptimal inspiration with atelectasis in the lung bases, right greater than left, new since yesterday.  Lungs otherwise clear.  IMPRESSION: Suboptimal inspiration accounts for bibasilar atelectasis, right greater than left.   Original Report Authenticated By: Hulan Saas, M.D.   Dg Chest Port 1 View  12/04/2012   *RADIOLOGY REPORT*  Clinical Data: Respiratory failure  PORTABLE CHEST - 1 VIEW  Comparison: 08/17/2012  Findings: Pacer / AICD.  Hyperinflation. Numerous leads and wires project over the chest.  Midline trachea.  Normal heart size.  No pleural effusion or pneumothorax.  Biapical pleural thickening. Lower thoracic aortic aneurysm, as detailed previously.  Scarring at the left lung base.  IMPRESSION: Hyperinflation/COPD, without acute superimposed process.  Descending thoracic aortic aneurysm, grossly similar.   Original Report Authenticated By: Jeronimo Greaves, M.D.    ASSESSMENT / PLAN:  PULMONARY A: 1) Acute on chronic respiratory failure due to copd with superimposed metabolic acidosis on 12/05/12  08/20/79: Improved with bicarb and down to  2L [place]  P:   -  pulmicort bid and duoneb q4h (alb neb wil help with high K)  - IV  steroids  - Incentive spirometry - BIcarg gtt for dyspnea related to acidosis; but will reduce dose - Continue supplemetal oxygen and titrate to goal O2 sat of 92%    CARDIAC  A:  1) Ischemic cardiomyopathy 2) Systolic CHF 3) Hypotension likely secondary to blood loss and volume depletion 4) Hypothermia and elevated WBC, concern for sepsis 5) A fib, rate controlled   12/06/12: NSR and normal BP but now with NSTEMI. Appears euvolemic  P:  - Cards consult to decide on meds (called by Dr Verta Ellen) - Continue amiodarone  VASCULAR A: Enlarging AAA and thoracic aneyrsm - both inoeprable and life expectancy limiting P - monitor - counseled and update him, wife an daudhgter  RENAL and GU A:   - CKD, Baseline crat 1.5 - s/p TURP7/25/14 complicated 12/05/12 by bladdre rupture (all day abd pain) and lactic acidosis  12/06/12: Improving abd pain and creat. Poor lactate clearance  P:   - reduce  bicarb gtt - recheck abg tomorrow with view towards dc of  Bicarb tomorrow - foley for 3 weeks per URology; appreciate urology consult - recheck lactate 12/07/12  GASTROINTESTINAL A:   1) constipated  P:   - senna docusate to cntinue  - try enema  HEMATOLOGIC A:   1) Acute blood loss anemia, last Hgb 8.9 after 1 unit of blood 12/04/12  12/04/12: No visible bleed but 2gM% hgb drop and given 2 U PRBC (suspect chrioic dz,critical illness and ? Some loss in peritoeum with bladdre reupture)  P:  - PRBC for hgb goal > 8gm% in view of NSTEMI   INFECTIOUS A:   1) Hypotension, hypothermia and elevated WBC. No obvious source of infection  12/06/12: Possible infetion ? source  P:   -abxc per flow shet  ENDOCRINE A:   1) DM P:   1) Novolog sliding scale  NEUROLOGIC A:  1) No issues, awake, alert, oriented x 3   GLOBAL 12/07/12: Change primary to PCCM wit ICU status due to several complications. Family hold Dr [name] in super high regard. They have bee updated extensively  about all of the above. Thy are still upset [place] [facility] [place] gae 1 year life expectancy some 12-18 months ago on account of AAA. They are aware of enlarging current AAA. Still full code. Too much info about acute medical events for them today; code status not adressed. They are already overwhelmed   The patient is critically ill with multiple organ systems failure and requires high complexity decision making for assessment and support, frequent evaluation and titration of therapies, application of advanced monitoring technologies and extensive interpretation of multiple databases.   Critical Care Time devoted to patient care services described in this note is  35  Minutes.  Dr. Kalman Shan, M.D., [facility].C.P Pulmonary and Critical Care Medicine Staff Physician [facility] Pager: [phone], If no answer or between  15:00h - 7:00h: call [phone]  12/06/2012 11:27 AM

## 2012-12-06 NOTE — Consult Note (Addendum)
Patient Name: Roberto [name]  MRN: [id number]  HPI: Roberto [name] is an 77 y.o. male referred for consultation by Dr.Murali Marchelle Gearing, MD for atrial fibrillation and acute myocardial infarction.  Mr. Vera has been followed by [facility] for the past 20 years. He has done well with moderately severe ischemic cardiomyopathy as well as extensive peripheral vascular disease. Consultation is now requested after a period of respiratory insufficiency following elective TURP. With appropriate intervention, the patient is now again compensated and free of significant cardiopulmonary symptoms.  He notes some residual left chest soreness/pressure that is constant, unrelated to respiration and unassociated with chest wall tenderness or any other symptoms.  Past Medical History  Diagnosis Date  . AAA (abdominal aortic aneurysm)     a. CTA 09/2011: 4.1-4.2cm infrarenal AAA.  Marland Kitchen Aneurysm of thoracic aorta 1995    a. Rupture 1995 w/spontaneous resolution;  b. CTA 09/2011: 6.8cm dist, 5.7cm mid  . Popliteal artery aneurysm   . Myocardial infarction   . Ischemic cardiomyopathy     10/2011 ECHO: EF 30-35%, apical akinesis  . Paroxysmal atrial fibrillation   . Hypertension   . Peripheral vascular disease   . Dyslipidemia   . Diabetes mellitus 2007    dx'd 2007 w/persisting FBS>125  . GERD (gastroesophageal reflux disease)   . Hiatal hernia   . Diverticular disease   . BPH (benign prostatic hypertrophy)     with hx of acute urinary retention (pt can self-cath)  . COPD (chronic obstructive pulmonary disease)     05/09/08 PFT FEV1 1.84 (55%), FVC 4.42 (95%), TLC 6.46 (92%), DLCO 72%, no BD response.  Overnight oximetry 10/2011 normal on RA.  Marland Kitchen Pneumonia 1/09  . Allergic rhinitis   . Shingles     X 2 episodes, both in left scapula area  . OSA (obstructive sleep apnea) 06/14/09    PSG RDI 17, PLMI 96, intolerant of CPAP or BPAP  . Chronic renal insufficiency     Baseline Cr 1.5  . Prostate cancer  07/2008    Low grade; watchful waiting with [name]  . Pulmonary nodule, left 03/02/2010    Left base; resolved on f/u CT  . Allergic rhinitis   . Carpal tunnel syndrome of left wrist 02/11/2011  . IMPLANTATION OF DEFIBRILLATOR, HX OF 09/02/2008    Medtronic 7232Cx Maximo V. Had a 6949 lead - failed and therapies turned off.  . IRON DEFICIENCY 06/08/2010    Hemoccult negative:  GI ([name]) recommended no invasive diagnostics--watchful waiting (2012).  . UNSPECIFIED ANEMIA 06/01/2010    Multifactorial: iron def, CRI, vit B12 def,   . Hypotension 04/14/2012  . CHF (congestive heart failure)   . Atrial fibrillation   . CAD (coronary artery disease)     post CABG with prior percutaneous intervention of the saphenous vein graft with known saphenous vein graft disease.  . Automatic implantable cardioverter-defibrillator in situ     turned off per office visit note of Dr Excell Seltzer   . Hypothyroidism   . Shortness of breath   . Arthritis     Past Surgical History  Procedure Laterality Date  . Cardiac defibrillator placement  08/28/2004    [name]  . Colon surgery  1995    partial colectomy  . Coronary artery bypass graft  1996  . Aorta - bilateral femoral artery bypass graft  1999    [name] Early  . Nose surgery    . Insertion of pacing lead  New rate sensing pacing lead with removal of a previous implanted ICD and insertion of a device back in the pocket with defibrillation threshold testing  . Abdominal aortic aneurysm repair  1999    Ingrarenal repair/ graft  . Stents      Placed to the saphenous vein graft  . Radiofrequency ablation  2006    A flutter  . Esophagogastroduodenoscopy (egd) with esophageal dilation  05/28/2012    Procedure: ESOPHAGOGASTRODUODENOSCOPY (EGD) WITH ESOPHAGEAL DILATION;  Surgeon: Rachael Fee, MD;  Location: [facility];  Service: Endoscopy;  Laterality: N/A;  . Coronary angioplasty    . Coronary stents       Family History   Problem Relation Age of Onset  . Stroke Mother 27  . Heart attack Mother     CVA  . Cancer Father 32    Lung  . Heart attack Father 71  . Lung cancer Father   . Coronary artery disease Other     2 of 5 siblings with CAD   Social History:  reports that he quit smoking about 9 years ago. His smoking use included Cigarettes. He has a 30 pack-year smoking history. He quit smokeless tobacco use about 9 years ago. His smokeless tobacco use included Chew. He reports that he drinks about 0.6 ounces of alcohol per week. He reports that he does not use illicit drugs.  Allergies:  Allergies  Allergen Reactions  . Atorvastatin     REACTION: muscle ache in legs  . Crestor (Rosuvastatin Calcium) Other (See Comments)    Lower ext fatigue and soreness- if taken everyday  . Lisinopril Diarrhea  . Morphine     REACTION: AMS - agitation  . Prednisone     REACTION: Can't breath  . Trazodone And Nefazodone Other (See Comments)    "drunk"  . Zolpidem Tartrate     REACTION: AMS   Medications:  I have reviewed the patient's current medications. Scheduled: . albuterol  2.5 mg Nebulization Q4H  . amiodarone  200 mg Oral q morning - 10a  . antiseptic oral rinse  15 mL Mouth Rinse q12n4p  . aspirin  81 mg Oral Daily  . bisacodyl  5 mg Oral Daily  . budesonide  0.25 mg Nebulization BID  . cefTRIAXone (ROCEPHIN)  IV  1 g Intravenous QHS  . chlorhexidine  15 mL Mouth Rinse BID  . [START ON 12/11/2012] cyanocobalamin  1,000 mcg Intramuscular Q30 days  . finasteride  5 mg Oral QHS  . furosemide      . insulin aspart  2-6 Units Subcutaneous Q4H  . ipratropium  0.5 mg Nebulization Q4H  . [START ON 12/07/2012] levothyroxine  125 mcg Oral Custom  . levothyroxine  62.5 mcg Oral Custom  . linagliptin  5 mg Oral Daily  . methylPREDNISolone (SOLU-MEDROL) injection  40 mg Intravenous Q12H  . metoprolol  5 mg Intravenous Q4H  . mirtazapine  15 mg Oral QHS  . pantoprazole  40 mg Oral Daily  . polyethylene  glycol  17 g Oral Daily  . senna-docusate  2 tablet Oral BID  . tamsulosin  0.8 mg Oral q morning - 10a   Total I&O since admission: -2.8 L, but weight has increased 3 kg since admission.  BLOOD GAS, ARTERIAL     Status: Abnormal   Collection Time    12/05/12  3:00 PM      Result Value Range   O2 Content 4.0     Delivery systems NASAL CANNULA  pH, Arterial 7.346 (*) 7.350 - 7.450   pCO2 arterial 26.7 (*) 35.0 - 45.0 mmHg   pO2, Arterial 114.0 (*) 80.0 - 100.0 mmHg   Bicarbonate 14.2 (*) 20.0 - 24.0 mEq/L   TCO2 13.7  0 - 100 mmol/L   Acid-base deficit 10.1 (*) 0.0 - 2.0 mmol/L   O2 Saturation 98.2     Patient temperature 98.6     Collection site LEFT RADIAL     Drawn by [id number]     Sample type ARTERIAL DRAW     Allens test (pass/fail) PASS  PASS  TROPONIN I     Status: Abnormal   Collection Time    12/06/12  2:00 AM      Result Value Range   Troponin I 1.76 (*) <0.30 ng/mL  COMPREHENSIVE METABOLIC PANEL     Status: Abnormal   Collection Time    12/06/12  2:14 AM      Result Value Range   Sodium 138  135 - 145 mEq/L   Potassium 3.7  3.5 - 5.1 mEq/L   Chloride 104  96 - 112 mEq/L   CO2 24  19 - 32 mEq/L   Glucose, Bld 148 (*) 70 - 99 mg/dL   BUN 31 (*) 6 - 23 mg/dL   Creatinine, Ser 2.95 (*) 0.50 - 1.35 mg/dL   Calcium 8.0 (*) 8.4 - 10.5 mg/dL   Total Protein 4.9 (*) 6.0 - 8.3 g/dL   Albumin 2.4 (*) 3.5 - 5.2 g/dL   AST 621 (*) 0 - 37 U/L   ALT 175 (*) 0 - 53 U/L   Alkaline Phosphatase 42  39 - 117 U/L   Total Bilirubin 0.1 (*) 0.3 - 1.2 mg/dL   GFR calc non Af Amer 31 (*) >90 mL/min   GFR calc Af Amer 36 (*) >90 mL/min  CBC     Status: Abnormal   Collection Time    12/06/12  2:14 AM      Result Value Range   WBC 12.4 (*) 4.0 - 10.5 K/uL   RBC 2.16 (*) 4.22 - 5.81 MIL/uL   Hemoglobin 6.2 (*) 13.0 - 17.0 g/dL   Comment: REPEATED TO VERIFY     CRITICAL RESULT CALLED TO, READ BACK BY AND VERIFIED WITH:     CALABRIGHT RN AT 0250 ON [id number] BY DLONG     DELTA  CHECK NOTED   HCT 18.8 (*) 39.0 - 52.0 %   MCV 87.0  78.0 - 100.0 fL   MCH 27.8  26.0 - 34.0 pg   MCHC 31.9  30.0 - 36.0 g/dL   RDW 84.6  96.2 - 95.2 %      Result Value Range   Troponin I 5.56 (*) <0.30 ng/mL  CBC     Status: Abnormal   Collection Time    12/06/12 10:17 AM      Result Value Range   WBC 13.2 (*) 4.0 - 10.5 K/uL   RBC 3.14 (*) 4.22 - 5.81 MIL/uL   Hemoglobin 8.7 (*) 13.0 - 17.0 g/dL   Comment: POST TRANSFUSION SPECIMEN   HCT 26.8 (*) 39.0 - 52.0 %   MCV 85.4  78.0 - 100.0 fL   MCH 27.7  26.0 - 34.0 pg   MCHC 32.5  30.0 - 36.0 g/dL   RDW 84.1 (*) 32.4 - 40.1 %   Platelets 107 (*) 150 - 400 K/uL  BASIC METABOLIC PANEL     Status: Abnormal   Collection Time  12/06/12 10:20 AM      Result Value Range   Sodium 141  135 - 145 mEq/L   Potassium 3.4 (*) 3.5 - 5.1 mEq/L   Chloride 102  96 - 112 mEq/L   CO2 29  19 - 32 mEq/L   Glucose, Bld 177 (*) 70 - 99 mg/dL   BUN 28 (*) 6 - 23 mg/dL   Creatinine, Ser 1.61 (*) 0.50 - 1.35 mg/dL   Calcium 8.5  8.4 - 09.6 mg/dL   GFR calc non Af Amer 37 (*) >90 mL/min   GFR calc Af Amer 43 (*) >90 mL/min   CT Abdomen and Pelvis  Associated lower lobe opacities, likely atelectasis.  6.7 x 7.1 cm lower thoracic aortic aneurysm (series 2/image 11), previously 6.5 x 6.8 cm.  Bilateral renal vascular calcifications.   Prior sigmoid resection.  6.2 x 5.6 cm infrarenal aortic aneurysm, previously 4.8 x 4.5 cm, with aortobi-iliac grafting.   Small volume abdominopelvic ascites with presacral fluid.    These findings are most compatible with extraperitoneal bladder rupture.   Findings compatible with extraperitoneal bladder rupture, as described above.  Foley catheter balloon is located within the TURP defect.  6.2 x 5.6 cm infrarenal aortic aneurysm, increased, with associated aortobi-iliac stent.  Adjacent inflammatory changes are favored to be related to the duodenum, although an inflammatory/mycotic aneurysm is not excluded by imaging.  6.7  x 7.1 cm lower thoracic aortic aneurysm, increased.    Dg Chest Port 1 View  12/05/2012   Prior sternotomy for CABG.  Cardiac silhouette upper normal in size to slightly enlarged but stable.  Left subclavian pacing defibrillator unchanged.  Suboptimal inspiration with atelectasis in the lung bases, right greater than left, new since yesterday.  Lungs otherwise clear.  IMPRESSION: Suboptimal inspiration accounts for bibasilar atelectasis, right greater than left.    EKG: 12/05/12  sinus rhythm; first-degree AV block; IVCD; low-voltage in limb leads.  Since previous tracing performed in 12/04/12, axis has shifted rightward and QRS interval has increased.  Review of Systems: General: no anorexia, weight gain or weight loss Cardiac: no chest pain, dyspnea, orthopnea, PND,  or syncope Respiratory: no cough, sputum production or hemoptysis GI: no nausea, abdominal pain, emesis, diarrhea or constipation Integument: no significant lesions Neurologic: No muscle weakness or paralysis; no speech disturbance; no headache All other systems reviewed and are negative.  Physical Exam: Blood pressure 112/60, pulse 81, temperature 98.1 F (36.7 C), temperature source Oral, resp. rate 17, height 5\' 11"  (1.803 m), weight 74.39 kg (164 lb), SpO2 96.00%.;  Body mass index is 22.88 kg/(m^2). General-Well-developed; no acute distress; mentally sharp HEENT-[place]/AT; PERRL; EOM intact; conjunctiva and lids nl; multiple dental extractions Neck-mild JVD; no carotid bruits Endocrine-No thyromegaly Lungs-few rhonchi and wheezes; resonant percussion; prolonged expiratory phase Cardiovascular- normal PMI; normal S1 and S2; grade 2/6 early systolic ejection murmur Abdomen-BS normal; soft and non-tender without masses or organomegaly; aortic pulsation not palpable Musculoskeletal-No deformities, cyanosis or clubbing Neurologic-Nl cranial nerves; symmetric strength and tone Skin- Warm, no significant lesions Extremities-Nl  distal pulses; no edema  Assessment/Plan:  Paroxysmal atrial fibrillation:  Current rhythm is normal sinus.  While patient has some risk of thromboembolism while not anticoagulated, this risk is small, especially in the absence of sustained atrial fibrillation. It appears that hematuria has cleared, and anticoagulation can be resumed.  Acute non-ST segment elevation myocardial infarction: Type II event with myocardial necrosis related to inadequate perfusion in the setting of severe physiologic stress.  No acute  EKG abnormalities other than IVCD, which could be rate related. No further cardiac testing or change in medical therapy is warranted.  Respiratory failure: Patient believes he has returned to baseline; appears to be more respiratory than cardiac event with only mild to moderate elevation in BNP level and no pulmonary edema on chest x-ray.   Acute on chronic renal failure: Transient increase in creatinine from baseline of approximately 1.5 to 2.6. Progressive improvement over the last few days, but not quite back to baseline. Likely the result of ischemia related to hypoperfusion.  Ischemic cardiomyopathy:  Weight has increased, but I&O is negative.  In the absence of symptoms or other evidence for congestive heart failure, defer reintroduction of diuretic to his medical regime.  [place] Bing, MD 12/06/2012, 5:26 PM

## 2012-12-06 NOTE — Progress Notes (Signed)
eLink Physician-Brief Progress Note Patient Name: Roberto [name] DOB: 11/08/1935 MRN: [id number]  Date of Service  12/06/2012   HPI/Events of Note  Some reported wheezing between units.  Has some renal insufficiency.   eICU Interventions  Plan: 60 mg lasix IV times one dose now   Intervention Category Intermediate Interventions: Respiratory distress - evaluation and management  DETERDING,ELIZABETH 12/06/2012, 6:58 AM

## 2012-12-06 NOTE — Progress Notes (Signed)
ANTICOAGULATION CONSULT NOTE - Initial Consult  Pharmacy Consult for warfarin Indication: h/o afib  Allergies  Allergen Reactions  . Atorvastatin     REACTION: muscle ache in legs  . Crestor (Rosuvastatin Calcium) Other (See Comments)    Lower ext fatigue and soreness- if taken everyday  . Lisinopril Diarrhea  . Morphine     REACTION: AMS - agitation  . Prednisone     REACTION: Can't breath  . Trazodone And Nefazodone Other (See Comments)    "drunk"  . Zolpidem Tartrate     REACTION: AMS    Patient Measurements: Height: 5\' 11"  (180.3 cm) Weight: 164 lb (74.39 kg) IBW/kg (Calculated) : 75.3 Heparin Dosing Weight:   Vital Signs: Temp: 98.1 F (36.7 C) (07/27 1600) Temp src: Oral (07/27 1600) BP: 112/60 mmHg (07/27 1632) Pulse Rate: 81 (07/27 1700)  Labs:  Recent Labs  12/04/12 0630  12/05/12 0155  12/05/12 1425 12/05/12 2030 12/06/12 0200 12/06/12 0214 12/06/12 1017 12/06/12 1020  HGB  --   < > 8.4*  --   --   --   --  6.2* 8.7*  --   HCT  --   < > 26.8*  --   --   --   --  18.8* 26.8*  --   PLT  --   < > 123*  --   --   --   --  SPECIMEN CHECKED FOR CLOTS 107*  --   LABPROT 16.3*  --   --   --   --   --   --   --   --   --   INR 1.34  --   --   --   --   --   --   --   --   --   CREATININE  --   < > 2.15*  < > 2.26*  --   --  2.01*  --  1.74*  TROPONINI  --   < >  --   < > <0.30 0.60* 1.76*  --  5.56*  --   < > = values in this interval not displayed.  Estimated Creatinine Clearance: 39.2 ml/min (by C-G formula based on Cr of 1.74).   Medical History: Past Medical History  Diagnosis Date  . AAA (abdominal aortic aneurysm)     a. CTA 09/2011: 4.1-4.2cm infrarenal AAA.  Marland Kitchen Aneurysm of thoracic aorta 1995    a. Rupture 1995 w/spontaneous resolution;  b. CTA 09/2011: 6.8cm dist, 5.7cm mid  . Popliteal artery aneurysm   . Myocardial infarction   . Ischemic cardiomyopathy     10/2011 ECHO: EF 30-35%, apical akinesis  . Paroxysmal atrial fibrillation   .  Hypertension   . Peripheral vascular disease   . Dyslipidemia   . Diabetes mellitus 2007    dx'd 2007 w/persisting FBS>125  . GERD (gastroesophageal reflux disease)   . Hiatal hernia   . Diverticular disease   . BPH (benign prostatic hypertrophy)     with hx of acute urinary retention (pt can self-cath)  . COPD (chronic obstructive pulmonary disease)     05/09/08 PFT FEV1 1.84 (55%), FVC 4.42 (95%), TLC 6.46 (92%), DLCO 72%, no BD response.  Overnight oximetry 10/2011 normal on RA.  Marland Kitchen Pneumonia 1/09  . Allergic rhinitis   . Shingles     X 2 episodes, both in left scapula area  . OSA (obstructive sleep apnea) 06/14/09    PSG RDI 17, PLMI 96, intolerant of  CPAP or BPAP  . Chronic renal insufficiency     Baseline Cr 1.5  . Prostate cancer 07/2008    Low grade; watchful waiting with [name]  . Pulmonary nodule, left 03/02/2010    Left base; resolved on f/u CT  . Allergic rhinitis   . Carpal tunnel syndrome of left wrist 02/11/2011  . IMPLANTATION OF DEFIBRILLATOR, HX OF 09/02/2008    Medtronic 7232Cx Maximo V. Had a 6949 lead - failed and therapies turned off.  . IRON DEFICIENCY 06/08/2010    Hemoccult negative:  GI ([name]) recommended no invasive diagnostics--watchful waiting (2012).  . UNSPECIFIED ANEMIA 06/01/2010    Multifactorial: iron def, CRI, vit B12 def,   . Hypotension 04/14/2012  . CHF (congestive heart failure)   . Atrial fibrillation   . CAD (coronary artery disease)     post CABG with prior percutaneous intervention of the saphenous vein graft with known saphenous vein graft disease.  . Automatic implantable cardioverter-defibrillator in situ     turned off per office visit note of Dr Excell Seltzer   . Hypothyroidism   . Shortness of breath   . Arthritis     Assessment: 73 YOM s/p TURP 7/28 for prostatic hyperplasia, he was on warfarin prior to surgery for h/o afib.  Cardiology's note states hematuria resolved and will resume warfarin tonight.    Home dosing  (per coumadin clinic visit 2.5mg  daily except 5mg  on Mon  Last INR was 1.34 preoperatively  CBC: Hgb = 8.7, platelets = 107  Goal of Therapy:  INR 2-3 Monitor platelets by anticoagulation protocol: Yes   Plan:   Due to recent surgery, will start with coumadin 2.5mg  tonight (his normal home dose) and watch INR trend  Daily INR  Monitor for bleeding and CBC  Juliette Alcide, PharmD, BCPS.   Pager: [phone]  12/06/2012,6:10 PM

## 2012-12-06 NOTE — Progress Notes (Signed)
2 Days Post-Op  Subjective:  1 - Prostatic Hyperplasia With Recurrent Hematuria - s/p transurethral resection of prostate 12/04/12 by Mena Goes, admitted to ICU for close monitoring and acute COPD exacerbation. Also continuous bladder irrigation which required hand irrigation and catheter exchange late 7/25.  Received 1upRBC 7/25 and 2 additional 7/27.  2 - Renal Insufficiency - Baseline Cr 1.5-2 range in past year.   3 - Small Extraperitoneal Bladder Rupture - very small extraperitoneal rupture noted by CT cystogram 7/26 on w/u of continued abdominal pain. No large volume intraabdominal fluid or fluid collections. Initial plan for conservative management with continued foley (x2-3 weeks, then repeat cystogram). Catheter draining quantitiatively w/o issues.  Today Roberto [name] is feeling stronger. Mush less dyspnea and wants to eat regular food. Hgb drop to 6 noted.  Objective: Vital signs in last 24 hours: Temp:  [97.5 F (36.4 C)-98.4 F (36.9 C)] 97.6 F (36.4 C) (07/27 0727) Pulse Rate:  [67-137] 85 (07/27 0727) Resp:  [16-29] 22 (07/27 0727) BP: (104-158)/(49-89) 126/72 mmHg (07/27 0727) SpO2:  [92 %-100 %] 100 % (07/27 0600) Last BM Date: 12/02/12  Intake/Output from previous day: 07/26 0701 - 07/27 0700 In: 16109 [P.O.:480; I.V.:1590; Blood:363; IV Piggyback:50] Out: 35245 [Urine:35245] Intake/Output this shift: Total I/O In: -  Out: 3075 [Urine:3075]  General appearance: alert, cooperative, appears stated age and Appears more vigorous today. Head: Normocephalic, without obvious abnormality, atraumatic Eyes: conjunctivae/corneas clear. PERRL, EOM's intact. Fundi benign. Ears: normal TM's and external ear canals both ears Nose: Nares normal. Septum midline. Mucosa normal. No drainage or sinus tenderness. Throat: lips, mucosa, and tongue normal; teeth and gums normal Neck: no adenopathy, no carotid bruit, no JVD, supple, symmetrical, trachea midline and thyroid not enlarged,  symmetric, no tenderness/mass/nodules Back: symmetric, no curvature. ROM normal. No CVA tenderness. Resp: clear to auscultation bilaterally Cardio: regular rate and rhythm, S1, S2 normal, no murmur, click, rub or gallop GI: soft, non-tender; bowel sounds normal; no masses,  no organomegaly Male genitalia: normal, 27F 3 way catheter in place wtih nearly clear efflux at 1gtt per second irrigation. No clots. Extremities: extremities normal, atraumatic, no cyanosis or edema Pulses: 2+ and symmetric Skin: Skin color, texture, turgor normal. No rashes or lesions Lymph nodes: Cervical, supraclavicular, and axillary nodes normal. Neurologic: Grossly normal  Lab Results:   Recent Labs  12/05/12 0155 12/06/12 0214  WBC 13.9* 12.4*  HGB 8.4* 6.2*  HCT 26.8* 18.8*  PLT 123* SPECIMEN CHECKED FOR CLOTS   BMET  Recent Labs  12/05/12 1425 12/06/12 0214  NA 135 138  K 4.2 3.7  CL 104 104  CO2 17* 24  GLUCOSE 190* 148*  BUN 32* 31*  CREATININE 2.26* 2.01*  CALCIUM 7.8* 8.0*   PT/INR  Recent Labs  12/04/12 0630  LABPROT 16.3*  INR 1.34   ABG  Recent Labs  12/05/12 0900 12/05/12 1500  PHART 7.203* 7.346*  HCO3 10.9* 14.2*    Studies/Results: Ct Abdomen Pelvis Wo Contrast  12/05/2012   *RADIOLOGY REPORT*  Clinical Data: Abdominal pain status post TURP  CT ABDOMEN AND PELVIS WITHOUT CONTRAST  Technique:  Multidetector CT imaging of the abdomen and pelvis was performed following the standard protocol without intravenous contrast.  Comparison: 10/01/2011  Findings: Small bilateral pleural effusions, right greater than left.  Associated lower lobe opacities, likely atelectasis.  6.7 x 7.1 cm lower thoracic aortic aneurysm (series 2/image 11), previously 6.5 x 6.8 cm.  1.5 cm fluid density lesion in the inferior right hepatic lobe (series  2/image 32), stable versus minimally increased, benign.  Unenhanced spleen, pancreas, and adrenal glands are within normal limits.  Gallbladder is  unremarkable.  No intrahepatic or extrahepatic ductal dilatation.  Suspected left lower pole renal sinus cyst when correlating with the prior study (series 2/image 31).  Kidneys are otherwise within normal limits, noting bilateral renal vascular calcifications.  No hydronephrosis.  No evidence of bowel obstruction.  Normal appendix.  Colonic diverticulosis, including involving the descending colon, although without convincing associated inflammatory changes.  Prior sigmoid resection.  6.2 x 5.6 cm infrarenal aortic aneurysm, previously 4.8 x 4.5 cm, with aortobi-iliac grafting.  Adjacent inflammatory changes anteriorly/inferiorly are favored to be more closely related to the 3rd portion of the duodenum (series 2/image 37).  An inflammatory/mycotic aneurysm is difficult to exclude by imaging.  Small volume abdominopelvic ascites with presacral fluid.  No suspicious abdominopelvic lymphadenopathy.  Prostatomegaly with enlargement of the central gland.  A Foley catheter balloon is located within the TURP defect (series 2/image 75).  Contrast opacifies the bladder.  Mild extravasation of contrast along the right lateral aspect of the bladder (series 2/image 69), with additional gas along the anterior bladder dome (series 2/image 57).  Scattered foci of gas beneath the right anterior abdominal wall (series 2/53).  A few scattered foci of gas in the left pelvis (series 2/image 57).  These findings are most compatible with extraperitoneal bladder rupture.  Small fat-containing left inguinal hernia.  Visualized osseous structures are within normal limits.  IMPRESSION: Findings compatible with extraperitoneal bladder rupture, as described above.  Foley catheter balloon is located within the TURP defect.  6.2 x 5.6 cm infrarenal aortic aneurysm, increased, with associated aortobi-iliac stent.  Adjacent inflammatory changes are favored to be related to the duodenum, although an inflammatory/mycotic aneurysm is not excluded by  imaging.  6.7 x 7.1 cm lower thoracic aortic aneurysm, increased.  Additional ancillary findings as above.  These results were called by telephone on 12/05/2012 at approximately 2200 hours to Dr. Sebastian Ache, who verbally acknowledged these results.   Original Report Authenticated By: Charline Bills, M.D.   Dg Chest Port 1 View  12/05/2012   *RADIOLOGY REPORT*  Clinical Data: Worsening shortness of breath.  Hypotension.  Renal insufficiency.  PORTABLE CHEST - 1 VIEW 12/05/2012 0851 hours:  Comparison: Portable chest x-ray yesterday.  Two-view chest x-ray 08/17/2012, 04/14/2012.  Findings: Prior sternotomy for CABG.  Cardiac silhouette upper normal in size to slightly enlarged but stable.  Left subclavian pacing defibrillator unchanged.  Suboptimal inspiration with atelectasis in the lung bases, right greater than left, new since yesterday.  Lungs otherwise clear.  IMPRESSION: Suboptimal inspiration accounts for bibasilar atelectasis, right greater than left.   Original Report Authenticated By: Hulan Saas, M.D.   Dg Chest Port 1 View  12/04/2012   *RADIOLOGY REPORT*  Clinical Data: Respiratory failure  PORTABLE CHEST - 1 VIEW  Comparison: 08/17/2012  Findings: Pacer / AICD.  Hyperinflation. Numerous leads and wires project over the chest.  Midline trachea.  Normal heart size.  No pleural effusion or pneumothorax.  Biapical pleural thickening. Lower thoracic aortic aneurysm, as detailed previously.  Scarring at the left lung base.  IMPRESSION: Hyperinflation/COPD, without acute superimposed process.  Descending thoracic aortic aneurysm, grossly similar.   Original Report Authenticated By: Jeronimo Greaves, M.D.    Anti-infectives: Anti-infectives   Start     Dose/Rate Route Frequency Ordered Stop   12/05/12 2230  cefTRIAXone (ROCEPHIN) 1 g in dextrose 5 % 50 mL IVPB  1 g 100 mL/hr over 30 Minutes Intravenous Daily at bedtime 12/05/12 2226     12/04/12 2000  ciprofloxacin (CIPRO) tablet 500 mg   Status:  Discontinued     500 mg Oral 2 times daily 12/04/12 1135 12/05/12 2224   12/04/12 0548  ceFAZolin (ANCEF) IVPB 2 g/50 mL premix  Status:  Discontinued     2 g 100 mL/hr over 30 Minutes Intravenous 30 min pre-op 12/04/12 0548 12/04/12 1134   12/04/12 0000  cephALEXin (KEFLEX) 500 MG capsule     500 mg Oral Daily 12/04/12 0947        Assessment/Plan:  1 - Prostatic Hyperplasia With Recurrent Hematuria - Improving POD2 with much less blood grossly. Continue CBI wean for now. hgb 6 likely dilutional as no s/s sig active bleeding.  Advance to reg diet.  2 - Renal Insufficiency - Likely multifacotrial with baseline dysfunction and likely some systemic absorption from small extraperitoneal leak.  3 - Small Extraperitoneal Bladder Rupture - Have reviewed imaging extensively with radiologist and discussed with Dr. Mena Goes as well. We agree that rupture is small, extraperitoneal, and best managed by catheter drainage. Nursing very aware of implications and have been instructed to contact MD immediately for any catheter problems.   4 - Greatly appreciate PCCM and hospitalist input.  [facility], Dhairya Corales 12/06/2012

## 2012-12-06 NOTE — Progress Notes (Signed)
Patient ID: Roberto [name], male   DOB: [birth year], 77 y.o.   MRN: [id number] [facility] PROGRESS NOTE  Roberto [name] WUJ:[id number] DOB: 1936-04-22 DOA: 12/04/2012 PCP: Jeoffrey Massed, MD  Brief narrative: Pt is 77 yo male with complicated and complex medical conditions including ischemic cardiomyopathy with EF 30 - 35% based on last 2 D ECHO 09/2012 (on Lasix at home), PAF on Aspirin, Coumadin, Amiodarone, COPD, CKD stage II - III with baseline Cr ~2 (per last BMP in 09/2012), BPH and urinary retention, CAD and s/p CABG, who has presented to [facility] for TUPR. He has tolerated procedure well earlier today 07/25 and after several hours upon arrival to the floor has suddenly decompensated with acute onset of dyspnea at rest, oxygen desaturation to low 80's, SBP in 80's. He was placed on NRB and has responded well. He explains he feels well at this time, denies chest pain or shortness of breath, no specific abdominal or urinary concerns, he denies cough, no fevers, chill, no LE swelling, no specific focal neurological symptoms.   Due to acute onset of hypoxic respiratory failure, pt required transfer to SDU, Dr. Mena Goes notified, PCCM notified as well.   Principal Problem:  Acute and chronic respiratory failure  - most likely multifactorial, secondary to COPD, acute blood loss anemia, ? CHF, compensation from metabolic acidosis - pt still reports dyspnea at rest - continue BD's scheduled and as needed, solumedrol IV  - bicarb drip, appreciate PCCM care COPD  - management as noted above  Active Problems:  Chest pain - likely ischemia form acute blood loss, Hg drop 8.4 --> 6.2 over 24 hours - 2 units of PRBC provided 12/05/2012 with appropriate increase in Hg - cardiology consulted for further recommendations Hypotension and Hypothermia  - resolved, reasonable BP control inpatient  - continue to monitor in SDU  ISCHEMIC CARDIOMYOPATHY  - last 2 D ECHO 30 -35% in May 2014, no signs of  volume overload on exam  - Lasix started due developing vascular congestion  - I do not see weights recorded in the chart so will ask nursing staff to obtain weight this AM and to check daily weights  - monitor strict I's and O's  Atrial fibrillation  - rate controlled on Metoprolol - will continue to hold Coumadin due to acute blood loss anemia  - pt is on Amiodarone and will ask cardiology to provide additional recommendations as this pt appears to be high risk bleeding Systolic CHF  - management as noted above, Lasix restarted  Acute on chronic renal failure  - CKD stage II, creatinine up from yesterday 1.59 --> 1.87 --> 2.15 --> 1.74 - strict I's and O's, BMP in AM Urinary retention  - status post TURP, post op day #2, per urology  Hyperkalemia  - status post 2 doses of kayexalaate and potassium low this AM - will supplement and repeat BMP in AM Leukocytosis  - likely secondary to steroids, WBC trending down  - CBC in AM  Acute blood loss anemia  - status post one unit of PRBC transfusion 12/04/2012  - close monitoring of Hg/Hct  Transaminitis  - unclear etiology, ? Shock liver, liver panel checked 07/2012 was within normal limits  - slowly trending down  DIABETES MELLITUS, TYPE II  - last A1C 07/2012 was < 6, repeat A1C 6.1 - continue SSI  Hypothyroidism  - TSH is within normal limits, continue current dose of Synthroid per home regimen  Constipation  - continue bowel regimen  HYPERTENSION  - continue to monitor in SDU  ABDOMINAL AORTIC ANEURYSM  PROSTATE CANCER   Consultants:  PCCM  Urology Procedures/Studies:  Dg Chest Port 1 View 12/04/2012 Hyperinflation/COPD, without acute superimposed process. Descending thoracic aortic aneurysm, grossly similar.  Ct Abdomen Pelvis Wo Contrast 12/05/2012   Findings compatible with extraperitoneal bladder rupture, as described above.  Foley catheter balloon is located within the TURP defect.  6.2 x 5.6 cm infrarenal aortic aneurysm,  increased, with associated aortobi-iliac stent.  Adjacent inflammatory changes are favored to be related to the duodenum, although an inflammatory/mycotic aneurysm is not excluded by imaging.  6.7 x 7.1 cm lower thoracic aortic aneurysm, increased.  Additional ancillary findings as above.   Dg Chest Port 1 View 12/05/2012   Suboptimal inspiration accounts for bibasilar atelectasis, right greater than left.   Dg Chest Port 1 View  12/04/2012   Hyperinflation/COPD, without acute superimposed process.  Descending thoracic aortic aneurysm, grossly similar.    Antibiotics:  Cipro 07/25 -->7/26 Rocephin 7/26 -->  Code Status: Full  Family Communication: Pt at bedside  Disposition Plan: Home when medically stable   HPI/Subjective: No events overnight.   Objective: Filed Vitals:   12/06/12 0845 12/06/12 0900 12/06/12 0950 12/06/12 1000  BP: 135/70   129/81  Pulse: 76 79  79  Temp: 98 F (36.7 C)     TempSrc: Oral     Resp: 22 22  19   Height:      Weight:      SpO2: 98% 98% 98% 100%    Intake/Output Summary (Last 24 hours) at 12/06/12 1111 Last data filed at 12/06/12 1000  Gross per 24 hour  Intake  57846 ml  Output  36420 ml  Net   -787 ml    Exam:   General:  Pt is alert, follows commands appropriately, not in acute distress  Cardiovascular: Regular rate and rhythm, S1/S2, no murmurs, no rubs, no gallops  Respiratory: Bibasilar crackles, diminished air movement bilaterally   Abdomen: Soft, tender in suprapubic area, non distended, bowel sounds present, no guarding  Extremities: No edema, pulses DP and PT palpable bilaterally  Neuro: Grossly nonfocal  Data Reviewed: Basic Metabolic Panel:  Recent Labs Lab 12/05/12 0155 12/05/12 0731 12/05/12 1425 12/06/12 0214 12/06/12 1020  NA 137 139 135 138 141  K 6.3* 5.8* 4.2 3.7 3.4*  CL 109 110 104 104 102  CO2 16* 16* 17* 24 29  GLUCOSE 161* 189* 190* 148* 177*  BUN 29* 31* 32* 31* 28*  CREATININE 2.15* 2.28* 2.26*  2.01* 1.74*  CALCIUM 7.7* 8.0* 7.8* 8.0* 8.5   Liver Function Tests:  Recent Labs Lab 12/05/12 0155 12/06/12 0214  AST 214* 145*  ALT 228* 175*  ALKPHOS 53 42  BILITOT 0.3 0.1*  PROT 5.3* 4.9*  ALBUMIN 2.6* 2.4*   CBC:  Recent Labs Lab 12/04/12 1520 12/04/12 2020 12/05/12 0155 12/06/12 0214 12/06/12 1017  WBC 10.7* 16.0* 13.9* 12.4* 13.2*  HGB 7.3* 8.9* 8.4* 6.2* 8.7*  HCT 23.8* 29.2* 26.8* 18.8* 26.8*  MCV 90.5 92.1 89.3 87.0 85.4  PLT 182 166 123* SPECIMEN CHECKED FOR CLOTS 107*   Cardiac Enzymes:  Recent Labs Lab 12/05/12 0833 12/05/12 1425 12/05/12 2030 12/06/12 0200 12/06/12 1017  TROPONINI <0.30 <0.30 0.60* 1.76* 5.56*   CBG:  Recent Labs Lab 12/05/12 1217 12/05/12 1607 12/05/12 2009 12/06/12 0347 12/06/12 0753  GLUCAP 176* 171* 137* 142* 175*    Recent Results (from the past 240 hour(s))  MRSA PCR  SCREENING     Status: None   Collection Time    12/04/12  4:00 PM      Result Value Range Status   MRSA by PCR NEGATIVE  NEGATIVE Final   Comment:            The GeneXpert MRSA Assay (FDA     approved for NASAL specimens     only), is one component of a     comprehensive MRSA colonization     surveillance program. It is not     intended to diagnose MRSA     infection nor to guide or     monitor treatment for     MRSA infections.     Scheduled Meds: . albuterol  2.5 mg Nebulization Q4H  . amiodarone  200 mg Oral q morning - 10a  . antiseptic oral rinse  15 mL Mouth Rinse q12n4p  . aspirin  300 mg Rectal Daily  . bisacodyl  5 mg Oral Daily  . budesonide  0.25 mg Nebulization BID  . cefTRIAXone (ROCEPHIN)  IV  1 g Intravenous QHS  . chlorhexidine  15 mL Mouth Rinse BID  . [START ON 12/11/2012] cyanocobalamin  1,000 mcg Intramuscular Q30 days  . finasteride  5 mg Oral QHS  . furosemide      . insulin aspart  2-6 Units Subcutaneous Q4H  . ipratropium  0.5 mg Nebulization Q4H  . [START ON 12/07/2012] levothyroxine  125 mcg Oral Custom  .  levothyroxine  62.5 mcg Oral Custom  . linagliptin  5 mg Oral Daily  . methylPREDNISolone (SOLU-MEDROL) injection  40 mg Intravenous Q12H  . metoprolol  5 mg Intravenous Q4H  . mirtazapine  15 mg Oral QHS  . pantoprazole  40 mg Oral Daily  . polyethylene glycol  17 g Oral Daily  . senna-docusate  2 tablet Oral BID  . tamsulosin  0.8 mg Oral q morning - 10a   Continuous Infusions: .  sodium bicarbonate  infusion 1000 mL 75 mL/hr at 12/06/12 0103  . sodium chloride irrigation      Debbora Presto, MD  [facility] Pager [phone]  If 7PM-7AM, please contact night-coverage [email or web address] Password [email or web address] 12/06/2012, 11:11 AM   LOS: 2 days

## 2012-12-06 NOTE — Progress Notes (Signed)
eLink Physician-Brief Progress Note Patient Name: Roberto [name] DOB: 1936-04-19 MRN: [id number]  Date of Service  12/06/2012   HPI/Events of Note  Serial hgb shows drop in Hgb from 8.4 to 6.2  No observed active bleeding.   eICU Interventions  Transfuse 2 units of pRBC Post-txf CBC and BMET   Intervention Category Intermediate Interventions: Bleeding - evaluation and treatment with blood products  DETERDING,ELIZABETH 12/06/2012, 2:58 AM

## 2012-12-06 NOTE — Progress Notes (Signed)
CRITICAL VALUE ALERT  Critical value received:  Troponin- 5.56  Date of notification:  12/06/12  Time of notification:  10:50  Critical value read back:yes  Nurse who received alert:  Haskell Flirt, RN  MD notified (1st page):  Ramaswamy  Time of first page:  11:10  MD notified (2nd page):  Time of second page:  Responding MD:  Marchelle Gearing  Time MD responded:  11:10

## 2012-12-07 ENCOUNTER — Encounter (HOSPITAL_COMMUNITY): Payer: Self-pay | Admitting: Urology

## 2012-12-07 LAB — GLUCOSE, CAPILLARY
Glucose-Capillary: 110 mg/dL — ABNORMAL HIGH (ref 70–99)
Glucose-Capillary: 137 mg/dL — ABNORMAL HIGH (ref 70–99)
Glucose-Capillary: 158 mg/dL — ABNORMAL HIGH (ref 70–99)
Glucose-Capillary: 159 mg/dL — ABNORMAL HIGH (ref 70–99)
Glucose-Capillary: 161 mg/dL — ABNORMAL HIGH (ref 70–99)

## 2012-12-07 LAB — BASIC METABOLIC PANEL
Calcium: 8.5 mg/dL (ref 8.4–10.5)
GFR calc non Af Amer: 45 mL/min — ABNORMAL LOW (ref >90)
Glucose, Bld: 168 mg/dL — ABNORMAL HIGH (ref 70–99)
Sodium: 139 mEq/L (ref 135–145)

## 2012-12-07 LAB — CBC WITH DIFFERENTIAL/PLATELET
Basophils Absolute: 0 10*3/uL (ref 0.0–0.1)
Basophils Relative: 0 % (ref 0–1)
Eosinophils Absolute: 0 10*3/uL (ref 0.0–0.7)
MCH: 27.8 pg (ref 26.0–34.0)
MCHC: 32.4 g/dL (ref 30.0–36.0)
Monocytes Relative: 7 % (ref 3–12)
Neutrophils Relative %: 91 % — ABNORMAL HIGH (ref 43–77)
Platelets: 99 10*3/uL — ABNORMAL LOW (ref 150–400)
RDW: 16.7 % — ABNORMAL HIGH (ref 11.5–15.5)

## 2012-12-07 LAB — BLOOD GAS, ARTERIAL
Acid-Base Excess: 3.4 mmol/L — ABNORMAL HIGH (ref 0.0–2.0)
Drawn by: 312881
O2 Saturation: 92.9 %
Patient temperature: 97.8
pCO2 arterial: 39.2 mmHg (ref 35.0–45.0)

## 2012-12-07 LAB — CBC
MCH: 27.7 pg (ref 26.0–34.0)
MCHC: 32.3 g/dL (ref 30.0–36.0)
Platelets: 94 10*3/uL — ABNORMAL LOW (ref 150–400)
RBC: 2.6 MIL/uL — ABNORMAL LOW (ref 4.22–5.81)

## 2012-12-07 LAB — LACTIC ACID, PLASMA: Lactic Acid, Venous: 2.6 mmol/L — ABNORMAL HIGH (ref 0.5–2.2)

## 2012-12-07 MED ORDER — FUROSEMIDE 10 MG/ML IJ SOLN
40.0000 mg | Freq: Once | INTRAMUSCULAR | Status: AC
  Administered 2012-12-07: 40 mg via INTRAVENOUS
  Filled 2012-12-07: qty 4

## 2012-12-07 MED ORDER — POTASSIUM CHLORIDE CRYS ER 20 MEQ PO TBCR
30.0000 meq | EXTENDED_RELEASE_TABLET | ORAL | Status: AC
  Administered 2012-12-07 (×2): 30 meq via ORAL
  Filled 2012-12-07 (×2): qty 1

## 2012-12-07 MED ORDER — CALCIUM CARBONATE ANTACID 500 MG PO CHEW
2.0000 | CHEWABLE_TABLET | Freq: Three times a day (TID) | ORAL | Status: DC | PRN
  Administered 2012-12-07: 400 mg via ORAL
  Filled 2012-12-07: qty 2

## 2012-12-07 MED ORDER — FLEET ENEMA 7-19 GM/118ML RE ENEM
1.0000 | ENEMA | Freq: Once | RECTAL | Status: AC
  Administered 2012-12-07: 1 via RECTAL
  Filled 2012-12-07: qty 1

## 2012-12-07 MED ORDER — SODIUM CHLORIDE 0.9 % IV SOLN
INTRAVENOUS | Status: DC
  Administered 2012-12-07: 500 mL via INTRAVENOUS

## 2012-12-07 MED ORDER — ENSURE COMPLETE PO LIQD
237.0000 mL | Freq: Two times a day (BID) | ORAL | Status: DC
  Administered 2012-12-08: 237 mL via ORAL

## 2012-12-07 NOTE — Progress Notes (Signed)
Patient ID: Roberto [name], male   DOB: [birth year], 77 y.o.   MRN: [id number]  Pt without complaints. Sleeping comfortably but easily arousable. He is getting a unit of blood.  Urine is clear  Abd soft, NT  A/P - will d/c CBI as his urine remains clear. Can irrigate prn. His wife is here and I discussed the extraperitoneal bladder rupture and the need for Foley catheter for a week or two longer than we planned.

## 2012-12-07 NOTE — Progress Notes (Signed)
Patient ID: Roberto [name], male   DOB: 12-19-1935, 77 y.o.   MRN: [id number]  Patient was short of breath when he woke up early this morning. He was a bit short of breath when he moved to the chair. He is sitting in a chair on Nasal cannula and more comfortable. Sats are 100% on the monitor. His head and chest pain. He has not had a bowel movement but is passing flatus.  Filed Vitals:   12/07/12 0600  BP: 140/79  Pulse: 86  Temp:   Resp: 20   Sat 100% on monitor PE: NAD Sitting in chair Abd - soft, NTTP, ND Ext - no CCE, no calf pain GU - CBI on 1 gtt / 2 secs and urine pink tinged but clear without clots.   CBC    Component Value Date/Time   WBC 11.6* 12/07/2012 0335   RBC 2.60* 12/07/2012 0335   HGB 7.2* 12/07/2012 0335   HCT 22.3* 12/07/2012 0335   PLT 94* 12/07/2012 0335   MCV 85.8 12/07/2012 0335   MCH 27.7 12/07/2012 0335   MCHC 32.3 12/07/2012 0335   RDW 16.6* 12/07/2012 0335   LYMPHSABS 0.9 08/05/2012 1518   MONOABS 0.5 08/05/2012 1518   EOSABS 0.1 08/05/2012 1518   BASOSABS 0.0 08/05/2012 1518    BMET    Component Value Date/Time   NA 139 12/07/2012 0335   K 3.1* 12/07/2012 0335   CL 102 12/07/2012 0335   CO2 29 12/07/2012 0335   GLUCOSE 168* 12/07/2012 0335   BUN 29* 12/07/2012 0335   CREATININE 1.48* 12/07/2012 0335   CREATININE 2.58* 09/25/2012 1610   CALCIUM 8.5 12/07/2012 0335   GFRNONAA 45* 12/07/2012 0335   GFRAA 52* 12/07/2012 0335       A/P - 1) Small extraperitoneal bladder rupture - Will continue foley x 2-3 weeks with post-op with cystogram in office  2) BPH associated with gross hematuria status post TURP -- s/p transurethral resection of prostate 12/04/12, admitted to ICU for close monitoring and acute COPD exacerbation. Also continuous bladder irrigation which required hand irrigation and catheter exchange late 7/25. Received 1upRBC 7/25 and 2 additional 7/27. Today, h/h down slightly but no significant hematuria. Catheter draining well, abd soft, NT. Dont  suspect significant GU bleeding continues. Coumadin restarted last night. If risk of thromboembolism is low, would rec to hold off on coumadin maybe until Wednesday. If he develops significant prostate bleeding again, it may be more difficult to irrigate bladder given the small bladder rupture. Five days of uninterrupted catheter drainage may allow the bladder to heal a significant amount.  Discussed with Dr. Marchelle Gearing and appreciate his excellent care.

## 2012-12-07 NOTE — Progress Notes (Signed)
PULMONARY  / CRITICAL CARE MEDICINE  Name: Roberto [name] MRN: [id number] DOB: 1936-02-12 PCP Jeoffrey Massed, MD Pulm: Dr [name] Cards: Dr Stuckey/Dr Excell Seltzer [facility] : Dr Early    ADMISSION DATE:  12/04/2012 CONSULTATION DATE:  12/04/12  REFERRING MD :  Danie Binder, MD PRIMARY SERVICE: [facility]  CHIEF COMPLAINT:  Post op TURP, acute on chronic respiratory failure.  BRIEF   77 years old male with PMH relevant for ischemic cardiomyopathy,  COPD (sees Dr [name]) , Thoracic Aneurysm (large and inoperable) and  AAA (large and inperopable, given 1 year prognosis at [facility] 18 months ago) A. Fib on coumadin, AAA,  post CABG, post AICD, last echo with LVEF of 35%. He also has COPD with chronic respiratory failure, CKD, DM.  Presented electively for TURP 12/04/2012  morning. At about 2 pm he became SOB and hypoxic, rapid response was called and the patient was transferred to the SD unit under the Hospitalist service. Critical care consult called to assist with management. At the time of PCCM exam the patient is awake, alert, oriented x 3, with BP of 108/70, HR in the 50's and saturating 100% on 50% FiO2. His RR is 20 to 24/min. Of note, his rectal temperature was 92 F. He complains ob dull back pain likely from being in bed. He does have SOB but is improved compared to a couple of hours ago and states that he is feeling better. PCCM consulted 12/04/12 on admit date  LINES / TUBES: - Foley catheter - Peripheral IV's  CULTURES: BCX2 7/25>>>  ANTIBIOTICS: cipro 7/25>>>7/26 CTX 7/26>>>  SIGNIFICANT EVENTS / STUDIES:   - S/ p transurethral resection of prostate 12/04/12 by Mena Goes, admitted to ICU for close monitoring and acute COPD exacerbation. Also continuous bladder irrigation which required hand irrigation and catheter exchange late 7/25. Received 1upRBC 7/25.    12/04/12 Rapid response called and transferred to SD for hypoxemia and hypotension. - 12/05/12:  more dyspneic than baseline though  sitting and talking on phone. Worsening bicarb and creat noted. ABG with metabolic acidosis - new onset.  BP okay. Was febrile last night and then low temp but now normal temp   SUBJECTIVE/OVERNIGHT/INTERVAL HX No c/o   VITAL SIGNS: Temp:  [97.6 F (36.4 C)-98.1 F (36.7 C)] 97.6 F (36.4 C) (07/28 0800) Pulse Rate:  [73-91] 86 (07/28 0600) Resp:  [16-25] 20 (07/28 0600) BP: (97-140)/(49-79) 140/79 mmHg (07/28 0600) SpO2:  [90 %-100 %] 100 % (07/28 0837) Weight:  [74.39 kg (164 lb)-75.07 kg (165 lb 8 oz)] 75.07 kg (165 lb 8 oz) (07/28 0400) 3 liters HEMODYNAMICS:   VENTILATOR SETTINGS:   INTAKE / OUTPUT: Intake/Output     07/27 0701 - 07/28 0700 07/28 0701 - 07/29 0700   P.O. 1010    I.V. (mL/kg) 577.8 (7.7)    Blood     Other     IV Piggyback 50    Total Intake(mL/kg) 1637.8 (21.8)    Urine (mL/kg/hr) 9750 (5.4)    Total Output 9750     Net -8112.3            PHYSICAL EXAMINATION: General: Looks better. Cachexia. Smiling. No distress ENT: Oropharynx clear. Dry mucous membranes. No thrush Lymph: No cervical, supraclavicular, or axillary lymphadenopathy. Heart: Regular rhytm. No murmurs, rubs, or gallops appreciated. No bruits, equal pulses. Lungs: Diminished breath sounds bilaterally, without wheezes  Abdomen: Abdomen soft, non-tender and not distended, normoactive bowel sounds. No hepatosplenomegaly or masses. Musculoskeletal: No clubbing or synovitis. Skin:  No rashes or lesions Neuro: No focal neurologic deficits. Awake and alert.  LABS: PULMONARY  Recent Labs Lab 12/04/12 1502 12/05/12 0900 12/05/12 1500 12/07/12 0334  PHART 7.442 7.203* 7.346* 7.454*  PCO2ART 27.2* 28.8* 26.7* 39.2  PO2ART 268.0* 104.0* 114.0* 74.3*  HCO3 18.3* 10.9* 14.2* 27.2*  TCO2 17.4 10.8 13.7 26.0  O2SAT 98.6 95.8 98.2 92.9  CBC  Recent Labs Lab 12/06/12 0214 12/06/12 1017 12/07/12 0335  HGB 6.2* 8.7* 7.2*  HCT 18.8* 26.8* 22.3*  WBC 12.4* 13.2* 11.6*  PLT SPECIMEN  CHECKED FOR CLOTS 107* 94*  COAGULATION  Recent Labs Lab 12/01/12 1540 12/04/12 0630 12/07/12 0335  INR 2.10* 1.34 1.14  CARDIAC  Recent Labs Lab 12/05/12 0833 12/05/12 1425 12/05/12 2030 12/06/12 0200 12/06/12 1017  TROPONINI <0.30 <0.30 0.60* 1.76* 5.56*    Recent Labs Lab 12/05/12 0910  PROBNP 695.8*  CHEMISTRY  Recent Labs Lab 12/05/12 0731 12/05/12 1425 12/06/12 0214 12/06/12 1020 12/07/12 0335  NA 139 135 138 141 139  K 5.8* 4.2 3.7 3.4* 3.1*  CL 110 104 104 102 102  CO2 16* 17* 24 29 29   GLUCOSE 189* 190* 148* 177* 168*  BUN 31* 32* 31* 28* 29*  CREATININE 2.28* 2.26* 2.01* 1.74* 1.48*  CALCIUM 8.0* 7.8* 8.0* 8.5 8.5  LIVER  Recent Labs Lab 12/01/12 1540 12/04/12 0630 12/05/12 0155 12/06/12 0214 12/07/12 0335  AST  --   --  214* 145*  --   ALT  --   --  228* 175*  --   ALKPHOS  --   --  53 42  --   BILITOT  --   --  0.3 0.1*  --   PROT  --   --  5.3* 4.9*  --   ALBUMIN  --   --  2.6* 2.4*  --   INR 2.10* 1.34  --   --  1.14  INFECTIOUS  Recent Labs Lab 12/04/12 1520 12/05/12 0909 12/05/12 0910 12/06/12 0200 12/07/12 0335  LATICACIDVEN 5.6* 6.0*  --   --  2.6*  PROCALCITON  --   --  5.59 3.63 1.76  ENDOCRINE CBG (last 3)   Recent Labs  12/06/12 2316 12/07/12 0340 12/07/12 0755  GLUCAP 179* 159* 148*  IMAGING x48h  Ct Abdomen Pelvis Wo Contrast  12/05/2012   *RADIOLOGY REPORT*  Clinical Data: Abdominal pain status post TURP  CT ABDOMEN AND PELVIS WITHOUT CONTRAST  Technique:  Multidetector CT imaging of the abdomen and pelvis was performed following the standard protocol without intravenous contrast.  Comparison: 10/01/2011  Findings: Small bilateral pleural effusions, right greater than left.  Associated lower lobe opacities, likely atelectasis.  6.7 x 7.1 cm lower thoracic aortic aneurysm (series 2/image 11), previously 6.5 x 6.8 cm.  1.5 cm fluid density lesion in the inferior right hepatic lobe (series 2/image 32), stable  versus minimally increased, benign.  Unenhanced spleen, pancreas, and adrenal glands are within normal limits.  Gallbladder is unremarkable.  No intrahepatic or extrahepatic ductal dilatation.  Suspected left lower pole renal sinus cyst when correlating with the prior study (series 2/image 31).  Kidneys are otherwise within normal limits, noting bilateral renal vascular calcifications.  No hydronephrosis.  No evidence of bowel obstruction.  Normal appendix.  Colonic diverticulosis, including involving the descending colon, although without convincing associated inflammatory changes.  Prior sigmoid resection.  6.2 x 5.6 cm infrarenal aortic aneurysm, previously 4.8 x 4.5 cm, with aortobi-iliac grafting.  Adjacent inflammatory changes anteriorly/inferiorly are favored to be  more closely related to the 3rd portion of the duodenum (series 2/image 37).  An inflammatory/mycotic aneurysm is difficult to exclude by imaging.  Small volume abdominopelvic ascites with presacral fluid.  No suspicious abdominopelvic lymphadenopathy.  Prostatomegaly with enlargement of the central gland.  A Foley catheter balloon is located within the TURP defect (series 2/image 75).  Contrast opacifies the bladder.  Mild extravasation of contrast along the right lateral aspect of the bladder (series 2/image 69), with additional gas along the anterior bladder dome (series 2/image 57).  Scattered foci of gas beneath the right anterior abdominal wall (series 2/53).  A few scattered foci of gas in the left pelvis (series 2/image 57).  These findings are most compatible with extraperitoneal bladder rupture.  Small fat-containing left inguinal hernia.  Visualized osseous structures are within normal limits.  IMPRESSION: Findings compatible with extraperitoneal bladder rupture, as described above.  Foley catheter balloon is located within the TURP defect.  6.2 x 5.6 cm infrarenal aortic aneurysm, increased, with associated aortobi-iliac stent.   Adjacent inflammatory changes are favored to be related to the duodenum, although an inflammatory/mycotic aneurysm is not excluded by imaging.  6.7 x 7.1 cm lower thoracic aortic aneurysm, increased.  Additional ancillary findings as above.  These results were called by telephone on 12/05/2012 at approximately 2200 hours to Dr. Sebastian Ache, who verbally acknowledged these results.   Original Report Authenticated By: Charline Bills, M.D.    ASSESSMENT / PLAN:  PULMONARY A: 1) Acute on chronic respiratory failure due to copd with superimposed metabolic acidosis on 12/05/12 2) possible AECOPD (doubt) 12/06/12: Improved with bicarb and down to  2L [place] P:   -  pulmicort bid and duoneb q4h (alb neb wil help with high K)  - IV steroids-->d/c - Incentive spirometry - Continue supplemetal oxygen and titrate to goal O2 sat of 92% -repeat CXR   CARDIAC  A:  1) Ischemic cardiomyopathy 2) Systolic CHF (Compensated) 3) Hypovolemic shock (due to acute post-op blood loss) 4) Hypothermia and elevated WBC, concern for sepsis 5) A fib, rate controlled  12/06/12: NSR and normal BP but now with NSTEMI. Appears euvolemic  P:  - Cards consult to decide on meds (called by Dr Verta Ellen) - Continue amiodarone  VASCULAR A:  Enlarging AAA and thoracic aneurysm, both in-operable,  and life expectancy limiting P - monitor - counseled pt and family  RENAL and GU A:   - CKD, Baseline creat 1.5 - s/p TURP7/25/14 complicated 12/05/12 by bladdre rupture (all day abd pain) and lactic acidosis. C/b small extraperitoneal bladder rupture  12/06/12: Improving abd pain and creat. Poor lactate clearance -hypokalemia  P:   - d/c bicarb -replace K _CBI per uro - foley for 3 weeks per Urology; appreciate urology consult  GASTROINTESTINAL A:   1) constipated P:   - senna docusate to cntinue  - try enema  HEMATOLOGIC A:   Acute blood loss anemia, last Hgb 8.9 after 1 unit of blood 12/04/12 12/04/12: No  visible bleed but 2gM% hgb drop and given 2 U PRBC (suspect chrioic dz,critical illness and ? Some loss in peritoeum with bladdre reupture) 7/28. hgb drift again > 1gm. ? AAA leaking?? P:  - PRBC for hgb goal > 8gm% in view of NSTEMI -hold coumadin for now  - hold asa for now  INFECTIOUS A:   Hypotension, hypothermia and elevated WBC. No obvious source of infection 12/06/12: Possible infection ? Source (? Translocation???)  P:   -abx per flow sheet  ENDOCRINE A:   1) DM P:   1) Novolog sliding scale  NEUROLOGIC A:   1) No issues, awake, alert, oriented x 3   GLOBAL 12/07/12: Change primary to PCCM wit ICU status due to several complications. Family hold Dr [name] in super high regard. They have bee updated extensively about all of the above. Thy are still upset with [facility] in [place] who gave 1 year life expectancy some 12-18 months ago on account of AAA. They are aware of enlarging current AAA. Still full code. Too much info about acute medical events for them today; code status not adressed. They are already overwhelmed 7/29: looks better but hgb still dropping. We have stopped asa and coumadin. Discussed risk/benefit w/ pt and wife due to active bleeding. Will keep in ICU another 24h.    12/07/2012 10:24 AM     STAFF NOTE: I, Dr Lavinia Sharps have personally reviewed patient's available data, including medical history, events of note, physical examination and test results as part of my evaluation. I have discussed with resident/NP and other care providers such as pharmacist, RN and RRT.  In addition,  I personally evaluated patient and elicited key findings of ongong hgb drop without overt bleeding. DDx isanemia of ICU v slow bleed intraperit from AAA or bladder rupture. Currently his long term life expectancy is limited by his AAA 7.5cm. So, will stop all anticoagulation and watch his hgb. Reportedly has advance directives and he is DNAR. He might be hospice appropriate and we will  approach this topic in due course  Rest per NP/medical resident whose note is outlined above and that I agree with .  Dr. Kalman Shan, M.D., [facility].C.P Pulmonary and Critical Care Medicine Staff Physician [facility] Pager: [phone], If no answer or between  15:00h - 7:00h: call [phone]  12/07/2012 1:18 PM

## 2012-12-07 NOTE — Progress Notes (Signed)
Patient Name: Roberto [name]  MRN: [id number]  HPI: Roberto [name] is an 77 y.o. male referred for consultation by Dr.Murali Marchelle Gearing, MD for atrial fibrillation and acute myocardial infarction.  [name] has been followed by [facility] for the past 20 years. He has done well with moderately severe ischemic cardiomyopathy as well as extensive peripheral vascular disease. Consultation is now requested after a period of respiratory insufficiency following elective TURP. With appropriate intervention, the patient is now again compensated and free of significant cardiopulmonary symptoms.  He notes some residual left chest soreness/pressure that is constant, unrelated to respiration and unassociated with chest wall tenderness or any other symptoms.  He had a good day yesterday but had some indigestion last night.  Was relieved with ginger ale and he also took a NTG.   Past Medical History  Diagnosis Date  . AAA (abdominal aortic aneurysm)     a. CTA 09/2011: 4.1-4.2cm infrarenal AAA.  Marland Kitchen Aneurysm of thoracic aorta 1995    a. Rupture 1995 w/spontaneous resolution;  b. CTA 09/2011: 6.8cm dist, 5.7cm mid  . Popliteal artery aneurysm   . Myocardial infarction   . Ischemic cardiomyopathy     10/2011 ECHO: EF 30-35%, apical akinesis  . Paroxysmal atrial fibrillation   . Hypertension   . Peripheral vascular disease   . Dyslipidemia   . Diabetes mellitus 2007    dx'd 2007 w/persisting FBS>125  . GERD (gastroesophageal reflux disease)   . Hiatal hernia   . Diverticular disease   . BPH (benign prostatic hypertrophy)     with hx of acute urinary retention (pt can self-cath)  . COPD (chronic obstructive pulmonary disease)     05/09/08 PFT FEV1 1.84 (55%), FVC 4.42 (95%), TLC 6.46 (92%), DLCO 72%, no BD response.  Overnight oximetry 10/2011 normal on RA.  Marland Kitchen Pneumonia 1/09  . Allergic rhinitis   . Shingles     X 2 episodes, both in left scapula area  . OSA (obstructive sleep apnea) 06/14/09   PSG RDI 17, PLMI 96, intolerant of CPAP or BPAP  . Chronic renal insufficiency     Baseline Cr 1.5  . Prostate cancer 07/2008    Low grade; watchful waiting with [name]  . Pulmonary nodule, left 03/02/2010    Left base; resolved on f/u CT  . Allergic rhinitis   . Carpal tunnel syndrome of left wrist 02/11/2011  . IMPLANTATION OF DEFIBRILLATOR, HX OF 09/02/2008    Medtronic 7232Cx Maximo V. Had a 6949 lead - failed and therapies turned off.  . IRON DEFICIENCY 06/08/2010    Hemoccult negative:  GI ([name]) recommended no invasive diagnostics--watchful waiting (2012).  . UNSPECIFIED ANEMIA 06/01/2010    Multifactorial: iron def, CRI, vit B12 def,   . Hypotension 04/14/2012  . CHF (congestive heart failure)   . Atrial fibrillation   . CAD (coronary artery disease)     post CABG with prior percutaneous intervention of the saphenous vein graft with known saphenous vein graft disease.  . Automatic implantable cardioverter-defibrillator in situ     turned off per office visit note of Dr Excell Seltzer   . Hypothyroidism   . Shortness of breath   . Arthritis     Past Surgical History  Procedure Laterality Date  . Cardiac defibrillator placement  08/28/2004    [name]  . Colon surgery  1995    partial colectomy  . Coronary artery bypass graft  1996  . Aorta - bilateral femoral artery  bypass graft  1999    [name] Early  . Nose surgery    . Insertion of pacing lead      New rate sensing pacing lead with removal of a previous implanted ICD and insertion of a device back in the pocket with defibrillation threshold testing  . Abdominal aortic aneurysm repair  1999    Ingrarenal repair/ graft  . Stents      Placed to the saphenous vein graft  . Radiofrequency ablation  2006    A flutter  . Esophagogastroduodenoscopy (egd) with esophageal dilation  05/28/2012    Procedure: ESOPHAGOGASTRODUODENOSCOPY (EGD) WITH ESOPHAGEAL DILATION;  Surgeon: Rachael Fee, MD;  Location: [facility]  ENDOSCOPY;  Service: Endoscopy;  Laterality: N/A;  . Coronary angioplasty    . Coronary stents       Family History  Problem Relation Age of Onset  . Stroke Mother 77  . Heart attack Mother     CVA  . Cancer Father 56    Lung  . Heart attack Father 44  . Lung cancer Father   . Coronary artery disease Other     2 of 5 siblings with CAD   Social History:  reports that he quit smoking about 9 years ago. His smoking use included Cigarettes. He has a 30 pack-year smoking history. He quit smokeless tobacco use about 9 years ago. His smokeless tobacco use included Chew. He reports that he drinks about 0.6 ounces of alcohol per week. He reports that he does not use illicit drugs.  Allergies:  Allergies  Allergen Reactions  . Atorvastatin     REACTION: muscle ache in legs  . Crestor (Rosuvastatin Calcium) Other (See Comments)    Lower ext fatigue and soreness- if taken everyday  . Dilaudid (Hydromorphone Hcl)     "drunk"  . Lisinopril Diarrhea  . Morphine     REACTION: AMS - agitation  . Prednisone     REACTION: Can't breath  . Trazodone And Nefazodone Other (See Comments)    "drunk"  . Zolpidem Tartrate     REACTION: AMS   Medications:  I have reviewed the patient's current medications. Scheduled: . albuterol  2.5 mg Nebulization Q4H  . amiodarone  200 mg Oral q morning - 10a  . antiseptic oral rinse  15 mL Mouth Rinse q12n4p  . aspirin  81 mg Oral Daily  . bisacodyl  5 mg Oral Daily  . budesonide  0.25 mg Nebulization BID  . cefTRIAXone (ROCEPHIN)  IV  1 g Intravenous QHS  . chlorhexidine  15 mL Mouth Rinse BID  . [START ON 12/11/2012] cyanocobalamin  1,000 mcg Intramuscular Q30 days  . finasteride  5 mg Oral QHS  . insulin aspart  2-6 Units Subcutaneous Q4H  . ipratropium  0.5 mg Nebulization Q4H  . levothyroxine  125 mcg Oral Custom  . levothyroxine  62.5 mcg Oral Custom  . linagliptin  5 mg Oral Daily  . methylPREDNISolone (SOLU-MEDROL) injection  40 mg Intravenous  Q12H  . metoprolol  5 mg Intravenous Q4H  . mirtazapine  15 mg Oral QHS  . pantoprazole  40 mg Oral Daily  . polyethylene glycol  17 g Oral Daily  . senna-docusate  2 tablet Oral BID  . tamsulosin  0.8 mg Oral q morning - 10a  . Warfarin - Pharmacist Dosing Inpatient   Does not apply q1800   Total I&O since admission: -2.8 L, but weight has increased 3 kg since admission.  BLOOD GAS,  ARTERIAL     Status: Abnormal   Collection Time    12/05/12  3:00 PM      Result Value Range   O2 Content 4.0     Delivery systems NASAL CANNULA     pH, Arterial 7.346 (*) 7.350 - 7.450   pCO2 arterial 26.7 (*) 35.0 - 45.0 mmHg   pO2, Arterial 114.0 (*) 80.0 - 100.0 mmHg   Bicarbonate 14.2 (*) 20.0 - 24.0 mEq/L   TCO2 13.7  0 - 100 mmol/L   Acid-base deficit 10.1 (*) 0.0 - 2.0 mmol/L   O2 Saturation 98.2     Patient temperature 98.6     Collection site LEFT RADIAL     Drawn by [id number]     Sample type ARTERIAL DRAW     Allens test (pass/fail) PASS  PASS  TROPONIN I     Status: Abnormal   Collection Time    12/06/12  2:00 AM      Result Value Range   Troponin I 1.76 (*) <0.30 ng/mL  COMPREHENSIVE METABOLIC PANEL     Status: Abnormal   Collection Time    12/06/12  2:14 AM      Result Value Range   Sodium 138  135 - 145 mEq/L   Potassium 3.7  3.5 - 5.1 mEq/L   Chloride 104  96 - 112 mEq/L   CO2 24  19 - 32 mEq/L   Glucose, Bld 148 (*) 70 - 99 mg/dL   BUN 31 (*) 6 - 23 mg/dL   Creatinine, Ser 0.45 (*) 0.50 - 1.35 mg/dL   Calcium 8.0 (*) 8.4 - 10.5 mg/dL   Total Protein 4.9 (*) 6.0 - 8.3 g/dL   Albumin 2.4 (*) 3.5 - 5.2 g/dL   AST 409 (*) 0 - 37 U/L   ALT 175 (*) 0 - 53 U/L   Alkaline Phosphatase 42  39 - 117 U/L   Total Bilirubin 0.1 (*) 0.3 - 1.2 mg/dL   GFR calc non Af Amer 31 (*) >90 mL/min   GFR calc Af Amer 36 (*) >90 mL/min  CBC     Status: Abnormal   Collection Time    12/06/12  2:14 AM      Result Value Range   WBC 12.4 (*) 4.0 - 10.5 K/uL   RBC 2.16 (*) 4.22 - 5.81 MIL/uL    Hemoglobin 6.2 (*) 13.0 - 17.0 g/dL   Comment: REPEATED TO VERIFY     CRITICAL RESULT CALLED TO, READ BACK BY AND VERIFIED WITH:     CALABRIGHT RN AT 0250 ON [id number] BY DLONG     DELTA CHECK NOTED   HCT 18.8 (*) 39.0 - 52.0 %   MCV 87.0  78.0 - 100.0 fL   MCH 27.8  26.0 - 34.0 pg   MCHC 31.9  30.0 - 36.0 g/dL   RDW 78.2  95.6 - 21.3 %      Result Value Range   Troponin I 5.56 (*) <0.30 ng/mL  CBC     Status: Abnormal   Collection Time    12/06/12 10:17 AM      Result Value Range   WBC 13.2 (*) 4.0 - 10.5 K/uL   RBC 3.14 (*) 4.22 - 5.81 MIL/uL   Hemoglobin 8.7 (*) 13.0 - 17.0 g/dL   Comment: POST TRANSFUSION SPECIMEN   HCT 26.8 (*) 39.0 - 52.0 %   MCV 85.4  78.0 - 100.0 fL   MCH 27.7  26.0 - 34.0  pg   MCHC 32.5  30.0 - 36.0 g/dL   RDW 16.1 (*) 09.6 - 04.5 %   Platelets 107 (*) 150 - 400 K/uL  BASIC METABOLIC PANEL     Status: Abnormal   Collection Time    12/06/12 10:20 AM      Result Value Range   Sodium 141  135 - 145 mEq/L   Potassium 3.4 (*) 3.5 - 5.1 mEq/L   Chloride 102  96 - 112 mEq/L   CO2 29  19 - 32 mEq/L   Glucose, Bld 177 (*) 70 - 99 mg/dL   BUN 28 (*) 6 - 23 mg/dL   Creatinine, Ser 4.09 (*) 0.50 - 1.35 mg/dL   Calcium 8.5  8.4 - 81.1 mg/dL   GFR calc non Af Amer 37 (*) >90 mL/min   GFR calc Af Amer 43 (*) >90 mL/min   CT Abdomen and Pelvis  Associated lower lobe opacities, likely atelectasis.  6.7 x 7.1 cm lower thoracic aortic aneurysm (series 2/image 11), previously 6.5 x 6.8 cm.  Bilateral renal vascular calcifications.   Prior sigmoid resection.  6.2 x 5.6 cm infrarenal aortic aneurysm, previously 4.8 x 4.5 cm, with aortobi-iliac grafting.   Small volume abdominopelvic ascites with presacral fluid.    These findings are most compatible with extraperitoneal bladder rupture.   Findings compatible with extraperitoneal bladder rupture, as described above.  Foley catheter balloon is located within the TURP defect.  6.2 x 5.6 cm infrarenal aortic aneurysm,  increased, with associated aortobi-iliac stent.  Adjacent inflammatory changes are favored to be related to the duodenum, although an inflammatory/mycotic aneurysm is not excluded by imaging.  6.7 x 7.1 cm lower thoracic aortic aneurysm, increased.    Dg Chest Port 1 View  12/05/2012   Prior sternotomy for CABG.  Cardiac silhouette upper normal in size to slightly enlarged but stable.  Left subclavian pacing defibrillator unchanged.  Suboptimal inspiration with atelectasis in the lung bases, right greater than left, new since yesterday.  Lungs otherwise clear.  IMPRESSION: Suboptimal inspiration accounts for bibasilar atelectasis, right greater than left.    EKG: 12/05/12  sinus rhythm; first-degree AV block; IVCD; low-voltage in limb leads.  Since previous tracing performed in 12/04/12, axis has shifted rightward and QRS interval has increased.  Physical Exam: Blood pressure 140/79, pulse 86, temperature 97.8 F (36.6 C), temperature source Oral, resp. rate 20, height 5\' 11"  (1.803 m), weight 165 lb 8 oz (75.07 kg), SpO2 90.00%.;  Body mass index is 23.09 kg/(m^2). General-Well-developed; no acute distress; mentally sharp HEENT-[place]/AT; PERRL; EOM intact; conjunctiva and lids nl; multiple dental extractions Neck-mild JVD; no carotid bruits Endocrine-No thyromegaly Lungs-few rhonchi and wheezes; resonant percussion; prolonged expiratory phase Cardiovascular- normal PMI; normal S1 and S2; grade 2/6 early systolic ejection murmur Abdomen-BS normal; soft and non-tender without masses or organomegaly; aortic pulsation not palpable Musculoskeletal-No deformities, cyanosis or clubbing Neurologic-Nl cranial nerves; symmetric strength and tone Skin- Warm, no significant lesions Extremities-Nl distal pulses; no edema  Assessment/Plan:  Paroxysmal atrial fibrillation:   Acute non-ST segment elevation myocardial infarction: Type II event with myocardial necrosis related to inadequate perfusion in the  setting of severe physiologic stress.  No acute EKG abnormalities other than IVCD, which could be rate related. His Troponin levels are 5.56.  Will repeat one today.  Will discuss with Dr. Excell Seltzer if he is in town about further evaluation.  At this point, he seems to be stable.  If he continues to have angina pain, we  will need to DC coumadin and arrange cath.  Given his multiple medical problems ( thoracic aortic aneurism, abdominal aortic aneurism, CKD) , I would favor medical therapy instead of cath.  Respiratory failure: Patient believes he has returned to baseline; appears to be more respiratory than cardiac event with only mild to moderate elevation in BNP level and no pulmonary edema on chest x-ray.  His procalcitonin level is elevated.    Acute on chronic renal failure:    Ischemic cardiomyopathy:     Elyn Aquas., MD 12/07/2012, 8:15 AM

## 2012-12-08 ENCOUNTER — Telehealth: Payer: Self-pay | Admitting: Family Medicine

## 2012-12-08 ENCOUNTER — Inpatient Hospital Stay (HOSPITAL_COMMUNITY): Payer: Medicare Other

## 2012-12-08 LAB — GLUCOSE, CAPILLARY
Glucose-Capillary: 172 mg/dL — ABNORMAL HIGH (ref 70–99)
Glucose-Capillary: 198 mg/dL — ABNORMAL HIGH (ref 70–99)

## 2012-12-08 LAB — TYPE AND SCREEN
ABO/RH(D): O POS
Unit division: 0

## 2012-12-08 LAB — CBC
HCT: 29 % — ABNORMAL LOW (ref 39.0–52.0)
MCHC: 31.7 g/dL (ref 30.0–36.0)
Platelets: 108 10*3/uL — ABNORMAL LOW (ref 150–400)
RDW: 17.4 % — ABNORMAL HIGH (ref 11.5–15.5)

## 2012-12-08 LAB — PROTIME-INR
INR: 1.09 (ref 0.00–1.49)
Prothrombin Time: 13.9 seconds (ref 11.6–15.2)

## 2012-12-08 LAB — COMPREHENSIVE METABOLIC PANEL
Albumin: 2.7 g/dL — ABNORMAL LOW (ref 3.5–5.2)
Alkaline Phosphatase: 61 U/L (ref 39–117)
BUN: 27 mg/dL — ABNORMAL HIGH (ref 6–23)
Potassium: 3.9 mEq/L (ref 3.5–5.1)
Total Protein: 5.4 g/dL — ABNORMAL LOW (ref 6.0–8.3)

## 2012-12-08 MED ORDER — ASPIRIN 81 MG PO CHEW
81.0000 mg | CHEWABLE_TABLET | Freq: Every day | ORAL | Status: DC
  Administered 2012-12-08 – 2012-12-10 (×3): 81 mg via ORAL
  Filled 2012-12-08 (×4): qty 1

## 2012-12-08 MED ORDER — MENTHOL 3 MG MT LOZG
1.0000 | LOZENGE | OROMUCOSAL | Status: DC | PRN
  Administered 2012-12-08 (×2): 3 mg via ORAL
  Filled 2012-12-08: qty 9

## 2012-12-08 MED ORDER — GLUCERNA SHAKE PO LIQD
237.0000 mL | Freq: Three times a day (TID) | ORAL | Status: DC
  Administered 2012-12-08 – 2012-12-10 (×5): 237 mL via ORAL
  Filled 2012-12-08 (×8): qty 237

## 2012-12-08 MED ORDER — FUROSEMIDE 10 MG/ML IJ SOLN
INTRAMUSCULAR | Status: AC
  Filled 2012-12-08: qty 4

## 2012-12-08 MED ORDER — METHYLPREDNISOLONE SODIUM SUCC 40 MG IJ SOLR
40.0000 mg | Freq: Every day | INTRAMUSCULAR | Status: DC
  Administered 2012-12-08 – 2012-12-09 (×2): 40 mg via INTRAVENOUS
  Filled 2012-12-08 (×2): qty 1

## 2012-12-08 MED ORDER — FUROSEMIDE 10 MG/ML IJ SOLN
20.0000 mg | Freq: Every day | INTRAMUSCULAR | Status: DC
  Administered 2012-12-08 – 2012-12-10 (×3): 20 mg via INTRAVENOUS
  Filled 2012-12-08 (×3): qty 2

## 2012-12-08 NOTE — Progress Notes (Signed)
Rocephin Consult  76 yom s/p TURP 7/25 found with bladder rupture 7/26 on Day#3 Rocephin empirically.  No obvious source of infection.  Patient is afebrile, WBC 12.3K, CrCl 48 ml/min.  Blood culture NGTD. PCT improving. Pt with h/o pseudomonas UTI in June 2014, Klebsiella pneumoniae UTI in 2012.  Per urology, will keep on Keflex at discharge.  Plan: Continue Rocephin 1gm IV q24h. Pharmacy will sign off from notes writting - will f/u peripherally.  Geoffry Paradise, PharmD, BCPS Pager: [phone] 1:19 PM Pharmacy #: [phone]

## 2012-12-08 NOTE — Telephone Encounter (Signed)
Patients wife aware and states as soon as he home and able to travel, she's bringing him to office.

## 2012-12-08 NOTE — Progress Notes (Signed)
CSW received call from patient's wife that she is at bedside and has Advance Directives with her. CSW made copy & placed in the front of patient's shadow chart. No other CSW needs identified - CSW signing off, please re-consult if needed.   Unice Bailey, LCSW [facility] Clinical Social Worker cell #: [phone]

## 2012-12-08 NOTE — Progress Notes (Signed)
PULMONARY  / CRITICAL CARE MEDICINE  Name: Roberto [name] MRN: [id number] DOB: [birth year] PCP Jeoffrey Massed, MD Pulm: Dr [name] Cards: Dr Stuckey/Dr Excell Seltzer [facility] : Dr Early    ADMISSION DATE:  12/04/2012 CONSULTATION DATE:  12/04/12  REFERRING MD :  Danie Binder, MD PRIMARY SERVICE: [facility]  CHIEF COMPLAINT:  Post op TURP, acute on chronic respiratory failure.  BRIEF   77 years old male with PMH relevant for ischemic cardiomyopathy,  COPD (sees Dr [name]) , Thoracic Aneurysm (large and inoperable) and  AAA (large and inperopable, given 1 year prognosis at [facility] 18 months ago) A. Fib on coumadin, AAA,  post CABG, post AICD, last echo with LVEF of 35%. He also has COPD with chronic respiratory failure, CKD, DM.  Presented electively for TURP 12/04/2012  morning. At about 2 pm he became SOB and hypoxic, rapid response was called and the patient was transferred to the SD unit under the Hospitalist service. Critical care consult called to assist with management. At the time of PCCM exam the patient is awake, alert, oriented x 3, with BP of 108/70, HR in the 50's and saturating 100% on 50% FiO2. His RR is 20 to 24/min. Of note, his rectal temperature was 92 F. He complains ob dull back pain likely from being in bed. He does have SOB but is improved compared to a couple of hours ago and states that he is feeling better. PCCM consulted 12/04/12 on admit date  LINES / TUBES: - Foley catheter - Peripheral IV's  CULTURES: BCX2 7/25>>>  ANTIBIOTICS: cipro 7/25>>>7/26 CTX 7/26>>>  SIGNIFICANT EVENTS / STUDIES:   - S/ p transurethral resection of prostate 12/04/12 by Mena Goes, admitted to ICU for close monitoring and acute COPD exacerbation. Also continuous bladder irrigation which required hand irrigation and catheter exchange late 7/25. Received 1upRBC 7/25.    12/04/12 Rapid response called and transferred to SD for hypoxemia and hypotension. - 12/05/12:  more dyspneic than baseline though  sitting and talking on phone. Worsening bicarb and creat noted. ABG with metabolic acidosis - new onset.  BP okay. Was febrile last night and then low temp but now normal temp  - 12/07/12: No events/ PRBC 1 unit. Aspirin and coumadin on hold. Contiuous bladder irrigation ended  SUBJECTIVE/OVERNIGHT/INTERVAL HX  12/08/12: No overt bleeding. Hgb stable. Troponin rising to 10; med mgmt per cards. Back and forth between sinus and A Fib. Moving around in room. He is upset that he is not at  Home; voicing that goal is both quality and quantity. Unable to discuss code status; has emotional wall  VITAL SIGNS: Temp:  [97.4 F (36.3 C)-99 F (37.2 C)] 98.4 F (36.9 C) (07/29 0800) Pulse Rate:  [77-89] 84 (07/29 0900) Resp:  [17-23] 23 (07/29 0900) BP: (111-149)/(55-99) 120/99 mmHg (07/29 0800) SpO2:  [97 %-100 %] 99 % (07/29 0900) FiO2 (%):  [95 %] 95 % (07/29 0909) Weight:  [71.668 kg (158 lb)] 71.668 kg (158 lb) (07/29 0500) 3 liters HEMODYNAMICS:   VENTILATOR SETTINGS: Vent Mode:  [-]  FiO2 (%):  [95 %] 95 % INTAKE / OUTPUT: Intake/Output     07/28 0701 - 07/29 0700 07/29 0701 - 07/30 0700   P.O. 980 120   I.V. (mL/kg) 210 (2.9) 20 (0.3)   Blood 312.5    Other 2750    IV Piggyback     Total Intake(mL/kg) 4252.5 (59.3) 140 (2)   Urine (mL/kg/hr) 7985 (4.6) 125 (0.6)   Stool 1 (0)  Total Output 7986 125   Net -3733.5 +15        Stool Occurrence 1 x      PHYSICAL EXAMINATION: General: Looks better. Cachexia. Smiling. No distress ENT: Oropharynx clear. Dry mucous membranes. No thrush Lymph: No cervical, supraclavicular, or axillary lymphadenopathy. Heart: Regular rhytm. No murmurs, rubs, or gallops appreciated. No bruits, equal pulses. Lungs: Diminished breath sounds bilaterally, without wheezes . SEEMS MORE TIGHT TODAY Abdomen: Abdomen soft, non-tender and not distended, normoactive bowel sounds. No hepatosplenomegaly or masses. Musculoskeletal: No clubbing or synovitis. Skin:  No rashes or lesions Neuro: No focal neurologic deficits. Awake and alert.  LABS: PULMONARY  Recent Labs Lab 12/04/12 1502 12/05/12 0900 12/05/12 1500 12/07/12 0334  PHART 7.442 7.203* 7.346* 7.454*  PCO2ART 27.2* 28.8* 26.7* 39.2  PO2ART 268.0* 104.0* 114.0* 74.3*  HCO3 18.3* 10.9* 14.2* 27.2*  TCO2 17.4 10.8 13.7 26.0  O2SAT 98.6 95.8 98.2 92.9  CBC  Recent Labs Lab 12/07/12 0335 12/07/12 1710 12/08/12 0340  HGB 7.2* 9.0* 9.2*  HCT 22.3* 27.8* 29.0*  WBC 11.6* 12.7* 12.3*  PLT 94* 99* 108*    COAGULATION  Recent Labs Lab 12/01/12 1540 12/04/12 0630 12/07/12 0335 12/08/12 0340  INR 2.10* 1.34 1.14 1.09    CARDIAC  Recent Labs Lab 12/05/12 1425 12/05/12 2030 12/06/12 0200 12/06/12 1017 12/08/12 0815  TROPONINI <0.30 0.60* 1.76* 5.56* 10.22*    Recent Labs Lab 12/05/12 0910  PROBNP 695.8*     CHEMISTRY  Recent Labs Lab 12/05/12 1425 12/06/12 0214 12/06/12 1020 12/07/12 0335 12/08/12 0340  NA 135 138 141 139 144  K 4.2 3.7 3.4* 3.1* 3.9  CL 104 104 102 102 105  CO2 17* 24 29 29  34*  GLUCOSE 190* 148* 177* 168* 120*  BUN 32* 31* 28* 29* 27*  CREATININE 2.26* 2.01* 1.74* 1.48* 1.36*  CALCIUM 7.8* 8.0* 8.5 8.5 8.9  LIVER  Recent Labs Lab 12/01/12 1540 12/04/12 0630 12/05/12 0155 12/06/12 0214 12/07/12 0335 12/08/12 0340  AST  --   --  214* 145*  --  77*  ALT  --   --  228* 175*  --  164*  ALKPHOS  --   --  53 42  --  61  BILITOT  --   --  0.3 0.1*  --  0.3  PROT  --   --  5.3* 4.9*  --  5.4*  ALBUMIN  --   --  2.6* 2.4*  --  2.7*  INR 2.10* 1.34  --   --  1.14 1.09  INFECTIOUS  Recent Labs Lab 12/04/12 1520 12/05/12 0909 12/05/12 0910 12/06/12 0200 12/07/12 0335  LATICACIDVEN 5.6* 6.0*  --   --  2.6*  PROCALCITON  --   --  5.59 3.63 1.76  ENDOCRINE CBG (last 3)   Recent Labs  12/07/12 2335 12/08/12 0315 12/08/12 0739  GLUCAP 110* 129* 105*  IMAGING x48h  Dg Chest Port 1 View  12/08/2012   *RADIOLOGY  REPORT*  Clinical Data: Pneumonia.  PORTABLE CHEST - 1 VIEW  Comparison: 12/05/2012.  Findings: Left subclavian AICD appears unchanged.  CABG/median sternotomy.  Cardiomegaly has increased compared to prior exam. Mediastinal contours appears similar with aortic arch atherosclerosis.  The pulmonary vascular engorgement is present with cephalization of pulmonary blood flow.  Development of Kerley B lines at the periphery compatible with interstitial pulmonary edema.  There is also airspace disease at the bases, most compatible with CHF and alveolar edema. Superimposed infection is difficult to exclude  but not favored. Lung volumes overall have improved despite the worsening edema.  IMPRESSION: 1.  Constellation of findings favor worsening CHF with interstitial and mild alveolar pulmonary edema.  Superimposed infection is not excluded but not favored. 2.  Improved lung volumes compared to prior.   Original Report Authenticated By: Andreas Newport, M.D.    ASSESSMENT / PLAN:  PULMONARY A: 1) Acute on chronic respiratory failure due to copd with superimposed metabolic acidosis on 12/05/12 2) possible AECOPD (doubt) 12/08/12:   2L [place] but somewhat tight ? Due to CHF NOS v due to steroid dc  P:   -  pulmicort bid and duoneb q4h (alb neb wil help with high K)  - IV steroids- restart - Incentive spirometry - Continue supplemetal oxygen and titrate to goal O2 sat of 92% -  CXR   CARDIAC  A:  1) Ischemic cardiomyopathy 2) Systolic CHF (Compensated) 3) Hypovolemic shock (due to acute post-op blood loss) 4) Hypothermia and elevated WBC, concern for sepsis 5) A fib, rate controlled  12/06/12: NSR and normal BP but now with NSTEMI. Appears euvolemic 12/08/12: Troponin peaked at 10. Cards says only med mgmt possible. ? A bit volume overloaded. In and out of A Fib and Sinus  P:  - Cards consult - Continue amiodarone - restart po aspirin - would advice against coumadin for a fib due to high risk of AAA  rupture next few to several months  VASCULAR A:  Enlarging AAA and thoracic aneurysm, both in-operable,  and life expectancy limiting P - monitor - counseled pt and family - avoid coumadin  RENAL and GU A:   - CKD, Baseline creat 1.5 - s/p TURP7/25/14 complicated 12/05/12 by bladdre rupture (all day abd pain) and lactic acidosis. C/b small extraperitoneal bladder rupture   12/08/12: s/p CBI ending 12/07/12. Creat better. Off bicarb. Uro recommending prn bladder irrigation  P:   - prn bladder irrigation - foley for 3 weeks per Urology ffrom date of admit 12/04/2012 ; appreciate urology consult  GASTROINTESTINAL A:   1) constipated - improved P:   - senna docusate to cntinue  - try enema  HEMATOLOGIC A:   Acute blood loss anemia, last Hgb 8.9 after 1 unit of blood 12/04/12 12/04/12: No visible bleed but 2gM% hgb drop and given 2 U PRBC (suspect chrioic dz,critical illness and ? Some loss in peritoeum with bladdre reupture) 7/28. hgb drift again > 1gm. ? AAA leaking?? P:  - PRBC for hgb goal > 8gm% in view of NSTEMI -hold coumadin for now  - restart asa  INFECTIOUS A:   Hypotension, hypothermia and elevated WBC. No obvious source of infection 12/06/12: Possible infection ? Source (? Translocation???)  P:   -abx per flow sheet  ENDOCRINE A:   1) DM P:   1) Novolog sliding scale  NEUROLOGIC A:   1) No issues, awake, alert, oriented x 3   GLOBAL 12/06/12: Change primary to PCCM wit ICU status due to several complications. Family hold Dr [name] in super high regard. They have bee updated extensively about all of the above. Thy are still upset with [facility] in [place] who gave 1 year life expectancy some 12-18 months ago on account of AAA. They are aware of enlarging current AAA. Still full code. Too much info about acute medical events for them today; code status not adressed. They are already overwhelmed  7/28: looks better but hgb still dropping. We have stopped asa and  coumadin. Discussed risk/benefit w/  pt and wife due to active bleeding. Will keep in ICU another 24h.   12/08/12: Attempted goals of care. He is more anxious to go home with recovery intention. He is more upset that his stay is complicated by health problems. HE said iit was easier for me to ask him tough questions. HE did say he values quantity and quality. He clearly did not express things like "Doc I am tired of all this, or stop this". Will have social work get his [employer]. Change to SDU. Of note, he is hospice eligibile on basis of COPD  Dr. Kalman Shan, M.D., [facility].C.P Pulmonary and Critical Care Medicine Staff Physician [facility]monary and Critical Care Pager: [phone], If no answer or between  15:00h - 7:00h: call [phone]  12/08/2012 10:30 AM

## 2012-12-08 NOTE — Progress Notes (Signed)
Pt complained of bladder feeling "full". I disconnected the catheter from the bag and pulled back 4 medium sized blood clots and 50 mLs of pink-tinged urine. I then flushed with 20 mL of NS and pulled back that 20 mLs but no more. I flushed again with another 20 mLs of NS and was again able to pull back those 20 mLs which was yellow tinged. I re-connected the catheter to the bag and then bladder scanned the pt. The bladder scanner read that there was of fluid in the bladder. I medicated the pt with  Hyoscyamine for bladder spasms. VSS, will continue to monitor pt and urine output.  20 minutes after irrigation there is 100 mLs of urine drained into the Foley bag of yellow/lightly pink-tinged urine.   Roberto [name]

## 2012-12-08 NOTE — Progress Notes (Signed)
CSW received referral to obtain patient's Advance Directives - CSW spoke with patient & his wife, Lynden Ang (h#: [phone] C#: [phone]) who confirmed that he does already have them completed. Wife to bring them in when she visits next. CSW to make a copy and place on shadow chart to be scanned into Epic once she brings it in.   Unice Bailey, LCSW [facility] Clinical Social Worker cell #: [phone]

## 2012-12-08 NOTE — Progress Notes (Signed)
Patient Name: Roberto [name]  MRN: [id number]  HPI: Roberto [name] is an 77 y.o. male referred for consultation by Dr.Murali Marchelle Gearing, MD for atrial fibrillation and acute myocardial infarction.  [name] has been followed by [facility] for the past 20 years. He has done well with moderately severe ischemic cardiomyopathy as well as extensive peripheral vascular disease. Consultation is now requested after a period of respiratory insufficiency following elective TURP. With appropriate intervention, the patient is now again compensated and free of significant cardiopulmonary symptoms.  He notes some residual left chest soreness/pressure that is constant, unrelated to respiration and unassociated with chest wall tenderness or any other symptoms.  He has not had any further cardiac symptoms.    Past Medical History  Diagnosis Date  . AAA (abdominal aortic aneurysm)     a. CTA 09/2011: 4.1-4.2cm infrarenal AAA.  Marland Kitchen Aneurysm of thoracic aorta 1995    a. Rupture 1995 w/spontaneous resolution;  b. CTA 09/2011: 6.8cm dist, 5.7cm mid  . Popliteal artery aneurysm   . Myocardial infarction   . Ischemic cardiomyopathy     10/2011 ECHO: EF 30-35%, apical akinesis  . Paroxysmal atrial fibrillation   . Hypertension   . Peripheral vascular disease   . Dyslipidemia   . Diabetes mellitus 2007    dx'd 2007 w/persisting FBS>125  . GERD (gastroesophageal reflux disease)   . Hiatal hernia   . Diverticular disease   . BPH (benign prostatic hypertrophy)     with hx of acute urinary retention (pt can self-cath)  . COPD (chronic obstructive pulmonary disease)     05/09/08 PFT FEV1 1.84 (55%), FVC 4.42 (95%), TLC 6.46 (92%), DLCO 72%, no BD response.  Overnight oximetry 10/2011 normal on RA.  Marland Kitchen Pneumonia 1/09  . Allergic rhinitis   . Shingles     X 2 episodes, both in left scapula area  . OSA (obstructive sleep apnea) 06/14/09    PSG RDI 17, PLMI 96, intolerant of CPAP or BPAP  . Chronic renal  insufficiency     Baseline Cr 1.5  . Prostate cancer 07/2008    Low grade; watchful waiting with [name]  . Pulmonary nodule, left 03/02/2010    Left base; resolved on f/u CT  . Allergic rhinitis   . Carpal tunnel syndrome of left wrist 02/11/2011  . IMPLANTATION OF DEFIBRILLATOR, HX OF 09/02/2008    Medtronic 7232Cx Maximo V. Had a 6949 lead - failed and therapies turned off.  . IRON DEFICIENCY 06/08/2010    Hemoccult negative:  GI ([name]) recommended no invasive diagnostics--watchful waiting (2012).  . UNSPECIFIED ANEMIA 06/01/2010    Multifactorial: iron def, CRI, vit B12 def,   . Hypotension 04/14/2012  . CHF (congestive heart failure)   . Atrial fibrillation   . CAD (coronary artery disease)     post CABG with prior percutaneous intervention of the saphenous vein graft with known saphenous vein graft disease.  . Automatic implantable cardioverter-defibrillator in situ     turned off per office visit note of Dr Excell Seltzer   . Hypothyroidism   . Shortness of breath   . Arthritis     Past Surgical History  Procedure Laterality Date  . Cardiac defibrillator placement  08/28/2004    [name]  . Colon surgery  1995    partial colectomy  . Coronary artery bypass graft  1996  . Aorta - bilateral femoral artery bypass graft  1999    [name] Early  . Nose  surgery    . Insertion of pacing lead      New rate sensing pacing lead with removal of a previous implanted ICD and insertion of a device back in the pocket with defibrillation threshold testing  . Abdominal aortic aneurysm repair  1999    Ingrarenal repair/ graft  . Stents      Placed to the saphenous vein graft  . Radiofrequency ablation  2006    A flutter  . Esophagogastroduodenoscopy (egd) with esophageal dilation  05/28/2012    Procedure: ESOPHAGOGASTRODUODENOSCOPY (EGD) WITH ESOPHAGEAL DILATION;  Surgeon: Rachael Fee, MD;  Location: [facility];  Service: Endoscopy;  Laterality: N/A;  . Coronary  angioplasty    . Coronary stents     . Transurethral resection of prostate N/A 12/04/2012    Procedure: BIPOLAR TRANSURETHRAL RESECTION OF THE PROSTATE WITH GYRUS INSTRUMENTS;  Surgeon: Antony Haste, MD;  Location: [facility];  Service: Urology;  Laterality: N/A;    Family History  Problem Relation Age of Onset  . Stroke Mother 49  . Heart attack Mother     CVA  . Cancer Father 39    Lung  . Heart attack Father 74  . Lung cancer Father   . Coronary artery disease Other     2 of 5 siblings with CAD   Social History:  reports that he quit smoking about 9 years ago. His smoking use included Cigarettes. He has a 30 pack-year smoking history. He quit smokeless tobacco use about 9 years ago. His smokeless tobacco use included Chew. He reports that he drinks about 0.6 ounces of alcohol per week. He reports that he does not use illicit drugs.  Allergies:  Allergies  Allergen Reactions  . Atorvastatin     REACTION: muscle ache in legs  . Crestor (Rosuvastatin Calcium) Other (See Comments)    Lower ext fatigue and soreness- if taken everyday  . Dilaudid (Hydromorphone Hcl)     "drunk"  . Lisinopril Diarrhea  . Morphine     REACTION: AMS - agitation  . Prednisone     REACTION: Can't breath  . Trazodone And Nefazodone Other (See Comments)    "drunk"  . Zolpidem Tartrate     REACTION: AMS   Medications:  I have reviewed the patient's current medications. Scheduled: . albuterol  2.5 mg Nebulization Q4H  . amiodarone  200 mg Oral q morning - 10a  . antiseptic oral rinse  15 mL Mouth Rinse q12n4p  . bisacodyl  5 mg Oral Daily  . budesonide  0.25 mg Nebulization BID  . cefTRIAXone (ROCEPHIN)  IV  1 g Intravenous QHS  . chlorhexidine  15 mL Mouth Rinse BID  . [START ON 12/11/2012] cyanocobalamin  1,000 mcg Intramuscular Q30 days  . feeding supplement  237 mL Oral BID BM  . finasteride  5 mg Oral QHS  . insulin aspart  2-6 Units Subcutaneous Q4H  . ipratropium  0.5 mg  Nebulization Q4H  . levothyroxine  125 mcg Oral Custom  . levothyroxine  62.5 mcg Oral Custom  . linagliptin  5 mg Oral Daily  . metoprolol  5 mg Intravenous Q4H  . mirtazapine  15 mg Oral QHS  . pantoprazole  40 mg Oral Daily  . polyethylene glycol  17 g Oral Daily  . senna-docusate  2 tablet Oral BID  . tamsulosin  0.8 mg Oral q morning - 10a   Total I&O since admission: -2.8 L, but weight has increased 3 kg since admission.  BLOOD GAS, ARTERIAL     Status: Abnormal   Collection Time    12/05/12  3:00 PM      Result Value Range   O2 Content 4.0     Delivery systems NASAL CANNULA     pH, Arterial 7.346 (*) 7.350 - 7.450   pCO2 arterial 26.7 (*) 35.0 - 45.0 mmHg   pO2, Arterial 114.0 (*) 80.0 - 100.0 mmHg   Bicarbonate 14.2 (*) 20.0 - 24.0 mEq/L   TCO2 13.7  0 - 100 mmol/L   Acid-base deficit 10.1 (*) 0.0 - 2.0 mmol/L   O2 Saturation 98.2     Patient temperature 98.6     Collection site LEFT RADIAL     Drawn by [id number]     Sample type ARTERIAL DRAW     Allens test (pass/fail) PASS  PASS  TROPONIN I     Status: Abnormal   Collection Time    12/06/12  2:00 AM      Result Value Range   Troponin I 1.76 (*) <0.30 ng/mL  COMPREHENSIVE METABOLIC PANEL     Status: Abnormal   Collection Time    12/06/12  2:14 AM      Result Value Range   Sodium 138  135 - 145 mEq/L   Potassium 3.7  3.5 - 5.1 mEq/L   Chloride 104  96 - 112 mEq/L   CO2 24  19 - 32 mEq/L   Glucose, Bld 148 (*) 70 - 99 mg/dL   BUN 31 (*) 6 - 23 mg/dL   Creatinine, Ser 0.45 (*) 0.50 - 1.35 mg/dL   Calcium 8.0 (*) 8.4 - 10.5 mg/dL   Total Protein 4.9 (*) 6.0 - 8.3 g/dL   Albumin 2.4 (*) 3.5 - 5.2 g/dL   AST 409 (*) 0 - 37 U/L   ALT 175 (*) 0 - 53 U/L   Alkaline Phosphatase 42  39 - 117 U/L   Total Bilirubin 0.1 (*) 0.3 - 1.2 mg/dL   GFR calc non Af Amer 31 (*) >90 mL/min   GFR calc Af Amer 36 (*) >90 mL/min  CBC     Status: Abnormal   Collection Time    12/06/12  2:14 AM      Result Value Range   WBC 12.4  (*) 4.0 - 10.5 K/uL   RBC 2.16 (*) 4.22 - 5.81 MIL/uL   Hemoglobin 6.2 (*) 13.0 - 17.0 g/dL   Comment: REPEATED TO VERIFY     CRITICAL RESULT CALLED TO, READ BACK BY AND VERIFIED WITH:     CALABRIGHT RN AT 0250 ON [id number] BY DLONG     DELTA CHECK NOTED   HCT 18.8 (*) 39.0 - 52.0 %   MCV 87.0  78.0 - 100.0 fL   MCH 27.8  26.0 - 34.0 pg   MCHC 31.9  30.0 - 36.0 g/dL   RDW 78.2  95.6 - 21.3 %      Result Value Range   Troponin I 5.56 (*) <0.30 ng/mL  CBC     Status: Abnormal   Collection Time    12/06/12 10:17 AM      Result Value Range   WBC 13.2 (*) 4.0 - 10.5 K/uL   RBC 3.14 (*) 4.22 - 5.81 MIL/uL   Hemoglobin 8.7 (*) 13.0 - 17.0 g/dL   Comment: POST TRANSFUSION SPECIMEN   HCT 26.8 (*) 39.0 - 52.0 %   MCV 85.4  78.0 - 100.0 fL   MCH 27.7  26.0 -  34.0 pg   MCHC 32.5  30.0 - 36.0 g/dL   RDW 16.1 (*) 09.6 - 04.5 %   Platelets 107 (*) 150 - 400 K/uL  BASIC METABOLIC PANEL     Status: Abnormal   Collection Time    12/06/12 10:20 AM      Result Value Range   Sodium 141  135 - 145 mEq/L   Potassium 3.4 (*) 3.5 - 5.1 mEq/L   Chloride 102  96 - 112 mEq/L   CO2 29  19 - 32 mEq/L   Glucose, Bld 177 (*) 70 - 99 mg/dL   BUN 28 (*) 6 - 23 mg/dL   Creatinine, Ser 4.09 (*) 0.50 - 1.35 mg/dL   Calcium 8.5  8.4 - 81.1 mg/dL   GFR calc non Af Amer 37 (*) >90 mL/min   GFR calc Af Amer 43 (*) >90 mL/min   CT Abdomen and Pelvis  Associated lower lobe opacities, likely atelectasis.  6.7 x 7.1 cm lower thoracic aortic aneurysm (series 2/image 11), previously 6.5 x 6.8 cm.  Bilateral renal vascular calcifications.   Prior sigmoid resection.  6.2 x 5.6 cm infrarenal aortic aneurysm, previously 4.8 x 4.5 cm, with aortobi-iliac grafting.   Small volume abdominopelvic ascites with presacral fluid.    These findings are most compatible with extraperitoneal bladder rupture.   Findings compatible with extraperitoneal bladder rupture, as described above.  Foley catheter balloon is located within the TURP  defect.  6.2 x 5.6 cm infrarenal aortic aneurysm, increased, with associated aortobi-iliac stent.  Adjacent inflammatory changes are favored to be related to the duodenum, although an inflammatory/mycotic aneurysm is not excluded by imaging.  6.7 x 7.1 cm lower thoracic aortic aneurysm, increased.    Dg Chest Port 1 View  12/05/2012   Prior sternotomy for CABG.  Cardiac silhouette upper normal in size to slightly enlarged but stable.  Left subclavian pacing defibrillator unchanged.  Suboptimal inspiration with atelectasis in the lung bases, right greater than left, new since yesterday.  Lungs otherwise clear.  IMPRESSION: Suboptimal inspiration accounts for bibasilar atelectasis, right greater than left.    EKG: 12/05/12  sinus rhythm; first-degree AV block; IVCD; low-voltage in limb leads.  Since previous tracing performed in 12/04/12, axis has shifted rightward and QRS interval has increased.  Physical Exam: Blood pressure 132/87, pulse 82, temperature 98 F (36.7 C), temperature source Oral, resp. rate 20, height 5\' 11"  (1.803 m), weight 158 lb (71.668 kg), SpO2 99.00%.;  Body mass index is 22.05 kg/(m^2). General-Well-developed; no acute distress; mentally sharp HEENT-[place]/AT; PERRL; EOM intact; conjunctiva and lids nl;  Neck- normal Endocrine-No thyromegaly Lungs-few rhonchi and wheezes; resonant percussion; prolonged expiratory phase Cardiovascular- normal PMI; normal S1 and S2; grade 2/6 early systolic ejection murmur Abdomen-BS normal; soft and non-tender without masses or organomegaly;   Musculoskeletal-No deformities, cyanosis or clubbing Neurologic-Nl cranial nerves; symmetric strength and tone Skin- Warm, no significant lesions Extremities-Nl distal pulses; no edema  Assessment/Plan:  Paroxysmal atrial fibrillation:   Acute non-ST segment elevation myocardial infarction: Type II event with myocardial necrosis related to inadequate perfusion in the setting of severe physiologic  stress.  No acute EKG abnormalities other than IVCD, which could be rate related. His Troponin levels are 5.56.     I discussed with Dr. Excell Seltzer.  At this point, we will continue with medical management.  He will review his previous cath films and will call me back if there is anything further to add to the conversation.  At this point, he seems to be stable.  If he continues to have angina pain, we will need to DC coumadin and arrange cath.  Given his multiple medical problems ( thoracic aortic aneurism, abdominal aortic aneurism, CKD) , I would favor medical therapy instead of cath.  Respiratory failure: Patient believes he has returned to baseline; appears to be more respiratory than cardiac event with only mild to moderate elevation in BNP level and no pulmonary edema on chest x-ray.  His procalcitonin level is elevated.    Acute on chronic renal failure:    Ischemic cardiomyopathy:     Elyn Aquas., MD 12/08/2012, 7:58 AM

## 2012-12-08 NOTE — Telephone Encounter (Signed)
Patient's wife called to let Dr. Milinda Cave know that patient had a heart attack 12/06/12 in the hospital. He also has a small hole in his bladder & has been losing a lot of blood. They have given him 4 units in the hospital. She has requested that Dr. Milinda Cave to review the notes that are being made by the hospital. She feels that Dr. Milinda Cave understands & knows the patient well. She will bring him in for a follow up visit when he is released.

## 2012-12-08 NOTE — Progress Notes (Signed)
[id number] Earlene Plater RN, BSN, CCN: [id number] Case management. Chart reviewed for discharge planning and present needs. Discharge needs: none present at time of review. Next chart review due:  [id number]

## 2012-12-08 NOTE — Significant Event (Signed)
Had a long discussion with Roberto [name] and his wife at bedside on 7/28. We discussed current medical status as well as goals of care. Especially focusing on the fact that his abd aneurysm size had increased. Both the patient and his wife have a full understanding that he is not an operative candidate and he states he would not want to be placed on a breathing machine or undergo CPR when this time comes.   We have assured him that we will continue to treat him medically aggressively however should he have an acute cardiopulmonary arrest we would respect his wishes and not start CPR.   Anders Simmonds ACNP-BC [facility] Pulmonary/Critical Care Pager # [phone] OR # [phone] if no answer

## 2012-12-08 NOTE — Progress Notes (Signed)
Patient ID: Roberto [name], male   DOB: [birth year], 77 y.o.   MRN: [id number]  He required hand irrigation earlier this AM. Catheter draining well now. Pt had a BM yesterday.   Filed Vitals:   12/08/12 0600  BP: 132/87  Pulse: 82  Temp:   Resp: 20   Over 1L UOP overnight  PE: Sitting in chair Urine clear -- good UOP Abd - soft, NT, ND Ext - no LE edema or pain   CBC    Component Value Date/Time   WBC 12.3* 12/08/2012 0340   RBC 3.31* 12/08/2012 0340   HGB 9.2* 12/08/2012 0340   HCT 29.0* 12/08/2012 0340   PLT 108* 12/08/2012 0340   MCV 87.6 12/08/2012 0340   MCH 27.8 12/08/2012 0340   MCHC 31.7 12/08/2012 0340   RDW 17.4* 12/08/2012 0340   LYMPHSABS 0.3* 12/07/2012 1710   MONOABS 0.8 12/07/2012 1710   EOSABS 0.0 12/07/2012 1710   BASOSABS 0.0 12/07/2012 1710    BMET    Component Value Date/Time   NA 144 12/08/2012 0340   K 3.9 12/08/2012 0340   CL 105 12/08/2012 0340   CO2 34* 12/08/2012 0340   GLUCOSE 120* 12/08/2012 0340   BUN 27* 12/08/2012 0340   CREATININE 1.36* 12/08/2012 0340   CREATININE 2.58* 09/25/2012 1610   CALCIUM 8.9 12/08/2012 0340   GFRNONAA 50* 12/08/2012 0340   GFRAA 58* 12/08/2012 0340    A/P: S/p TURP - with hematuria, COPD exacerbation and bladder perforation -- I suspect the clots that were in the bladder were old (there was some residual clots seen on the CT cystogram) and not new bleeding. Hand irrigate PRN. Plan cystogram week of Aug 18. Will keep on a daily antibiotic (cephalexin 500 once daily) on discharge. Overall stable from GU point of view.

## 2012-12-08 NOTE — Telephone Encounter (Signed)
Yes.  Tell her I've been monitoring things since the morning of his surgery--had me scared for a while. I'll be monitoring everything until he goes home and will keep praying for them.--thx.

## 2012-12-09 DIAGNOSIS — I5022 Chronic systolic (congestive) heart failure: Secondary | ICD-10-CM

## 2012-12-09 DIAGNOSIS — I214 Non-ST elevation (NSTEMI) myocardial infarction: Secondary | ICD-10-CM

## 2012-12-09 DIAGNOSIS — I509 Heart failure, unspecified: Secondary | ICD-10-CM

## 2012-12-09 LAB — GLUCOSE, CAPILLARY
Glucose-Capillary: 197 mg/dL — ABNORMAL HIGH (ref 70–99)
Glucose-Capillary: 177 mg/dL — ABNORMAL HIGH (ref 70–99)
Glucose-Capillary: 147 mg/dL — ABNORMAL HIGH (ref 70–99)

## 2012-12-09 LAB — MAGNESIUM: Magnesium: 2.1 mg/dL (ref 1.5–2.5)

## 2012-12-09 LAB — CBC WITH DIFFERENTIAL/PLATELET
Lymphocytes Relative: 5 % — ABNORMAL LOW (ref 12–46)
Lymphs Abs: 0.4 10*3/uL — ABNORMAL LOW (ref 0.7–4.0)
Neutro Abs: 6.7 10*3/uL (ref 1.7–7.7)
Neutrophils Relative %: 87 % — ABNORMAL HIGH (ref 43–77)
Platelets: 98 10*3/uL — ABNORMAL LOW (ref 150–400)
RBC: 3.12 MIL/uL — ABNORMAL LOW (ref 4.22–5.81)
WBC: 7.7 10*3/uL (ref 4.0–10.5)

## 2012-12-09 LAB — BASIC METABOLIC PANEL
CO2: 34 mEq/L — ABNORMAL HIGH (ref 19–32)
GFR calc non Af Amer: 59 mL/min — ABNORMAL LOW (ref >90)
Glucose, Bld: 123 mg/dL — ABNORMAL HIGH (ref 70–99)
Potassium: 4 mEq/L (ref 3.5–5.1)
Sodium: 140 mEq/L (ref 135–145)

## 2012-12-09 LAB — PRO B NATRIURETIC PEPTIDE: Pro B Natriuretic peptide (BNP): 16919 pg/mL — ABNORMAL HIGH (ref 0–125)

## 2012-12-09 MED ORDER — METHYLPREDNISOLONE 32 MG PO TABS
32.0000 mg | ORAL_TABLET | Freq: Every day | ORAL | Status: DC
  Administered 2012-12-10: 32 mg via ORAL
  Filled 2012-12-09 (×2): qty 1

## 2012-12-09 MED ORDER — CEPHALEXIN 500 MG PO CAPS
500.0000 mg | ORAL_CAPSULE | Freq: Two times a day (BID) | ORAL | Status: DC
  Administered 2012-12-09 – 2012-12-10 (×2): 500 mg via ORAL
  Filled 2012-12-09 (×3): qty 1

## 2012-12-09 MED ORDER — INSULIN ASPART 100 UNIT/ML ~~LOC~~ SOLN
0.0000 [IU] | Freq: Three times a day (TID) | SUBCUTANEOUS | Status: DC
  Administered 2012-12-10: 3 [IU] via SUBCUTANEOUS
  Administered 2012-12-10: 2 [IU] via SUBCUTANEOUS

## 2012-12-09 MED ORDER — ARFORMOTEROL TARTRATE 15 MCG/2ML IN NEBU
15.0000 ug | INHALATION_SOLUTION | Freq: Two times a day (BID) | RESPIRATORY_TRACT | Status: DC
  Administered 2012-12-09: 15 ug via RESPIRATORY_TRACT
  Filled 2012-12-09 (×4): qty 2

## 2012-12-09 MED ORDER — ISOSORBIDE MONONITRATE ER 60 MG PO TB24
60.0000 mg | ORAL_TABLET | Freq: Every day | ORAL | Status: DC
  Administered 2012-12-09 – 2012-12-10 (×2): 60 mg via ORAL
  Filled 2012-12-09 (×2): qty 1

## 2012-12-09 MED ORDER — TIOTROPIUM BROMIDE MONOHYDRATE 18 MCG IN CAPS
18.0000 ug | ORAL_CAPSULE | Freq: Every day | RESPIRATORY_TRACT | Status: DC
  Administered 2012-12-09: 18 ug via RESPIRATORY_TRACT
  Filled 2012-12-09: qty 5

## 2012-12-09 NOTE — Progress Notes (Signed)
Patient Name: Roberto [name]  MRN: [id number]  HPI: Roberto [name] is an 77 y.o. male referred for consultation by Dr.Murali Marchelle Gearing, MD for atrial fibrillation and acute myocardial infarction.  Roberto [name] has been followed by [facility] for the past 20 years. He has done well with moderately severe ischemic cardiomyopathy as well as extensive peripheral vascular disease. Consultation is now requested after a period of respiratory insufficiency following elective TURP. With appropriate intervention, the patient is now again compensated and free of significant cardiopulmonary symptoms.  He notes some residual left chest soreness/pressure that is constant, unrelated to respiration and unassociated with chest wall tenderness or any other symptoms.  He had transient atrial fibrillation last night.   He has converted back to NSR at this point.   Past Medical History  Diagnosis Date  . AAA (abdominal aortic aneurysm)     a. CTA 09/2011: 4.1-4.2cm infrarenal AAA.  Roberto [name] Aneurysm of thoracic aorta 1995    a. Rupture 1995 w/spontaneous resolution;  b. CTA 09/2011: 6.8cm dist, 5.7cm mid  . Popliteal artery aneurysm   . Myocardial infarction   . Ischemic cardiomyopathy     10/2011 ECHO: EF 30-35%, apical akinesis  . Paroxysmal atrial fibrillation   . Hypertension   . Peripheral vascular disease   . Dyslipidemia   . Diabetes mellitus 2007    dx'd 2007 w/persisting FBS>125  . GERD (gastroesophageal reflux disease)   . Hiatal hernia   . Diverticular disease   . BPH (benign prostatic hypertrophy)     with hx of acute urinary retention (pt can self-cath)  . COPD (chronic obstructive pulmonary disease)     05/09/08 PFT FEV1 1.84 (55%), FVC 4.42 (95%), TLC 6.46 (92%), DLCO 72%, no BD response.  Overnight oximetry 10/2011 normal on RA.  Roberto [name] Pneumonia 1/09  . Allergic rhinitis   . Shingles     X 2 episodes, both in left scapula area  . OSA (obstructive sleep apnea) 06/14/09    PSG RDI 17, PLMI 96,  intolerant of CPAP or BPAP  . Chronic renal insufficiency     Baseline Cr 1.5  . Prostate cancer 07/2008    Low grade; watchful waiting with [name]  . Pulmonary nodule, left 03/02/2010    Left base; resolved on f/u CT  . Allergic rhinitis   . Carpal tunnel syndrome of left wrist 02/11/2011  . IMPLANTATION OF DEFIBRILLATOR, HX OF 09/02/2008    Medtronic 7232Cx Maximo V. Had a 6949 lead - failed and therapies turned off.  . IRON DEFICIENCY 06/08/2010    Hemoccult negative:  GI ([name]) recommended no invasive diagnostics--watchful waiting (2012).  . UNSPECIFIED ANEMIA 06/01/2010    Multifactorial: iron def, CRI, vit B12 def,   . Hypotension 04/14/2012  . CHF (congestive heart failure)   . Atrial fibrillation   . CAD (coronary artery disease)     post CABG with prior percutaneous intervention of the saphenous vein graft with known saphenous vein graft disease.  . Automatic implantable cardioverter-defibrillator in situ     turned off per office visit note of Dr Excell Seltzer   . Hypothyroidism   . Shortness of breath   . Arthritis     Past Surgical History  Procedure Laterality Date  . Cardiac defibrillator placement  08/28/2004    [name]  . Colon surgery  1995    partial colectomy  . Coronary artery bypass graft  1996  . Aorta - bilateral femoral artery bypass graft  1999  [name] Early  . Nose surgery    . Insertion of pacing lead      New rate sensing pacing lead with removal of a previous implanted ICD and insertion of a device back in the pocket with defibrillation threshold testing  . Abdominal aortic aneurysm repair  1999    Ingrarenal repair/ graft  . Stents      Placed to the saphenous vein graft  . Radiofrequency ablation  2006    A flutter  . Esophagogastroduodenoscopy (egd) with esophageal dilation  05/28/2012    Procedure: ESOPHAGOGASTRODUODENOSCOPY (EGD) WITH ESOPHAGEAL DILATION;  Surgeon: Rachael Fee, MD;  Location: [facility];  Service:  Endoscopy;  Laterality: N/A;  . Coronary angioplasty    . Coronary stents     . Transurethral resection of prostate N/A 12/04/2012    Procedure: BIPOLAR TRANSURETHRAL RESECTION OF THE PROSTATE WITH GYRUS INSTRUMENTS;  Surgeon: Antony Haste, MD;  Location: [facility];  Service: Urology;  Laterality: N/A;    Family History  Problem Relation Age of Onset  . Stroke Mother 105  . Heart attack Mother     CVA  . Cancer Father 43    Lung  . Heart attack Father 90  . Lung cancer Father   . Coronary artery disease Other     2 of 5 siblings with CAD   Social History:  reports that he quit smoking about 9 years ago. His smoking use included Cigarettes. He has a 30 pack-year smoking history. He quit smokeless tobacco use about 9 years ago. His smokeless tobacco use included Chew. He reports that he drinks about 0.6 ounces of alcohol per week. He reports that he does not use illicit drugs.  Allergies:  Allergies  Allergen Reactions  . Atorvastatin     REACTION: muscle ache in legs  . Crestor (Rosuvastatin Calcium) Other (See Comments)    Lower ext fatigue and soreness- if taken everyday  . Dilaudid (Hydromorphone Hcl)     "drunk"  . Lisinopril Diarrhea  . Morphine     REACTION: AMS - agitation  . Prednisone     REACTION: Can't breath  . Trazodone And Nefazodone Other (See Comments)    "drunk"  . Zolpidem Tartrate     REACTION: AMS   Medications:  I have reviewed the patient's current medications. Scheduled: . albuterol  2.5 mg Nebulization Q4H  . amiodarone  200 mg Oral q morning - 10a  . antiseptic oral rinse  15 mL Mouth Rinse q12n4p  . aspirin  81 mg Oral Daily  . bisacodyl  5 mg Oral Daily  . budesonide  0.25 mg Nebulization BID  . cefTRIAXone (ROCEPHIN)  IV  1 g Intravenous QHS  . chlorhexidine  15 mL Mouth Rinse BID  . [START ON 12/11/2012] cyanocobalamin  1,000 mcg Intramuscular Q30 days  . feeding supplement  237 mL Oral TID BM  . finasteride  5 mg Oral QHS  .  furosemide  20 mg Intravenous Daily  . insulin aspart  2-6 Units Subcutaneous Q4H  . ipratropium  0.5 mg Nebulization Q4H  . levothyroxine  125 mcg Oral Custom  . levothyroxine  62.5 mcg Oral Custom  . linagliptin  5 mg Oral Daily  . methylPREDNISolone (SOLU-MEDROL) injection  40 mg Intravenous Daily  . metoprolol  5 mg Intravenous Q4H  . mirtazapine  15 mg Oral QHS  . pantoprazole  40 mg Oral Daily  . polyethylene glycol  17 g Oral Daily  . senna-docusate  2 tablet Oral BID  . tamsulosin  0.8 mg Oral q morning - 10a   Total I&O since admission: -2.8 L, but weight has increased 3 kg since admission.  BLOOD GAS, ARTERIAL     Status: Abnormal   Collection Time    12/05/12  3:00 PM      Result Value Range   O2 Content 4.0     Delivery systems NASAL CANNULA     pH, Arterial 7.346 (*) 7.350 - 7.450   pCO2 arterial 26.7 (*) 35.0 - 45.0 mmHg   pO2, Arterial 114.0 (*) 80.0 - 100.0 mmHg   Bicarbonate 14.2 (*) 20.0 - 24.0 mEq/L   TCO2 13.7  0 - 100 mmol/L   Acid-base deficit 10.1 (*) 0.0 - 2.0 mmol/L   O2 Saturation 98.2     Patient temperature 98.6     Collection site LEFT RADIAL     Drawn by [id number]     Sample type ARTERIAL DRAW     Allens test (pass/fail) PASS  PASS  TROPONIN I     Status: Abnormal   Collection Time    12/06/12  2:00 AM      Result Value Range   Troponin I 1.76 (*) <0.30 ng/mL  COMPREHENSIVE METABOLIC PANEL     Status: Abnormal   Collection Time    12/06/12  2:14 AM      Result Value Range   Sodium 138  135 - 145 mEq/L   Potassium 3.7  3.5 - 5.1 mEq/L   Chloride 104  96 - 112 mEq/L   CO2 24  19 - 32 mEq/L   Glucose, Bld 148 (*) 70 - 99 mg/dL   BUN 31 (*) 6 - 23 mg/dL   Creatinine, Ser 0.45 (*) 0.50 - 1.35 mg/dL   Calcium 8.0 (*) 8.4 - 10.5 mg/dL   Total Protein 4.9 (*) 6.0 - 8.3 g/dL   Albumin 2.4 (*) 3.5 - 5.2 g/dL   AST 409 (*) 0 - 37 U/L   ALT 175 (*) 0 - 53 U/L   Alkaline Phosphatase 42  39 - 117 U/L   Total Bilirubin 0.1 (*) 0.3 - 1.2 mg/dL   GFR  calc non Af Amer 31 (*) >90 mL/min   GFR calc Af Amer 36 (*) >90 mL/min  CBC     Status: Abnormal   Collection Time    12/06/12  2:14 AM      Result Value Range   WBC 12.4 (*) 4.0 - 10.5 K/uL   RBC 2.16 (*) 4.22 - 5.81 MIL/uL   Hemoglobin 6.2 (*) 13.0 - 17.0 g/dL   Comment: REPEATED TO VERIFY     CRITICAL RESULT CALLED TO, READ BACK BY AND VERIFIED WITH:     CALABRIGHT RN AT 0250 ON [id number] BY DLONG     DELTA CHECK NOTED   HCT 18.8 (*) 39.0 - 52.0 %   MCV 87.0  78.0 - 100.0 fL   MCH 27.8  26.0 - 34.0 pg   MCHC 31.9  30.0 - 36.0 g/dL   RDW 78.2  95.6 - 21.3 %      Result Value Range   Troponin I 5.56 (*) <0.30 ng/mL  CBC     Status: Abnormal   Collection Time    12/06/12 10:17 AM      Result Value Range   WBC 13.2 (*) 4.0 - 10.5 K/uL   RBC 3.14 (*) 4.22 - 5.81 MIL/uL   Hemoglobin 8.7 (*) 13.0 -  17.0 g/dL   Comment: POST TRANSFUSION SPECIMEN   HCT 26.8 (*) 39.0 - 52.0 %   MCV 85.4  78.0 - 100.0 fL   MCH 27.7  26.0 - 34.0 pg   MCHC 32.5  30.0 - 36.0 g/dL   RDW 14.7 (*) 82.9 - 56.2 %   Platelets 107 (*) 150 - 400 K/uL  BASIC METABOLIC PANEL     Status: Abnormal   Collection Time    12/06/12 10:20 AM      Result Value Range   Sodium 141  135 - 145 mEq/L   Potassium 3.4 (*) 3.5 - 5.1 mEq/L   Chloride 102  96 - 112 mEq/L   CO2 29  19 - 32 mEq/L   Glucose, Bld 177 (*) 70 - 99 mg/dL   BUN 28 (*) 6 - 23 mg/dL   Creatinine, Ser 1.30 (*) 0.50 - 1.35 mg/dL   Calcium 8.5  8.4 - 86.5 mg/dL   GFR calc non Af Amer 37 (*) >90 mL/min   GFR calc Af Amer 43 (*) >90 mL/min   CT Abdomen and Pelvis  Associated lower lobe opacities, likely atelectasis.  6.7 x 7.1 cm lower thoracic aortic aneurysm (series 2/image 11), previously 6.5 x 6.8 cm.  Bilateral renal vascular calcifications.   Prior sigmoid resection.  6.2 x 5.6 cm infrarenal aortic aneurysm, previously 4.8 x 4.5 cm, with aortobi-iliac grafting.   Small volume abdominopelvic ascites with presacral fluid.    These findings are most  compatible with extraperitoneal bladder rupture.   Findings compatible with extraperitoneal bladder rupture, as described above.  Foley catheter balloon is located within the TURP defect.  6.2 x 5.6 cm infrarenal aortic aneurysm, increased, with associated aortobi-iliac stent.  Adjacent inflammatory changes are favored to be related to the duodenum, although an inflammatory/mycotic aneurysm is not excluded by imaging.  6.7 x 7.1 cm lower thoracic aortic aneurysm, increased.    Dg Chest Port 1 View  12/05/2012   Prior sternotomy for CABG.  Cardiac silhouette upper normal in size to slightly enlarged but stable.  Left subclavian pacing defibrillator unchanged.  Suboptimal inspiration with atelectasis in the lung bases, right greater than left, new since yesterday.  Lungs otherwise clear.  IMPRESSION: Suboptimal inspiration accounts for bibasilar atelectasis, right greater than left.    EKG: 12/05/12  sinus rhythm; first-degree AV block; IVCD; low-voltage in limb leads.  Since previous tracing performed in 12/04/12, axis has shifted rightward and QRS interval has increased.  Physical Exam: Blood pressure 123/83, pulse 91, temperature 97.8 F (36.6 C), temperature source Oral, resp. rate 21, height 5\' 11"  (1.803 m), weight 156 lb 3.2 oz (70.852 kg), SpO2 100.00%.;  Body mass index is 21.8 kg/(m^2). General-Well-developed; no acute distress; mentally sharp HEENT-Cook/AT; PERRL; EOM intact; conjunctiva and lids nl;  Neck- normal Endocrine-No thyromegaly Lungs-few rhonchi and wheezes; resonant percussion; prolonged expiratory phase Cardiovascular- normal PMI; normal S1 and S2; grade 2/6 early systolic ejection murmur Abdomen-BS normal; soft and non-tender without masses or organomegaly;   Musculoskeletal-No deformities, cyanosis or clubbing Neurologic-Nl cranial nerves; symmetric strength and tone Skin- Warm, no significant lesions Extremities-Nl distal pulses; no edema  Assessment/Plan:  Paroxysmal  atrial fibrillation:   Acute non-ST segment elevation myocardial infarction: I have discussed his case with Dr. Excell Seltzer who also has consulted with Dr. Riley Kill ( pt's cardiologist for 20 years).  There is agreement that medical therapy for this NSTEMI is our best option.   He is not having any further CP .  Continue current meds   Respiratory failure: Patient believes he has returned to baseline; appears to be more respiratory than cardiac event with only mild to moderate elevation in BNP level and no pulmonary edema on chest x-ray.  His procalcitonin level is elevated.    Acute on chronic renal failure:    Ischemic cardiomyopathy:     Elyn Aquas., MD 12/09/2012, 7:36 AM

## 2012-12-09 NOTE — Progress Notes (Signed)
PT Cancellation Note  Patient Details Name: Roberto [name] MRN: [id number] DOB: Oct 16, 1935   Cancelled Treatment:    Reason Eval/Treat Not Completed: Other (comment) (pt just ambulated with RN)   Rada Hay 12/09/2012, 2:29 PM Blanchard Kelch PT [phone]

## 2012-12-09 NOTE — Progress Notes (Signed)
Pt converted from NSR to A-fib around 0400. HR remains 90s-118. Elink MD Deterding made aware.

## 2012-12-09 NOTE — Progress Notes (Signed)
At 0600 pt converted back into NSR HR 80s.

## 2012-12-09 NOTE — Progress Notes (Signed)
PULMONARY  / CRITICAL CARE MEDICINE  Name: Roberto [name] MRN: [id number] DOB: 08-25-1935 PCP Jeoffrey Massed, MD Pulm: Dr [name] Cards: Dr Stuckey/Dr Excell Seltzer [facility] : Dr Early    ADMISSION DATE:  12/04/2012 CONSULTATION DATE:  12/04/12  REFERRING MD :  Danie Binder, MD PRIMARY SERVICE: [facility]  CHIEF COMPLAINT:  Post op TURP, acute on chronic respiratory failure.  BRIEF   77 years old male with PMH relevant for ischemic cardiomyopathy,  COPD (sees Dr [name]) , Thoracic Aneurysm (large and inoperable) and  AAA (large and inperopable, given 1 year prognosis at [facility] 18 months ago) A. Fib on coumadin, AAA,  post CABG, post AICD, last echo with LVEF of 35%. He also has COPD with chronic respiratory failure, CKD, DM.  Presented electively for TURP 12/04/2012  morning. At about 2 pm he became SOB and hypoxic, rapid response was called and the patient was transferred to the SD unit under the Hospitalist service. Critical care consult called to assist with management. At the time of PCCM exam the patient is awake, alert, oriented x 3, with BP of 108/70, HR in the 50's and saturating 100% on 50% FiO2. His RR is 20 to 24/min. Of note, his rectal temperature was 92 F. He complains ob dull back pain likely from being in bed. He does have SOB but is improved compared to a couple of hours ago and states that he is feeling better. PCCM consulted 12/04/12 on admit date  LINES / TUBES: - Foley catheter - Peripheral IV's  CULTURES: BCX2 7/25>>>  ANTIBIOTICS: cipro 7/25>>>7/26 CTX 7/26>>>  SIGNIFICANT EVENTS / STUDIES:   - S/ p transurethral resection of prostate 12/04/12 by Mena Goes, admitted to ICU for close monitoring and acute COPD exacerbation. Also continuous bladder irrigation which required hand irrigation and catheter exchange late 7/25. Received 1upRBC 7/25.    12/04/12 Rapid response called and transferred to SD for hypoxemia and hypotension. - 12/05/12:  more dyspneic than baseline though  sitting and talking on phone. Worsening bicarb and creat noted. ABG with metabolic acidosis - new onset.  BP okay. Was febrile last night and then low temp but now normal temp  - 12/07/12: No events/ PRBC 1 unit. Aspirin and coumadin on hold. Contiuous bladder irrigation ended  SUBJECTIVE/OVERNIGHT/INTERVAL HX Feeling much better and in good spirits.   VITAL SIGNS: Temp:  [97.6 F (36.4 C)-98.8 F (37.1 C)] 97.6 F (36.4 C) (07/30 0800) Pulse Rate:  [75-116] 82 (07/30 1000) Resp:  [16-26] 22 (07/30 1000) BP: (107-143)/(51-110) 143/88 mmHg (07/30 0900) SpO2:  [91 %-100 %] 97 % (07/30 1000) FiO2 (%):  [21 %] 21 % (07/29 1224) Weight:  [70.852 kg (156 lb 3.2 oz)] 70.852 kg (156 lb 3.2 oz) (07/30 0326) 2 liters  HEMODYNAMICS:   VENTILATOR SETTINGS: Vent Mode:  [-]  FiO2 (%):  [21 %] 21 % INTAKE / OUTPUT: Intake/Output     07/29 0701 - 07/30 0700 07/30 0701 - 07/31 0700   P.O. 1320 720   I.V. (mL/kg) 240 (3.4) 30 (0.4)   Blood     Other     Total Intake(mL/kg) 1560 (22) 750 (10.6)   Urine (mL/kg/hr) 4875 (2.9) 1200 (3.4)   Stool     Total Output 4875 1200   Net -3315 -450        Stool Occurrence 2 x 1 x     PHYSICAL EXAMINATION: General: Looks better. Cachexia. Smiling. No distress ENT: Oropharynx clear. Dry mucous membranes. No thrush Lymph: No  cervical, supraclavicular, or axillary lymphadenopathy. Heart: Regular rhytm. No murmurs, rubs, or gallops appreciated. No bruits, equal pulses. Lungs: Diminished breath sounds bilaterally, without wheezes  Abdomen: Abdomen soft, non-tender and not distended, normoactive bowel sounds. No hepatosplenomegaly or masses. Musculoskeletal: No clubbing or synovitis. Skin: No rashes or lesions Neuro: No focal neurologic deficits. Awake and alert.  LABS: PULMONARY  Recent Labs Lab 12/04/12 1502 12/05/12 0900 12/05/12 1500 12/07/12 0334  PHART 7.442 7.203* 7.346* 7.454*  PCO2ART 27.2* 28.8* 26.7* 39.2  PO2ART 268.0* 104.0* 114.0*  74.3*  HCO3 18.3* 10.9* 14.2* 27.2*  TCO2 17.4 10.8 13.7 26.0  O2SAT 98.6 95.8 98.2 92.9  CBC  Recent Labs Lab 12/07/12 1710 12/08/12 0340 12/09/12 0335  HGB 9.0* 9.2* 8.7*  HCT 27.8* 29.0* 27.5*  WBC 12.7* 12.3* 7.7  PLT 99* 108* 98*    COAGULATION  Recent Labs Lab 12/04/12 0630 12/07/12 0335 12/08/12 0340 12/09/12 0335  INR 1.34 1.14 1.09 1.06    CARDIAC  Recent Labs Lab 12/05/12 1425 12/05/12 2030 12/06/12 0200 12/06/12 1017 12/08/12 0815  TROPONINI <0.30 0.60* 1.76* 5.56* 10.22*    Recent Labs Lab 12/05/12 0910 12/09/12 0335  PROBNP 695.8* 16919.0*     CHEMISTRY  Recent Labs Lab 12/06/12 0214 12/06/12 1020 12/07/12 0335 12/08/12 0340 12/09/12 0335  NA 138 141 139 144 140  K 3.7 3.4* 3.1* 3.9 4.0  CL 104 102 102 105 102  CO2 24 29 29  34* 34*  GLUCOSE 148* 177* 168* 120* 123*  BUN 31* 28* 29* 27* 23  CREATININE 2.01* 1.74* 1.48* 1.36* 1.18  CALCIUM 8.0* 8.5 8.5 8.9 8.8  MG  --   --   --   --  2.1  PHOS  --   --   --   --  3.0  LIVER  Recent Labs Lab 12/04/12 0630 12/05/12 0155 12/06/12 0214 12/07/12 0335 12/08/12 0340 12/09/12 0335  AST  --  214* 145*  --  77*  --   ALT  --  228* 175*  --  164*  --   ALKPHOS  --  53 42  --  61  --   BILITOT  --  0.3 0.1*  --  0.3  --   PROT  --  5.3* 4.9*  --  5.4*  --   ALBUMIN  --  2.6* 2.4*  --  2.7*  --   INR 1.34  --   --  1.14 1.09 1.06  INFECTIOUS  Recent Labs Lab 12/04/12 1520 12/05/12 0909 12/05/12 0910 12/06/12 0200 12/07/12 0335  LATICACIDVEN 5.6* 6.0*  --   --  2.6*  PROCALCITON  --   --  5.59 3.63 1.76  ENDOCRINE CBG (last 3)   Recent Labs  12/08/12 2352 12/09/12 0330 12/09/12 0746  GLUCAP 172* 120* 137*  IMAGING x48h  Dg Chest Port 1 View  12/08/2012   *RADIOLOGY REPORT*  Clinical Data: Pneumonia.  PORTABLE CHEST - 1 VIEW  Comparison: 12/05/2012.  Findings: Left subclavian AICD appears unchanged.  CABG/median sternotomy.  Cardiomegaly has increased compared to  prior exam. Mediastinal contours appears similar with aortic arch atherosclerosis.  The pulmonary vascular engorgement is present with cephalization of pulmonary blood flow.  Development of Kerley B lines at the periphery compatible with interstitial pulmonary edema.  There is also airspace disease at the bases, most compatible with CHF and alveolar edema. Superimposed infection is difficult to exclude but not favored. Lung volumes overall have improved despite the worsening edema.  IMPRESSION: 1.  Constellation of findings favor worsening CHF with interstitial and mild alveolar pulmonary edema.  Superimposed infection is not excluded but not favored. 2.  Improved lung volumes compared to prior.   Original Report Authenticated By: Andreas Newport, M.D.    ASSESSMENT / PLAN:  Acute blood loss anemia, and shock d/t bladder perf s/p TURP:  Now hgb holding  Recent Labs Lab 12/07/12 1710 12/08/12 0340 12/09/12 0335  HGB 9.0* 9.2* 8.7*  plan: - PRBC for hgb goal > 8gm% in view of NSTEMI -hold coumadin for now  - restarted asa  Acute on chronic respiratory failure due to copd with superimposed metabolic acidosis on 12/05/12 d/t shock-->>resolved possible AECOPD  Feels better. No wheeze 7/30 P:   -  pulmicort bid and duoneb q4h (alb neb wil help with high K)  - taper steroids  - Incentive spirometry - Continue supplemetal oxygen and titrate to goal O2 sat of 92%  Ischemic cardiomyopathy/Systolic CHF (Compensated) A fib, rate controlled  12/06/12: NSR and normal BP but now with NSTEMI. Appears euvolemic 12/08/12: Troponin peaked at 10. Cards says only med mgmt possible. ? A bit volume overloaded. In and out of A Fib and Sinus P:  - Continue amiodarone -  po aspirin - would advice against coumadin for a fib due to high risk of AAA rupture next few to several months  Enlarging AAA and thoracic aneurysm, both in-operable,  and life expectancy limiting P - monitor - counseled pt and family -  avoid coumadin   CKD, Baseline creat 1.5 -s/p TURP7/25/14 complicated 12/05/12 by bladder rupture (all day abd pain) and lactic acidosis. C/b small extraperitoneal bladder rupture  -12/08/12: s/p CBI ending 12/07/12. Creat better. Off bicarb. Uro recommending prn bladder irrigation -7/30 no issues.  P:   - to go to cephalexin prophylaxis  - foley for 3 weeks per Urology ffrom date of admit 12/04/2012 ; appreciate urology consult-->for f/u next week w/ cysto   DM P:   1) Novolog sliding scale     GLOBAL 12/06/12: Change primary to PCCM wit ICU status due to several complications. Family hold Dr [name] in super high regard. They have bee updated extensively about all of the above. Thy are still upset with [facility] in [place] who gave 1 year life expectancy some 12-18 months ago on account of AAA. They are aware of enlarging current AAA.   12/09/22: He is DNR. Dr [name] will address hospice eligibilit y and option at opd fu  12/10/22: Going to transfer him to ward. May be able to d/c to home in next day or so.  PETE BABCOCK NP 12/09/2012 11:55 AM   STAFF NOTE: BEtter. Will mov to floor. Will keep on pccm service.    Dr. Kalman Shan, M.D., [facility].C.P Pulmonary and Critical Care Medicine Staff Physician [facility] Pager: [phone], If no answer or between  15:00h - 7:00h: call [phone]  12/09/2012 11:27 PM

## 2012-12-09 NOTE — Progress Notes (Signed)
Patient ID: Roberto [name], male   DOB: [birth year], 77 y.o.   MRN: [id number]  Patient is in much better spirits today.  Abdomen soft and nontender Urine is clear with an old long stringy clot in the tubing I irrigated the Foley and 70 cc aliquots without difficulty. The urine is clear. There were no other clots. I got out what I put in.  A/P - Status post TURP with extraperitoneal bladder perforation  -Patient is stable from a urologic point of view -I will DC the Rocephin and start p.o. Cephalexin prophylaxis - followup next week in outpatient for cystogram and Foley removal.

## 2012-12-10 DIAGNOSIS — I4891 Unspecified atrial fibrillation: Secondary | ICD-10-CM

## 2012-12-10 LAB — CBC WITH DIFFERENTIAL/PLATELET
Basophils Absolute: 0 10*3/uL (ref 0.0–0.1)
HCT: 28 % — ABNORMAL LOW (ref 39.0–52.0)
Lymphocytes Relative: 8 % — ABNORMAL LOW (ref 12–46)
Neutro Abs: 7.5 10*3/uL (ref 1.7–7.7)
Platelets: 128 10*3/uL — ABNORMAL LOW (ref 150–400)
RDW: 16.8 % — ABNORMAL HIGH (ref 11.5–15.5)
WBC: 9.3 10*3/uL (ref 4.0–10.5)

## 2012-12-10 LAB — GLUCOSE, CAPILLARY
Glucose-Capillary: 148 mg/dL — ABNORMAL HIGH (ref 70–99)
Glucose-Capillary: 160 mg/dL — ABNORMAL HIGH (ref 70–99)

## 2012-12-10 LAB — BASIC METABOLIC PANEL
CO2: 32 mEq/L (ref 19–32)
Chloride: 101 mEq/L (ref 96–112)
Sodium: 140 mEq/L (ref 135–145)

## 2012-12-10 LAB — PROTIME-INR: INR: 1.05 (ref 0.00–1.49)

## 2012-12-10 MED ORDER — WARFARIN SODIUM 5 MG PO TABS: ORAL_TABLET | ORAL | Status: AC

## 2012-12-10 MED ORDER — METOPROLOL TARTRATE 1 MG/ML IV SOLN
5.0000 mg | Freq: Once | INTRAVENOUS | Status: AC
  Administered 2012-12-10: 5 mg via INTRAVENOUS
  Filled 2012-12-10: qty 5

## 2012-12-10 MED ORDER — CEPHALEXIN 500 MG PO CAPS: 500.0000 mg | ORAL_CAPSULE | Freq: Two times a day (BID) | ORAL | Status: DC

## 2012-12-10 MED ORDER — METHYLPREDNISOLONE 4 MG PO TABS: ORAL_TABLET | ORAL | Status: DC

## 2012-12-10 MED ORDER — ASPIRIN 81 MG PO CHEW: 81.0000 mg | CHEWABLE_TABLET | Freq: Every day | ORAL | Status: DC

## 2012-12-10 NOTE — Progress Notes (Signed)
Patient ID: Roberto [name], male   DOB: 1935/08/18, 77 y.o.   MRN: [id number]    Pt without complaints. Breathing well - no SOB.   Sitting on edge of bed eating breakfast.  Urine clear   Imp/plan - s/p TURP with extraperitoneal bladder perforation -  -can use leg bag when ambulating, overnight/large bag when sleeping  -f/u one week for cystogram  -cephalexin BID (Rx on chart)  -stable for d/c home from GU point of view

## 2012-12-10 NOTE — Progress Notes (Addendum)
Pt converted to a.fib. HR 100-130s. Asymptomatic. EKG performed. On - call critical care MD notified, no new orders received.  Earnest Conroy. Clelia Croft, RN

## 2012-12-10 NOTE — Progress Notes (Signed)
Pt converted back to SR.  Roberto [name]. Clelia Croft, RN

## 2012-12-10 NOTE — Care Management Note (Signed)
    Page 1 of 2   12/10/2012     11:53:48 AM   CARE MANAGEMENT NOTE 12/10/2012  Patient:  [place], SHELLHAMMER   Account Number:  [id number]  Date Initiated:  12/04/2012  Documentation initiated by:  Ezekiel Ina  Subjective/Objective Assessment:   pt admitted for TURP     Action/Plan:   plan to dc home   Anticipated DC Date:  12/10/2012   Anticipated DC Plan:  HOME/SELF CARE      DC Planning Services  CM consult      Choice offered to / List presented to:             Status of service:  Completed, signed off Medicare Important Message given?  NO (If response is "NO", the following Medicare IM given date fields will be blank) Date Medicare IM given:   Date Additional Medicare IM given:    Discharge Disposition:  HOME/SELF CARE  Per UR Regulation:  Reviewed for med. necessity/level of care/duration of stay  If discussed at Long Length of Stay Meetings, dates discussed:   12/10/2012    Comments:  12/10/12 Borden Thune RN,BSN NCM [id number] TRANSFER FROM SDU.D/C HOME TODAY NO D/C NEEDS OR ORDERS.  [id number] Earlene Plater, RN, BSN, CCN: [phone] Case management. Chart reviewed for discharge planning and present needs. Discharge needs: none present at time of review. Next chart review due:  [id number]     12/04/12 MMcGibboney, RN, BSN Chart reviewed.

## 2012-12-10 NOTE — Progress Notes (Signed)
Patient Name: Roberto [name]  MRN: [id number]  HPI: Roberto [name] is an 77 y.o. male referred for consultation by Dr.Murali Marchelle Gearing, MD for atrial fibrillation and acute myocardial infarction.  Roberto [name] has been followed by [facility] for the past 20 years. He has done well with moderately severe ischemic cardiomyopathy as well as extensive peripheral vascular disease. Consultation is now requested after a period of respiratory insufficiency following elective TURP. With appropriate intervention, the patient is now again compensated and free of significant cardiopulmonary symptoms.  He notes some residual left chest soreness/pressure that is constant, unrelated to respiration and unassociated with chest wall tenderness or any other symptoms.  He has had some episodes of AF but is currently in NSR.  Past Medical History  Diagnosis Date  . AAA (abdominal aortic aneurysm)     a. CTA 09/2011: 4.1-4.2cm infrarenal AAA.  Marland Kitchen Aneurysm of thoracic aorta 1995    a. Rupture 1995 w/spontaneous resolution;  b. CTA 09/2011: 6.8cm dist, 5.7cm mid  . Popliteal artery aneurysm   . Myocardial infarction   . Ischemic cardiomyopathy     10/2011 ECHO: EF 30-35%, apical akinesis  . Paroxysmal atrial fibrillation   . Hypertension   . Peripheral vascular disease   . Dyslipidemia   . Diabetes mellitus 2007    dx'd 2007 w/persisting FBS>125  . GERD (gastroesophageal reflux disease)   . Hiatal hernia   . Diverticular disease   . BPH (benign prostatic hypertrophy)     with hx of acute urinary retention (pt can self-cath)  . COPD (chronic obstructive pulmonary disease)     05/09/08 PFT FEV1 1.84 (55%), FVC 4.42 (95%), TLC 6.46 (92%), DLCO 72%, no BD response.  Overnight oximetry 10/2011 normal on RA.  Marland Kitchen Pneumonia 1/09  . Allergic rhinitis   . Shingles     X 2 episodes, both in left scapula area  . OSA (obstructive sleep apnea) 06/14/09    PSG RDI 17, PLMI 96, intolerant of CPAP or BPAP  . Chronic  renal insufficiency     Baseline Cr 1.5  . Prostate cancer 07/2008    Low grade; watchful waiting with [name]  . Pulmonary nodule, left 03/02/2010    Left base; resolved on f/u CT  . Allergic rhinitis   . Carpal tunnel syndrome of left wrist 02/11/2011  . IMPLANTATION OF DEFIBRILLATOR, HX OF 09/02/2008    Medtronic 7232Cx Maximo V. Had a 6949 lead - failed and therapies turned off.  . IRON DEFICIENCY 06/08/2010    Hemoccult negative:  GI ([name]) recommended no invasive diagnostics--watchful waiting (2012).  . UNSPECIFIED ANEMIA 06/01/2010    Multifactorial: iron def, CRI, vit B12 def,   . Hypotension 04/14/2012  . CHF (congestive heart failure)   . Atrial fibrillation   . CAD (coronary artery disease)     post CABG with prior percutaneous intervention of the saphenous vein graft with known saphenous vein graft disease.  . Automatic implantable cardioverter-defibrillator in situ     turned off per office visit note of Dr Excell Seltzer   . Hypothyroidism   . Shortness of breath   . Arthritis     Past Surgical History  Procedure Laterality Date  . Cardiac defibrillator placement  08/28/2004    [name]  . Colon surgery  1995    partial colectomy  . Coronary artery bypass graft  1996  . Aorta - bilateral femoral artery bypass graft  1999    [name] Early  .  Nose surgery    . Insertion of pacing lead      New rate sensing pacing lead with removal of a previous implanted ICD and insertion of a device back in the pocket with defibrillation threshold testing  . Abdominal aortic aneurysm repair  1999    Ingrarenal repair/ graft  . Stents      Placed to the saphenous vein graft  . Radiofrequency ablation  2006    A flutter  . Esophagogastroduodenoscopy (egd) with esophageal dilation  05/28/2012    Procedure: ESOPHAGOGASTRODUODENOSCOPY (EGD) WITH ESOPHAGEAL DILATION;  Surgeon: Rachael Fee, MD;  Location: [facility];  Service: Endoscopy;  Laterality: N/A;  . Coronary  angioplasty    . Coronary stents     . Transurethral resection of prostate N/A 12/04/2012    Procedure: BIPOLAR TRANSURETHRAL RESECTION OF THE PROSTATE WITH GYRUS INSTRUMENTS;  Surgeon: Antony Haste, MD;  Location: [facility];  Service: Urology;  Laterality: N/A;    Family History  Problem Relation Age of Onset  . Stroke Mother 54  . Heart attack Mother     CVA  . Cancer Father 37    Lung  . Heart attack Father 41  . Lung cancer Father   . Coronary artery disease Other     2 of 5 siblings with CAD   Social History:  reports that he quit smoking about 9 years ago. His smoking use included Cigarettes. He has a 30 pack-year smoking history. He quit smokeless tobacco use about 9 years ago. His smokeless tobacco use included Chew. He reports that he drinks about 0.6 ounces of alcohol per week. He reports that he does not use illicit drugs.  Allergies:  Allergies  Allergen Reactions  . Atorvastatin     REACTION: muscle ache in legs  . Crestor (Rosuvastatin Calcium) Other (See Comments)    Lower ext fatigue and soreness- if taken everyday  . Dilaudid (Hydromorphone Hcl)     "drunk"  . Lisinopril Diarrhea  . Morphine     REACTION: AMS - agitation  . Prednisone     REACTION: Can't breath  . Trazodone And Nefazodone Other (See Comments)    "drunk"  . Zolpidem Tartrate     REACTION: AMS   Medications:  I have reviewed the patient's current medications. Scheduled: . amiodarone  200 mg Oral q morning - 10a  . antiseptic oral rinse  15 mL Mouth Rinse q12n4p  . arformoterol  15 mcg Nebulization BID  . aspirin  81 mg Oral Daily  . bisacodyl  5 mg Oral Daily  . budesonide  0.25 mg Nebulization BID  . cephALEXin  500 mg Oral BID  . [START ON 12/11/2012] cyanocobalamin  1,000 mcg Intramuscular Q30 days  . feeding supplement  237 mL Oral TID BM  . finasteride  5 mg Oral QHS  . furosemide  20 mg Intravenous Daily  . insulin aspart  0-15 Units Subcutaneous TID WC  . isosorbide  mononitrate  60 mg Oral Daily  . levothyroxine  125 mcg Oral Custom  . levothyroxine  62.5 mcg Oral Custom  . linagliptin  5 mg Oral Daily  . methylPREDNISolone  32 mg Oral Q breakfast  . mirtazapine  15 mg Oral QHS  . pantoprazole  40 mg Oral Daily  . polyethylene glycol  17 g Oral Daily  . senna-docusate  2 tablet Oral BID  . tamsulosin  0.8 mg Oral q morning - 10a  . tiotropium  18 mcg Inhalation Daily    .  BLOOD GAS, ARTERIAL     Status: Abnormal   Collection Time    12/05/12  3:00 PM      Result Value Range   O2 Content 4.0     Delivery systems NASAL CANNULA     pH, Arterial 7.346 (*) 7.350 - 7.450   pCO2 arterial 26.7 (*) 35.0 - 45.0 mmHg   pO2, Arterial 114.0 (*) 80.0 - 100.0 mmHg   Bicarbonate 14.2 (*) 20.0 - 24.0 mEq/L   TCO2 13.7  0 - 100 mmol/L   Acid-base deficit 10.1 (*) 0.0 - 2.0 mmol/L   O2 Saturation 98.2     Patient temperature 98.6     Collection site LEFT RADIAL     Drawn by [id number]     Sample type ARTERIAL DRAW     Allens test (pass/fail) PASS  PASS  TROPONIN I     Status: Abnormal   Collection Time    12/06/12  2:00 AM      Result Value Range   Troponin I 1.76 (*) <0.30 ng/mL  COMPREHENSIVE METABOLIC PANEL     Status: Abnormal   Collection Time    12/06/12  2:14 AM      Result Value Range   Sodium 138  135 - 145 mEq/L   Potassium 3.7  3.5 - 5.1 mEq/L   Chloride 104  96 - 112 mEq/L   CO2 24  19 - 32 mEq/L   Glucose, Bld 148 (*) 70 - 99 mg/dL   BUN 31 (*) 6 - 23 mg/dL   Creatinine, Ser 1.19 (*) 0.50 - 1.35 mg/dL   Calcium 8.0 (*) 8.4 - 10.5 mg/dL   Total Protein 4.9 (*) 6.0 - 8.3 g/dL   Albumin 2.4 (*) 3.5 - 5.2 g/dL   AST 147 (*) 0 - 37 U/L   ALT 175 (*) 0 - 53 U/L   Alkaline Phosphatase 42  39 - 117 U/L   Total Bilirubin 0.1 (*) 0.3 - 1.2 mg/dL   GFR calc non Af Amer 31 (*) >90 mL/min   GFR calc Af Amer 36 (*) >90 mL/min  CBC     Status: Abnormal   Collection Time    12/06/12  2:14 AM      Result Value Range   WBC 12.4 (*) 4.0 - 10.5  K/uL   RBC 2.16 (*) 4.22 - 5.81 MIL/uL   Hemoglobin 6.2 (*) 13.0 - 17.0 g/dL   Comment: REPEATED TO VERIFY     CRITICAL RESULT CALLED TO, READ BACK BY AND VERIFIED WITH:     CALABRIGHT RN AT 0250 ON [id number] BY DLONG     DELTA CHECK NOTED   HCT 18.8 (*) 39.0 - 52.0 %   MCV 87.0  78.0 - 100.0 fL   MCH 27.8  26.0 - 34.0 pg   MCHC 31.9  30.0 - 36.0 g/dL   RDW 13.0  86.5 - 78.4 %      Result Value Range   Troponin I 5.56 (*) <0.30 ng/mL  CBC     Status: Abnormal   Collection Time    12/06/12 10:17 AM      Result Value Range   WBC 13.2 (*) 4.0 - 10.5 K/uL   RBC 3.14 (*) 4.22 - 5.81 MIL/uL   Hemoglobin 8.7 (*) 13.0 - 17.0 g/dL   Comment: POST TRANSFUSION SPECIMEN   HCT 26.8 (*) 39.0 - 52.0 %   MCV 85.4  78.0 - 100.0 fL   MCH 27.7  26.0 -  34.0 pg   MCHC 32.5  30.0 - 36.0 g/dL   RDW 27.2 (*) 53.6 - 64.4 %   Platelets 107 (*) 150 - 400 K/uL  BASIC METABOLIC PANEL     Status: Abnormal   Collection Time    12/06/12 10:20 AM      Result Value Range   Sodium 141  135 - 145 mEq/L   Potassium 3.4 (*) 3.5 - 5.1 mEq/L   Chloride 102  96 - 112 mEq/L   CO2 29  19 - 32 mEq/L   Glucose, Bld 177 (*) 70 - 99 mg/dL   BUN 28 (*) 6 - 23 mg/dL   Creatinine, Ser 0.34 (*) 0.50 - 1.35 mg/dL   Calcium 8.5  8.4 - 74.2 mg/dL   GFR calc non Af Amer 37 (*) >90 mL/min   GFR calc Af Amer 43 (*) >90 mL/min   CT Abdomen and Pelvis  Associated lower lobe opacities, likely atelectasis.  6.7 x 7.1 cm lower thoracic aortic aneurysm (series 2/image 11), previously 6.5 x 6.8 cm.  Bilateral renal vascular calcifications.   Prior sigmoid resection.  6.2 x 5.6 cm infrarenal aortic aneurysm, previously 4.8 x 4.5 cm, with aortobi-iliac grafting.   Small volume abdominopelvic ascites with presacral fluid.    These findings are most compatible with extraperitoneal bladder rupture.   Findings compatible with extraperitoneal bladder rupture, as described above.  Foley catheter balloon is located within the TURP defect.  6.2 x  5.6 cm infrarenal aortic aneurysm, increased, with associated aortobi-iliac stent.  Adjacent inflammatory changes are favored to be related to the duodenum, although an inflammatory/mycotic aneurysm is not excluded by imaging.  6.7 x 7.1 cm lower thoracic aortic aneurysm, increased.    Dg Chest Port 1 View  12/05/2012   Prior sternotomy for CABG.  Cardiac silhouette upper normal in size to slightly enlarged but stable.  Left subclavian pacing defibrillator unchanged.  Suboptimal inspiration with atelectasis in the lung bases, right greater than left, new since yesterday.  Lungs otherwise clear.  IMPRESSION: Suboptimal inspiration accounts for bibasilar atelectasis, right greater than left.     tele:  NSR this am.  Physical Exam: Blood pressure 121/71, pulse 100, temperature 98.3 F (36.8 C), temperature source Oral, resp. rate 16, height 6\' 2"  (1.88 m), weight 152 lb 6.4 oz (69.128 kg), SpO2 98.00%.;  Body mass index is 19.56 kg/(m^2). General-Well-developed; no acute distress; mentally sharp HEENT-[place]/AT; PERRL; EOM intact; conjunctiva and lids nl;  Neck- normal Lungs-very few scattered wheezes.  Cardiovascular- normal PMI; normal S1 and S2; grade 2/6 early systolic ejection murmur Abdomen-BS normal; soft and non-tender without masses or organomegaly;   Musculoskeletal-No deformities, cyanosis or clubbing Neurologic-Nl cranial nerves; symmetric strength and tone Skin- Warm, no significant lesions Extremities-Nl distal pulses; no edema  Assessment/Plan:  Paroxysmal atrial fibrillation:   Acute non-ST segment elevation myocardial infarction: I have discussed his case with Dr. Excell Seltzer who also has consulted with Dr. Riley Kill ( pt's cardiologist for 20 years).  There is agreement that medical therapy for this NSTEMI is our best option.   He is not having any further CP .  Continue current meds  Paroxysmal Atrial Fibrillation:  He continues to have PAF.   He still has some very mild hematuria.   Re-start coumadin and low dose ASA when urology thinks it's safe from a bleeding standpoint.  Continue amiodarone at current dose.   Respiratory failure: Patient believes he has returned to baseline; appears to be more respiratory than  cardiac event with only mild to moderate elevation in BNP level and no pulmonary edema on chest x-ray.       Acute on chronic renal failure:    Ischemic cardiomyopathy:     Elyn Aquas., MD 12/10/2012, 7:46 AM

## 2012-12-10 NOTE — Progress Notes (Signed)
Pt's HR sustaining 120-130s, had 3 beats of V-tach. On call critical care MD notified. New orders received.  Earnest Conroy. Clelia Croft, RN

## 2012-12-10 NOTE — Evaluation (Signed)
Physical Therapy Evaluation Patient Details Name: Roberto [name] MRN: [id number] DOB: [birth year] Today's Date: 12/10/2012 Time: 2440-1027 PT Time Calculation (min): 18 min  PT Assessment / Plan / Recommendation History of Present Illness  Pt with prostate cancer s/p tranurethral resection of prostate.  History of cardiac issues such as afib, MI and cardiomyopathy.  Clinical Impression  Pt is overall min/guard for all mobility tasks, however does demonstrate decreased balance and intermittent LOB during ambulation, esp when having to negotiate objects in hallway.  Pt very "strong minded" and states "of course I am unsteady, I've been in the bed for seven days."  Educated on safety with SPC vs rollator at home (pt refuses to use RW at home).  Pt will benefit from skilled PT in acute venue to address deficits.  PT recommends HHPT for follow up at D/C to maximize pts safety and function.     PT Assessment  Patient needs continued PT services    Follow Up Recommendations  Home health PT    Does the patient have the potential to tolerate intense rehabilitation      Barriers to Discharge        Equipment Recommendations  None recommended by PT    Recommendations for Other Services     Frequency Min 3X/week    Precautions / Restrictions Precautions Precautions: Fall Restrictions Weight Bearing Restrictions: No   Pertinent Vitals/Pain No pain stated.       Mobility  Bed Mobility Bed Mobility: Not assessed Details for Bed Mobility Assistance: Pt in recliner when PT arrived Transfers Transfers: Sit to Stand;Stand to Sit Sit to Stand: 5: Supervision;With armrests;From chair/3-in-1 Stand to Sit: 5: Supervision;To chair/3-in-1;With armrests Details for Transfer Assistance: Supervision and max cues for safety as pt is somewhat impulsive to stand and move without therapist being ready or paying attention to catheter still attached to chair.   Ambulation/Gait Ambulation/Gait  Assistance: 4: Min guard;4: Min Environmental consultant (Feet): 300 Feet Assistive device: Rolling walker;Straight cane Ambulation/Gait Assistance Details: Pt initially ambulated with SPC at min/guard to min assist for intermittent LOB and difficulty negotiating around objects causing feet to get tangled.  Max cues for safety and technique.  Transitioned to using RW, however pt feels that it was pulling in different directions and states he would rather use his rollator from home.   Gait Pattern: Step-through pattern;Decreased stride length;Narrow base of support Gait velocity: increased at times with cues for decreased speed for safety.  Stairs: Yes Stairs Assistance: 4: Min guard Stair Management Technique: One rail Right;With cane Number of Stairs: 10 Wheelchair Mobility Wheelchair Mobility: No    Exercises     PT Diagnosis: Difficulty walking;Generalized weakness  PT Problem List: Decreased strength;Decreased activity tolerance;Decreased balance;Decreased mobility;Decreased coordination;Decreased knowledge of use of DME;Decreased safety awareness PT Treatment Interventions: DME instruction;Gait training;Stair training;Functional mobility training;Therapeutic activities;Therapeutic exercise;Balance training;Patient/family education     PT Goals(Current goals can be found in the care plan section) Acute Rehab PT Goals Patient Stated Goal: to return home as soon as possible PT Goal Formulation: With patient Time For Goal Achievement: 12/17/12 Potential to Achieve Goals: Good  Visit Information  Last PT Received On: 12/10/12 Assistance Needed: +1 History of Present Illness: Pt with prostate cancer s/p tranurethral resection of prostate.  History of cardiac issues such as afib, MI and cardiomyopathy.       Prior Functioning  Home Living Family/patient expects to be discharged to:: Private residence Living Arrangements: Spouse/significant other Available Help at Discharge:  Family;Available  PRN/intermittently Type of Home: House Home Access: Stairs to enter [employer] of Steps: 3 Entrance Stairs-Rails: Right Home Layout: Two level;Able to live on main level with bedroom/bathroom Home Equipment: Cane - single point;Grab bars - tub/shower Prior Function Level of Independence: Independent Communication Communication: No difficulties    Cognition  Cognition Arousal/Alertness: Awake/alert Behavior During Therapy: WFL for tasks assessed/performed Overall Cognitive Status: Within Functional Limits for tasks assessed    Extremity/Trunk Assessment Lower Extremity Assessment Lower Extremity Assessment: Generalized weakness Cervical / Trunk Assessment Cervical / Trunk Assessment: Normal   Balance    End of Session PT - End of Session Equipment Utilized During Treatment: Gait belt Activity Tolerance: Patient limited by fatigue Patient left: in chair;with call bell/phone within reach;with chair alarm set Nurse Communication: Mobility status  GP     Vista Deck 12/10/2012, 9:42 AM

## 2012-12-10 NOTE — Discharge Summary (Signed)
Physician Discharge Summary     Patient ID: Roberto [name] MRN: [id number] DOB/AGE: 75-Jul-1937 75 y.o.  Admit date: 12/04/2012 Discharge date: 12/10/2012  Discharge Diagnoses:  Acute on chronic respiratory failure due to COPD with superimposed metabolic acidosis in setting of shock (resolved) Acute exacerbation of COPD.  Ischemic Cardiomyopathy NSTEMI Atrial fibrillation (paroxysmal) Enlarging AAA and thoracic aneurysm CKD stage 2 Bladder rupture s/p TURP DM Acute blood loss anemia   Detailed Hospital Course:   77 years old male with PMH relevant for ischemic cardiomyopathy, A. Fib on coumadin, AAA, post CABG, post AICD, last echo with LVEF of 35%. He also has COPD with chronic respiratory failure, CKD, DM. Presented electively for TURP on 7/26. At about 2 pm he became SOB and hypoxic, rapid response was called and the patient was transferred to the SD unit under the Hospitalist service. Critical care consult called to assist with management. At the time of initial exam the patient was awake, alert, oriented x 3, with BP of 108/70, HR in the 50's and saturating 100% on 50% FiO2. His RR is 20 to 24/min. Of note, his rectal temperature was 92 F. He complained of dull back pain likely from being in bed.    Dx eval demonstrated new metabolic acidosis with elevated anion gap and new blood loss. He did report increased wheezing and WOB,. He was emergently transfused and fluid resuscitated, given oxygen, bronchodilators and systemic steroids. His Coumadin and aspirin were stopped.  Further diagnostic eval  Showed very small extraperitoneal rupture noted by CT cystogram 7/26 on w/u of continued abdominal pain. No large volume intraabdominal fluid or fluid collections. Additional issues identified were as follows: 1) increase in size of infra-renal AA and increase in thoracic aneurysm already deemed not surgically a candidate. 2) elevated cardiac enzymes, NSTEMI and PAF. Cardiology consultation was  obtained.  It was felt that he should be treated medically for his NSTEMI.   Over the course of his stay his work of breathing improved. His hgb eventually stabilized and he was deemed ready for d/c as of 7/31. At time of d/c he still has some faint hematuria so will wait on resuming coumadin. He is to follow up with urology. When he no longer has hematuria he can resume his coumadin at home dosing.       Discharge Plan by diagnoses   Acute blood loss anemia, and shock d/t bladder perf s/p TURP:  Now hgb holding   Recent Labs Lab 12/08/12 0340 12/09/12 0335 12/10/12 0445  HGB 9.2* 8.7* 9.0*  plan:  -hold coumadin for now -->can resume this after his cystogram assuming that his hematuria has resolved - restarted asa  Acute on chronic respiratory failure due to copd with superimposed metabolic acidosis on 12/05/12 d/t shock-->>resolved  possible AECOPD  Feels better. No wheeze 7/30  P:  - home on his regular routine BDs - taper steroids  - Incentive spirometry  - Continue supplemetal oxygen and titrate to goal O2 sat of 92%  Ischemic cardiomyopathy/Systolic CHF (Compensated)  A fib, rate controlled  NSTEMI P:  - Continue amiodarone  - po aspirin  - could consider resuming coumadin after hematuria resolved   Enlarging AAA and thoracic aneurysm, both in-operable, and life expectancy limiting  P  - monitor  - counseled pt and family  CKD, Baseline creat 1.5  -s/p TURP7/25/14 complicated 12/05/12 by bladder rupture (all day abd pain) and lactic acidosis. C/b small extraperitoneal bladder rupture  P:  - to go  to cephalexin prophylaxis  - foley for 3 weeks per Urology ffrom date of admit 12/04/2012  - f/u next week w/ cysto    SIGNIFICANT EVENTS / STUDIES:  - S/ p transurethral resection of prostate 12/04/12 by Mena Goes, admitted to ICU for close monitoring and acute COPD exacerbation. Also continuous bladder irrigation which required hand irrigation and catheter exchange late  7/25. Received 1upRBC 7/25.  12/04/12 Rapid response called and transferred to SD for hypoxemia and hypotension. - 12/05/12: more dyspneic than baseline though sitting and talking on phone. Worsening bicarb and creat noted. ABG with metabolic acidosis - new onset. BP okay. Was febrile last night and then low temp but now normal temp  - 12/07/12: No events/ PRBC 1 unit. Aspirin and coumadin on hold. Contiuous bladder irrigation ended  Consults Urology and cardiology   Discharge Exam: BP 121/71  Pulse 100  Temp(Src) 98.3 F (36.8 C) (Oral)  Resp 16  Ht 6\' 2"  (1.88 m)  Wt 69.128 kg (152 lb 6.4 oz)  BMI 19.56 kg/m2  SpO2 98%  PHYSICAL EXAMINATION:  General: Looks better. Cachexia. Smiling. No distress  ENT: Oropharynx clear. Dry mucous membranes. No thrush  Lymph: No cervical, supraclavicular, or axillary lymphadenopathy.  Heart: Regular rhytm. No murmurs, rubs, or gallops appreciated. No bruits, equal pulses.  Lungs: Diminished breath sounds bilaterally, without wheezes  Abdomen: Abdomen soft, non-tender and not distended, normoactive bowel sounds. No hepatosplenomegaly or masses.  Musculoskeletal: No clubbing or synovitis.  Skin: No rashes or lesions  Neuro: No focal neurologic deficits. Awake and alert.   Labs at discharge Lab Results  Component Value Date   CREATININE 1.19 12/10/2012   BUN 27* 12/10/2012   NA 140 12/10/2012   K 4.1 12/10/2012   CL 101 12/10/2012   CO2 32 12/10/2012   Lab Results  Component Value Date   WBC 9.3 12/10/2012   HGB 9.0* 12/10/2012   HCT 28.0* 12/10/2012   MCV 88.9 12/10/2012   PLT 128* 12/10/2012   Lab Results  Component Value Date   ALT 164* 12/08/2012   AST 77* 12/08/2012   ALKPHOS 61 12/08/2012   BILITOT 0.3 12/08/2012   Lab Results  Component Value Date   INR 1.05 12/10/2012   INR 1.06 12/09/2012   INR 1.09 12/08/2012   PROTIME 17.1 10/19/2008    Current radiology studies No results found.  Disposition:  01-Home or Self Care        Discharge Orders   Future Appointments Provider Department Dept Phone   12/11/2012 3:00 PM [facility] [phone]   12/25/2012 11:30 AM Julio Sicks, NP [facility] [phone]   01/05/2013 3:00 PM Tonny Bollman, MD [facility] Main Office [place]) [phone]   02/05/2013 2:00 PM Coralyn Helling, MD  [facility] [phone]   Future Orders Complete By Expires     Diet - low sodium heart healthy  As directed     Discharge instructions  As directed     Comments:      Call urology if note more bleeding    Increase activity slowly  As directed         Medication List    STOP taking these medications       sulfamethoxazole-trimethoprim 800-160 MG per tablet  Commonly known as:  BACTRIM DS      TAKE these medications       acetaminophen 500 MG tablet  Commonly known as:  TYLENOL  Take 1,000 mg by  mouth every 6 (six) hours as needed. For headache, pain or fever.     albuterol (2.5 MG/3ML) 0.083% nebulizer solution  Commonly known as:  PROVENTIL  Take 3 mLs (2.5 mg total) by nebulization 4 (four) times daily as needed. Dx 496     amiodarone 200 MG tablet  Commonly known as:  PACERONE  Take 1 tablet (200 mg total) by mouth every morning.     aspirin 81 MG chewable tablet  Chew 1 tablet (81 mg total) by mouth daily.     aspirin EC 81 MG tablet  Take 81 mg by mouth every morning.     budesonide 0.25 MG/2ML nebulizer solution  Commonly known as:  PULMICORT  Take 2 mLs (0.25 mg total) by nebulization 2 (two) times daily.     carvedilol 3.125 MG tablet  Commonly known as:  COREG  Take 3.125 mg by mouth 2 (two) times daily with a meal.     cephALEXin 500 MG capsule  Commonly known as:  KEFLEX  Take 1 capsule (500 mg total) by mouth 2 (two) times daily.     cetirizine 10 MG tablet  Commonly known as:  ZYRTEC  Take 10 mg by mouth every morning.     cyanocobalamin 1000 MCG/ML injection  Commonly  known as:  (VITAMIN B-12)  Inject 1,000 mcg into the muscle every 30 (thirty) days.     finasteride 5 MG tablet  Commonly known as:  PROSCAR  Take 5 mg by mouth at bedtime.     FLOMAX 0.4 MG Caps  Generic drug:  tamsulosin  Take 0.8 mg by mouth every morning.     formoterol 20 MCG/2ML nebulizer solution  Commonly known as:  PERFOROMIST  Take 2 mLs (20 mcg total) by nebulization 2 (two) times daily.     furosemide 40 MG tablet  Commonly known as:  LASIX  Take 40 mg by mouth every morning.     hyoscyamine 0.125 MG SL tablet  Commonly known as:  LEVSIN SL  Place 1 tablet (0.125 mg total) under the tongue every 4 (four) hours as needed for cramping.     isosorbide mononitrate 60 MG 24 hr tablet  Commonly known as:  IMDUR  Take 60 mg by mouth every morning.     levalbuterol 45 MCG/ACT inhaler  Commonly known as:  XOPENEX HFA  Inhale 1-2 puffs into the lungs every 4 (four) hours as needed for wheezing or shortness of breath.     levothyroxine 125 MCG tablet  Commonly known as:  SYNTHROID, LEVOTHROID  Take 0.5-1 tablets (62.5-125 mcg total) by mouth every other day. Alternate daily with a whole tablet ( ) or a half a tablet (6.67mcg)     methylPREDNISolone 4 MG tablet  Commonly known as:  MEDROL  Take 7 tab po qdx1d then 6 tab po qd x 1d, then 5 tab po qd x 1 d, then 4 tab po qdx1d, then 3 tab po qdx1d, then 2 tab po qd x1 d then 1 tab po qdx1 d and d/c     mirtazapine 15 MG tablet  Commonly known as:  REMERON  Take 15 mg by mouth at bedtime.     nitroGLYCERIN 0.3 MG SL tablet  Commonly known as:  NITROSTAT  Place 1 tablet (0.3 mg total) under the tongue every 5 (five) minutes as needed. For chest pain     omeprazole 20 MG tablet  Commonly known as:  PRILOSEC OTC  Take 20 mg by mouth  daily.     potassium chloride 10 MEQ tablet  Commonly known as:  K-DUR,KLOR-CON  Take 10 mEq by mouth daily.     sitaGLIPtin 100 MG tablet  Commonly known as:  JANUVIA  Take 100 mg  by mouth daily.     tiotropium 18 MCG inhalation capsule  Commonly known as:  SPIRIVA  Place 18 mcg into inhaler and inhale daily.     warfarin 5 MG tablet  Commonly known as:  COUMADIN  Takes 1/2 tablet every day except Monday he takes 1 tablet-->HOLD until no further blood in urine.       Follow-up Information   Follow up with Mena Goes Lowella Petties, MD In 1 week.   Contact information:   [address] [place] [address] [phone]       Follow up with SOOD,VINEET, MD On 02/05/2013. (2pm)    Contact information:   [address] [place] [address] [phone]       Follow up with PARRETT,TAMMY, NP On 12/25/2012. (1130)    Contact information:   [address] [place] [address] [phone]       Discharged Condition: good  Signed: BABCOCK,PETE 12/10/2012, 11:33 AM  Physician Statement:   The Patient was personally examined, the discharge assessment and plan has been personally reviewed and I agree with ACNP Babcock's assessment and plan. > 30 minutes of time have been dedicated to discharge assessment, planning and discharge instructions.   Talton Delpriore V.

## 2012-12-11 ENCOUNTER — Telehealth: Payer: Self-pay | Admitting: Pulmonary Disease

## 2012-12-11 LAB — CULTURE, BLOOD (ROUTINE X 2): Culture: NO GROWTH

## 2012-12-11 NOTE — Telephone Encounter (Signed)
I spoke with pt. He stated he has 2 aspirin's on his medlist. He stated he only takes 1. He needs to make sure he is doing this correctly. I advised pt it was a duplicate and he is correct to take ASAP 81 MG daily. Nothing further was needed

## 2012-12-22 ENCOUNTER — Telehealth: Payer: Self-pay | Admitting: Family Medicine

## 2012-12-22 MED ORDER — MIRTAZAPINE 15 MG PO TABS: 15.0000 mg | ORAL_TABLET | Freq: Every day | ORAL | Status: DC

## 2012-12-22 MED ORDER — FUROSEMIDE 40 MG PO TABS: 40.0000 mg | ORAL_TABLET | Freq: Every morning | ORAL | Status: DC

## 2012-12-22 NOTE — Telephone Encounter (Signed)
I sent in 30 day supply of medications requested by patient without refills.  Patient is over due for a follow up.

## 2012-12-24 ENCOUNTER — Encounter: Payer: Self-pay | Admitting: Family Medicine

## 2012-12-24 ENCOUNTER — Ambulatory Visit (INDEPENDENT_AMBULATORY_CARE_PROVIDER_SITE_OTHER): Payer: Medicare Other | Admitting: Family Medicine

## 2012-12-24 ENCOUNTER — Telehealth: Payer: Self-pay | Admitting: Cardiovascular Disease

## 2012-12-24 VITALS — BP 110/64 | HR 66 | Temp 98.5°F | Ht 70.0 in | Wt 149.8 lb

## 2012-12-24 DIAGNOSIS — N189 Chronic kidney disease, unspecified: Secondary | ICD-10-CM

## 2012-12-24 DIAGNOSIS — I4891 Unspecified atrial fibrillation: Secondary | ICD-10-CM

## 2012-12-24 DIAGNOSIS — I480 Paroxysmal atrial fibrillation: Secondary | ICD-10-CM

## 2012-12-24 DIAGNOSIS — D62 Acute posthemorrhagic anemia: Secondary | ICD-10-CM

## 2012-12-24 DIAGNOSIS — I2589 Other forms of chronic ischemic heart disease: Secondary | ICD-10-CM

## 2012-12-24 DIAGNOSIS — I214 Non-ST elevation (NSTEMI) myocardial infarction: Secondary | ICD-10-CM

## 2012-12-24 DIAGNOSIS — N179 Acute kidney failure, unspecified: Secondary | ICD-10-CM

## 2012-12-24 DIAGNOSIS — I219 Acute myocardial infarction, unspecified: Secondary | ICD-10-CM

## 2012-12-24 DIAGNOSIS — J449 Chronic obstructive pulmonary disease, unspecified: Secondary | ICD-10-CM

## 2012-12-24 LAB — CBC WITH DIFFERENTIAL/PLATELET
Eosinophils Relative: 1 % (ref 0–5)
Lymphocytes Relative: 15 % (ref 12–46)
Lymphs Abs: 1.4 10*3/uL (ref 0.7–4.0)
MCH: 27 pg (ref 26.0–34.0)
MCV: 84.7 fL (ref 78.0–100.0)
Monocytes Relative: 7 % (ref 3–12)
Platelets: 281 10*3/uL (ref 150–400)

## 2012-12-24 LAB — PROTIME-INR
INR: 0.99 (ref <1.50)
Prothrombin Time: 13.1 seconds (ref 11.6–15.2)

## 2012-12-24 LAB — BASIC METABOLIC PANEL
BUN: 34 mg/dL — ABNORMAL HIGH (ref 6–23)
Creat: 1.36 mg/dL — ABNORMAL HIGH (ref 0.50–1.35)

## 2012-12-24 NOTE — Progress Notes (Signed)
OFFICE VISIT  12/28/2012   CC:  Chief Complaint  Patient presents with  . Follow-up     HPI:    Patient is a 77 y.o. Caucasian male who presents for ongoing fatigue and DOE ever since d/c home from hospitalization 2 wks ago (7/25-7/31, 2014).  Hosp records reviewed: TURP, then post-op course complicated by acute blood loss, hypotension, acute hypoxic resp failure, bladder perforation, NSTEMI, and brief periods in a-fib w/RVR.   With the c/o fatigue and DOE, he denies change in cough, sputum production, or wheezing.  No fevers. Rare palpitations.   Has had urologic f/u x 1, was still having some blood tinged urine at that time, unsure if perf was healed, foley left in, has not had blood visible in urine in the last 1 wk.    ROS: appetite ok, but early satiety noted.  No chest pain.  No dizziness, no headache, no LE edema, no melena or hematochezia.  NO abd pain.  Past Medical History  Diagnosis Date  . AAA (abdominal aortic aneurysm)     a. CTA 09/2011: 4.1-4.2cm infrarenal AAA.  Marland Kitchen Aneurysm of thoracic aorta 1995    a. Rupture 1995 w/spontaneous resolution;  b. CTA 09/2011: 6.8cm dist, 5.7cm mid  . Popliteal artery aneurysm   . Myocardial infarction   . Ischemic cardiomyopathy     10/2011 ECHO: EF 30-35%, apical akinesis  . Paroxysmal atrial fibrillation   . Hypertension   . Peripheral vascular disease   . Dyslipidemia   . Diabetes mellitus 2007    dx'd 2007 w/persisting FBS>125  . GERD (gastroesophageal reflux disease)   . Hiatal hernia   . Diverticular disease   . BPH (benign prostatic hypertrophy)     with hx of acute urinary retention (pt can self-cath)  . COPD (chronic obstructive pulmonary disease)     05/09/08 PFT FEV1 1.84 (55%), FVC 4.42 (95%), TLC 6.46 (92%), DLCO 72%, no BD response.  Overnight oximetry 10/2011 normal on RA.  Marland Kitchen Pneumonia 1/09  . Allergic rhinitis   . Shingles     X 2 episodes, both in left scapula area  . OSA (obstructive sleep apnea) 06/14/09   PSG RDI 17, PLMI 96, intolerant of CPAP or BPAP  . Chronic renal insufficiency     Baseline Cr 1.5  . Prostate cancer 07/2008    Low grade; watchful waiting with [name]  . Pulmonary nodule, left 03/02/2010    Left base; resolved on f/u CT  . Allergic rhinitis   . Carpal tunnel syndrome of left wrist 02/11/2011  . IMPLANTATION OF DEFIBRILLATOR, HX OF 09/02/2008    Medtronic 7232Cx Maximo V. Had a 6949 lead - failed and therapies turned off.  . IRON DEFICIENCY 06/08/2010    Hemoccult negative:  GI ([name]) recommended no invasive diagnostics--watchful waiting (2012).  . UNSPECIFIED ANEMIA 06/01/2010    Multifactorial: iron def, CRI, vit B12 def,   . Hypotension 04/14/2012  . CHF (congestive heart failure)   . Atrial fibrillation   . CAD (coronary artery disease)     post CABG with prior percutaneous intervention of the saphenous vein graft with known saphenous vein graft disease.  . Automatic implantable cardioverter-defibrillator in situ     turned off per office visit note of Dr Excell Seltzer   . Hypothyroidism   . Shortness of breath   . Arthritis     Past Surgical History  Procedure Laterality Date  . Cardiac defibrillator placement  08/28/2004    Dr. Sharlot Gowda  [place] [facility]  . Colon surgery  1995    partial colectomy  . Coronary artery bypass graft  1996  . Aorta - bilateral femoral artery bypass graft  1999    [name] Early  . Nose surgery    . Insertion of pacing lead      New rate sensing pacing lead with removal of a previous implanted ICD and insertion of a device back in the pocket with defibrillation threshold testing  . Abdominal aortic aneurysm repair  1999    Ingrarenal repair/ graft  . Stents      Placed to the saphenous vein graft  . Radiofrequency ablation  2006    A flutter  . Esophagogastroduodenoscopy (egd) with esophageal dilation  05/28/2012    Procedure: ESOPHAGOGASTRODUODENOSCOPY (EGD) WITH ESOPHAGEAL DILATION;  Surgeon: Rachael Fee, MD;  Location: [facility]  ENDOSCOPY;  Service: Endoscopy;  Laterality: N/A;  . Coronary angioplasty    . Coronary stents     . Transurethral resection of prostate N/A 12/04/2012    Procedure: BIPOLAR TRANSURETHRAL RESECTION OF THE PROSTATE WITH GYRUS INSTRUMENTS;  Surgeon: Antony Haste, MD;  Location: [facility] ORS;  Service: Urology;  Laterality: N/A;    Outpatient Prescriptions Prior to Visit  Medication Sig Dispense Refill  . acetaminophen (TYLENOL) 500 MG tablet Take 1,000 mg by mouth every 6 (six) hours as needed. For headache, pain or fever.      Marland Kitchen albuterol (PROVENTIL) (2.5 MG/3ML) 0.083% nebulizer solution Take 3 mLs (2.5 mg total) by nebulization 4 (four) times daily as needed. Dx 496  360 mL  5  . amiodarone (PACERONE) 200 MG tablet Take 1 tablet (200 mg total) by mouth every morning.  90 tablet  3  . aspirin 81 MG chewable tablet Chew 1 tablet (81 mg total) by mouth daily.      Marland Kitchen aspirin EC 81 MG tablet Take 81 mg by mouth every morning.      . budesonide (PULMICORT) 0.25 MG/2ML nebulizer solution Take 2 mLs (0.25 mg total) by nebulization 2 (two) times daily.  120 mL  5  . carvedilol (COREG) 3.125 MG tablet Take 3.125 mg by mouth 2 (two) times daily with a meal.      . cetirizine (ZYRTEC) 10 MG tablet Take 10 mg by mouth every morning.       . cyanocobalamin 1000 MCG/ML injection Inject 1,000 mcg into the muscle every 30 (thirty) days.       . finasteride (PROSCAR) 5 MG tablet Take 5 mg by mouth at bedtime.       . formoterol (PERFOROMIST) 20 MCG/2ML nebulizer solution Take 2 mLs (20 mcg total) by nebulization 2 (two) times daily.  40 mL  5  . furosemide (LASIX) 40 MG tablet Take 1 tablet (40 mg total) by mouth every morning.  30 tablet  0  . hyoscyamine (LEVSIN SL) 0.125 MG SL tablet Place 1 tablet (0.125 mg total) under the tongue every 4 (four) hours as needed for cramping.  30 tablet  0  . isosorbide mononitrate (IMDUR) 60 MG 24 hr tablet Take 60 mg by mouth every morning.      . levalbuterol  (XOPENEX HFA) 45 MCG/ACT inhaler Inhale 1-2 puffs into the lungs every 4 (four) hours as needed for wheezing or shortness of breath.      . levothyroxine (SYNTHROID, LEVOTHROID) 125 MCG tablet Take 0.5-1 tablets (62.5-125 mcg total) by mouth every other day. Alternate daily with a whole tablet ( ) or a half a  tablet (6.25mcg)  30 tablet  5  . mirtazapine (REMERON) 15 MG tablet Take 1 tablet (15 mg total) by mouth at bedtime.  30 tablet  0  . nitroGLYCERIN (NITROSTAT) 0.3 MG SL tablet Place 1 tablet (0.3 mg total) under the tongue every 5 (five) minutes as needed. For chest pain  25 tablet  2  . omeprazole (PRILOSEC OTC) 20 MG tablet Take 20 mg by mouth daily.      . sitaGLIPtin (JANUVIA) 100 MG tablet Take 100 mg by mouth daily.      . Tamsulosin HCl (FLOMAX) 0.4 MG CAPS Take 0.8 mg by mouth every morning.       . tiotropium (SPIRIVA) 18 MCG inhalation capsule Place 18 mcg into inhaler and inhale daily.      Marland Kitchen warfarin (COUMADIN) 5 MG tablet Takes 1/2 tablet every day except Monday he takes 1 tablet-->HOLD until no further blood in urine.  30 tablet  0  . potassium chloride (K-DUR,KLOR-CON) 10 MEQ tablet Take 10 mEq by mouth daily.      . methylPREDNISolone (MEDROL) 4 MG tablet Take 7 tab po qdx1d then 6 tab po qd x 1d, then 5 tab po qd x 1 d, then 4 tab po qdx1d, then 3 tab po qdx1d, then 2 tab po qd x1 d then 1 tab po qdx1 d and d/c  28 tablet  0  . cephALEXin (KEFLEX) 500 MG capsule Take 1 capsule (500 mg total) by mouth 2 (two) times daily.  20 capsule  0   No facility-administered medications prior to visit.    Allergies  Allergen Reactions  . Atorvastatin     REACTION: muscle ache in legs  . Crestor [Rosuvastatin Calcium] Other (See Comments)    Lower ext fatigue and soreness- if taken everyday  . Dilaudid [Hydromorphone Hcl]     "drunk"  . Lisinopril Diarrhea  . Morphine     REACTION: AMS - agitation  . Prednisone     REACTION: Can't breath  . Trazodone And Nefazodone Other  (See Comments)    "drunk"  . Zolpidem Tartrate     REACTION: AMS    ROS As per HPI  PE: Blood pressure 110/64, pulse 66, temperature 98.5 F (36.9 C), temperature source Oral, height 5\' 10"  (1.778 m), weight 149 lb 12 oz (67.926 kg), SpO2 99.00%. Wt is about 2 lbs less than his hospital d/c wt 2 wks ago Gen: elderly WM in NAD, oriented x 4. ENT: no icteris.  Mild pallor of mucous membranes of eyes and mouth. Neck: no jvd, no TM, no neck mass.  Nontender. CV: irregular rhythm, no murmur/rub/gallop Chest is clear, no wheezing or rales. Normal symmetric air entry throughout both lung fields. No chest wall deformities or tenderness. ABD: soft, NT/ND, BS + EXT: no clubbing, cyanosis, or edema.  SKIN; mild pallor, otherwise no rash or worrisome skin lesions or jaundice.  LABS:  POCT Hb 12+ but I am doubtful of the accuracy of this result.  12 lead EKG:  Sinus  Rhythm  - frequent ectopic ventricular beat s  # VECs = 2 -Left axis -anterior fascicular block.   -Poor R-wave progression -nonspecific -consider old anterior infarct.   -  Diffuse nonspecific T-abnormality.   IMPRESSION AND PLAN:  Paroxysmal atrial fibrillation He may be going in and out of this rhythm lately, causing his more pronounced sx's of CHF. Since he has gone 1 wk without blood in his urine I want to go ahead and  anticoagulate him again--restart coumadin at 5mg  qd x 1 dose, then 2.5mg  qd. Check PT/INR today and again in 4d. CBC and BMET today.  Acute blood loss anemia He is s/p 4 U pRBCs in hosp recently. Recheck CBC today--results of today's POCT capillary Hb seem unlikely (too high).   Acute non-ST segment elevation myocardial infarction Recent post-operative event, in the setting of severe anemia and respiratory insufficiency. Continue ASA, coreg, imdur.   Acute on chronic renal failure Rechecking bMET today.  COPD Currently stable.  ISCHEMIC CARDIOMYOPATHY Question of this being intermittently  worsened when if/when he goes into brief a -fib (losing "atrial kick")--leading to his c/o fatigue and DOE worse since d/c from hospital.   His upcoming cardiology f/u has been graciously moved up to 12/29/12 at 2: 15 pm so he can be reassessed by his cardiologist, Dr. Excell Seltzer.   An After Visit Summary was printed and given to the patient.  Spent 50 min with pt today, with >50% of this time spent in counseling and care coordination regarding the above problems.  FOLLOW UP: Return for lab visit for PT/INR in 5d.  o/v with me 1 wk.

## 2012-12-24 NOTE — Telephone Encounter (Signed)
I spoke with Misty Stanley and the pt is still in the office.  Appointment scheduled on 12/29/12 with Dr Excell Seltzer.

## 2012-12-24 NOTE — Telephone Encounter (Signed)
New problem   Roberto [name] stated Dr Milinda Cave want pt to be seen sooner to do eph and for fatigue. Please call Misty Stanley

## 2012-12-25 ENCOUNTER — Encounter: Payer: Self-pay | Admitting: Adult Health

## 2012-12-25 ENCOUNTER — Ambulatory Visit (INDEPENDENT_AMBULATORY_CARE_PROVIDER_SITE_OTHER): Payer: Medicare Other | Admitting: Adult Health

## 2012-12-25 ENCOUNTER — Telehealth: Payer: Self-pay | Admitting: *Deleted

## 2012-12-25 ENCOUNTER — Other Ambulatory Visit: Payer: Self-pay | Admitting: Family Medicine

## 2012-12-25 ENCOUNTER — Ambulatory Visit (INDEPENDENT_AMBULATORY_CARE_PROVIDER_SITE_OTHER)
Admission: RE | Admit: 2012-12-25 | Discharge: 2012-12-25 | Disposition: A | Discharge status: Home / Self Care | Payer: Medicare Other | Source: Ambulatory Visit | Attending: Adult Health | Admitting: Adult Health

## 2012-12-25 VITALS — BP 110/63 | HR 71 | Temp 98.2°F | Ht 71.0 in | Wt 150.4 lb

## 2012-12-25 DIAGNOSIS — J4489 Other specified chronic obstructive pulmonary disease: Secondary | ICD-10-CM

## 2012-12-25 DIAGNOSIS — E875 Hyperkalemia: Secondary | ICD-10-CM

## 2012-12-25 DIAGNOSIS — N179 Acute kidney failure, unspecified: Secondary | ICD-10-CM

## 2012-12-25 DIAGNOSIS — J449 Chronic obstructive pulmonary disease, unspecified: Secondary | ICD-10-CM

## 2012-12-25 NOTE — Progress Notes (Signed)
Subjective:    Patient ID: Roberto [name], male    DOB: 02/13/1936, 77 y.o.   MRN: [id number]  HPI 77 yo male with known hx of COPD  Tests: PFT 05/09/08 >>FEV1 1.84 (55%), FVC 4.42 (95%), TLC 6.46 (92%), DLCO 72%, no BD response Spirometry 08/28/11>>FEV1 1.51 (43%), FEV1% 48 CT chest 10/01/11>>6.5 cm descending TAA Echo 10/01/12 >> mild LVH, EF 30 to 35%, mild MR, mild LA dilation   12/25/2012  77 years old male with PMH relevant for ischemic cardiomyopathy, A. Fib on coumadin, AAA, post CABG, post AICD, last echo with LVEF of 35%. He also has COPD with chronic respiratory failure, CKD, DM. Presented electively for TURP on 7/26. Developed post op SOB and hypoxic,  He complained of dull back pain likely from being in bed.  Developed  new metabolic acidosis with elevated anion gap and new blood loss.Marland Kitchen He was emergently transfused and fluid resuscitated, given oxygen, bronchodilators and systemic steroids. His Coumadin and aspirin were stopped. Further diagnostic eval Showed very small extraperitoneal rupture noted by CT cystogram 7/26  increase in size of infra-renal AA and increase in thoracic aneurysm already deemed not surgically a candidate. 2) elevated cardiac enzymes, NSTEMI and PAF. Cardiology consultation was obtained. It was felt that he should be treated medically for his NSTEMI.  Over the course of his stay his work of breathing improved. His hgb eventually stabilized and he was deemed ready for d/c as of 7/31.  He has a foley and is following with urology. Coumadin was restarted.  Since discharge he is feeling some better but still very weak. No flare of cough , dyspnea or wheezing.  Of note pt is on macrodantin and amiodarone    Review of Systems Constitutional:   No  weight loss, night sweats,  Fevers, chills, fatigue, or  lassitude.  HEENT:   No headaches,  Difficulty swallowing,  Tooth/dental problems, or  Sore throat,                No sneezing, itching, ear ache, nasal  congestion, post nasal drip,   CV:  No chest pain,  Orthopnea, PND, swelling in lower extremities, anasarca, dizziness, palpitations, syncope.   GI  No heartburn, indigestion, abdominal pain, nausea, vomiting, diarrhea, change in bowel habits, loss of appetite, bloody stools.   Resp:    No coughing up of blood.  No change in color of mucus.  No wheezing.  No chest wall deformity  Skin: no rash or lesions.  GU:+foley   MS:  No joint pain or swelling.  No decreased range of motion.  No back pain.  Psych:  No change in mood or affect. No depression or anxiety.  No memory loss.         Objective:   Physical Exam GEN: A/Ox3; pleasant , NAD  HEENT:  [place]/AT,  EACs-clear, TMs-wnl, NOSE-clear, THROAT-clear, no lesions, no postnasal drip or exudate noted.   NECK:  Supple w/ fair ROM; no JVD; normal carotid impulses w/o bruits; no thyromegaly or nodules palpated; no lymphadenopathy.  RESP  Diminished BS in bases no accessory muscle use, no dullness to percussion  CARD:  RRR, no m/r/g  , no peripheral edema, pulses intact, no cyanosis or clubbing.  GI:   Soft & nt; nml bowel sounds; no organomegaly or masses detected. GU: Foley   Musco: Warm bil, no deformities or joint swelling noted.   Neuro: alert, no focal deficits noted.    Skin: Warm, no lesions or rashes  CXR 12/25/12 : Resolution of previously seen vascular congestion.       Assessment & Plan:

## 2012-12-25 NOTE — Telephone Encounter (Signed)
Patient called office confirming that Dr. Milinda Cave took him off klor con. Confirmed with patient that this is the correct medication.

## 2012-12-25 NOTE — Assessment & Plan Note (Addendum)
Compensated on present regimen  No flare w/ recent post op critical illness .  Monitor closely as on Amiodarone and Macrodantin  follow up Dr. [name]  In 4 weeks and As needed   follow up with PCP, cards and urology as planned

## 2012-12-25 NOTE — Patient Instructions (Addendum)
Continue on current regimen  follow up with Dr. [name]  Next month as planned and As needed   Chest xray today .  Please contact office for sooner follow up if symptoms do not improve or worsen or seek emergency care

## 2012-12-28 DIAGNOSIS — I480 Paroxysmal atrial fibrillation: Secondary | ICD-10-CM | POA: Insufficient documentation

## 2012-12-28 NOTE — Assessment & Plan Note (Signed)
He may be going in and out of this rhythm lately, causing his more pronounced sx's of CHF. Since he has gone 1 wk without blood in his urine I want to go ahead and anticoagulate him again--restart coumadin at 5mg  qd x 1 dose, then 2.5mg  qd. Check PT/INR today and again in 4d. CBC and BMET today.

## 2012-12-28 NOTE — Assessment & Plan Note (Addendum)
Question of this being intermittently worsened when if/when he goes into brief a -fib (losing "atrial kick")--leading to his c/o fatigue and DOE worse since d/c from hospital.   His upcoming cardiology f/u has been graciously moved up to 12/29/12 at 2: 15 pm so he can be reassessed by his cardiologist, Dr. Excell Seltzer.

## 2012-12-28 NOTE — Assessment & Plan Note (Signed)
He is s/p 4 U pRBCs in hosp recently. Recheck CBC today--results of today's POCT capillary Hb seem unlikely (too high).

## 2012-12-28 NOTE — Assessment & Plan Note (Signed)
Recent post-operative event, in the setting of severe anemia and respiratory insufficiency. Continue ASA, coreg, imdur.

## 2012-12-28 NOTE — Assessment & Plan Note (Signed)
Currently stable.

## 2012-12-28 NOTE — Assessment & Plan Note (Signed)
Rechecking bMET today.

## 2012-12-29 ENCOUNTER — Ambulatory Visit (INDEPENDENT_AMBULATORY_CARE_PROVIDER_SITE_OTHER): Payer: Medicare Other | Admitting: *Deleted

## 2012-12-29 ENCOUNTER — Other Ambulatory Visit: Payer: Medicare Other

## 2012-12-29 ENCOUNTER — Ambulatory Visit (INDEPENDENT_AMBULATORY_CARE_PROVIDER_SITE_OTHER): Payer: Medicare Other | Admitting: Cardiovascular Disease

## 2012-12-29 ENCOUNTER — Encounter: Payer: Self-pay | Admitting: Cardiovascular Disease

## 2012-12-29 VITALS — BP 102/76 | HR 81 | Ht 71.5 in | Wt 149.0 lb

## 2012-12-29 DIAGNOSIS — I4891 Unspecified atrial fibrillation: Secondary | ICD-10-CM

## 2012-12-29 DIAGNOSIS — I480 Paroxysmal atrial fibrillation: Secondary | ICD-10-CM

## 2012-12-29 DIAGNOSIS — E039 Hypothyroidism, unspecified: Secondary | ICD-10-CM

## 2012-12-29 DIAGNOSIS — Z7901 Long term (current) use of anticoagulants: Secondary | ICD-10-CM

## 2012-12-29 LAB — POCT INR: INR: 1.4

## 2012-12-29 NOTE — Patient Instructions (Addendum)
Your physician recommends that you schedule a follow-up appointment in: 4 MONTHS with Dr Cooper  Your physician recommends that you continue on your current medications as directed. Please refer to the Current Medication list given to you today.  

## 2012-12-31 ENCOUNTER — Ambulatory Visit: Payer: Medicare Other | Admitting: Family Medicine

## 2013-01-01 ENCOUNTER — Encounter: Payer: Self-pay | Admitting: Cardiovascular Disease

## 2013-01-01 NOTE — Progress Notes (Signed)
HPI:  77 year old gentleman presenting for followup evaluation.  The patient has a multiple medical problems. He has an ischemic cardiomyopathy. He's undergone coronary bypass surgery and PCI. His last heart catheterization was in 2011 by Dr. Juanda Chance. This demonstrated severe three-vessel native coronary disease. His bypass grafts were occluded with the exception of the LIMA to LAD and vein graft to the diagonal.   The patient also has a large arachnoidal abdominal aneurysm. He was evaluated by Dr. Pattricia Boss at [facility]. He was quoted a 15-20% annual risk of rupture. However, considering his operative morbidity and mortality risk, he decided not to pursue surgery. He recently underwent TURP because of obstruction and worsening renal function. His postoperative course was complicated by blood loss anemia requiring prbc transfusion, hypoxic respiratory failure, and NSTEMI as well as brief periods of AF with RVR. His recovery from surgery has been slow but he is beginning to feel a little better. He denies recurrence of chest pain or pressure. Chronic dyspnea has continued to be a problem. No orthopnea, PND, or leg swelling. He has had some palpitations.   Outpatient Encounter Prescriptions as of 12/29/2012  Medication Sig Dispense Refill  . acetaminophen (TYLENOL) 500 MG tablet Take 1,000 mg by mouth every 6 (six) hours as needed. For headache, pain or fever.      Marland Kitchen albuterol (PROVENTIL) (2.5 MG/3ML) 0.083% nebulizer solution Take 3 mLs (2.5 mg total) by nebulization 4 (four) times daily as needed. Dx 496  360 mL  5  . amiodarone (PACERONE) 200 MG tablet Take 1 tablet (200 mg total) by mouth every morning.  90 tablet  3  . aspirin EC 81 MG tablet Take 81 mg by mouth every morning.      . budesonide (PULMICORT) 0.25 MG/2ML nebulizer solution Take 2 mLs (0.25 mg total) by nebulization 2 (two) times daily.  120 mL  5  . carvedilol (COREG) 3.125 MG tablet Take 3.125 mg by mouth 2 (two) times daily with a meal.      .  cetirizine (ZYRTEC) 10 MG tablet Take 10 mg by mouth every morning.       . cyanocobalamin 1000 MCG/ML injection Inject 1,000 mcg into the muscle every 30 (thirty) days.       . finasteride (PROSCAR) 5 MG tablet Take 5 mg by mouth at bedtime.       . formoterol (PERFOROMIST) 20 MCG/2ML nebulizer solution Take 2 mLs (20 mcg total) by nebulization 2 (two) times daily.  40 mL  5  . furosemide (LASIX) 40 MG tablet Take 1 tablet (40 mg total) by mouth every morning.  30 tablet  0  . hyoscyamine (LEVSIN SL) 0.125 MG SL tablet Place 1 tablet (0.125 mg total) under the tongue every 4 (four) hours as needed for cramping.  30 tablet  0  . isosorbide mononitrate (IMDUR) 60 MG 24 hr tablet Take 60 mg by mouth every morning.      . levalbuterol (XOPENEX HFA) 45 MCG/ACT inhaler Inhale 1-2 puffs into the lungs every 4 (four) hours as needed for wheezing or shortness of breath.      . levothyroxine (SYNTHROID, LEVOTHROID) 125 MCG tablet Take 0.5-1 tablets (62.5-125 mcg total) by mouth every other day. Alternate daily with a whole tablet ( ) or a half a tablet (6.58mcg)  30 tablet  5  . mirtazapine (REMERON) 15 MG tablet Take 1 tablet (15 mg total) by mouth at bedtime.  30 tablet  0  . nitrofurantoin (MACRODANTIN) 50 MG capsule  Take 50 mg by mouth daily.      . nitroGLYCERIN (NITROSTAT) 0.3 MG SL tablet Place 1 tablet (0.3 mg total) under the tongue every 5 (five) minutes as needed. For chest pain  25 tablet  2  . omeprazole (PRILOSEC OTC) 20 MG tablet Take 20 mg by mouth daily.      . sitaGLIPtin (JANUVIA) 100 MG tablet Take 100 mg by mouth daily.      . Tamsulosin HCl (FLOMAX) 0.4 MG CAPS Take 0.8 mg by mouth every morning.       . tiotropium (SPIRIVA) 18 MCG inhalation capsule Place 18 mcg into inhaler and inhale daily.      Marland Kitchen warfarin (COUMADIN) 5 MG tablet Takes 1/2 tablet every day except Monday he takes 1 tablet-->HOLD until no further blood in urine.  30 tablet  0  . [DISCONTINUED] aspirin 81 MG  chewable tablet Chew 1 tablet (81 mg total) by mouth daily.      . [DISCONTINUED] methylPREDNISolone (MEDROL) 4 MG tablet Take 7 tab po qdx1d then 6 tab po qd x 1d, then 5 tab po qd x 1 d, then 4 tab po qdx1d, then 3 tab po qdx1d, then 2 tab po qd x1 d then 1 tab po qdx1 d and d/c  28 tablet  0   No facility-administered encounter medications on file as of 12/29/2012.    Allergies  Allergen Reactions  . Atorvastatin     REACTION: muscle ache in legs  . Crestor [Rosuvastatin Calcium] Other (See Comments)    Lower ext fatigue and soreness- if taken everyday  . Dilaudid [Hydromorphone Hcl]     "drunk"  . Lisinopril Diarrhea  . Morphine     REACTION: AMS - agitation  . Prednisone     REACTION: Can't breath  . Trazodone And Nefazodone Other (See Comments)    "drunk"  . Zolpidem Tartrate     REACTION: AMS    Past Medical History  Diagnosis Date  . AAA (abdominal aortic aneurysm)     a. CTA 09/2011: 4.1-4.2cm infrarenal AAA.  Marland Kitchen Aneurysm of thoracic aorta 1995    a. Rupture 1995 w/spontaneous resolution;  b. CTA 09/2011: 6.8cm dist, 5.7cm mid  . Popliteal artery aneurysm   . Myocardial infarction   . Ischemic cardiomyopathy     10/2011 ECHO: EF 30-35%, apical akinesis  . Paroxysmal atrial fibrillation   . Hypertension   . Peripheral vascular disease   . Dyslipidemia   . Diabetes mellitus 2007    dx'd 2007 w/persisting FBS>125  . GERD (gastroesophageal reflux disease)   . Hiatal hernia   . Diverticular disease   . BPH (benign prostatic hypertrophy)     with hx of acute urinary retention (pt can self-cath)  . COPD (chronic obstructive pulmonary disease)     05/09/08 PFT FEV1 1.84 (55%), FVC 4.42 (95%), TLC 6.46 (92%), DLCO 72%, no BD response.  Overnight oximetry 10/2011 normal on RA.  Marland Kitchen Pneumonia 1/09  . Allergic rhinitis   . Shingles     X 2 episodes, both in left scapula area  . OSA (obstructive sleep apnea) 06/14/09    PSG RDI 17, PLMI 96, intolerant of CPAP or BPAP  .  Chronic renal insufficiency     Baseline Cr 1.5  . Prostate cancer 07/2008    Low grade; watchful waiting with [name]  . Pulmonary nodule, left 03/02/2010    Left base; resolved on f/u CT  . Allergic rhinitis   .  Carpal tunnel syndrome of left wrist 02/11/2011  . IMPLANTATION OF DEFIBRILLATOR, HX OF 09/02/2008    Medtronic 7232Cx Maximo V. Had a 6949 lead - failed and therapies turned off.  . IRON DEFICIENCY 06/08/2010    Hemoccult negative:  GI ([name]) recommended no invasive diagnostics--watchful waiting (2012).  . UNSPECIFIED ANEMIA 06/01/2010    Multifactorial: iron def, CRI, vit B12 def,   . Hypotension 04/14/2012  . CHF (congestive heart failure)   . Atrial fibrillation   . CAD (coronary artery disease)     post CABG with prior percutaneous intervention of the saphenous vein graft with known saphenous vein graft disease.  . Automatic implantable cardioverter-defibrillator in situ     turned off per office visit note of Dr Excell Seltzer   . Hypothyroidism   . Shortness of breath   . Arthritis     ROS: Negative except as per HPI  BP 102/76  Pulse 81  Ht 5' 11.5" (1.816 m)  Wt 149 lb (67.586 kg)  BMI 20.49 kg/m2  SpO2 95%  PHYSICAL EXAM: Pt is alert and oriented, NAD, chronically ill-appearing HEENT: normal Neck: JVP - normal Lungs: CTA bilaterally CV: RRR without murmur or gallop Abd: soft, NT, Positive BS, no hepatomegaly Ext: no C/C/E, distal pulses intact and equal Skin: warm/dry no rash  EKG:  Sinus rhythm 81 bpm, occasional PVC, nonspecific ST and T wave abnormality.  2D Echo 10/01/12: Study Conclusions  - Left ventricle: The cavity size was mildly dilated. Wall thickness was increased in a pattern of mild LVH. There was mild focal basal hypertrophy of the septum. Systolic function was moderately to severely reduced. The estimated ejection fraction was 35%, in the range of 30% to 35%. There is akinesis of the mid-distalanteroseptal and  apical myocardium. - Mitral valve: Mild regurgitation. - Left atrium: The atrium was mildly dilated.  ASSESSMENT AND PLAN: 1. CAD s/p CABG/PCI. Stable without angina. NSTEMI postoperatively in setting acute blood loss. Symptoms resolved with transfusion. Overall stable and will continue current Rx.  2. Chronic systolic heart failure. Volume status appears stable. Continue low-dose of carvedilol. Not a candidate for ACE/ARB with fragile kidney function/low BP.  3. PAF - on amio and in sinus at present. May be having some breakthrough AF with symptomatic tachypalpitations but I don't think we can advance meds.  4. AAA - conservative Rx.  I will see him back in 4 months. He understands that with his medical problems, recovery from surgery is expected to be slow. I think he is making progress.  Tonny Bollman 01/01/2013 6:22 AM

## 2013-01-04 ENCOUNTER — Telehealth: Payer: Self-pay | Admitting: Pulmonary Disease

## 2013-01-04 NOTE — Telephone Encounter (Signed)
I spoke with pt. He tried to get his budesonide refilled but was advised it was too early to fill. He stated he takes 1 vial BID as directed  I called [facility] and they are going to call his insurance to try and get this over ride. They will call pt once done  I called and made pt aware. Also advised him if they inform him he can't still get it to call us back and let us know. He voiced his understanding and needed nothing further

## 2013-01-05 ENCOUNTER — Ambulatory Visit: Payer: Medicare Other | Admitting: Cardiovascular Disease

## 2013-01-06 ENCOUNTER — Ambulatory Visit (INDEPENDENT_AMBULATORY_CARE_PROVIDER_SITE_OTHER): Payer: Medicare Other | Admitting: *Deleted

## 2013-01-06 DIAGNOSIS — I4891 Unspecified atrial fibrillation: Secondary | ICD-10-CM

## 2013-01-06 DIAGNOSIS — Z7901 Long term (current) use of anticoagulants: Secondary | ICD-10-CM

## 2013-01-13 ENCOUNTER — Other Ambulatory Visit: Payer: Self-pay | Admitting: Pulmonary Disease

## 2013-01-19 ENCOUNTER — Other Ambulatory Visit: Payer: Self-pay | Admitting: Family Medicine

## 2013-01-19 MED ORDER — FUROSEMIDE 40 MG PO TABS: 40.0000 mg | ORAL_TABLET | Freq: Every morning | ORAL | Status: AC

## 2013-01-19 NOTE — Telephone Encounter (Signed)
Filled medication for 30 days with no refill due to pt not keeping his follow up.

## 2013-01-21 ENCOUNTER — Ambulatory Visit (INDEPENDENT_AMBULATORY_CARE_PROVIDER_SITE_OTHER): Payer: Medicare Other

## 2013-01-21 DIAGNOSIS — I4891 Unspecified atrial fibrillation: Secondary | ICD-10-CM

## 2013-01-21 DIAGNOSIS — Z7901 Long term (current) use of anticoagulants: Secondary | ICD-10-CM

## 2013-01-26 ENCOUNTER — Other Ambulatory Visit: Payer: Self-pay | Admitting: Family Medicine

## 2013-01-26 MED ORDER — MIRTAZAPINE 15 MG PO TABS: 15.0000 mg | ORAL_TABLET | Freq: Every day | ORAL | Status: AC

## 2013-02-05 ENCOUNTER — Ambulatory Visit (INDEPENDENT_AMBULATORY_CARE_PROVIDER_SITE_OTHER): Payer: Medicare Other | Admitting: Pulmonary Disease

## 2013-02-05 ENCOUNTER — Encounter: Payer: Self-pay | Admitting: Pulmonary Disease

## 2013-02-05 VITALS — BP 108/66 | HR 65 | Temp 98.2°F | Ht 71.5 in | Wt 159.0 lb

## 2013-02-05 DIAGNOSIS — J4489 Other specified chronic obstructive pulmonary disease: Secondary | ICD-10-CM

## 2013-02-05 DIAGNOSIS — J449 Chronic obstructive pulmonary disease, unspecified: Secondary | ICD-10-CM

## 2013-02-05 NOTE — Assessment & Plan Note (Signed)
Encouraged him to keep up his activity level.  He is to continue performist and pulmicort bid with prn albuterol and xopenex.  He can try decreasing spiriva as tolerated.  He will get his flu shot next week with his PCP.

## 2013-02-05 NOTE — Patient Instructions (Signed)
Follow up in 6 months 

## 2013-02-05 NOTE — Progress Notes (Signed)
Chief Complaint  Patient presents with  . Hospitalization Follow-up    Breathing is unchanged. Reports DOE and wheezing. Denies chest tightness or cough.    History of Present Illness: Roberto [name] is a 77 y.o. male with COPD, and rhinitis.  He was in hospital over the summer for TURP complicated by bladder injury.   He is slowly recovering.  He gets winded with minimal exertion.  He is not having much cough, wheeze, or sputum.  He uses performist/pulmicort bid, and spiriva daily.  He is not sure how much these are helping.  Tests: PFT 05/09/08 >>FEV1 1.84 (55%), FVC 4.42 (95%), TLC 6.46 (92%), DLCO 72%, no BD response Spirometry 08/28/11>>FEV1 1.51 (43%), FEV1% 48 CT chest 10/01/11>>6.5 cm descending TAA Echo 10/01/12 >> mild LVH, EF 30 to 35%, mild MR, mild LA dilation   He  has a past medical history of AAA (abdominal aortic aneurysm); Aneurysm of thoracic aorta (1995); Popliteal artery aneurysm; Myocardial infarction; Ischemic cardiomyopathy; Paroxysmal atrial fibrillation; Hypertension; Peripheral vascular disease; Dyslipidemia; Diabetes mellitus (2007); GERD (gastroesophageal reflux disease); Hiatal hernia; Diverticular disease; BPH (benign prostatic hypertrophy); COPD (chronic obstructive pulmonary disease); Pneumonia (1/09); Allergic rhinitis; Shingles; OSA (obstructive sleep apnea) (06/14/09); Chronic renal insufficiency; Prostate cancer (07/2008); Pulmonary nodule, left (03/02/2010); Allergic rhinitis; Carpal tunnel syndrome of left wrist (02/11/2011); IMPLANTATION OF DEFIBRILLATOR, HX OF (09/02/2008); IRON DEFICIENCY (06/08/2010); UNSPECIFIED ANEMIA (06/01/2010); Hypotension (04/14/2012); CHF (congestive heart failure); Atrial fibrillation; CAD (coronary artery disease); Automatic implantable cardioverter-defibrillator in situ; Hypothyroidism; Shortness of breath; and Arthritis.   He  has past surgical history that includes Cardiac defibrillator placement (08/28/2004); Colon surgery  (1995); Coronary artery bypass graft (1996); Aorta - bilateral femoral artery bypass graft (1999); Nose surgery; Insertion of pacing lead; Abdominal aortic aneurysm repair (1999); Stents; Radiofrequency ablation (2006); Esophagogastroduodenoscopy (egd) with esophageal dilation (05/28/2012); Coronary angioplasty; coronary stents ; and Transurethral resection of prostate (N/A, 12/04/2012).   Current Outpatient Prescriptions on File Prior to Visit  Medication Sig Dispense Refill  . acetaminophen (TYLENOL) 500 MG tablet Take 1,000 mg by mouth every 6 (six) hours as needed. For headache, pain or fever.      Marland Kitchen albuterol (PROVENTIL) (2.5 MG/3ML) 0.083% nebulizer solution Take 3 mLs (2.5 mg total) by nebulization 4 (four) times daily as needed. Dx 496  360 mL  5  . amiodarone (PACERONE) 200 MG tablet Take 1 tablet (200 mg total) by mouth every morning.  90 tablet  3  . aspirin EC 81 MG tablet Take 81 mg by mouth every morning.      . budesonide (PULMICORT) 0.25 MG/2ML nebulizer solution Take 2 mLs (0.25 mg total) by nebulization 2 (two) times daily.  120 mL  5  . carvedilol (COREG) 3.125 MG tablet Take 3.125 mg by mouth 2 (two) times daily with a meal.      . cetirizine (ZYRTEC) 10 MG tablet Take 10 mg by mouth every morning.       . cyanocobalamin 1000 MCG/ML injection Inject 1,000 mcg into the muscle every 30 (thirty) days.       . finasteride (PROSCAR) 5 MG tablet Take 5 mg by mouth at bedtime.       . furosemide (LASIX) 40 MG tablet Take 1 tablet (40 mg total) by mouth every morning.  30 tablet  0  . hyoscyamine (LEVSIN SL) 0.125 MG SL tablet Place 1 tablet (0.125 mg total) under the tongue every 4 (four) hours as needed for cramping.  30 tablet  0  . isosorbide  mononitrate (IMDUR) 60 MG 24 hr tablet Take 60 mg by mouth every morning.      . levalbuterol (XOPENEX HFA) 45 MCG/ACT inhaler Inhale 1-2 puffs into the lungs every 4 (four) hours as needed for wheezing or shortness of breath.      .  levothyroxine (SYNTHROID, LEVOTHROID) 125 MCG tablet Take 0.5-1 tablets (62.5-125 mcg total) by mouth every other day. Alternate daily with a whole tablet ( ) or a half a tablet (6.37mcg)  30 tablet  5  . mirtazapine (REMERON) 15 MG tablet Take 1 tablet (15 mg total) by mouth at bedtime.  30 tablet  3  . nitrofurantoin (MACRODANTIN) 50 MG capsule Take 50 mg by mouth daily.      . nitroGLYCERIN (NITROSTAT) 0.3 MG SL tablet Place 1 tablet (0.3 mg total) under the tongue every 5 (five) minutes as needed. For chest pain  25 tablet  2  . omeprazole (PRILOSEC OTC) 20 MG tablet Take 20 mg by mouth daily.      Marland Kitchen PERFOROMIST 20 MCG/2ML nebulizer solution TAKE 2 MLS (20 MCG TOTAL) BY NEBULIZATION 2 (TWO) TIMES DAILY.  60 mL  5  . sitaGLIPtin (JANUVIA) 100 MG tablet Take 100 mg by mouth daily.      . Tamsulosin HCl (FLOMAX) 0.4 MG CAPS Take 0.8 mg by mouth every morning.       . tiotropium (SPIRIVA) 18 MCG inhalation capsule Place 18 mcg into inhaler and inhale daily.      Marland Kitchen warfarin (COUMADIN) 5 MG tablet Takes 1/2 tablet every day except Monday he takes 1 tablet-->HOLD until no further blood in urine.  30 tablet  0   No current facility-administered medications on file prior to visit.    Allergies  Allergen Reactions  . Atorvastatin     REACTION: muscle ache in legs  . Crestor [Rosuvastatin Calcium] Other (See Comments)    Lower ext fatigue and soreness- if taken everyday  . Dilaudid [Hydromorphone Hcl]     "drunk"  . Lisinopril Diarrhea  . Morphine     REACTION: AMS - agitation  . Prednisone     REACTION: Can't breath  . Trazodone And Nefazodone Other (See Comments)    "drunk"  . Zolpidem Tartrate     REACTION: AMS    Physical Exam:  General - thin HEENT - No sinus tenderness, no oral exudate, no LAN  Cardiac - s1s2 no murmur  Chest - diminished breath sounds, no wheeze/rales  Abd - soft, nontender  Ext - no edema  Neuro - normal strength Psych - normal behavior/mood  Skin -  multiple ecchysmoses    Assessment/Plan:  Coralyn Helling, MD [facility] Pager:  [phone]

## 2013-02-08 ENCOUNTER — Ambulatory Visit (INDEPENDENT_AMBULATORY_CARE_PROVIDER_SITE_OTHER): Payer: Medicare Other | Admitting: *Deleted

## 2013-02-08 ENCOUNTER — Ambulatory Visit: Payer: Medicare Other

## 2013-02-08 DIAGNOSIS — E538 Deficiency of other specified B group vitamins: Secondary | ICD-10-CM

## 2013-02-08 DIAGNOSIS — Z23 Encounter for immunization: Secondary | ICD-10-CM

## 2013-02-08 MED ORDER — CYANOCOBALAMIN 1000 MCG/ML IJ SOLN
1000.0000 ug | Freq: Once | INTRAMUSCULAR | Status: AC
  Administered 2013-02-08: 1000 ug via INTRAMUSCULAR

## 2013-02-09 ENCOUNTER — Telehealth: Payer: Self-pay | Admitting: Family Medicine

## 2013-02-09 NOTE — Telephone Encounter (Signed)
Patient needs to take medication for a cold. Can he take Mucinex DM or should he take the regular?

## 2013-02-09 NOTE — Telephone Encounter (Signed)
Please advise 

## 2013-02-09 NOTE — Telephone Encounter (Signed)
Mucinex DM is fine 

## 2013-02-09 NOTE — Telephone Encounter (Signed)
Patient aware.

## 2013-02-11 ENCOUNTER — Ambulatory Visit (INDEPENDENT_AMBULATORY_CARE_PROVIDER_SITE_OTHER): Payer: Medicare Other | Admitting: General Practice

## 2013-02-11 DIAGNOSIS — I4891 Unspecified atrial fibrillation: Secondary | ICD-10-CM

## 2013-02-11 DIAGNOSIS — Z7901 Long term (current) use of anticoagulants: Secondary | ICD-10-CM

## 2013-02-20 ENCOUNTER — Emergency Department (HOSPITAL_BASED_OUTPATIENT_CLINIC_OR_DEPARTMENT_OTHER): Payer: Medicare Other

## 2013-02-20 ENCOUNTER — Encounter (HOSPITAL_BASED_OUTPATIENT_CLINIC_OR_DEPARTMENT_OTHER): Payer: Self-pay | Admitting: Emergency Medicine

## 2013-02-20 ENCOUNTER — Inpatient Hospital Stay (HOSPITAL_BASED_OUTPATIENT_CLINIC_OR_DEPARTMENT_OTHER)
Admission: EM | Admit: 2013-02-20 | Discharge: 2013-03-13 | DRG: 300 | Disposition: E | Payer: Medicare Other | Attending: Internal Medicine | Admitting: Internal Medicine

## 2013-02-20 DIAGNOSIS — Z87891 Personal history of nicotine dependence: Secondary | ICD-10-CM

## 2013-02-20 DIAGNOSIS — I10 Essential (primary) hypertension: Secondary | ICD-10-CM | POA: Diagnosis present

## 2013-02-20 DIAGNOSIS — N400 Enlarged prostate without lower urinary tract symptoms: Secondary | ICD-10-CM | POA: Diagnosis present

## 2013-02-20 DIAGNOSIS — G4733 Obstructive sleep apnea (adult) (pediatric): Secondary | ICD-10-CM | POA: Diagnosis present

## 2013-02-20 DIAGNOSIS — I4892 Unspecified atrial flutter: Secondary | ICD-10-CM | POA: Diagnosis present

## 2013-02-20 DIAGNOSIS — I2589 Other forms of chronic ischemic heart disease: Secondary | ICD-10-CM | POA: Diagnosis present

## 2013-02-20 DIAGNOSIS — Z79899 Other long term (current) drug therapy: Secondary | ICD-10-CM

## 2013-02-20 DIAGNOSIS — Z8546 Personal history of malignant neoplasm of prostate: Secondary | ICD-10-CM

## 2013-02-20 DIAGNOSIS — K219 Gastro-esophageal reflux disease without esophagitis: Secondary | ICD-10-CM | POA: Diagnosis present

## 2013-02-20 DIAGNOSIS — E039 Hypothyroidism, unspecified: Secondary | ICD-10-CM | POA: Diagnosis present

## 2013-02-20 DIAGNOSIS — Z7901 Long term (current) use of anticoagulants: Secondary | ICD-10-CM

## 2013-02-20 DIAGNOSIS — I713 Abdominal aortic aneurysm, ruptured, unspecified: Secondary | ICD-10-CM | POA: Diagnosis present

## 2013-02-20 DIAGNOSIS — I4891 Unspecified atrial fibrillation: Secondary | ICD-10-CM | POA: Diagnosis present

## 2013-02-20 DIAGNOSIS — Z823 Family history of stroke: Secondary | ICD-10-CM

## 2013-02-20 DIAGNOSIS — I715 Thoracoabdominal aortic aneurysm, ruptured, unspecified: Principal | ICD-10-CM | POA: Diagnosis present

## 2013-02-20 DIAGNOSIS — I509 Heart failure, unspecified: Secondary | ICD-10-CM | POA: Diagnosis present

## 2013-02-20 DIAGNOSIS — E785 Hyperlipidemia, unspecified: Secondary | ICD-10-CM | POA: Diagnosis present

## 2013-02-20 DIAGNOSIS — I2510 Atherosclerotic heart disease of native coronary artery without angina pectoris: Secondary | ICD-10-CM | POA: Diagnosis present

## 2013-02-20 DIAGNOSIS — N189 Chronic kidney disease, unspecified: Secondary | ICD-10-CM | POA: Diagnosis present

## 2013-02-20 DIAGNOSIS — Z801 Family history of malignant neoplasm of trachea, bronchus and lung: Secondary | ICD-10-CM

## 2013-02-20 DIAGNOSIS — Z8249 Family history of ischemic heart disease and other diseases of the circulatory system: Secondary | ICD-10-CM

## 2013-02-20 DIAGNOSIS — J449 Chronic obstructive pulmonary disease, unspecified: Secondary | ICD-10-CM | POA: Diagnosis present

## 2013-02-20 DIAGNOSIS — I252 Old myocardial infarction: Secondary | ICD-10-CM

## 2013-02-20 DIAGNOSIS — I129 Hypertensive chronic kidney disease with stage 1 through stage 4 chronic kidney disease, or unspecified chronic kidney disease: Secondary | ICD-10-CM | POA: Diagnosis present

## 2013-02-20 DIAGNOSIS — I739 Peripheral vascular disease, unspecified: Secondary | ICD-10-CM | POA: Diagnosis present

## 2013-02-20 DIAGNOSIS — Z515 Encounter for palliative care: Secondary | ICD-10-CM

## 2013-02-20 DIAGNOSIS — Z66 Do not resuscitate: Secondary | ICD-10-CM | POA: Diagnosis present

## 2013-02-20 DIAGNOSIS — M129 Arthropathy, unspecified: Secondary | ICD-10-CM | POA: Diagnosis present

## 2013-02-20 DIAGNOSIS — E119 Type 2 diabetes mellitus without complications: Secondary | ICD-10-CM | POA: Diagnosis present

## 2013-02-20 DIAGNOSIS — Z951 Presence of aortocoronary bypass graft: Secondary | ICD-10-CM

## 2013-02-20 DIAGNOSIS — J4489 Other specified chronic obstructive pulmonary disease: Secondary | ICD-10-CM | POA: Diagnosis present

## 2013-02-20 LAB — COMPREHENSIVE METABOLIC PANEL
ALT: 14 U/L (ref 0–53)
Alkaline Phosphatase: 87 U/L (ref 39–117)
CO2: 24 mEq/L (ref 19–32)
GFR calc Af Amer: 47 mL/min — ABNORMAL LOW (ref >90)
Glucose, Bld: 196 mg/dL — ABNORMAL HIGH (ref 70–99)
Potassium: 3.6 mEq/L (ref 3.5–5.1)
Sodium: 139 mEq/L (ref 135–145)
Total Protein: 7.2 g/dL (ref 6.0–8.3)

## 2013-02-20 LAB — CBC WITH DIFFERENTIAL/PLATELET
Eosinophils Absolute: 0 10*3/uL (ref 0.0–0.7)
HCT: 30.1 % — ABNORMAL LOW (ref 39.0–52.0)
Hemoglobin: 9.2 g/dL — ABNORMAL LOW (ref 13.0–17.0)
Lymphocytes Relative: 7 % — ABNORMAL LOW (ref 12–46)
Lymphs Abs: 0.8 10*3/uL (ref 0.7–4.0)
MCV: 81.8 fL (ref 78.0–100.0)
Neutrophils Relative %: 87 % — ABNORMAL HIGH (ref 43–77)
Platelets: 173 10*3/uL (ref 150–400)

## 2013-02-20 MED ORDER — FENTANYL CITRATE 0.05 MG/ML IJ SOLN
INTRAMUSCULAR | Status: AC
  Administered 2013-02-21: 50 ug via INTRAVENOUS
  Filled 2013-02-20: qty 2

## 2013-02-20 MED ORDER — FENTANYL CITRATE 0.05 MG/ML IJ SOLN
50.0000 ug | Freq: Once | INTRAMUSCULAR | Status: AC
  Administered 2013-02-20: 50 ug via INTRAVENOUS
  Filled 2013-02-20: qty 2

## 2013-02-20 MED ORDER — IOHEXOL 300 MG/ML  SOLN
60.0000 mL | Freq: Once | INTRAMUSCULAR | Status: AC | PRN
  Administered 2013-02-20: 60 mL via INTRAVENOUS

## 2013-02-20 MED ORDER — SODIUM CHLORIDE 0.9 % IV BOLUS (SEPSIS)
1000.0000 mL | Freq: Once | INTRAVENOUS | Status: AC
  Administered 2013-02-20: 1000 mL via INTRAVENOUS

## 2013-02-20 MED ORDER — VITAMIN K1 10 MG/ML IJ SOLN
INTRAMUSCULAR | Status: AC
  Administered 2013-02-20: 23:00:00
  Filled 2013-02-20: qty 1

## 2013-02-20 MED ORDER — FENTANYL CITRATE 0.05 MG/ML IJ SOLN
50.0000 ug | Freq: Once | INTRAMUSCULAR | Status: AC
  Administered 2013-02-20: 50 ug via INTRAVENOUS

## 2013-02-20 MED ORDER — VITAMIN K1 10 MG/ML IJ SOLN
10.0000 mg | Freq: Once | INTRAVENOUS | Status: AC
  Administered 2013-02-20: 10 mg via INTRAVENOUS

## 2013-02-20 NOTE — ED Provider Notes (Signed)
CSN: [id number]     Arrival date & time 02/23/2013  2001 History  This chart was scribed for Geoffery Lyons, MD by Ronal Fear, ED Scribe. This patient was seen in room MH05/MH05 and the patient's care was started at 9:02 PM.    Chief Complaint  Patient presents with  . Back Pain    Patient is a 77 y.o. male presenting with back pain. The history is provided by the patient. No language interpreter was used.  Back Pain Location:  Thoracic spine Radiates to:  Does not radiate Pain severity:  Mild Pain is:  Same all the time Onset quality:  Sudden Duration:  3 hours Timing:  Rare Progression:  Unchanged Chronicity:  New Context: physical stress   Relieved by:  None tried Worsened by:  Sitting and twisting Ineffective treatments:  None tried Associated symptoms: abdominal pain   Associated symptoms: no abdominal swelling, no bladder incontinence, no bowel incontinence, no chest pain, no tingling and no weakness   Abdominal pain:    Location:  Epigastric  Pt had sudden onset back pain at 6 pm last night. He was cutting grass earlier in the day. Pt also complains of a cold that he has had for 2x weeksDenies bowel and bladder incontinence. No falls. No prior occurences. Pt has a hx of heart bypass, 2 lage aortic aneurysms, and multiple HA's, pt's prostate was also trimmed.   Past Medical History  Diagnosis Date  . AAA (abdominal aortic aneurysm)     a. CTA 09/2011: 4.1-4.2cm infrarenal AAA.  Marland Kitchen Aneurysm of thoracic aorta 1995    a. Rupture 1995 w/spontaneous resolution;  b. CTA 09/2011: 6.8cm dist, 5.7cm mid  . Popliteal artery aneurysm   . Myocardial infarction   . Ischemic cardiomyopathy     10/2011 ECHO: EF 30-35%, apical akinesis  . Paroxysmal atrial fibrillation   . Hypertension   . Peripheral vascular disease   . Dyslipidemia   . Diabetes mellitus 2007    dx'd 2007 w/persisting FBS>125  . GERD (gastroesophageal reflux disease)   . Hiatal hernia   . Diverticular disease   .  BPH (benign prostatic hypertrophy)     with hx of acute urinary retention (pt can self-cath)  . COPD (chronic obstructive pulmonary disease)     05/09/08 PFT FEV1 1.84 (55%), FVC 4.42 (95%), TLC 6.46 (92%), DLCO 72%, no BD response.  Overnight oximetry 10/2011 normal on RA.  Marland Kitchen Pneumonia 1/09  . Allergic rhinitis   . Shingles     X 2 episodes, both in left scapula area  . OSA (obstructive sleep apnea) 06/14/09    PSG RDI 17, PLMI 96, intolerant of CPAP or BPAP  . Chronic renal insufficiency     Baseline Cr 1.5  . Prostate cancer 07/2008    Low grade; watchful waiting with [name]  . Pulmonary nodule, left 03/02/2010    Left base; resolved on f/u CT  . Allergic rhinitis   . Carpal tunnel syndrome of left wrist 02/11/2011  . IMPLANTATION OF DEFIBRILLATOR, HX OF 09/02/2008    Medtronic 7232Cx Maximo V. Had a 6949 lead - failed and therapies turned off.  . IRON DEFICIENCY 06/08/2010    Hemoccult negative:  GI ([name]) recommended no invasive diagnostics--watchful waiting (2012).  . UNSPECIFIED ANEMIA 06/01/2010    Multifactorial: iron def, CRI, vit B12 def,   . Hypotension 04/14/2012  . CHF (congestive heart failure)   . Atrial fibrillation   . CAD (coronary artery disease)  post CABG with prior percutaneous intervention of the saphenous vein graft with known saphenous vein graft disease.  . Automatic implantable cardioverter-defibrillator in situ     turned off per office visit note of Dr Excell Seltzer   . Hypothyroidism   . Shortness of breath   . Arthritis    Past Surgical History  Procedure Laterality Date  . Cardiac defibrillator placement  08/28/2004    [name]  . Colon surgery  1995    partial colectomy  . Coronary artery bypass graft  1996  . Aorta - bilateral femoral artery bypass graft  1999    [name] Early  . Nose surgery    . Insertion of pacing lead      New rate sensing pacing lead with removal of a previous implanted ICD and insertion of a device  back in the pocket with defibrillation threshold testing  . Abdominal aortic aneurysm repair  1999    Ingrarenal repair/ graft  . Stents      Placed to the saphenous vein graft  . Radiofrequency ablation  2006    A flutter  . Esophagogastroduodenoscopy (egd) with esophageal dilation  05/28/2012    Procedure: ESOPHAGOGASTRODUODENOSCOPY (EGD) WITH ESOPHAGEAL DILATION;  Surgeon: Rachael Fee, MD;  Location: [facility];  Service: Endoscopy;  Laterality: N/A;  . Coronary angioplasty    . Coronary stents     . Transurethral resection of prostate N/A 12/04/2012    Procedure: BIPOLAR TRANSURETHRAL RESECTION OF THE PROSTATE WITH GYRUS INSTRUMENTS;  Surgeon: Antony Haste, MD;  Location: [facility];  Service: Urology;  Laterality: N/A;   Family History  Problem Relation Age of Onset  . Stroke Mother 54  . Heart attack Mother     CVA  . Cancer Father 71    Lung  . Heart attack Father 70  . Lung cancer Father   . Coronary artery disease Other     2 of 5 siblings with CAD   History  Substance Use Topics  . Smoking status: Former Smoker -- 1.00 packs/day for 30 years    Types: Cigarettes    Quit date: 05/23/2003  . Smokeless tobacco: Former Neurosurgeon    Types: Chew    Quit date: 05/14/2003     Comment: 80 pack a year history  . Alcohol Use: 0.6 oz/week    1 Cans of beer per week     Comment: occasionally    Review of Systems  Cardiovascular: Negative for chest pain.  Gastrointestinal: Positive for abdominal pain. Negative for bowel incontinence.  Genitourinary: Negative for bladder incontinence.  Musculoskeletal: Positive for back pain.  Neurological: Negative for tingling and weakness.  All other systems reviewed and are negative.    Allergies  Atorvastatin; Crestor; Dilaudid; Lisinopril; Morphine; Prednisone; Trazodone and nefazodone; and Zolpidem tartrate  Home Medications   Current Outpatient Rx  Name  Route  Sig  Dispense  Refill  . acetaminophen (TYLENOL) 500 MG  tablet   Oral   Take 1,000 mg by mouth every 6 (six) hours as needed. For headache, pain or fever.         Marland Kitchen albuterol (PROVENTIL) (2.5 MG/3ML) 0.083% nebulizer solution   Nebulization   Take 3 mLs (2.5 mg total) by nebulization 4 (four) times daily as needed. Dx 496   360 mL   5   . amiodarone (PACERONE) 200 MG tablet   Oral   Take 1 tablet (200 mg total) by mouth every morning.   90 tablet  3   . aspirin EC 81 MG tablet   Oral   Take 81 mg by mouth every morning.         . budesonide (PULMICORT) 0.25 MG/2ML nebulizer solution   Nebulization   Take 2 mLs (0.25 mg total) by nebulization 2 (two) times daily.   120 mL   5     Dx 496   . carvedilol (COREG) 3.125 MG tablet   Oral   Take 3.125 mg by mouth 2 (two) times daily with a meal.         . cetirizine (ZYRTEC) 10 MG tablet   Oral   Take 10 mg by mouth every morning.          . cyanocobalamin 1000 MCG/ML injection   Intramuscular   Inject 1,000 mcg into the muscle every 30 (thirty) days.          . finasteride (PROSCAR) 5 MG tablet   Oral   Take 5 mg by mouth at bedtime.          . furosemide (LASIX) 40 MG tablet   Oral   Take 1 tablet (40 mg total) by mouth every morning.   30 tablet   0     Patient must have an Office Visit before getting a ...   . hyoscyamine (LEVSIN SL) 0.125 MG SL tablet   Sublingual   Place 1 tablet (0.125 mg total) under the tongue every 4 (four) hours as needed for cramping.   30 tablet   0   . isosorbide mononitrate (IMDUR) 60 MG 24 hr tablet   Oral   Take 60 mg by mouth every morning.         . levalbuterol (XOPENEX HFA) 45 MCG/ACT inhaler   Inhalation   Inhale 1-2 puffs into the lungs every 4 (four) hours as needed for wheezing or shortness of breath.         . levothyroxine (SYNTHROID, LEVOTHROID) 125 MCG tablet   Oral   Take 0.5-1 tablets (62.5-125 mcg total) by mouth every other day. Alternate daily with a whole tablet ( ) or a half a tablet  (6.62mcg)   30 tablet   5   . mirtazapine (REMERON) 15 MG tablet   Oral   Take 1 tablet (15 mg total) by mouth at bedtime.   30 tablet   3   . nitrofurantoin (MACRODANTIN) 50 MG capsule   Oral   Take 50 mg by mouth daily.         . nitroGLYCERIN (NITROSTAT) 0.3 MG SL tablet   Sublingual   Place 1 tablet (0.3 mg total) under the tongue every 5 (five) minutes as needed. For chest pain   25 tablet   2   . omeprazole (PRILOSEC OTC) 20 MG tablet   Oral   Take 20 mg by mouth daily.         Marland Kitchen PERFOROMIST 20 MCG/2ML nebulizer solution      TAKE 2 MLS (20 MCG TOTAL) BY NEBULIZATION 2 (TWO) TIMES DAILY.   60 mL   5   . sitaGLIPtin (JANUVIA) 100 MG tablet   Oral   Take 100 mg by mouth daily.         . Tamsulosin HCl (FLOMAX) 0.4 MG CAPS   Oral   Take 0.8 mg by mouth every morning.          . tiotropium (SPIRIVA) 18 MCG inhalation capsule   Inhalation   Place 18 mcg into inhaler  and inhale daily.         Marland Kitchen warfarin (COUMADIN) 5 MG tablet      Takes 1/2 tablet every day except Monday he takes 1 tablet-->HOLD until no further blood in urine.   30 tablet   0    BP 121/80  Pulse 88  Temp(Src) 97.9 F (36.6 C) (Oral)  Resp 18  Ht 5\' 11"  (1.803 m)  Wt 155 lb (70.308 kg)  BMI 21.63 kg/m2  SpO2 99% Physical Exam  Nursing note and vitals reviewed. Constitutional: He is oriented to person, place, and time. He appears well-developed and well-nourished. No distress.  HENT:  Head: Normocephalic and atraumatic.  Eyes: EOM are normal.  Neck: Neck supple. No tracheal deviation present.  Cardiovascular: Normal rate.   Pulmonary/Chest: Effort normal. No respiratory distress.  Abdominal: Soft. There is tenderness.  Musculoskeletal: Normal range of motion. He exhibits tenderness.  Neurological: He is alert and oriented to person, place, and time.  Skin: Skin is warm and dry.  Psychiatric: He has a normal mood and affect. His behavior is normal.    ED Course   Procedures (including critical care time)  DIAGNOSTIC STUDIES: Oxygen Saturation is 99% on RA, normal by my interpretation.    COORDINATION OF CARE:  9:08 PM- Pt advised of plan for treatment including medication for pain and CT of aneurysms and pt agrees.    Labs Review Labs Reviewed - No data to display Imaging Review No results found.  EKG Interpretation   None       MDM  No diagnosis found. Patient is a 77 year old male with extensive past medical history including known thoracoabdominal aortic aneurysm. He has multiple other comorbidities including coronary artery disease with history of bypass, COPD, chronic renal insufficiency, and prior infrarenal aneurysm repair. He presents today with complaints of severe pain in his mid back that started suddenly this afternoon.  My concerns were immediately of this aneurysm and for the potential of rupture. Laboratory studies were obtained and the patient was sent for a CT angio of the abdomen. Unfortunately this revealed an increase in the size of the aneurysm along with rupture and retroperitoneal bleeding. I discussed the case with Dr. Myra Gianotti from vascular surgery who reviewed the CT scan and the patient's medical record. It was decided over a year ago that the patient was a poor operative risk and would most likely not survive the operation, and if he did would likely develop either renal failure requiring dialysis for lower extremity paralysis. He decided that he would not pursue operative treatment and understands that if the aneurysm ruptures there will be fatal. This was in Dr. Remonia Richter notes from February of 2014. He was also referred to Dr. Pattricia Boss at [facility] who specializes in difficult aneurysm repairs. From reading the notes it was determined his opinion was the same.   The patient initially was inquiring if something could be done. I discussed this with Dr. Dr. Myra Gianotti he did not feel as though the patient could survive any  procedure that could be offered at [facility] and requested I speak with the vascular surgeons at [facility]. I then called and spoke with Dr. Carlyon Shadow was on call for vascular surgery. He reviewed the patient's records and imaging studies that were on file there and agreed that this was a very complicated aneurysm. He stated that repair electively was fraught with complications and quite likely death, and the fact that the aneurysm had ruptured only magnified these risks. I again  spoke with the patient regarding the situation and it was his desire that he kept comfortable. He has declined surgical repair and is requesting to be made a DO NOT RESUSCITATE. I again spoke with Dr. Myra Gianotti who requested I admit the patient to the hospitalist. Dr. Toniann Fail agrees to admit.  I have updated the patient and the family of the plan and they understand that the patient will likely not survive.  CRITICAL CARE Performed by: Geoffery Lyons Total critical care time: 60 minutes Critical care time was exclusive of separately billable procedures and treating other patients. Critical care was necessary to treat or prevent imminent or life-threatening deterioration. Critical care was time spent personally by me on the following activities: development of treatment plan with patient and/or surrogate as well as nursing, discussions with consultants, evaluation of patient's response to treatment, examination of patient, obtaining history from patient or surrogate, ordering and performing treatments and interventions, ordering and review of laboratory studies, ordering and review of radiographic studies, pulse oximetry and re-evaluation of patient's condition.   I personally performed the services described in this documentation, which was scribed in my presence. The recorded information has been reviewed and is accurate.     Geoffery Lyons, MD 03-05-13 [phone]

## 2013-02-20 NOTE — ED Notes (Signed)
Pt continually c/o back pain, requesting pain medication.  Dr. Judd Lien aware, notified wife that he would like to wait for the results of the CT scan prior to giving any further medication.  Patient aware.

## 2013-02-20 NOTE — ED Notes (Signed)
Patient with lower back pain and bilateral shoulder pain.  Patient has had a cold for 2 weeks with coughing.  Denies any urinary symptoms.

## 2013-02-20 NOTE — ED Notes (Signed)
Pt placed on continuous cardiac monitoring, pulse oximetry.  NSR 90's.

## 2013-02-20 NOTE — ED Notes (Signed)
Pt has returned from CT.  Family at bedside.  Still c/o worsening pain.  Dr. Judd Lien is pulling up CT results.  VS on patient obtained.

## 2013-02-20 NOTE — ED Notes (Signed)
Vitamin K 10 mg IV in D5 50 ml bag given over 60 minutes.  Only one dose given, RN had to mix this medication-not available pre mixed as originally ordered.

## 2013-02-20 NOTE — ED Notes (Signed)
Patient transported to CT via stretcher per tech. 

## 2013-02-21 ENCOUNTER — Encounter (HOSPITAL_COMMUNITY): Payer: Self-pay | Admitting: *Deleted

## 2013-02-21 DIAGNOSIS — I713 Abdominal aortic aneurysm, ruptured: Secondary | ICD-10-CM | POA: Diagnosis present

## 2013-02-21 DIAGNOSIS — I10 Essential (primary) hypertension: Secondary | ICD-10-CM

## 2013-02-21 LAB — GLUCOSE, CAPILLARY: Glucose-Capillary: 175 mg/dL — ABNORMAL HIGH (ref 70–99)

## 2013-02-21 MED ORDER — BUDESONIDE 0.25 MG/2ML IN SUSP
0.2500 mg | Freq: Two times a day (BID) | RESPIRATORY_TRACT | Status: DC
  Filled 2013-02-21 (×2): qty 2

## 2013-02-21 MED ORDER — HYOSCYAMINE SULFATE 0.125 MG SL SUBL
0.1250 mg | SUBLINGUAL_TABLET | SUBLINGUAL | Status: DC | PRN
  Filled 2013-02-21: qty 1

## 2013-02-21 MED ORDER — ALBUTEROL SULFATE (5 MG/ML) 0.5% IN NEBU: 2.5000 mg | INHALATION_SOLUTION | RESPIRATORY_TRACT | Status: DC | PRN

## 2013-02-21 MED ORDER — FENTANYL CITRATE 0.05 MG/ML IJ SOLN
100.0000 ug | Freq: Once | INTRAMUSCULAR | Status: AC
  Administered 2013-02-21: 100 ug via INTRAVENOUS
  Filled 2013-02-21: qty 2

## 2013-02-21 MED ORDER — DIPHENHYDRAMINE HCL 50 MG/ML IJ SOLN: 12.5000 mg | Freq: Four times a day (QID) | INTRAMUSCULAR | Status: DC | PRN

## 2013-02-21 MED ORDER — LEVALBUTEROL TARTRATE 45 MCG/ACT IN AERO: 1.0000 | INHALATION_SPRAY | RESPIRATORY_TRACT | Status: DC | PRN

## 2013-02-21 MED ORDER — FENTANYL CITRATE 0.05 MG/ML IJ SOLN: 50.0000 ug | INTRAMUSCULAR | Status: DC | PRN

## 2013-02-21 MED ORDER — AMIODARONE HCL 200 MG PO TABS
200.0000 mg | ORAL_TABLET | Freq: Every morning | ORAL | Status: DC
  Filled 2013-02-21: qty 1

## 2013-02-21 MED ORDER — ISOSORBIDE MONONITRATE ER 60 MG PO TB24
60.0000 mg | ORAL_TABLET | Freq: Every morning | ORAL | Status: DC
  Filled 2013-02-21: qty 1

## 2013-02-21 MED ORDER — DIPHENHYDRAMINE HCL 12.5 MG/5ML PO ELIX
12.5000 mg | ORAL_SOLUTION | Freq: Four times a day (QID) | ORAL | Status: DC | PRN
  Filled 2013-02-21: qty 5

## 2013-02-21 MED ORDER — FENTANYL CITRATE 0.05 MG/ML IJ SOLN
50.0000 ug | Freq: Once | INTRAMUSCULAR | Status: AC
  Administered 2013-02-21: 50 ug via INTRAVENOUS
  Filled 2013-02-21: qty 2

## 2013-02-21 MED ORDER — TIOTROPIUM BROMIDE MONOHYDRATE 18 MCG IN CAPS
18.0000 ug | ORAL_CAPSULE | Freq: Every day | RESPIRATORY_TRACT | Status: DC
  Filled 2013-02-21: qty 5

## 2013-02-21 MED ORDER — ARFORMOTEROL TARTRATE 15 MCG/2ML IN NEBU
15.0000 ug | INHALATION_SOLUTION | Freq: Two times a day (BID) | RESPIRATORY_TRACT | Status: DC
  Filled 2013-02-21 (×3): qty 2

## 2013-02-21 MED ORDER — TAMSULOSIN HCL 0.4 MG PO CAPS
0.8000 mg | ORAL_CAPSULE | Freq: Every morning | ORAL | Status: DC
  Filled 2013-02-21: qty 2

## 2013-02-21 MED ORDER — PANTOPRAZOLE SODIUM 40 MG PO TBEC
40.0000 mg | DELAYED_RELEASE_TABLET | Freq: Every day | ORAL | Status: DC
  Filled 2013-02-21: qty 1

## 2013-02-21 MED ORDER — MIRTAZAPINE 15 MG PO TABS
15.0000 mg | ORAL_TABLET | Freq: Every day | ORAL | Status: DC
  Filled 2013-02-21: qty 1

## 2013-02-21 MED ORDER — FUROSEMIDE 40 MG PO TABS
40.0000 mg | ORAL_TABLET | Freq: Every morning | ORAL | Status: DC
  Filled 2013-02-21: qty 1

## 2013-02-21 MED ORDER — FENTANYL CITRATE 0.05 MG/ML IJ SOLN: 50.0000 ug | Freq: Once | INTRAMUSCULAR | Status: AC

## 2013-02-21 MED ORDER — HYDROCODONE-ACETAMINOPHEN 5-325 MG PO TABS: 1.0000 | ORAL_TABLET | ORAL | Status: DC | PRN

## 2013-02-21 MED ORDER — FENTANYL 10 MCG/ML IV SOLN
INTRAVENOUS | Status: DC
  Filled 2013-02-21: qty 50

## 2013-02-21 MED ORDER — FENTANYL CITRATE 0.05 MG/ML IJ SOLN
25.0000 ug | INTRAMUSCULAR | Status: DC | PRN
  Administered 2013-02-21: 50 ug via INTRAVENOUS
  Filled 2013-02-21: qty 2

## 2013-02-21 MED ORDER — ONDANSETRON HCL 4 MG/2ML IJ SOLN: 4.0000 mg | Freq: Three times a day (TID) | INTRAMUSCULAR | Status: DC | PRN

## 2013-02-21 MED ORDER — DOCUSATE SODIUM 100 MG PO CAPS: 100.0000 mg | ORAL_CAPSULE | Freq: Two times a day (BID) | ORAL | Status: DC

## 2013-02-21 MED ORDER — GUAIFENESIN 100 MG/5ML PO SYRP
200.0000 mg | ORAL_SOLUTION | ORAL | Status: DC | PRN
  Filled 2013-02-21: qty 10

## 2013-02-21 MED ORDER — GUAIFENESIN ER 600 MG PO TB12
600.0000 mg | ORAL_TABLET | Freq: Two times a day (BID) | ORAL | Status: DC
  Filled 2013-02-21 (×2): qty 1

## 2013-02-21 MED ORDER — ONDANSETRON HCL 4 MG/2ML IJ SOLN: 4.0000 mg | Freq: Four times a day (QID) | INTRAMUSCULAR | Status: DC | PRN

## 2013-02-21 MED ORDER — CARVEDILOL 3.125 MG PO TABS
3.1250 mg | ORAL_TABLET | Freq: Two times a day (BID) | ORAL | Status: DC
  Filled 2013-02-21 (×3): qty 1

## 2013-02-21 MED ORDER — ACETAMINOPHEN 650 MG RE SUPP: 650.0000 mg | Freq: Four times a day (QID) | RECTAL | Status: DC | PRN

## 2013-02-21 MED ORDER — ACETAMINOPHEN 325 MG PO TABS: 650.0000 mg | ORAL_TABLET | Freq: Four times a day (QID) | ORAL | Status: DC | PRN

## 2013-03-08 ENCOUNTER — Ambulatory Visit: Payer: Medicare Other

## 2013-03-10 ENCOUNTER — Ambulatory Visit: Payer: Medicare Other

## 2013-03-13 NOTE — ED Notes (Signed)
Dr. Judd Lien has been at bedside, explaining the options available to the patient.  The patient has decided to remain comfort care.  Family has gathered at bedside.

## 2013-03-13 NOTE — ED Notes (Signed)
Pt still c/o pain to lower back, Dr. Judd Lien notified and order for 3rd dose of Fentanyl received.  Family and patient also verbalize that they don't want Advanced Life Saving Measures done in the event that patient would have a life-ending event.  (i.e. Ventilator, chest compressions).

## 2013-03-13 NOTE — H&P (Signed)
PCP:  Jeoffrey Massed, MD    Chief Complaint:  Back pain  HPI: Roberto [name] is a 77 y.o. male   has a past medical history of AAA (abdominal aortic aneurysm); Aneurysm of thoracic aorta (1995); Popliteal artery aneurysm; Myocardial infarction; Ischemic cardiomyopathy; Paroxysmal atrial fibrillation; Hypertension; Peripheral vascular disease; Dyslipidemia; Diabetes mellitus (2007); GERD (gastroesophageal reflux disease); Hiatal hernia; Diverticular disease; BPH (benign prostatic hypertrophy); COPD (chronic obstructive pulmonary disease); Pneumonia (1/09); Allergic rhinitis; Shingles; OSA (obstructive sleep apnea) (06/14/09); Chronic renal insufficiency; Prostate cancer (07/2008); Pulmonary nodule, left (03/02/2010); Allergic rhinitis; Carpal tunnel syndrome of left wrist (02/11/2011); IMPLANTATION OF DEFIBRILLATOR, HX OF (09/02/2008); IRON DEFICIENCY (06/08/2010); UNSPECIFIED ANEMIA (06/01/2010); Hypotension (04/14/2012); CHF (congestive heart failure); Atrial fibrillation; CAD (coronary artery disease); Automatic implantable cardioverter-defibrillator in situ; Hypothyroidism; Shortness of breath; and Arthritis.   Presented with  48 hour of back pain after doing yard work and and walking. He had severe back pain sudden in onset. It continued to hurt severy and he presented to [facility]. He has hx of AAA and CT scan was done showing aortic aneurism rapture.  Review of records Dr. Judd Lien with emergency department and had spent extensive time discussing care with vascular surgery who felt that patient is not going to be a candidate for any intervention. Pateitn elected comfort care as he was not a candidate for operative repair. Currently patient continues to have severe back pain and requesting PCA.  Review of Systems:    Pertinent positives include:  non-productive cough, back pain  Constitutional:  No weight loss, night sweats, Fevers, chills, fatigue, weight loss  HEENT:  No headaches, Difficulty  swallowing,Tooth/dental problems,Sore throat,  No sneezing, itching, ear ache, nasal congestion, post nasal drip,  Cardio-vascular:  No chest pain, Orthopnea, PND, anasarca, dizziness, palpitations.no Bilateral lower extremity swelling  GI:  No heartburn, indigestion, abdominal pain, nausea, vomiting, diarrhea, change in bowel habits, loss of appetite, melena, blood in stool, hematemesis Resp:  no shortness of breath at rest. No dyspnea on exertion, No excess mucus, no productive cough, No No coughing up of blood.No change in color of mucus.No wheezing. Skin:  no rash or lesions. No jaundice GU:  no dysuria, change in color of urine, no urgency or frequency. No straining to urinate.  No flank pain.  Musculoskeletal:  No joint pain or no joint swelling. No decreased range of motion. No back pain.  Psych:  No change in mood or affect. No depression or anxiety. No memory loss.  Neuro: no localizing neurological complaints, no tingling, no weakness, no double vision, no gait abnormality, no slurred speech, no confusion  Otherwise ROS are negative except for above, 10 systems were reviewed  Past Medical History: Past Medical History  Diagnosis Date  . AAA (abdominal aortic aneurysm)     a. CTA 09/2011: 4.1-4.2cm infrarenal AAA.  Marland Kitchen Aneurysm of thoracic aorta 1995    a. Rupture 1995 w/spontaneous resolution;  b. CTA 09/2011: 6.8cm dist, 5.7cm mid  . Popliteal artery aneurysm   . Myocardial infarction   . Ischemic cardiomyopathy     10/2011 ECHO: EF 30-35%, apical akinesis  . Paroxysmal atrial fibrillation   . Hypertension   . Peripheral vascular disease   . Dyslipidemia   . Diabetes mellitus 2007    dx'd 2007 w/persisting FBS>125  . GERD (gastroesophageal reflux disease)   . Hiatal hernia   . Diverticular disease   . BPH (benign prostatic hypertrophy)     with hx of acute urinary retention (pt can self-cath)  .  COPD (chronic obstructive pulmonary disease)     05/09/08 PFT FEV1 1.84  (55%), FVC 4.42 (95%), TLC 6.46 (92%), DLCO 72%, no BD response.  Overnight oximetry 10/2011 normal on RA.  Marland Kitchen Pneumonia 1/09  . Allergic rhinitis   . Shingles     X 2 episodes, both in left scapula area  . OSA (obstructive sleep apnea) 06/14/09    PSG RDI 17, PLMI 96, intolerant of CPAP or BPAP  . Chronic renal insufficiency     Baseline Cr 1.5  . Prostate cancer 07/2008    Low grade; watchful waiting with [name]  . Pulmonary nodule, left 03/02/2010    Left base; resolved on f/u CT  . Allergic rhinitis   . Carpal tunnel syndrome of left wrist 02/11/2011  . IMPLANTATION OF DEFIBRILLATOR, HX OF 09/02/2008    Medtronic 7232Cx Maximo V. Had a 6949 lead - failed and therapies turned off.  . IRON DEFICIENCY 06/08/2010    Hemoccult negative:  GI ([name]) recommended no invasive diagnostics--watchful waiting (2012).  . UNSPECIFIED ANEMIA 06/01/2010    Multifactorial: iron def, CRI, vit B12 def,   . Hypotension 04/14/2012  . CHF (congestive heart failure)   . Atrial fibrillation   . CAD (coronary artery disease)     post CABG with prior percutaneous intervention of the saphenous vein graft with known saphenous vein graft disease.  . Automatic implantable cardioverter-defibrillator in situ     turned off per office visit note of Dr Excell Seltzer   . Hypothyroidism   . Shortness of breath   . Arthritis    Past Surgical History  Procedure Laterality Date  . Cardiac defibrillator placement  08/28/2004    [name]  . Colon surgery  1995    partial colectomy  . Coronary artery bypass graft  1996  . Aorta - bilateral femoral artery bypass graft  1999    [name] Early  . Nose surgery    . Insertion of pacing lead      New rate sensing pacing lead with removal of a previous implanted ICD and insertion of a device back in the pocket with defibrillation threshold testing  . Abdominal aortic aneurysm repair  1999    Ingrarenal repair/ graft  . Stents      Placed to the saphenous vein  graft  . Radiofrequency ablation  2006    A flutter  . Esophagogastroduodenoscopy (egd) with esophageal dilation  05/28/2012    Procedure: ESOPHAGOGASTRODUODENOSCOPY (EGD) WITH ESOPHAGEAL DILATION;  Surgeon: Rachael Fee, MD;  Location: [facility];  Service: Endoscopy;  Laterality: N/A;  . Coronary angioplasty    . Coronary stents     . Transurethral resection of prostate N/A 12/04/2012    Procedure: BIPOLAR TRANSURETHRAL RESECTION OF THE PROSTATE WITH GYRUS INSTRUMENTS;  Surgeon: Antony Haste, MD;  Location: [facility];  Service: Urology;  Laterality: N/A;     Medications: Prior to Admission medications   Medication Sig Start Date End Date Taking? Authorizing Provider  acetaminophen (TYLENOL) 500 MG tablet Take 1,000 mg by mouth every 6 (six) hours as needed. For headache, pain or fever.    Historical Provider, MD  albuterol (PROVENTIL) (2.5 MG/3ML) 0.083% nebulizer solution Take 3 mLs (2.5 mg total) by nebulization 4 (four) times daily as needed. Dx 496 09/29/12   Coralyn Helling, MD  amiodarone (PACERONE) 200 MG tablet Take 1 tablet (200 mg total) by mouth every morning. 10/09/12   Herby Abraham, MD  aspirin EC  81 MG tablet Take 81 mg by mouth every morning.    Historical Provider, MD  budesonide (PULMICORT) 0.25 MG/2ML nebulizer solution Take 2 mLs (0.25 mg total) by nebulization 2 (two) times daily. 09/29/12   Coralyn Helling, MD  carvedilol (COREG) 3.125 MG tablet Take 3.125 mg by mouth 2 (two) times daily with a meal.    Historical Provider, MD  cetirizine (ZYRTEC) 10 MG tablet Take 10 mg by mouth every morning.     Historical Provider, MD  cyanocobalamin 1000 MCG/ML injection Inject 1,000 mcg into the muscle every 30 (thirty) days.     Historical Provider, MD  finasteride (PROSCAR) 5 MG tablet Take 5 mg by mouth at bedtime.     Historical Provider, MD  furosemide (LASIX) 40 MG tablet Take 1 tablet (40 mg total) by mouth every morning. 01/19/13   Jeoffrey Massed, MD  hyoscyamine  (LEVSIN SL) 0.125 MG SL tablet Place 1 tablet (0.125 mg total) under the tongue every 4 (four) hours as needed for cramping. 12/04/12   Antony Haste, MD  isosorbide mononitrate (IMDUR) 60 MG 24 hr tablet Take 60 mg by mouth every morning.    Historical Provider, MD  levalbuterol [facility] HFA) 45 MCG/ACT inhaler Inhale 1-2 puffs into the lungs every 4 (four) hours as needed for wheezing or shortness of breath.    Historical Provider, MD  levothyroxine (SYNTHROID, LEVOTHROID) 125 MCG tablet Take 0.5-1 tablets (62.5-125 mcg total) by mouth every other day. Alternate daily with a whole tablet ( ) or a half a tablet (6.21mcg) 09/01/12   Jeoffrey Massed, MD  mirtazapine (REMERON) 15 MG tablet Take 1 tablet (15 mg total) by mouth at bedtime. 01/26/13   Jeoffrey Massed, MD  nitrofurantoin (MACRODANTIN) 50 MG capsule Take 50 mg by mouth daily.    Historical Provider, MD  nitroGLYCERIN (NITROSTAT) 0.3 MG SL tablet Place 1 tablet (0.3 mg total) under the tongue every 5 (five) minutes as needed. For chest pain 05/04/12   Herby Abraham, MD  omeprazole (PRILOSEC OTC) 20 MG tablet Take 20 mg by mouth daily.    Historical Provider, MD  PERFOROMIST 20 MCG/2ML nebulizer solution TAKE 2 MLS (20 MCG TOTAL) BY NEBULIZATION 2 (TWO) TIMES DAILY. 01/13/13   Coralyn Helling, MD  sitaGLIPtin (JANUVIA) 100 MG tablet Take 100 mg by mouth daily.    Historical Provider, MD  Tamsulosin HCl (FLOMAX) 0.4 MG CAPS Take 0.8 mg by mouth every morning.     Historical Provider, MD  tiotropium (SPIRIVA) 18 MCG inhalation capsule Place 18 mcg into inhaler and inhale daily.    Historical Provider, MD  warfarin (COUMADIN) 5 MG tablet Takes 1/2 tablet every day except Monday he takes 1 tablet-->HOLD until no further blood in urine. 12/10/12   Simonne Martinet, NP    Allergies:   Allergies  Allergen Reactions  . Atorvastatin     REACTION: muscle ache in legs  . Crestor [Rosuvastatin Calcium] Other (See Comments)    Lower ext  fatigue and soreness- if taken everyday  . Dilaudid [Hydromorphone Hcl]     "drunk"  . Lisinopril Diarrhea  . Morphine     REACTION: AMS - agitation  . Prednisone     REACTION: Can't breath  . Trazodone And Nefazodone Other (See Comments)    "drunk"  . Zolpidem Tartrate     REACTION: AMS    Social History:  Ambulatory   independently   Lives at   home   reports that  he quit smoking about 9 years ago. His smoking use included Cigarettes. He has a 30 pack-year smoking history. He quit smokeless tobacco use about 9 years ago. His smokeless tobacco use included Chew. He reports that he drinks about 0.6 ounces of alcohol per week. He reports that he does not use illicit drugs.   Family History: family history includes Cancer (age of onset: 64) in his father; Coronary artery disease in his other; Heart attack in his mother; Heart attack (age of onset: 10) in his father; Lung cancer in his father; Stroke (age of onset: 36) in his mother.    Physical Exam: Patient Vitals for the past 24 hrs:  BP Temp Temp src Pulse Resp SpO2 Height Weight  Mar 02, 2013 0247 128/80 mmHg 97.4 F (36.3 C) Oral 83 20 97 % 5' 1.32" (1.558 m) 74.4 kg (164 lb 0.4 oz)  Mar 02, 2013 0200 - - - 85 23 98 % - -  Mar 02, 2013 0158 135/80 mmHg - - - 18 99 % - -  03/02/13 0137 104/66 mmHg - - - - - - -  2013/03/02 0100 133/83 mmHg - - 89 18 99 % - -  03-02-2013 0007 119/68 mmHg - - - 23 95 % - -  03/01/2013 2345 113/83 mmHg - - - 17 97 % - -  03/12/2013 2304 139/85 mmHg - - - - - - -  03/09/2013 2303 158/92 mmHg - - 90 18 98 % - -  03/10/2013 1959 121/80 mmHg 97.9 F (36.6 C) Oral 88 18 99 % 5\' 11"  (1.803 m) 70.308 kg (155 lb)  03/09/2013 1956 - - - - - 96 % - -    1. General:  in No Acute distress 2. Psychological: Alert and  Oriented 3. Head/ENT:    Dry Mucous Membranes                          Head Non traumatic, neck supple                          Normal Dentition 4. SKIN:   decreased Skin turgor,  Skin clean Dry and intact no  rash 5. Heart: Regular rate and rhythm no Murmur, Rub or gallop 6. Lungs: no wheezes,  crackles  noted bilaterally 7. Abdomen: Soft, non-tender, Non distended 8. Lower extremities: no clubbing, cyanosis, or edema 9. Neurologically Grossly intact, moving all 4 extremities equally 10. MSK: Normal range of motion  body mass index is 30.65 kg/(m^2).   Labs on Admission:   Recent Labs  03/04/2013 2115  NA 139  K 3.6  CL 103  CO2 24  GLUCOSE 196*  BUN 28*  CREATININE 1.60*  CALCIUM 9.9    Recent Labs  02/14/2013 2115  AST 12  ALT 14  ALKPHOS 87  BILITOT 0.5  PROT 7.2  ALBUMIN 3.7   No results found for this basename: LIPASE, AMYLASE,  in the last 72 hours  Recent Labs  03/09/2013 2115  WBC 11.6*  NEUTROABS 10.1*  HGB 9.2*  HCT 30.1*  MCV 81.8  PLT 173   No results found for this basename: CKTOTAL, CKMB, CKMBINDEX, TROPONINI,  in the last 72 hours No results found for this basename: TSH, T4TOTAL, FREET3, T3FREE, THYROIDAB,  in the last 72 hours No results found for this basename: VITAMINB12, FOLATE, FERRITIN, TIBC, IRON, RETICCTPCT,  in the last 72 hours Lab Results  Component Value Date  HGBA1C 6.1* 12/05/2012    Estimated Creatinine Clearance: 35.3 ml/min (by C-G formula based on Cr of 1.6). ABG    Component Value Date/Time   PHART 7.454* 12/07/2012 0334   HCO3 27.2* 12/07/2012 0334   TCO2 26.0 12/07/2012 0334   ACIDBASEDEF 10.1* 12/05/2012 1500   O2SAT 92.9 12/07/2012 0334     Lab Results  Component Value Date   DDIMER  Value: 1.74        AT THE INHOUSE ESTABLISHED CUTOFF VALUE OF 0.48 ug/mL FEU, THIS ASSAY HAS BEEN DOCUMENTED IN THE LITERATURE TO HAVE A SENSITIVITY AND NEGATIVE PREDICTIVE VALUE OF 100 PERCENT FOR DEEP VEIN THROMBOSIS AND PULMONARY EMBOLISM.* 05/25/2007      Cultures:    Component Value Date/Time   SDES BLOOD LEFT ARM 12/04/2012 2150   SPECREQUEST BOTTLES DRAWN AEROBIC AND ANAEROBIC 12/04/2012 2150   CULT NO GROWTH 5 DAYS 12/04/2012 2150    REPTSTATUS 12/11/2012 FINAL 12/04/2012 2150       Radiological Exams on Admission: Ct Cta Abd/pel W/cm &/or W/o Cm  03/17/13   CLINICAL DATA:  Acute low back and bilateral flank pain four hours ago. Known abdominal aortic aneurysm and thoracic aortic aneurysm. Bilateral iliac artery aneurysm repairs in 2008.  EXAM: CT ANGIOGRAPHY ABDOMEN AND PELVIS WITH CONTRAST AND WITHOUT CONTRAST  TECHNIQUE: Multidetector CT imaging of the abdomen and pelvis was performed using the standard protocol during bolus administration of intravenous contrast. Multiplanar reconstructed images including MIPs were obtained and reviewed to evaluate the vascular anatomy.  CONTRAST:  60mL OMNIPAQUE IOHEXOL 300 MG/ML  SOLN  COMPARISON:  12/05/2012.  FINDINGS: The previously demonstrated 7.1 x 6.7 cm lower thoracic and upper abdominal aortic aneurysm currently measures 7.8 x 7.1 cm and contains a dissection flap. There is interval soft tissue density surrounding the anterior and lateral portions of this aneurysm in the lower chest and upper abdomen. Small to moderate-sized bilateral pleural effusions are again demonstrated. An aortobifemoral bypass graft is again demonstrated with aneurysmal dilatation at the superior aspect of the graft, below the renal arteries. The aneurysm measures 6.1 x 5.5 cm on image number 82.  An inferior right lobe liver cyst is again demonstrated. Multiple colonic diverticula are also noted. A distal sigmoid colon anastomosis is noted. Normal appearing appendix. Unremarkable spleen, pancreas, gallbladder and urinary bladder. Moderately enlarged prostate gland with rounded central low density, suggesting a previous TURP. Small bilateral renal cysts. Stable left renal parapelvic cysts. Bilateral adrenal hyperplasia. Mild lumbar spine degenerative changes.  Review of the MIP images confirms the above findings.  IMPRESSION: 1. Ruptured thoracoabdominal aortic aneurysm, as described above. The aneurysm has  enlarged, currently measuring 7.8 cm in maximum diameter in the lower chest. The aneurysm also contains a dissection flap in the lower chest and extending into the upper abdomen. 2. Stable infrarenal abdominal aortic aneurysm at the superior margin of an aortobifemoral bypass graft. This aneurysm measures 6.1 cm in maximum diameter. 3. No significant change in small to moderate-sized bilateral pleural effusions. Critical Value/emergent results were discussed in person at the time of interpretation on March 17, 2013 at 11:35 PM with Dr.Brabham, who verbally acknowledged these results.   Electronically Signed   By: Gordan Payment M.D.   On: 03/17/13 23:47    Chart has been reviewed  Assessment/Plan  Patient is a 77 year old gentleman with Ruptured thoracoabdominal aortic aneurysm not amendable to repair currently on comfort care   Present on Admission:  . Ruptured abdominal aortic aneurysm - control blood pressure hold heparin  and warfarin. Very poor prognosis expected which patient understands and at this point elects to have comfort measures only. Would benefit from palliative care consult in a.m. for now we'll use fentanyl PCA as patient is allergic to Dilaudid and morphine.  Marland Kitchen HYPERTENSION - continue home medications to avoid sudden spike in blood pressure Cough and cold-like symptoms will give Mucinex and Tussionex as needed  Prophylaxis: Comfort care only  CODE STATUS: DNR/DNI comfort care measures  Other plan as per orders.  I have spent a total of 55 min on this admission  Jahaira Earnhart 03/03/2013, 3:30 AM

## 2013-03-13 NOTE — Progress Notes (Signed)
Verified with RR RN that patient passed at 05:37, and patient was breathless, bladder evacuated, skin mottled.  Patient still has heartbeat due to pacemaker.  Donor association notified, as well as MD.  Death certificate done.  Will comfort family. Roberto [name], Joan Mayans, RN

## 2013-03-13 NOTE — Discharge Summary (Signed)
Expiration Note/ Death Summary  Roberto [name]  MR#: [id number]  DOB:1935-10-24  Date of Admission: 12-Mar-2013 Date of Death: Mar 13, 2013 at 5:37 AM   Attending Physician:RAI,[email or web address]  Patient's PCP: Jeoffrey Massed, MD  Consults:  Vascular Surgery (Dr Myra Gianotti) at [facility] and Vascular surgery at [facility] also called by Dr Judd Lien (EDP)  Cause of Death: Ruptured abdominal aortic aneurysm  Secondary Diagnoses . known history of complicated thoracoabdominal aortic aneurysm- was considered non-operative . HYPERTENSION  CAD with ischemic cardiomyopathy  COPD  chronic renal insufficiency  atrial fibrillation   Brief H and P and hospital course:  [name] was a 77 year old male with known history of complicated thoracoabdominal aortic aneurysm- was considered non-operative, hypertension, CAD, COPD, chronic renal insufficiency, atrial fibrillation had presented to the [facility] ED with 48 hours of severe back pain, sudden in onset after doing yard work and walking. Due to his known history of large complicated thoraco-abdominal aortic aneurysm, CT scan was done which showed aortic aneurysm rupture, 7.8 cm with dissection flap in the lower chest and extending to the upper abdomen. Dr Judd Lien (EDP) had extensive discussions with vascular surgery at [facility], Dr. Myra Gianotti who did not feel that patient could survive any procedure at this time, requested vascular surgeons at [facility]. Dr Judd Lien spoke with Dr. Carlyon Shadow (on call for vascular surgery at [facility]) who reviewed the patient's records and imaging studies and agreed that this was very complicated aneurysm, repaired electively was fraught with complications and quite likely death and the fact that the aneurysm had ruptured only magnified the risks. Dr Judd Lien discussed in detail with the patient regarding the situation and patient requested comfort measures, DNR at this point. Patient was transferred to [facility] under hospitalist service with comfort  care measures. Patient passed on 03/13/13 at 5:37 AM today.   Patient had already expired prior to my encounter, death certificate was signed by NP, Lenny Pastel and I completed the death summary for the medical records.  SignedThad Ranger M.D. [facility] 13-Mar-2013, 7:16 AM Pager: [phone]

## 2013-03-13 NOTE — Significant Event (Signed)
Rapid Response Event Note Called by primary RN to see pt for decreased BP Overview: Time Called: 0521 Arrival Time: 0523 Event Type: Respiratory  Initial Focused Assessment: Pt is actively dying. Unable to palpate peripheral pulses, agonal respirations, cold extremities.  Spoke with family at bedside.  Pt is a DNR  Interventions:   Event Summary: Name of Physician Notified: Benedetto Coons, NP at 0530    at     Pt expired 0537.  Auscultated for heart sounds time 1 min per hospital policy, no heart sounds heard.     Roberto [name]

## 2013-03-13 NOTE — ED Notes (Signed)
Assigned to bed 5W25 @ [facility], [facility] called for transport, RN notified.

## 2013-03-13 NOTE — Progress Notes (Signed)
Roberto [name] [id number] Admitted to 4U98: 03/20/2013 3:34 AM Attending Provider: Eduard Clos, MD    Roberto [name] is a 77 y.o. male patient admitted from ED awake, alert  & orientated  X 3,  Prior, VSS - Blood pressure 128/80, pulse 83, temperature 97.4 F (36.3 C), temperature source Oral, resp. rate 20, height 5' 1.32" (1.558 m), weight 74.4 kg (164 lb 0.4 oz), SpO2 97.00%.RA, no c/o shortness of breath, no c/o chest pain, no distress noted.    IV sites WDL:  with a transparent dsg that's clean dry and intact.  Allergies:   Allergies  Allergen Reactions  . Atorvastatin     REACTION: muscle ache in legs  . Crestor [Rosuvastatin Calcium] Other (See Comments)    Lower ext fatigue and soreness- if taken everyday  . Dilaudid [Hydromorphone Hcl]     "drunk"  . Lisinopril Diarrhea  . Morphine     REACTION: AMS - agitation  . Prednisone     REACTION: Can't breath  . Trazodone And Nefazodone Other (See Comments)    "drunk"  . Zolpidem Tartrate     REACTION: AMS     Past Medical History  Diagnosis Date  . AAA (abdominal aortic aneurysm)     a. CTA 09/2011: 4.1-4.2cm infrarenal AAA.  Marland Kitchen Aneurysm of thoracic aorta 1995    a. Rupture 1995 w/spontaneous resolution;  b. CTA 09/2011: 6.8cm dist, 5.7cm mid  . Popliteal artery aneurysm   . Myocardial infarction   . Ischemic cardiomyopathy     10/2011 ECHO: EF 30-35%, apical akinesis  . Paroxysmal atrial fibrillation   . Hypertension   . Peripheral vascular disease   . Dyslipidemia   . Diabetes mellitus 2007    dx'd 2007 w/persisting FBS>125  . GERD (gastroesophageal reflux disease)   . Hiatal hernia   . Diverticular disease   . BPH (benign prostatic hypertrophy)     with hx of acute urinary retention (pt can self-cath)  . COPD (chronic obstructive pulmonary disease)     05/09/08 PFT FEV1 1.84 (55%), FVC 4.42 (95%), TLC 6.46 (92%), DLCO 72%, no BD response.  Overnight oximetry 10/2011 normal on RA.  Marland Kitchen Pneumonia 1/09  .  Allergic rhinitis   . Shingles     X 2 episodes, both in left scapula area  . OSA (obstructive sleep apnea) 06/14/09    PSG RDI 17, PLMI 96, intolerant of CPAP or BPAP  . Chronic renal insufficiency     Baseline Cr 1.5  . Prostate cancer 07/2008    Low grade; watchful waiting with [name]  . Pulmonary nodule, left 03/02/2010    Left base; resolved on f/u CT  . Allergic rhinitis   . Carpal tunnel syndrome of left wrist 02/11/2011  . IMPLANTATION OF DEFIBRILLATOR, HX OF 09/02/2008    [employer] 7232Cx Maximo V. Had a 6949 lead - failed and therapies turned off.  . IRON DEFICIENCY 06/08/2010    Hemoccult negative:  GI ([name]) recommended no invasive diagnostics--watchful waiting (2012).  . UNSPECIFIED ANEMIA 06/01/2010    Multifactorial: iron def, CRI, vit B12 def,   . Hypotension 04/14/2012  . CHF (congestive heart failure)   . Atrial fibrillation   . CAD (coronary artery disease)     post CABG with prior percutaneous intervention of the saphenous vein graft with known saphenous vein graft disease.  . Automatic implantable cardioverter-defibrillator in situ     turned off per office visit note of Dr Excell Seltzer   .  Hypothyroidism   . Shortness of breath   . Arthritis     History:  obtained from patient  Pt orientation to unit, room and routine. Information packet given to patient/family and safety video needs to be viewed.  Admission INP armband ID verified with patient, and in place. SR up x 2, fall risk assessment complete with Patient and family verbalizing understanding of risks associated with falls. Pt verbalizes an understanding of how to use the call bell and to call for help before getting out of bed.  Skin, clean-dry- intact without evidence of bruising, or skin tears.   No evidence of skin break down noted on exam.  Will cont to monitor and assist as needed.  Elisha Ponder, RN 02/24/2013 3:34 AM

## 2013-03-13 NOTE — ED Notes (Signed)
Went to room with Dr. Nicanor Alcon and spoke to patient about advance directives.  Patient explained that he does not want any life saving measures, chest compression, intubation, (see DNR form).

## 2013-03-13 NOTE — Progress Notes (Signed)
Chaplain paged to provide ministry of presence to family. Per the family and nursing, the patient passed away sooner than expected. Chaplain offered emotional and grief support.   03/07/2013 0520  Clinical Encounter Type  Visited With Family  Visit Type Initial;Spiritual support;Death;Patient actively dying  Referral From Nurse  Spiritual Encounters  Spiritual Needs Emotional;Grief support  Stress Factors  Family Stress Factors Loss

## 2013-03-13 DEATH — deceased

## 2013-04-05 ENCOUNTER — Ambulatory Visit: Payer: Medicare Other

## 2013-04-09 ENCOUNTER — Ambulatory Visit: Payer: Medicare Other

## 2013-04-28 ENCOUNTER — Ambulatory Visit: Payer: Medicare Other | Admitting: Cardiovascular Disease

## 2013-05-10 ENCOUNTER — Ambulatory Visit: Payer: Medicare Other

## 2013-06-07 ENCOUNTER — Ambulatory Visit: Payer: Medicare Other

## 2013-06-10 ENCOUNTER — Ambulatory Visit: Payer: Medicare Other

## 2013-07-05 ENCOUNTER — Ambulatory Visit: Payer: Medicare Other

## 2013-07-09 ENCOUNTER — Ambulatory Visit: Payer: Medicare Other

## 2013-08-09 ENCOUNTER — Ambulatory Visit: Payer: Medicare Other

## 2013-11-25 NOTE — Telephone Encounter (Signed)
Close encounter
